# Patient Record
Sex: Female | Born: 1965 | Race: Black or African American | Hispanic: No | Smoking: Current some day smoker
Health system: Southern US, Community
[De-identification: ages and names within clinical notes are randomized; demographics above are authoritative.]

## PROBLEM LIST (undated history)

## (undated) DIAGNOSIS — F419 Anxiety disorder, unspecified: Secondary | ICD-10-CM

## (undated) DIAGNOSIS — I340 Nonrheumatic mitral (valve) insufficiency: Secondary | ICD-10-CM

## (undated) DIAGNOSIS — J45909 Unspecified asthma, uncomplicated: Secondary | ICD-10-CM

## (undated) DIAGNOSIS — F319 Bipolar disorder, unspecified: Secondary | ICD-10-CM

## (undated) DIAGNOSIS — M1990 Unspecified osteoarthritis, unspecified site: Secondary | ICD-10-CM

## (undated) DIAGNOSIS — K219 Gastro-esophageal reflux disease without esophagitis: Secondary | ICD-10-CM

## (undated) DIAGNOSIS — T1491XA Suicide attempt, initial encounter: Secondary | ICD-10-CM

## (undated) DIAGNOSIS — F329 Major depressive disorder, single episode, unspecified: Secondary | ICD-10-CM

## (undated) DIAGNOSIS — F32A Depression, unspecified: Secondary | ICD-10-CM

## (undated) DIAGNOSIS — E119 Type 2 diabetes mellitus without complications: Secondary | ICD-10-CM

## (undated) DIAGNOSIS — F4310 Post-traumatic stress disorder, unspecified: Secondary | ICD-10-CM

## (undated) DIAGNOSIS — I10 Essential (primary) hypertension: Secondary | ICD-10-CM

## (undated) DIAGNOSIS — F1910 Other psychoactive substance abuse, uncomplicated: Secondary | ICD-10-CM

## (undated) DIAGNOSIS — K259 Gastric ulcer, unspecified as acute or chronic, without hemorrhage or perforation: Secondary | ICD-10-CM

## (undated) DIAGNOSIS — T7840XA Allergy, unspecified, initial encounter: Secondary | ICD-10-CM

## (undated) DIAGNOSIS — F209 Schizophrenia, unspecified: Secondary | ICD-10-CM

## (undated) HISTORY — DX: Other psychoactive substance abuse, uncomplicated: F19.10

## (undated) HISTORY — DX: Allergy, unspecified, initial encounter: T78.40XA

## (undated) HISTORY — PX: CYST EXCISION: SHX5701

## (undated) HISTORY — PX: TUBAL LIGATION: SHX77

## (undated) HISTORY — PX: FOOT SURGERY: SHX648

## (undated) HISTORY — DX: Gastro-esophageal reflux disease without esophagitis: K21.9

## (undated) HISTORY — PX: DILATION AND CURETTAGE OF UTERUS: SHX78

---

## 2014-03-17 ENCOUNTER — Emergency Department (HOSPITAL_COMMUNITY): Payer: No Typology Code available for payment source

## 2014-03-17 ENCOUNTER — Emergency Department (HOSPITAL_COMMUNITY)
Admission: EM | Admit: 2014-03-17 | Discharge: 2014-03-18 | Disposition: A | Discharge status: Home / Self Care | Payer: No Typology Code available for payment source | Attending: Emergency Medicine | Admitting: Emergency Medicine

## 2014-03-17 ENCOUNTER — Encounter (HOSPITAL_COMMUNITY): Payer: Self-pay | Admitting: Emergency Medicine

## 2014-03-17 DIAGNOSIS — Y92410 Unspecified street and highway as the place of occurrence of the external cause: Secondary | ICD-10-CM | POA: Insufficient documentation

## 2014-03-17 DIAGNOSIS — J45909 Unspecified asthma, uncomplicated: Secondary | ICD-10-CM | POA: Insufficient documentation

## 2014-03-17 DIAGNOSIS — F17200 Nicotine dependence, unspecified, uncomplicated: Secondary | ICD-10-CM | POA: Insufficient documentation

## 2014-03-17 DIAGNOSIS — S40012A Contusion of left shoulder, initial encounter: Secondary | ICD-10-CM

## 2014-03-17 DIAGNOSIS — Y9389 Activity, other specified: Secondary | ICD-10-CM | POA: Insufficient documentation

## 2014-03-17 DIAGNOSIS — S40019A Contusion of unspecified shoulder, initial encounter: Secondary | ICD-10-CM | POA: Insufficient documentation

## 2014-03-17 DIAGNOSIS — Z8659 Personal history of other mental and behavioral disorders: Secondary | ICD-10-CM | POA: Insufficient documentation

## 2014-03-17 DIAGNOSIS — IMO0002 Reserved for concepts with insufficient information to code with codable children: Secondary | ICD-10-CM | POA: Insufficient documentation

## 2014-03-17 HISTORY — DX: Bipolar disorder, unspecified: F31.9

## 2014-03-17 HISTORY — DX: Post-traumatic stress disorder, unspecified: F43.10

## 2014-03-17 HISTORY — DX: Unspecified asthma, uncomplicated: J45.909

## 2014-03-17 IMAGING — CR DG SHOULDER 2+V*L*
4 series · 4 of 4 positions shown · non-contrast
Comparison: None.

CLINICAL DATA: Anterior shoulder pain

EXAM:
LEFT SHOULDER - 2+ VIEW

[w shoulder internal left]
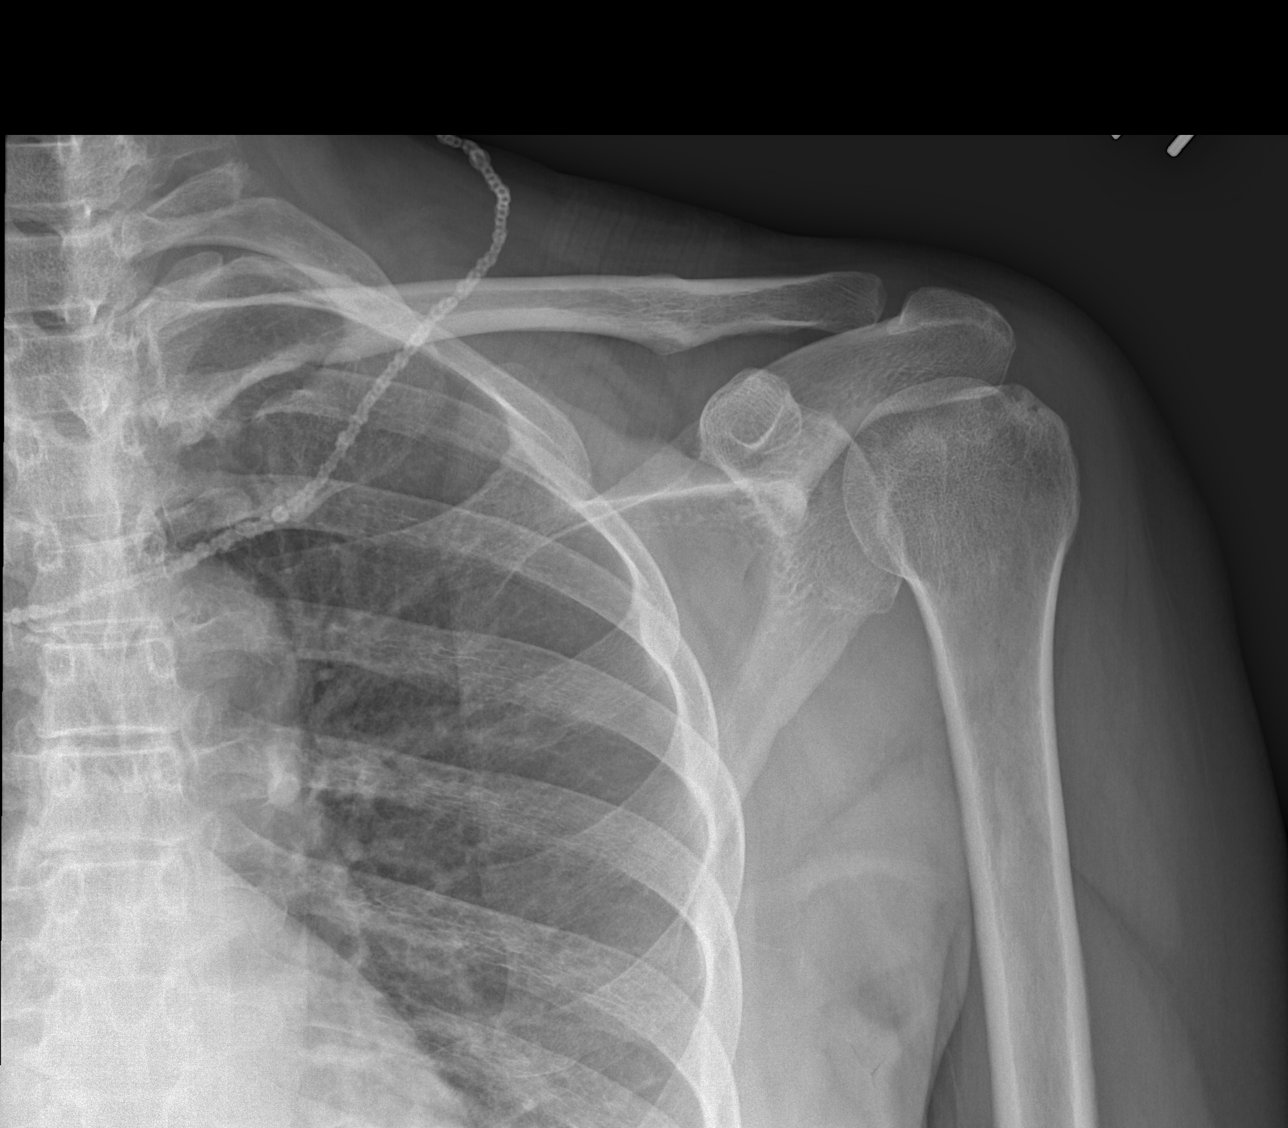

[w shoulder external left]
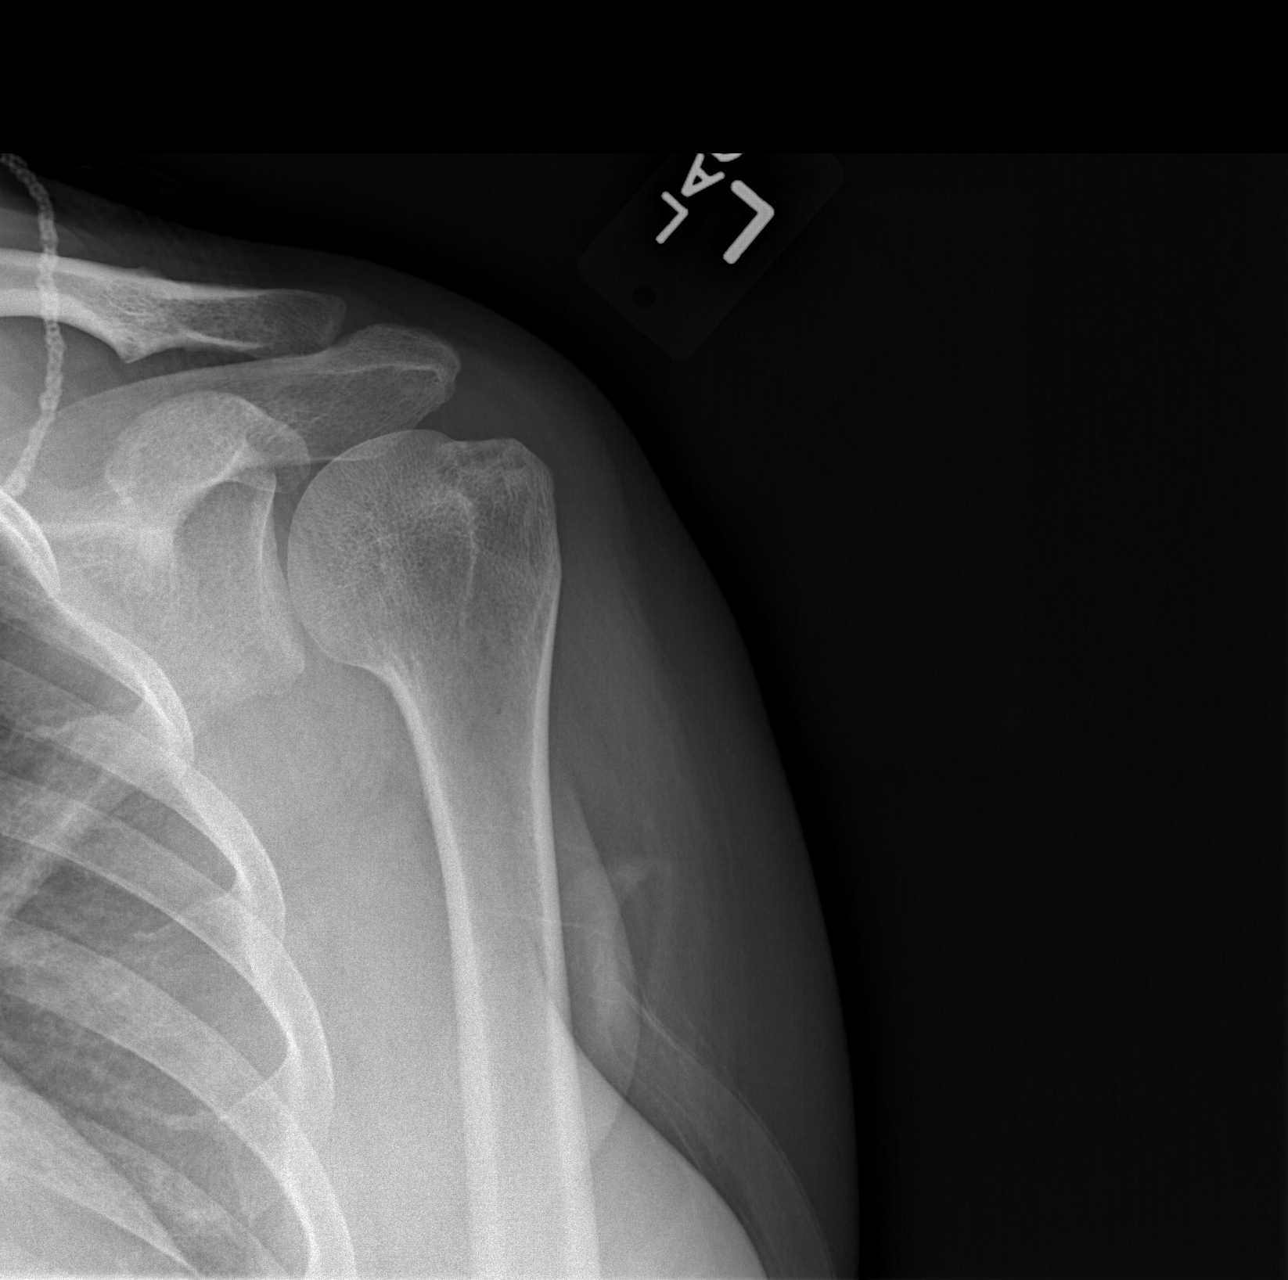

[w shoulder y-view left]
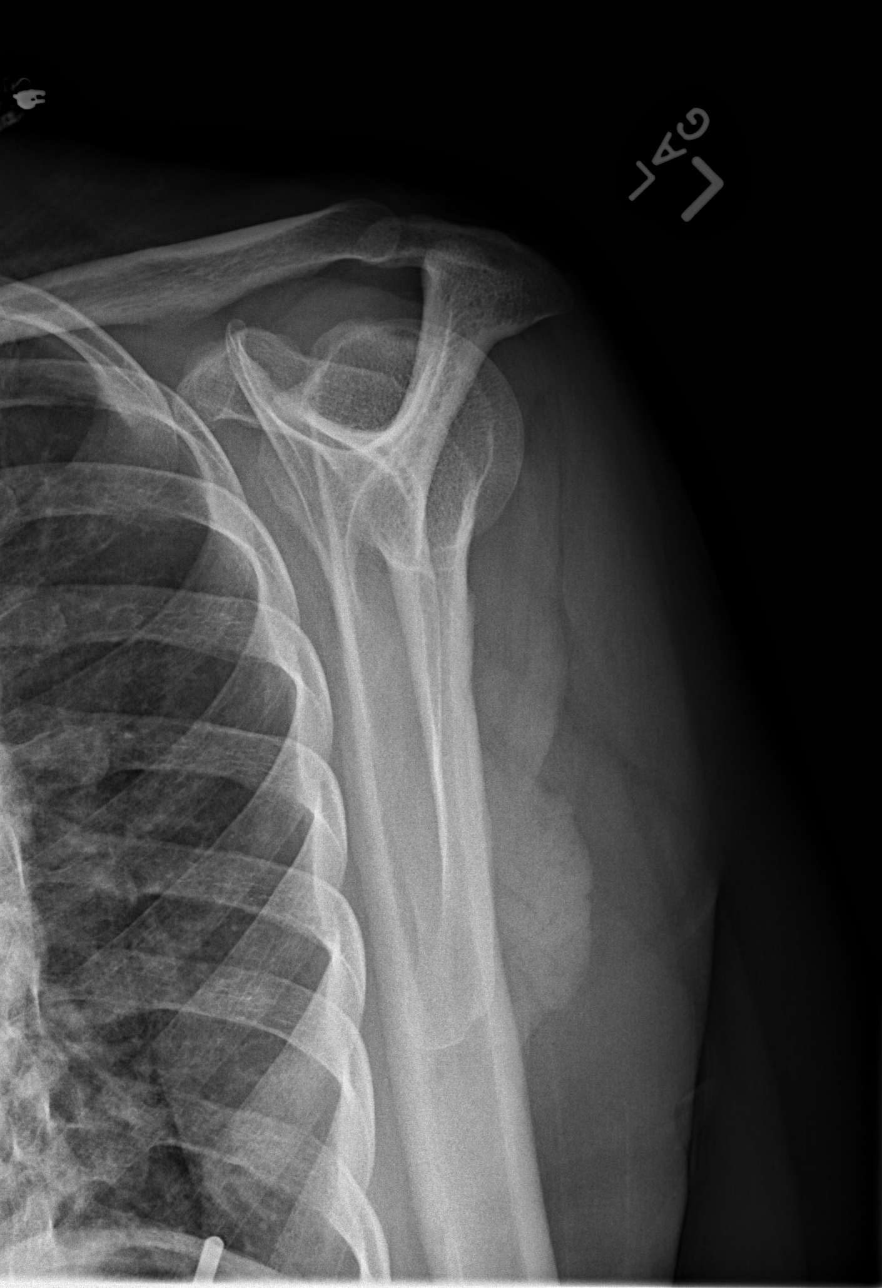

[x shoulder axillary left]
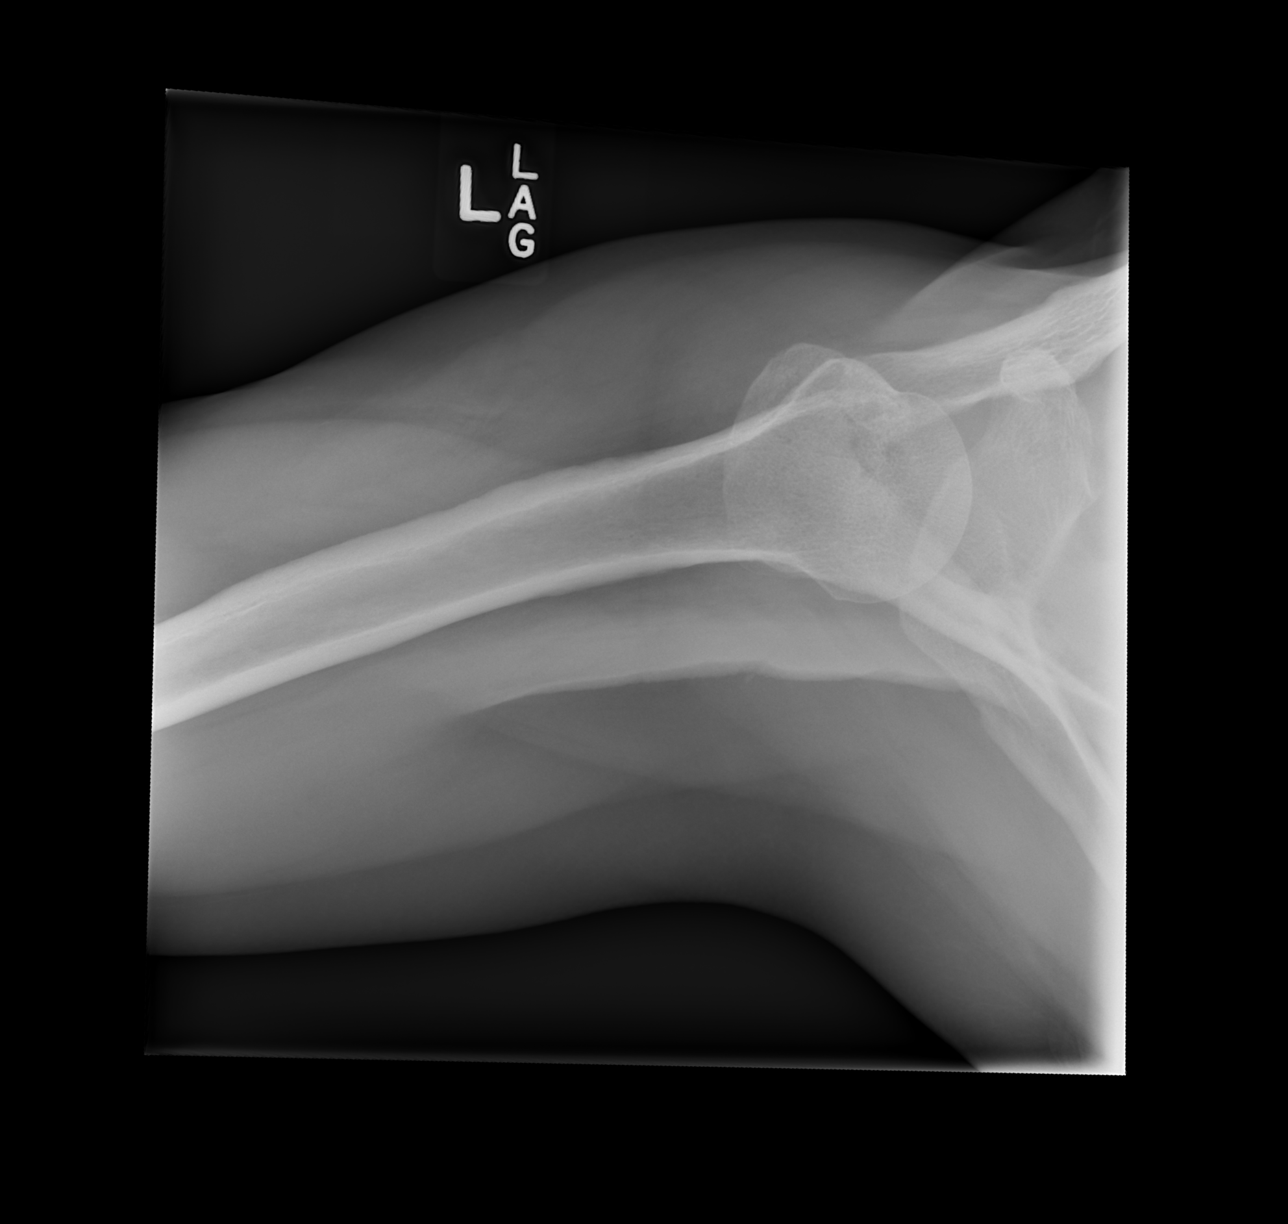

[4 of 4 positions shown; findings below may reference images not displayed]

FINDINGS: There is no evidence of fracture or dislocation. There is no
evidence of arthropathy or other focal bone abnormality. Soft
tissues are unremarkable.
IMPRESSION: No acute osseous injury of the left shoulder.

## 2014-03-17 MED ORDER — IBUPROFEN 800 MG PO TABS
800.0000 mg | ORAL_TABLET | Freq: Once | ORAL | Status: AC
  Administered 2014-03-17: 800 mg via ORAL
  Filled 2014-03-17: qty 1
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
## 2014-03-18 NOTE — Discharge Instructions (Signed)
Your x-rays did not show any signs of broken bones or other concerning injury. Rest her arm. Followup with a primary care provider for continued evaluation and treatment.    Contusion A contusion is a deep bruise. Contusions are the result of an injury that caused bleeding under the skin. The contusion may turn blue, purple, or yellow. Minor injuries will give you a painless contusion, but more severe contusions may stay painful and swollen for a few weeks.  CAUSES  A contusion is usually caused by a blow, trauma, or direct force to an area of the body. SYMPTOMS   Swelling and redness of the injured area.  Bruising of the injured area.  Tenderness and soreness of the injured area.  Pain. DIAGNOSIS  The diagnosis can be made by taking a history and physical exam. An X-ray, CT scan, or MRI may be needed to determine if there were any associated injuries, such as fractures. TREATMENT  Specific treatment will depend on what area of the body was injured. In general, the best treatment for a contusion is resting, icing, elevating, and applying cold compresses to the injured area. Over-the-counter medicines may also be recommended for pain control. Ask your caregiver what the best treatment is for your contusion. HOME CARE INSTRUCTIONS   Put ice on the injured area.  Put ice in a plastic bag.  Place a towel between your skin and the bag.  Leave the ice on for 15-20 minutes, 03-04 times a day.  Only take over-the-counter or prescription medicines for pain, discomfort, or fever as directed by your caregiver. Your caregiver may recommend avoiding anti-inflammatory medicines (aspirin, ibuprofen, and naproxen) for 48 hours because these medicines may increase bruising.  Rest the injured area.  If possible, elevate the injured area to reduce swelling. SEEK IMMEDIATE MEDICAL CARE IF:   You have increased bruising or swelling.  You have pain that is getting worse.  Your swelling or pain is  not relieved with medicines. MAKE SURE YOU:   Understand these instructions.  Will watch your condition.  Will get help right away if you are not doing well or get worse. Document Released: 08/07/2005 Document Revised: 01/20/2012 Document Reviewed: 09/02/2011 [facility] Patient Information 2014 [place], [place].      Motor Vehicle Collision  It is common to have multiple bruises and sore muscles after a motor vehicle collision (MVC). These tend to feel worse for the first 24 hours. You may have the most stiffness and soreness over the first several hours. You may also feel worse when you wake up the first morning after your collision. After this point, you will usually begin to improve with each day. The speed of improvement often depends on the severity of the collision, the number of injuries, and the location and nature of these injuries. HOME CARE INSTRUCTIONS   Put ice on the injured area.  Put ice in a plastic bag.  Place a towel between your skin and the bag.  Leave the ice on for 15-20 minutes, 03-04 times a day.  Drink enough fluids to keep your urine clear or pale yellow. Do not drink alcohol.  Take a warm shower or bath once or twice a day. This will increase blood flow to sore muscles.  You may return to activities as directed by your caregiver. Be careful when lifting, as this may aggravate neck or back pain.  Only take over-the-counter or prescription medicines for pain, discomfort, or fever as directed by your caregiver. Do not use  aspirin. This may increase bruising and bleeding. SEEK IMMEDIATE MEDICAL CARE IF:  You have numbness, tingling, or weakness in the arms or legs.  You develop severe headaches not relieved with medicine.  You have severe neck pain, especially tenderness in the middle of the back of your neck.  You have changes in bowel or bladder control.  There is increasing pain in any area of the body.  You have shortness of breath,  lightheadedness, dizziness, or fainting.  You have chest pain.  You feel sick to your stomach (nauseous), throw up (vomit), or sweat.  You have increasing abdominal discomfort.  There is blood in your urine, stool, or vomit.  You have pain in your shoulder (shoulder strap areas).  You feel your symptoms are getting worse. MAKE SURE YOU:   Understand these instructions.  Will watch your condition.  Will get help right away if you are not doing well or get worse. Document Released: 10/28/2005 Document Revised: 01/20/2012 Document Reviewed: 03/27/2011 [facility] Patient Information 2014 [place], [place].

## 2014-03-19 NOTE — ED Provider Notes (Signed)
Medical screening examination/treatment/procedure(s) were performed by non-physician practitioner and as supervising physician I was immediately available for consultation/collaboration.  Richarda Blade, MD 03/19/14 [phone]

## 2014-08-14 ENCOUNTER — Emergency Department (HOSPITAL_COMMUNITY)
Admission: EM | Admit: 2014-08-14 | Discharge: 2014-08-14 | Disposition: A | Discharge status: Home / Self Care | Payer: Self-pay | Attending: Emergency Medicine | Admitting: Emergency Medicine

## 2014-08-14 ENCOUNTER — Emergency Department (HOSPITAL_COMMUNITY): Payer: No Typology Code available for payment source

## 2014-08-14 ENCOUNTER — Encounter (HOSPITAL_COMMUNITY): Payer: Self-pay | Admitting: Emergency Medicine

## 2014-08-14 DIAGNOSIS — Y9289 Other specified places as the place of occurrence of the external cause: Secondary | ICD-10-CM | POA: Insufficient documentation

## 2014-08-14 DIAGNOSIS — F319 Bipolar disorder, unspecified: Secondary | ICD-10-CM | POA: Insufficient documentation

## 2014-08-14 DIAGNOSIS — S92401A Displaced unspecified fracture of right great toe, initial encounter for closed fracture: Secondary | ICD-10-CM

## 2014-08-14 DIAGNOSIS — S92421A Displaced fracture of distal phalanx of right great toe, initial encounter for closed fracture: Secondary | ICD-10-CM | POA: Insufficient documentation

## 2014-08-14 DIAGNOSIS — Z79899 Other long term (current) drug therapy: Secondary | ICD-10-CM | POA: Insufficient documentation

## 2014-08-14 DIAGNOSIS — J45909 Unspecified asthma, uncomplicated: Secondary | ICD-10-CM | POA: Insufficient documentation

## 2014-08-14 DIAGNOSIS — W2201XA Walked into wall, initial encounter: Secondary | ICD-10-CM | POA: Insufficient documentation

## 2014-08-14 DIAGNOSIS — Y9389 Activity, other specified: Secondary | ICD-10-CM | POA: Insufficient documentation

## 2014-08-14 DIAGNOSIS — Z720 Tobacco use: Secondary | ICD-10-CM | POA: Insufficient documentation

## 2014-08-14 MED ORDER — OXYCODONE-ACETAMINOPHEN 5-325 MG PO TABS: 1.0000 | ORAL_TABLET | Freq: Four times a day (QID) | ORAL | Status: DC | PRN

## 2014-08-14 MED ORDER — IBUPROFEN 800 MG PO TABS: 800.0000 mg | ORAL_TABLET | Freq: Three times a day (TID) | ORAL | Status: DC

## 2014-08-14 MED ORDER — OXYCODONE-ACETAMINOPHEN 5-325 MG PO TABS
1.0000 | ORAL_TABLET | Freq: Once | ORAL | Status: AC
  Administered 2014-08-14: 1 via ORAL
  Filled 2014-08-14: qty 1

## 2014-08-14 MED ORDER — ONDANSETRON 4 MG PO TBDP
4.0000 mg | ORAL_TABLET | Freq: Once | ORAL | Status: AC
  Administered 2014-08-14: 4 mg via ORAL
  Filled 2014-08-14: qty 1

## 2014-08-14 NOTE — Discharge Instructions (Signed)

## 2014-08-14 NOTE — ED Notes (Signed)
Pt reports her daughter will be driving her home

## 2014-08-14 NOTE — ED Notes (Signed)
Pt reports she was playing with her grandkids when she hit her right toe (Hallux) on the corner of the wall. No bruising or swelling noted, +sensation and she is able to wiggle her toes.

## 2014-08-14 NOTE — ED Provider Notes (Signed)
CSN: [id number]     Arrival date & time 08/14/14  2106 History  This chart was scribed for non-physician practitioner, Randa Lynn. Carlota Raspberry, PA-C working with Malvin Johns, MD by Tula Nakayama, ED scribe. This patient was seen in room TR05C/TR05C and the patient's care was started at 9:28 PM.  Chief Complaint  Patient presents with  . Toe Injury   The history is provided by the patient. No language interpreter was used.   HPI Comments: Abuk Selleck is a 48 y.o. female who presents to the Emergency Department complaining of severe, dorsal right great toe pain with sharp, intermittent stabbing sensations after hitting it on the corner of a wall while playing with her grandchildren 2 hours ago. Pt notes not being able to flex her right great toe. She states a tingling sensation at the very tip of great toe as an associated symptom.    Past Medical History  Diagnosis Date  . Asthma   . Post traumatic stress disorder   . Bipolar 1 disorder    History reviewed. No pertinent past surgical history. No family history on file. History  Substance Use Topics  . Smoking status: Current Every Day Smoker  . Smokeless tobacco: Not on file  . Alcohol Use: Yes   OB History   Grav Para Term Preterm Abortions TAB SAB Ect Mult Living                 Review of Systems  All other systems reviewed and are negative.     Allergies  Aspirin and Other  Home Medications   Prior to Admission medications   Medication Sig Start Date End Date Taking? Authorizing Provider  albuterol (PROVENTIL HFA;VENTOLIN HFA) 108 (90 BASE) MCG/ACT inhaler Inhale 2 puffs into the lungs every 6 (six) hours as needed for wheezing or shortness of breath.    Historical Provider, MD  hydrOXYzine (ATARAX/VISTARIL) 50 MG tablet Take 50 mg by mouth 3 (three) times daily as needed.    Historical Provider, MD  ibuprofen (ADVIL,MOTRIN) 800 MG tablet Take 1 tablet (800 mg total) by mouth 3 (three) times daily. 08/14/14   Linus Mako, PA-C  lithium carbonate 300 MG capsule Take 900 mg by mouth at bedtime.    Historical Provider, MD  mometasone (NASONEX) 50 MCG/ACT nasal spray Place 1 spray into both nostrils daily.    Historical Provider, MD  oxyCODONE-acetaminophen (PERCOCET/ROXICET) 5-325 MG per tablet Take 1-2 tablets by mouth every 6 (six) hours as needed. 08/14/14   Ruben Mahler Marilu Favre, PA-C  traZODone (DESYREL) 100 MG tablet Take 200 mg by mouth at bedtime.    Historical Provider, MD   BP 108/83  Pulse 98  Temp(Src) 99 F (37.2 C) (Oral)  Resp 18  SpO2 95% Physical Exam  Nursing note and vitals reviewed. Constitutional: She appears well-developed and well-nourished. No distress.  HENT:  Head: Normocephalic and atraumatic.  Eyes: Conjunctivae and EOM are normal.  Neck: Neck supple. No tracheal deviation present.  Cardiovascular: Normal rate.   Pulmonary/Chest: Effort normal. No respiratory distress.  Musculoskeletal:       Right foot: She exhibits decreased range of motion, tenderness, bony tenderness, swelling and crepitus. She exhibits normal capillary refill, no deformity and no laceration.       Feet:  Skin: Skin is warm and dry.  Psychiatric: She has a normal mood and affect. Her behavior is normal.    ED Course  Procedures (including critical care time) DIAGNOSTIC STUDIES:   COORDINATION OF  CARE: 3:20 PM Discussed treatment plan with pt at bedside and pt agreed to plan.    Labs Review Labs Reviewed - No data to display  Imaging Review No results found.   EKG Interpretation None          DG Toe Great Right (Final result)  Result time: 08/14/14 22:21:12    Final result by Rad Results In Interface (08/14/14 22:21:12)    Narrative:   CLINICAL DATA: Stubbed right great toe against wall today, with diffuse throbbing pain in the right great toe, worse about the distal aspect of the toe. Acute injury; initial encounter.  EXAM: RIGHT GREAT TOE  COMPARISON:  None.  FINDINGS: There is a nearly nondisplaced fracture through the proximal aspect of the first distal phalanx, without definite evidence of intra-articular extension. No additional fractures are identified. Visualized joint spaces are preserved. Mild soft tissue swelling is suggested about the first toe.  IMPRESSION: Nearly nondisplaced fracture noted through the proximal aspect of the first distal phalanx, without definite evidence of intra-articular extension.   Electronically Signed By: Garald Balding M.D. On: 08/14/2014 22:21     MDM   Final diagnoses:  Fracture of great toe, right, closed, initial encounter   Patient to the ER and has sustained a great toe fracture. Discussed with attending and we feel that a post op boot would provider the greatest amount of support. I discussed the importance of following up with the Orthopedist that she will be referred to has this is a weight baring bone and the need to decrease complications is important.  48 y.o.Deva Archer's evaluation in the Emergency Department is complete. It has been determined that no acute conditions requiring further emergency intervention are present at this time. The patient/guardian have been advised of the diagnosis and plan. We have discussed signs and symptoms that warrant return to the ED, such as changes or worsening in symptoms.  Vital signs are stable at discharge. Filed Vitals:   08/14/14 2301  BP: 108/83  Pulse: 98  Temp:   Resp: 18    Patient/guardian has voiced understanding and agreed to follow-up with the PCP or specialist.   I personally performed the services described in this documentation, which was scribed in my presence. The recorded information has been reviewed and is accurate.     Linus Mako, PA-C 08/25/14 1523

## 2014-08-26 NOTE — ED Provider Notes (Signed)
Medical screening examination/treatment/procedure(s) were performed by non-physician practitioner and as supervising physician I was immediately available for consultation/collaboration.   EKG Interpretation None        Malvin Johns, MD 08/26/14 [phone]

## 2015-02-27 ENCOUNTER — Encounter (HOSPITAL_COMMUNITY): Payer: Self-pay

## 2015-02-27 ENCOUNTER — Emergency Department (HOSPITAL_COMMUNITY)
Admission: EM | Admit: 2015-02-27 | Discharge: 2015-02-27 | Disposition: A | Discharge status: Home / Self Care | Payer: Self-pay | Attending: Emergency Medicine | Admitting: Emergency Medicine

## 2015-02-27 DIAGNOSIS — R21 Rash and other nonspecific skin eruption: Secondary | ICD-10-CM

## 2015-02-27 DIAGNOSIS — L918 Other hypertrophic disorders of the skin: Secondary | ICD-10-CM | POA: Insufficient documentation

## 2015-02-27 DIAGNOSIS — Z720 Tobacco use: Secondary | ICD-10-CM | POA: Insufficient documentation

## 2015-02-27 DIAGNOSIS — Q828 Other specified congenital malformations of skin: Secondary | ICD-10-CM

## 2015-02-27 DIAGNOSIS — J45909 Unspecified asthma, uncomplicated: Secondary | ICD-10-CM | POA: Insufficient documentation

## 2015-02-27 DIAGNOSIS — Z7951 Long term (current) use of inhaled steroids: Secondary | ICD-10-CM | POA: Insufficient documentation

## 2015-02-27 DIAGNOSIS — L739 Follicular disorder, unspecified: Secondary | ICD-10-CM | POA: Insufficient documentation

## 2015-02-27 DIAGNOSIS — F319 Bipolar disorder, unspecified: Secondary | ICD-10-CM | POA: Insufficient documentation

## 2015-02-27 DIAGNOSIS — Z79899 Other long term (current) drug therapy: Secondary | ICD-10-CM | POA: Insufficient documentation

## 2015-02-27 MED ORDER — CEPHALEXIN 500 MG PO CAPS: 500.0000 mg | ORAL_CAPSULE | Freq: Four times a day (QID) | ORAL | Status: DC

## 2015-02-27 MED ORDER — DIPHENHYDRAMINE HCL 25 MG PO TABS: 25.0000 mg | ORAL_TABLET | Freq: Three times a day (TID) | ORAL | Status: DC | PRN

## 2015-02-27 NOTE — ED Notes (Signed)
Pt started 1 week ago with bilateral axillary irritation. Woke this am with worsening irritation and swelling.

## 2015-02-27 NOTE — ED Notes (Signed)
Pt has noticed pain and burning to bilateral axilla for about a week now. No hx of abscesses.

## 2015-02-27 NOTE — ED Provider Notes (Signed)
CSN: [id number]     Arrival date & time 02/27/15  1142 History  This chart was scribed for Jamse Mead PA-C working with Wandra Arthurs, MD by Mercy Moore, ED Scribe. This patient was seen in room TR01C/TR01C and the patient's care was started at 1:33 PM.   Chief Complaint  Patient presents with  . Abscess   The history is provided by the patient. No language interpreter was used.   HPI Comments: Denise Knight is a 49 y.o. female with past medical history of asthma, PTSD, bipolar disorder who presents to the Emergency Department complaining of burning, itching left axillary pain ongoing for one week now. Patient reports sweaty discharge and pus from the site upon waking up this morning. Patient denies history of abscess. Secondarily patient reports two painful skin tags to her right axilla. Patient reports family history of these benign skin tags. Patient reports treatment to both regions with peroxide and cortisone cream, without relief. Patient reports shaving her armpits once a month. Denied fever, neck pain, neck stiffness, travels, chest pain, shortness of breath, difficulty breathing, headache, dizziness, fainting, red streaks, arm swelling, throat closing sensation. Denied history of skin cancer in the family. Patient do not have PCP here in [place], but she travels to see her doctor in her hometown.   Past Medical History  Diagnosis Date  . Asthma   . Post traumatic stress disorder   . Bipolar 1 disorder    History reviewed. No pertinent past surgical history. No family history on file. History  Substance Use Topics  . Smoking status: Current Every Day Smoker  . Smokeless tobacco: Not on file  . Alcohol Use: Yes   OB History    No data available     Review of Systems  Constitutional: Negative for fever and chills.  Gastrointestinal: Negative for nausea, vomiting and abdominal pain.  Skin: Positive for rash.       Skin tags  Neurological: Negative for syncope.       Allergies  Aspirin and Other  Home Medications   Prior to Admission medications   Medication Sig Start Date End Date Taking? Authorizing Provider  albuterol (PROVENTIL HFA;VENTOLIN HFA) 108 (90 BASE) MCG/ACT inhaler Inhale 2 puffs into the lungs every 6 (six) hours as needed for wheezing or shortness of breath.    Historical Provider, MD  cephALEXin (KEFLEX) 500 MG capsule Take 1 capsule (500 mg total) by mouth 4 (four) times daily. 02/27/15   Shivonne Schwartzman, PA-C  diphenhydrAMINE (BENADRYL) 25 MG tablet Take 1 tablet (25 mg total) by mouth every 8 (eight) hours as needed for itching. 02/27/15   Gerrald Basu, PA-C  hydrOXYzine (ATARAX/VISTARIL) 50 MG tablet Take 50 mg by mouth 3 (three) times daily as needed.    Historical Provider, MD  ibuprofen (ADVIL,MOTRIN) 800 MG tablet Take 1 tablet (800 mg total) by mouth 3 (three) times daily. 08/14/14   Delos Haring, PA-C  lithium carbonate 300 MG capsule Take 900 mg by mouth at bedtime.    Historical Provider, MD  mometasone (NASONEX) 50 MCG/ACT nasal spray Place 1 spray into both nostrils daily.    Historical Provider, MD  oxyCODONE-acetaminophen (PERCOCET/ROXICET) 5-325 MG per tablet Take 1-2 tablets by mouth every 6 (six) hours as needed. 08/14/14   Tiffany Carlota Raspberry, PA-C  traZODone (DESYREL) 100 MG tablet Take 200 mg by mouth at bedtime.    Historical Provider, MD   Triage Vitals: BP 147/84 mmHg  Pulse 89  Temp(Src) 97.4 F (36.3 C) (  Oral)  Resp 20  Ht 5' 9"  (1.753 m)  Wt 187 lb (84.823 kg)  BMI 27.60 kg/m2  SpO2 99% Physical Exam  Constitutional: She is oriented to person, place, and time. She appears well-developed and well-nourished. No distress.  HENT:  Head: Normocephalic and atraumatic.  Mouth/Throat: Oropharynx is clear and moist. No oropharyngeal exudate.  Eyes: Conjunctivae and EOM are normal. Pupils are equal, round, and reactive to light. Right eye exhibits no discharge. Left eye exhibits no discharge.  Neck: Normal  range of motion. Neck supple. No tracheal deviation present.  Cardiovascular: Normal rate, regular rhythm and normal heart sounds.  Exam reveals no friction rub.   No murmur heard. Pulses:      Radial pulses are 2+ on the right side, and 2+ on the left side.  Pulmonary/Chest: Effort normal and breath sounds normal. No respiratory distress. She has no wheezes. She has no rales.  Patient stable to speak in full sentences that difficulty Negative use of accessory muscles Negative stridor  Musculoskeletal: Normal range of motion.  Full ROM to upper and lower extremities without difficulty noted, negative ataxia noted.  Lymphadenopathy:    She has no cervical adenopathy.  Neurological: She is alert and oriented to person, place, and time. No cranial nerve deficit. She exhibits normal muscle tone. Coordination normal.  Equal grip strength bilaterally  Skin: Skin is warm and dry. Rash noted. She is not diaphoretic. There is erythema.  Axilla bilaterally identified to be mildly erythematous with escortion of the skin secondary for patient itching. Clear fluid identified. Negative fluctuance or induration identified. Negative active drainage of pus noted. Negative active drainage of bleeding. Negative red streaks running down the arm. Negative arm swelling noted.  Psychiatric: She has a normal mood and affect. Her behavior is normal. Thought content normal.  Nursing note and vitals reviewed.   ED Course  Procedures (including critical care time)  COORDINATION OF CARE: 1:43 PM- Discussed treatment plan with patient at bedside and patient agreed to plan.   Labs Review Labs Reviewed - No data to display  Imaging Review No results found.   EKG Interpretation None      MDM   Final diagnoses:  Folliculitis  Rash and nonspecific skin eruption  Accessory skin tags    Medications - No data to display  Filed Vitals:   02/27/15 1158  BP: 147/84  Pulse: 89  Temp: 97.4 F (36.3 C)   TempSrc: Oral  Resp: 20  Height: 5' 9"  (1.753 m)  Weight: 187 lb (84.823 kg)  SpO2: 99%   I personally performed the services described in this documentation, which was scribed in my presence. The recorded information has been reviewed and is accurate.  Patient presenting to the ED with bilateral axilla rash that started approximately one week ago. Rash is localized to the axillas bilaterally identified to be an erythematous face with a score of the skin secondary to the patient itching. Patient reports that the skin is burning, itching is been continuous. Negative active drainage or bleeding noted at this time. Negative palpation of induration or fluctuance. No appreciable abscess noted be drained at this time. Definitive etiology unknown, possible bacterial versus fungal infection. Negative signs of lymphangitis. Patient stable, afebrile. Patient not septic appearing. Negative signs of respiratory distress. Doubt anaphylactic reaction. Suspicion to be possible folliculitis, will treat patient with antibiotic first-discussed with patient that if antibiotics do not work this may be fungal and to report back to the ED for assessment. Discharged  patient. Discharge patient with Keflex. Discharge patient with a small dose of Benadryl for itching, discussed with patient course and precautions-discussed with patient that she cannot take Benadryl with Vistaril, patient understood and comprehended. Discussed with patient to rest and stay hydrated. Discussed with patient to avoid any physical or strenuous activity. Discussed with patient to apply cool compressions. Discussed with patient to avoid using peroxide, recommended non-scented powder to keep the site dry. Referred patient to health and wellness Center and dermatologist-discussed with patient and highly recommended patient to be seen by dermatologist regarding skin tags, recommended biopsy to be performed on the skin tags. Discussed with patient to closely  monitor symptoms and if symptoms are to worsen or change to report back to the ED - strict return instructions given.  Patient agreed to plan of care, understood, all questions answered.   Jamse Mead, PA-C 02/27/15 1434  Wandra Arthurs, MD 02/27/15 [phone]

## 2015-02-27 NOTE — Discharge Instructions (Signed)
Please call your doctor for a followup appointment within 24-48 hours. When you talk to your doctor please let them know that you were seen in the emergency department and have them acquire all of your records so that they can discuss the findings with you and formulate a treatment plan to fully care for your new and ongoing problems. Please follow-up with health and wellness Center Please follow-up with dermatologist regarding skin tags in the armpits-highly recommended assessment and possible biopsy of these skin tags Please take antibiotics as prescribed While taking Benadryl this can cause drowsiness, please do not drink alcohol, drive, operate any heavy machinery while taking Benadryl. Please no more than 100 mg per day. While taking Benadryl you CANNOT take Vistaril Please keep site dry Please apply cool compressions for comfort purposes Please continue to monitor symptoms closely and if symptoms are to worsen or change (fever greater than 101, chills, sweating, nausea, vomiting, chest pain, shortness of breathe, difficulty breathing, weakness, numbness, tingling, worsening or changes to pain pattern, drainage, abscess formation, increased swelling to the arms, red streaks running down the arms, loss of sensation to the arms, weakness, tongue swelling, throat closing sensation, neck pain, neck stiffness) please report back to the Emergency Department immediately.   Folliculitis  Folliculitis is redness, soreness, and swelling (inflammation) of the hair follicles. This condition can occur anywhere on the body. People with weakened immune systems, diabetes, or obesity have a greater risk of getting folliculitis. CAUSES  Bacterial infection. This is the most common cause.  Fungal infection.  Viral infection.  Contact with certain chemicals, especially oils and tars. Long-term folliculitis can result from bacteria that live in the nostrils. The bacteria may trigger multiple outbreaks of  folliculitis over time. SYMPTOMS Folliculitis most commonly occurs on the scalp, thighs, legs, back, buttocks, and areas where hair is shaved frequently. An early sign of folliculitis is a small, white or yellow, pus-filled, itchy lesion (pustule). These lesions appear on a red, inflamed follicle. They are usually less than 0.2 inches (5 mm) wide. When there is an infection of the follicle that goes deeper, it becomes a boil or furuncle. A group of closely packed boils creates a larger lesion (carbuncle). Carbuncles tend to occur in hairy, sweaty areas of the body. DIAGNOSIS  Your caregiver can usually tell what is wrong by doing a physical exam. A sample may be taken from one of the lesions and tested in a lab. This can help determine what is causing your folliculitis. TREATMENT  Treatment may include:  Applying warm compresses to the affected areas.  Taking antibiotic medicines orally or applying them to the skin.  Draining the lesions if they contain a large amount of pus or fluid.  Laser hair removal for cases of long-lasting folliculitis. This helps to prevent regrowth of the hair. HOME CARE INSTRUCTIONS  Apply warm compresses to the affected areas as directed by your caregiver.  If antibiotics are prescribed, take them as directed. Finish them even if you start to feel better.  You may take over-the-counter medicines to relieve itching.  Do not shave irritated skin.  Follow up with your caregiver as directed. SEEK IMMEDIATE MEDICAL CARE IF:   You have increasing redness, swelling, or pain in the affected area.  You have a fever. MAKE SURE YOU:  Understand these instructions.  Will watch your condition.  Will get help right away if you are not doing well or get worse. Document Released: 01/06/2002 Document Revised: 04/28/2012 Document Reviewed: 01/28/2012 ExitCare  Patient Information 2015 [place]. This information is not intended to replace advice given to you by  your health care provider. Make sure you discuss any questions you have with your health care provider.   Emergency Department Resource Guide 1) Find a Doctor and Pay Out of Pocket Although you won't have to find out who is covered by your insurance plan, it is a good idea to ask around and get recommendations. You will then need to call the office and see if the doctor you have chosen will accept you as a new patient and what types of options they offer for patients who are self-pay. Some doctors offer discounts or will set up payment plans for their patients who do not have insurance, but you will need to ask so you aren't surprised when you get to your appointment.  2) Contact Your Local Health Department Not all health departments have doctors that can see patients for sick visits, but many do, so it is worth a call to see if yours does. If you don't know where your local health department is, you can check in your phone book. The CDC also has a tool to help you locate your state's health department, and many state websites also have listings of all of their local health departments.  3) Find a [facility] If your illness is not likely to be very severe or complicated, you may want to try a walk in clinic. These are popping up all over the country in pharmacies, drugstores, and shopping centers. They're usually staffed by nurse practitioners or physician assistants that have been trained to treat common illnesses and complaints. They're usually fairly quick and inexpensive. However, if you have serious medical issues or chronic medical problems, these are probably not your best option.  No Primary Care Doctor: - Call Health Connect at  [phone] - they can help you locate a primary care doctor that  accepts your insurance, provides certain services, etc. - Physician Referral Service- [phone]  Chronic Pain Problems: Organization         Address  Phone   Notes  [facility]  [phone] Patients need to be referred by their primary care doctor.   Medication Assistance: Organization         Address  Phone   Notes  [facility] Medication [facility] [address] [place], [place] [address] [phone] --Must be a resident of [facility] -- Must have NO insurance coverage whatsoever (no Medicaid/ Medicare, etc.) -- The pt. MUST have a primary care doctor that directs their care regularly and follows them in the community   MedAssist  [phone]   [employer]  [phone]    Agencies that provide inexpensive medical care: Organization         Address  Phone   Notes  [place]  [phone]   [facility]    [phone]   [facility] [place], [place] [address] [phone]   [place] [address] [place] [phone]   Planned Parenthood    [phone]   [facility]    [phone]   [place] and [place] [address], [place] Phone:  [phone], Fax:  [phone] Hours of Operation:  9 am - 6 pm, M-F.  Also accepts Medicaid/Medicare and self-pay.  [facility]  for Children  [address], [place] Phone: [phone], Fax: [phone]. Hours of Operation:  8:30 am - 5:30 pm, M-F.  Also accepts Medicaid and self-pay.  [facility] [address] [place] Phone: [phone]   [place], [place], [place] [phone], Ext. [phone] Mondays & Thursdays: 7-9 AM.  First 15 patients are seen on a first come, first serve basis.    [place] Providers:  Organization         Address  Phone   Notes  [facility] [address],  [phone] Also accepts self-pay patients.  [facility] [address], [place]  [phone]   [place], [address], [place] [phone]   [facility] [address] [place] [phone]   Lucianne Lei [address], [place]   [phone] Only accepts [place] Access [place] patients after they have their name applied to their card.   Self-Pay (no insurance) in [facility]:  Organization         Address  Phone   Notes  Sickle Cell Patients, [facility] Internal [facility] [place] [phone]   [facility] [place] [phone]   [facility]  [place], [place], [place] [phone]   [facility]/Dr. Osei-Bonsu  [address] [place] or [address], [place] [phone] Phone number for both [place] and [place] locations is the same.  Urgent Medical and [facility] [address], [place] [phone]   [facility] [address], [place] or [address] [phone]   [facility] [address], [place] [phone], phone; [phone], fax Sees patients 1st and 3rd Saturday of every month.  Must not qualify for public or private insurance (i.e. Medicaid, Medicare, [place] Health Choice, Veterans' Benefits)  Household income should be no more than 200% of the poverty level The clinic cannot treat you if you are pregnant or think you are pregnant  Sexually transmitted diseases are not treated at the clinic.    Dental Care: Organization         Address  Phone  Notes  [facility] of [facility] [place] [phone] Accepts children up to age 75 who are enrolled in [place] or [place]; pregnant women with a Medicaid card; and children who have applied for Medicaid or [place] Health  Choice, but were declined, whose parents can pay a reduced fee at time of service.  [facility] Department of [facility]  [address], [place] [phone] Accepts children up to age 72 who are enrolled in [place] or [place]; pregnant women with a Medicaid card; and children who have applied for Medicaid or [place] Health Choice, but were declined, whose parents can pay a reduced fee at time of service.  [place] Adult [facility] Access PROGRAM  [place] [phone] Patients are seen by appointment only. Walk-ins are not accepted. [place] will see patients 57 years of age and older. Monday - Tuesday (8am-5pm) Most Wednesdays (8:30-5pm) $30 per visit, cash only  [place] Adult [facility] Access PRO[facility]  [address], [facility] [phone] Patients are seen by appointment only. Walk-ins are not accepted. [place] will see patients 73 years of age and older. One Wednesday Evening (Monthly: Volunteer Based).  $30 per visit, cash only  [place]  [phone] for adults; Children under age 39, call Graduate Pediatric Dentistry at [phone]. Children aged 30-14, please call [phone] to request a pediatric application.  Dental services are provided in all areas of dental care including fillings, crowns and bridges, complete and partial dentures, implants, gum treatment, root canals, and extractions. Preventive care is also provided. Treatment is provided to both adults and children. Patients are selected via a lottery and there is often a waiting list.   [facility] [address] [place]  [phone] [email or web address]   [facility] [address] [place], [place] [phone], Ext. [phone] Second and Fourth Thursday of each month, opens at 6:30 AM; Clinic ends at 9 AM.  Patients are seen on a first-come first-served basis, and a limited number are seen during each  clinic.   [facility]  [address] Hillard Danker [place], [place] [phone]   Eligibility Requirements You must have lived in [place], [place], or [place] counties for at least the last three months.   You cannot be eligible for state or federal sponsored [employer], including [employer], [place], or [employer].   You generally cannot be eligible for healthcare insurance through your employer.    How to apply: Eligibility screenings are held every Tuesday and Wednesday afternoon from 1:00 pm until 4:00 pm. You do not need an appointment for the interview!  [facility] [address] [place], [place]   [place]  [place] Department  [place]  [phone]    Behavioral Health Resources in the Community: Intensive Outpatient Programs Organization         Address  Phone  Notes  [place]. [address], [place], [place] [phone]   [facility] Outpatient [address] [place], [place]   ADS: Alcohol & Drug Svcs [address], [place], [place]   [place] [address]  [place], [place] or [phone]   Substance Abuse Resources Organization         Address  Phone  Notes  Alcohol and Drug Services  [phone]   [place]  [phone]   [facility] [place]   Chinita Pester  [phone]   Residential & Outpatient Substance Abuse Program  [phone]   Psychological Services Organization         Address  Phone  Notes  [facility] [place]  [place]  [phone]   [place] [address] [place] [phone] or [phone]    Mobile Crisis Teams Organization         Address  Phone  Notes  [facility]rapeutic Alternatives, Mobile  Crisis Care Unit  [phone]   [facility]ive Psychotherapeutic Services  [address] [place], [place]   [place] [address] [place] [phone]    Self-Help/Support Groups Organization         Address  Phone             Notes  Mental Health Assoc. of [place] - variety of support groups  [place] Call for more information  Narcotics Anonymous (NA), [facility] [address], [employer] [place]  2 meetings at this location   Special educational needs teacher         Address  Phone  Notes  [facility] Residential Treatment [place],    [place]  [phone]   [facility]  [address], [place] [address], [place], [place]   [place], [place] [phone] Admissions: 8am-3pm M-F  Incentives Substance [place] [address],    [place], [place] [phone]   [facility] [address] [place], [place], [place]   [facility] [address],  [place], [place]   [facility] Programs - Intensive Outpatient [address] Kristeen [address], [place], [place]   [facility] ([place]  [place], [place] [phone] or [phone]   [facility] Services (RTS) [address], [place], [place] Accepts Medicaid  [facility] [place] [address],  [place] [phone] Substance Abuse/Addiction Treatment   [facility] Organization         Address  Phone  Notes  [facility]  [phone]   Domenic Schwab, PhD [address] [place]lis [place], [place]   [phone] or [phone]   [place], [place] [phone]   [facility] [phone], [place], [place] [phone] Insurance/Medicaid/sponsorship through [facility] and Families [address], [address]  [place]                                    [place], [place] [phone] [place] [address], [place] [phone]    Dr. Adele Schilder  [phone]   [facility] Dept. 1) [address] [place] 2) [place] 3)  [place] [address], [place] [phone]  [phone]   [place] [phone] or [phone] (After Hours)

## 2015-06-21 ENCOUNTER — Encounter (HOSPITAL_COMMUNITY): Payer: Self-pay | Admitting: Family Medicine

## 2015-06-21 ENCOUNTER — Emergency Department (HOSPITAL_COMMUNITY)
Admission: EM | Admit: 2015-06-21 | Discharge: 2015-06-21 | Disposition: A | Discharge status: Home / Self Care | Payer: Self-pay | Attending: Emergency Medicine | Admitting: Emergency Medicine

## 2015-06-21 ENCOUNTER — Emergency Department (HOSPITAL_COMMUNITY): Payer: Self-pay

## 2015-06-21 DIAGNOSIS — Z792 Long term (current) use of antibiotics: Secondary | ICD-10-CM | POA: Insufficient documentation

## 2015-06-21 DIAGNOSIS — F319 Bipolar disorder, unspecified: Secondary | ICD-10-CM | POA: Insufficient documentation

## 2015-06-21 DIAGNOSIS — J45909 Unspecified asthma, uncomplicated: Secondary | ICD-10-CM | POA: Insufficient documentation

## 2015-06-21 DIAGNOSIS — R6883 Chills (without fever): Secondary | ICD-10-CM | POA: Insufficient documentation

## 2015-06-21 DIAGNOSIS — Z791 Long term (current) use of non-steroidal anti-inflammatories (NSAID): Secondary | ICD-10-CM | POA: Insufficient documentation

## 2015-06-21 DIAGNOSIS — Z7951 Long term (current) use of inhaled steroids: Secondary | ICD-10-CM | POA: Insufficient documentation

## 2015-06-21 DIAGNOSIS — Z720 Tobacco use: Secondary | ICD-10-CM | POA: Insufficient documentation

## 2015-06-21 DIAGNOSIS — R1032 Left lower quadrant pain: Secondary | ICD-10-CM | POA: Insufficient documentation

## 2015-06-21 DIAGNOSIS — R110 Nausea: Secondary | ICD-10-CM | POA: Insufficient documentation

## 2015-06-21 DIAGNOSIS — R630 Anorexia: Secondary | ICD-10-CM | POA: Insufficient documentation

## 2015-06-21 DIAGNOSIS — K625 Hemorrhage of anus and rectum: Secondary | ICD-10-CM | POA: Insufficient documentation

## 2015-06-21 DIAGNOSIS — Z79899 Other long term (current) drug therapy: Secondary | ICD-10-CM | POA: Insufficient documentation

## 2015-06-21 LAB — CBC
HCT: 42 % (ref 36.0–46.0)
Hemoglobin: 13.7 g/dL (ref 12.0–15.0)
MCH: 29 pg (ref 26.0–34.0)
MCHC: 32.6 g/dL (ref 30.0–36.0)
MCV: 89 fL (ref 78.0–100.0)
PLATELETS: 265 10*3/uL (ref 150–400)
RBC: 4.72 MIL/uL (ref 3.87–5.11)
RDW: 14.2 % (ref 11.5–15.5)
WBC: 8.3 10*3/uL (ref 4.0–10.5)

## 2015-06-21 LAB — URINALYSIS, ROUTINE W REFLEX MICROSCOPIC
Bilirubin Urine: NEGATIVE
GLUCOSE, UA: NEGATIVE mg/dL
Ketones, ur: NEGATIVE mg/dL
Nitrite: NEGATIVE
PH: 6 (ref 5.0–8.0)
Protein, ur: 100 mg/dL — AB
Specific Gravity, Urine: 1.02 (ref 1.005–1.030)
Urobilinogen, UA: 0.2 mg/dL (ref 0.0–1.0)

## 2015-06-21 LAB — COMPREHENSIVE METABOLIC PANEL
ALBUMIN: 3.8 g/dL (ref 3.5–5.0)
ALK PHOS: 60 U/L (ref 38–126)
ALT: 14 U/L (ref 14–54)
AST: 19 U/L (ref 15–41)
Anion gap: 8 (ref 5–15)
BILIRUBIN TOTAL: 0.2 mg/dL — AB (ref 0.3–1.2)
BUN: 8 mg/dL (ref 6–20)
CO2: 27 mmol/L (ref 22–32)
Calcium: 9.5 mg/dL (ref 8.9–10.3)
Chloride: 107 mmol/L (ref 101–111)
Creatinine, Ser: 0.98 mg/dL (ref 0.44–1.00)
GFR calc non Af Amer: >60 mL/min (ref >60)
GLUCOSE: 95 mg/dL (ref 65–99)
POTASSIUM: 3.9 mmol/L (ref 3.5–5.1)
Sodium: 142 mmol/L (ref 135–145)
TOTAL PROTEIN: 7.4 g/dL (ref 6.5–8.1)

## 2015-06-21 LAB — URINE MICROSCOPIC-ADD ON

## 2015-06-21 LAB — LIPASE, BLOOD: Lipase: 19 U/L — ABNORMAL LOW (ref 22–51)

## 2015-06-21 LAB — POC OCCULT BLOOD, ED: Fecal Occult Bld: POSITIVE — AB

## 2015-06-21 IMAGING — CT CT ABD-PELV W/ CM
2 of 5 series · 16 of 46 positions shown, 18 images · IV contrast (Omni 300)
Comparison: None

CLINICAL DATA: Mid abdominal and periumbilical pain, sharp rectal
pain, blood after wiping following a bowel movement, history asthma,
smoking

EXAM:
CT ABDOMEN AND PELVIS WITH CONTRAST
TECHNIQUE: Multidetector CT imaging of the abdomen and pelvis was performed
using the standard protocol following bolus administration of
intravenous contrast. Sagittal and coronal MPR images reconstructed
from axial data set.
CONTRAST:  100 cc Omnipaque 300 IV.  Dilute oral contrast.

[Series 2: abd/ pelvis 5.0 i30f 1 · axial · 0.89mm/px · z∈[-1015,-570]mm · 13 of 99 slices shown, 15 images]
[im 5/99  soft-tissue]
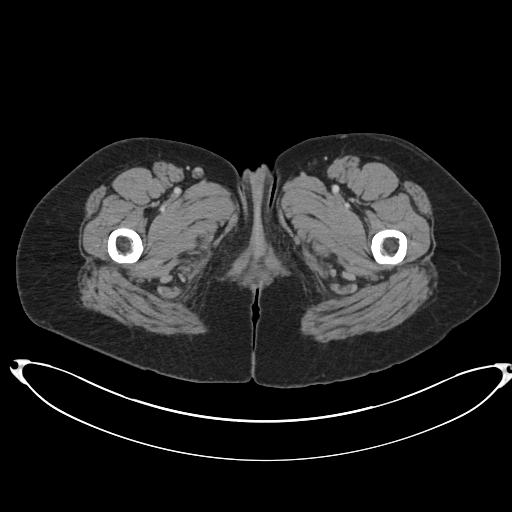
[im 5/99  bone]
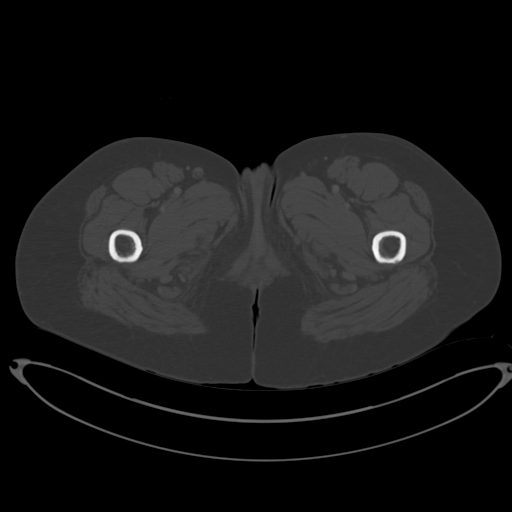
[im 15/99  soft-tissue]
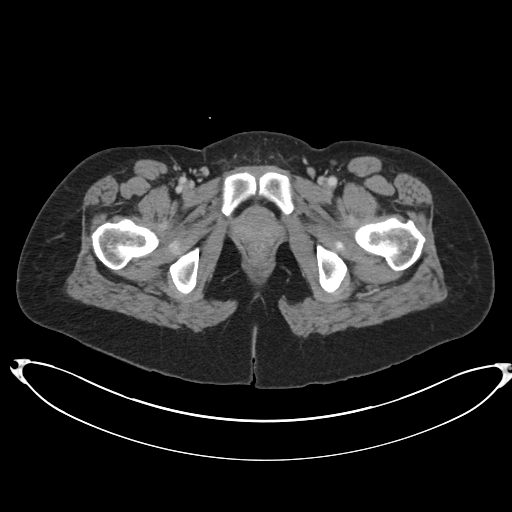
[im 20/99  soft-tissue]
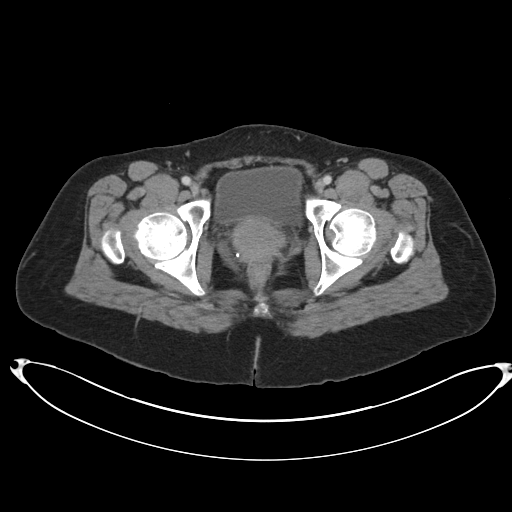
[im 30/99  soft-tissue]
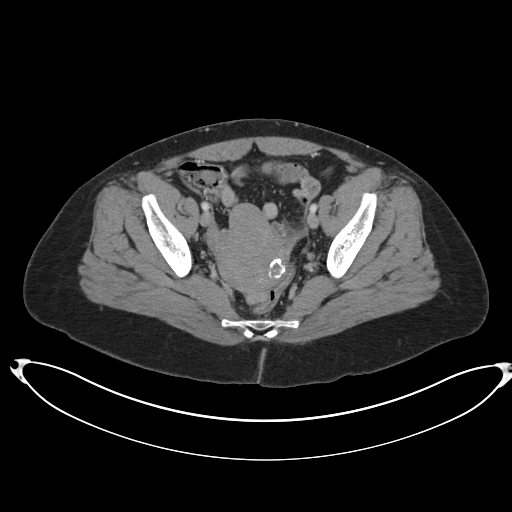
[im 35/99  soft-tissue]
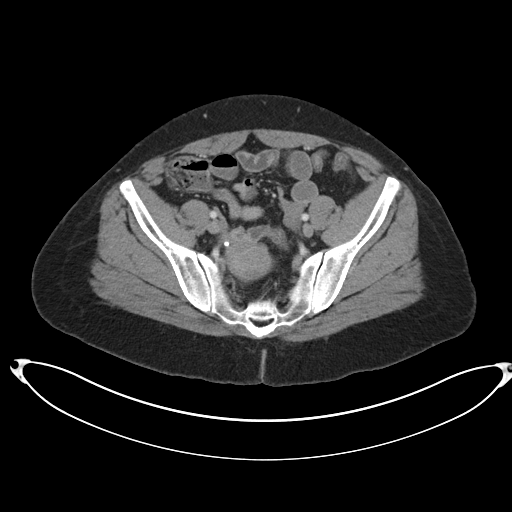
[im 45/99  soft-tissue]
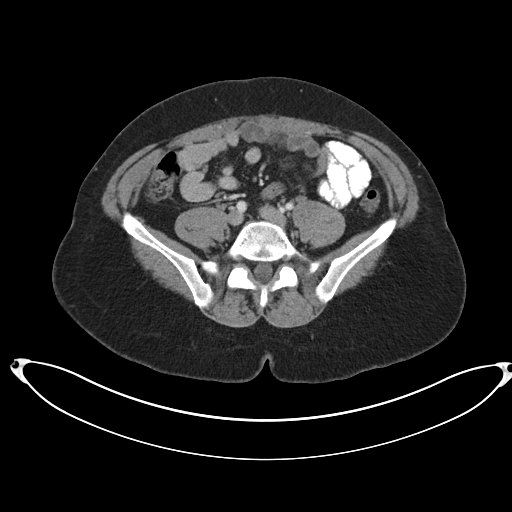
[im 50/99  soft-tissue]
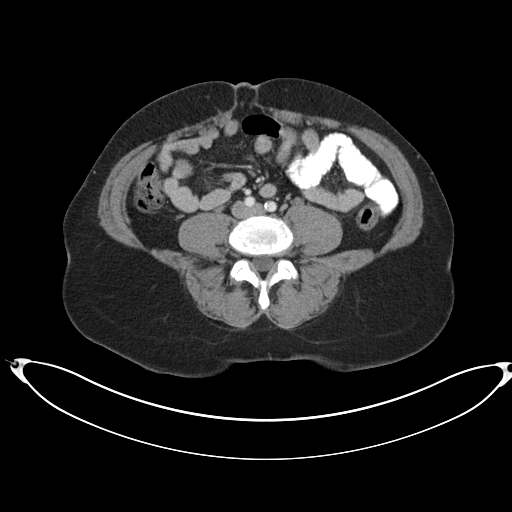
[im 54/99  soft-tissue]
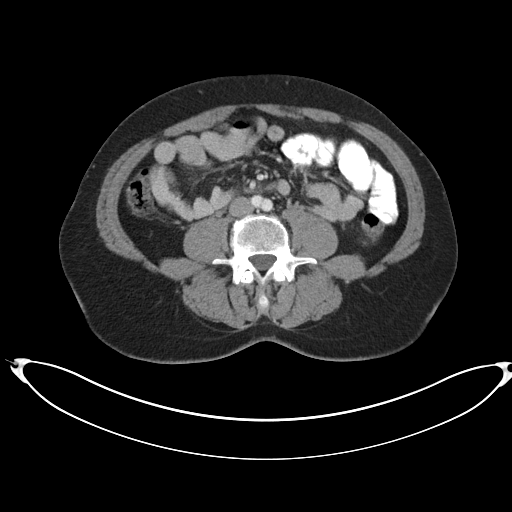
[im 64/99  soft-tissue]
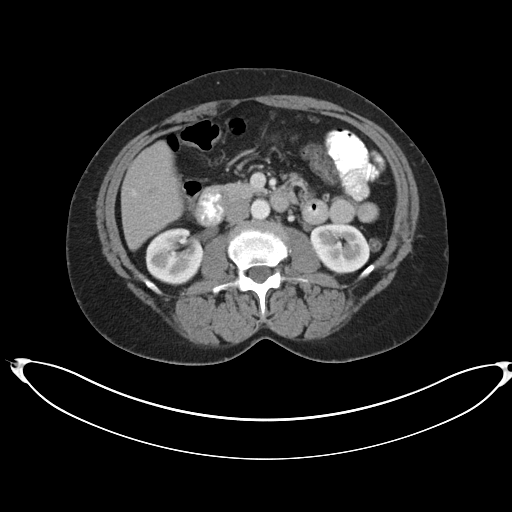
[im 64/99  bone]
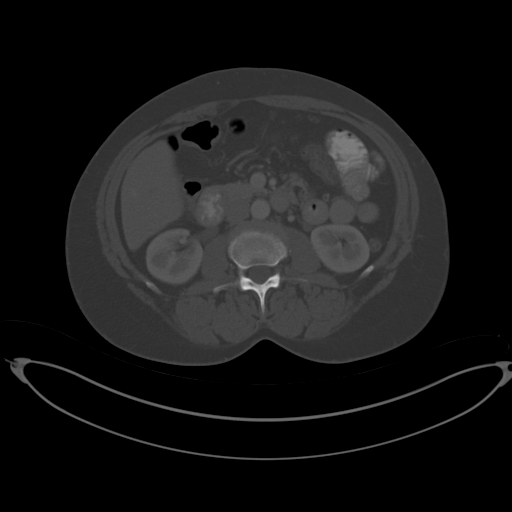
[im 69/99  soft-tissue]
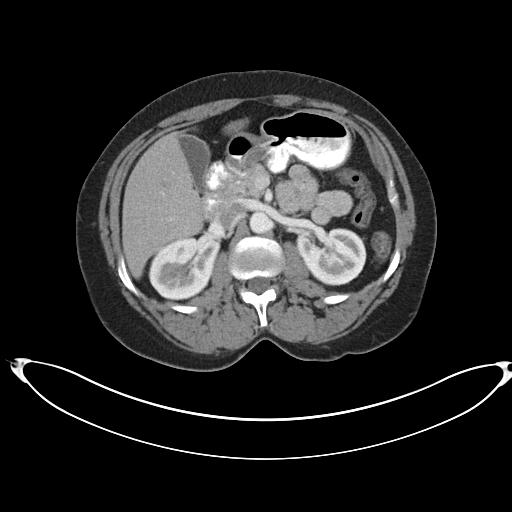
[im 79/99  soft-tissue]
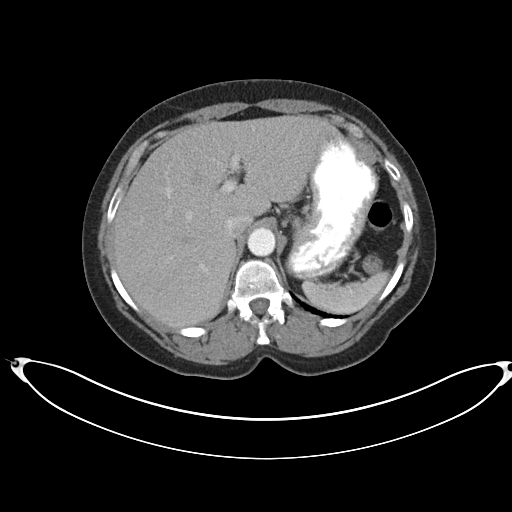
[im 84/99  soft-tissue]
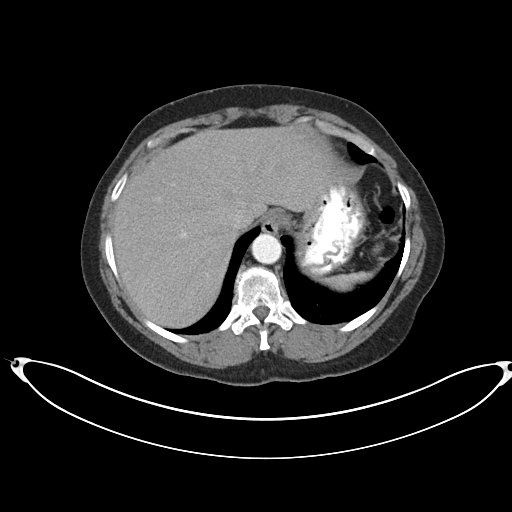
[im 94/99  soft-tissue]
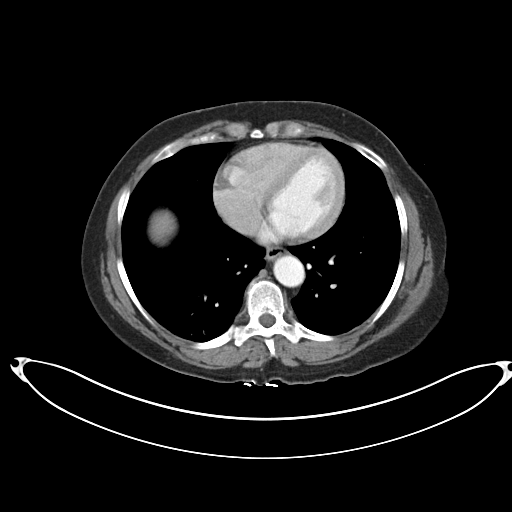

[Series 5: coronals · coronal · 0.79mm/px · 3 of 146 slices shown]
[im 49/146  soft-tissue]
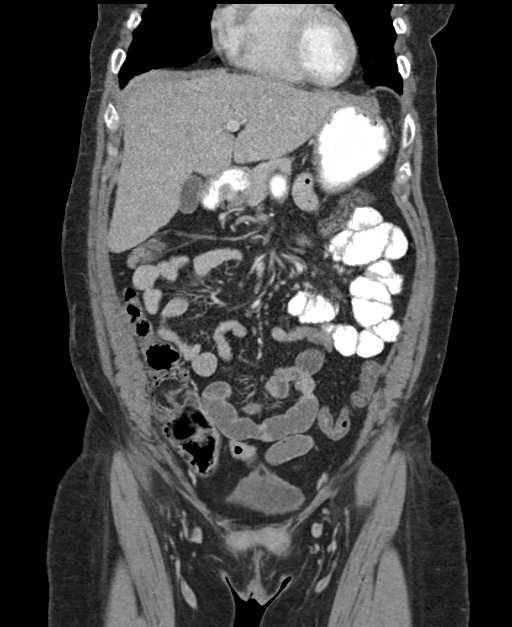
[im 65/146  soft-tissue]
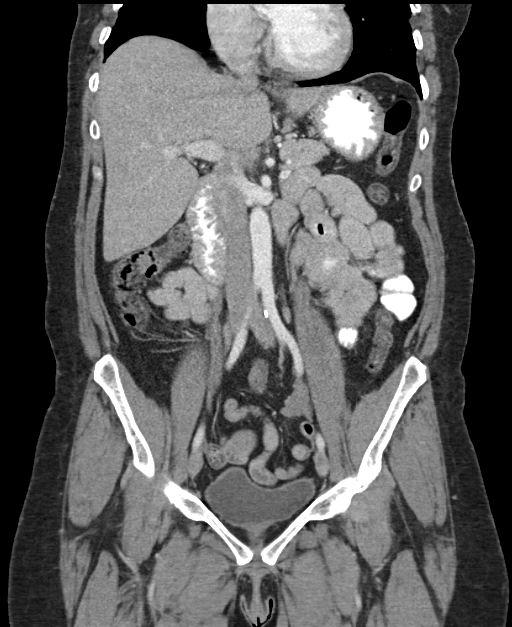
[im 81/146  soft-tissue]
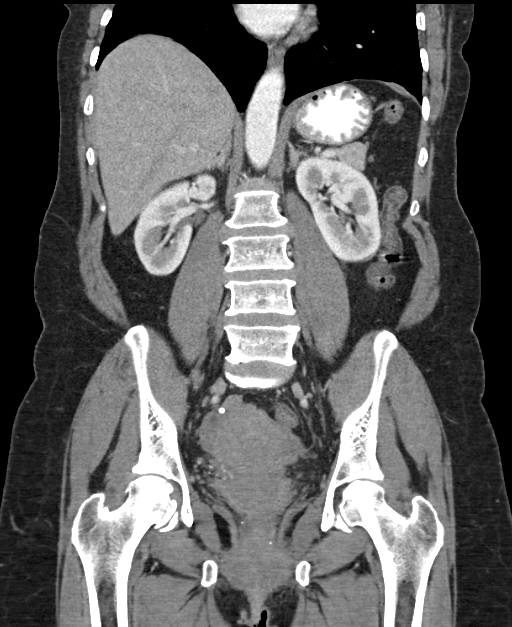

[16 of 46 positions shown; findings below may reference images not displayed]

FINDINGS: Dependent atelectasis in both lower lobes.

Liver, gallbladder, spleen, pancreas, kidneys, and adrenal glands
normal appearance.

Normal appendix.

Small calcified leiomyoma within LEFT lateral aspect of uterus with
additional probable non calcified leiomyomata within uterus.

Unremarkable adnexa, bladder and ureters.

Small umbilical hernia containing fat.

Stomach and bowel loops normal appearance.

No mass, adenopathy, free fluid, free air, or inflammatory process.

Osseous structures unremarkable.
IMPRESSION: Probable uterine leiomyomata accounting for enlarged and nodular
appearance of the uterus.

Small umbilical hernia containing fat.

No other definite intra-abdominal or intrapelvic abnormalities.

## 2015-06-21 MED ORDER — IOHEXOL 300 MG/ML  SOLN
100.0000 mL | Freq: Once | INTRAMUSCULAR | Status: AC | PRN
  Administered 2015-06-21: 100 mL via INTRAVENOUS

## 2015-06-21 MED ORDER — IOHEXOL 300 MG/ML  SOLN: 25.0000 mL | Freq: Once | INTRAMUSCULAR | Status: DC | PRN

## 2015-06-21 MED ORDER — MORPHINE SULFATE 4 MG/ML IJ SOLN
4.0000 mg | Freq: Once | INTRAMUSCULAR | Status: AC
  Administered 2015-06-21: 4 mg via INTRAVENOUS
  Filled 2015-06-21: qty 1

## 2015-06-21 MED ORDER — SODIUM CHLORIDE 0.9 % IV BOLUS (SEPSIS)
1000.0000 mL | Freq: Once | INTRAVENOUS | Status: AC
  Administered 2015-06-21: 1000 mL via INTRAVENOUS

## 2015-06-21 MED ORDER — ONDANSETRON HCL 4 MG/2ML IJ SOLN
4.0000 mg | Freq: Once | INTRAMUSCULAR | Status: AC
  Administered 2015-06-21: 4 mg via INTRAVENOUS
  Filled 2015-06-21: qty 2

## 2015-06-21 NOTE — Discharge Instructions (Signed)
Please call and follow up with GI specialist for further evaluation of your rectal bleeding.  You also have evidence of uterine fibroid on CT scan.  Return to ER if your bleeding worsen, or if you have other concerns.  Bloody Stools Bloody stools often mean that there is a problem in the digestive tract. Your caregiver may use the term "melena" to describe black, tarry, and bad smelling stools or "hematochezia" to describe red or maroon-colored stools. Blood seen in the stool can be caused by bleeding anywhere along the intestinal tract.  A black stool usually means that blood is coming from the upper part of the gastrointestinal tract (esophagus, stomach, or small bowel). Passing maroon-colored stools or bright red blood usually means that blood is coming from lower down in the large bowel or the rectum. However, sometimes massive bleeding in the stomach or small intestine can cause bright red bloody stools.  Consuming black licorice, lead, iron pills, medicines containing bismuth subsalicylate, or blueberries can also cause black stools. Your caregiver can test black stools to see if blood is present. It is important that the cause of the bleeding be found. Treatment can then be started, and the problem can be corrected. Rectal bleeding may not be serious, but you should not assume everything is okay until you know the cause.It is very important to follow up with your caregiver or a specialist in gastrointestinal problems. CAUSES  Blood in the stools can come from various underlying causes.Often, the cause is not found during your first visit. Testing is often needed to discover the cause of bleeding in the gastrointestinal tract. Causes range from simple to serious or even life-threatening.Possible causes include:  Hemorrhoids.These are veins that are full of blood (engorged) in the rectum. They cause pain, inflammation, and may bleed.  Anal fissures.These are areas of painful tearing which may  bleed. They are often caused by passing hard stool.  Diverticulosis.These are pouches that form on the colon over time, with age, and may bleed significantly.  Diverticulitis.This is inflammation in areas with diverticulosis. It can cause pain, fever, and bloody stools, although bleeding is rare.  Proctitis and colitis. These are inflamed areas of the rectum or colon. They may cause pain, fever, and bloody stools.  Polyps and cancer. Colon cancer is a leading cause of preventable cancer death.It often starts out as precancerous polyps that can be removed during a colonoscopy, preventing progression into cancer. Sometimes, polyps and cancer may cause rectal bleeding.  Gastritis and ulcers.Bleeding from the upper gastrointestinal tract (near the stomach) may travel through the intestines and produce black, sometimes tarry, often bad smelling stools. In certain cases, if the bleeding is fast enough, the stools may not be black, but red and the condition may be life-threatening. SYMPTOMS  You may have stools that are bright red and bloody, that are normal color with blood on them, or that are dark black and tarry. In some cases, you may only have blood in the toilet bowl. Any of these cases need medical care. You may also have:  Pain at the anus or anywhere in the rectum.  Lightheadedness or feeling faint.  Extreme weakness.  Nausea or vomiting.  Fever. DIAGNOSIS Your caregiver may use the following methods to find the cause of your bleeding:  Taking a medical history. Age is important. Older people tend to develop polyps and cancer more often. If there is anal pain and a hard, large stool associated with bleeding, a tear of the anus may  be the cause. If blood drips into the toilet after a bowel movement, bleeding hemorrhoids may be the problem. The color and frequency of the bleeding are additional considerations. In most cases, the medical history provides clues, but seldom the final  answer.  A visual and finger (digital) exam. Your caregiver will inspect the anal area, looking for tears and hemorrhoids. A finger exam can provide information when there is tenderness or a growth inside. In men, the prostate is also examined.  Endoscopy. Several types of small, long scopes (endoscopes) are used to view the colon.  In the office, your caregiver may use a rigid, or more commonly, a flexible viewing sigmoidoscope. This exam is called flexible sigmoidoscopy. It is performed in 5 to 10 minutes.  A more thorough exam is accomplished with a colonoscope. It allows your caregiver to view the entire 5 to 6 foot long colon. Medicine to help you relax (sedative) is usually given for this exam. Frequently, a bleeding lesion may be present beyond the reach of the sigmoidoscope. So, a colonoscopy may be the best exam to start with. Both exams are usually done on an outpatient basis. This means the patient does not stay overnight in the hospital or surgery center.  An upper endoscopy may be needed to examine your stomach. Sedation is used and a flexible endoscope is put in your mouth, down to your stomach.  A barium enema X-ray. This is an X-ray exam. It uses liquid barium inserted by enema into the rectum. This test alone may not identify an actual bleeding point. X-rays highlight abnormal shadows, such as those made by lumps (tumors), diverticuli, or colitis. TREATMENT  Treatment depends on the cause of your bleeding.   For bleeding from the stomach or colon, the caregiver doing your endoscopy or colonoscopy may be able to stop the bleeding as part of the procedure.  Inflammation or infection of the colon can be treated with medicines.  Many rectal problems can be treated with creams, suppositories, or warm baths.  Surgery is sometimes needed.  Blood transfusions are sometimes needed if you have lost a lot of blood.  For any bleeding problem, let your caregiver know if you take aspirin  or other blood thinners regularly. HOME CARE INSTRUCTIONS   Take any medicines exactly as prescribed.  Keep your stools soft by eating a diet high in fiber. Prunes (1 to 3 a day) work well for many people.  Drink enough water and fluids to keep your urine clear or pale yellow.  Take sitz baths if advised. A sitz bath is when you sit in a bathtub with warm water for 10 to 15 minutes to soak, soothe, and cleanse the rectal area.  If enemas or suppositories are advised, be sure you know how to use them. Tell your caregiver if you have problems with this.  Monitor your bowel movements to look for signs of improvement or worsening. SEEK MEDICAL CARE IF:   You do not improve in the time expected.  Your condition worsens after initial improvement.  You develop any new symptoms. SEEK IMMEDIATE MEDICAL CARE IF:   You develop severe or prolonged rectal bleeding.  You vomit blood.  You feel weak or faint.  You have a fever. MAKE SURE YOU:  Understand these instructions.  Will watch your condition.  Will get help right away if you are not doing well or get worse. Document Released: 10/18/2002 Document Revised: 01/20/2012 Document Reviewed: 03/15/2011 [facility] Patient Information 2015 [place], [place]. This  information is not intended to replace advice given to you by your health care provider. Make sure you discuss any questions you have with your health care provider.

## 2015-06-21 NOTE — ED Notes (Signed)
Pt here for mid abd pain and sharp pains in rectum. sts there is blood after she wipes after BM. sts last BM yesterday and normal.

## 2015-06-21 NOTE — ED Notes (Signed)
Pt had large BM of bright red blood. PA Gertie Fey made aware.

## 2015-06-21 NOTE — ED Notes (Signed)
Pt transported to CT ?

## 2015-06-21 NOTE — ED Provider Notes (Signed)
CSN: [id number]     Arrival date & time 06/21/15  1400 History   First MD Initiated Contact with Patient 06/21/15 1557     Chief Complaint  Patient presents with  . Abdominal Pain     (Consider location/radiation/quality/duration/timing/severity/associated sxs/prior Treatment) HPI   49 year old female with history of bipolar, PTSD and asthma presenting for evaluation of abdominal pain. Patient developed gradual onset of periumbilical abdominal pain earlier today which has been persistent. She described pain as sharp and achy sensation with accompanying rectal bleeding. States she has had several episodes of bright red blood per rectum without any rectal pain or itchiness. Abdominal pain is currently 10 out of 10, persistent, worsening with palpation. She reports subjective chills and decreased appetite. She endorse nausea without vomiting. She denies fever, headache, chest pain, shortness of breath, productive cough, back pain, dysuria, hematuria, vaginal bleeding, vaginal discharge. Patient felt fine yesterday and had normal bowel movement. She had remote history of colonoscopy with benign polyps which has been removed.    Past Medical History  Diagnosis Date  . Asthma   . Post traumatic stress disorder   . Bipolar 1 disorder    History reviewed. No pertinent past surgical history. History reviewed. No pertinent family history. Social History  Substance Use Topics  . Smoking status: Current Every Day Smoker  . Smokeless tobacco: None  . Alcohol Use: Yes   OB History    No data available     Review of Systems  All other systems reviewed and are negative.     Allergies  Aspirin and Other  Home Medications   Prior to Admission medications   Medication Sig Start Date End Date Taking? Authorizing Provider  albuterol (PROVENTIL HFA;VENTOLIN HFA) 108 (90 BASE) MCG/ACT inhaler Inhale 2 puffs into the lungs every 6 (six) hours as needed for wheezing or shortness of breath.     Historical Provider, MD  cephALEXin (KEFLEX) 500 MG capsule Take 1 capsule (500 mg total) by mouth 4 (four) times daily. 02/27/15   [name], PA-C  diphenhydrAMINE (BENADRYL) 25 MG tablet Take 1 tablet (25 mg total) by mouth every 8 (eight) hours as needed for itching. 02/27/15   [name], PA-C  hydrOXYzine (ATARAX/VISTARIL) 50 MG tablet Take 50 mg by mouth 3 (three) times daily as needed.    Historical Provider, MD  ibuprofen (ADVIL,MOTRIN) 800 MG tablet Take 1 tablet (800 mg total) by mouth 3 (three) times daily. 08/14/14   Delos Haring, PA-C  lithium carbonate 300 MG capsule Take 900 mg by mouth at bedtime.    Historical Provider, MD  mometasone (NASONEX) 50 MCG/ACT nasal spray Place 1 spray into both nostrils daily.    Historical Provider, MD  oxyCODONE-acetaminophen (PERCOCET/ROXICET) 5-325 MG per tablet Take 1-2 tablets by mouth every 6 (six) hours as needed. 08/14/14   Tiffany Carlota Raspberry, PA-C  traZODone (DESYREL) 100 MG tablet Take 200 mg by mouth at bedtime.    Historical Provider, MD   BP 116/68 mmHg  Pulse 70  Temp(Src) 97.6 F (36.4 C) (Oral)  Resp 18  SpO2 100% Physical Exam  Constitutional: She appears well-developed and well-nourished. No distress.  HENT:  Head: Atraumatic.  Eyes: Conjunctivae are normal.  Neck: Neck supple.  Cardiovascular: Normal rate and regular rhythm.   Pulmonary/Chest: Effort normal and breath sounds normal.  Abdominal: Soft. Bowel sounds are normal. She exhibits no distension. There is tenderness (Periumbilical tenderness and left lower quadrant tenderness on palpation without guarding or rebound tenderness.).  Genitourinary:  Chaperone present during exam. Bright red blood per rectum, Hemoccult-positive, normal rectal tone, no mass.  Neurological: She is alert.  Skin: No rash noted.  Psychiatric: She has a normal mood and affect.  Nursing note and vitals reviewed.   ED Course  Procedures (including critical care time)  Patient  presents with abdominal pain and associated bright red blood per rectum. Abdomen nontender on exam. CT scan ordered to rule out diverticulitis, colitis, or other acute emergent abdominal etiology.  5:10 PM UA shows moderate hemoglobin. This is likely a contaminant from her rectal bleeding. Patient has no urinary complaint concerning for UTI.  5:47 PM Hemoglobin is 13.7. Labs are otherwise reassuring. No evidence of anemia with rectal bleeding. Abdominal and pelvis CT scan showing a probable uterine leiomyomata but no other acute finding noted. Patient acknowledge that she has history of uterine fibroids, this is not new. She has not had any further rectal bleeding while in the ER. She is resting comfortably. Patient will benefit following up with GI specialist for outpatient evaluation of her rectal bleeding. Return precautions discussed.  Labs Review Labs Reviewed  LIPASE, BLOOD - Abnormal; Notable for the following:    Lipase 19 (*)    All other components within normal limits  COMPREHENSIVE METABOLIC PANEL - Abnormal; Notable for the following:    Total Bilirubin 0.2 (*)    All other components within normal limits  URINALYSIS, ROUTINE W REFLEX MICROSCOPIC (NOT AT [facility]) - Abnormal; Notable for the following:    APPearance CLOUDY (*)    Hgb urine dipstick MODERATE (*)    Protein, ur 100 (*)    Leukocytes, UA MODERATE (*)    All other components within normal limits  URINE MICROSCOPIC-ADD ON - Abnormal; Notable for the following:    Squamous Epithelial / LPF MANY (*)    Bacteria, UA MANY (*)    All other components within normal limits  CBC  POC OCCULT BLOOD, ED    Imaging Review Ct Abdomen Pelvis W Contrast  06/21/2015   CLINICAL DATA:  Mid abdominal and periumbilical pain, sharp rectal pain, blood after wiping following a bowel movement, history asthma, smoking  EXAM: CT ABDOMEN AND PELVIS WITH CONTRAST  TECHNIQUE: Multidetector CT imaging of the abdomen and pelvis was performed  using the standard protocol following bolus administration of intravenous contrast. Sagittal and coronal MPR images reconstructed from axial data set.  CONTRAST:  100 cc Omnipaque 300 IV.  Dilute oral contrast.  COMPARISON:  None  FINDINGS: Dependent atelectasis in both lower lobes.  Liver, gallbladder, spleen, pancreas, kidneys, and adrenal glands normal appearance.  Normal appendix.  Small calcified leiomyoma within LEFT lateral aspect of uterus with additional probable non calcified leiomyomata within uterus.  Unremarkable adnexa, bladder and ureters.  Small umbilical hernia containing fat.  Stomach and bowel loops normal appearance.  No mass, adenopathy, free fluid, free air, or inflammatory process.  Osseous structures unremarkable.  IMPRESSION: Probable uterine leiomyomata accounting for enlarged and nodular appearance of the uterus.  Small umbilical hernia containing fat.  No other definite intra-abdominal or intrapelvic abnormalities.   Electronically Signed   By: Lavonia Dana M.D.   On: 06/21/2015 17:21     EKG Interpretation None      MDM   Final diagnoses:  BRBPR (bright red blood per rectum)   BP 138/93 mmHg  Pulse 70  Temp(Src) 97.6 F (36.4 C) (Oral)  Resp 16  SpO2 100%  I have reviewed nursing notes and vital signs. I  personally viewed the imaging tests through PACS system and agrees with radiologist's intepretation I reviewed available ER/hospitalization records through the EMR     Domenic Moras, PA-C 06/21/15 [place], MD 06/26/15 [phone]

## 2015-10-20 ENCOUNTER — Emergency Department (HOSPITAL_COMMUNITY): Payer: Self-pay

## 2015-10-20 ENCOUNTER — Encounter (HOSPITAL_COMMUNITY): Payer: Self-pay | Admitting: Emergency Medicine

## 2015-10-20 ENCOUNTER — Emergency Department (HOSPITAL_COMMUNITY)
Admission: EM | Admit: 2015-10-20 | Discharge: 2015-10-21 | Disposition: A | Discharge status: Home / Self Care | Payer: Self-pay | Attending: Emergency Medicine | Admitting: Emergency Medicine

## 2015-10-20 DIAGNOSIS — I10 Essential (primary) hypertension: Secondary | ICD-10-CM | POA: Insufficient documentation

## 2015-10-20 DIAGNOSIS — J45909 Unspecified asthma, uncomplicated: Secondary | ICD-10-CM | POA: Insufficient documentation

## 2015-10-20 DIAGNOSIS — R51 Headache: Secondary | ICD-10-CM | POA: Insufficient documentation

## 2015-10-20 DIAGNOSIS — Z79899 Other long term (current) drug therapy: Secondary | ICD-10-CM | POA: Insufficient documentation

## 2015-10-20 DIAGNOSIS — F17200 Nicotine dependence, unspecified, uncomplicated: Secondary | ICD-10-CM | POA: Insufficient documentation

## 2015-10-20 DIAGNOSIS — F319 Bipolar disorder, unspecified: Secondary | ICD-10-CM | POA: Insufficient documentation

## 2015-10-20 LAB — URINALYSIS, ROUTINE W REFLEX MICROSCOPIC
Bilirubin Urine: NEGATIVE
GLUCOSE, UA: NEGATIVE mg/dL
Ketones, ur: NEGATIVE mg/dL
Leukocytes, UA: NEGATIVE
Nitrite: NEGATIVE
Protein, ur: 30 mg/dL — AB
SPECIFIC GRAVITY, URINE: 1.019 (ref 1.005–1.030)
pH: 6 (ref 5.0–8.0)

## 2015-10-20 LAB — BASIC METABOLIC PANEL
Anion gap: 8 (ref 5–15)
BUN: 11 mg/dL (ref 6–20)
CHLORIDE: 112 mmol/L — AB (ref 101–111)
CO2: 22 mmol/L (ref 22–32)
CREATININE: 0.91 mg/dL (ref 0.44–1.00)
Calcium: 9.5 mg/dL (ref 8.9–10.3)
GFR calc Af Amer: >60 mL/min (ref >60)
GFR calc non Af Amer: >60 mL/min (ref >60)
Glucose, Bld: 107 mg/dL — ABNORMAL HIGH (ref 65–99)
Potassium: 3.4 mmol/L — ABNORMAL LOW (ref 3.5–5.1)
Sodium: 142 mmol/L (ref 135–145)

## 2015-10-20 LAB — URINE MICROSCOPIC-ADD ON: WBC, UA: NONE SEEN WBC/hpf (ref 0–5)

## 2015-10-20 LAB — SEDIMENTATION RATE: Sed Rate: 6 mm/hr (ref 0–22)

## 2015-10-20 IMAGING — CT CT HEAD W/O CM
2 series · 15 of 30 positions shown, 17 images · non-contrast
Comparison: None.

CLINICAL DATA: Shortness of Breath, hypertension, right parietal
pain

EXAM:
CT HEAD WITHOUT CONTRAST
TECHNIQUE: Contiguous axial images were obtained from the base of the skull
through the vertex without intravenous contrast.

[Series 2: head without · axial · non-contrast · 0.41mm/px · z∈[-122,-12]mm · 7 of 30 slices shown, 9 images]
[im 4/30  brain]
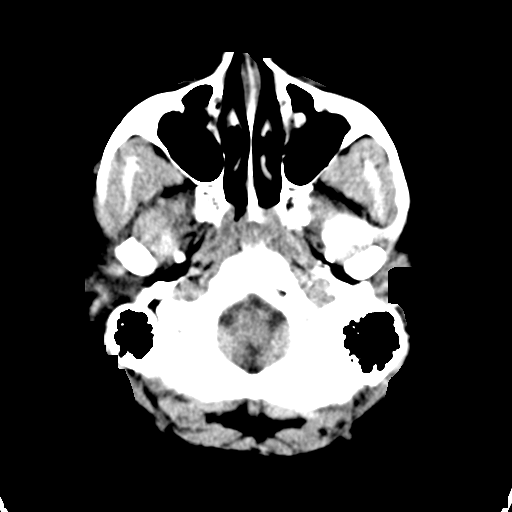
[im 4/30  bone]
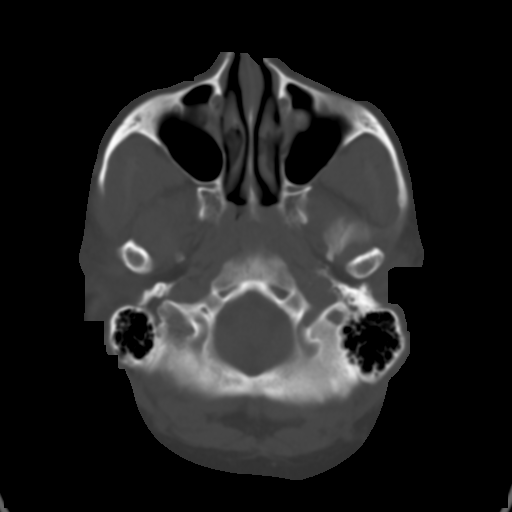
[im 8/30  brain]
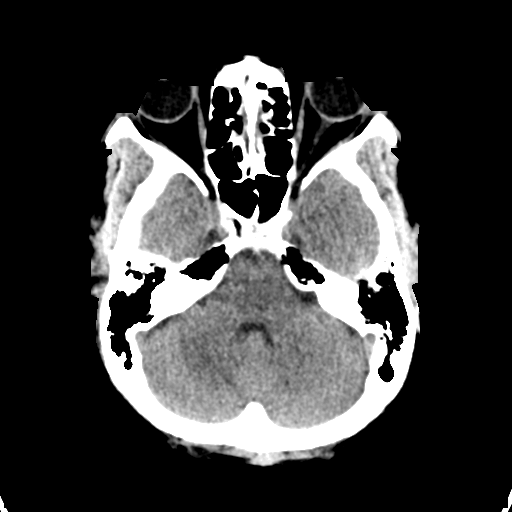
[im 11/30  brain]
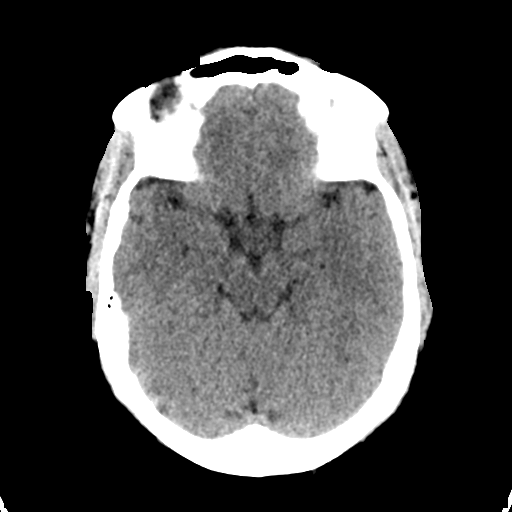
[im 15/30  brain]
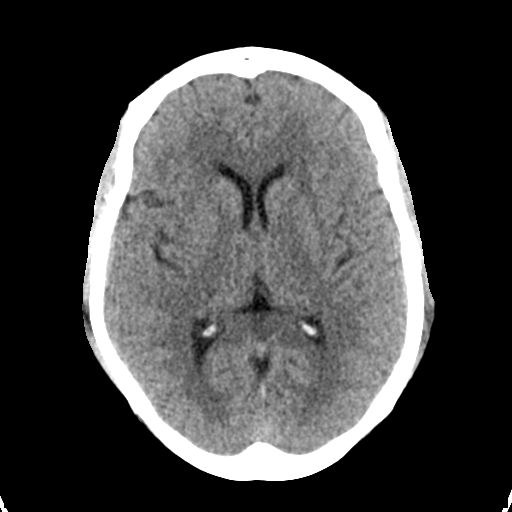
[im 19/30  brain]
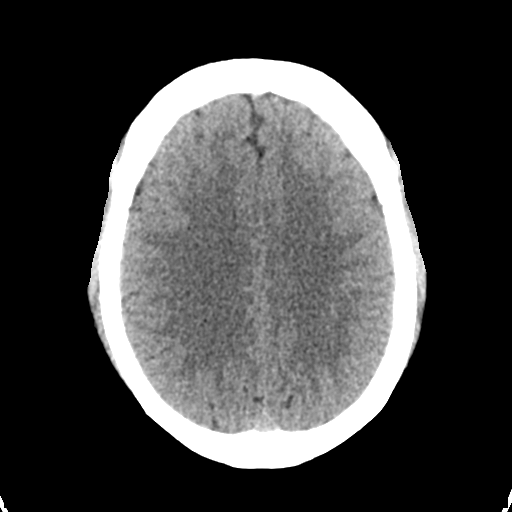
[im 19/30  bone]
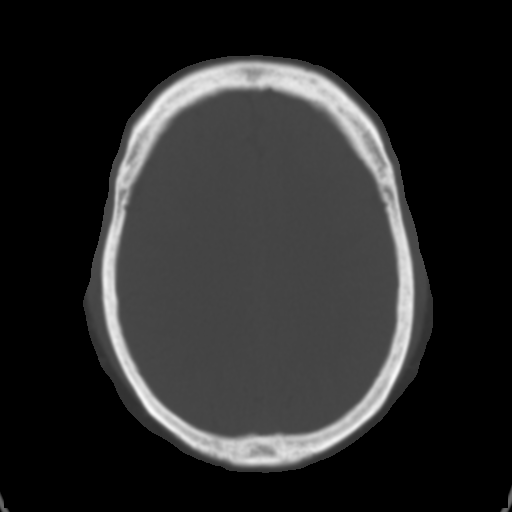
[im 22/30  brain]
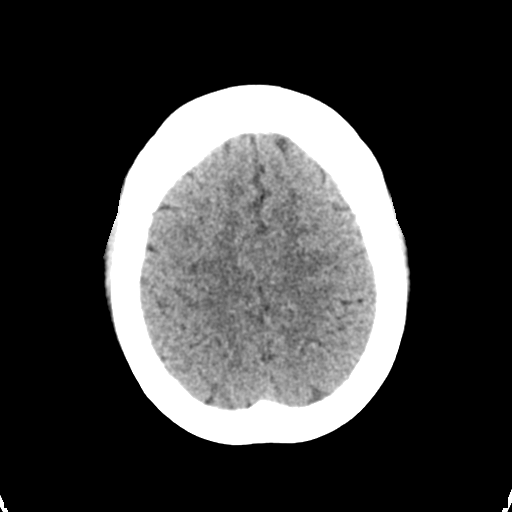
[im 26/30  brain]
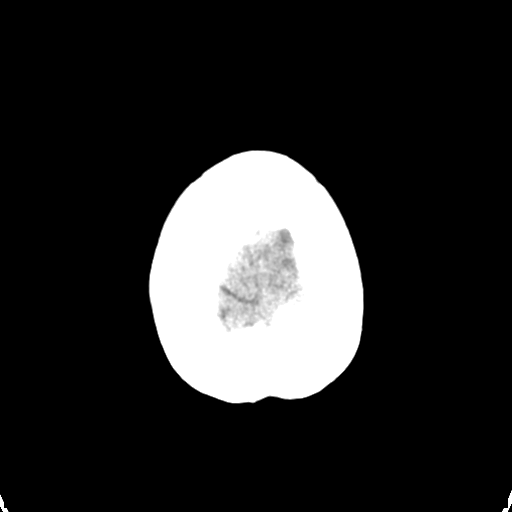

[Series 3: head bone · axial · 0.41mm/px · z∈[-124,-8]mm · 8 of 74 slices shown]
[im 8/74  bone]
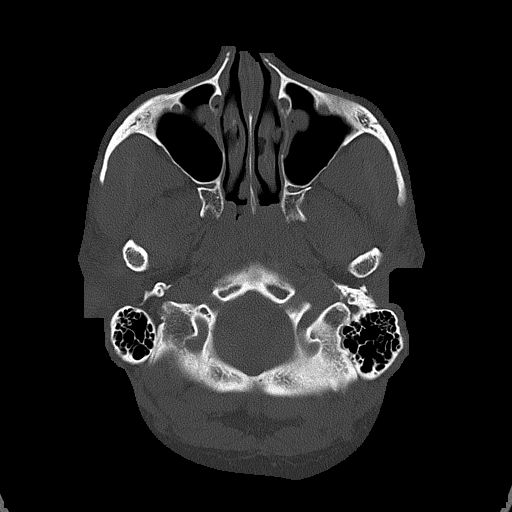
[im 15/74  bone]
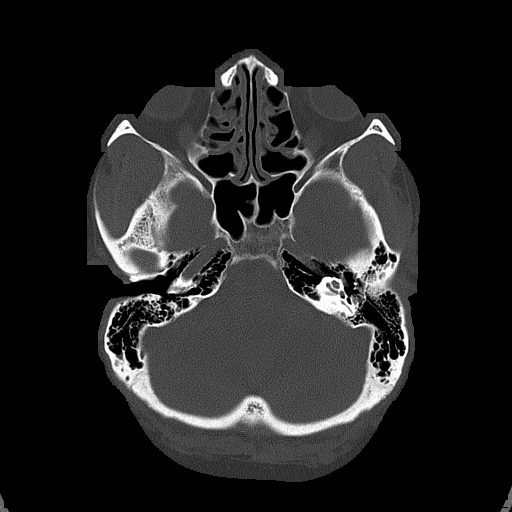
[im 22/74  bone]
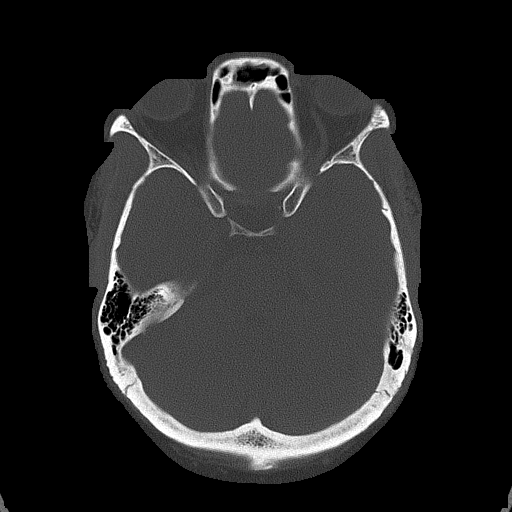
[im 33/74  bone]
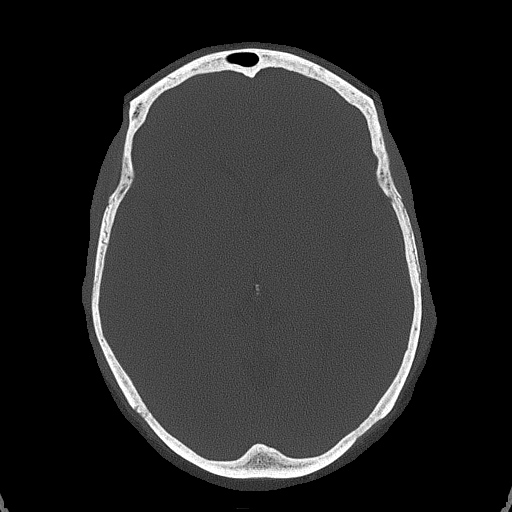
[im 41/74  bone]
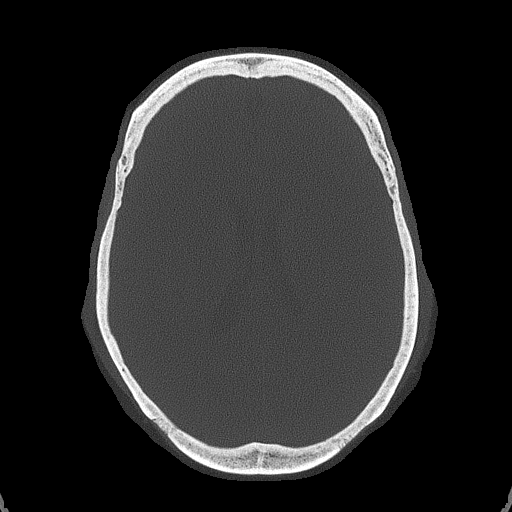
[im 52/74  bone]
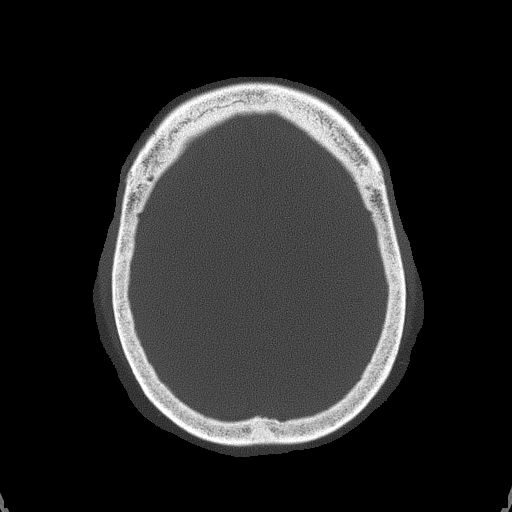
[im 59/74  bone]
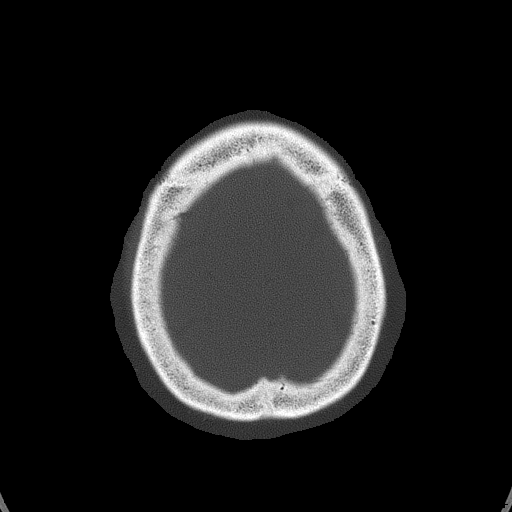
[im 66/74  bone]
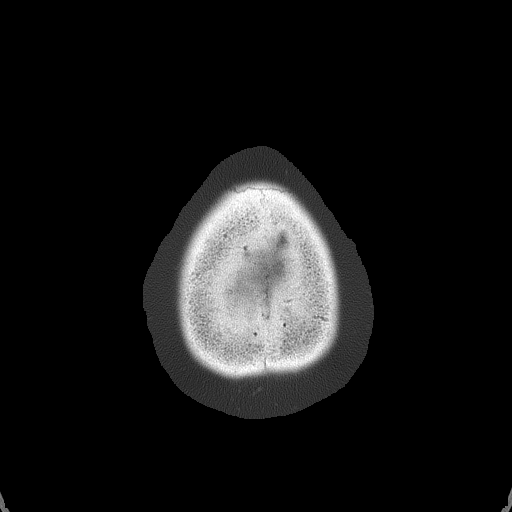

[15 of 30 positions shown; findings below may reference images not displayed]

FINDINGS: No skull fracture is noted. Paranasal sinuses shows probable mucous
retention cyst right maxillary sinus measures 2 cm. The mastoid air
cells are unremarkable.

No intracranial hemorrhage, mass effect or midline shift. No acute
cortical infarction. No mass lesion is noted on this unenhanced
scan. The gray and white-matter differentiation is preserved.
IMPRESSION: No acute intracranial abnormality. Probable mucous retention cyst
right maxillary sinus measures 2 cm.

## 2015-10-20 MED ORDER — DIPHENHYDRAMINE HCL 50 MG/ML IJ SOLN
25.0000 mg | Freq: Once | INTRAMUSCULAR | Status: AC
  Administered 2015-10-20: 25 mg via INTRAVENOUS
  Filled 2015-10-20: qty 1

## 2015-10-20 MED ORDER — KETOROLAC TROMETHAMINE 30 MG/ML IJ SOLN
30.0000 mg | Freq: Once | INTRAMUSCULAR | Status: AC
  Administered 2015-10-20: 30 mg via INTRAVENOUS
  Filled 2015-10-20: qty 1

## 2015-10-20 MED ORDER — PROCHLORPERAZINE EDISYLATE 5 MG/ML IJ SOLN
10.0000 mg | Freq: Once | INTRAMUSCULAR | Status: AC
  Administered 2015-10-20: 10 mg via INTRAVENOUS
  Filled 2015-10-20: qty 2

## 2015-10-20 NOTE — ED Provider Notes (Signed)
CSN: [id number]     Arrival date & time 10/20/15  1657 History   First MD Initiated Contact with Patient 10/20/15 1840     Chief Complaint  Patient presents with  . Hypertension  . Headache     (Consider location/radiation/quality/duration/timing/severity/associated sxs/prior Treatment) HPI\ Patient is a 49 year old female with past medical history significant for bipolar disorder, asthma, who presents to the emergency department with hypertension. Patient reports that she has not seen a PCP in the area for over 3 years. She saw PCP yesterday to establish care, found to be hypertensive, was not started on medication since she had no prior history of hypertension.  Today the patient checked her blood pressure at [facility], BP 190/90s. She was told to come in for evaluation of her hypertension. Reports a 2 day history of right-sided headache, sharp, nonradiating, constant, progressively worsened and not sudden in onset. Mild improvement with Excedrin Migraine. Nothing worsens the pain. No association with nausea, vomiting, photophobia, phonophobia, jaw claudication. Denies recent head trauma.  No prior history of headaches. No history of cancer, night sweats, weight loss. Patient also reports shortness of breath with exertion that is intermittent. Denies chest pain, nausea, vomiting, diaphoresis, lightheadedness, syncope, fevers, chills, cough, congestion. Denies orthopnea, lower extremity edema. No prior history of DVT/PE.  Past Medical History  Diagnosis Date  . Asthma   . Post traumatic stress disorder   . Bipolar 1 disorder [facility])    Past Surgical History  Procedure Laterality Date  . Wrist surgery    . Tubal ligation     No family history on file. Social History  Substance Use Topics  . Smoking status: Current Every Day Smoker  . Smokeless tobacco: None  . Alcohol Use: Yes   OB History    No data available     Review of Systems  Constitutional: Negative for fever and appetite  change.  HENT: Negative for congestion.   Eyes: Positive for visual disturbance. Negative for photophobia, pain, discharge and redness.  Respiratory: Negative for cough, chest tightness and shortness of breath.   Cardiovascular: Negative for chest pain and leg swelling.  Gastrointestinal: Negative for vomiting, abdominal pain and diarrhea.  Genitourinary: Positive for dysuria and urgency. Negative for hematuria and difficulty urinating.  Musculoskeletal: Negative for back pain.  Skin: Negative for rash.  Neurological: Positive for headaches. Negative for dizziness, seizures, syncope, weakness, light-headedness and numbness.  Psychiatric/Behavioral: Negative for behavioral problems.      Allergies  Aspirin and Food  Home Medications   Prior to Admission medications   Medication Sig Start Date End Date Taking? Authorizing Provider  albuterol (PROVENTIL HFA;VENTOLIN HFA) 108 (90 BASE) MCG/ACT inhaler Inhale 2 puffs into the lungs every 6 (six) hours as needed for wheezing or shortness of breath.   Yes Historical Provider, MD  buPROPion (WELLBUTRIN) 75 MG tablet Take 75 mg by mouth 2 (two) times daily.   Yes Historical Provider, MD  diphenhydrAMINE (BENADRYL) 25 MG tablet Take 1 tablet (25 mg total) by mouth every 8 (eight) hours as needed for itching. 02/27/15  Yes [name], PA-C  ferrous sulfate 325 (65 FE) MG tablet Take 325 mg by mouth daily.   Yes Historical Provider, MD  hydrOXYzine (ATARAX/VISTARIL) 50 MG tablet Take 50 mg by mouth 2 (two) times daily as needed for anxiety.    Yes Historical Provider, MD  lithium carbonate 300 MG capsule Take 300 mg by mouth 3 (three) times daily.    Yes Historical Provider, MD  mometasone (  NASONEX) 50 MCG/ACT nasal spray Place 1 spray into both nostrils daily.    Yes Historical Provider, MD  omeprazole (PRILOSEC) 40 MG capsule Take 40 mg by mouth 2 (two) times daily.    Yes Historical Provider, MD  traZODone (DESYREL) 150 MG tablet Take 200 mg  by mouth at bedtime.   Yes Historical Provider, MD   BP 123/91 mmHg  Pulse 83  Temp(Src) 97.8 F (36.6 C) (Oral)  Resp 13  SpO2 97% Physical Exam  Constitutional: She is oriented to person, place, and time. She appears well-developed and well-nourished. No distress.  HENT:  Head: Normocephalic and atraumatic.  Mouth/Throat: Oropharynx is clear and moist.  No TTP of temporal arteries  Eyes: Conjunctivae and EOM are normal. Pupils are equal, round, and reactive to light. Right conjunctiva is not injected. Left conjunctiva is not injected.  Neck: Normal range of motion. Neck supple. No JVD present. No tracheal deviation present.  Cardiovascular: Normal rate, regular rhythm, normal heart sounds and intact distal pulses.   No murmur heard. Pulmonary/Chest: Effort normal and breath sounds normal. No respiratory distress. She has no wheezes.  Abdominal: Soft. She exhibits no distension. There is no tenderness.  Musculoskeletal: Normal range of motion.  Neurological: She is alert and oriented to person, place, and time. She has normal strength and normal reflexes. No cranial nerve deficit or sensory deficit. Coordination normal. GCS eye subscore is 4. GCS verbal subscore is 5. GCS motor subscore is 6.  Skin: Skin is warm. No pallor.  Psychiatric: She has a normal mood and affect.    ED Course  Procedures (including critical care time) Labs Review Labs Reviewed  BASIC METABOLIC PANEL - Abnormal; Notable for the following:    Potassium 3.4 (*)    Chloride 112 (*)    Glucose, Bld 107 (*)    All other components within normal limits  URINALYSIS, ROUTINE W REFLEX MICROSCOPIC (NOT AT [facility]) - Abnormal; Notable for the following:    APPearance CLOUDY (*)    Hgb urine dipstick SMALL (*)    Protein, ur 30 (*)    All other components within normal limits  URINE MICROSCOPIC-ADD ON - Abnormal; Notable for the following:    Squamous Epithelial / LPF 0-5 (*)    Bacteria, UA FEW (*)    All other  components within normal limits  SEDIMENTATION RATE    Imaging Review Dg Chest 2 View  10/20/2015  CLINICAL DATA:  Cough; hypertension EXAM: CHEST  2 VIEW COMPARISON:  None. FINDINGS: There is no edema or consolidation. The heart size and pulmonary vascularity are normal. No adenopathy. No bone lesions. IMPRESSION: No edema or consolidation. Electronically Signed   By: Lowella Grip III M.D.   On: 10/20/2015 20:25   Ct Head Wo Contrast  10/20/2015  CLINICAL DATA:  Shortness of Breath, hypertension, right parietal pain EXAM: CT HEAD WITHOUT CONTRAST TECHNIQUE: Contiguous axial images were obtained from the base of the skull through the vertex without intravenous contrast. COMPARISON:  None. FINDINGS: No skull fracture is noted. Paranasal sinuses shows probable mucous retention cyst right maxillary sinus measures 2 cm. The mastoid air cells are unremarkable. No intracranial hemorrhage, mass effect or midline shift. No acute cortical infarction. No mass lesion is noted on this unenhanced scan. The gray and white-matter differentiation is preserved. IMPRESSION: No acute intracranial abnormality. Probable mucous retention cyst right maxillary sinus measures 2 cm. Electronically Signed   By: Lahoma Crocker M.D.   On: 10/20/2015 20:48  I have personally reviewed and evaluated these images and lab results as part of my medical decision-making.   EKG Interpretation None      MDM   Final diagnoses:  Essential hypertension   Patient is a 49 year old female with past medical history significant for bipolar disorder, asthma, who presents to the emergency department with hypertension. On arrival, no acute distress, not ill appearing. Afebrile, BP 140/60s, HR 80s. Exam as above, notable for normal neuro exam, lungs CTAB, RRR, intact distal pulses. OS 20/25 OD 20/25.   Patient most likely with essential hypertension. CT head showed no signs of intracranial hemorrhage. CXR showed no acute findings. Cr 0.91.  No electrolyte abnormalities. ESR 6. EKG showed NSR, rate 61, normal intervals, no chamber enlargement, no signs of ischemia, no prior to compare.   Given headache cocktail (toradol, compazine, benadryl).   No signs of end organ damage. No signs of hypertensive encephalopathy. Doubt giant cell arteritis, normal ESR, no jaw claudication. Doubt acute angle glaucoma, pupils equal and reactivt, normal visual acuity as cause of headache. No signs of pulmonary edema or CHF. No signs of renal failure.   On re-evaluation, patient's headache has resolved.   Patient stable for discharge home. Discussed follow up with PCP in the next week for treatment of hypertension. Patient is on Lithium, will defer antihypertensive medications to the PCP. Discussed strict return precautions to the emergency department. Patient expressed understanding, no questions or concerns at time of discharge.    Nathaniel Man, MD 10/21/15 [place], MD 10/25/15 [phone]

## 2015-10-20 NOTE — ED Notes (Signed)
Patient transported to X-ray 

## 2015-10-20 NOTE — Discharge Instructions (Signed)
Hypertension Hypertension, commonly called high blood pressure, is when the force of blood pumping through your arteries is too strong. Your arteries are the blood vessels that carry blood from your heart throughout your body. A blood pressure reading consists of a higher number over a lower number, such as 110/72. The higher number (systolic) is the pressure inside your arteries when your heart pumps. The lower number (diastolic) is the pressure inside your arteries when your heart relaxes. Ideally you want your blood pressure below 120/80. Hypertension forces your heart to work harder to pump blood. Your arteries may become narrow or stiff. Having untreated or uncontrolled hypertension can cause heart attack, stroke, kidney disease, and other problems. RISK FACTORS Some risk factors for high blood pressure are controllable. Others are not.  Risk factors you cannot control include:   Race. You may be at higher risk if you are African American.  Age. Risk increases with age.  Gender. Men are at higher risk than women before age 45 years. After age 65, women are at higher risk than men. Risk factors you can control include:  Not getting enough exercise or physical activity.  Being overweight.  Getting too much fat, sugar, calories, or salt in your diet.  Drinking too much alcohol. SIGNS AND SYMPTOMS Hypertension does not usually cause signs or symptoms. Extremely high blood pressure (hypertensive crisis) may cause headache, anxiety, shortness of breath, and nosebleed. DIAGNOSIS To check if you have hypertension, your health care provider will measure your blood pressure while you are seated, with your arm held at the level of your heart. It should be measured at least twice using the same arm. Certain conditions can cause a difference in blood pressure between your right and left arms. A blood pressure reading that is higher than normal on one occasion does not mean that you need treatment. If  it is not clear whether you have high blood pressure, you may be asked to return on a different day to have your blood pressure checked again. Or, you may be asked to monitor your blood pressure at home for 1 or more weeks. TREATMENT Treating high blood pressure includes making lifestyle changes and possibly taking medicine. Living a healthy lifestyle can help lower high blood pressure. You may need to change some of your habits. Lifestyle changes may include:  Following the DASH diet. This diet is high in fruits, vegetables, and whole grains. It is low in salt, red meat, and added sugars.  Keep your sodium intake below 2,300 mg per day.  Getting at least 30-45 minutes of aerobic exercise at least 4 times per week.  Losing weight if necessary.  Not smoking.  Limiting alcoholic beverages.  Learning ways to reduce stress. Your health care provider may prescribe medicine if lifestyle changes are not enough to get your blood pressure under control, and if one of the following is true:  You are 18-59 years of age and your systolic blood pressure is above 140.  You are 60 years of age or older, and your systolic blood pressure is above 150.  Your diastolic blood pressure is above 90.  You have diabetes, and your systolic blood pressure is over 140 or your diastolic blood pressure is over 90.  You have kidney disease and your blood pressure is above 140/90.  You have heart disease and your blood pressure is above 140/90. Your personal target blood pressure may vary depending on your medical conditions, your age, and other factors. HOME CARE INSTRUCTIONS    Have your blood pressure rechecked as directed by your health care provider.   Take medicines only as directed by your health care provider. Follow the directions carefully. Blood pressure medicines must be taken as prescribed. The medicine does not work as well when you skip doses. Skipping doses also puts you at risk for  problems.  Do not smoke.   Monitor your blood pressure at home as directed by your health care provider. SEEK MEDICAL CARE IF:   You think you are having a reaction to medicines taken.  You have recurrent headaches or feel dizzy.  You have swelling in your ankles.  You have trouble with your vision. SEEK IMMEDIATE MEDICAL CARE IF:  You develop a severe headache or confusion.  You have unusual weakness, numbness, or feel faint.  You have severe chest or abdominal pain.  You vomit repeatedly.  You have trouble breathing. MAKE SURE YOU:   Understand these instructions.  Will watch your condition.  Will get help right away if you are not doing well or get worse.   This information is not intended to replace advice given to you by your health care provider. Make sure you discuss any questions you have with your health care provider.   Document Released: 10/28/2005 Document Revised: 03/14/2015 Document Reviewed: 08/20/2013 Elsevier Interactive Patient Education 2016 [employer]  

## 2015-10-20 NOTE — ED Notes (Signed)
Pt went to [facility] yesterday and had high BP, checked her Bp again at [facility] today, 191/97. Denies hx of HTN. Pt c/o HA. Ambulatory with steady gait, no focal neuro deficits.

## 2015-10-21 NOTE — ED Notes (Signed)
Reviewed d/c instructions with pt, who voiced understanding. Pt departed in NAD.

## 2016-01-18 ENCOUNTER — Encounter (HOSPITAL_COMMUNITY): Payer: Self-pay

## 2016-01-18 DIAGNOSIS — K625 Hemorrhage of anus and rectum: Secondary | ICD-10-CM | POA: Insufficient documentation

## 2016-01-18 DIAGNOSIS — F17200 Nicotine dependence, unspecified, uncomplicated: Secondary | ICD-10-CM | POA: Insufficient documentation

## 2016-01-18 DIAGNOSIS — J45909 Unspecified asthma, uncomplicated: Secondary | ICD-10-CM | POA: Insufficient documentation

## 2016-01-18 LAB — CBC
HCT: 38.3 % (ref 36.0–46.0)
Hemoglobin: 12 g/dL (ref 12.0–15.0)
MCH: 27.7 pg (ref 26.0–34.0)
MCHC: 31.3 g/dL (ref 30.0–36.0)
MCV: 88.5 fL (ref 78.0–100.0)
PLATELETS: 333 10*3/uL (ref 150–400)
RBC: 4.33 MIL/uL (ref 3.87–5.11)
RDW: 14.9 % (ref 11.5–15.5)
WBC: 9.2 10*3/uL (ref 4.0–10.5)

## 2016-01-18 LAB — COMPREHENSIVE METABOLIC PANEL
ALK PHOS: 59 U/L (ref 38–126)
ALT: 21 U/L (ref 14–54)
ANION GAP: 12 (ref 5–15)
AST: 28 U/L (ref 15–41)
Albumin: 3.7 g/dL (ref 3.5–5.0)
BILIRUBIN TOTAL: 0.3 mg/dL (ref 0.3–1.2)
BUN: 9 mg/dL (ref 6–20)
CALCIUM: 9.1 mg/dL (ref 8.9–10.3)
CO2: 26 mmol/L (ref 22–32)
CREATININE: 1.22 mg/dL — AB (ref 0.44–1.00)
Chloride: 106 mmol/L (ref 101–111)
GFR calc non Af Amer: 50 mL/min — ABNORMAL LOW (ref >60)
GFR, EST AFRICAN AMERICAN: 58 mL/min — AB (ref >60)
Glucose, Bld: 92 mg/dL (ref 65–99)
Potassium: 3.9 mmol/L (ref 3.5–5.1)
SODIUM: 144 mmol/L (ref 135–145)
TOTAL PROTEIN: 7 g/dL (ref 6.5–8.1)

## 2016-01-18 LAB — TYPE AND SCREEN
ABO/RH(D): O NEG
ANTIBODY SCREEN: NEGATIVE

## 2016-01-18 LAB — ABO/RH: ABO/RH(D): O NEG

## 2016-01-18 NOTE — ED Notes (Signed)
Pt reports she is here today due to rectal bleeding; it started about 2 weeks ago. She states she has noticed "slimy" dark red blood and has pain to her rectum and umbilicus.

## 2016-01-18 NOTE — ED Notes (Signed)
Pt left post triage.  Stated she doesn't want to wait any longer and is going to try [employer].

## 2016-01-19 ENCOUNTER — Emergency Department (HOSPITAL_COMMUNITY)
Admission: EM | Admit: 2016-01-19 | Discharge: 2016-01-19 | Disposition: A | Discharge status: Home / Self Care | Payer: BLUE CROSS/BLUE SHIELD | Attending: Emergency Medicine | Admitting: Emergency Medicine

## 2016-01-19 NOTE — ED Notes (Signed)
Pt observed sitting in waiting room and has not left this ED.

## 2016-01-19 NOTE — ED Notes (Signed)
Pt observed ambulating out of ED with steady gait, NAD

## 2016-02-03 ENCOUNTER — Emergency Department (HOSPITAL_COMMUNITY): Payer: BLUE CROSS/BLUE SHIELD

## 2016-02-03 ENCOUNTER — Emergency Department (HOSPITAL_COMMUNITY)
Admission: EM | Admit: 2016-02-03 | Discharge: 2016-02-03 | Disposition: A | Discharge status: Home / Self Care | Payer: BLUE CROSS/BLUE SHIELD | Attending: Emergency Medicine | Admitting: Emergency Medicine

## 2016-02-03 ENCOUNTER — Encounter (HOSPITAL_COMMUNITY): Payer: Self-pay

## 2016-02-03 DIAGNOSIS — M25562 Pain in left knee: Secondary | ICD-10-CM

## 2016-02-03 DIAGNOSIS — J45909 Unspecified asthma, uncomplicated: Secondary | ICD-10-CM | POA: Diagnosis not present

## 2016-02-03 DIAGNOSIS — F17210 Nicotine dependence, cigarettes, uncomplicated: Secondary | ICD-10-CM | POA: Diagnosis not present

## 2016-02-03 DIAGNOSIS — F319 Bipolar disorder, unspecified: Secondary | ICD-10-CM | POA: Insufficient documentation

## 2016-02-03 DIAGNOSIS — Z79899 Other long term (current) drug therapy: Secondary | ICD-10-CM | POA: Insufficient documentation

## 2016-02-03 DIAGNOSIS — R2242 Localized swelling, mass and lump, left lower limb: Secondary | ICD-10-CM | POA: Insufficient documentation

## 2016-02-03 DIAGNOSIS — Z7951 Long term (current) use of inhaled steroids: Secondary | ICD-10-CM | POA: Insufficient documentation

## 2016-02-03 LAB — D-DIMER, QUANTITATIVE (NOT AT ARMC)

## 2016-02-03 MED ORDER — METHOCARBAMOL 500 MG PO TABS
1000.0000 mg | ORAL_TABLET | Freq: Once | ORAL | Status: AC
  Administered 2016-02-03: 1000 mg via ORAL
  Filled 2016-02-03: qty 2

## 2016-02-03 MED ORDER — KETOROLAC TROMETHAMINE 60 MG/2ML IM SOLN
60.0000 mg | Freq: Once | INTRAMUSCULAR | Status: AC
  Administered 2016-02-03: 60 mg via INTRAMUSCULAR
  Filled 2016-02-03: qty 2

## 2016-02-03 MED ORDER — METHOCARBAMOL 500 MG PO TABS: 500.0000 mg | ORAL_TABLET | Freq: Two times a day (BID) | ORAL | Status: DC

## 2016-02-03 NOTE — ED Provider Notes (Signed)
CSN: [id number]     Arrival date & time 02/03/16  1337 History  By signing my name below, I, Denise Knight, attest that this documentation has been prepared under the direction and in the presence of Denise Lions PA-C. Electronically Signed: Rayna Knight, ED Scribe. 02/03/2016. 2:55 PM.   Chief Complaint  Patient presents with  . Knee Pain   The history is provided by the patient. No language interpreter was used.   HPI Comments: Denise Knight is a 50 y.o. female who presents to the Emergency Department complaining of constant, moderate, atraumatic, left anterior knee pain onset 8 days ago. She reports mild swelling and DROM to her left knee noting her left calf swelled 1 day ago and has mostly alleviated. Her pain worsens when standing from a seated position and slightly alleviates when ambulating. Pt has applied heat/ice and taken Aleve without significant relief of her pain. She denies any recent surgeries, being on estrogen birth control medications or a PMHx of DVT/PE or gout. She confirms her allergy to aspirin noting she breaks out in hives. Pt denies fevers, chills, CP, acute SOB or any other associated symptoms at this time.    Past Medical History  Diagnosis Date  . Asthma   . Post traumatic stress disorder   . Bipolar 1 disorder [facility])    Past Surgical History  Procedure Laterality Date  . Wrist surgery    . Tubal ligation     No family history on file. Social History  Substance Use Topics  . Smoking status: Current Every Day Smoker    Types: Cigarettes  . Smokeless tobacco: None  . Alcohol Use: Yes     Comment: occassional   OB History    No data available     Review of Systems A complete 10 system review of systems was obtained and all systems are negative except as noted in the HPI and PMH.   Allergies  Aspirin and Food  Home Medications   Prior to Admission medications   Medication Sig Start Date End Date Taking? Authorizing Provider   albuterol (PROVENTIL HFA;VENTOLIN HFA) 108 (90 BASE) MCG/ACT inhaler Inhale 2 puffs into the lungs every 6 (six) hours as needed for wheezing or shortness of breath.    Historical Provider, MD  buPROPion (WELLBUTRIN) 75 MG tablet Take 75 mg by mouth 2 (two) times daily.    Historical Provider, MD  diphenhydrAMINE (BENADRYL) 25 MG tablet Take 1 tablet (25 mg total) by mouth every 8 (eight) hours as needed for itching. 02/27/15   [name], PA-C  ferrous sulfate 325 (65 FE) MG tablet Take 325 mg by mouth daily.    Historical Provider, MD  hydrOXYzine (ATARAX/VISTARIL) 50 MG tablet Take 50 mg by mouth 2 (two) times daily as needed for anxiety.     Historical Provider, MD  lithium carbonate 300 MG capsule Take 300 mg by mouth 3 (three) times daily.     Historical Provider, MD  mometasone (NASONEX) 50 MCG/ACT nasal spray Place 1 spray into both nostrils daily.     Historical Provider, MD  omeprazole (PRILOSEC) 40 MG capsule Take 40 mg by mouth 2 (two) times daily.     Historical Provider, MD  traZODone (DESYREL) 150 MG tablet Take 200 mg by mouth at bedtime.    Historical Provider, MD   BP 117/78 mmHg  Pulse 79  Temp(Src) 97.7 F (36.5 C) (Oral)  Resp 20  Ht 5' 8.5" (1.74 m)  Wt 184 lb (83.462  kg)  BMI 27.57 kg/m2  SpO2 99%   Physical Exam  Constitutional: She is oriented to person, place, and time. She appears well-developed and well-nourished.  HENT:  Head: Normocephalic and atraumatic.  Eyes: EOM are normal.  Neck: Normal range of motion.  Cardiovascular: Normal rate.   Pulmonary/Chest: Effort normal. No respiratory distress.  Abdominal: Soft.  Musculoskeletal: Normal range of motion. She exhibits tenderness. She exhibits no edema.  Left knee without effusion, crepitus. Limited ROM at left knee due to pain. Diffuse tenderness along left patella  Neurological: She is alert and oriented to person, place, and time.  Skin: Skin is warm and dry.  Psychiatric: She has a normal mood  and affect.  Nursing note and vitals reviewed.  ED Course  Procedures  DIAGNOSTIC STUDIES: Oxygen Saturation is 99% on RA, normal by my interpretation.    COORDINATION OF CARE: 2:50 PM Discussed next steps with pt. She verbalized understanding and  Labs Review Labs Reviewed  D-DIMER, QUANTITATIVE (NOT AT [facility])   Imaging Review Dg Knee Complete 4 Views Left  02/03/2016  CLINICAL DATA:  Extreme pain in knee for 8 days.  No known injury. EXAM: LEFT KNEE - COMPLETE 4+ VIEW COMPARISON:  None. FINDINGS: No fracture of the proximal tibia or distal femur. Patella is normal. No joint effusion. IMPRESSION: No fracture or dislocation. Electronically Signed   By: Suzy Bouchard M.D.   On: 02/03/2016 16:15   I have personally reviewed and evaluated these images and lab results as part of my medical decision-making.  MDM   Final diagnoses:  Left knee pain   Patient nontoxic appearing, VSS. D-dimer, left knee XR negative. Less likely infectious etiology. No effusion. Will apply knee sleeve and provide symptomatic relief. Patient may be safely discharged home. Discussed reasons for return. Patient to follow-up with primary care provider within one week. Patient in understanding and agreement with the plan.   I personally performed the services described in this documentation, which was scribed in my presence. The recorded information has been reviewed and is accurate.   Seguin Lions, PA-C 02/04/16 [place], MD 02/04/16 [phone]

## 2016-02-03 NOTE — ED Notes (Signed)
Lt. Top of knee having pain, denies injury, Began 8 days ago.  Pt. Has tried ice and heat and Aleve.  Nothing has help.  Pt. Reports that standing up increases the pain, but once she begins to walk the pain decreases.  She is unable to bend the knee.

## 2016-02-03 NOTE — Discharge Instructions (Signed)
Ms. Avanna Sowder,  Nice meeting you! Please follow-up with your primary care provider. Return to the emergency department if you develop increased pain, chest pain, shortness of breath, color changes in your leg, increased swelling. Feel better soon!  S. Wendie Simmer, PA-C

## 2016-02-03 NOTE — ED Notes (Signed)
Declined W/C at D/C and was escorted to lobby by RN. 

## 2016-02-13 ENCOUNTER — Encounter: Payer: Self-pay | Admitting: Gastroenterology

## 2016-03-03 ENCOUNTER — Emergency Department (HOSPITAL_COMMUNITY)
Admission: EM | Admit: 2016-03-03 | Discharge: 2016-03-03 | Disposition: A | Discharge status: Home / Self Care | Payer: BLUE CROSS/BLUE SHIELD | Attending: Emergency Medicine | Admitting: Emergency Medicine

## 2016-03-03 ENCOUNTER — Encounter (HOSPITAL_COMMUNITY): Payer: Self-pay | Admitting: Nurse Practitioner

## 2016-03-03 ENCOUNTER — Emergency Department (HOSPITAL_COMMUNITY): Payer: BLUE CROSS/BLUE SHIELD

## 2016-03-03 ENCOUNTER — Emergency Department (HOSPITAL_BASED_OUTPATIENT_CLINIC_OR_DEPARTMENT_OTHER): Payer: BLUE CROSS/BLUE SHIELD

## 2016-03-03 DIAGNOSIS — J45909 Unspecified asthma, uncomplicated: Secondary | ICD-10-CM | POA: Diagnosis not present

## 2016-03-03 DIAGNOSIS — M79609 Pain in unspecified limb: Secondary | ICD-10-CM

## 2016-03-03 DIAGNOSIS — Z79899 Other long term (current) drug therapy: Secondary | ICD-10-CM | POA: Diagnosis not present

## 2016-03-03 DIAGNOSIS — Z7951 Long term (current) use of inhaled steroids: Secondary | ICD-10-CM | POA: Diagnosis not present

## 2016-03-03 DIAGNOSIS — F17210 Nicotine dependence, cigarettes, uncomplicated: Secondary | ICD-10-CM | POA: Insufficient documentation

## 2016-03-03 DIAGNOSIS — F319 Bipolar disorder, unspecified: Secondary | ICD-10-CM | POA: Diagnosis not present

## 2016-03-03 DIAGNOSIS — M25562 Pain in left knee: Secondary | ICD-10-CM | POA: Diagnosis not present

## 2016-03-03 MED ORDER — NAPROXEN 500 MG PO TABS: 500.0000 mg | ORAL_TABLET | Freq: Two times a day (BID) | ORAL | Status: DC

## 2016-03-03 MED ORDER — KETOROLAC TROMETHAMINE 60 MG/2ML IM SOLN
60.0000 mg | Freq: Once | INTRAMUSCULAR | Status: AC
  Administered 2016-03-03: 60 mg via INTRAMUSCULAR
  Filled 2016-03-03: qty 2

## 2016-03-03 NOTE — ED Provider Notes (Signed)
CSN: [id number]     Arrival date & time 03/03/16  1223 History  By signing my name below, I, Denise Knight, attest that this documentation has been prepared under the direction and in the presence of [name], PA-C Electronically Signed: Soijett Knight, ED Scribe. 03/03/2016. 2:02 PM.   Chief Complaint  Patient presents with  . Knee Pain   The history is provided by the patient. No language interpreter was used.   Denise Knight is a 50 y.o. female who presents to the Emergency Department complaining of posterior left knee pain onset 1 month worsening recently. Pt reports that she was seen in the ED for her pain initially with a negative left knee xray. Pt notes that her current pain is similar to her pain 1 month ago. She denies inciting event. She reports associated swelling of the knee and painful ROM. Pt reports that she noticed red splotches to her right thigh while in the ED waiting area, pt has multiple heating pads to the area applied by triage nurse. Pt denies any injury or trauma to the knee. Pt reports that her knee pain is worsened with ambulation. Pt is having associated symptoms of gait problem due to pain. She notes that she has tried aleve, motrin, with no relief of her symptoms. Pt denies recent travel, immobilization, surgery, estrogen use, birth control use, or PMHx of blood clots. Pt notes that her mother died of a PE 3 years ago. Denies history of gout or other arthritic conditions. Denies chest pain, shortness of breath, hemoptysis, posterior calf pain, unilateral leg swelling, knee redness, knee warmth or any other complaints today.  Per pt chart review: Pt was seen in the ED on 02/04/16 for left knee pain. Pt had left knee xray imaging completed with negative results. Pt was Rx robaxin for their symptoms.    Past Medical History  Diagnosis Date  . Asthma   . Post traumatic stress disorder   . Bipolar 1 disorder [facility])    Past Surgical History  Procedure Laterality Date   . Wrist surgery    . Tubal ligation     History reviewed. No pertinent family history. Social History  Substance Use Topics  . Smoking status: Current Every Day Smoker    Types: Cigarettes  . Smokeless tobacco: None  . Alcohol Use: Yes     Comment: occassional   OB History    No data available     Review of Systems  Musculoskeletal: Positive for arthralgias and gait problem. Negative for joint swelling.  Skin: Negative for color change and wound.  All other systems reviewed and are negative.     Allergies  Aspirin and Food  Home Medications   Prior to Admission medications   Medication Sig Start Date End Date Taking? Authorizing Provider  albuterol (PROVENTIL HFA;VENTOLIN HFA) 108 (90 BASE) MCG/ACT inhaler Inhale 2 puffs into the lungs every 6 (six) hours as needed for wheezing or shortness of breath.    Historical Provider, MD  buPROPion (WELLBUTRIN) 75 MG tablet Take 75 mg by mouth 2 (two) times daily.    Historical Provider, MD  diphenhydrAMINE (BENADRYL) 25 MG tablet Take 1 tablet (25 mg total) by mouth every 8 (eight) hours as needed for itching. 02/27/15   [name], PA-C  ferrous sulfate 325 (65 FE) MG tablet Take 325 mg by mouth daily.    Historical Provider, MD  hydrOXYzine (ATARAX/VISTARIL) 50 MG tablet Take 50 mg by mouth 2 (two) times daily as needed  for anxiety.     Historical Provider, MD  lithium carbonate 300 MG capsule Take 300 mg by mouth 3 (three) times daily.     Historical Provider, MD  methocarbamol (ROBAXIN) 500 MG tablet Take 1 tablet (500 mg total) by mouth 2 (two) times daily. 02/03/16   [place] Lions, PA-C  mometasone (NASONEX) 50 MCG/ACT nasal spray Place 1 spray into both nostrils daily.     Historical Provider, MD  naproxen (NAPROSYN) 500 MG tablet Take 1 tablet (500 mg total) by mouth 2 (two) times daily. 03/03/16   [name], PA-C  omeprazole (PRILOSEC) 40 MG capsule Take 40 mg by mouth 2 (two) times daily.     Historical  Provider, MD  traZODone (DESYREL) 150 MG tablet Take 200 mg by mouth at bedtime.    Historical Provider, MD   BP 124/79 mmHg  Pulse 90  Temp(Src) 98.4 F (36.9 C) (Oral)  Resp 18  Ht 5' 8"  (1.727 m)  Wt 85.276 kg  BMI 28.59 kg/m2  SpO2 99% Physical Exam  Constitutional: She appears well-developed and well-nourished. No distress.  HENT:  Head: Normocephalic and atraumatic.  Right Ear: External ear normal.  Left Ear: External ear normal.  Eyes: Conjunctivae are normal. Right eye exhibits no discharge. Left eye exhibits no discharge. No scleral icterus.  Neck: Normal range of motion.  Cardiovascular: Normal rate and intact distal pulses.   Pedal pulses palpable.   Pulmonary/Chest: Effort normal.  Musculoskeletal: Normal range of motion.  Generalized TTP of left popliteal fossa and anterior knee. Limited range of motion of left knee on testing but patient moves knee freely with full range of motion when rearranging herself on the stretcher during interview. No appreciable effusion or deformity. No warmth of the joint. Mild erythema noted to medial distal thigh, but pt previously had heating pad directly on the skin in this area. No erythema of the knee joint.   Neurological: She is alert. Coordination normal.  Deferred strength testing secondary to pain. Sensation to light touch intact over lower extremities.   Skin: Skin is warm and dry.  Psychiatric: She has a normal mood and affect. Her behavior is normal.  Nursing note and vitals reviewed.   ED Course  Procedures (including critical care time) DIAGNOSTIC STUDIES: Oxygen Saturation is 99% on RA, nl by my interpretation.    COORDINATION OF CARE: 2:02 PM Discussed treatment plan with pt at bedside which includes toradol injection, left knee xray, VAS [place] LE DVT, and pt agreed to plan.    Labs Review Labs Reviewed - No data to display  Imaging Review Dg Knee Complete 4 Views Left  03/03/2016  CLINICAL DATA:  Left knee pain  for over 1 month.  No known injury. EXAM: LEFT KNEE - COMPLETE 4+ VIEW COMPARISON:  02/03/2016 FINDINGS: There is no evidence of fracture, dislocation, or joint effusion. There is no evidence of arthropathy or other focal bone abnormality. Soft tissues are unremarkable. IMPRESSION: Negative. Electronically Signed   By: Earle Gell M.D.   On: 03/03/2016 14:40   I have personally reviewed and evaluated these images as part of my medical decision-making.   EKG Interpretation None      MDM   Final diagnoses:  Left knee pain   50 year old female presenting with left knee pain 1 month. Atraumatic. Initially tachycardic at 119; after receiving pain medication and resting in bed tachycardia resolved. Patient is nontoxic-appearing. Tenderness over anterior and posterior knee. No effusion or deformity. Left lower extremity  is neurovascularly intact. Given initial tachycardia, a Doppler was performed. No evidence of DVT or Baker's cyst. Left knee x-ray unchanged from visit 3 weeks ago. No indication there is septic joint. Offered knee sleeve which patient declined. Offered crutches for comfort which patient accepted. Discussed Rice therapy and anti-inflammatories for the generally. I have also provided patient with orthopedics follow-up information. Patient is to follow up with her PCP as needed. Return precautions given in discharge paperwork and discussed with pt at bedside. Pt stable for discharge  I personally performed the services described in this documentation, which was scribed in my presence. The recorded information has been reviewed and is accurate.   Lahoma Crocker Owenn Rothermel, PA-C 03/03/16 1641  Sherwood Gambler, MD 03/04/16 [phone]

## 2016-03-03 NOTE — Progress Notes (Signed)
*  PRELIMINARY RESULTS* Vascular Ultrasound Left lower extremity venous duplex has been completed.  Preliminary findings: No evidence of DVT or baker's cyst.   Landry Mellow, RDMS, RVT  03/03/2016, 3:32 PM

## 2016-03-03 NOTE — ED Notes (Signed)
Pt c/o 1 month history L knee pain and swelling. She was seen here for this pain but states she was given nothing to take at home for the pain and the pain persists. She denies any injuries.

## 2016-03-03 NOTE — ED Notes (Signed)
Declined W/C at D/C and was escorted to lobby by RN. 

## 2016-03-03 NOTE — ED Notes (Signed)
PT refused knee sleeve

## 2016-03-03 NOTE — Discharge Instructions (Signed)
Joint Pain Joint pain, which is also called arthralgia, can be caused by many things. Joint pain often goes away when you follow your health care provider's instructions for relieving pain at home. However, joint pain can also be caused by conditions that require further treatment. Common causes of joint pain include:  Bruising in the area of the joint.  Overuse of the joint.  Wear and tear on the joints that occur with aging (osteoarthritis).  Various other forms of arthritis.  A buildup of a crystal form of uric acid in the joint (gout).  Infections of the joint (septic arthritis) or of the bone (osteomyelitis). Your health care provider may recommend medicine to help with the pain. If your joint pain continues, additional tests may be needed to diagnose your condition. HOME CARE INSTRUCTIONS Watch your condition for any changes. Follow these instructions as directed to lessen the pain that you are feeling.  Take medicines only as directed by your health care provider.  Rest the affected area for as long as your health care provider says that you should. If directed to do so, raise the painful joint above the level of your heart while you are sitting or lying down.  Do not do things that cause or worsen pain.  If directed, apply ice to the painful area:  Put ice in a plastic bag.  Place a towel between your skin and the bag.  Leave the ice on for 20 minutes, 2-3 times per day.  Wear an elastic bandage, splint, or sling as directed by your health care provider. Loosen the elastic bandage or splint if your fingers or toes become numb and tingle, or if they turn cold and blue.  Begin exercising or stretching the affected area as directed by your health care provider. Ask your health care provider what types of exercise are safe for you.  Keep all follow-up visits as directed by your health care provider. This is important. SEEK MEDICAL CARE IF:  Your pain increases, and medicine  does not help.  Your joint pain does not improve within 3 days.  You have increased bruising or swelling.  You have a fever.  You lose 10 lb (4.5 kg) or more without trying. SEEK IMMEDIATE MEDICAL CARE IF:  You are not able to move the joint.  Your fingers or toes become numb or they turn cold and blue.   This information is not intended to replace advice given to you by your health care provider. Make sure you discuss any questions you have with your health care provider.   Document Released: 10/28/2005 Document Revised: 11/18/2014 Document Reviewed: 08/09/2014 Elsevier Interactive Patient Education [employer].

## 2016-04-10 ENCOUNTER — Encounter: Payer: Self-pay | Admitting: Emergency Medicine

## 2016-04-10 ENCOUNTER — Ambulatory Visit (INDEPENDENT_AMBULATORY_CARE_PROVIDER_SITE_OTHER): Payer: BLUE CROSS/BLUE SHIELD | Admitting: Gastroenterology

## 2016-04-10 VITALS — BP 124/70 | HR 68 | Ht 68.5 in | Wt 181.0 lb

## 2016-04-10 DIAGNOSIS — R195 Other fecal abnormalities: Secondary | ICD-10-CM

## 2016-04-10 NOTE — Progress Notes (Signed)
HPI: This is a    very pleasant 50 year old woman   who was referred to me by [facility]  to evaluate  Hemoccult-positive stool .    Chief complaint is Hemoccult-positive stool  Labs 01/2016 normal cbc, cmet except Cr 1.2 FOBT + 2016  She sees blood in stool with spicy foods, hemorrhoidal flares  Feels tissue at backside.  Sees blood about once a month.  No trouble with her bowels.  Mom had colon cancer, not sure how old she was at diagnosis.  Not sure how it was treated.  Colonoscopy at [facility] years.  SHe recalls that 3 polyps were removed.  Takes one omeprazole once daily. This controls her mild chronic GERD symptoms very well. She has no dysphagia.   Review of systems: Pertinent positive and negative review of systems were noted in the above HPI section. Complete review of systems was performed and was otherwise normal.   Past Medical History  Diagnosis Date  . Asthma   . Post traumatic stress disorder   . Bipolar 1 disorder ([place])   . GERD (gastroesophageal reflux disease)   . Celiac disease     Past Surgical History  Procedure Laterality Date  . Wrist surgery    . Tubal ligation    . Foot surgery      Current Outpatient Prescriptions  Medication Sig Dispense Refill  . albuterol (PROVENTIL HFA;VENTOLIN HFA) 108 (90 BASE) MCG/ACT inhaler Inhale 2 puffs into the lungs every 6 (six) hours as needed for wheezing or shortness of breath.    Marland Kitchen buPROPion (WELLBUTRIN) 75 MG tablet Take 75 mg by mouth 2 (two) times daily.    . diphenhydrAMINE (BENADRYL) 25 MG tablet Take 1 tablet (25 mg total) by mouth every 8 (eight) hours as needed for itching. 9 tablet 0  . ferrous sulfate 325 (65 FE) MG tablet Take 325 mg by mouth daily.    . hydrochlorothiazide (HYDRODIURIL) 12.5 MG tablet Take 12.5 mg by mouth daily.    . hydrOXYzine (ATARAX/VISTARIL) 50 MG tablet Take 50 mg by mouth 2 (two) times daily as needed for anxiety.     Marland Kitchen lithium  carbonate 300 MG capsule Take 300 mg by mouth 3 (three) times daily.     . methocarbamol (ROBAXIN) 500 MG tablet Take 1 tablet (500 mg total) by mouth 2 (two) times daily. 20 tablet 0  . mometasone (NASONEX) 50 MCG/ACT nasal spray Place 1 spray into both nostrils daily.     . naproxen (NAPROSYN) 500 MG tablet Take 1 tablet (500 mg total) by mouth 2 (two) times daily. 30 tablet 0  . omeprazole (PRILOSEC) 40 MG capsule Take 40 mg by mouth 2 (two) times daily.     . traZODone (DESYREL) 150 MG tablet Take 200 mg by mouth at bedtime.     No current facility-administered medications for this visit.    Allergies as of 04/10/2016 - Review Complete 04/10/2016  Allergen Reaction Noted  . Aspirin Hives 03/17/2014  . Food Hives 06/21/2015    Family History  Problem Relation Age of Onset  . Breast cancer    . Lung cancer    . Congestive Heart Failure    . Diabetes    . Hypertension      Social History   Social History  . Marital Status: Married    Spouse Name: N/A  . Number of Children: 2  . Years of Education: N/A   Occupational History  .  disabled     Social History Main Topics  . Smoking status: Current Every Day Smoker    Types: Cigarettes  . Smokeless tobacco: Not on file  . Alcohol Use: Yes     Comment: occassional  . Drug Use: No  . Sexual Activity: Yes    Birth Control/ Protection: Surgical   Other Topics Concern  . Not on file   Social History Narrative     Physical Exam: BP 124/70 mmHg  Pulse 68  Ht 5' 8.5" (1.74 m)  Wt 181 lb (82.101 kg)  BMI 27.12 kg/m2 Constitutional: generally well-appearing Psychiatric: alert and oriented x3 Eyes: extraocular movements intact Mouth: oral pharynx moist, no lesions Neck: supple no lymphadenopathy Cardiovascular: heart regular rate and rhythm Lungs: clear to auscultation bilaterally Abdomen: soft, nontender, nondistended, no obvious ascites, no peritoneal signs, normal bowel sounds Extremities: no lower extremity  edema bilaterally Skin: no lesions on visible extremities   Assessment and plan: 50 y.o. female with  Hemoccult-positive stool, family history of colon cancer, personal history of polyps  She tells me her mother had colon cancer and she has had polyps before. Her last colonoscopy was about 5 years ago. She is not sure when her mother had her colon cancer her what age she was. She was Hemoccult positive late 2016 and for all these reasons I recommended we repeat colonoscopy at her soonest convenience. I see no reason for any further blood tests or imaging studies prior to then.   Owens Loffler, MD [facility] 04/10/2016, 2:51 PM  Cc: [facility]

## 2016-04-10 NOTE — Patient Instructions (Addendum)
You will be set up for a colonoscopy for FH of colon cancer, personal history of polyps, FOBT + stools

## 2016-04-12 ENCOUNTER — Other Ambulatory Visit: Payer: Self-pay | Admitting: Orthopedic Surgery

## 2016-04-12 DIAGNOSIS — S83207A Unspecified tear of unspecified meniscus, current injury, left knee, initial encounter: Secondary | ICD-10-CM

## 2016-04-18 ENCOUNTER — Ambulatory Visit
Admission: RE | Admit: 2016-04-18 | Discharge: 2016-04-18 | Disposition: A | Discharge status: Home / Self Care | Payer: BLUE CROSS/BLUE SHIELD | Source: Ambulatory Visit | Attending: Orthopedic Surgery | Admitting: Orthopedic Surgery

## 2016-04-18 DIAGNOSIS — S83207A Unspecified tear of unspecified meniscus, current injury, left knee, initial encounter: Secondary | ICD-10-CM

## 2016-06-20 ENCOUNTER — Telehealth: Payer: Self-pay | Admitting: Gastroenterology

## 2016-06-21 ENCOUNTER — Encounter: Payer: BLUE CROSS/BLUE SHIELD | Admitting: Gastroenterology

## 2016-07-05 ENCOUNTER — Inpatient Hospital Stay (HOSPITAL_COMMUNITY)
Admission: EM | Admit: 2016-07-05 | Discharge: 2016-07-07 | DRG: 287 | Disposition: A | Discharge status: Home / Self Care | Payer: Self-pay | Attending: Cardiology | Admitting: Cardiology

## 2016-07-05 ENCOUNTER — Emergency Department (HOSPITAL_COMMUNITY): Payer: Self-pay

## 2016-07-05 ENCOUNTER — Encounter (HOSPITAL_COMMUNITY)
Admission: EM | Disposition: A | Discharge status: Home / Self Care | Payer: Self-pay | Source: Home / Self Care | Attending: Cardiology

## 2016-07-05 ENCOUNTER — Encounter (HOSPITAL_COMMUNITY): Payer: Self-pay | Admitting: *Deleted

## 2016-07-05 DIAGNOSIS — K900 Celiac disease: Secondary | ICD-10-CM | POA: Diagnosis present

## 2016-07-05 DIAGNOSIS — Z803 Family history of malignant neoplasm of breast: Secondary | ICD-10-CM

## 2016-07-05 DIAGNOSIS — F17210 Nicotine dependence, cigarettes, uncomplicated: Secondary | ICD-10-CM | POA: Diagnosis present

## 2016-07-05 DIAGNOSIS — Z8249 Family history of ischemic heart disease and other diseases of the circulatory system: Secondary | ICD-10-CM

## 2016-07-05 DIAGNOSIS — F319 Bipolar disorder, unspecified: Secondary | ICD-10-CM | POA: Diagnosis present

## 2016-07-05 DIAGNOSIS — Z833 Family history of diabetes mellitus: Secondary | ICD-10-CM

## 2016-07-05 DIAGNOSIS — Z886 Allergy status to analgesic agent status: Secondary | ICD-10-CM

## 2016-07-05 DIAGNOSIS — F4310 Post-traumatic stress disorder, unspecified: Secondary | ICD-10-CM | POA: Diagnosis present

## 2016-07-05 DIAGNOSIS — R079 Chest pain, unspecified: Secondary | ICD-10-CM | POA: Diagnosis present

## 2016-07-05 DIAGNOSIS — F209 Schizophrenia, unspecified: Secondary | ICD-10-CM | POA: Diagnosis present

## 2016-07-05 DIAGNOSIS — J45909 Unspecified asthma, uncomplicated: Secondary | ICD-10-CM | POA: Diagnosis present

## 2016-07-05 DIAGNOSIS — E785 Hyperlipidemia, unspecified: Secondary | ICD-10-CM | POA: Diagnosis present

## 2016-07-05 DIAGNOSIS — K219 Gastro-esophageal reflux disease without esophagitis: Secondary | ICD-10-CM | POA: Diagnosis present

## 2016-07-05 DIAGNOSIS — Z801 Family history of malignant neoplasm of trachea, bronchus and lung: Secondary | ICD-10-CM

## 2016-07-05 DIAGNOSIS — I10 Essential (primary) hypertension: Secondary | ICD-10-CM | POA: Diagnosis present

## 2016-07-05 DIAGNOSIS — R0789 Other chest pain: Principal | ICD-10-CM | POA: Diagnosis present

## 2016-07-05 DIAGNOSIS — R748 Abnormal levels of other serum enzymes: Secondary | ICD-10-CM | POA: Diagnosis present

## 2016-07-05 HISTORY — DX: Unspecified osteoarthritis, unspecified site: M19.90

## 2016-07-05 HISTORY — DX: Essential (primary) hypertension: I10

## 2016-07-05 HISTORY — DX: Anxiety disorder, unspecified: F41.9

## 2016-07-05 HISTORY — PX: CARDIAC CATHETERIZATION: SHX172

## 2016-07-05 HISTORY — DX: Schizophrenia, unspecified: F20.9

## 2016-07-05 HISTORY — DX: Major depressive disorder, single episode, unspecified: F32.9

## 2016-07-05 HISTORY — DX: Gastric ulcer, unspecified as acute or chronic, without hemorrhage or perforation: K25.9

## 2016-07-05 HISTORY — DX: Depression, unspecified: F32.A

## 2016-07-05 LAB — BASIC METABOLIC PANEL
Anion gap: 12 (ref 5–15)
BUN: 14 mg/dL (ref 6–20)
CHLORIDE: 111 mmol/L (ref 101–111)
CO2: 18 mmol/L — ABNORMAL LOW (ref 22–32)
Calcium: 9.8 mg/dL (ref 8.9–10.3)
Creatinine, Ser: 0.92 mg/dL (ref 0.44–1.00)
GFR calc Af Amer: >60 mL/min (ref >60)
GLUCOSE: 82 mg/dL (ref 65–99)
Potassium: 3.6 mmol/L (ref 3.5–5.1)
SODIUM: 141 mmol/L (ref 135–145)

## 2016-07-05 LAB — CBC
HEMATOCRIT: 41 % (ref 36.0–46.0)
Hemoglobin: 13.1 g/dL (ref 12.0–15.0)
MCH: 29 pg (ref 26.0–34.0)
MCHC: 32 g/dL (ref 30.0–36.0)
MCV: 90.7 fL (ref 78.0–100.0)
PLATELETS: 264 10*3/uL (ref 150–400)
RBC: 4.52 MIL/uL (ref 3.87–5.11)
RDW: 15.5 % (ref 11.5–15.5)
WBC: 7.6 10*3/uL (ref 4.0–10.5)

## 2016-07-05 LAB — LITHIUM LEVEL: LITHIUM LVL: 0.39 mmol/L — AB (ref 0.60–1.20)

## 2016-07-05 LAB — LIPID PANEL
CHOLESTEROL: 217 mg/dL — AB (ref 0–200)
HDL: 59 mg/dL (ref >40)
LDL CALC: 137 mg/dL — AB (ref 0–99)
TRIGLYCERIDES: 107 mg/dL (ref <150)
Total CHOL/HDL Ratio: 3.7 RATIO
VLDL: 21 mg/dL (ref 0–40)

## 2016-07-05 LAB — D-DIMER, QUANTITATIVE (NOT AT ARMC): D DIMER QUANT: 0.41 ug{FEU}/mL (ref 0.00–0.50)

## 2016-07-05 LAB — I-STAT TROPONIN, ED
Troponin i, poc: 0 ng/mL (ref 0.00–0.08)
Troponin i, poc: 0.1 ng/mL (ref 0.00–0.08)
Troponin i, poc: 1.01 ng/mL (ref 0.00–0.08)

## 2016-07-05 LAB — TROPONIN I
TROPONIN I: 0.73 ng/mL — AB (ref <0.03)
Troponin I: 1.02 ng/mL (ref <0.03)

## 2016-07-05 IMAGING — CR DG CHEST 2V
2 series · 2 of 2 positions shown · non-contrast
Comparison: [DATE]

CLINICAL DATA: Sharp right-sided chest pain for 1 day

EXAM:
CHEST  2 VIEW

[chest lat]
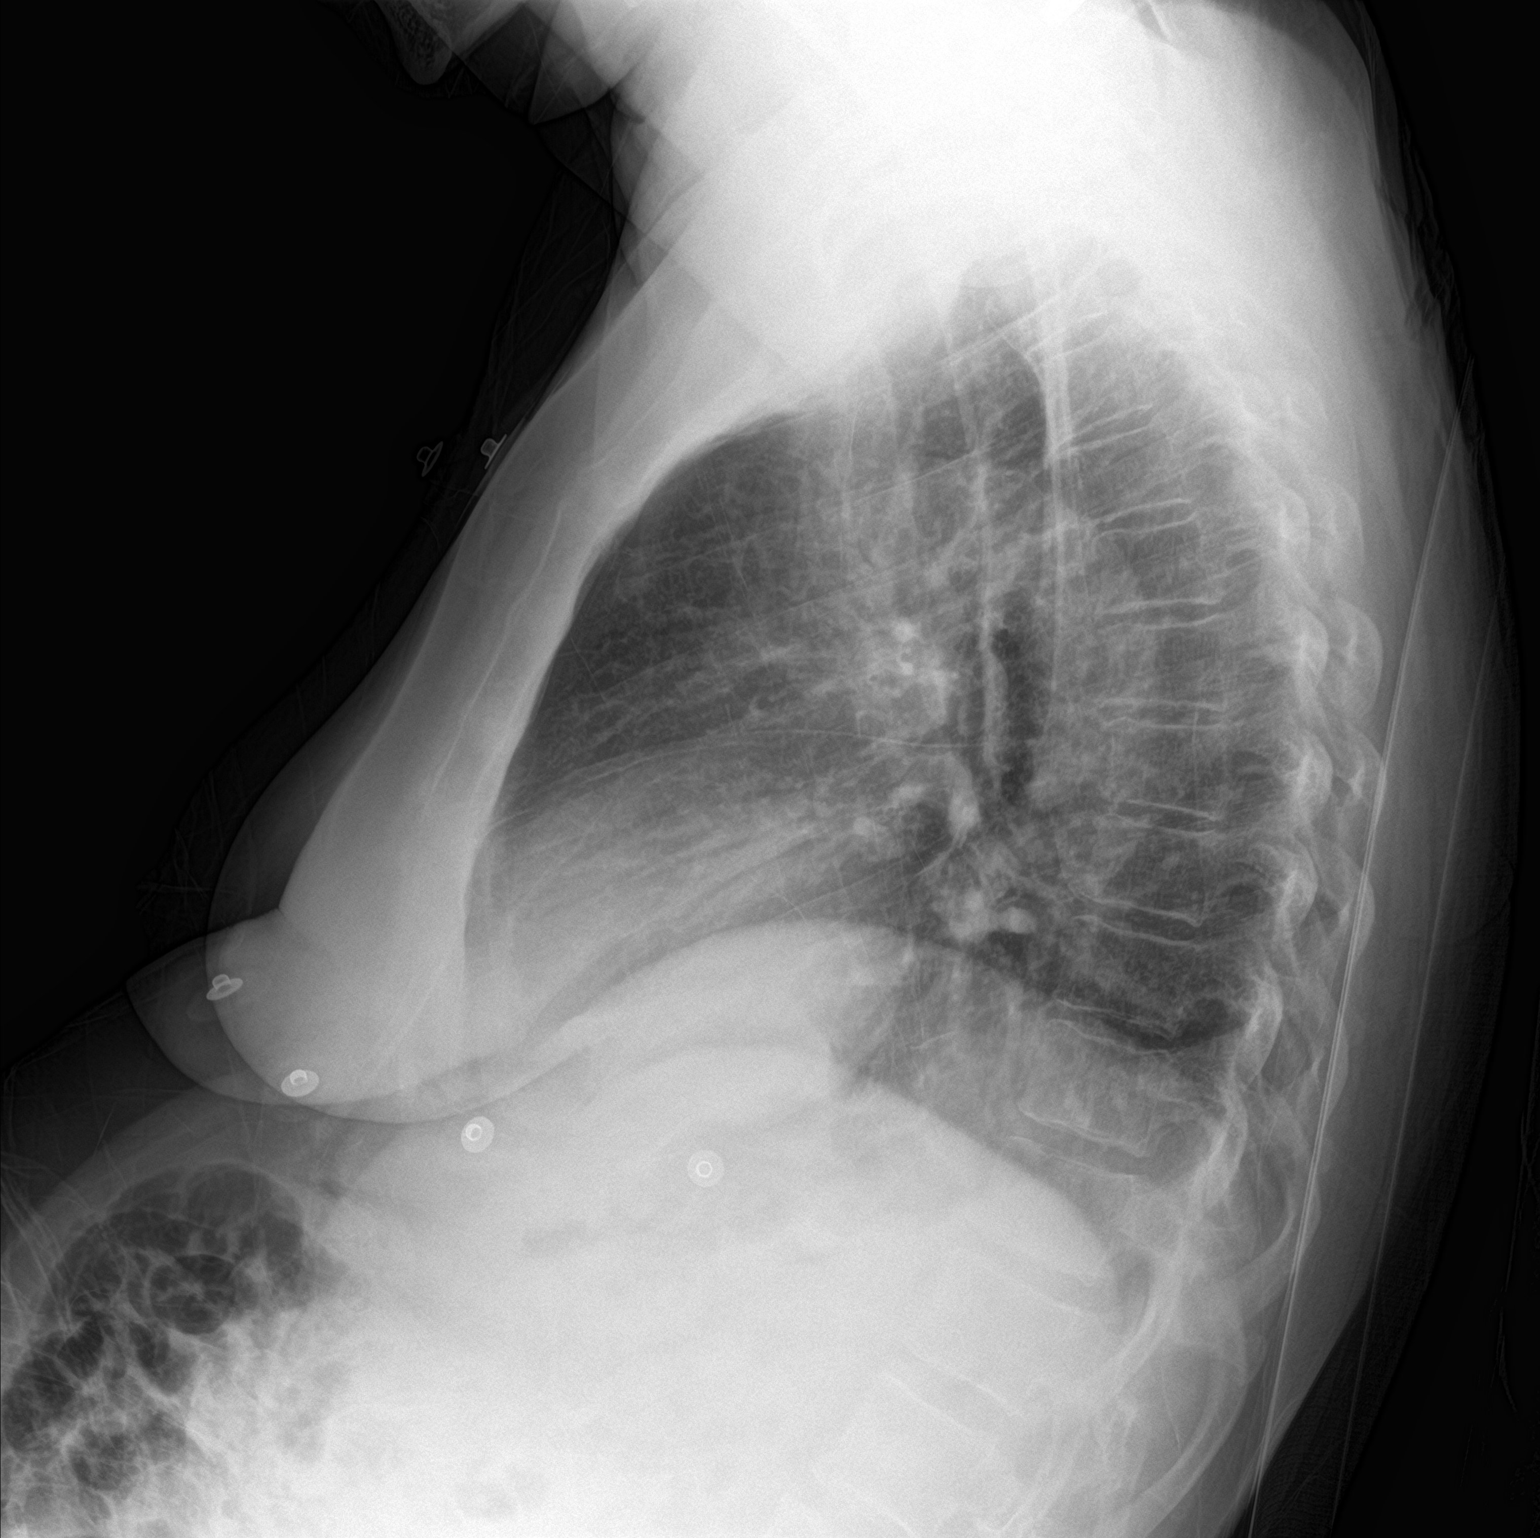

[chest ap]
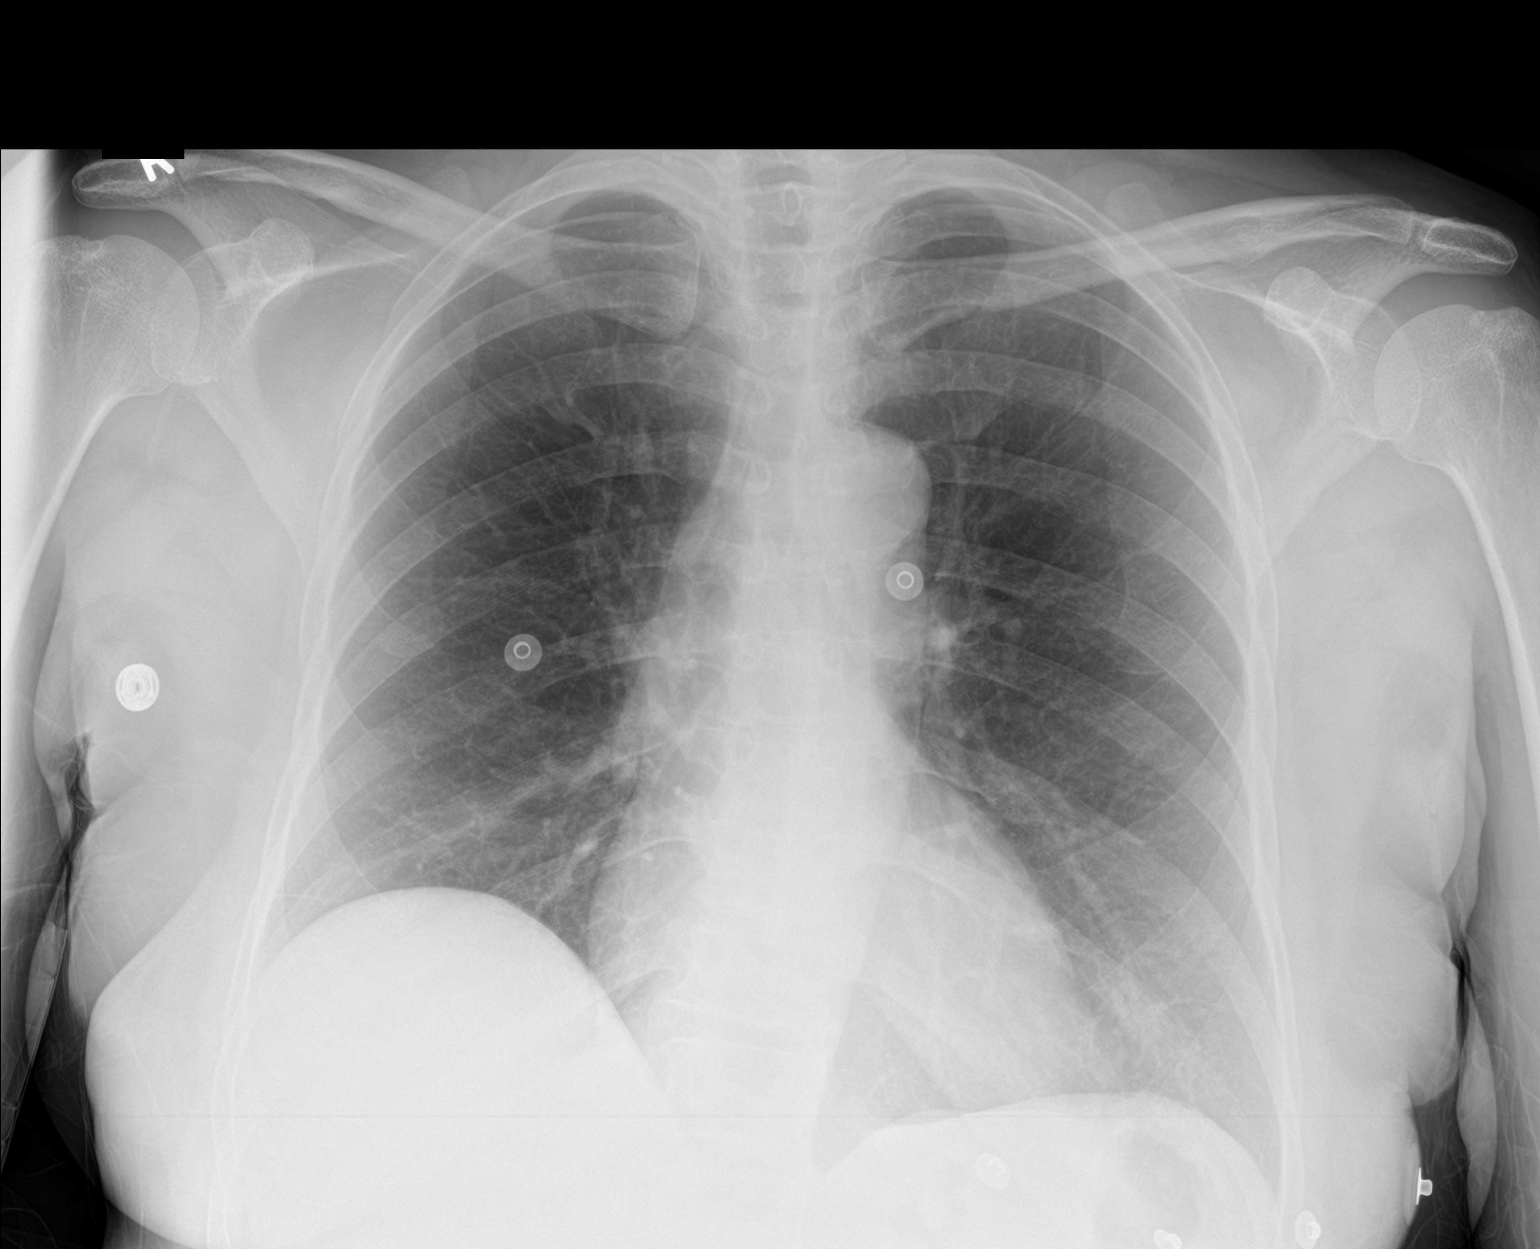

[2 of 2 positions shown; findings below may reference images not displayed]

FINDINGS: The heart size and mediastinal contours are within normal limits.
Both lungs are clear. The visualized skeletal structures are
unremarkable.
IMPRESSION: No active cardiopulmonary disease.

## 2016-07-05 SURGERY — LEFT HEART CATH AND CORONARY ANGIOGRAPHY
Anesthesia: LOCAL

## 2016-07-05 MED ORDER — MORPHINE SULFATE (PF) 4 MG/ML IV SOLN
4.0000 mg | Freq: Once | INTRAVENOUS | Status: AC
  Administered 2016-07-05: 4 mg via INTRAVENOUS
  Filled 2016-07-05: qty 1

## 2016-07-05 MED ORDER — ALPRAZOLAM 0.25 MG PO TABS: 0.2500 mg | ORAL_TABLET | Freq: Two times a day (BID) | ORAL | Status: DC | PRN

## 2016-07-05 MED ORDER — TRAZODONE HCL 100 MG PO TABS
200.0000 mg | ORAL_TABLET | Freq: Every day | ORAL | Status: DC
  Administered 2016-07-05: 200 mg via ORAL
  Filled 2016-07-05: qty 2

## 2016-07-05 MED ORDER — METHOCARBAMOL 500 MG PO TABS
500.0000 mg | ORAL_TABLET | Freq: Two times a day (BID) | ORAL | Status: DC
  Administered 2016-07-05 – 2016-07-07 (×4): 500 mg via ORAL
  Filled 2016-07-05 (×4): qty 1

## 2016-07-05 MED ORDER — VERAPAMIL HCL 2.5 MG/ML IV SOLN
INTRA_ARTERIAL | Status: DC | PRN
  Administered 2016-07-05: 15 mL via INTRA_ARTERIAL

## 2016-07-05 MED ORDER — IOPAMIDOL (ISOVUE-370) INJECTION 76%
INTRAVENOUS | Status: AC
  Filled 2016-07-05: qty 125

## 2016-07-05 MED ORDER — DIPHENHYDRAMINE HCL 25 MG PO TABS
25.0000 mg | ORAL_TABLET | Freq: Three times a day (TID) | ORAL | Status: DC | PRN
  Filled 2016-07-05: qty 1

## 2016-07-05 MED ORDER — SODIUM CHLORIDE 0.9 % IV SOLN: 250.0000 mL | INTRAVENOUS | Status: DC | PRN

## 2016-07-05 MED ORDER — FERROUS SULFATE 325 (65 FE) MG PO TABS
325.0000 mg | ORAL_TABLET | Freq: Every day | ORAL | Status: DC
  Administered 2016-07-05 – 2016-07-07 (×3): 325 mg via ORAL
  Filled 2016-07-05 (×3): qty 1

## 2016-07-05 MED ORDER — ONDANSETRON HCL 4 MG/2ML IJ SOLN
4.0000 mg | Freq: Once | INTRAMUSCULAR | Status: AC
  Administered 2016-07-05: 4 mg via INTRAVENOUS
  Filled 2016-07-05: qty 2

## 2016-07-05 MED ORDER — PANTOPRAZOLE SODIUM 40 MG PO TBEC
40.0000 mg | DELAYED_RELEASE_TABLET | Freq: Every day | ORAL | Status: DC
  Administered 2016-07-05 – 2016-07-07 (×3): 40 mg via ORAL
  Filled 2016-07-05 (×3): qty 1

## 2016-07-05 MED ORDER — NITROGLYCERIN 0.4 MG SL SUBL: 0.4000 mg | SUBLINGUAL_TABLET | SUBLINGUAL | Status: DC | PRN

## 2016-07-05 MED ORDER — LORAZEPAM 2 MG/ML IJ SOLN
1.0000 mg | Freq: Once | INTRAMUSCULAR | Status: AC
  Administered 2016-07-05: 1 mg via INTRAVENOUS
  Filled 2016-07-05: qty 1

## 2016-07-05 MED ORDER — SODIUM CHLORIDE 0.9% FLUSH
3.0000 mL | Freq: Two times a day (BID) | INTRAVENOUS | Status: DC
  Administered 2016-07-06 – 2016-07-07 (×3): 3 mL via INTRAVENOUS

## 2016-07-05 MED ORDER — CLOPIDOGREL BISULFATE 75 MG PO TABS
75.0000 mg | ORAL_TABLET | Freq: Every day | ORAL | Status: DC
  Administered 2016-07-06 – 2016-07-07 (×2): 75 mg via ORAL
  Filled 2016-07-05 (×2): qty 1

## 2016-07-05 MED ORDER — LITHIUM CARBONATE 300 MG PO CAPS
300.0000 mg | ORAL_CAPSULE | Freq: Three times a day (TID) | ORAL | Status: DC
  Administered 2016-07-05 – 2016-07-07 (×5): 300 mg via ORAL
  Filled 2016-07-05 (×6): qty 1

## 2016-07-05 MED ORDER — VERAPAMIL HCL 2.5 MG/ML IV SOLN
INTRAVENOUS | Status: AC
  Filled 2016-07-05: qty 2

## 2016-07-05 MED ORDER — HEPARIN (PORCINE) IN NACL 100-0.45 UNIT/ML-% IJ SOLN
1000.0000 [IU]/h | INTRAMUSCULAR | Status: DC
  Filled 2016-07-05: qty 250

## 2016-07-05 MED ORDER — NITROGLYCERIN IN D5W 200-5 MCG/ML-% IV SOLN: 2.0000 ug/min | INTRAVENOUS | Status: DC

## 2016-07-05 MED ORDER — ONDANSETRON HCL 4 MG/2ML IJ SOLN
4.0000 mg | Freq: Four times a day (QID) | INTRAMUSCULAR | Status: DC | PRN
  Administered 2016-07-05 – 2016-07-06 (×2): 4 mg via INTRAVENOUS
  Filled 2016-07-05 (×2): qty 2

## 2016-07-05 MED ORDER — FENTANYL CITRATE (PF) 100 MCG/2ML IJ SOLN
25.0000 ug | Freq: Once | INTRAMUSCULAR | Status: AC
  Administered 2016-07-05: 25 ug via INTRAVENOUS
  Filled 2016-07-05: qty 2

## 2016-07-05 MED ORDER — ACETAMINOPHEN 325 MG PO TABS
650.0000 mg | ORAL_TABLET | Freq: Once | ORAL | Status: AC
  Administered 2016-07-05: 650 mg via ORAL
  Filled 2016-07-05: qty 2

## 2016-07-05 MED ORDER — ASPIRIN EC 81 MG PO TBEC: 81.0000 mg | DELAYED_RELEASE_TABLET | Freq: Every day | ORAL | Status: DC

## 2016-07-05 MED ORDER — SODIUM CHLORIDE 0.9% FLUSH: 3.0000 mL | INTRAVENOUS | Status: DC | PRN

## 2016-07-05 MED ORDER — DEXTROSE 5 % AND 0.45 % NACL IV BOLUS
500.0000 mL | Freq: Once | INTRAVENOUS | Status: AC
Start: 2016-07-05 — End: 2016-07-05
  Administered 2016-07-05: 500 mL via INTRAVENOUS

## 2016-07-05 MED ORDER — METOPROLOL TARTRATE 12.5 MG HALF TABLET
12.5000 mg | ORAL_TABLET | Freq: Two times a day (BID) | ORAL | Status: DC
  Administered 2016-07-05 – 2016-07-07 (×4): 12.5 mg via ORAL
  Filled 2016-07-05 (×4): qty 1

## 2016-07-05 MED ORDER — SODIUM CHLORIDE 0.9% FLUSH
3.0000 mL | Freq: Two times a day (BID) | INTRAVENOUS | Status: DC
  Administered 2016-07-06 (×2): 3 mL via INTRAVENOUS

## 2016-07-05 MED ORDER — FLUTICASONE PROPIONATE 50 MCG/ACT NA SUSP
1.0000 | Freq: Every day | NASAL | Status: DC
  Administered 2016-07-06: 1 via NASAL
  Filled 2016-07-05: qty 16

## 2016-07-05 MED ORDER — SODIUM CHLORIDE 0.9 % IV SOLN
INTRAVENOUS | Status: DC | PRN
  Administered 2016-07-05: 100 mL/h via INTRAVENOUS

## 2016-07-05 MED ORDER — LIDOCAINE HCL (PF) 1 % IJ SOLN
INTRAMUSCULAR | Status: AC
  Filled 2016-07-05: qty 30

## 2016-07-05 MED ORDER — SODIUM CHLORIDE 0.9 % WEIGHT BASED INFUSION: 3.0000 mL/kg/h | INTRAVENOUS | Status: AC

## 2016-07-05 MED ORDER — HEPARIN (PORCINE) IN NACL 2-0.9 UNIT/ML-% IJ SOLN
INTRAMUSCULAR | Status: AC
  Filled 2016-07-05: qty 500

## 2016-07-05 MED ORDER — ATORVASTATIN CALCIUM 80 MG PO TABS
80.0000 mg | ORAL_TABLET | Freq: Every day | ORAL | Status: DC
  Administered 2016-07-06: 80 mg via ORAL

## 2016-07-05 MED ORDER — HEPARIN SODIUM (PORCINE) 1000 UNIT/ML IJ SOLN
INTRAMUSCULAR | Status: AC
  Filled 2016-07-05: qty 1

## 2016-07-05 MED ORDER — IOPAMIDOL (ISOVUE-370) INJECTION 76%
INTRAVENOUS | Status: DC | PRN
  Administered 2016-07-05: 75 mL via INTRA_ARTERIAL

## 2016-07-05 MED ORDER — FENTANYL CITRATE (PF) 100 MCG/2ML IJ SOLN
50.0000 ug | Freq: Four times a day (QID) | INTRAMUSCULAR | Status: DC | PRN
Start: 2016-07-05 — End: 2016-07-07
  Administered 2016-07-05: 50 ug via INTRAVENOUS
  Filled 2016-07-05 (×2): qty 2

## 2016-07-05 MED ORDER — HYDROXYZINE HCL 25 MG PO TABS: 50.0000 mg | ORAL_TABLET | Freq: Two times a day (BID) | ORAL | Status: DC | PRN

## 2016-07-05 MED ORDER — BUPROPION HCL 75 MG PO TABS
75.0000 mg | ORAL_TABLET | Freq: Two times a day (BID) | ORAL | Status: DC
  Administered 2016-07-06 – 2016-07-07 (×3): 75 mg via ORAL
  Filled 2016-07-05 (×4): qty 1

## 2016-07-05 MED ORDER — HEPARIN BOLUS VIA INFUSION
4000.0000 [IU] | Freq: Once | INTRAVENOUS | Status: DC
  Filled 2016-07-05: qty 4000

## 2016-07-05 MED ORDER — LIDOCAINE HCL (PF) 1 % IJ SOLN
INTRAMUSCULAR | Status: DC | PRN
  Administered 2016-07-05: 2 mL via SUBCUTANEOUS

## 2016-07-05 MED ORDER — ALBUTEROL SULFATE (2.5 MG/3ML) 0.083% IN NEBU: 2.5000 mg | INHALATION_SOLUTION | Freq: Four times a day (QID) | RESPIRATORY_TRACT | Status: DC | PRN

## 2016-07-05 MED ORDER — HEPARIN (PORCINE) IN NACL 2-0.9 UNIT/ML-% IJ SOLN
INTRAMUSCULAR | Status: DC | PRN
  Administered 2016-07-05: 1000 mL

## 2016-07-05 MED ORDER — HEPARIN SODIUM (PORCINE) 1000 UNIT/ML IJ SOLN
INTRAMUSCULAR | Status: DC | PRN
  Administered 2016-07-05: 4500 [IU] via INTRAVENOUS

## 2016-07-05 MED ORDER — ACETAMINOPHEN 325 MG PO TABS
650.0000 mg | ORAL_TABLET | ORAL | Status: DC | PRN
Start: 2016-07-05 — End: 2016-07-07

## 2016-07-05 SURGICAL SUPPLY — 9 items
CATH OPTITORQUE TIG 4.0 5F (CATHETERS) ×2 IMPLANT
DEVICE RAD COMP TR BAND LRG (VASCULAR PRODUCTS) ×2 IMPLANT
ELECT DEFIB PAD ADLT CADENCE (PAD) ×2 IMPLANT
GLIDESHEATH SLEND A-KIT 6F 20G (SHEATH) ×2 IMPLANT
KIT HEART LEFT (KITS) ×2 IMPLANT
PACK CARDIAC CATHETERIZATION (CUSTOM PROCEDURE TRAY) ×2 IMPLANT
TRANSDUCER W/STOPCOCK (MISCELLANEOUS) ×2 IMPLANT
TUBING CIL FLEX 10 FLL-RA (TUBING) ×2 IMPLANT
WIRE SAFE-T 1.5MM-J .035X260CM (WIRE) ×2 IMPLANT

## 2016-07-05 NOTE — ED Notes (Signed)
Lawerance Bach, PA, made aware of pt's pain 10/10.

## 2016-07-05 NOTE — ED Notes (Signed)
Dr. Einar Gip made aware of trop 0.73.

## 2016-07-05 NOTE — ED Triage Notes (Addendum)
Pt from home via [facility] for R sided chest pain that radiates to R arm, starting at 0915.  [facility] EKG showed nsr.  Initial bp 786 systolic, given 1 nitro that dropped pressure to 120/72 and pt had headache.  Pt states she was dizzy and lightheaded when she woke up this am to use the bathroom.

## 2016-07-05 NOTE — ED Notes (Signed)
Dr Einar Gip made aware of pt's pain 10/10, verbal order for 55m morphine IV STAT received.

## 2016-07-05 NOTE — ED Notes (Signed)
Cards at bedside

## 2016-07-05 NOTE — Progress Notes (Signed)
   07/05/16 2021  Vitals  Temp 98.6 F (37 C)  Temp Source Oral  BP (!) 126/95  BP Location Left Arm  BP Method Automatic  Patient Position (if appropriate) Lying  Pulse Rate 76  Pulse Rate Source Dinamap  Resp 14  Oxygen Therapy  SpO2 100 %  O2 Device Room Air  Pain Assessment  Pain Assessment 0-10  Pain Score 10  Pain Type Acute pain;Surgical pain  Pain Location Hand  Pain Descriptors / Indicators Tightness  Pain Frequency Constant  Pain Onset On-going   Pt transferred from cath lab. VSS. Pt oriented to room. Call bell in place.
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
## 2016-07-05 NOTE — ED Provider Notes (Signed)
[facility] DEPT Provider Note   CSN: [id number] Arrival date & time: 07/05/16  1013     History   Chief Complaint Chief Complaint  Patient presents with  . Chest Pain    HPI Denise Knight is a 50 y.o. female.  The history is provided by the patient and medical records.  Chest Pain    50 year old female with history of asthma, bipolar disorder, GERD, PTSD, schizophrenia, presenting to the ED for chest pain. Patient reports she woke up this morning around 0915 with some right-sided chest pain which has been constant since onset. States this feels like a "contraction" in her right breast.  States hx of same in the past but that it would go away with a few deep breaths.  Nothing seems to make the pain better or worse. Denies any associated shortness of breath, diaphoresis, numbness, or weakness.  States pain will intermittently "shoot down the whole right side of her body into her right leg".  Denies and numbness or weakness of her arms or legs.  No recent illness, fever, or chills. No sick contacts.  No known cardiac hx.  States mom had defibrillator and dad had some "heart issues" but not sure what their diagnoses were.  She states she is an occasional smoker. No history of DVT or PE. No recent travel, prolonged immobilization, surgeries, or exogenous estrogens.  VSS.  Past Medical History:  Diagnosis Date  . Asthma   . Bipolar 1 disorder ([place])   . Celiac disease   . GERD (gastroesophageal reflux disease)   . Post traumatic stress disorder   . Schizophrenia ([place])     There are no active problems to display for this patient.   Past Surgical History:  Procedure Laterality Date  . FOOT SURGERY    . TUBAL LIGATION    . WRIST SURGERY      OB History    No data available       Home Medications    Prior to Admission medications   Medication Sig Start Date End Date Taking? Authorizing Provider  albuterol (PROVENTIL HFA;VENTOLIN HFA) 108 (90 BASE) MCG/ACT inhaler  Inhale 2 puffs into the lungs every 6 (six) hours as needed for wheezing or shortness of breath.    Historical Provider, MD  buPROPion (WELLBUTRIN) 75 MG tablet Take 75 mg by mouth 2 (two) times daily.    Historical Provider, MD  diphenhydrAMINE (BENADRYL) 25 MG tablet Take 1 tablet (25 mg total) by mouth every 8 (eight) hours as needed for itching. 02/27/15   [name], PA-C  ferrous sulfate 325 (65 FE) MG tablet Take 325 mg by mouth daily.    Historical Provider, MD  hydrochlorothiazide (HYDRODIURIL) 12.5 MG tablet Take 12.5 mg by mouth daily.    Historical Provider, MD  hydrOXYzine (ATARAX/VISTARIL) 50 MG tablet Take 50 mg by mouth 2 (two) times daily as needed for anxiety.     Historical Provider, MD  lithium carbonate 300 MG capsule Take 300 mg by mouth 3 (three) times daily.     Historical Provider, MD  methocarbamol (ROBAXIN) 500 MG tablet Take 1 tablet (500 mg total) by mouth 2 (two) times daily. 02/03/16   Loretto Lions, PA-C  mometasone (NASONEX) 50 MCG/ACT nasal spray Place 1 spray into both nostrils daily.     Historical Provider, MD  naproxen (NAPROSYN) 500 MG tablet Take 1 tablet (500 mg total) by mouth 2 (two) times daily. 03/03/16   Stevi Barrett, PA-C  omeprazole (PRILOSEC) 40 MG  capsule Take 40 mg by mouth 2 (two) times daily.     Historical Provider, MD  traZODone (DESYREL) 150 MG tablet Take 200 mg by mouth at bedtime.    Historical Provider, MD    Family History Family History  Problem Relation Age of Onset  . Breast cancer    . Lung cancer    . Congestive Heart Failure    . Diabetes    . Hypertension      Social History Social History  Substance Use Topics  . Smoking status: Current Every Day Smoker    Packs/day: 0.25    Types: Cigarettes  . Smokeless tobacco: Never Used  . Alcohol use Yes     Comment: occassional heavy drinker     Allergies   Aspirin and Food   Review of Systems Review of Systems  Cardiovascular: Positive for chest pain.    All other systems reviewed and are negative.    Physical Exam Updated Vital Signs BP 124/91   Pulse 97   Temp 98 F (36.7 C) (Oral)   Resp 22   Ht 5' 8.5" (1.74 m)   Wt 84.4 kg   SpO2 99%   BMI 27.87 kg/m   Physical Exam  Constitutional: She is oriented to person, place, and time. She appears well-developed and well-nourished.  HENT:  Head: Normocephalic and atraumatic.  Mouth/Throat: Oropharynx is clear and moist.  Eyes: Conjunctivae and EOM are normal. Pupils are equal, round, and reactive to light.  Neck: Normal range of motion.  Cardiovascular: Normal rate, regular rhythm and normal heart sounds.   Pulmonary/Chest: Effort normal and breath sounds normal.  Lungs sounds clear, normal effort, no distress, speaking in full sentences without difficulty  Abdominal: Soft. Bowel sounds are normal.  Musculoskeletal: Normal range of motion.  No pitting edema No calf asymmetry, tenderness, or palpable cords, no overlying erythema or skin discoloration, DP pulses intact bilaterally  Neurological: She is alert and oriented to person, place, and time.  Skin: Skin is warm and dry.  Psychiatric: Her mood appears anxious.  Anxious and tearful, hyperventilating towards end of exam  Nursing note and vitals reviewed.    ED Treatments / Results  Labs (all labs ordered are listed, but only abnormal results are displayed) Labs Reviewed  BASIC METABOLIC PANEL - Abnormal; Notable for the following:       Result Value   CO2 18 (*)    All other components within normal limits  CBC  I-STAT TROPOININ, ED    EKG  EKG Interpretation  Date/Time:  Friday July 05 2016 10:18:12 EDT Ventricular Rate:  88 PR Interval:    QRS Duration: 75 QT Interval:  418 QTC Calculation: 506 R Axis:   66 Text Interpretation:  Sinus rhythm ST elev, probable normal early repol pattern Borderline prolonged QT interval No significant change since last tracing Confirmed by Winfred Leeds  MD, SAM [phone]) on  07/05/2016 10:21:55 AM       Radiology Dg Chest 2 View  Result Date: 07/05/2016 CLINICAL DATA:  Hervey Ard right-sided chest pain for 1 day EXAM: CHEST  2 VIEW COMPARISON:  10/20/2015 FINDINGS: The heart size and mediastinal contours are within normal limits. Both lungs are clear. The visualized skeletal structures are unremarkable. IMPRESSION: No active cardiopulmonary disease. Electronically Signed   By: Inez Catalina M.D.   On: 07/05/2016 11:14    Procedures Procedures (including critical care time)  CRITICAL CARE Performed by: Larene Pickett   Total critical care time: 45 minutes  Critical care time was exclusive of separately billable procedures and treating other patients.  Critical care was necessary to treat or prevent imminent or life-threatening deterioration.  Critical care was time spent personally by me on the following activities: development of treatment plan with patient and/or surrogate as well as nursing, discussions with consultants, evaluation of patient's response to treatment, examination of patient, obtaining history from patient or surrogate, ordering and performing treatments and interventions, ordering and review of laboratory studies, ordering and review of radiographic studies, pulse oximetry and re-evaluation of patient's condition.   Medications Ordered in ED Medications  acetaminophen (TYLENOL) tablet 650 mg (650 mg Oral Given 07/05/16 1118)  ondansetron (ZOFRAN) injection 4 mg (4 mg Intravenous Given 07/05/16 1118)  LORazepam (ATIVAN) injection 1 mg (1 mg Intravenous Given 07/05/16 1225)  morphine 4 MG/ML injection 4 mg (4 mg Intravenous Given 07/05/16 1455)     Initial Impression / Assessment and Plan / ED Course  I have reviewed the triage vital signs and the nursing notes.  Pertinent labs & imaging results that were available during my care of the patient were reviewed by me and considered in my medical decision making (see chart for  details).  Clinical Course   50 year old female here with right-sided chest pain onset this morning around 9 AM. She is afebrile and nontoxic. She does appear mildly anxious and began hyperventilating during my exam. Once calmed she has normal lung sounds and is in normal sinus rhythm. Her EKG is nonischemic. Initial labs including d-dimer are reassuring. Chest x-ray is clear. She's not had any response to nitroglycerin here, states it just gave her a headache. Will continue to monitor and obtain delta troponin.  2:26 PM Patient's delta troponin is elevated at 0.1.  Continues to have some pressure.  No relief from NTG previously.  Will give dose of morphine.  She reports Anaphylactic allergy to aspirin. Will consult cardiology for admission.  Spoke with Dr. Nadyne Coombes-- he is about to perform cardiac catherization.  Recommends lab troponin, heparin drip.  He will be down to see patient after this case.  Aware of patient's ASA allergy.  Final Clinical Impressions(s) / ED Diagnoses   Final diagnoses:  Chest pain, unspecified chest pain type    New Prescriptions New Prescriptions   No medications on file     Larene Pickett, Hershal Coria 07/05/16 [place], MD 07/05/16 1534

## 2016-07-05 NOTE — H&P (Signed)
Denise Knight is an 50 y.o. female.   Chief Complaint: Chest pain HPI: Denise Knight  is a 50 y.o. female with a history of asthma, bipolar disorder, GERD, PTSD, and schizophrenia, presenting to the ED for chest pain. She woke this morning around 0915 with sharp right sided chest pain. Symptoms occur intermittently lasting just a few minutes at a time with associated shortness of breath. Sitting up and deep breathing seems to make the pain worse. Has history of HTN and occasional tobacco use, no DM, HLD, or thyroid disease. D-dimer negative, initial troponin 0, repeat troponin 0.01. Reports persistent dry cough over the past several weeks.   Past Medical History:  Diagnosis Date  . Asthma   . Bipolar 1 disorder ([place])   . Celiac disease   . GERD (gastroesophageal reflux disease)   . Post traumatic stress disorder   . Schizophrenia [facility])     Past Surgical History:  Procedure Laterality Date  . FOOT SURGERY    . TUBAL LIGATION    . WRIST SURGERY      Family History  Problem Relation Age of Onset  . Breast cancer    . Lung cancer    . Congestive Heart Failure    . Diabetes    . Hypertension     Social History:  reports that she has been smoking Cigarettes.  She has been smoking about 0.25 packs per day. She has never used smokeless tobacco. She reports that she drinks alcohol. She reports that she does not use drugs.  Allergies:  Allergies  Allergen Reactions  . Aspirin Hives  . Food Hives    "Regular butter"    Review of Systems - History obtained from the patient Hematological and Lymphatic ROS: negative for - bleeding problems, blood clots, fatigue, night sweats or weight loss Respiratory ROS: positive for - shortness of breath with chest pain negative for - cough, hemoptysis or orthopnea Cardiovascular ROS: positive for - chest pain negative for - dyspnea on exertion, edema, palpitations or paroxysmal nocturnal dyspnea Musculoskeletal ROS: positive for - chest wall  tenderness negative for - joint pain or joint swelling Neurological ROS: no TIA or stroke symptoms  Blood pressure 111/70, pulse 86, temperature 98 F (36.7 C), temperature source Oral, resp. rate 21, height 5' 8.5" (1.74 m), weight 84.4 kg (186 lb), SpO2 100 %. General appearance: alert, cooperative, appears stated age and mild distress Eyes: negative Neck: no adenopathy, no carotid bruit, no JVD, supple, symmetrical, trachea midline and thyroid not enlarged, symmetric, no tenderness/mass/nodules Resp: clear to auscultation bilaterally Chest wall: right sided chest wall tenderness Cardio: regular rate and rhythm, S1, S2 normal, no murmur, click, rub or gallop GI: soft, non-tender; bowel sounds normal; no masses,  no organomegaly Extremities: extremities normal, atraumatic, no cyanosis or edema Pulses: 2+ and symmetric Skin: Skin color, texture, turgor normal. No rashes or lesions Neurologic: Grossly normal  Results for orders placed or performed during the hospital encounter of 07/05/16 (from the past 48 hour(s))  D-dimer, quantitative (not at [facility])     Status: None   Collection Time: 07/05/16 10:29 AM  Result Value Ref Range   D-Dimer, Quant 0.41 0.00 - 0.50 ug/mL-FEU    Comment: (NOTE) At the manufacturer cut-off of 0.50 ug/mL FEU, this assay has been documented to exclude Knight with a sensitivity and negative predictive value of 97 to 99%.  At this time, this assay has not been approved by the FDA to exclude DVT/VTE. Results should be correlated with  clinical presentation.   Basic metabolic panel     Status: Abnormal   Collection Time: 07/05/16 10:34 AM  Result Value Ref Range   Sodium 141 135 - 145 mmol/L   Potassium 3.6 3.5 - 5.1 mmol/L   Chloride 111 101 - 111 mmol/L   CO2 18 (L) 22 - 32 mmol/L   Glucose, Bld 82 65 - 99 mg/dL   BUN 14 6 - 20 mg/dL   Creatinine, Ser 0.92 0.44 - 1.00 mg/dL   Calcium 9.8 8.9 - 10.3 mg/dL   GFR calc non Af Amer >60 >60 mL/min   GFR calc Af  Amer >60 >60 mL/min    Comment: (NOTE) The eGFR has been calculated using the CKD EPI equation. This calculation has not been validated in all clinical situations. eGFR's persistently <60 mL/min signify possible Chronic Kidney Disease.    Anion gap 12 5 - 15  CBC     Status: None   Collection Time: 07/05/16 10:34 AM  Result Value Ref Range   WBC 7.6 4.0 - 10.5 K/uL   RBC 4.52 3.87 - 5.11 MIL/uL   Hemoglobin 13.1 12.0 - 15.0 g/dL   HCT 41.0 36.0 - 46.0 %   MCV 90.7 78.0 - 100.0 fL   MCH 29.0 26.0 - 34.0 pg   MCHC 32.0 30.0 - 36.0 g/dL   RDW 15.5 11.5 - 15.5 %   Platelets 264 150 - 400 K/uL  I-stat troponin, ED     Status: None   Collection Time: 07/05/16 10:42 AM  Result Value Ref Range   Troponin i, poc 0.00 0.00 - 0.08 ng/mL   Comment 3            Comment: Due to the release kinetics of cTnI, a negative result within the first hours of the onset of symptoms does not rule out myocardial infarction with certainty. If myocardial infarction is still suspected, repeat the test at appropriate intervals.   I-stat troponin, ED     Status: Abnormal   Collection Time: 07/05/16  2:08 PM  Result Value Ref Range   Troponin i, poc 0.10 (HH) 0.00 - 0.08 ng/mL   Comment NOTIFIED PHYSICIAN    Comment 3            Comment: Due to the release kinetics of cTnI, a negative result within the first hours of the onset of symptoms does not rule out myocardial infarction with certainty. If myocardial infarction is still suspected, repeat the test at appropriate intervals.    Dg Chest 2 View  Result Date: 07/05/2016 CLINICAL DATA:  Sharp right-sided chest pain for 1 day EXAM: CHEST  2 VIEW COMPARISON:  10/20/2015 FINDINGS: The heart size and mediastinal contours are within normal limits. Both lungs are clear. The visualized skeletal structures are unremarkable. IMPRESSION: No active cardiopulmonary disease. Electronically Signed   By: Mark  Lukens M.D.   On: 07/05/2016 11:14    Labs:   Lab  Results  Component Value Date   WBC 7.6 07/05/2016   HGB 13.1 07/05/2016   HCT 41.0 07/05/2016   MCV 90.7 07/05/2016   PLT 264 07/05/2016    Recent Labs Lab 07/05/16 1034  NA 141  K 3.6  CL 111  CO2 18*  BUN 14  CREATININE 0.92  CALCIUM 9.8  GLUCOSE 82    Lipid Panel  No results found for: CHOL, TRIG, HDL, CHOLHDL, VLDL, LDLCALC  BNP (last 3 results) No results for input(s): BNP in the last 8760 hours.    HEMOGLOBIN A1C No results found for: HGBA1C, MPG  Cardiac Panel (last 3 results) No results for input(s): CKTOTAL, CKMB, TROPONINI, RELINDX in the last 8760 hours.  No results found for: CKTOTAL, CKMB, CKMBINDEX, TROPONINI   TSH No results for input(s): TSH in the last 8760 hours.  EKG 07/05/2016: Sinus rhythm at a rate of 88 bpm, normal axis, normal intervals, no evidence of ischemia. QT/QTc 418/506 ms   (Not in a hospital admission)   No current facility-administered medications for this encounter.   Current Outpatient Prescriptions:  .  albuterol (PROVENTIL HFA;VENTOLIN HFA) 108 (90 BASE) MCG/ACT inhaler, Inhale 2 puffs into the lungs every 6 (six) hours as needed for wheezing or shortness of breath., Disp: , Rfl:  .  buPROPion (WELLBUTRIN) 75 MG tablet, Take 75 mg by mouth 2 (two) times daily., Disp: , Rfl:  .  diphenhydrAMINE (BENADRYL) 25 MG tablet, Take 1 tablet (25 mg total) by mouth every 8 (eight) hours as needed for itching., Disp: 9 tablet, Rfl: 0 .  ferrous sulfate 325 (65 FE) MG tablet, Take 325 mg by mouth daily., Disp: , Rfl:  .  hydrochlorothiazide (HYDRODIURIL) 12.5 MG tablet, Take 12.5 mg by mouth daily., Disp: , Rfl:  .  hydrOXYzine (ATARAX/VISTARIL) 50 MG tablet, Take 50 mg by mouth 2 (two) times daily as needed for anxiety. , Disp: , Rfl:  .  lithium carbonate 300 MG capsule, Take 300 mg by mouth 3 (three) times daily. , Disp: , Rfl:  .  methocarbamol (ROBAXIN) 500 MG tablet, Take 1 tablet (500 mg total) by mouth 2 (two) times daily., Disp: 20  tablet, Rfl: 0 .  mometasone (NASONEX) 50 MCG/ACT nasal spray, Place 1 spray into both nostrils daily. , Disp: , Rfl:  .  naproxen (NAPROSYN) 500 MG tablet, Take 1 tablet (500 mg total) by mouth 2 (two) times daily., Disp: 30 tablet, Rfl: 0 .  omeprazole (PRILOSEC) 40 MG capsule, Take 40 mg by mouth 2 (two) times daily. , Disp: , Rfl:  .  traZODone (DESYREL) 150 MG tablet, Take 200 mg by mouth at bedtime., Disp: , Rfl:     Assessment/Plan 1. Atypical Chest Pain 2. Asthma 3. Celiac Disease 4. GERD 5. Bipolar/Schizophrenia  Recommendation: chest pain clearly consistent with musculoskeletal etiology, reproducible with movement, palpation, and deep breathing. Second troponin slightly elevated. Will repeat and admit for ACS if it continues to increase, otherwise recommend treating for costochondritis. Dr. Einar Gip to see with further recommendations.  Rachel Bo, NP-C 07/05/2016, 4:01 PM [facility]. [place] Pager: [phone] Office: [phone]

## 2016-07-05 NOTE — Progress Notes (Signed)
Bilateral upper extremity equal blood pressure, chest pain easily reproducible, chest x-ray no mediastinal enlargement.

## 2016-07-05 NOTE — Interval H&P Note (Signed)
History and Physical Interval Note:  07/05/2016 7:31 PM  Denise Knight  has presented today for surgery, with the diagnosis of chest pain  The various methods of treatment have been discussed with the patient and family. After consideration of risks, benefits and other options for treatment, the patient has consented to  Procedure(s): Left Heart Cath and Coronary Angiography (N/A) and possible angioplasty as a surgical intervention .  The patient's history has been reviewed, patient examined, no change in status, stable for surgery.  I have reviewed the patient's chart and labs.  Questions were answered to the patient's satisfaction.    Cath Lab Visit (complete for each Cath Lab visit)  Clinical Evaluation Leading to the Procedure:   ACS: Yes.    Non-ACS:    Anginal Classification: CCS IV  Anti-ischemic medical therapy: No Therapy  Non-Invasive Test Results: No non-invasive testing performed  Prior CABG: No previous CABG       Adrian Prows

## 2016-07-05 NOTE — Progress Notes (Signed)
ANTICOAGULATION CONSULT NOTE - Initial Consult  Pharmacy Consult for Heparin Indication: chest pain/ACS  Allergies  Allergen Reactions  . Aspirin Hives  . Food Hives    "Regular butter"    Patient Measurements: Height: 5' 8.5" (174 cm) Weight: 186 lb (84.4 kg) IBW/kg (Calculated) : 65.05 Heparin Dosing Weight: 82 kg  Vital Signs: Temp: 98 F (36.7 C) (08/25 1022) Temp Source: Oral (08/25 1022) BP: 107/72 (08/25 1700) Pulse Rate: 82 (08/25 1700)  Labs:  Recent Labs  07/05/16 1034 07/05/16 1611  HGB 13.1  --   HCT 41.0  --   PLT 264  --   CREATININE 0.92  --   TROPONINI  --  0.73*    Estimated Creatinine Clearance: 83.1 mL/min (by C-G formula based on SCr of 0.92 mg/dL).   Medical History: Past Medical History:  Diagnosis Date  . Asthma   . Bipolar 1 disorder ([place])   . Celiac disease   . GERD (gastroesophageal reflux disease)   . Post traumatic stress disorder   . Schizophrenia [facility])    Assessment: Patient presented to ED with chest pain. Troponin: 0.00, 0.10, 0.73,1.01. D-dimer negative. K and Mg WNL. CBC normal. Scr 0.92. No anticoagulation meds PTA.  Goal of Therapy:  Heparin level 0.3-0.7 units/ml Monitor platelets by anticoagulation protocol: Yes   Plan:  Give 4000 units bolus x 1 Start heparin infusion at 1000 units/hr Check anti-Xa level in 6 hours and daily while on heparin  Check CBC daily. Follow up cardiology plans- debating between stress test and Cath.  Curt Bears Abdulkareem Badolato 07/05/2016,6:02 PM

## 2016-07-05 NOTE — ED Notes (Signed)
Dr. Nadyne Coombes MD at bedside.

## 2016-07-06 ENCOUNTER — Inpatient Hospital Stay (HOSPITAL_COMMUNITY): Payer: Self-pay

## 2016-07-06 ENCOUNTER — Encounter (HOSPITAL_COMMUNITY): Payer: Self-pay | Admitting: Radiology

## 2016-07-06 LAB — CBC
HCT: 40.6 % (ref 36.0–46.0)
HEMOGLOBIN: 13.2 g/dL (ref 12.0–15.0)
MCH: 29.5 pg (ref 26.0–34.0)
MCHC: 32.5 g/dL (ref 30.0–36.0)
MCV: 90.6 fL (ref 78.0–100.0)
Platelets: 238 10*3/uL (ref 150–400)
RBC: 4.48 MIL/uL (ref 3.87–5.11)
RDW: 15.7 % — AB (ref 11.5–15.5)
WBC: 9.1 10*3/uL (ref 4.0–10.5)

## 2016-07-06 LAB — ECHOCARDIOGRAM COMPLETE
CHL CUP MV DEC (S): 183
CHL CUP TV REG PEAK VELOCITY: 258 cm/s
E decel time: 183 msec
EERAT: 11
FS: 29 % (ref 28–44)
Height: 68.5 in
IV/PV OW: 1.34
LA vol: 47.3 mL
LADIAMINDEX: 1.46 cm/m2
LASIZE: 29 mm
LAVOLA4C: 50.4 mL
LAVOLIN: 23.8 mL/m2
LEFT ATRIUM END SYS DIAM: 29 mm
LVEEAVG: 11
LVEEMED: 11
LVELAT: 6.31 cm/s
LVOT area: 2.84 cm2
LVOT diameter: 19 mm
MV pk E vel: 69.4 m/s
MVPKAVEL: 65.5 m/s
PW: 8.05 mm — AB (ref 0.6–1.1)
RV sys press: 30 mmHg
TDI e' lateral: 6.31
TDI e' medial: 7.83
TRMAXVEL: 258 cm/s
Weight: 2976 oz

## 2016-07-06 LAB — TROPONIN I
Troponin I: 6.51 ng/mL (ref <0.03)
Troponin I: 7.09 ng/mL (ref <0.03)
Troponin I: 4.02 ng/mL (ref <0.03)

## 2016-07-06 MED ORDER — HYDROMORPHONE HCL 1 MG/ML IJ SOLN
0.5000 mg | INTRAMUSCULAR | Status: DC | PRN
  Administered 2016-07-06 (×3): 1 mg via INTRAVENOUS
  Filled 2016-07-06 (×3): qty 1

## 2016-07-06 MED ORDER — FENTANYL CITRATE (PF) 100 MCG/2ML IJ SOLN
50.0000 ug | Freq: Once | INTRAMUSCULAR | Status: AC
  Administered 2016-07-06: 50 ug via INTRAVENOUS

## 2016-07-06 MED ORDER — ZOLPIDEM TARTRATE 5 MG PO TABS
5.0000 mg | ORAL_TABLET | Freq: Once | ORAL | Status: AC
  Administered 2016-07-06: 5 mg via ORAL
  Filled 2016-07-06: qty 1

## 2016-07-06 MED ORDER — MIDAZOLAM HCL 2 MG/2ML IJ SOLN
INTRAMUSCULAR | Status: AC
  Filled 2016-07-06: qty 2

## 2016-07-06 MED ORDER — IOPAMIDOL (ISOVUE-370) INJECTION 76%
INTRAVENOUS | Status: AC
  Administered 2016-07-06: 100 mL
  Filled 2016-07-06: qty 100

## 2016-07-06 MED ORDER — INDOMETHACIN 50 MG PO CAPS
50.0000 mg | ORAL_CAPSULE | Freq: Two times a day (BID) | ORAL | Status: DC
  Administered 2016-07-06 (×2): 50 mg via ORAL
  Filled 2016-07-06 (×3): qty 1

## 2016-07-06 NOTE — Progress Notes (Signed)
Subjective:  Continues to reports 10/10 right sided chest pain, 6/10 left sided chest pain, reproducible. Cardiac workup unremarkable.   Objective:  Vital Signs in the last 24 hours: Temp:  [97.2 F (36.2 C)-98.6 F (37 C)] 97.9 F (36.6 C) (08/26 0550) Pulse Rate:  [0-97] 71 (08/26 0550) Resp:  [0-25] 18 (08/26 0422) BP: (97-148)/(64-101) 119/73 (08/26 0550) SpO2:  [0 %-100 %] 96 % (08/26 0550) Weight:  [84.4 kg (186 lb)] 84.4 kg (186 lb) (08/25 1017)  Intake/Output from previous day: 08/25 0701 - 08/26 0700 In: 500 [I.V.:500] Out: -   Physical Exam: General appearance: alert, cooperative, appears stated age and mild distress Eyes: negative Neck: no adenopathy, no carotid bruit, no JVD, supple, symmetrical, trachea midline and thyroid not enlarged, symmetric, no tenderness/mass/nodules Resp: clear to auscultation bilaterally Chest wall: right sided chest wall tenderness Cardio: regular rate and rhythm, S1, S2 normal, no murmur, click, rub or gallop GI: soft, non-tender; bowel sounds normal; no masses,  no organomegaly Extremities: extremities normal, atraumatic, no cyanosis or edema Pulses: 2+ and symmetric, right radial access site asymptomatic Skin: Skin color, texture, turgor normal. No rashes or lesions Neurologic: Grossly normal  Lab Results: BMP  Recent Labs  10/20/15 2000 01/18/16 1951 07/05/16 1034  NA 142 144 141  K 3.4* 3.9 3.6  CL 112* 106 111  CO2 22 26 18*  GLUCOSE 107* 92 82  BUN 11 9 14   CREATININE 0.91 1.22* 0.92  CALCIUM 9.5 9.1 9.8  GFRNONAA >60 50* >60  GFRAA >60 58* >60    CBC  Recent Labs Lab 07/06/16 0537  WBC 9.1  RBC 4.48  HGB 13.2  HCT 40.6  PLT 238  MCV 90.6  MCH 29.5  MCHC 32.5  RDW 15.7*    HEMOGLOBIN A1C No results found for: HGBA1C, MPG  Cardiac Panel (last 3 results)  Recent Labs  07/05/16 1841 07/06/16 0048 07/06/16 0537  TROPONINI 1.02* 6.51* 7.09*    BNP (last 3 results) No results for input(s):  PROBNP in the last 8760 hours.  TSH No results for input(s): TSH in the last 8760 hours.  CHOLESTEROL  Recent Labs  07/05/16 1834  CHOL 217*    Hepatic Function Panel  Recent Labs  01/18/16 1951  PROT 7.0  ALBUMIN 3.7  AST 28  ALT 21  ALKPHOS 59  BILITOT 0.3    Imaging: Dg Chest 2 View  Result Date: 07/05/2016 CLINICAL DATA:  Hervey Ard right-sided chest pain for 1 day EXAM: CHEST  2 VIEW COMPARISON:  10/20/2015 FINDINGS: The heart size and mediastinal contours are within normal limits. Both lungs are clear. The visualized skeletal structures are unremarkable. IMPRESSION: No active cardiopulmonary disease. Electronically Signed   By: Inez Catalina M.D.   On: 07/05/2016 11:14   Ct Angio Chest Pe W Or Wo Contrast  Result Date: 07/06/2016 CLINICAL DATA:  Chest pain radiating to right arm. EXAM: CT ANGIOGRAPHY CHEST WITH CONTRAST TECHNIQUE: Multidetector CT imaging of the chest was performed using the standard protocol during bolus administration of intravenous contrast. Multiplanar CT image reconstructions and MIPs were obtained to evaluate the vascular anatomy. CONTRAST:  75 cc Isovue 370 IV COMPARISON:  Chest x-ray 07/05/2016 FINDINGS: Cardiovascular: No filling defects in the pulmonary arteries to suggest pulmonary emboli. Heart is borderline in size. Aorta is normal caliber. Mediastinum/Nodes: Small scattered mediastinal and bilateral axillary and hilar lymph nodes. None pathologically enlarged. Lungs/Pleura: Right base atelectasis. Mild dependent ground-glass opacities posteriorly in the upper lobes bilaterally, likely atelectasis.  No visible effusions. Upper Abdomen: Imaging into the upper abdomen shows no acute findings. Musculoskeletal: Chest wall soft tissues are unremarkable. Review of the MIP images confirms the above findings. No acute bony abnormality or focal bone lesion. IMPRESSION: No evidence of pulmonary embolus. Right lower lobe atelectasis Electronically Signed   By: Rolm Baptise M.D.   On: 07/06/2016 08:22    Cardiac Studies:  EKG 07/05/2016: Sinus rhythm at a rate of 88 bpm, normal axis, normal intervals, no evidence of ischemia. QT/QTc 418/506 ms  Coronary Angiogram 07/05/2016: Left ventricle: Normal LV systolic function EF 67-67%. Normal coronary arteries. Right dominant circumflex lesion.  Assessment/Plan:  1. Atypical Chest Pain 2. Elevated troponin 3. Asthma 4. Celiac Disease 5. GERD 6. Bipolar/Schizophrenia  Recommendation: uncertain etiology of elevated troponin, normal cath, Chest CT, echo. Continues to have persistent pain, reproducible. Trial NSAID to see if symptoms will improve.   Rachel Bo, NP-C 07/06/2016, 9:48 AM [facility] Pager: [phone] Office: [phone]

## 2016-07-06 NOTE — Progress Notes (Signed)
Patient has a troponin level of 7.09 as of 0537, Dr. Einar Gip notified. Dr. Einar Gip said he was already aware. Patient just went down for CT.

## 2016-07-06 NOTE — Progress Notes (Signed)
  Echocardiogram 2D Echocardiogram has been performed.  Denise Knight 07/06/2016, 9:34 AM

## 2016-07-06 NOTE — Progress Notes (Signed)
Nurse noticed  0048 troponin of 6.51, no critical lab notification. Pt VSS. EKG done and on chart. Pt complaining of 7/10 cramping R chest pain. MD notified. New orders placed.   Raliegh Ip RN

## 2016-07-07 LAB — CBC
HCT: 40.1 % (ref 36.0–46.0)
HEMOGLOBIN: 12.5 g/dL (ref 12.0–15.0)
MCH: 28.7 pg (ref 26.0–34.0)
MCHC: 31.2 g/dL (ref 30.0–36.0)
MCV: 92.2 fL (ref 78.0–100.0)
PLATELETS: 241 10*3/uL (ref 150–400)
RBC: 4.35 MIL/uL (ref 3.87–5.11)
RDW: 15.3 % (ref 11.5–15.5)
WBC: 8.1 10*3/uL (ref 4.0–10.5)

## 2016-07-07 MED ORDER — INDOMETHACIN 50 MG PO CAPS: 50.0000 mg | ORAL_CAPSULE | Freq: Two times a day (BID) | ORAL | 0 refills | Status: DC

## 2016-07-07 MED ORDER — ATORVASTATIN CALCIUM 80 MG PO TABS
80.0000 mg | ORAL_TABLET | Freq: Every day | ORAL | 1 refills | Status: DC
Start: 2016-07-07 — End: 2017-06-16

## 2016-07-07 MED ORDER — METOPROLOL SUCCINATE ER 25 MG PO TB24: 25.0000 mg | ORAL_TABLET | Freq: Every day | ORAL | 1 refills | Status: DC

## 2016-07-07 MED ORDER — NITROGLYCERIN 0.4 MG SL SUBL: 0.4000 mg | SUBLINGUAL_TABLET | SUBLINGUAL | 1 refills | Status: DC | PRN

## 2016-07-07 MED ORDER — INDOMETHACIN 50 MG PO CAPS
50.0000 mg | ORAL_CAPSULE | Freq: Two times a day (BID) | ORAL | Status: DC
  Administered 2016-07-07: 50 mg via ORAL
  Filled 2016-07-07 (×2): qty 1

## 2016-07-07 MED ORDER — CLOPIDOGREL BISULFATE 75 MG PO TABS: 75.0000 mg | ORAL_TABLET | Freq: Every day | ORAL | 3 refills | Status: DC

## 2016-07-07 NOTE — Progress Notes (Signed)
Patient in bathroom on the toilet complaining of dizziness. Very unsteady on her feet. Two nurses and a tech had to use a stedy lift to get her out of the bathroom.

## 2016-07-07 NOTE — Discharge Summary (Signed)
Physician Discharge Summary  Patient ID: Denise Knight MRN: [id number] DOB/AGE: 1966/02/19 50 y.o.  Admit date: 07/05/2016 Discharge date: 07/07/2016  Discharge Diagnoses: 1. Atypical Chest Pain mostly consistent with musculoskeletal etiology. 2. Elevated troponin with minimal EKG abnormality suggestive of NSTEMI inferior wall vs focal pericarditis.  3. Asthma 4. Celiac Disease 5. GERD 6. Bipolar/Schizophrenia 7. Hypertension 8. Mild hyperlipidemia  Significant Diagnostic Studies: Coronary Angiogram 07/05/2016: Left ventricle: Normal LV systolic function EF 00-37%. Normal coronary arteries. Right dominant circulation.  Echo 07/06/16: Normal LVEF. No wall motion abnormality. Mild TR. No pulmonary hypertension.  Hospital Course:  Denise Knight  is a 50 y.o. female with a history of asthma, bipolar disorder, GERD, PTSD, and schizophrenia, presenting to the ED for chest pain. She woke on the morning of 07/05/2016 around 0915 with sharp right sided chest pain. Symptoms occured intermittently lasting just a few minutes at a time with associated shortness of breath. Sitting up and deep breathing seems to make the pain worse. Has history of HTN and occasional tobacco use, no DM, HLD, or thyroid disease. D-dimer negative, initial troponin 0, repeat troponin elevated with peak of 7.09. Reports persistent dry cough over the past several weeks. Etiology of elevated troponin possibly related to coronary spasm vs focal pericarditis given normal cath but abnormal EKG with TWI in inferior leads,  Normal Chest CT, echo.   Recommendations on discharge: Add indomethacin for 1 week, troponin leak could be related to pericarditis although EKG, echocardiogram and CT scan did not reveal any effusion or significant changes. Follow up outpatient for reevaluation.   Discharge Exam: Blood pressure 105/68, pulse 77, temperature 98 F (36.7 C), temperature source Oral, resp. rate 15, height 5' 8.5" (1.74 m),  weight 84.4 kg (186 lb), SpO2 98 %.    General appearance: alert, cooperative, appears stated age and mild distress Eyes: negative Neck: no adenopathy, no carotid bruit, no JVD, supple, symmetrical, trachea midline and thyroid not enlarged, symmetric, no tenderness/mass/nodules Resp: clear to auscultation bilaterally Chest wall: right sided chest wall tenderness Cardio: regular rate and rhythm, S1, S2 normal, no murmur, click, rub or gallop GI: soft, non-tender; bowel sounds normal; no masses, no organomegaly Extremities: extremities normal, atraumatic, no cyanosis or edema Pulses: 2+ and symmetric, right radial access site asymptomatic Skin: Skin color, texture, turgor normal. No rashes or lesions Neurologic: Grossly normal  Labs:   Lab Results  Component Value Date   WBC 8.1 07/07/2016   HGB 12.5 07/07/2016   HCT 40.1 07/07/2016   MCV 92.2 07/07/2016   PLT 241 07/07/2016    Recent Labs Lab 07/05/16 1034  NA 141  K 3.6  CL 111  CO2 18*  BUN 14  CREATININE 0.92  CALCIUM 9.8  GLUCOSE 82    Lipid Panel     Component Value Date/Time   CHOL 217 (H) 07/05/2016 1834   TRIG 107 07/05/2016 1834   HDL 59 07/05/2016 1834   CHOLHDL 3.7 07/05/2016 1834   VLDL 21 07/05/2016 1834   LDLCALC 137 (H) 07/05/2016 1834    Recent Labs  07/06/16 0048 07/06/16 0537 07/06/16 1822  TROPONINI 6.51* 7.09* 4.02*   EKG 07/05/2016: Sinus rhythm at a rate of 88 bpm, normal axis, normal intervals, no evidence of ischemia. QT/QTc 418/506 ms  Radiology: Dg Chest 2 View  Result Date: 07/05/2016 CLINICAL DATA:  Hervey Ard right-sided chest pain for 1 day EXAM: CHEST  2 VIEW COMPARISON:  10/20/2015 FINDINGS: The heart size and mediastinal contours are within normal limits. Both  lungs are clear. The visualized skeletal structures are unremarkable. IMPRESSION: No active cardiopulmonary disease. Electronically Signed   By: Inez Catalina M.D.   On: 07/05/2016 11:14   Ct Angio Chest Pe W Or Wo  Contrast  Result Date: 07/06/2016 CLINICAL DATA:  Chest pain radiating to right arm. EXAM: CT ANGIOGRAPHY CHEST WITH CONTRAST TECHNIQUE: Multidetector CT imaging of the chest was performed using the standard protocol during bolus administration of intravenous contrast. Multiplanar CT image reconstructions and MIPs were obtained to evaluate the vascular anatomy. CONTRAST:  75 cc Isovue 370 IV COMPARISON:  Chest x-ray 07/05/2016 FINDINGS: Cardiovascular: No filling defects in the pulmonary arteries to suggest pulmonary emboli. Heart is borderline in size. Aorta is normal caliber. Mediastinum/Nodes: Small scattered mediastinal and bilateral axillary and hilar lymph nodes. None pathologically enlarged. Lungs/Pleura: Right base atelectasis. Mild dependent ground-glass opacities posteriorly in the upper lobes bilaterally, likely atelectasis. No visible effusions. Upper Abdomen: Imaging into the upper abdomen shows no acute findings. Musculoskeletal: Chest wall soft tissues are unremarkable. Review of the MIP images confirms the above findings. No acute bony abnormality or focal bone lesion. IMPRESSION: No evidence of pulmonary embolus. Right lower lobe atelectasis Electronically Signed   By: Rolm Baptise M.D.   On: 07/06/2016 08:22    FOLLOW UP PLANS AND APPOINTMENTS    Medication List    STOP taking these medications   hydrochlorothiazide 12.5 MG tablet Commonly known as:  HYDRODIURIL   naproxen 500 MG tablet Commonly known as:  NAPROSYN     TAKE these medications   albuterol 108 (90 Base) MCG/ACT inhaler Commonly known as:  PROVENTIL HFA;VENTOLIN HFA Inhale 2 puffs into the lungs every 6 (six) hours as needed for wheezing or shortness of breath.   atorvastatin 80 MG tablet Commonly known as:  LIPITOR Take 1 tablet (80 mg total) by mouth daily at 6 PM.   buPROPion 75 MG tablet Commonly known as:  WELLBUTRIN Take 75 mg by mouth 2 (two) times daily.   clopidogrel 75 MG tablet Commonly known  as:  PLAVIX Take 1 tablet (75 mg total) by mouth daily with breakfast.   diphenhydrAMINE 25 MG tablet Commonly known as:  BENADRYL Take 1 tablet (25 mg total) by mouth every 8 (eight) hours as needed for itching.   ferrous sulfate 325 (65 FE) MG tablet Take 325 mg by mouth daily.   hydrOXYzine 50 MG tablet Commonly known as:  ATARAX/VISTARIL Take 50 mg by mouth 2 (two) times daily as needed for anxiety.   indomethacin 50 MG capsule Commonly known as:  INDOCIN Take 1 capsule (50 mg total) by mouth 2 (two) times daily with a meal.   lithium carbonate 300 MG capsule Take 300 mg by mouth 3 (three) times daily.   methocarbamol 500 MG tablet Commonly known as:  ROBAXIN Take 1 tablet (500 mg total) by mouth 2 (two) times daily.   metoprolol succinate 25 MG 24 hr tablet Commonly known as:  TOPROL XL Take 1 tablet (25 mg total) by mouth daily.   mometasone 50 MCG/ACT nasal spray Commonly known as:  NASONEX Place 1 spray into both nostrils daily.   nitroGLYCERIN 0.4 MG SL tablet Commonly known as:  NITROSTAT Place 1 tablet (0.4 mg total) under the tongue every 5 (five) minutes x 3 doses as needed for chest pain.   omeprazole 40 MG capsule Commonly known as:  PRILOSEC Take 40 mg by mouth 2 (two) times daily.   traZODone 150 MG tablet Commonly known as:  DESYREL Take 200 mg by mouth at bedtime.      Follow-up Information    Adrian Prows, MD. Schedule an appointment as soon as possible for a visit in 2 week(s).   Specialty:  Cardiology Contact information: [address] [place] [address] [phone]            Adrian Prows, MD 07/07/2016, 10:18 AM  Pager: [phone] Office: [phone] If no answer: [phone]

## 2016-07-07 NOTE — Discharge Instructions (Signed)
Cardiac-Specific Troponin I and T Test WHY AM I HAVING THIS TEST? You may have this test if you have experienced chest pain. The test can be used to determine if you have had a heart attack or injury to heart (cardiac) muscle. This test can also help predict the possibility of future heart attacks. This test measures the concentration of cardiac-specific troponin in your blood. Troponins are proteins that help muscles contract. There are three forms of troponin, including troponins C, I, and T. The types of troponins I and T that are found in cardiac muscle are different from the troponins I and T that are found in skeletal muscle. Therefore, testing can be done for cardiac-specific troponins I and T. These types of troponin are normally present in very small quantities in the blood. When there is damage to heart muscle cells, cardiac troponins I and T are released into circulation. The more damage there is, the greater the concentration of troponins I and T. When a person has a heart attack, levels of troponin can become elevated in the blood within 3-4 hours after injury and may remain elevated for 10-14 days. WHAT KIND OF SAMPLE IS TAKEN? A blood sample is required for this test. It is usually collected by inserting a needle into a vein. Usually, an initial blood sample is collected, and then another blood sample is collected 12 hours later. After these samples, you will have your blood tested daily for 3-5 days. You might also have it tested weekly for 5-6 weeks. HOW DO I PREPARE FOR THE TEST? There is no preparation required for this test. However, be aware that you will need to make arrangements to have your blood collected frequently.  WHAT ARE THE REFERENCE RANGES? Reference values are considered healthy values established after testing a large group of healthy people. Reference values may vary among different people, labs, and hospitals. It is your responsibility to obtain your test results. Ask  the lab or department performing the test when and how you will get your results. Reference values for cardiac troponins are as follows:  Cardiac troponin T: less than 0.1 ng/mL.  Cardiac troponin I: less than 0.03 ng/mL. WHAT DO THE RESULTS MEAN? Troponin values above the reference values may indicate:  Injury to the heart muscle.  Heart attack. Talk with your health care provider to discuss your results, treatment options, and if necessary, the need for more tests. Talk with your health care provider if you have any questions about your results.   This information is not intended to replace advice given to you by your health care provider. Make sure you discuss any questions you have with your health care provider.   Document Released: 11/30/2004 Document Revised: 11/18/2014 Document Reviewed: 03/23/2014 Elsevier Interactive Patient Education 2016 [employer]   Chest Wall Pain Chest wall pain is pain in or around the bones and muscles of your chest. Sometimes, an injury causes this pain. Sometimes, the cause may not be known. This pain may take several weeks or longer to get better. HOME CARE Pay attention to any changes in your symptoms. Take these actions to help with your pain:  Rest as told by your doctor.  Avoid activities that cause pain. Try not to use your chest, belly (abdominal), or side muscles to lift heavy things.  If directed, apply ice to the painful area:  Put ice in a plastic bag.  Place a towel between your skin and the bag.  Leave the ice on for  20 minutes, 2-3 times per day.  Take over-the-counter and prescription medicines only as told by your doctor.  Do not use tobacco products, including cigarettes, chewing tobacco, and e-cigarettes. If you need help quitting, ask your doctor.  Keep all follow-up visits as told by your doctor. This is important. GET HELP IF:  You have a fever.  Your chest pain gets worse.  You have new symptoms. GET HELP  RIGHT AWAY IF:  You feel sick to your stomach (nauseous) or you throw up (vomit).  You feel sweaty or light-headed.  You have a cough with phlegm (sputum) or you cough up blood.  You are short of breath.   This information is not intended to replace advice given to you by your health care provider. Make sure you discuss any questions you have with your health care provider.   Document Released: 04/15/2008 Document Revised: 07/19/2015 Document Reviewed: 01/23/2015 Elsevier Interactive Patient Education [employer].

## 2016-07-07 NOTE — Progress Notes (Signed)
Patient is ready for discharge. Education a\understood, IV and Telemetry box out. CCMD called. Family waiting at main entrance.

## 2016-07-08 ENCOUNTER — Encounter (HOSPITAL_COMMUNITY): Payer: Self-pay | Admitting: Cardiology

## 2016-07-23 ENCOUNTER — Encounter (HOSPITAL_COMMUNITY): Payer: Self-pay | Admitting: Cardiology

## 2016-08-21 ENCOUNTER — Emergency Department (HOSPITAL_COMMUNITY): Payer: BLUE CROSS/BLUE SHIELD

## 2016-08-21 ENCOUNTER — Emergency Department (HOSPITAL_COMMUNITY)
Admission: EM | Admit: 2016-08-21 | Discharge: 2016-08-21 | Disposition: A | Discharge status: Home / Self Care | Payer: BLUE CROSS/BLUE SHIELD | Attending: Emergency Medicine | Admitting: Emergency Medicine

## 2016-08-21 ENCOUNTER — Encounter (HOSPITAL_COMMUNITY): Payer: Self-pay

## 2016-08-21 DIAGNOSIS — M25562 Pain in left knee: Secondary | ICD-10-CM | POA: Insufficient documentation

## 2016-08-21 DIAGNOSIS — F17210 Nicotine dependence, cigarettes, uncomplicated: Secondary | ICD-10-CM | POA: Insufficient documentation

## 2016-08-21 DIAGNOSIS — Z79899 Other long term (current) drug therapy: Secondary | ICD-10-CM | POA: Insufficient documentation

## 2016-08-21 DIAGNOSIS — J45909 Unspecified asthma, uncomplicated: Secondary | ICD-10-CM | POA: Insufficient documentation

## 2016-08-21 DIAGNOSIS — I10 Essential (primary) hypertension: Secondary | ICD-10-CM | POA: Insufficient documentation

## 2016-08-21 MED ORDER — DICLOFENAC SODIUM 50 MG PO TBEC: 50.0000 mg | DELAYED_RELEASE_TABLET | Freq: Two times a day (BID) | ORAL | 0 refills | Status: DC

## 2016-08-21 MED ORDER — HYDROCODONE-ACETAMINOPHEN 5-325 MG PO TABS
1.0000 | ORAL_TABLET | Freq: Once | ORAL | Status: AC
  Administered 2016-08-21: 1 via ORAL
  Filled 2016-08-21: qty 1

## 2016-08-21 MED ORDER — HYDROCODONE-ACETAMINOPHEN 5-325 MG PO TABS: 1.0000 | ORAL_TABLET | Freq: Four times a day (QID) | ORAL | 0 refills | Status: DC | PRN

## 2016-08-21 NOTE — ED Provider Notes (Signed)
[facility] DEPT Provider Note   CSN: [id number] Arrival date & time: 08/21/16  1557     History   Chief Complaint No chief complaint on file.   HPI Denise Knight is a 50 y.o. female who presents to the ED with knee pain. The pain is located in the left knee. Patient reports that she has had similar problem in the past that required aspiration of the fluid from the knee by Dr. Berenice Primas. Patient works in house keeping at a hotel and reports having to bend and walk a lot that has cause the swelling and pain to increase over the past 2 days. Patient states she called her orthopedic doctor Dr. Berenice Primas today but they did not have any appointments.   The history is provided by the patient. No language interpreter was used.  Knee Pain   This is a new problem. The current episode started more than 2 days ago. The problem occurs constantly. The problem has been gradually worsening. The pain is present in the left knee. The pain is moderate. She has tried nothing for the symptoms. There has been no history of extremity trauma.    Past Medical History:  Diagnosis Date  . Anxiety   . Arthritis    "left knee" (07/05/2016)  . Asthma   . Bipolar 1 disorder ([place])   . Celiac disease    pt denies this hx on 07/05/2016  . Depression   . GERD (gastroesophageal reflux disease)   . Hypertension   . Post traumatic stress disorder   . Schizophrenia ([place])   . Stomach ulcer     Patient Active Problem List   Diagnosis Date Noted  . Chest pain 07/05/2016    Past Surgical History:  Procedure Laterality Date  . CARDIAC CATHETERIZATION  07/05/2016  . CARDIAC CATHETERIZATION N/A 07/05/2016   Procedure: Left Heart Cath and Coronary Angiography;  Surgeon: Adrian Prows, MD;  Location: [facility];  Service: Cardiovascular;  Laterality: N/A;  . CYST EXCISION Right    "wrist"  . DILATION AND CURETTAGE OF UTERUS    . FOOT SURGERY Bilateral    "corns removed"  . TUBAL LIGATION      OB History      No data available       Home Medications    Prior to Admission medications   Medication Sig Start Date End Date Taking? Authorizing Provider  albuterol (PROVENTIL HFA;VENTOLIN HFA) 108 (90 BASE) MCG/ACT inhaler Inhale 2 puffs into the lungs every 6 (six) hours as needed for wheezing or shortness of breath.    Historical Provider, MD  atorvastatin (LIPITOR) 80 MG tablet Take 1 tablet (80 mg total) by mouth daily at 6 PM. 07/07/16   Adrian Prows, MD  buPROPion (WELLBUTRIN) 75 MG tablet Take 75 mg by mouth 2 (two) times daily.    Historical Provider, MD  clopidogrel (PLAVIX) 75 MG tablet Take 1 tablet (75 mg total) by mouth daily with breakfast. 07/07/16   Adrian Prows, MD  diclofenac (VOLTAREN) 50 MG EC tablet Take 1 tablet (50 mg total) by mouth 2 (two) times daily. 08/21/16   Khyree Carillo Bunnie Pion, NP  diphenhydrAMINE (BENADRYL) 25 MG tablet Take 1 tablet (25 mg total) by mouth every 8 (eight) hours as needed for itching. 02/27/15   [name], PA-C  ferrous sulfate 325 (65 FE) MG tablet Take 325 mg by mouth daily.    Historical Provider, MD  HYDROcodone-acetaminophen (NORCO) 5-325 MG tablet Take 1 tablet by mouth every  6 (six) hours as needed. 08/21/16   Bodie Abernethy Bunnie Pion, NP  hydrOXYzine (ATARAX/VISTARIL) 50 MG tablet Take 50 mg by mouth 2 (two) times daily as needed for anxiety.     Historical Provider, MD  indomethacin (INDOCIN) 50 MG capsule Take 1 capsule (50 mg total) by mouth 2 (two) times daily with a meal. 07/07/16   Neldon Labella, NP  lithium carbonate 300 MG capsule Take 300 mg by mouth 3 (three) times daily.     Historical Provider, MD  methocarbamol (ROBAXIN) 500 MG tablet Take 1 tablet (500 mg total) by mouth 2 (two) times daily. 02/03/16   [place] Lions, PA-C  metoprolol succinate (TOPROL XL) 25 MG 24 hr tablet Take 1 tablet (25 mg total) by mouth daily. 07/07/16   Adrian Prows, MD  mometasone (NASONEX) 50 MCG/ACT nasal spray Place 1 spray into both nostrils daily.     Historical  Provider, MD  nitroGLYCERIN (NITROSTAT) 0.4 MG SL tablet Place 1 tablet (0.4 mg total) under the tongue every 5 (five) minutes x 3 doses as needed for chest pain. 07/07/16   Adrian Prows, MD  omeprazole (PRILOSEC) 40 MG capsule Take 40 mg by mouth 2 (two) times daily.     Historical Provider, MD  traZODone (DESYREL) 150 MG tablet Take 200 mg by mouth at bedtime.    Historical Provider, MD    Family History Family History  Problem Relation Age of Onset  . Breast cancer    . Lung cancer    . Congestive Heart Failure    . Diabetes    . Hypertension      Social History Social History  Substance Use Topics  . Smoking status: Current Some Day Smoker    Packs/day: 0.25    Years: 35.00    Types: Cigarettes  . Smokeless tobacco: Never Used  . Alcohol use 8.4 oz/week    14 Cans of beer per week     Allergies   Aspirin and Food   Review of Systems Review of Systems Negative except as stated in HPI  Physical Exam Updated Vital Signs BP 135/94 (BP Location: Left Arm)   Pulse 71   Temp 97.9 F (36.6 C) (Oral)   Resp 18   Ht 5' 8"  (1.727 m)   Wt 77.1 kg   SpO2 99%   BMI 25.85 kg/m   Physical Exam  Constitutional: She is oriented to person, place, and time. She appears well-developed and well-nourished.  HENT:  Head: Normocephalic.  Eyes: EOM are normal.  Neck: Normal range of motion. Neck supple.  Cardiovascular: Normal rate.   Pulmonary/Chest: Effort normal.  Abdominal: Soft. There is no tenderness.  Musculoskeletal:       Left knee: She exhibits swelling. She exhibits no laceration, no erythema, normal alignment and normal patellar mobility. Decreased range of motion: due to pain. Tenderness found.       Legs: Swelling and tenderness on palpation and with flexion and extension of the left knee. Pedal pulses 2+, adequate circulation, good touch sensation. I am able to do full passive range of motion but the patient does complain of pain during exam.   Neurological: She is  alert and oriented to person, place, and time. No cranial nerve deficit.  Skin: Skin is warm and dry.  Psychiatric: She has a normal mood and affect. Her behavior is normal.  Nursing note and vitals reviewed.    ED Treatments / Results  Labs (all labs ordered are listed, but only abnormal  results are displayed) Labs Reviewed - No data to display  Radiology Dg Knee Complete 4 Views Left  Result Date: 08/21/2016 CLINICAL DATA:  Left knee pain and swelling for 4 days without known injury. EXAM: LEFT KNEE - COMPLETE 4+ VIEW COMPARISON:  Radiographs of March 03, 2016. FINDINGS: No evidence of fracture, dislocation, or joint effusion. No evidence of arthropathy or other focal bone abnormality. Soft tissues are unremarkable. IMPRESSION: Normal left knee. Electronically Signed   By: Marijo Conception, M.D.   On: 08/21/2016 17:01    Procedures Procedures (including critical care time)  Medications Ordered in ED Medications  HYDROcodone-acetaminophen (NORCO/VICODIN) 5-325 MG per tablet 1 tablet (1 tablet Oral Given 08/21/16 1722)     Initial Impression / Assessment and Plan / ED Course  I have reviewed the triage vital signs and the nursing notes.  Pertinent labs & imaging results that were available during my care of the patient were reviewed by me and considered in my medical decision making (see chart for details).  Clinical Course    Final Clinical Impressions(s) / ED Diagnoses  50 y.o. female with swelling and tenderness to the left knee stable for d/c without focal neuro deficits, no red streaking, no fever does not appear to have septic joint. Knee immobilizer applied, crutches and ice. Patient to f/u with her orthopedic doctor.   Final diagnoses:  Knee pain, left anterior    New Prescriptions New Prescriptions   DICLOFENAC (VOLTAREN) 50 MG EC TABLET    Take 1 tablet (50 mg total) by mouth 2 (two) times daily.   HYDROCODONE-ACETAMINOPHEN (NORCO) 5-325 MG TABLET    Take 1  tablet by mouth every 6 (six) hours as needed.     Steele, NP 08/21/16 Davidson, MD 08/21/16 [phone]

## 2016-08-21 NOTE — Discharge Instructions (Signed)
Call Dr. Jackalyn Lombard office for follow up. Return here as needed.

## 2016-08-21 NOTE — ED Triage Notes (Signed)
Patient complains of increasing left knee pain sinced Saturday. Has had problems in past with same and had effusion.

## 2016-08-21 NOTE — ED Notes (Signed)
Declined W/C at D/C and was escorted to lobby by RN.Declined W/C at D/C and was escorted to lobby by RN.

## 2016-10-01 ENCOUNTER — Encounter: Payer: Self-pay | Admitting: Gastroenterology

## 2016-11-18 ENCOUNTER — Telehealth: Payer: Self-pay | Admitting: *Deleted

## 2016-11-18 NOTE — Telephone Encounter (Signed)
Spoke with patient. Gave her Dr.Jacobs recommendations. She understands to call us back after cardiac clearance is completed and she is okay for colonoscopy.

## 2016-11-18 NOTE — Telephone Encounter (Signed)
She must get in with cardiologist before any elective procedures with [place].  thanks

## 2016-11-18 NOTE — Telephone Encounter (Signed)
Dr.Jacobs, Patient seen you in the office on 04/10/16, she is for direct colonoscopy for heme + stools on 12/09/16. Patient went to ED on 07/05/16 for chest pain, patient had MI and cardiac cath done. She was prescribed Plavix but she told me she never pick rx up due to cost. She also states she has not seen cardiac MD since MI. Is patient okay for direct [facility] colon at this time or needs cardiac clearance or OV with you? Please  Advise. Thank you, Colby Catanese PV

## 2016-12-09 ENCOUNTER — Encounter: Payer: Self-pay | Admitting: Gastroenterology

## 2017-03-21 ENCOUNTER — Encounter (HOSPITAL_COMMUNITY): Payer: Self-pay

## 2017-03-21 ENCOUNTER — Emergency Department (HOSPITAL_COMMUNITY): Payer: Self-pay

## 2017-03-21 ENCOUNTER — Emergency Department (HOSPITAL_COMMUNITY)
Admission: EM | Admit: 2017-03-21 | Discharge: 2017-03-21 | Disposition: A | Discharge status: Home / Self Care | Payer: Self-pay | Attending: Emergency Medicine | Admitting: Emergency Medicine

## 2017-03-21 DIAGNOSIS — F17210 Nicotine dependence, cigarettes, uncomplicated: Secondary | ICD-10-CM | POA: Insufficient documentation

## 2017-03-21 DIAGNOSIS — J45909 Unspecified asthma, uncomplicated: Secondary | ICD-10-CM | POA: Insufficient documentation

## 2017-03-21 DIAGNOSIS — R0789 Other chest pain: Secondary | ICD-10-CM | POA: Insufficient documentation

## 2017-03-21 DIAGNOSIS — I10 Essential (primary) hypertension: Secondary | ICD-10-CM | POA: Insufficient documentation

## 2017-03-21 DIAGNOSIS — Z79899 Other long term (current) drug therapy: Secondary | ICD-10-CM | POA: Insufficient documentation

## 2017-03-21 LAB — I-STAT TROPONIN, ED
TROPONIN I, POC: 0 ng/mL (ref 0.00–0.08)
Troponin i, poc: 0 ng/mL (ref 0.00–0.08)

## 2017-03-21 LAB — CBC
HEMATOCRIT: 38.8 % (ref 36.0–46.0)
HEMOGLOBIN: 12.7 g/dL (ref 12.0–15.0)
MCH: 29.4 pg (ref 26.0–34.0)
MCHC: 32.7 g/dL (ref 30.0–36.0)
MCV: 89.8 fL (ref 78.0–100.0)
Platelets: 259 10*3/uL (ref 150–400)
RBC: 4.32 MIL/uL (ref 3.87–5.11)
RDW: 14.6 % (ref 11.5–15.5)
WBC: 7.3 10*3/uL (ref 4.0–10.5)

## 2017-03-21 LAB — BASIC METABOLIC PANEL
Anion gap: 10 (ref 5–15)
BUN: 13 mg/dL (ref 6–20)
CHLORIDE: 107 mmol/L (ref 101–111)
CO2: 21 mmol/L — AB (ref 22–32)
Calcium: 9 mg/dL (ref 8.9–10.3)
Creatinine, Ser: 0.9 mg/dL (ref 0.44–1.00)
GFR calc Af Amer: >60 mL/min (ref >60)
GFR calc non Af Amer: >60 mL/min (ref >60)
Glucose, Bld: 79 mg/dL (ref 65–99)
Potassium: 3.7 mmol/L (ref 3.5–5.1)
SODIUM: 138 mmol/L (ref 135–145)

## 2017-03-21 LAB — CBG MONITORING, ED: Glucose-Capillary: 83 mg/dL (ref 65–99)

## 2017-03-21 LAB — D-DIMER, QUANTITATIVE: D-Dimer, Quant: 0.44 ug/mL-FEU (ref 0.00–0.50)

## 2017-03-21 IMAGING — DX DG CHEST 2V
2 series · 2 of 2 positions shown · non-contrast
Comparison: [DATE]

CLINICAL DATA: Chest pain.

EXAM:
CHEST  2 VIEW

[chest pa]
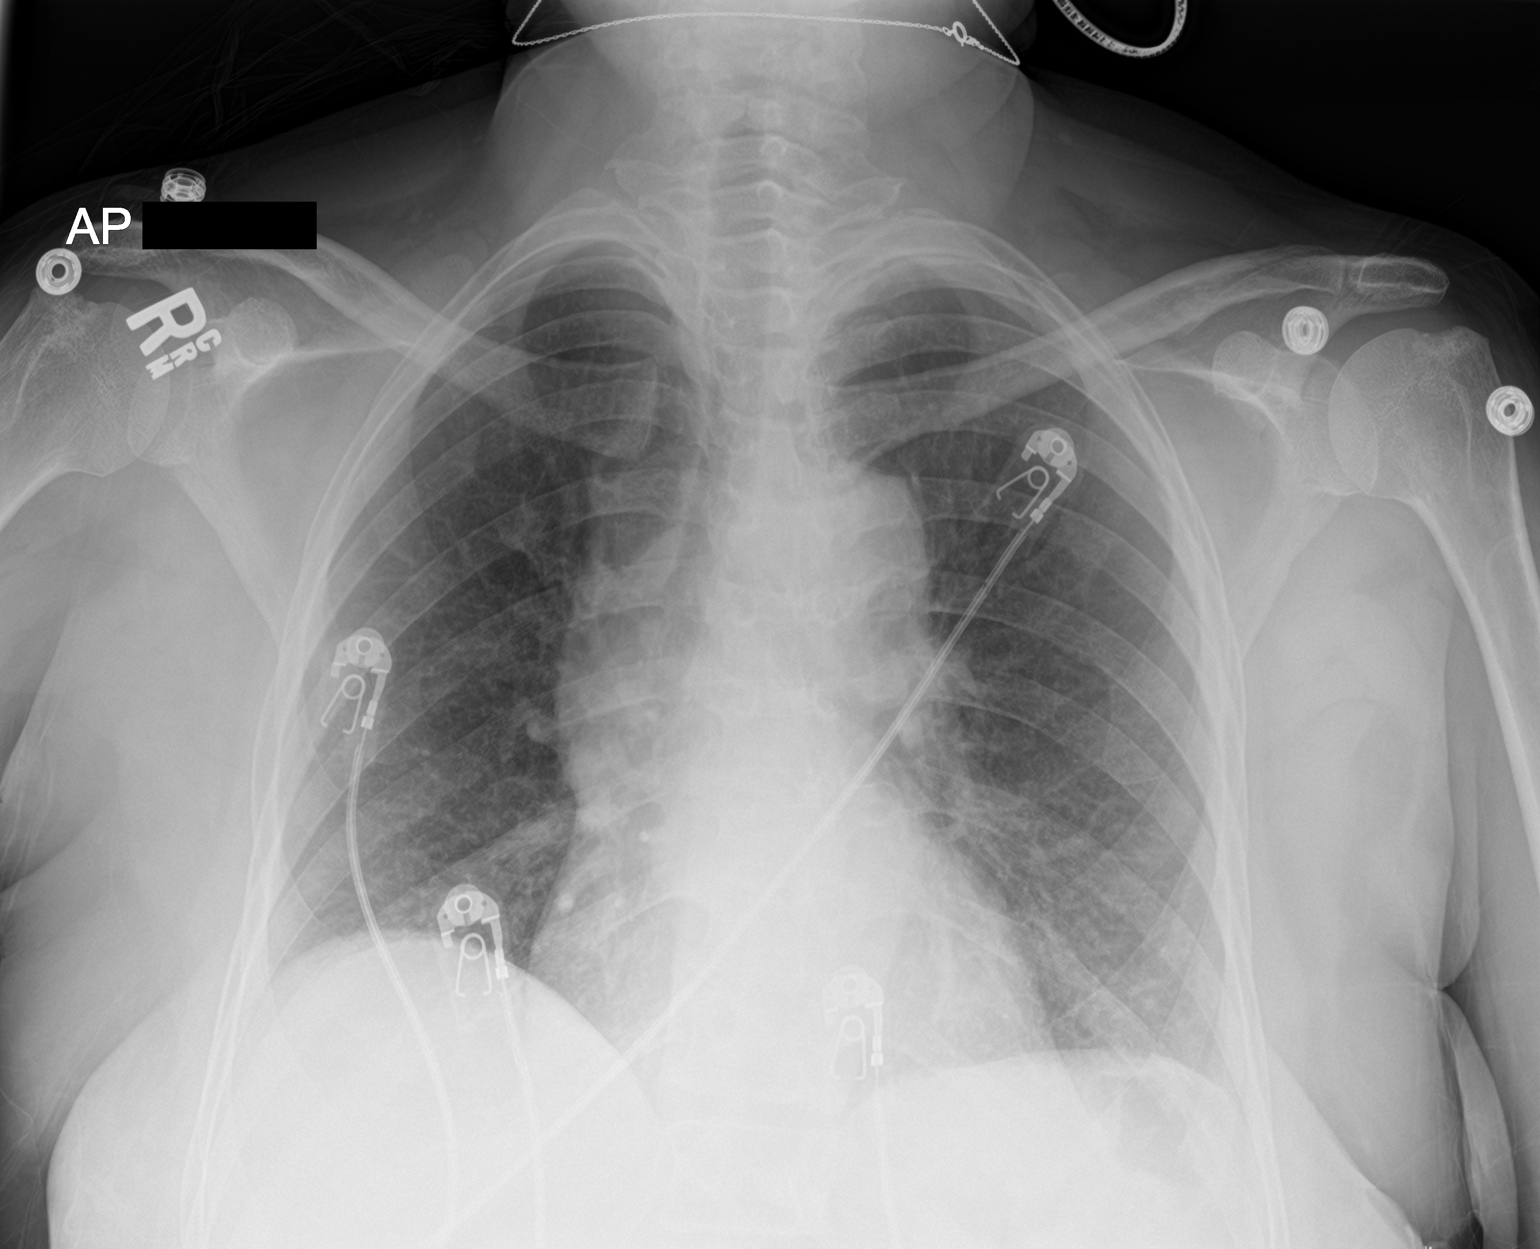

[chest lat]
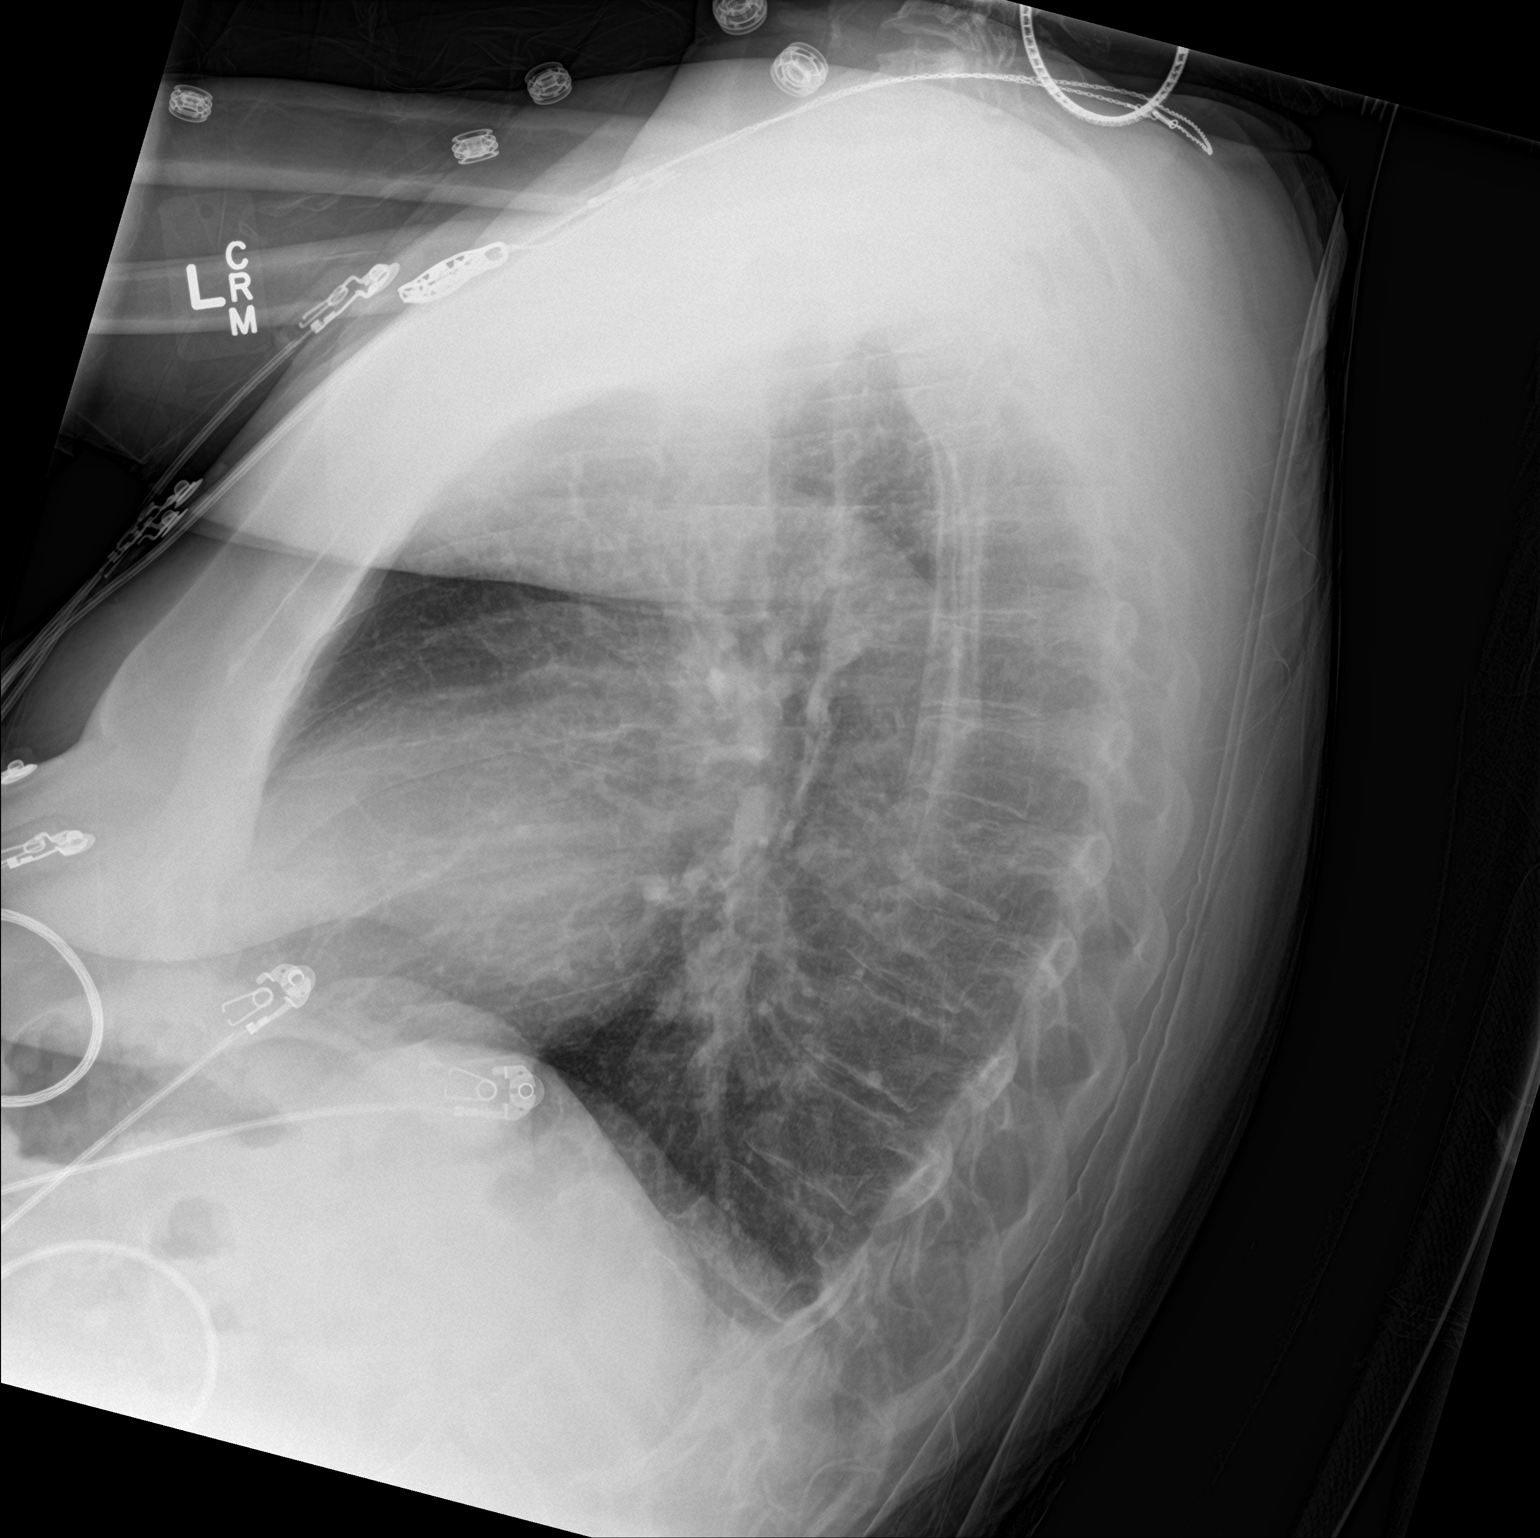

[2 of 2 positions shown; findings below may reference images not displayed]

FINDINGS: Normal heart size. Tortuosity and prominence of the thoracic aorta
again noted. There is elevation of the right hemidiaphragm. Mild
bibasilar atelectasis present. There is no evidence of pulmonary
edema, consolidation, pneumothorax, nodule or pleural fluid. The
bony thorax is unremarkable.
IMPRESSION: Mild bibasilar atelectasis. Tortuosity and prominence of thoracic
aorta.

## 2017-03-21 MED ORDER — ACETAMINOPHEN 325 MG PO TABS: 650.0000 mg | ORAL_TABLET | Freq: Once | ORAL | Status: DC

## 2017-03-21 MED ORDER — HYDROCODONE-ACETAMINOPHEN 5-325 MG PO TABS
1.0000 | ORAL_TABLET | ORAL | Status: AC
  Administered 2017-03-21: 1 via ORAL
  Filled 2017-03-21: qty 1

## 2017-03-21 MED ORDER — HYDROCHLOROTHIAZIDE 25 MG PO TABS
25.0000 mg | ORAL_TABLET | Freq: Every day | ORAL | Status: DC
  Administered 2017-03-21: 25 mg via ORAL
  Filled 2017-03-21: qty 1

## 2017-03-21 NOTE — ED Triage Notes (Signed)
[facility]- pt coming from work with complaint of right side chest pain radiating into her right arm and down her left leg. She is allergic to aspirin but was given 2 nitro with improvement of pain. Also reporting headache. Pt reported to EMS that she had an NSTEMI last year.

## 2017-03-21 NOTE — Discharge Instructions (Signed)
Take over-the-counter medications as needed for pain, follow-up with your primary care doctor if not improving over the next week

## 2017-03-21 NOTE — ED Provider Notes (Signed)
[facility] DEPT Provider Note   CSN: [id number] Arrival date & time: 03/21/17  1004     History   Chief Complaint Chief Complaint  Patient presents with  . Chest Pain    HPI Denise Knight is a 51 y.o. female.  HPI Patient presents to the emergency room with complaints of acute right-sided chest pain. Patient states she was at work today when she's had the sudden onset of right-sided chest pain. Patient states it was initially sharp but then felt more pressure-like. The pain increases with palpation of the chest. She felt some shortness of breath initially but denies any right now. Patient mentioned to EMS that the pain was shooting down her right arm and down her left leg however the patient tells me the pain stating) it is. According to medical records she had an episode of chest pain in the past. She had elevated troponins and her symptoms were felt to be related to either a non-ST elevation MI or pericarditis. According to the medical records the patient had a cardiac catheterization in August 2017 that showed normal coronary anatomy. Past Medical History:  Diagnosis Date  . Anxiety   . Arthritis    "left knee" (07/05/2016)  . Asthma   . Bipolar 1 disorder ([place])   . Celiac disease    pt denies this hx on 07/05/2016  . Depression   . GERD (gastroesophageal reflux disease)   . Hypertension   . Post traumatic stress disorder   . Schizophrenia ([place])   . Stomach ulcer     Patient Active Problem List   Diagnosis Date Noted  . Chest pain 07/05/2016    Past Surgical History:  Procedure Laterality Date  . CARDIAC CATHETERIZATION  07/05/2016  . CARDIAC CATHETERIZATION N/A 07/05/2016   Procedure: Left Heart Cath and Coronary Angiography;  Surgeon: Adrian Prows, MD;  Location: [facility];  Service: Cardiovascular;  Laterality: N/A;  . CYST EXCISION Right    "wrist"  . DILATION AND CURETTAGE OF UTERUS    . FOOT SURGERY Bilateral    "corns removed"  . TUBAL LIGATION       OB History    No data available       Home Medications    Prior to Admission medications   Medication Sig Start Date End Date Taking? Authorizing Provider  hydrochlorothiazide (HYDRODIURIL) 25 MG tablet Take 25 mg by mouth daily.   Yes [provider]  omeprazole (PRILOSEC) 20 MG capsule Take 20 mg by mouth daily.    Yes [provider]  albuterol (PROVENTIL HFA;VENTOLIN HFA) 108 (90 BASE) MCG/ACT inhaler Inhale 2 puffs into the lungs every 6 (six) hours as needed for wheezing or shortness of breath.    [provider]  atorvastatin (LIPITOR) 80 MG tablet Take 1 tablet (80 mg total) by mouth daily at 6 PM. Patient not taking: Reported on 03/21/2017 07/07/16   Adrian Prows, MD  clopidogrel (PLAVIX) 75 MG tablet Take 1 tablet (75 mg total) by mouth daily with breakfast. Patient not taking: Reported on 03/21/2017 07/07/16   Adrian Prows, MD  diclofenac (VOLTAREN) 50 MG EC tablet Take 1 tablet (50 mg total) by mouth 2 (two) times daily. Patient not taking: Reported on 03/21/2017 08/21/16   Ashley Murrain, NP  diphenhydrAMINE (BENADRYL) 25 MG tablet Take 1 tablet (25 mg total) by mouth every 8 (eight) hours as needed for itching. Patient not taking: Reported on 03/21/2017 02/27/15   Jamse Mead, PA-C  HYDROcodone-acetaminophen [facility])  5-325 MG tablet Take 1 tablet by mouth every 6 (six) hours as needed. Patient not taking: Reported on 03/21/2017 08/21/16   Ashley Murrain, NP  indomethacin (INDOCIN) 50 MG capsule Take 1 capsule (50 mg total) by mouth 2 (two) times daily with a meal. Patient not taking: Reported on 03/21/2017 07/07/16   Neldon Labella, NP  methocarbamol (ROBAXIN) 500 MG tablet Take 1 tablet (500 mg total) by mouth 2 (two) times daily. Patient not taking: Reported on 03/21/2017 02/03/16   [place] Lions, PA-C  metoprolol succinate (TOPROL XL) 25 MG 24 hr tablet Take 1 tablet (25 mg total) by mouth daily. Patient not taking: Reported on 03/21/2017  07/07/16   Adrian Prows, MD  nitroGLYCERIN (NITROSTAT) 0.4 MG SL tablet Place 1 tablet (0.4 mg total) under the tongue every 5 (five) minutes x 3 doses as needed for chest pain. Patient not taking: Reported on 03/21/2017 07/07/16   Adrian Prows, MD    Family History Family History  Problem Relation Age of Onset  . Breast cancer Unknown   . Lung cancer Unknown   . Congestive Heart Failure Unknown   . Diabetes Unknown   . Hypertension Unknown     Social History Social History  Substance Use Topics  . Smoking status: Current Some Day Smoker    Packs/day: 0.25    Years: 35.00    Types: Cigarettes  . Smokeless tobacco: Never Used  . Alcohol use 8.4 oz/week    14 Cans of beer per week     Allergies   Aspirin and Food   Review of Systems Review of Systems  All other systems reviewed and are negative.    Physical Exam Updated Vital Signs BP 107/73   Pulse 92   Temp 98.3 F (36.8 C) (Oral)   Resp 13   SpO2 97%   Physical Exam  Constitutional: She appears well-developed and well-nourished. No distress.  HENT:  Head: Normocephalic and atraumatic.  Right Ear: External ear normal.  Left Ear: External ear normal.  Eyes: Conjunctivae are normal. Right eye exhibits no discharge. Left eye exhibits no discharge. No scleral icterus.  Neck: Neck supple. No tracheal deviation present.  Cardiovascular: Normal rate, regular rhythm and intact distal pulses.   Pulmonary/Chest: Effort normal and breath sounds normal. No stridor. No respiratory distress. She has no wheezes. She has no rales. She exhibits tenderness (tenderness to palpation right side anterior chest wall).  Abdominal: Soft. Bowel sounds are normal. She exhibits no distension. There is no tenderness. There is no rebound and no guarding.  Musculoskeletal: She exhibits no edema or tenderness.  Neurological: She is alert. She has normal strength. No cranial nerve deficit (no facial droop, extraocular movements intact, no slurred  speech) or sensory deficit. She exhibits normal muscle tone. She displays no seizure activity. Coordination normal.  Skin: Skin is warm and dry. No rash noted.  Psychiatric: She has a normal mood and affect.  Nursing note and vitals reviewed.    ED Treatments / Results  Labs (all labs ordered are listed, but only abnormal results are displayed) Labs Reviewed  BASIC METABOLIC PANEL - Abnormal; Notable for the following:       Result Value   CO2 21 (*)    All other components within normal limits  CBC  D-DIMER, QUANTITATIVE (NOT AT [facility])  CBG MONITORING, ED  Randolm Idol, ED  Randolm Idol, ED    EKG  EKG Interpretation  Date/Time:  Friday Mar 21 2017 10:10:56 EDT  Ventricular Rate:  80 PR Interval:    QRS Duration: 72 QT Interval:  391 QTC Calculation: 451 R Axis:   62 Text Interpretation:  Sinus rhythm Consider left atrial enlargement Abnormal R-wave progression, early transition inferior t wave changes on prior ECG not present on current ECG Confirmed by Cori Justus  MD-J, Tahesha Skeet ([id number]) on 03/21/2017 10:15:05 AM       Radiology Dg Chest 2 View  Result Date: 03/21/2017 CLINICAL DATA:  Chest pain. EXAM: CHEST  2 VIEW COMPARISON:  07/05/2016 FINDINGS: Normal heart size. Tortuosity and prominence of the thoracic aorta again noted. There is elevation of the right hemidiaphragm. Mild bibasilar atelectasis present. There is no evidence of pulmonary edema, consolidation, pneumothorax, nodule or pleural fluid. The bony thorax is unremarkable. IMPRESSION: Mild bibasilar atelectasis. Tortuosity and prominence of thoracic aorta. Electronically Signed   By: Aletta Edouard M.D.   On: 03/21/2017 10:56    Procedures Procedures (including critical care time)  Medications Ordered in ED Medications  acetaminophen (TYLENOL) tablet 650 mg (650 mg Oral Not Given 03/21/17 1215)  hydrochlorothiazide (HYDRODIURIL) tablet 25 mg (25 mg Oral Given 03/21/17 1215)  HYDROcodone-acetaminophen  (NORCO/VICODIN) 5-325 MG per tablet 1 tablet (1 tablet Oral Given 03/21/17 1114)     Initial Impression / Assessment and Plan / ED Course  I have reviewed the triage vital signs and the nursing notes.  Pertinent labs & imaging results that were available during my care of the patient were reviewed by me and considered in my medical decision making (see chart for details).   patient presents to emergency room with complaints of chest pain after acute onset of chest pain at work today. Patient works as a Scientist, product/process development.  She had chest wall tenderness palpation on exam. Laboratory tests, EKG are reassuring. Patient had a normal cardiac catheterization approximately 1 year ago.  I doubt pulmonary embolism. I doubt acute coronary syndrome. I suspect her pain is related to chest wall inflammation.  Final Clinical Impressions(s) / ED Diagnoses   Final diagnoses:  Chest wall pain    New Prescriptions New Prescriptions   No medications on file     Dorie Rank, MD 03/21/17 1354

## 2017-04-13 ENCOUNTER — Encounter (HOSPITAL_COMMUNITY): Payer: Self-pay | Admitting: Emergency Medicine

## 2017-04-13 ENCOUNTER — Emergency Department (HOSPITAL_COMMUNITY)
Admission: EM | Admit: 2017-04-13 | Discharge: 2017-04-13 | Disposition: A | Discharge status: Home / Self Care | Payer: Self-pay | Attending: Emergency Medicine | Admitting: Emergency Medicine

## 2017-04-13 DIAGNOSIS — J45909 Unspecified asthma, uncomplicated: Secondary | ICD-10-CM | POA: Insufficient documentation

## 2017-04-13 DIAGNOSIS — Z79899 Other long term (current) drug therapy: Secondary | ICD-10-CM | POA: Insufficient documentation

## 2017-04-13 DIAGNOSIS — I10 Essential (primary) hypertension: Secondary | ICD-10-CM | POA: Insufficient documentation

## 2017-04-13 DIAGNOSIS — K625 Hemorrhage of anus and rectum: Secondary | ICD-10-CM

## 2017-04-13 DIAGNOSIS — F17210 Nicotine dependence, cigarettes, uncomplicated: Secondary | ICD-10-CM | POA: Insufficient documentation

## 2017-04-13 LAB — CBC WITH DIFFERENTIAL/PLATELET
BASOS PCT: 1 %
Basophils Absolute: 0.1 10*3/uL (ref 0.0–0.1)
Eosinophils Absolute: 0.3 10*3/uL (ref 0.0–0.7)
Eosinophils Relative: 3 %
HEMATOCRIT: 38.5 % (ref 36.0–46.0)
HEMOGLOBIN: 12.3 g/dL (ref 12.0–15.0)
LYMPHS ABS: 3.2 10*3/uL (ref 0.7–4.0)
LYMPHS PCT: 34 %
MCH: 28.9 pg (ref 26.0–34.0)
MCHC: 31.9 g/dL (ref 30.0–36.0)
MCV: 90.6 fL (ref 78.0–100.0)
MONOS PCT: 5 %
Monocytes Absolute: 0.5 10*3/uL (ref 0.1–1.0)
NEUTROS ABS: 5.3 10*3/uL (ref 1.7–7.7)
NEUTROS PCT: 57 %
Platelets: 228 10*3/uL (ref 150–400)
RBC: 4.25 MIL/uL (ref 3.87–5.11)
RDW: 14.5 % (ref 11.5–15.5)
WBC: 9.3 10*3/uL (ref 4.0–10.5)

## 2017-04-13 LAB — COMPREHENSIVE METABOLIC PANEL
ALBUMIN: 3.6 g/dL (ref 3.5–5.0)
ALT: 28 U/L (ref 14–54)
ANION GAP: 8 (ref 5–15)
AST: 42 U/L — AB (ref 15–41)
Alkaline Phosphatase: 62 U/L (ref 38–126)
BUN: 15 mg/dL (ref 6–20)
CHLORIDE: 108 mmol/L (ref 101–111)
CO2: 23 mmol/L (ref 22–32)
Calcium: 9.5 mg/dL (ref 8.9–10.3)
Creatinine, Ser: 1.12 mg/dL — ABNORMAL HIGH (ref 0.44–1.00)
GFR calc Af Amer: >60 mL/min (ref >60)
GFR calc non Af Amer: 55 mL/min — ABNORMAL LOW (ref >60)
GLUCOSE: 123 mg/dL — AB (ref 65–99)
POTASSIUM: 4.6 mmol/L (ref 3.5–5.1)
SODIUM: 139 mmol/L (ref 135–145)
Total Bilirubin: 0.9 mg/dL (ref 0.3–1.2)
Total Protein: 6.9 g/dL (ref 6.5–8.1)

## 2017-04-13 LAB — URINALYSIS, ROUTINE W REFLEX MICROSCOPIC
BACTERIA UA: NONE SEEN
Bilirubin Urine: NEGATIVE
Glucose, UA: NEGATIVE mg/dL
Ketones, ur: NEGATIVE mg/dL
Leukocytes, UA: NEGATIVE
NITRITE: NEGATIVE
PROTEIN: NEGATIVE mg/dL
Specific Gravity, Urine: 1.017 (ref 1.005–1.030)
pH: 5 (ref 5.0–8.0)

## 2017-04-13 LAB — POC OCCULT BLOOD, ED: Fecal Occult Bld: POSITIVE — AB

## 2017-04-13 MED ORDER — CYCLOBENZAPRINE HCL 10 MG PO TABS: 10.0000 mg | ORAL_TABLET | Freq: Two times a day (BID) | ORAL | 0 refills | Status: DC | PRN

## 2017-04-13 MED ORDER — CYCLOBENZAPRINE HCL 10 MG PO TABS
10.0000 mg | ORAL_TABLET | Freq: Once | ORAL | Status: AC
  Administered 2017-04-13: 10 mg via ORAL
  Filled 2017-04-13: qty 1

## 2017-04-13 MED ORDER — HYDROCORTISONE 2.5 % RE CREA
TOPICAL_CREAM | Freq: Once | RECTAL | Status: AC
  Administered 2017-04-13: 21:00:00 via TOPICAL
  Filled 2017-04-13: qty 28.35

## 2017-04-13 NOTE — ED Provider Notes (Signed)
[facility] DEPT Provider Note   CSN: [id number] Arrival date & time: 04/13/17  1747     History   Chief Complaint Chief Complaint  Patient presents with  . Rectal Bleeding  . Flank Pain    HPI Denise Knight is a 51 y.o. female.  The history is provided by the patient and medical records. No language interpreter was used.  Rectal Bleeding  Quality:  Maroon Amount:  Moderate Duration:  5 days Timing:  Intermittent Chronicity:  Recurrent Context: defecation and hemorrhoids   Similar prior episodes: no   Relieved by:  Nothing Worsened by:  Nothing Ineffective treatments:  None tried Associated symptoms: no abdominal pain, no fever and no vomiting     Past Medical History:  Diagnosis Date  . Anxiety   . Arthritis    "left knee" (07/05/2016)  . Asthma   . Bipolar 1 disorder ([place])   . Celiac disease    pt denies this hx on 07/05/2016  . Depression   . GERD (gastroesophageal reflux disease)   . Hypertension   . Post traumatic stress disorder   . Schizophrenia ([place])   . Stomach ulcer     Patient Active Problem List   Diagnosis Date Noted  . Chest pain 07/05/2016    Past Surgical History:  Procedure Laterality Date  . CARDIAC CATHETERIZATION  07/05/2016  . CARDIAC CATHETERIZATION N/A 07/05/2016   Procedure: Left Heart Cath and Coronary Angiography;  Surgeon: Adrian Prows, MD;  Location: [facility];  Service: Cardiovascular;  Laterality: N/A;  . CYST EXCISION Right    "wrist"  . DILATION AND CURETTAGE OF UTERUS    . FOOT SURGERY Bilateral    "corns removed"  . TUBAL LIGATION      OB History    No data available       Home Medications    Prior to Admission medications   Medication Sig Start Date End Date Taking? Authorizing Provider  albuterol (PROVENTIL HFA;VENTOLIN HFA) 108 (90 BASE) MCG/ACT inhaler Inhale 2 puffs into the lungs every 6 (six) hours as needed for wheezing or shortness of breath.    [provider]  atorvastatin  (LIPITOR) 80 MG tablet Take 1 tablet (80 mg total) by mouth daily at 6 PM. Patient not taking: Reported on 03/21/2017 07/07/16   Adrian Prows, MD  clopidogrel (PLAVIX) 75 MG tablet Take 1 tablet (75 mg total) by mouth daily with breakfast. Patient not taking: Reported on 03/21/2017 07/07/16   Adrian Prows, MD  diclofenac (VOLTAREN) 50 MG EC tablet Take 1 tablet (50 mg total) by mouth 2 (two) times daily. Patient not taking: Reported on 03/21/2017 08/21/16   Ashley Murrain, NP  diphenhydrAMINE (BENADRYL) 25 MG tablet Take 1 tablet (25 mg total) by mouth every 8 (eight) hours as needed for itching. Patient not taking: Reported on 03/21/2017 02/27/15   Sciacca, Mable Fill, PA-C  hydrochlorothiazide (HYDRODIURIL) 25 MG tablet Take 25 mg by mouth daily.    [provider]  HYDROcodone-acetaminophen (NORCO) 5-325 MG tablet Take 1 tablet by mouth every 6 (six) hours as needed. Patient not taking: Reported on 03/21/2017 08/21/16   Ashley Murrain, NP  indomethacin (INDOCIN) 50 MG capsule Take 1 capsule (50 mg total) by mouth 2 (two) times daily with a meal. Patient not taking: Reported on 03/21/2017 07/07/16   Neldon Labella, NP  methocarbamol (ROBAXIN) 500 MG tablet Take 1 tablet (500 mg total) by mouth 2 (two) times daily. Patient not taking: Reported on 03/21/2017  02/03/16   Rutland Lions, PA-C  metoprolol succinate (TOPROL XL) 25 MG 24 hr tablet Take 1 tablet (25 mg total) by mouth daily. Patient not taking: Reported on 03/21/2017 07/07/16   Adrian Prows, MD  nitroGLYCERIN (NITROSTAT) 0.4 MG SL tablet Place 1 tablet (0.4 mg total) under the tongue every 5 (five) minutes x 3 doses as needed for chest pain. Patient not taking: Reported on 03/21/2017 07/07/16   Adrian Prows, MD  omeprazole (PRILOSEC) 20 MG capsule Take 20 mg by mouth daily.     [provider]    Family History Family History  Problem Relation Age of Onset  . Breast cancer Unknown   . Lung cancer Unknown   . Congestive Heart  Failure Unknown   . Diabetes Unknown   . Hypertension Unknown     Social History Social History  Substance Use Topics  . Smoking status: Current Some Day Smoker    Packs/day: 0.25    Years: 35.00    Types: Cigarettes  . Smokeless tobacco: Never Used  . Alcohol use 8.4 oz/week    14 Cans of beer per week     Allergies   Aspirin and Food   Review of Systems Review of Systems  Constitutional: Negative for chills and fever.  HENT: Negative for ear pain and sore throat.   Eyes: Negative for pain and visual disturbance.  Respiratory: Negative for cough and shortness of breath.   Cardiovascular: Negative for chest pain and palpitations.  Gastrointestinal: Positive for blood in stool and hematochezia. Negative for abdominal pain and vomiting.  Genitourinary: Positive for flank pain (L flank pain). Negative for dysuria and hematuria.  Musculoskeletal: Negative for arthralgias and back pain.  Skin: Negative for color change and rash.  Neurological: Negative for seizures and syncope.  All other systems reviewed and are negative.    Physical Exam Updated Vital Signs BP (!) 147/98 (BP Location: Left Arm)   Pulse 77   Temp 97.9 F (36.6 C) (Oral)   Resp 12   Ht 5' 9"  (1.753 m)   Wt 82.6 kg (182 lb)   SpO2 98%   BMI 26.88 kg/m   Physical Exam  Constitutional: She appears well-developed and well-nourished. No distress.  HENT:  Head: Normocephalic and atraumatic.  Eyes: Conjunctivae are normal.  Neck: Neck supple.  Cardiovascular: Normal rate and regular rhythm.   No murmur heard. Pulmonary/Chest: Effort normal and breath sounds normal. No respiratory distress.  Abdominal: Soft. There is no tenderness.  Genitourinary: Rectal exam shows external hemorrhoid (nonbleeding), tenderness and guaiac positive stool. Rectal exam shows no internal hemorrhoid, no fissure and anal tone normal.  Musculoskeletal: She exhibits no edema.  Neurological: She is alert. No cranial nerve  deficit. Coordination normal.  5/5 motor strength and intact sensation in all extremities. Finger-to-nose intact bilaterally  Skin: Skin is warm and dry.  Nursing note and vitals reviewed.    ED Treatments / Results  Labs (all labs ordered are listed, but only abnormal results are displayed) Labs Reviewed  CBC WITH DIFFERENTIAL/PLATELET  COMPREHENSIVE METABOLIC PANEL  POC OCCULT BLOOD, ED    EKG  EKG Interpretation None       Radiology No results found.  Procedures Procedures (including critical care time)  Medications Ordered in ED Medications - No data to display   Initial Impression / Assessment and Plan / ED Course  I have reviewed the triage vital signs and the nursing notes.  Pertinent labs & imaging results that were available  during my care of the patient were reviewed by me and considered in my medical decision making (see chart for details).     51 year old female history of hypertension, schizophrenia, bipolar, gastric ulcer who presents with bright red blood per rectum.  She reports 6 days of intermittent episodes of dark red blood intermixed with stool.  Today, she went to stool and had pure dark red blood come out.  She denies any presyncopal symptoms.  No hematemesis or melena.  She has history of hemorrhoids.  She does not take any anticoagulation.  She last had a colonoscopy several years ago that showed several polyps.  She also endorses left flank pain.  Denies dysuria, previous kidney stones or other sources of left flank pain.  Denies vaginal bleeding or discharge.  Afebrile, VSS.  Lungs good auscultation bilaterally.  Abdomen soft benign throughout.  Minimal left flank and rib TTP.  Rectal exam showing brown stool in vault without gross blood or active bleeding.  Several hemostatic external hemorrhoids noted.  No obvious anal fissure.  No palpable internal hemorrhoids.  Hemoccult positive.  CBC showing Hgb 12.3 at baseline.  CMP showing mild CRT  elevation to 1.12 from baseline 0.9.  UA without evidence of UTI.  Patient states she has had prior tubal ligation.  Unclear etiology for bright red blood per rectum.  Sources include diverticulosis, hemorrhoid bleeding, anal fissure, or more proximal GI bleeding source.  Her hemoglobin is at baseline despite 6 days of intermittent bleeding episodes.  No evidence of tachycardia or hypotension.  She is stable for continued GI follow-up and outpatient workup.   Return precautions provided for worsening symptoms. Pt will f/u with PCP and GI at first availability. Pt verbalized agreement with plan.  Pt care d/w Dr. Reather Converse  Final Clinical Impressions(s) / ED Diagnoses   Final diagnoses:  None    New Prescriptions New Prescriptions   No medications on file     Payton Emerald, MD 04/14/17 4680    Elnora Morrison, MD 04/15/17 1615

## 2017-04-13 NOTE — ED Triage Notes (Signed)
Pt reports Monday she had 3 stools with bright red blood, small amount. Pt had 1 episode on Tuesday of blood with stool. Pt reports today had 1 time she thought she was going to have a BM and had dark blood come out. Pt reports she does have hemorrhoids and has experienced rectal bleeding in the past. Denies urinary or vaginal symptoms. Pt reports left flank swelling with pain. Slightly tender to palpation.

## 2017-05-21 ENCOUNTER — Emergency Department (HOSPITAL_COMMUNITY): Payer: Self-pay

## 2017-05-21 ENCOUNTER — Emergency Department (HOSPITAL_COMMUNITY)
Admission: EM | Admit: 2017-05-21 | Discharge: 2017-05-21 | Disposition: A | Discharge status: Home / Self Care | Payer: Self-pay | Attending: Emergency Medicine | Admitting: Emergency Medicine

## 2017-05-21 ENCOUNTER — Encounter (HOSPITAL_COMMUNITY): Payer: Self-pay | Admitting: Emergency Medicine

## 2017-05-21 DIAGNOSIS — Z79899 Other long term (current) drug therapy: Secondary | ICD-10-CM | POA: Insufficient documentation

## 2017-05-21 DIAGNOSIS — F17210 Nicotine dependence, cigarettes, uncomplicated: Secondary | ICD-10-CM | POA: Insufficient documentation

## 2017-05-21 DIAGNOSIS — R51 Headache: Secondary | ICD-10-CM | POA: Insufficient documentation

## 2017-05-21 DIAGNOSIS — I10 Essential (primary) hypertension: Secondary | ICD-10-CM | POA: Insufficient documentation

## 2017-05-21 DIAGNOSIS — J45909 Unspecified asthma, uncomplicated: Secondary | ICD-10-CM | POA: Insufficient documentation

## 2017-05-21 DIAGNOSIS — Z7902 Long term (current) use of antithrombotics/antiplatelets: Secondary | ICD-10-CM | POA: Insufficient documentation

## 2017-05-21 DIAGNOSIS — R531 Weakness: Secondary | ICD-10-CM | POA: Insufficient documentation

## 2017-05-21 DIAGNOSIS — K429 Umbilical hernia without obstruction or gangrene: Secondary | ICD-10-CM | POA: Insufficient documentation

## 2017-05-21 LAB — URINALYSIS, ROUTINE W REFLEX MICROSCOPIC
Bilirubin Urine: NEGATIVE
Glucose, UA: NEGATIVE mg/dL
Ketones, ur: NEGATIVE mg/dL
Nitrite: NEGATIVE
PROTEIN: 30 mg/dL — AB
SPECIFIC GRAVITY, URINE: 1.017 (ref 1.005–1.030)
pH: 5 (ref 5.0–8.0)

## 2017-05-21 LAB — BASIC METABOLIC PANEL
Anion gap: 11 (ref 5–15)
BUN: 12 mg/dL (ref 6–20)
CO2: 20 mmol/L — ABNORMAL LOW (ref 22–32)
Calcium: 9.6 mg/dL (ref 8.9–10.3)
Chloride: 106 mmol/L (ref 101–111)
Creatinine, Ser: 0.82 mg/dL (ref 0.44–1.00)
GFR calc Af Amer: >60 mL/min (ref >60)
GFR calc non Af Amer: >60 mL/min (ref >60)
GLUCOSE: 83 mg/dL (ref 65–99)
POTASSIUM: 3.7 mmol/L (ref 3.5–5.1)
Sodium: 137 mmol/L (ref 135–145)

## 2017-05-21 LAB — CBC
HCT: 40.2 % (ref 36.0–46.0)
HEMOGLOBIN: 12.8 g/dL (ref 12.0–15.0)
MCH: 28.6 pg (ref 26.0–34.0)
MCHC: 31.8 g/dL (ref 30.0–36.0)
MCV: 89.7 fL (ref 78.0–100.0)
Platelets: 288 10*3/uL (ref 150–400)
RBC: 4.48 MIL/uL (ref 3.87–5.11)
RDW: 14.7 % (ref 11.5–15.5)
WBC: 7.8 10*3/uL (ref 4.0–10.5)

## 2017-05-21 LAB — POC OCCULT BLOOD, ED: Fecal Occult Bld: NEGATIVE

## 2017-05-21 IMAGING — CT CT ABD-PELV W/ CM
2 of 6 series · 16 of 46 positions shown, 18 images · IV contrast (iopamidol)
Comparison: CT scan dated [DATE]

CLINICAL DATA: Tender umbilical hernia.  Nausea.

EXAM:
CT ABDOMEN AND PELVIS WITH CONTRAST
TECHNIQUE: Multidetector CT imaging of the abdomen and pelvis was performed
using the standard protocol following bolus administration of
intravenous contrast.
CONTRAST:  100mL [7U] IOPAMIDOL ([7U]) INJECTION 61%

[Series 6: abd/ pelvis 5.0 i30f 2 · axial · 0.83mm/px · z∈[+678,+1103]mm · 13 of 96 slices shown, 15 images]
[im 6/96  soft-tissue]
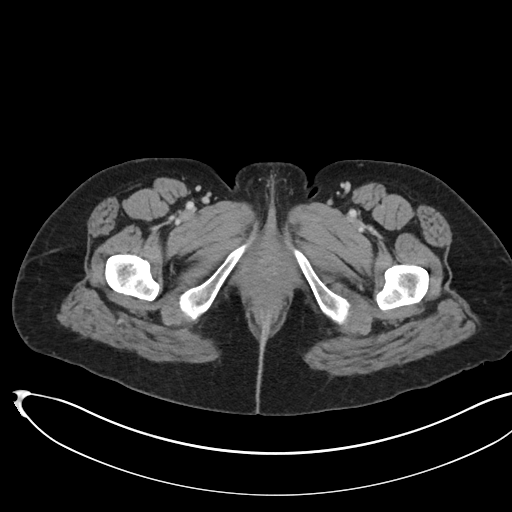
[im 6/96  bone]
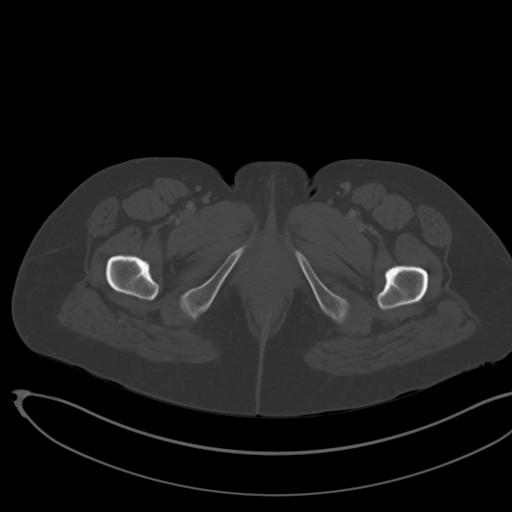
[im 16/96  soft-tissue]
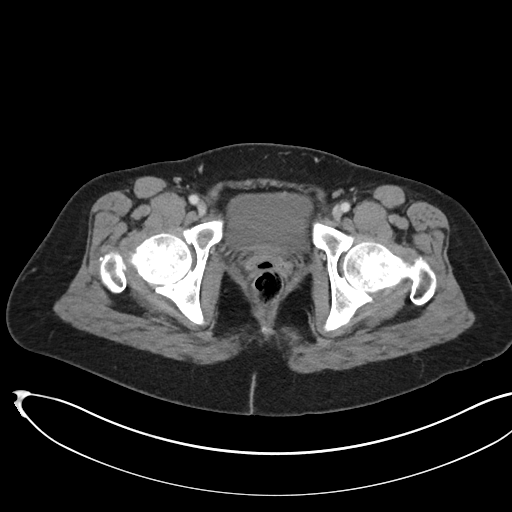
[im 21/96  soft-tissue]
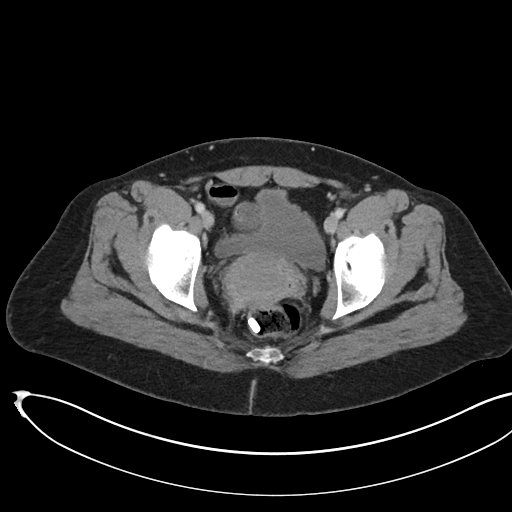
[im 26/96  soft-tissue]
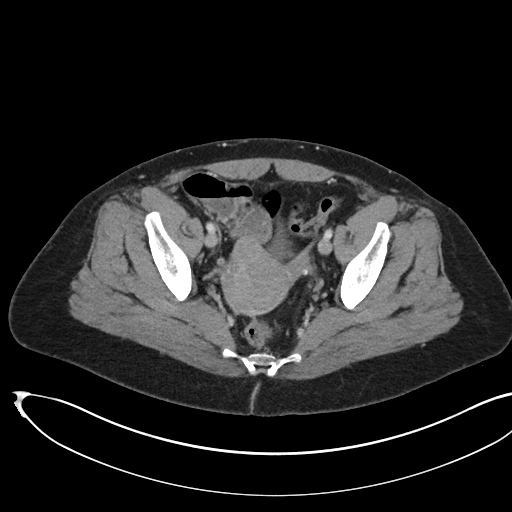
[im 36/96  soft-tissue]
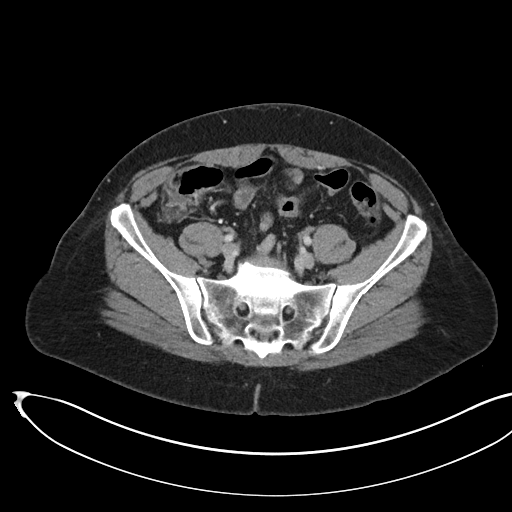
[im 41/96  soft-tissue]
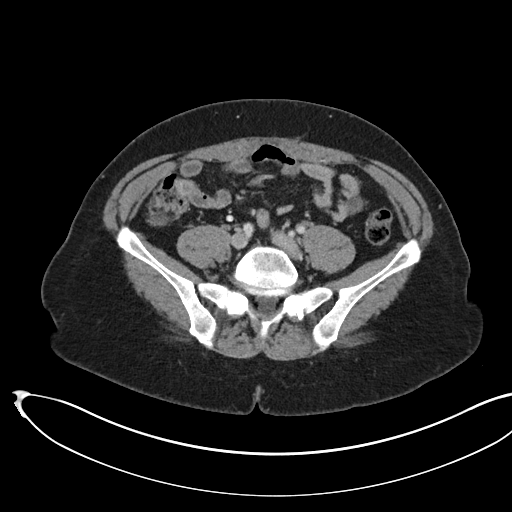
[im 51/96  soft-tissue]
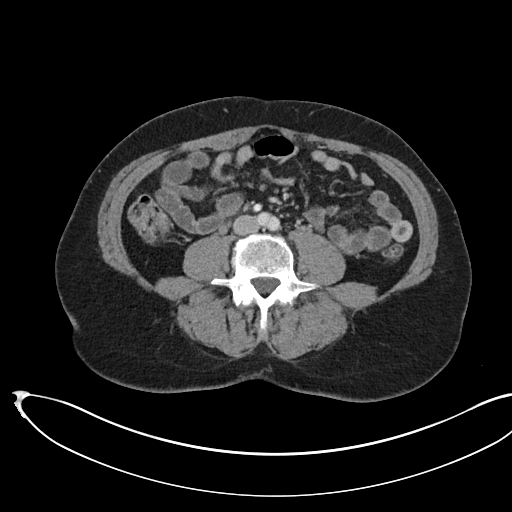
[im 56/96  soft-tissue]
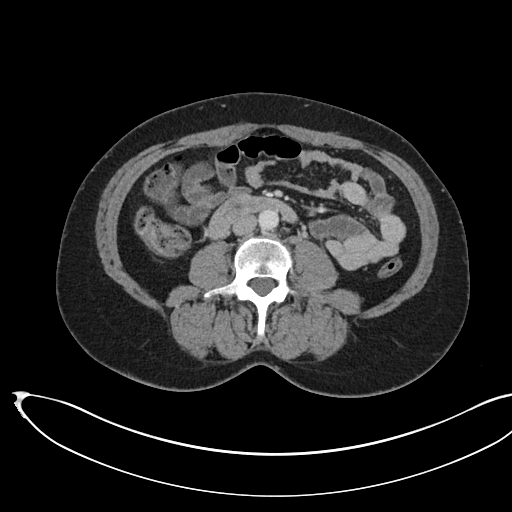
[im 61/96  soft-tissue]
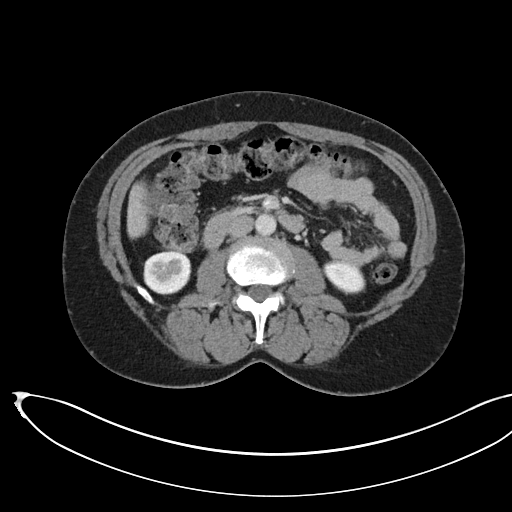
[im 61/96  bone]
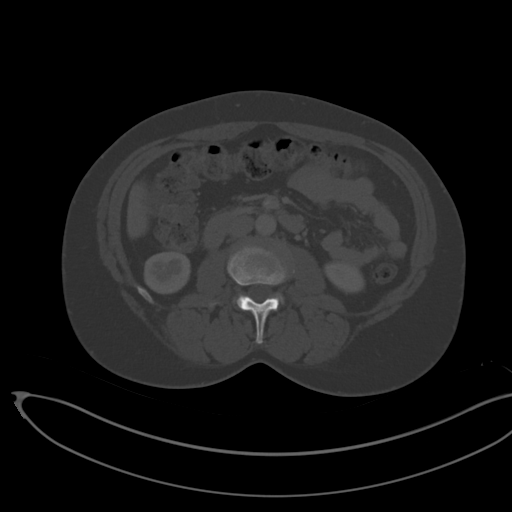
[im 71/96  soft-tissue]
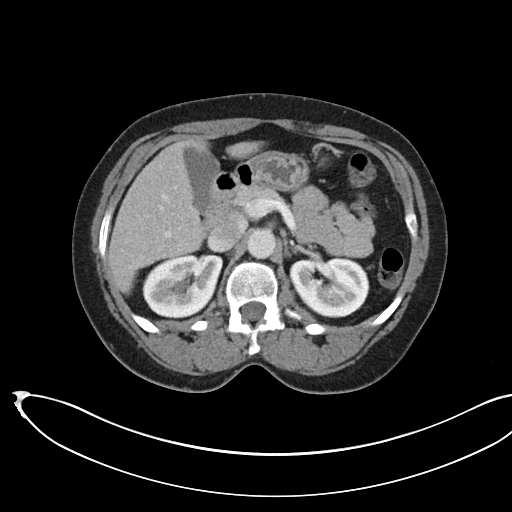
[im 76/96  soft-tissue]
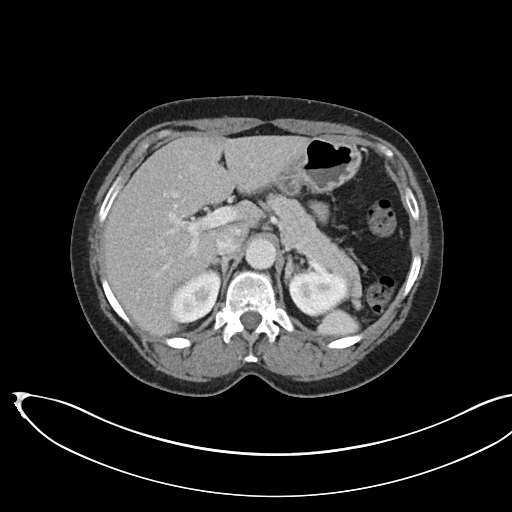
[im 81/96  soft-tissue]
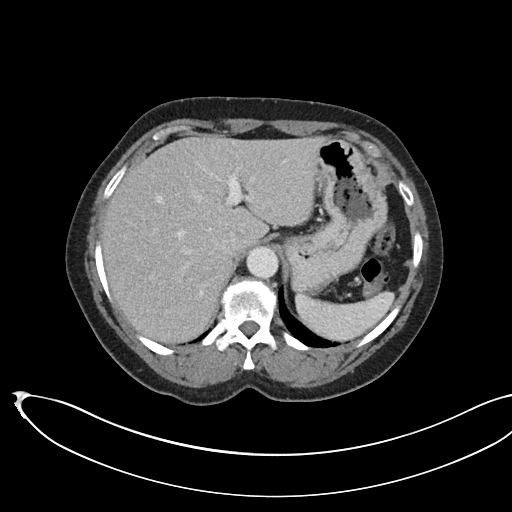
[im 91/96  soft-tissue]
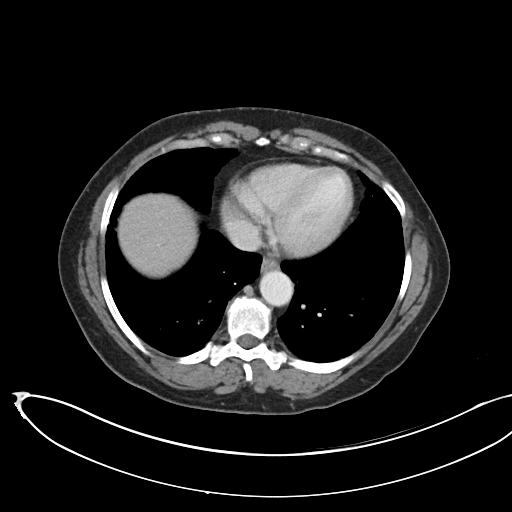

[Series 9: coronal soft tissue · coronal · 0.80mm/px · 3 of 101 slices shown]
[im 34/101  soft-tissue]
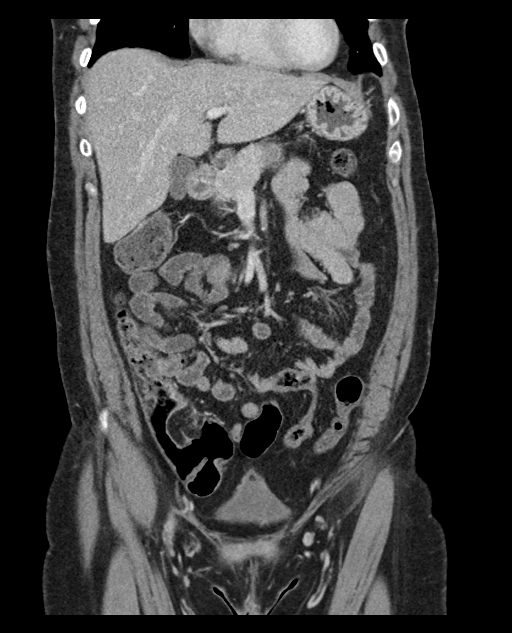
[im 45/101  soft-tissue]
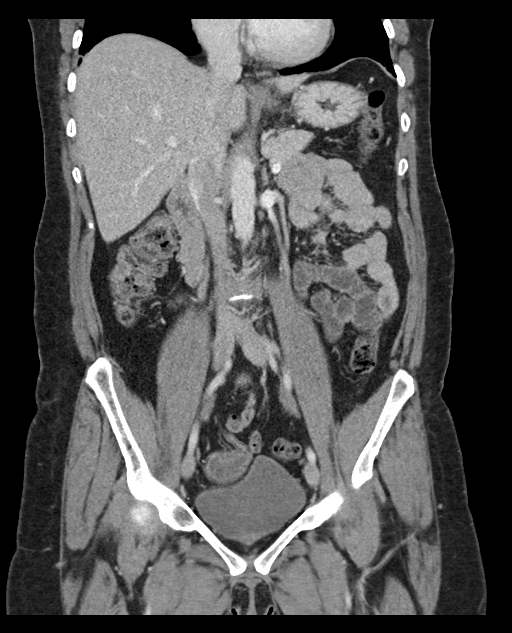
[im 56/101  soft-tissue]
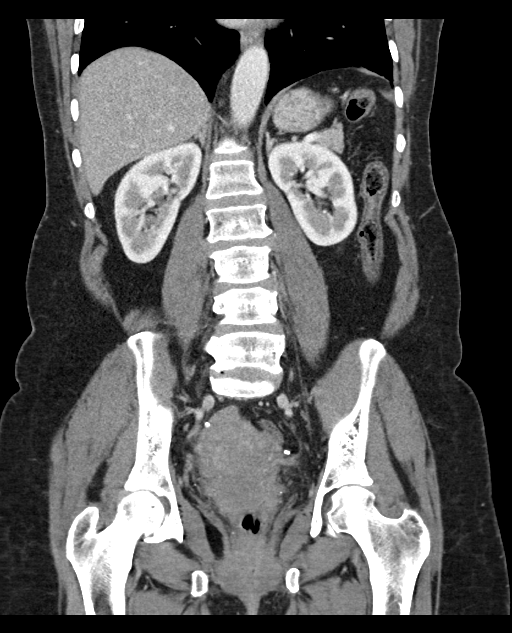

[16 of 46 positions shown; findings below may reference images not displayed]

FINDINGS: Lower chest: Normal.

Hepatobiliary: No focal liver abnormality is seen. No gallstones,
gallbladder wall thickening, or biliary dilatation.

Pancreas: Unremarkable. No pancreatic ductal dilatation or
surrounding inflammatory changes.

Spleen: Normal in size without focal abnormality.

Adrenals/Urinary Tract: Adrenal glands are unremarkable. Kidneys are
normal, without renal calculi, focal lesion, or hydronephrosis.
Bladder is unremarkable.

Stomach/Bowel: Stomach is within normal limits. Appendix appears
normal. No evidence of bowel wall thickening, distention, or
inflammatory changes.

Vascular/Lymphatic: No significant vascular findings are present. No
enlarged abdominal or pelvic lymph nodes.

Reproductive: Ovaries are normal. Multiple small uterine fibroids
including a calcified 17 mm fibroid to the left of midline.

Other: There is a tiny umbilical hernia containing only fat,
unchanged since the prior exam. No evidence of inflammation. No
bowel in the hernia.

Musculoskeletal: Moderate arthritis of the right hip.
IMPRESSION: 1. No acute abnormality. Specifically, no evidence of inflammation
or bowel within the tiny umbilical hernia containing only fat.
2. Moderate arthritis of the right hip.
3. Uterine fibroids.

## 2017-05-21 MED ORDER — ACETAMINOPHEN 325 MG PO TABS
650.0000 mg | ORAL_TABLET | Freq: Once | ORAL | Status: AC
  Administered 2017-05-21: 650 mg via ORAL
  Filled 2017-05-21: qty 2

## 2017-05-21 MED ORDER — IOPAMIDOL (ISOVUE-300) INJECTION 61%
INTRAVENOUS | Status: AC
  Administered 2017-05-21: 100 mL
  Filled 2017-05-21: qty 100

## 2017-05-21 MED ORDER — METOCLOPRAMIDE HCL 5 MG/ML IJ SOLN
10.0000 mg | Freq: Once | INTRAMUSCULAR | Status: AC
  Administered 2017-05-21: 10 mg via INTRAVENOUS
  Filled 2017-05-21: qty 2

## 2017-05-21 NOTE — ED Triage Notes (Signed)
H?a and dizzy started this am  Has had some sob  And nausea

## 2017-05-21 NOTE — Discharge Instructions (Signed)
Take Tylenol as needed for pain. Call [facility] surgery if you wish to get your hernia repaired to schedule an office visit. Ask your primary care physician to help you to stop smoking

## 2017-05-21 NOTE — ED Provider Notes (Signed)
[facility] DEPT Provider Note   CSN: [id number] Arrival date & time: 05/21/17  3893     History   Chief Complaint Chief Complaint  Patient presents with  . Headache  . Dizziness    HPI Denise Knight is a 51 y.o. female. Complains of right-sided temporal headache gradual onset 7 AM today. She also complains of pain at her umbilicus since yesterday reports had bowel movement yesterday which was "red". She is presently hungry. She denies any fever. No treatment prior to coming here. She also complains of mild generalized weakness and was short of breath upon walking into the emergency department however denies any shortness of breath presently HPI  Past Medical History:  Diagnosis Date  . Anxiety   . Arthritis    "left knee" (07/05/2016)  . Asthma   . Bipolar 1 disorder ([place])   . Celiac disease    pt denies this hx on 07/05/2016  . Depression   . GERD (gastroesophageal reflux disease)   . Hypertension   . Post traumatic stress disorder   . Schizophrenia ([place])   . Stomach ulcer     Patient Active Problem List   Diagnosis Date Noted  . Chest pain 07/05/2016    Past Surgical History:  Procedure Laterality Date  . CARDIAC CATHETERIZATION  07/05/2016  . CARDIAC CATHETERIZATION N/A 07/05/2016   Procedure: Left Heart Cath and Coronary Angiography;  Surgeon: Adrian Prows, MD;  Location: [facility];  Service: Cardiovascular;  Laterality: N/A;  . CYST EXCISION Right    "wrist"  . DILATION AND CURETTAGE OF UTERUS    . FOOT SURGERY Bilateral    "corns removed"  . TUBAL LIGATION      OB History    No data available       Home Medications    Prior to Admission medications   Medication Sig Start Date End Date Taking? Authorizing Provider  albuterol (PROVENTIL HFA;VENTOLIN HFA) 108 (90 BASE) MCG/ACT inhaler Inhale 2 puffs into the lungs every 6 (six) hours as needed for wheezing or shortness of breath.    [provider]  atorvastatin (LIPITOR) 80 MG  tablet Take 1 tablet (80 mg total) by mouth daily at 6 PM. Patient not taking: Reported on 03/21/2017 07/07/16   Adrian Prows, MD  clopidogrel (PLAVIX) 75 MG tablet Take 1 tablet (75 mg total) by mouth daily with breakfast. Patient not taking: Reported on 03/21/2017 07/07/16   Adrian Prows, MD  cyclobenzaprine (FLEXERIL) 10 MG tablet Take 1 tablet (10 mg total) by mouth 2 (two) times daily as needed for muscle spasms. 04/13/17   Payton Emerald, MD  diclofenac (VOLTAREN) 50 MG EC tablet Take 1 tablet (50 mg total) by mouth 2 (two) times daily. Patient not taking: Reported on 03/21/2017 08/21/16   Ashley Murrain, NP  diphenhydrAMINE (BENADRYL) 25 MG tablet Take 1 tablet (25 mg total) by mouth every 8 (eight) hours as needed for itching. Patient not taking: Reported on 03/21/2017 02/27/15   Sciacca, Mable Fill, PA-C  hydrochlorothiazide (HYDRODIURIL) 25 MG tablet Take 25 mg by mouth daily.    [provider]  HYDROcodone-acetaminophen (NORCO) 5-325 MG tablet Take 1 tablet by mouth every 6 (six) hours as needed. Patient not taking: Reported on 03/21/2017 08/21/16   Ashley Murrain, NP  indomethacin (INDOCIN) 50 MG capsule Take 1 capsule (50 mg total) by mouth 2 (two) times daily with a meal. Patient not taking: Reported on 03/21/2017 07/07/16   Neldon Labella, NP  methocarbamol (ROBAXIN)  500 MG tablet Take 1 tablet (500 mg total) by mouth 2 (two) times daily. Patient not taking: Reported on 03/21/2017 02/03/16   Crane Lions, PA-C  metoprolol succinate (TOPROL XL) 25 MG 24 hr tablet Take 1 tablet (25 mg total) by mouth daily. Patient not taking: Reported on 03/21/2017 07/07/16   Adrian Prows, MD  nitroGLYCERIN (NITROSTAT) 0.4 MG SL tablet Place 1 tablet (0.4 mg total) under the tongue every 5 (five) minutes x 3 doses as needed for chest pain. Patient not taking: Reported on 03/21/2017 07/07/16   Adrian Prows, MD  omeprazole (PRILOSEC) 20 MG capsule Take 20 mg by mouth daily.     [provider]     Family History Family History  Problem Relation Age of Onset  . Breast cancer Unknown   . Lung cancer Unknown   . Congestive Heart Failure Unknown   . Diabetes Unknown   . Hypertension Unknown     Social History Social History  Substance Use Topics  . Smoking status: Current Some Day Smoker    Packs/day: 0.25    Years: 35.00    Types: Cigarettes  . Smokeless tobacco: Never Used  . Alcohol use 8.4 oz/week    14 Cans of beer per week     Allergies   Aspirin and Food   Review of Systems Review of Systems  HENT: Negative.   Respiratory: Negative.   Cardiovascular: Negative.   Gastrointestinal: Positive for abdominal pain.       Red stool yesterday. Pain at umbilicus  Genitourinary:       Postmenopausal  Musculoskeletal: Negative.   Skin: Negative.   Neurological: Positive for weakness and headaches.  Psychiatric/Behavioral: Negative.   All other systems reviewed and are negative.    Physical Exam Updated Vital Signs BP 127/84   Pulse 83   Temp (!) 97.4 F (36.3 C) (Oral)   Resp 20   SpO2 99%   Physical Exam  Constitutional: She is oriented to person, place, and time. She appears well-developed and well-nourished. No distress.  HENT:  Head: Normocephalic and atraumatic.  Eyes: Conjunctivae are normal. Pupils are equal, round, and reactive to light.  Neck: Neck supple. No tracheal deviation present. No thyromegaly present.  Cardiovascular: Normal rate and regular rhythm.   No murmur heard. Pulmonary/Chest: Effort normal and breath sounds normal.  Abdominal: Soft. Bowel sounds are normal. She exhibits no distension. There is no tenderness.  Umbilical hernia which is not warm or red, it is tender. Abdomen otherwise completely nontender  Musculoskeletal: Normal range of motion. She exhibits no edema or tenderness.  Neurological: She is alert and oriented to person, place, and time. She displays normal reflexes. No cranial nerve deficit. She exhibits  normal muscle tone. Coordination normal.  DTR symmetric bilaterally at knee jerk ankle jerk and biceps toes or going bilaterally  Skin: Skin is warm and dry. No rash noted.  Psychiatric: She has a normal mood and affect.  Nursing note and vitals reviewed.    ED Treatments / Results  Labs (all labs ordered are listed, but only abnormal results are displayed) Labs Reviewed  BASIC METABOLIC PANEL - Abnormal; Notable for the following:       Result Value   CO2 20 (*)    All other components within normal limits  CBC  URINALYSIS, ROUTINE W REFLEX MICROSCOPIC  CBG MONITORING, ED  POC OCCULT BLOOD, ED    EKG  EKG Interpretation  Date/Time:  Wednesday May 21 2017 10:08:32 EDT  Ventricular Rate:  85 PR Interval:  162 QRS Duration: 74 QT Interval:  366 QTC Calculation: 435 R Axis:   43 Text Interpretation:  Normal sinus rhythm Normal ECG No significant change since last tracing Confirmed by Orlie Dakin [phone]) on 05/21/2017 5:05:44 PM      Results for orders placed or performed during the hospital encounter of 45/85/92  Basic metabolic panel  Result Value Ref Range   Sodium 137 135 - 145 mmol/L   Potassium 3.7 3.5 - 5.1 mmol/L   Chloride 106 101 - 111 mmol/L   CO2 20 (L) 22 - 32 mmol/L   Glucose, Bld 83 65 - 99 mg/dL   BUN 12 6 - 20 mg/dL   Creatinine, Ser 0.82 0.44 - 1.00 mg/dL   Calcium 9.6 8.9 - 10.3 mg/dL   GFR calc non Af Amer >60 >60 mL/min   GFR calc Af Amer >60 >60 mL/min   Anion gap 11 5 - 15  CBC  Result Value Ref Range   WBC 7.8 4.0 - 10.5 K/uL   RBC 4.48 3.87 - 5.11 MIL/uL   Hemoglobin 12.8 12.0 - 15.0 g/dL   HCT 40.2 36.0 - 46.0 %   MCV 89.7 78.0 - 100.0 fL   MCH 28.6 26.0 - 34.0 pg   MCHC 31.8 30.0 - 36.0 g/dL   RDW 14.7 11.5 - 15.5 %   Platelets 288 150 - 400 K/uL  Urinalysis, Routine w reflex microscopic  Result Value Ref Range   Color, Urine YELLOW YELLOW   APPearance CLOUDY (A) CLEAR   Specific Gravity, Urine 1.017 1.005 - 1.030   pH 5.0  5.0 - 8.0   Glucose, UA NEGATIVE NEGATIVE mg/dL   Hgb urine dipstick SMALL (A) NEGATIVE   Bilirubin Urine NEGATIVE NEGATIVE   Ketones, ur NEGATIVE NEGATIVE mg/dL   Protein, ur 30 (A) NEGATIVE mg/dL   Nitrite NEGATIVE NEGATIVE   Leukocytes, UA SMALL (A) NEGATIVE   RBC / HPF 6-30 0 - 5 RBC/hpf   WBC, UA 6-30 0 - 5 WBC/hpf   Bacteria, UA RARE (A) NONE SEEN   Squamous Epithelial / LPF TOO NUMEROUS TO COUNT (A) NONE SEEN   Mucous PRESENT   POC occult blood, ED Provider will collect  Result Value Ref Range   Fecal Occult Bld NEGATIVE NEGATIVE   Dg Chest 2 View  Result Date: 05/21/2017 CLINICAL DATA:  Shortness of breath and dizziness EXAM: CHEST  2 VIEW COMPARISON:  Mar 21, 2017 FINDINGS: There is no edema or consolidation. The heart size and pulmonary vascularity are normal. No adenopathy. No bone lesions. IMPRESSION: No edema or consolidation. Electronically Signed   By: Lowella Grip III M.D.   On: 05/21/2017 10:34   Ct Abdomen Pelvis W Contrast  Result Date: 05/21/2017 CLINICAL DATA:  Tender umbilical hernia.  Nausea. EXAM: CT ABDOMEN AND PELVIS WITH CONTRAST TECHNIQUE: Multidetector CT imaging of the abdomen and pelvis was performed using the standard protocol following bolus administration of intravenous contrast. CONTRAST:  141m ISOVUE-300 IOPAMIDOL (ISOVUE-300) INJECTION 61% COMPARISON:  CT scan dated 06/21/2015 FINDINGS: Lower chest: Normal. Hepatobiliary: No focal liver abnormality is seen. No gallstones, gallbladder wall thickening, or biliary dilatation. Pancreas: Unremarkable. No pancreatic ductal dilatation or surrounding inflammatory changes. Spleen: Normal in size without focal abnormality. Adrenals/Urinary Tract: Adrenal glands are unremarkable. Kidneys are normal, without renal calculi, focal lesion, or hydronephrosis. Bladder is unremarkable. Stomach/Bowel: Stomach is within normal limits. Appendix appears normal. No evidence of bowel wall thickening, distention, or  inflammatory changes. Vascular/Lymphatic: No significant vascular findings are present. No enlarged abdominal or pelvic lymph nodes. Reproductive: Ovaries are normal. Multiple small uterine fibroids including a calcified 17 mm fibroid to the left of midline. Other: There is a tiny umbilical hernia containing only fat, unchanged since the prior exam. No evidence of inflammation. No bowel in the hernia. Musculoskeletal: Moderate arthritis of the right hip. IMPRESSION: 1. No acute abnormality. Specifically, no evidence of inflammation or bowel within the tiny umbilical hernia containing only fat. 2. Moderate arthritis of the right hip. 3. Uterine fibroids. Electronically Signed   By: Lorriane Shire M.D.   On: 05/21/2017 16:49   Radiology Dg Chest 2 View  Result Date: 05/21/2017 CLINICAL DATA:  Shortness of breath and dizziness EXAM: CHEST  2 VIEW COMPARISON:  Mar 21, 2017 FINDINGS: There is no edema or consolidation. The heart size and pulmonary vascularity are normal. No adenopathy. No bone lesions. IMPRESSION: No edema or consolidation. Electronically Signed   By: Lowella Grip III M.D.   On: 05/21/2017 10:34    Procedures Procedures (including critical care time)  Medications Ordered in ED Medications  metoCLOPramide (REGLAN) injection 10 mg (10 mg Intravenous Given 05/21/17 1323)     Initial Impression / Assessment and Plan / ED Course  I have reviewed the triage vital signs and the nursing notes.  Pertinent labs & imaging results that were available during my care of the patient were reviewed by me and considered in my medical decision making (see chart for details).     5 PM headache much improved after treatment with intravenous Reglan. She reports that her abdomen is "sore," however she feels hungry. Requesting Tylenol for pain. Ordered by me. She'll be given a snack prior to discharge. She is presently alert and ambulates without difficulty appears well. I counseled patient for 5  minutes on smoking cessation. She'll be referred to [facility] surgery for elective hernia repair Final Clinical Impressions(s) / ED Diagnoses  Diagnosis #1 headache #2 umbilical hernia #3 tobacco abuse Final diagnoses:  None   #4 generalized weakness New Prescriptions New Prescriptions   No medications on file     Orlie Dakin, MD 05/21/17 1736

## 2017-05-21 NOTE — ED Notes (Signed)
Pt back from CT. Pt connected to monitor.

## 2017-05-21 NOTE — ED Notes (Signed)
Pt ambulated to restroom to collect urine sample, no issues to report.

## 2017-06-14 ENCOUNTER — Encounter (HOSPITAL_COMMUNITY): Payer: Self-pay | Admitting: Emergency Medicine

## 2017-06-14 ENCOUNTER — Inpatient Hospital Stay (HOSPITAL_COMMUNITY)
Admission: EM | Admit: 2017-06-14 | Discharge: 2017-06-16 | DRG: 202 | Disposition: A | Discharge status: Home / Self Care | Payer: Self-pay | Attending: Internal Medicine | Admitting: Internal Medicine

## 2017-06-14 ENCOUNTER — Emergency Department (HOSPITAL_COMMUNITY): Payer: Self-pay

## 2017-06-14 DIAGNOSIS — J069 Acute upper respiratory infection, unspecified: Secondary | ICD-10-CM | POA: Diagnosis present

## 2017-06-14 DIAGNOSIS — I10 Essential (primary) hypertension: Secondary | ICD-10-CM | POA: Diagnosis present

## 2017-06-14 DIAGNOSIS — J4521 Mild intermittent asthma with (acute) exacerbation: Secondary | ICD-10-CM

## 2017-06-14 DIAGNOSIS — Z79899 Other long term (current) drug therapy: Secondary | ICD-10-CM

## 2017-06-14 DIAGNOSIS — F319 Bipolar disorder, unspecified: Secondary | ICD-10-CM | POA: Diagnosis present

## 2017-06-14 DIAGNOSIS — F4310 Post-traumatic stress disorder, unspecified: Secondary | ICD-10-CM | POA: Diagnosis present

## 2017-06-14 DIAGNOSIS — J208 Acute bronchitis due to other specified organisms: Secondary | ICD-10-CM | POA: Diagnosis present

## 2017-06-14 DIAGNOSIS — F419 Anxiety disorder, unspecified: Secondary | ICD-10-CM | POA: Diagnosis present

## 2017-06-14 DIAGNOSIS — F209 Schizophrenia, unspecified: Secondary | ICD-10-CM | POA: Diagnosis present

## 2017-06-14 DIAGNOSIS — J209 Acute bronchitis, unspecified: Principal | ICD-10-CM | POA: Diagnosis present

## 2017-06-14 DIAGNOSIS — K219 Gastro-esophageal reflux disease without esophagitis: Secondary | ICD-10-CM | POA: Diagnosis present

## 2017-06-14 DIAGNOSIS — F17210 Nicotine dependence, cigarettes, uncomplicated: Secondary | ICD-10-CM | POA: Diagnosis present

## 2017-06-14 DIAGNOSIS — J45901 Unspecified asthma with (acute) exacerbation: Secondary | ICD-10-CM | POA: Diagnosis present

## 2017-06-14 LAB — CBC WITH DIFFERENTIAL/PLATELET
Basophils Absolute: 0.1 10*3/uL (ref 0.0–0.1)
Basophils Relative: 1 %
EOS PCT: 5 %
Eosinophils Absolute: 0.4 10*3/uL (ref 0.0–0.7)
HEMATOCRIT: 35.7 % — AB (ref 36.0–46.0)
HEMOGLOBIN: 11.8 g/dL — AB (ref 12.0–15.0)
LYMPHS ABS: 2.5 10*3/uL (ref 0.7–4.0)
Lymphocytes Relative: 31 %
MCH: 28.8 pg (ref 26.0–34.0)
MCHC: 33.1 g/dL (ref 30.0–36.0)
MCV: 87.1 fL (ref 78.0–100.0)
MONOS PCT: 5 %
Monocytes Absolute: 0.4 10*3/uL (ref 0.1–1.0)
Neutro Abs: 4.7 10*3/uL (ref 1.7–7.7)
Neutrophils Relative %: 58 %
Platelets: 278 10*3/uL (ref 150–400)
RBC: 4.1 MIL/uL (ref 3.87–5.11)
RDW: 14.6 % (ref 11.5–15.5)
WBC: 8.1 10*3/uL (ref 4.0–10.5)

## 2017-06-14 LAB — BASIC METABOLIC PANEL
ANION GAP: 11 (ref 5–15)
BUN: 14 mg/dL (ref 6–20)
CHLORIDE: 107 mmol/L (ref 101–111)
CO2: 24 mmol/L (ref 22–32)
Calcium: 9.6 mg/dL (ref 8.9–10.3)
Creatinine, Ser: 0.91 mg/dL (ref 0.44–1.00)
GFR calc non Af Amer: >60 mL/min (ref >60)
Glucose, Bld: 88 mg/dL (ref 65–99)
POTASSIUM: 4.3 mmol/L (ref 3.5–5.1)
Sodium: 142 mmol/L (ref 135–145)

## 2017-06-14 LAB — D-DIMER, QUANTITATIVE (NOT AT ARMC): D DIMER QUANT: 0.32 ug{FEU}/mL (ref 0.00–0.50)

## 2017-06-14 LAB — I-STAT TROPONIN, ED: Troponin i, poc: 0 ng/mL (ref 0.00–0.08)

## 2017-06-14 IMAGING — DX DG CHEST 1V PORT
1 series · 1 of 1 positions shown · non-contrast
Comparison: [DATE]

CLINICAL DATA: Severe shortness breath and cough since this
morning.

EXAM:
PORTABLE CHEST 1 VIEW

[chest ap]
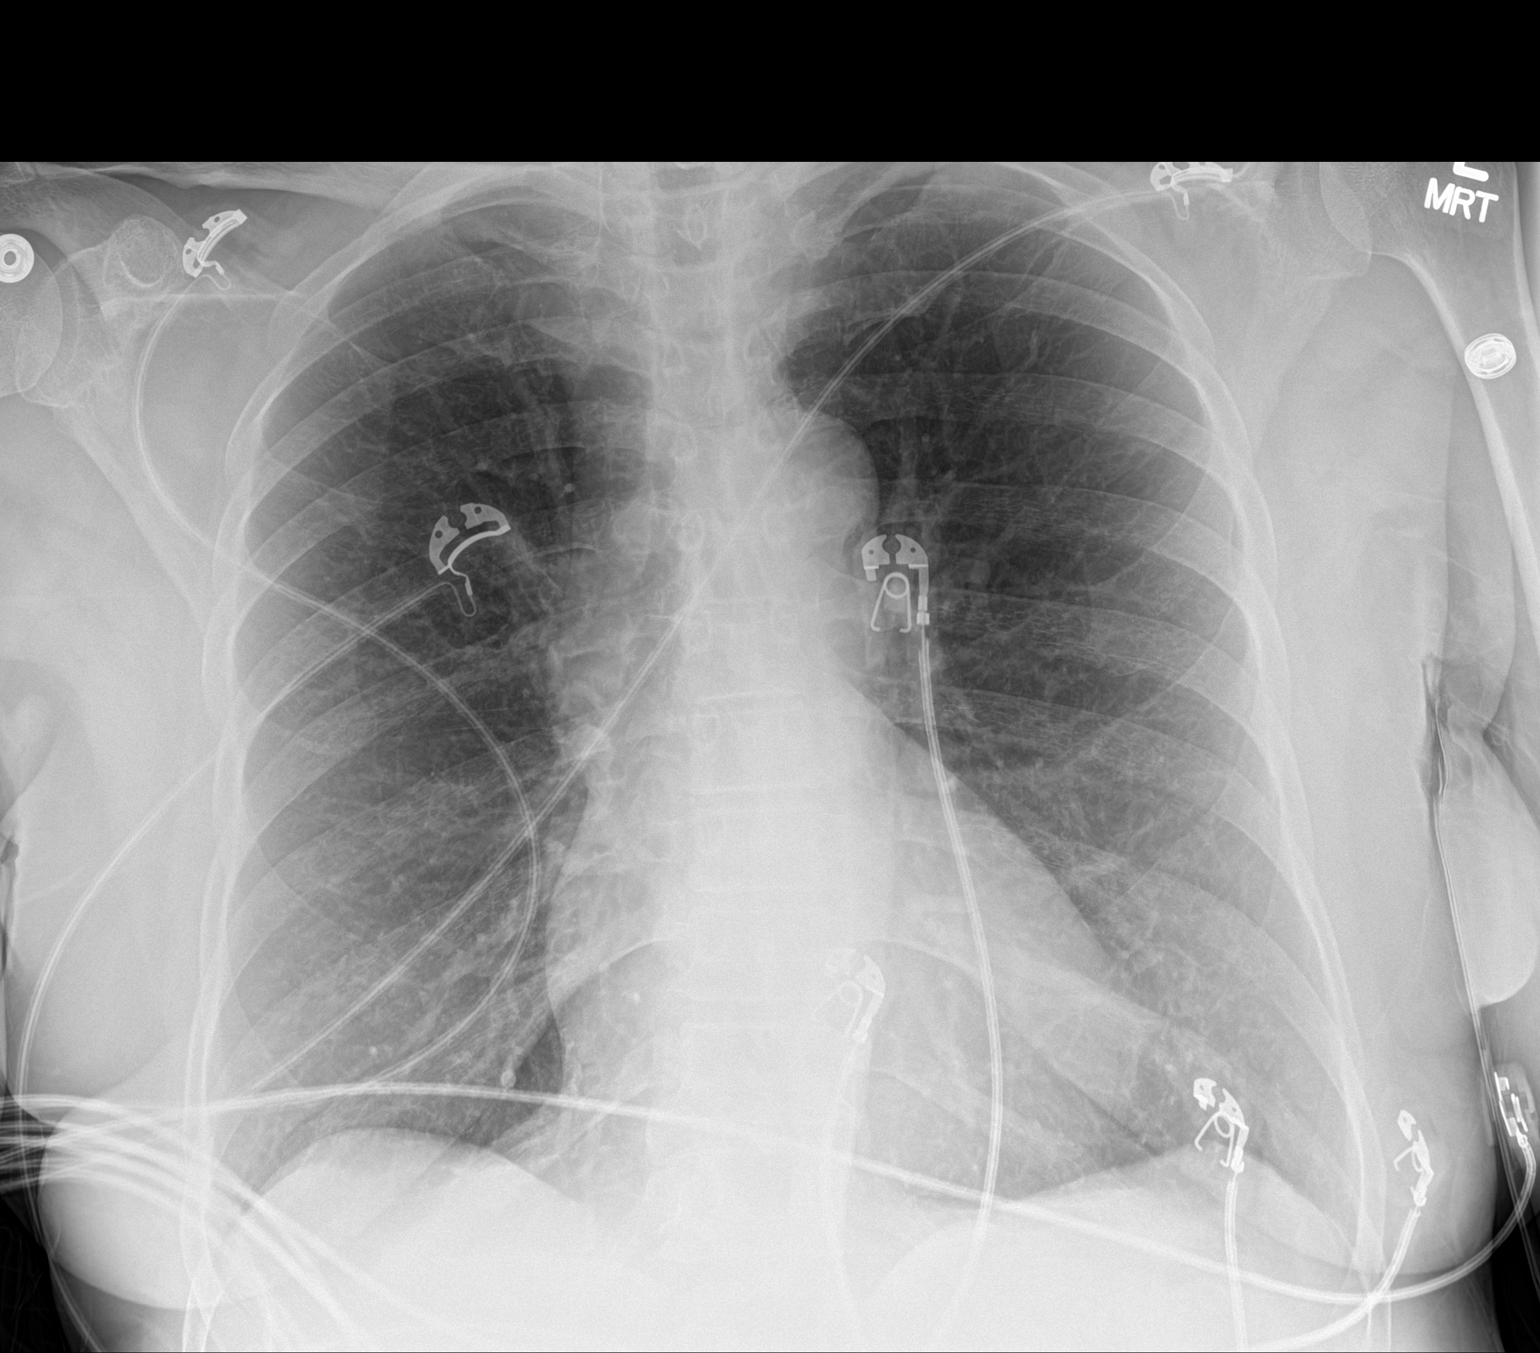

[1 of 1 positions shown; findings below may reference images not displayed]

FINDINGS: Lungs are clear without focal airspace disease or pulmonary edema.
Heart and mediastinum are within normal limits and stable. The
trachea is midline. Negative for a pneumothorax. No acute bone
abnormality.
IMPRESSION: No acute chest findings.

## 2017-06-14 MED ORDER — NON FORMULARY: 5.0000 mg | Freq: Once | Status: DC

## 2017-06-14 MED ORDER — NICOTINE 7 MG/24HR TD PT24
7.0000 mg | MEDICATED_PATCH | Freq: Every day | TRANSDERMAL | Status: DC
  Administered 2017-06-14 – 2017-06-16 (×3): 7 mg via TRANSDERMAL
  Filled 2017-06-14 (×3): qty 1

## 2017-06-14 MED ORDER — ALBUTEROL SULFATE (2.5 MG/3ML) 0.083% IN NEBU: 2.5000 mg | INHALATION_SOLUTION | RESPIRATORY_TRACT | Status: DC | PRN

## 2017-06-14 MED ORDER — ALBUTEROL SULFATE (2.5 MG/3ML) 0.083% IN NEBU
5.0000 mg | INHALATION_SOLUTION | Freq: Once | RESPIRATORY_TRACT | Status: AC
  Administered 2017-06-14: 5 mg via RESPIRATORY_TRACT
  Filled 2017-06-14: qty 6

## 2017-06-14 MED ORDER — IPRATROPIUM-ALBUTEROL 0.5-2.5 (3) MG/3ML IN SOLN
3.0000 mL | RESPIRATORY_TRACT | Status: DC
  Administered 2017-06-14 – 2017-06-15 (×2): 3 mL via RESPIRATORY_TRACT
  Filled 2017-06-14 (×2): qty 3

## 2017-06-14 MED ORDER — PANTOPRAZOLE SODIUM 40 MG PO TBEC
40.0000 mg | DELAYED_RELEASE_TABLET | Freq: Every day | ORAL | Status: DC
  Administered 2017-06-14 – 2017-06-16 (×3): 40 mg via ORAL
  Filled 2017-06-14 (×3): qty 1

## 2017-06-14 MED ORDER — IPRATROPIUM BROMIDE 0.02 % IN SOLN
0.5000 mg | Freq: Once | RESPIRATORY_TRACT | Status: AC
  Administered 2017-06-14: 0.5 mg via RESPIRATORY_TRACT
  Filled 2017-06-14: qty 2.5

## 2017-06-14 MED ORDER — PREDNISONE 20 MG PO TABS
40.0000 mg | ORAL_TABLET | Freq: Every day | ORAL | Status: DC
  Administered 2017-06-15 – 2017-06-16 (×2): 40 mg via ORAL
  Filled 2017-06-14 (×2): qty 2

## 2017-06-14 MED ORDER — SODIUM CHLORIDE 0.9 % IV BOLUS (SEPSIS)
1000.0000 mL | Freq: Once | INTRAVENOUS | Status: AC
  Administered 2017-06-14: 1000 mL via INTRAVENOUS

## 2017-06-14 MED ORDER — METHYLPREDNISOLONE SODIUM SUCC 125 MG IJ SOLR
125.0000 mg | Freq: Once | INTRAMUSCULAR | Status: AC
  Administered 2017-06-14: 125 mg via INTRAVENOUS
  Filled 2017-06-14: qty 2

## 2017-06-14 MED ORDER — MELATONIN 3 MG PO TABS
3.0000 mg | ORAL_TABLET | Freq: Once | ORAL | Status: AC
  Administered 2017-06-14: 3 mg via ORAL
  Filled 2017-06-14: qty 1

## 2017-06-14 MED ORDER — BENZONATATE 100 MG PO CAPS
100.0000 mg | ORAL_CAPSULE | Freq: Three times a day (TID) | ORAL | Status: DC | PRN
  Administered 2017-06-14 – 2017-06-15 (×2): 100 mg via ORAL
  Filled 2017-06-14 (×2): qty 1

## 2017-06-14 MED ORDER — HYDROCHLOROTHIAZIDE 25 MG PO TABS
25.0000 mg | ORAL_TABLET | Freq: Every day | ORAL | Status: DC
  Administered 2017-06-14 – 2017-06-16 (×3): 25 mg via ORAL
  Filled 2017-06-14 (×3): qty 1

## 2017-06-14 NOTE — ED Provider Notes (Signed)
[facility] DEPT Provider Note   CSN: [id number] Arrival date & time: 06/14/17  8366     History   Chief Complaint Chief Complaint  Patient presents with  . Asthma  . Cough    HPI Denise Knight is a 51 y.o. female hx asthma, Smoker, hypertension here presenting with shortness of breath, cough, chills. 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 This morning she was working and she had some chills and some cough that is nonproductive. She has some shortness of breath as well. She states that this is similar to her previous asthma exacerbation. She still smokes but does not smoke every day. Denies any recent travel or history of blood clots or leg swelling.  The history is provided by the patient.    Past Medical History:  Diagnosis Date  . Anxiety   . Arthritis    "left knee" (07/05/2016)  . Asthma   . Bipolar 1 disorder ([place])   . Celiac disease    pt denies this hx on 07/05/2016  . Depression   . GERD (gastroesophageal reflux disease)   . Hypertension   . Post traumatic stress disorder   . Schizophrenia ([place])   . Stomach ulcer     Patient Active Problem List   Diagnosis Date Noted  . Chest pain 07/05/2016    Past Surgical History:  Procedure Laterality Date  . CARDIAC CATHETERIZATION  07/05/2016  . CARDIAC CATHETERIZATION N/A 07/05/2016   Procedure: Left Heart Cath and Coronary Angiography;  Surgeon: Adrian Prows, MD;  Location: [facility];  Service: Cardiovascular;  Laterality: N/A;  . CYST EXCISION Right    "wrist"  . DILATION AND CURETTAGE OF UTERUS    . FOOT SURGERY Bilateral    "corns removed"  . TUBAL LIGATION      OB History    No data available       Home Medications    Prior to Admission medications   Medication Sig Start Date End Date Taking? Authorizing Provider  hydrochlorothiazide (HYDRODIURIL) 25 MG tablet Take 25 mg by mouth daily.   Yes [provider]  omeprazole (PRILOSEC) 20 MG capsule Take 20 mg by mouth daily.     Yes [provider]  vitamin C (ASCORBIC ACID) 500 MG tablet Take 500 mg by mouth daily.   Yes [provider]  albuterol (PROVENTIL HFA;VENTOLIN HFA) 108 (90 BASE) MCG/ACT inhaler Inhale 2 puffs into the lungs every 6 (six) hours as needed for wheezing or shortness of breath.    [provider]  atorvastatin (LIPITOR) 80 MG tablet Take 1 tablet (80 mg total) by mouth daily at 6 PM. Patient not taking: Reported on 03/21/2017 07/07/16   Adrian Prows, MD  clopidogrel (PLAVIX) 75 MG tablet Take 1 tablet (75 mg total) by mouth daily with breakfast. Patient not taking: Reported on 03/21/2017 07/07/16   Adrian Prows, MD  cyclobenzaprine (FLEXERIL) 10 MG tablet Take 1 tablet (10 mg total) by mouth 2 (two) times daily as needed for muscle spasms. Patient not taking: Reported on 06/14/2017 04/13/17   Payton Emerald, MD  diclofenac (VOLTAREN) 50 MG EC tablet Take 1 tablet (50 mg total) by mouth 2 (two) times daily. Patient not taking: Reported on 03/21/2017 08/21/16   Ashley Murrain, NP  diphenhydrAMINE (BENADRYL) 25 MG tablet Take 1 tablet (25 mg total) by mouth every 8 (eight) hours as needed for itching. Patient not taking: Reported on 03/21/2017 02/27/15   Jamse Mead, PA-C  HYDROcodone-acetaminophen (  NORCO) 5-325 MG tablet Take 1 tablet by mouth every 6 (six) hours as needed. Patient not taking: Reported on 03/21/2017 08/21/16   Ashley Murrain, NP  indomethacin (INDOCIN) 50 MG capsule Take 1 capsule (50 mg total) by mouth 2 (two) times daily with a meal. Patient not taking: Reported on 03/21/2017 07/07/16   Neldon Labella, NP  methocarbamol (ROBAXIN) 500 MG tablet Take 1 tablet (500 mg total) by mouth 2 (two) times daily. Patient not taking: Reported on 03/21/2017 02/03/16   [place] Lions, PA-C  metoprolol succinate (TOPROL XL) 25 MG 24 hr tablet Take 1 tablet (25 mg total) by mouth daily. Patient not taking: Reported on 03/21/2017 07/07/16   Adrian Prows, MD  nitroGLYCERIN (NITROSTAT)  0.4 MG SL tablet Place 1 tablet (0.4 mg total) under the tongue every 5 (five) minutes x 3 doses as needed for chest pain. Patient not taking: Reported on 03/21/2017 07/07/16   Adrian Prows, MD    Family History Family History  Problem Relation Age of Onset  . Breast cancer Unknown   . Lung cancer Unknown   . Congestive Heart Failure Unknown   . Diabetes Unknown   . Hypertension Unknown     Social History Social History  Substance Use Topics  . Smoking status: Current Some Day Smoker    Packs/day: 0.25    Years: 35.00    Types: Cigarettes  . Smokeless tobacco: Never Used  . Alcohol use 8.4 oz/week    14 Cans of beer per week     Allergies   Aspirin and Food   Review of Systems Review of Systems  Respiratory: Positive for cough.   All other systems reviewed and are negative.    Physical Exam Updated Vital Signs BP 126/73   Pulse (!) 117   Temp 98 F (36.7 C) (Oral)   Resp (!) 32   Ht 5' 9"  (1.753 m)   Wt 79.8 kg (176 lb)   SpO2 98%   BMI 25.99 kg/m   Physical Exam  Constitutional: She is oriented to person, place, and time.  Uncomfortable, tachypneic   HENT:  Head: Normocephalic.  Mouth/Throat: Oropharynx is clear and moist.  Eyes: Pupils are equal, round, and reactive to light. Conjunctivae and EOM are normal.  Neck: Normal range of motion. Neck supple.  Cardiovascular: Normal rate, regular rhythm and normal heart sounds.   Pulmonary/Chest:  Tachypneic, diminished bilaterally, poor air movement. Mild wheezing throughout. Mild retractions   Abdominal: Soft. Bowel sounds are normal. She exhibits no distension. There is no tenderness.  Musculoskeletal: Normal range of motion.  Neurological: She is alert and oriented to person, place, and time. No cranial nerve deficit. Coordination normal.  Skin: Skin is warm.  Psychiatric: She has a normal mood and affect. Her behavior is normal.  Nursing note and vitals reviewed.    ED Treatments / Results   Labs (all labs ordered are listed, but only abnormal results are displayed) Labs Reviewed  CBC WITH DIFFERENTIAL/PLATELET - Abnormal; Notable for the following:       Result Value   Hemoglobin 11.8 (*)    HCT 35.7 (*)    All other components within normal limits  BASIC METABOLIC PANEL  D-DIMER, QUANTITATIVE (NOT AT [facility])  I-STAT TROPONIN, ED    EKG  EKG Interpretation  Date/Time:  Saturday June 14 2017 10:14:20 EDT Ventricular Rate:  88 PR Interval:    QRS Duration: 84 QT Interval:  355 QTC Calculation: 430 R Axis:  46 Text Interpretation:  Sinus rhythm Borderline low voltage, extremity leads Baseline wander in lead(s) I II III aVR aVF V1 V2 V5 No significant change since last tracing Confirmed by Wandra Arthurs ([id number]) on 06/14/2017 10:31:12 AM       Radiology Dg Chest Port 1 View  Result Date: 06/14/2017 CLINICAL DATA:  Severe shortness breath and cough since this morning. EXAM: PORTABLE CHEST 1 VIEW COMPARISON:  05/21/2017 FINDINGS: Lungs are clear without focal airspace disease or pulmonary edema. Heart and mediastinum are within normal limits and stable. The trachea is midline. Negative for a pneumothorax. No acute bone abnormality. IMPRESSION: No acute chest findings. Electronically Signed   By: Markus Daft M.D.   On: 06/14/2017 10:33    Procedures Procedures (including critical care time)  Medications Ordered in ED Medications  albuterol (PROVENTIL) (2.5 MG/3ML) 0.083% nebulizer solution 5 mg (5 mg Nebulization Given 06/14/17 1020)  ipratropium (ATROVENT) nebulizer solution 0.5 mg (0.5 mg Nebulization Given 06/14/17 1020)  methylPREDNISolone sodium succinate (SOLU-MEDROL) 125 mg/2 mL injection 125 mg (125 mg Intravenous Given 06/14/17 1046)  sodium chloride 0.9 % bolus 1,000 mL (0 mLs Intravenous Stopped 06/14/17 1208)  albuterol (PROVENTIL) (2.5 MG/3ML) 0.083% nebulizer solution 5 mg (5 mg Nebulization Given 06/14/17 1206)  ipratropium (ATROVENT) nebulizer solution 0.5 mg  (0.5 mg Nebulization Given 06/14/17 1206)  sodium chloride 0.9 % bolus 1,000 mL (0 mLs Intravenous Stopped 06/14/17 1503)  albuterol (PROVENTIL) (2.5 MG/3ML) 0.083% nebulizer solution 5 mg (5 mg Nebulization Given 06/14/17 1319)     Initial Impression / Assessment and Plan / ED Course  I have reviewed the triage vital signs and the nursing notes.  Pertinent labs & imaging results that were available during my care of the patient were reviewed by me and considered in my medical decision making (see chart for details).     Denise Knight is a 51 y.o. female here with cough, wheezing. Likely asthma exacerbation vs pneumonia. Will get labs, CXR. Will give nebs, steroids. I doubt ACS or PE.   1 pm Patient became more tachycardic and short of breath after nebs. Will get d-dimer. Will give another NS bolus. Also had an episode of blood in stool. On exam, small external hemorrhoid with no active bleeding.   3:13 PM Still tachypneic. Still wheezing and poor air movement. D-dimer neg. Will admit for observation for asthma exacerbation.   Final Clinical Impressions(s) / ED Diagnoses   Final diagnoses:  None    New Prescriptions New Prescriptions   No medications on file     Drenda Freeze, MD 06/14/17 1515

## 2017-06-14 NOTE — H&P (Signed)
Date: 06/14/2017               Patient Name:  Denise Knight MRN: [id number]  DOB: Mar 29, 1966 Age / Sex: 51 y.o., female   PCP: [facility]         Medical Service: Internal Medicine Teaching Service         Attending Physician: Dr. Oval Linsey, MD    First Contact: Dr. Thomasene Ripple Pager: [phone]  Second Contact: Dr. Maryellen Pile Pager: [phone]       After Hours (After 5p/  First Contact Pager: [phone]  weekends / holidays): Second Contact Pager: [phone]   Chief Complaint: Asthma exacerbation  History of Present Illness: Denise Knight is a 51 yo female with a PMH of asthma, allergies, and HTN who presented to the ED with shortness of breath. She states that she started to feel not herself yesterday and all day today. She has been coughing a lot for the past 48 hours and has felt worsening chest tightness along with shortness of breath whenever she coughs. She also states that she has been having the chills for the past 48 hours. She had two episodes of emesis which she thinks occurred because she has been coughing so much. She denies congestion, itchy eyes, and rhinorrhea. She usually gets allergies this time of year but has not taken any medication for this recently. She did start taking Flonase, which she said she started using because her daughter complained of her snoring too loudly at night.   The patient also complains that she has been urinating more frequently recently and drinking a lot of fluids, like orange juice. She feels that she is always thirsty recently and doesn't know why.  She has a history of asthma but rarely uses her inhaler and take no medication for this disease at baseline. She said she the last time she used her inhaler was in March. Her usual asthma triggers include seasonal allergies and humidity, so she usually just avoids going outside if it is too hot. She was hospitalized in the past for an asthma exacerbation but  this was many years ago. She has never been intubated for asthma.   Meds:  Current Meds  Medication Sig  . hydrochlorothiazide (HYDRODIURIL) 25 MG tablet Take 25 mg by mouth daily.  Marland Kitchen omeprazole (PRILOSEC) 20 MG capsule Take 20 mg by mouth daily.   . vitamin C (ASCORBIC ACID) 500 MG tablet Take 500 mg by mouth daily.   Allergies: Allergies as of 06/14/2017 - Review Complete 06/14/2017  Allergen Reaction Noted  . Aspirin Hives 03/17/2014  . Food Hives 06/21/2015   Past Medical History: Past Medical History:  Diagnosis Date  . Anxiety   . Arthritis    "left knee" (07/05/2016)  . Asthma   . Bipolar 1 disorder ([place])   . Celiac disease    pt denies this hx on 07/05/2016  . Depression   . GERD (gastroesophageal reflux disease)   . Hypertension   . Post traumatic stress disorder   . Schizophrenia ([place])   . Stomach ulcer    Family History:  Family History  Problem Relation Age of Onset  . Breast cancer Unknown   . Lung cancer Unknown   . Congestive Heart Failure Unknown   . Diabetes Unknown   . Hypertension Unknown    Past Surgical History: Past Surgical History:  Procedure Laterality Date  . CARDIAC CATHETERIZATION  07/05/2016  . CARDIAC CATHETERIZATION N/A 07/05/2016  Procedure: Left Heart Cath and Coronary Angiography;  Surgeon: Adrian Prows, MD;  Location: [facility];  Service: Cardiovascular;  Laterality: N/A;  . CYST EXCISION Right    "wrist"  . DILATION AND CURETTAGE OF UTERUS    . FOOT SURGERY Bilateral    "corns removed"  . TUBAL LIGATION     Social History:  Patient widowed and lives with two children and two grandchildren. The patient endorses smoking cigarettes on average 1 per day ever since she was a teenager. Per chart review she drinks up to 2 alcoholic beverages per day and has not used illicit drugs (cocaine) since 2014.  Review of Systems: A complete ROS was negative except as per HPI.   Physical Exam: Blood pressure 120/79, pulse (!) 106,  temperature 98 F (36.7 C), temperature source Oral, resp. rate (!) 30, height 5' 9"  (1.753 m), weight 176 lb (79.8 kg), SpO2 98 %.  Physical Exam  Constitutional: She appears well-developed and well-nourished.  Patient sitting in bed, breathing rapidly but in no acute distress. At times patient is tremulous during exam.  Cardiovascular:  Patient tachycardic. Heart sounds normal without murmurs. 2+radial and posterior tibial pulses bilaterally.   Pulmonary/Chest:  Patient tachypneic without nasal flaring and accessory muscle use. NO signs of cyanosis or clubbing. Lungs clear to auscultation bilaterally without wheezing.  Abdominal: Soft. She exhibits no distension. There is no tenderness.  Musculoskeletal: She exhibits no edema (of bilateral lower extremities) or tenderness (of bilateral lower extremities).  Skin: Skin is warm and dry. Capillary refill takes less than 2 seconds. No rash noted. No erythema.   EKG: personally reviewed my interpretation is normal sinus rhythm, although the quality of this EKG is poor 2/2 patient movement and decreased voltage in limb leads  CXR: personally reviewed my interpretation is negative for consolidation and pleural effusions. There is no flattening of the diaphragm or suggestion of hyperinflation. The cardiac silhouette appears normal in size.  Assessment & Plan by Problem: Active Problems:   Asthma attack   Asthma exacerbation  Asthma exacerbation: Patient continues to be tachypneic after receiving IV methylprednisolone and albuterol in the ED. She also demonstrates tachycardia and tremulousness, which she states started after using the albuterol. She does not currently have wheezing on exam, but continues to endorse shortness of breath, cough, and chest tightness.  -Continuous respiratory monitoring -Duonebs q4 hours scheduled -Albuterol q4 hours PRN -Oral prednisone, 40 mg for 5 days -PFTs as an outpatient are recommended  HTN: BP well  controlled in ED. -Continue home HCTZ, 25 mg daily  FEN/GI: Patient complaining of increased urination and thirst; cannot remember being screened for T2DM, so will obtain A1C while inpatient to ensure symptoms do not result from new onset diabetes. [ ]  F/u A1C in the morning -Increase home omeprazole to 40 mg daily while on prednisone -Regular diet  VTE prophylaxis: -Patient can ambulate as tolerated, SCDs while in bed  Dispo: Admit patient to Observation with expected length of stay less than 2 midnights.  SignedThomasene Ripple, MD 06/14/2017, 4:57 PM  Pager: [phone]

## 2017-06-14 NOTE — ED Triage Notes (Signed)
Pt reports new cough since yesterday, reports feeling SOB since this am with ribcage pain bilat.  Pt reports subjective fever with chills, afebrile at this time.

## 2017-06-14 NOTE — ED Notes (Signed)
Dr Darl Householder in room to speak with pt. Pt still tachycardic and tachypneic, breath sounds have cleared but pt still shob. Pt is to be admitted.

## 2017-06-14 NOTE — ED Notes (Signed)
Pt escorted to restroom. Pt called out while in restroom. Pt thought she had bowel movement and blood noted in toilet. Pt states that this has happened before but it's been years. Pt has not had Menstrual cycle in over 3 years. Dr Darl Householder notified and at the bedside. Pt observed to be shob while ambulating back to bed.

## 2017-06-14 NOTE — ED Notes (Signed)
Pt still tachycardic. Dr Darl Householder to order d-dimer and another 1L bolus of fluid.

## 2017-06-14 NOTE — ED Notes (Signed)
ED Provider at bedside. 

## 2017-06-15 MED ORDER — DICLOFENAC SODIUM 1 % TD GEL
2.0000 g | Freq: Four times a day (QID) | TRANSDERMAL | Status: DC | PRN
  Administered 2017-06-15 – 2017-06-16 (×4): 2 g via TOPICAL
  Filled 2017-06-15: qty 100

## 2017-06-15 MED ORDER — ENOXAPARIN SODIUM 40 MG/0.4ML ~~LOC~~ SOLN
40.0000 mg | SUBCUTANEOUS | Status: DC
  Administered 2017-06-15: 40 mg via SUBCUTANEOUS
  Filled 2017-06-15: qty 0.4

## 2017-06-15 MED ORDER — MELATONIN 3 MG PO TABS
3.0000 mg | ORAL_TABLET | Freq: Once | ORAL | Status: AC
  Administered 2017-06-15: 3 mg via ORAL
  Filled 2017-06-15: qty 1

## 2017-06-15 MED ORDER — IPRATROPIUM-ALBUTEROL 0.5-2.5 (3) MG/3ML IN SOLN
3.0000 mL | Freq: Three times a day (TID) | RESPIRATORY_TRACT | Status: DC
  Administered 2017-06-15 – 2017-06-16 (×4): 3 mL via RESPIRATORY_TRACT
  Filled 2017-06-15 (×4): qty 3

## 2017-06-15 MED ORDER — LORATADINE 10 MG PO TABS
10.0000 mg | ORAL_TABLET | Freq: Every day | ORAL | Status: DC
  Administered 2017-06-15 – 2017-06-16 (×2): 10 mg via ORAL
  Filled 2017-06-15 (×2): qty 1

## 2017-06-15 MED ORDER — LORAZEPAM 0.5 MG PO TABS
0.5000 mg | ORAL_TABLET | Freq: Once | ORAL | Status: AC
  Administered 2017-06-15: 0.5 mg via ORAL
  Filled 2017-06-15: qty 1

## 2017-06-15 MED ORDER — GUAIFENESIN-CODEINE 100-10 MG/5ML PO SOLN
5.0000 mL | ORAL | Status: DC | PRN
  Administered 2017-06-15 – 2017-06-16 (×5): 5 mL via ORAL
  Filled 2017-06-15 (×5): qty 5

## 2017-06-15 NOTE — Progress Notes (Signed)
Humidity applied to O2 for pt comfort. RT will continue to monitor.

## 2017-06-15 NOTE — Plan of Care (Signed)
Problem: Pain Managment: Goal: General experience of comfort will improve Outcome: Not Progressing Patient having 10/10 of chest pain secondary to cough. MD paged, states he will come round on patient. Awaiting orders at this time.

## 2017-06-15 NOTE — Progress Notes (Signed)
Pt takes melatonin 3 mg po for sleep. None ordered at this time. Page to Dr. Maudie Mercury for notification. Dorthey Sawyer, RN

## 2017-06-15 NOTE — Progress Notes (Addendum)
   Subjective:  [name] reports some improvement today. Reports uncontrolled non-productive cough. Having pleuritic chest pain from the cough. No further emesis. Still feeling some shortness of breath but reports  with nebs.  improvement Objective:  Vital signs in last 24 hours: Vitals:   06/15/17 0020 06/15/17 0726 06/15/17 0900 06/15/17 1348  BP:   126/80   Pulse:   84   Resp:   20   Temp:   97.9 F (36.6 C)   TempSrc:   Oral   SpO2: 97% 97% 97% 97%  Weight:      Height:       General: alert, well-developed, and cooperative to examination. Lungs: poor respiratory effort. Good air movement. No wheezes appreciated, no prolonged expiratory phase.  Heart: normal rate, regular rhythm, no murmur, no gallop, and no rub.  Abdomen: soft, non-tender, normal bowel sounds  Assessment/Plan:  Asthma exacerbation with acute non-productive cough: Possibly 2/2 viral bronchitis vs seasonal allergies. SOB improving with nebs. Moving okay air on exam despite poor effort. No wheezes appreciated. No accessory muscle use. Biggest complaint is cough x3 days. Non-productive. Will give cough syrup with codeine. Continue nebs, prednisone. Will start loratadine.  -Duonebs q4 hours scheduled and albuterol q4 hours PRN -Oral prednisone, 40 mg for 5 days (Day 2 today) -Loratadine, 10 mg daily -Peak flow ordered today  HTN: BP well controlled -Continue home HCTZ, 25 mg daily  Thirst and increased urinary frequency: Patient complaining of increased urination and thirst; cannot remember being screened for T2DM, so will obtain A1C while inpatient to ensure symptoms do not result from new onset diabetes. A1C pending.  Dispo: Anticipated discharge in approximately 1 day(s).   Maryellen Pile, MD 06/15/2017, 5:18 PM  Internal Medicine Attending  Date: 06/15/2017  Patient name: Denise Knight Medical record number: [id number] Date of birth: 09-18-1966 Age: 51 y.o. Gender: female  I saw and evaluated the  patient. I reviewed the resident's note by Dr. Charlynn Grimes and I agree with the resident's findings and plans as documented in his progress note.  Please see my H&P dated 06/15/2017 and attached to Dr. Gevena Barre H&P dated 06/14/2017 for the specifics of by evaluation, assessment, and plan from earlier in the day.

## 2017-06-16 LAB — HEMOGLOBIN A1C
Hgb A1c MFr Bld: 5.8 % — ABNORMAL HIGH (ref 4.8–5.6)
MEAN PLASMA GLUCOSE: 120 mg/dL

## 2017-06-16 MED ORDER — DICLOFENAC SODIUM 1 % TD GEL: 2.0000 g | Freq: Four times a day (QID) | TRANSDERMAL | 0 refills | Status: DC | PRN

## 2017-06-16 MED ORDER — PREDNISONE 20 MG PO TABS: 40.0000 mg | ORAL_TABLET | Freq: Every day | ORAL | 0 refills | Status: AC

## 2017-06-16 MED ORDER — LORATADINE 10 MG PO TABS: 10.0000 mg | ORAL_TABLET | Freq: Every day | ORAL | 0 refills | Status: DC

## 2017-06-16 MED ORDER — NICOTINE 7 MG/24HR TD PT24: 7.0000 mg | MEDICATED_PATCH | Freq: Every day | TRANSDERMAL | 3 refills | Status: DC

## 2017-06-16 MED ORDER — GUAIFENESIN-CODEINE 100-10 MG/5ML PO SOLN
5.0000 mL | ORAL | 0 refills | Status: DC | PRN
Start: 2017-06-16 — End: 2018-06-10

## 2017-06-16 NOTE — Discharge Summary (Signed)
Name: Denise Knight MRN: [id number] DOB: [birth year] 51 y.o. PCP: Medicine, [facility]  Date of Admission: 06/14/2017  9:59 AM Date of Discharge: 06/16/2017 Attending Physician: Oval Linsey, MD  Discharge Diagnosis:  Principal Problem:   Acute viral bronchitis Active Problems:   Asthma exacerbation   Discharge Medications: Allergies as of 06/16/2017      Reactions   Aspirin Hives   Food Hives   "Regular butter"      Medication List    STOP taking these medications   atorvastatin 80 MG tablet Commonly known as:  LIPITOR   clopidogrel 75 MG tablet Commonly known as:  PLAVIX   cyclobenzaprine 10 MG tablet Commonly known as:  FLEXERIL   diclofenac 50 MG EC tablet Commonly known as:  VOLTAREN   diphenhydrAMINE 25 MG tablet Commonly known as:  BENADRYL   HYDROcodone-acetaminophen 5-325 MG tablet Commonly known as:  NORCO   indomethacin 50 MG capsule Commonly known as:  INDOCIN   methocarbamol 500 MG tablet Commonly known as:  ROBAXIN   metoprolol succinate 25 MG 24 hr tablet Commonly known as:  TOPROL XL   nitroGLYCERIN 0.4 MG SL tablet Commonly known as:  NITROSTAT   vitamin C 500 MG tablet Commonly known as:  ASCORBIC ACID     TAKE these medications   albuterol 108 (90 Base) MCG/ACT inhaler Commonly known as:  PROVENTIL HFA;VENTOLIN HFA Inhale 2 puffs into the lungs every 6 (six) hours as needed for wheezing or shortness of breath.   diclofenac sodium 1 % Gel Commonly known as:  VOLTAREN Apply 2 g topically 4 (four) times daily as needed (MSK pain associated with cough).   guaiFENesin-codeine 100-10 MG/5ML syrup Take 5 mLs by mouth every 4 (four) hours as needed for cough.   hydrochlorothiazide 25 MG tablet Commonly known as:  HYDRODIURIL Take 25 mg by mouth daily.   loratadine 10 MG tablet Commonly known as:  CLARITIN Take 1 tablet (10 mg total) by mouth daily.   nicotine 7 mg/24hr patch Commonly known as:  NICODERM CQ -  dosed in mg/24 hr Place 1 patch (7 mg total) onto the skin daily.   omeprazole 20 MG capsule Commonly known as:  PRILOSEC Take 20 mg by mouth daily.   predniSONE 20 MG tablet Commonly known as:  DELTASONE Take 2 tablets (40 mg total) by mouth daily with breakfast.       Disposition and follow-up:   [name] was discharged from [facility] in Good condition.  At the hospital follow up visit please address:  1.  The patient was treated for viral bronchitis in the setting of mild, intermittent asthma. She came into ED with increased work of breathing and presumed asthma exacerbation. The patient was treated for an asthma exacerbation initially, however she did not have resolution of her increased work of breath/increase respiratory rate until we treated her cough and inspiratory chest pain. Please assess resolution of symptoms.  2.  Labs / imaging needed at time of follow-up: None  3.  Pending labs/ test needing follow-up: A1C taken on admission for patient's complaint of increased thirst and urination over the past few months.  Follow-up Appointments: Follow-up Information    Primary Care Physician. Schedule an appointment as soon as possible for a visit in 1 week(s).   Why:  Please follow up with PCP within 1 week to ensure resolution of your symptoms.          Hospital Course by problem  list: Principal Problem:   Acute viral bronchitis Active Problems:   Asthma exacerbation   Denise Knight is a 51 yo with a PMH of HTN, seasonal allergies, and mild, intermittent asthma who nitially came into the ED with increased work of breathing and concern for an acute exacerbation, but her main complaint was cough. The problems addressed during this admission   Viral bronchitis in the setting of mild, intermittent asthma and seasonal allergies: Her work of breathing improved somewhat with inhaled albuterol/duonebs, however her cough persisted and she continued  to pain with deep inspiration and resulting tachypnea after she moved to the floor from the ED. Her cough and pain with deep inspiration improved with cough syrup and use of Voltaren gel. We suspect a viral bronchitis triggered some of her mild, intermittent asthma symptoms. This is unlikely to be an acute asthma exacerbation, as her work of breathing and overall clinical picture did not dramatically improve until we treated her cough and chest pain. The patient was discharged with instructions to continue her regimen of cough syrup, voltaren gel, and daily claritin. She was also instructed to continue her albuterol inhaler PRN and finish her 5 day course of prednisone 40 mg.   HTN: BP well controlled on home regimen of HCTZ, 25 mg daily. This problem was not directly addressed during admission.  Thirst and increased urinary frequency: Patient complaining of increased urination and thirst; cannot remember being screened for T2DM, so will obtain A1C while inpatient to ensure symptoms do not result from new onset diabetes. A1C pending and follow up with PCP.   Tobacco use: Patient given nicotine patch while inpatient. Requesting this upon discharge. Please follow up at PCP visit.   Discharge Vitals:   BP 118/82 (BP Location: Right Arm)   Pulse 75   Temp 98 F (36.7 C) (Oral)   Resp 18   Ht 5' 9"  (1.753 m)   Wt 184 lb 9.6 oz (83.7 kg)   SpO2 98%   BMI 27.26 kg/m   Pertinent Labs, Studies, and Procedures:   BMP Latest Ref Rng & Units 06/14/2017 05/21/2017 04/13/2017  Glucose 65 - 99 mg/dL 88 83 123(H)  BUN 6 - 20 mg/dL 14 12 15   Creatinine 0.44 - 1.00 mg/dL 0.91 0.82 1.12(H)  Sodium 135 - 145 mmol/L 142 137 139  Potassium 3.5 - 5.1 mmol/L 4.3 3.7 4.6  Chloride 101 - 111 mmol/L 107 106 108  CO2 22 - 32 mmol/L 24 20(L) 23  Calcium 8.9 - 10.3 mg/dL 9.6 9.6 9.5   CBC Latest Ref Rng & Units 06/14/2017 05/21/2017 04/13/2017  WBC 4.0 - 10.5 K/uL 8.1 7.8 9.3  Hemoglobin 12.0 - 15.0 g/dL 11.8(L) 12.8  12.3  Hematocrit 36.0 - 46.0 % 35.7(L) 40.2 38.5  Platelets 150 - 400 K/uL 278 288 228   D-Dimer = 0.32 Troponin = 0.00  Discharge Instructions: Discharge Instructions    Call MD for:  difficulty breathing, headache or visual disturbances    Complete by:  As directed    Call MD for:  persistant dizziness or light-headedness    Complete by:  As directed    Call MD for:  persistant nausea and vomiting    Complete by:  As directed    Call MD for:  temperature >100.4    Complete by:  As directed    Diet general    Complete by:  As directed    Discharge instructions    Complete by:  As directed  Please follow up with your PCP within the week to ensure resolution of your symptoms.   Increase activity slowly    Complete by:  As directed       Signed: Thomasene Ripple, MD 06/16/2017, 11:02 AM   Pager: [phone]

## 2017-06-16 NOTE — Care Management Note (Signed)
Case Management Note  Patient Details  Name: Denise Knight MRN: [id number] Date of Birth: 11/20/1965  Subjective/Objective:       CM following for progression and d/c planning.              Action/Plan: 06/16/2017 Met with pt , MATCH letter given to assist with medications upon d/c . Letter explained to pt and list of pharmacies participating in [facility] program provided. Pt MD will provide prescriptions for pt to take with letter to her pharmacy [facility] on [address]. No other d/c needs identified. Pt is alert and independent of ADLs.   Expected Discharge Date:  06/16/17               Expected Discharge Plan:  Home/Self Care  In-House Referral:  NA  Discharge planning Services  CM Consult, [place] Program  Post Acute Care Choice:  NA Choice offered to:  NA  DME Arranged:  N/A DME Agency:  NA  HH Arranged:  NA HH Agency:  NA  Status of Service:  Completed, signed off  If discussed at [place] of Stay Meetings, dates discussed:    Additional Comments:  Adron Bene, RN 06/16/2017, 11:02 AM

## 2017-06-16 NOTE — Discharge Instructions (Signed)

## 2017-06-16 NOTE — Progress Notes (Signed)
Patient Discharge: Disposition: Patient discharged to home. Education: Reviewed medications, prescriptions, follow-up appointment, and discharge instructions, verbalized understanding. IV: Discontinued IV before discharge.  Site clean and dry. Telemetry: N/A. Transportation: Patient escorted out of the unit in w/c till her ride. Belongings: Patient took all her belongings with her.  Case Manager gave Match letter which she took with her and also work letter given by the doctor.

## 2017-06-16 NOTE — Progress Notes (Signed)
   Subjective: Denise Knight was seen laying comfortably in bed this morning. She still reports pain in her lower ribcage which occurs with coughing, but has improved with cough medicine and use of Voltaren gel. She states that this was the first night she was able to sleep in a few night and she was happy for the relief. She denies worsening shortness of breath, radiating chest pain, headaches, and dizziness.  Objective:  Vital signs in last 24 hours: Vitals:   06/15/17 1954 06/15/17 2020 06/16/17 0610 06/16/17 0907  BP:  115/70 118/82   Pulse:  93 75   Resp:  18 18   Temp:  98.2 F (36.8 C) 98 F (36.7 C)   TempSrc:  Oral Oral   SpO2: 98% 95% 95% 98%  Weight:      Height:       Physical Exam  Constitutional: She appears well-developed and well-nourished. No distress.  Cardiovascular: Normal rate, regular rhythm and normal heart sounds.   No murmur heard. Pulmonary/Chest: Effort normal. No respiratory distress. She has no wheezes.  Air movement throughout all lung fields shows interval improvement from yesterday's exam.  Abdominal: Soft. She exhibits no distension. There is no tenderness.  Musculoskeletal: She exhibits tenderness (with palpation of around lower rib cage bilaterally). She exhibits no edema (of bilateral lower extremities) or deformity (of lower rib cage bilaterally).  Neurological:  Gross motor and sensation intact in bilateral upper and lower extremities.  Skin: Skin is warm and dry. Capillary refill takes less than 2 seconds. No rash noted. No erythema.  Psychiatric: Her behavior is normal. Judgment and thought content normal.   Assessment/Plan:  Principal Problem:   Acute viral bronchitis Active Problems:   Asthma exacerbation  Viral bronchitis in the setting of mild, intermittent asthma and seasonal allergies: The patient initially came into the ED with increased work of breathing and concern for an acute exacerbation, but her main complaint was cough. Her work  of breathing improved somewhat with inhaled medications, however her cough persisted and the patient continued to have tachypnea 2/2 pain with deep inspiration. Overnight her cough, SOB, and chest pain improved with cough syrup containing codeine and Voltaren gel. Her work of breathing and air movement were improved from yesterday's exam. We suspect she has a viral bronchitis, which may have triggered some of her mild intermittent asthma symptoms. This is unlikely to be solely an acute asthma exacerbation, as her work of breathing and overall clinical picture did not dramatically improve until we treated her cough and chest pain.  -Patient can continue home albuterol PRN -Oral prednisone, 40 mg for 5 days (Day 3 today, end on 8/8) -Loratadine, 10 mg daily for seasonal allergies.  HTN: BP well controlled -Continue home HCTZ, 25 mg daily  Thirst and increased urinary frequency: Patient complaining of increased urination and thirst; cannot remember being screened for T2DM, so will obtain A1C while inpatient to ensure symptoms do not result from new onset diabetes. A1C pending and follow up with PCP.   Dispo: Anticipated discharge in approximately 1 day.   Thomasene Ripple, MD 06/16/2017, 9:25 AM Pager: [phone]

## 2017-07-10 ENCOUNTER — Encounter (HOSPITAL_COMMUNITY): Payer: Self-pay

## 2017-07-10 ENCOUNTER — Emergency Department (HOSPITAL_COMMUNITY): Payer: Self-pay

## 2017-07-10 ENCOUNTER — Emergency Department (HOSPITAL_COMMUNITY)
Admission: EM | Admit: 2017-07-10 | Discharge: 2017-07-10 | Disposition: A | Discharge status: Home / Self Care | Payer: Self-pay | Attending: Emergency Medicine | Admitting: Emergency Medicine

## 2017-07-10 DIAGNOSIS — J069 Acute upper respiratory infection, unspecified: Secondary | ICD-10-CM | POA: Insufficient documentation

## 2017-07-10 DIAGNOSIS — J45909 Unspecified asthma, uncomplicated: Secondary | ICD-10-CM | POA: Insufficient documentation

## 2017-07-10 DIAGNOSIS — F17210 Nicotine dependence, cigarettes, uncomplicated: Secondary | ICD-10-CM | POA: Insufficient documentation

## 2017-07-10 DIAGNOSIS — B029 Zoster without complications: Secondary | ICD-10-CM | POA: Insufficient documentation

## 2017-07-10 DIAGNOSIS — I10 Essential (primary) hypertension: Secondary | ICD-10-CM | POA: Insufficient documentation

## 2017-07-10 DIAGNOSIS — B349 Viral infection, unspecified: Secondary | ICD-10-CM | POA: Insufficient documentation

## 2017-07-10 DIAGNOSIS — Z79899 Other long term (current) drug therapy: Secondary | ICD-10-CM | POA: Insufficient documentation

## 2017-07-10 MED ORDER — ACYCLOVIR 400 MG PO TABS: 800.0000 mg | ORAL_TABLET | Freq: Every day | ORAL | 0 refills | Status: AC

## 2017-07-10 MED ORDER — ACYCLOVIR 200 MG PO CAPS
800.0000 mg | ORAL_CAPSULE | Freq: Once | ORAL | Status: AC
  Administered 2017-07-10: 800 mg via ORAL
  Filled 2017-07-10: qty 4

## 2017-07-10 MED ORDER — OXYCODONE-ACETAMINOPHEN 5-325 MG PO TABS
1.0000 | ORAL_TABLET | Freq: Once | ORAL | Status: AC
  Administered 2017-07-10: 1 via ORAL
  Filled 2017-07-10: qty 1

## 2017-07-10 MED ORDER — OXYCODONE-ACETAMINOPHEN 5-325 MG PO TABS: 1.0000 | ORAL_TABLET | Freq: Four times a day (QID) | ORAL | 0 refills | Status: DC | PRN

## 2017-07-10 MED ORDER — IPRATROPIUM-ALBUTEROL 0.5-2.5 (3) MG/3ML IN SOLN
3.0000 mL | Freq: Once | RESPIRATORY_TRACT | Status: AC
  Administered 2017-07-10: 3 mL via RESPIRATORY_TRACT
  Filled 2017-07-10: qty 3

## 2017-07-10 NOTE — ED Triage Notes (Signed)
Pt has rash under her breast and into her back. Fluid filled blisters noted. Pt afebrile. Reports pain in the back.

## 2017-07-10 NOTE — ED Notes (Signed)
Pt would like something to eat.

## 2017-07-10 NOTE — ED Provider Notes (Signed)
[facility] DEPT Provider Note   CSN: [id number] Arrival date & time: 07/10/17  1975     History   Chief Complaint Chief Complaint  Patient presents with  . Herpes Zoster    HPI Denise Knight is a 51 y.o. female.  51yo F w/ PMH including asthma, HTN, GERD, bipolar d/o who p/w chest wall rash and pain. Yesterday she began noticing a rash under her left breast and along Ieft upper back. The rash has been getting worse and today she began having pain in this same area. Pain is moderate to severe and constant. No one else around her has the same rash. No fevers, vomiting, or abdominal pain. She does note recent cough and congestion with some wheezing and occasional shortness of breath. She has a history of asthma as well as smoking. She denies any chest pain.   The history is provided by the patient.    Past Medical History:  Diagnosis Date  . Anxiety   . Arthritis    "left knee" (07/05/2016)  . Asthma   . Bipolar 1 disorder ([place])   . Celiac disease    pt denies this hx on 07/05/2016  . Depression   . GERD (gastroesophageal reflux disease)   . Hypertension   . Post traumatic stress disorder   . Schizophrenia ([place])   . Stomach ulcer     Patient Active Problem List   Diagnosis Date Noted  . Acute viral bronchitis 06/14/2017  . Asthma exacerbation 06/14/2017  . Chest pain 07/05/2016    Past Surgical History:  Procedure Laterality Date  . CARDIAC CATHETERIZATION  07/05/2016  . CARDIAC CATHETERIZATION N/A 07/05/2016   Procedure: Left Heart Cath and Coronary Angiography;  Surgeon: Adrian Prows, MD;  Location: [facility];  Service: Cardiovascular;  Laterality: N/A;  . CYST EXCISION Right    "wrist"  . DILATION AND CURETTAGE OF UTERUS    . FOOT SURGERY Bilateral    "corns removed"  . TUBAL LIGATION      OB History    No data available       Home Medications    Prior to Admission medications   Medication Sig Start Date End Date Taking? Authorizing  Provider  acyclovir (ZOVIRAX) 400 MG tablet Take 2 tablets (800 mg total) by mouth 5 (five) times daily. 07/10/17 07/17/17  Little, Wenda Overland, MD  albuterol (PROVENTIL HFA;VENTOLIN HFA) 108 (90 BASE) MCG/ACT inhaler Inhale 2 puffs into the lungs every 6 (six) hours as needed for wheezing or shortness of breath.    [provider]  diclofenac sodium (VOLTAREN) 1 % GEL Apply 2 g topically 4 (four) times daily as needed (MSK pain associated with cough). 06/16/17   Thomasene Ripple, MD  guaiFENesin-codeine 100-10 MG/5ML syrup Take 5 mLs by mouth every 4 (four) hours as needed for cough. 06/16/17   Thomasene Ripple, MD  hydrochlorothiazide (HYDRODIURIL) 25 MG tablet Take 25 mg by mouth daily.    [provider]  loratadine (CLARITIN) 10 MG tablet Take 1 tablet (10 mg total) by mouth daily. 06/16/17   Thomasene Ripple, MD  nicotine (NICODERM CQ - DOSED IN MG/24 HR) 7 mg/24hr patch Place 1 patch (7 mg total) onto the skin daily. 06/17/17   Thomasene Ripple, MD  omeprazole (PRILOSEC) 20 MG capsule Take 20 mg by mouth daily.     [provider]  oxyCODONE-acetaminophen (PERCOCET) 5-325 MG tablet Take 1-2 tablets by mouth every 6 (six) hours as needed. 07/10/17   Little, Apolonio Schneiders  Lilia Pro, MD    Family History Family History  Problem Relation Age of Onset  . Breast cancer Unknown   . Lung cancer Unknown   . Congestive Heart Failure Unknown   . Diabetes Unknown   . Hypertension Unknown     Social History Social History  Substance Use Topics  . Smoking status: Current Some Day Smoker    Packs/day: 0.25    Years: 35.00    Types: Cigarettes  . Smokeless tobacco: Never Used  . Alcohol use 8.4 oz/week    14 Cans of beer per week     Allergies   Aspirin and Food   Review of Systems Review of Systems All other systems reviewed and are negative except that which was mentioned in HPI   Physical Exam Updated Vital Signs BP (!) 131/98 (BP Location: Right Arm)   Pulse 84    Temp 97.6 F (36.4 C) (Oral)   Resp 18   SpO2 97%   Physical Exam  Constitutional: She is oriented to person, place, and time. She appears well-developed and well-nourished. No distress.  HENT:  Head: Normocephalic and atraumatic.  Moist mucous membranes  Eyes: Pupils are equal, round, and reactive to light. Conjunctivae are normal.  Neck: Neck supple.  Cardiovascular: Normal rate, regular rhythm and normal heart sounds.   No murmur heard. Pulmonary/Chest: Effort normal.  Diminished BS b/l  Abdominal: Soft. Bowel sounds are normal. She exhibits no distension. There is no tenderness.  Musculoskeletal: She exhibits no edema.  Neurological: She is alert and oriented to person, place, and time.  Fluent speech  Skin: Skin is warm and dry. Rash noted.  Scattered vesicular rash in patches along L upper back, axilla, and under L breast, tender; no fluctuance  Psychiatric: She has a normal mood and affect. Judgment normal.  Nursing note and vitals reviewed.    ED Treatments / Results  Labs (all labs ordered are listed, but only abnormal results are displayed) Labs Reviewed - No data to display  EKG  EKG Interpretation None       Radiology Dg Chest 2 View  Result Date: 07/10/2017 CLINICAL DATA:  Left-sided chest and arm pain with shortness of breath. History of asthma and is a current smoker. EXAM: CHEST  2 VIEW COMPARISON:  Portable chest x-ray of June 14, 2017 FINDINGS: The lungs remain mildly hyperinflated. There is no infiltrate, atelectasis, or pleural effusion. The heart and pulmonary vascularity are normal. The mediastinum is normal in width. There is faint calcification in the wall of the aortic arch. The bony thorax exhibits no acute abnormality. IMPRESSION: Mild hyperinflation consistent with known reactive airway disease. No pneumonia, CHF, nor other acute cardiopulmonary abnormality. Electronically Signed   By: David  Martinique M.D.   On: 07/10/2017 12:30     Procedures Procedures (including critical care time)  Medications Ordered in ED Medications  ipratropium-albuterol (DUONEB) 0.5-2.5 (3) MG/3ML nebulizer solution 3 mL (3 mLs Nebulization Given 07/10/17 1232)  oxyCODONE-acetaminophen (PERCOCET/ROXICET) 5-325 MG per tablet 1 tablet (1 tablet Oral Given 07/10/17 1232)  acyclovir (ZOVIRAX) 200 MG capsule 800 mg (800 mg Oral Given 07/10/17 1232)     Initial Impression / Assessment and Plan / ED Course  I have reviewed the triage vital signs and the nursing notes.  Pertinent  imaging results that were available during my care of the patient were reviewed by me and considered in my medical decision making (see chart for details).    Pt w/ 1 day of A vesicular  rash that has become painful. Exam consistent with herpes zoster. Her vital signs here are reassuring, she is afebrile. She did mention some cough and congestion therefore added chest x-ray and DuoNeb given her history of asthma.  Chest x-ray negative acute. Patient stated that she felt better after receiving breathing treatments. She has albuterol to use at home as needed. Given that she has no wheezing on my exam and do not feel she needs steroids at this time.  She has no evidence of systemic illness and is well-appearing on reexamination with stable vital signs. Gave acyclovir and percocet for home. I discussed importance of follow-up with PCP and emphasized return precautions including any fever or worsening symptoms. Patient voiced understanding and was discharged in satisfactory condition.  Final Clinical Impressions(s) / ED Diagnoses   Final diagnoses:  Viral upper respiratory tract infection  Herpes zoster without complication    New Prescriptions Discharge Medication List as of 07/10/2017  1:35 PM    START taking these medications   Details  acyclovir (ZOVIRAX) 400 MG tablet Take 2 tablets (800 mg total) by mouth 5 (five) times daily., Starting Thu 07/10/2017, Until Thu  07/17/2017, Print    oxyCODONE-acetaminophen (PERCOCET) 5-325 MG tablet Take 1-2 tablets by mouth every 6 (six) hours as needed., Starting Thu 07/10/2017, Print         Little, Wenda Overland, MD 07/10/17 [phone]

## 2017-07-14 ENCOUNTER — Emergency Department (HOSPITAL_COMMUNITY): Payer: Self-pay

## 2017-07-14 ENCOUNTER — Encounter (HOSPITAL_COMMUNITY): Payer: Self-pay

## 2017-07-14 ENCOUNTER — Emergency Department (HOSPITAL_COMMUNITY)
Admission: EM | Admit: 2017-07-14 | Discharge: 2017-07-14 | Disposition: A | Discharge status: Home / Self Care | Payer: Self-pay | Attending: Emergency Medicine | Admitting: Emergency Medicine

## 2017-07-14 DIAGNOSIS — I10 Essential (primary) hypertension: Secondary | ICD-10-CM | POA: Insufficient documentation

## 2017-07-14 DIAGNOSIS — S62521A Displaced fracture of distal phalanx of right thumb, initial encounter for closed fracture: Secondary | ICD-10-CM | POA: Insufficient documentation

## 2017-07-14 DIAGNOSIS — J45909 Unspecified asthma, uncomplicated: Secondary | ICD-10-CM | POA: Insufficient documentation

## 2017-07-14 DIAGNOSIS — Y929 Unspecified place or not applicable: Secondary | ICD-10-CM | POA: Insufficient documentation

## 2017-07-14 DIAGNOSIS — Y939 Activity, unspecified: Secondary | ICD-10-CM | POA: Insufficient documentation

## 2017-07-14 DIAGNOSIS — F17210 Nicotine dependence, cigarettes, uncomplicated: Secondary | ICD-10-CM | POA: Insufficient documentation

## 2017-07-14 DIAGNOSIS — W07XXXA Fall from chair, initial encounter: Secondary | ICD-10-CM | POA: Insufficient documentation

## 2017-07-14 DIAGNOSIS — Y998 Other external cause status: Secondary | ICD-10-CM | POA: Insufficient documentation

## 2017-07-14 DIAGNOSIS — Z79899 Other long term (current) drug therapy: Secondary | ICD-10-CM | POA: Insufficient documentation

## 2017-07-14 MED ORDER — MELOXICAM 7.5 MG PO TABS: 7.5000 mg | ORAL_TABLET | Freq: Every day | ORAL | 0 refills | Status: DC

## 2017-07-14 MED ORDER — ACETAMINOPHEN 325 MG PO TABS
650.0000 mg | ORAL_TABLET | Freq: Once | ORAL | Status: AC
  Administered 2017-07-14: 650 mg via ORAL
  Filled 2017-07-14: qty 2

## 2017-07-14 NOTE — ED Provider Notes (Signed)
[facility] DEPT Provider Note   CSN: [id number] Arrival date & time: 07/14/17  1408     History   Chief Complaint Chief Complaint  Patient presents with  . Hand Injury    HPI Denise Knight is a 51 y.o. female.  HPI  Patient is a 51 year old female presenting for hand pain after a fall yesterday getting up out of a chair. Patient reports that there was no preceding syncopal or presyncopal event, and that this was a mechanical fall. Patient did not hit her head during this fall. Patient reports that the pain is located in the distal tip of her thumb. Patient reports paresthesias in the right thumb. Patient denies any pallor of this finger over the past 24 hours. Additionally, patient has some pain and swelling of the left elbow from this fall.   Patient was also evaluated in the emergency department last week for shingles of the left posterior thorax. She reports no concerns with healing of the lesions at this time and has taken all antivirals.  Past Medical History:  Diagnosis Date  . Anxiety   . Arthritis    "left knee" (07/05/2016)  . Asthma   . Bipolar 1 disorder ([place])   . Celiac disease    pt denies this hx on 07/05/2016  . Depression   . GERD (gastroesophageal reflux disease)   . Hypertension   . Post traumatic stress disorder   . Schizophrenia ([place])   . Stomach ulcer     Patient Active Problem List   Diagnosis Date Noted  . Acute viral bronchitis 06/14/2017  . Asthma exacerbation 06/14/2017  . Chest pain 07/05/2016    Past Surgical History:  Procedure Laterality Date  . CARDIAC CATHETERIZATION  07/05/2016  . CARDIAC CATHETERIZATION N/A 07/05/2016   Procedure: Left Heart Cath and Coronary Angiography;  Surgeon: Adrian Prows, MD;  Location: [facility];  Service: Cardiovascular;  Laterality: N/A;  . CYST EXCISION Right    "wrist"  . DILATION AND CURETTAGE OF UTERUS    . FOOT SURGERY Bilateral    "corns removed"  . TUBAL LIGATION      OB History      No data available       Home Medications    Prior to Admission medications   Medication Sig Start Date End Date Taking? Authorizing Provider  acyclovir (ZOVIRAX) 400 MG tablet Take 2 tablets (800 mg total) by mouth 5 (five) times daily. 07/10/17 07/17/17  Little, Wenda Overland, MD  albuterol (PROVENTIL HFA;VENTOLIN HFA) 108 (90 BASE) MCG/ACT inhaler Inhale 2 puffs into the lungs every 6 (six) hours as needed for wheezing or shortness of breath.    [provider]  diclofenac sodium (VOLTAREN) 1 % GEL Apply 2 g topically 4 (four) times daily as needed (MSK pain associated with cough). 06/16/17   Thomasene Ripple, MD  guaiFENesin-codeine 100-10 MG/5ML syrup Take 5 mLs by mouth every 4 (four) hours as needed for cough. 06/16/17   Thomasene Ripple, MD  hydrochlorothiazide (HYDRODIURIL) 25 MG tablet Take 25 mg by mouth daily.    [provider]  loratadine (CLARITIN) 10 MG tablet Take 1 tablet (10 mg total) by mouth daily. 06/16/17   Thomasene Ripple, MD  nicotine (NICODERM CQ - DOSED IN MG/24 HR) 7 mg/24hr patch Place 1 patch (7 mg total) onto the skin daily. 06/17/17   Thomasene Ripple, MD  omeprazole (PRILOSEC) 20 MG capsule Take 20 mg by mouth daily.     [provider]  oxyCODONE-acetaminophen (PERCOCET) 5-325 MG tablet Take 1-2 tablets by mouth every 6 (six) hours as needed. 07/10/17   Little, Wenda Overland, MD    Family History Family History  Problem Relation Age of Onset  . Breast cancer Unknown   . Lung cancer Unknown   . Congestive Heart Failure Unknown   . Diabetes Unknown   . Hypertension Unknown     Social History Social History  Substance Use Topics  . Smoking status: Current Some Day Smoker    Packs/day: 0.25    Years: 35.00    Types: Cigarettes  . Smokeless tobacco: Never Used  . Alcohol use 8.4 oz/week    14 Cans of beer per week     Allergies   Aspirin and Food   Review of Systems Review of Systems  Musculoskeletal: Positive for  arthralgias and joint swelling.  Skin: Negative for pallor and rash.  Neurological: Negative for dizziness, syncope and light-headedness.    Physical Exam Updated Vital Signs BP (!) 136/101 (BP Location: Right Arm)   Pulse 87   Temp 98.1 F (36.7 C) (Oral)   Resp 16   Ht 5' 8.5" (1.74 m)   Wt 84.8 kg (187 lb)   SpO2 99%   BMI 28.02 kg/m   Physical Exam  Constitutional: She appears well-developed and well-nourished. No distress.  Sitting comfortably in bed.  HENT:  Head: Normocephalic and atraumatic.  Eyes: Conjunctivae are normal. Right eye exhibits no discharge. Left eye exhibits no discharge.  EOMs normal to gross examination.  Neck: Normal range of motion.  Cardiovascular: Normal rate, regular rhythm and intact distal pulses.   Intact, 2+ radial pulse on right.  Pulmonary/Chest:  Normal respiratory effort. Patient converses comfortably. No audible wheeze or stridor.  Abdominal: She exhibits no distension.  Musculoskeletal: Normal range of motion.  Swelling and ecchymosis of the right pollux. No pallor. Pain to palpation of right MCP joint of the thumb, DIP joint. Pain to palpation of lateral thumb of first metacarpal. No tenderness over anatomic snuffbox. No pain to palpation of the volar or dorsal wrist. Range of motion of the right thumb limited due to pain, however patient can flex/extend and abduct/adduct without difficulty. Ligamentous examination limited due to pain. Full ROM of right wrist.   Neurological: She is alert.  Cranial nerves intact to gross observation. Patient moves extremities without difficulty.  Sensation to light touch not intact in distal right thumb. Sensation in all other fingers and right intact to light touch.  Skin: Skin is warm and dry. She is not diaphoretic.  Psychiatric: She has a normal mood and affect. Her behavior is normal. Judgment and thought content normal.  Nursing note and vitals reviewed.    ED Treatments / Results   Labs (all labs ordered are listed, but only abnormal results are displayed) Labs Reviewed - No data to display  EKG  EKG Interpretation None       Radiology Dg Finger Thumb Right  Result Date: 07/14/2017 CLINICAL DATA:  Status post trauma to right thumb yesterday with pain. EXAM: RIGHT THUMB 2+V COMPARISON:  None. FINDINGS: There is comminuted displaced fracture of the midshaft of first distal phalanx. There is no dislocation. IMPRESSION: Fracture of the first distal phalanx. Electronically Signed   By: Abelardo Diesel M.D.   On: 07/14/2017 14:54    Procedures Procedures (including critical care time)  Medications Ordered in ED Medications  acetaminophen (TYLENOL) tablet 650 mg (not administered)     Initial Impression / Assessment  and Plan / ED Course  I have reviewed the triage vital signs and the nursing notes.  Pertinent labs & imaging results that were available during my care of the patient were reviewed by me and considered in my medical decision making (see chart for details).  Clinical Course as of Jul 15 1703  Mon Jul 14, 2017  1701 Patient seen and evaluated. I showed her the image of her x-ray where she has a fracture. We discussed pain control options, including Mobic and Tylenol.  [AM]  1702 Patient case discussed with Dr. Noemi Chapel. Discussed patient's history of gastric ulcers, and prescribing Mobic. Pt is currently on omeprazole.  [AM]    Clinical Course User Index [AM] Albesa Seen, PA-C     Final Clinical Impressions(s) / ED Diagnoses   Final diagnoses:  None   MDM  Patient is a 51 year old female presenting 24 hours after a [place] injury, and ultimately has a mid shaft, comminuted, displaced fracture of the distal phalanx in right thumb. Due to lateral proximal phalanx/MCP tenderness, patient underwent additional x-ray of the right wrist, and no fracture identified. No clinical concerns for compartment syndrome at this time. Patient discharged  in static finger splint, given return precautions for any increasing swelling, pain, pallor, given instructions to follow RICE therapy in the meantime, and given instructions to follow-up as soon as possible with Dr. Marcelino Scot in orthopedic surgery. Discussed pain control with Mobic and patient's history of gastric ulcers with patient. Patient currently on omeprazole, and understands that she needs to continue to take this medication. Patient is in understanding and agreed to the plan of care.   Blood pressure elevated to 144/95 during ED course today. This is noted to patient discharge summary with instructions to follow-up.  This is a shared visit with Dr. Noemi Chapel. Patient was independently evaluated by this attending physician. Attending physician consulted in evaluation and discharge management.  New Prescriptions New Prescriptions   No medications on file      Tamala Julian 07/14/17 2314    Noemi Chapel, MD 07/15/17 [phone]

## 2017-07-14 NOTE — ED Triage Notes (Addendum)
PT reports sitting in char yesterday that tipped over. She used right hand to catch herself and is now having pain to right thumb. Pt has known dx of shingles las Thursday on back. Sores are covered and not draining at this time.

## 2017-07-14 NOTE — Progress Notes (Signed)
Orthopedic Tech Progress Note Patient Details:  Denise Knight 1965/12/21 [id number]  Ortho Devices Type of Ortho Device: Finger splint, Arm sling Ortho Device/Splint Location: applied finger splint to pt thumb (right).  pt tolerated application very well.  applied arm sling for support of hand.  right hand thumb.  Ortho Device/Splint Interventions: Application, Adjustment   Kristopher Oppenheim 07/14/2017, 6:49 PM

## 2017-07-14 NOTE — ED Provider Notes (Signed)
Medical screening examination/treatment/procedure(s) were conducted as a shared visit with non-physician practitioner(s) and myself.  I personally evaluated the patient during the encounter.  Clinical Impression:   Final diagnoses:  Closed displaced fracture of distal phalanx of right thumb, initial encounter    Pt had fall out of chair - yesterday - hit her R thumb distal impact during fall Has no swelling on exam but has ttp - no subungual hematoma D/c ROM 2/2 pain. Imaging shows distal phalanx frx, Pt informed Static finger splint F/u with hand as outpatient Pt agreeable nsaids RICE.   Noemi Chapel, MD 07/15/17 [phone]

## 2017-07-14 NOTE — Discharge Instructions (Signed)
Please see the information and instructions below regarding your visit.  Your diagnoses today include:  1. Closed displaced fracture of distal phalanx of right thumb, initial encounter     Tests performed today include: The x-ray of your wrist showed no fractures. See side panel of your discharge paperwork for testing performed today. Vital signs are listed at the bottom of these instructions.   Medications prescribed:    You are prescribed Mobic, a non-steroidal anti-inflammatory agent (NSAID) for pain. You may take 7.40m every 12 hours as needed for pain. If still requiring this medication around the clock for acute pain after 10 days, please see your primary healthcare provider.  You may combine this medication with Tylenol, 650 mg every 6 hours, so you are receiving something for pain every 3 hours.  This is not a long-term medication unless under the care and direction of your primary provider. Taking this medication long-term and not under the supervision of a healthcare provider could increase the risk of stomach ulcers, kidney problems, and cardiovascular problems such as high blood pressure.   Take any prescribed medications only as prescribed, and any over the counter medications only as directed on the packaging.  Home care instructions:  Please follow any educational materials contained in this packet.   Follow-up instructions:  Please follow up with Dr. HMarcelino Scotin orthopedic surgery as soon as possible.  Return instructions:  Please return to the Emergency Department if you experience worsening symptoms.  Please return for any increasing swelling in your thumb, pain, loss of color to your thumb, worsening loss of sensation. Please return if you have any other emergent concerns.   Your vital signs today were: BP (!) 136/101 (BP Location: Right Arm)    Pulse 87    Temp 98.1 F (36.7 C) (Oral)    Resp 16    Ht 5' 8.5" (1.74 m)    Wt 84.8 kg (187 lb)    SpO2 99%    BMI 28.02  kg/m  If your blood pressure (BP) was elevated on multiple readings during this visit above 130 for the top number or above 80 for the bottom number, please have this repeated by your primary care provider within one month. --------------  Thank you for allowing [facility] participate in your care today.

## 2017-08-18 ENCOUNTER — Emergency Department (HOSPITAL_COMMUNITY)
Admission: EM | Admit: 2017-08-18 | Discharge: 2017-08-18 | Disposition: A | Discharge status: Home / Self Care | Payer: Self-pay | Attending: Emergency Medicine | Admitting: Emergency Medicine

## 2017-08-18 ENCOUNTER — Emergency Department (HOSPITAL_COMMUNITY): Payer: Self-pay

## 2017-08-18 ENCOUNTER — Encounter (HOSPITAL_COMMUNITY): Payer: Self-pay | Admitting: Emergency Medicine

## 2017-08-18 DIAGNOSIS — Z79899 Other long term (current) drug therapy: Secondary | ICD-10-CM | POA: Insufficient documentation

## 2017-08-18 DIAGNOSIS — I10 Essential (primary) hypertension: Secondary | ICD-10-CM | POA: Insufficient documentation

## 2017-08-18 DIAGNOSIS — F17210 Nicotine dependence, cigarettes, uncomplicated: Secondary | ICD-10-CM | POA: Insufficient documentation

## 2017-08-18 DIAGNOSIS — R0789 Other chest pain: Secondary | ICD-10-CM | POA: Insufficient documentation

## 2017-08-18 DIAGNOSIS — R51 Headache: Secondary | ICD-10-CM | POA: Insufficient documentation

## 2017-08-18 DIAGNOSIS — R519 Headache, unspecified: Secondary | ICD-10-CM

## 2017-08-18 DIAGNOSIS — J45909 Unspecified asthma, uncomplicated: Secondary | ICD-10-CM | POA: Insufficient documentation

## 2017-08-18 LAB — I-STAT TROPONIN, ED
TROPONIN I, POC: 0.03 ng/mL (ref 0.00–0.08)
Troponin i, poc: 0.03 ng/mL (ref 0.00–0.08)

## 2017-08-18 LAB — BASIC METABOLIC PANEL
Anion gap: 11 (ref 5–15)
BUN: 9 mg/dL (ref 6–20)
CALCIUM: 8.9 mg/dL (ref 8.9–10.3)
CO2: 19 mmol/L — ABNORMAL LOW (ref 22–32)
Chloride: 108 mmol/L (ref 101–111)
Creatinine, Ser: 0.83 mg/dL (ref 0.44–1.00)
GFR calc Af Amer: >60 mL/min (ref >60)
GLUCOSE: 78 mg/dL (ref 65–99)
POTASSIUM: 3.9 mmol/L (ref 3.5–5.1)
Sodium: 138 mmol/L (ref 135–145)

## 2017-08-18 LAB — CBC
HEMATOCRIT: 35.9 % — AB (ref 36.0–46.0)
Hemoglobin: 11.5 g/dL — ABNORMAL LOW (ref 12.0–15.0)
MCH: 28.5 pg (ref 26.0–34.0)
MCHC: 32 g/dL (ref 30.0–36.0)
MCV: 89.1 fL (ref 78.0–100.0)
PLATELETS: 265 10*3/uL (ref 150–400)
RBC: 4.03 MIL/uL (ref 3.87–5.11)
RDW: 15.1 % (ref 11.5–15.5)
WBC: 7.6 10*3/uL (ref 4.0–10.5)

## 2017-08-18 IMAGING — CT CT HEAD W/O CM
4 series · 16 of 47 positions shown, 18 images · non-contrast
Comparison: Head CT without contrast [DATE].

CLINICAL DATA: 50-year-old female with headache and dizziness onset
today. Fall last week.

EXAM:
CT HEAD WITHOUT CONTRAST
TECHNIQUE: Contiguous axial images were obtained from the base of the skull
through the vertex without intravenous contrast.

[Series 3: head wo · axial · 0.44mm/px · z∈[-98,+17]mm · 7 of 31 slices shown, 9 images]
[im 4/31  brain]
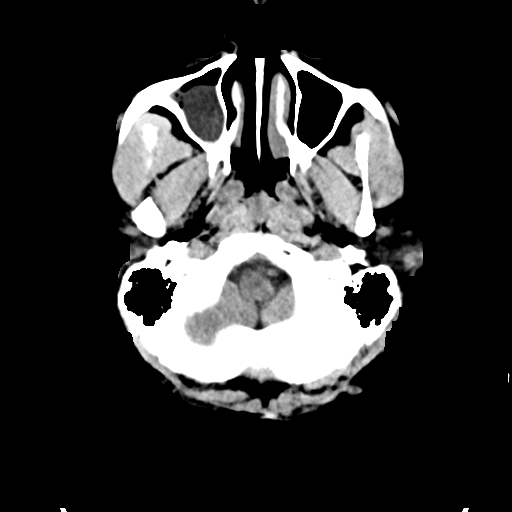
[im 4/31  bone]
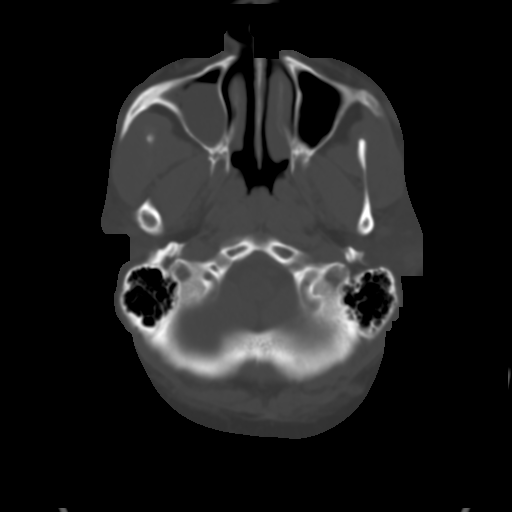
[im 8/31  brain]
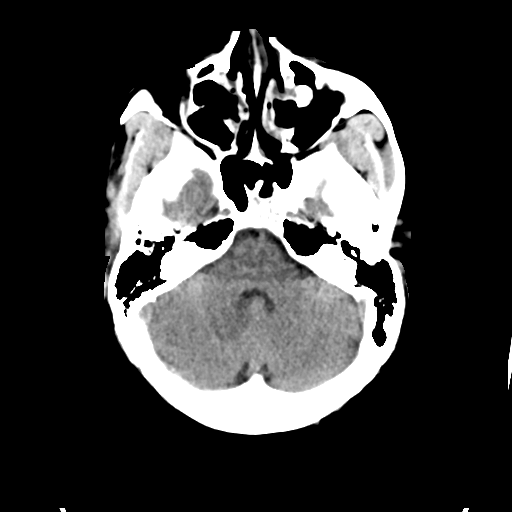
[im 12/31  brain]
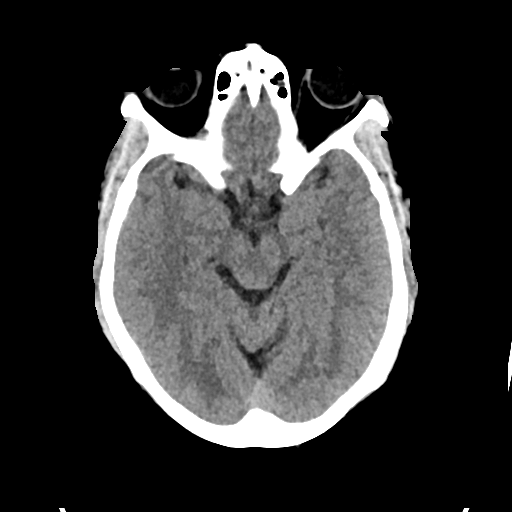
[im 16/31  brain]
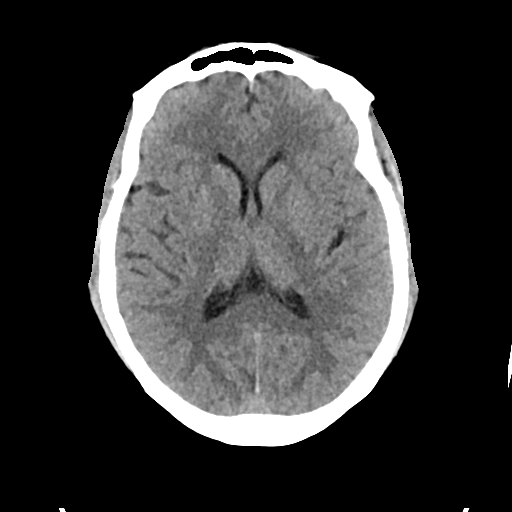
[im 19/31  brain]
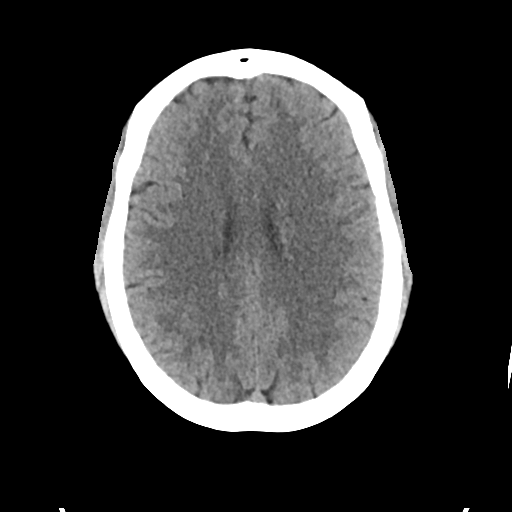
[im 19/31  bone]
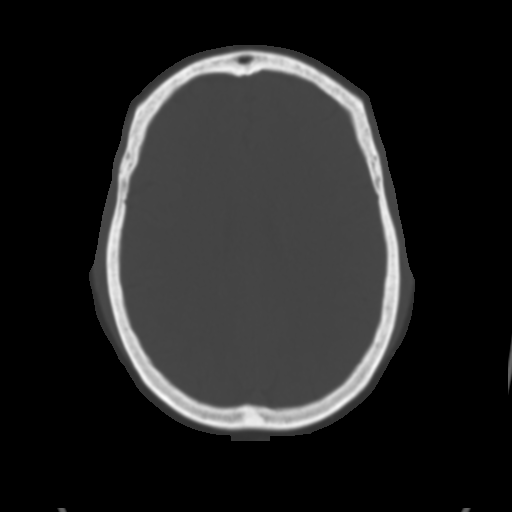
[im 23/31  brain]
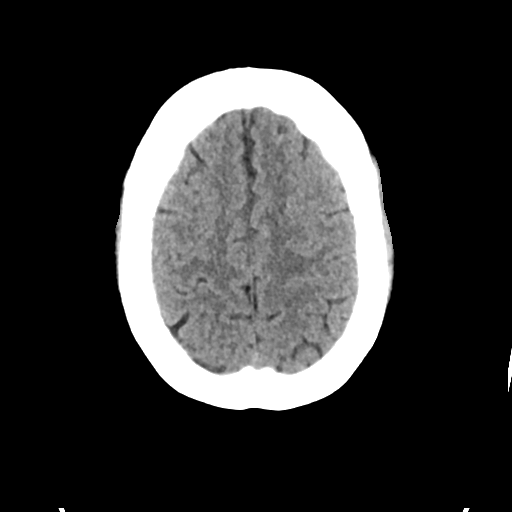
[im 27/31  brain]
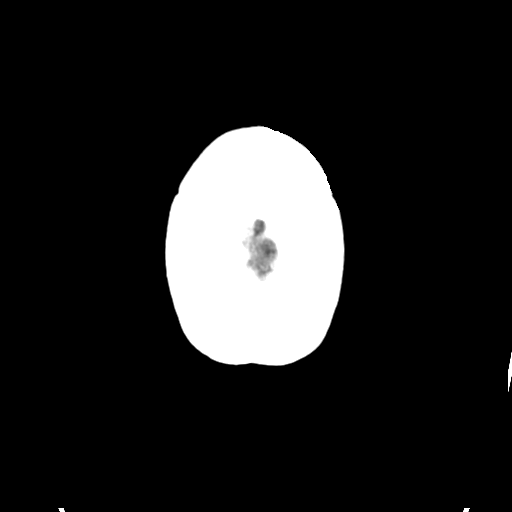

[Series 4: head bone · axial · 0.44mm/px · z∈[-99,-69]mm · 3 of 76 slices shown]
[im 8/76  bone]
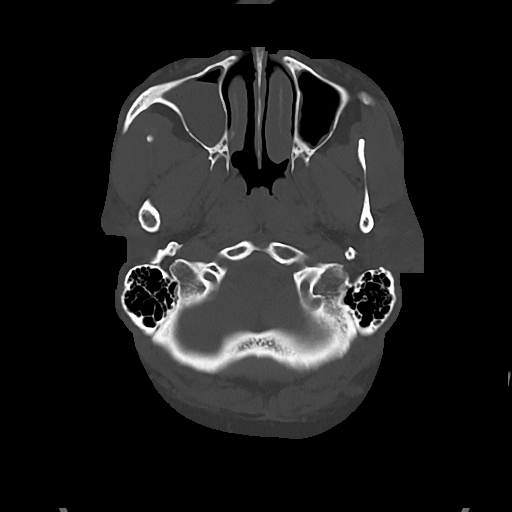
[im 16/76  bone]
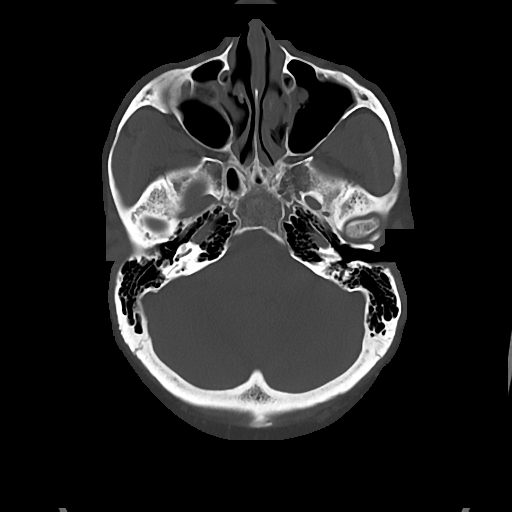
[im 23/76  bone]
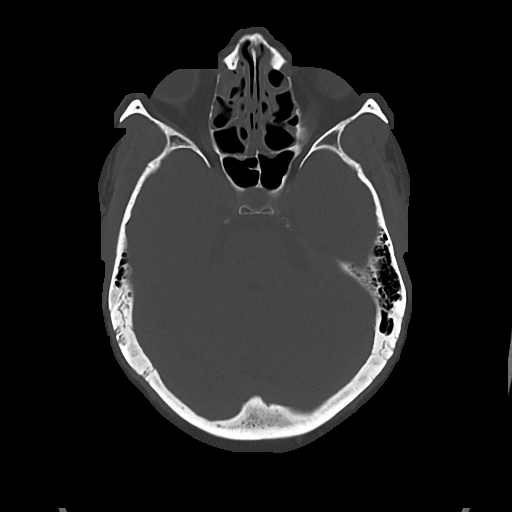

[Series 5: cor soft · coronal · 0.30mm/px · 3 of 67 slices shown]
[im 23/67  brain]
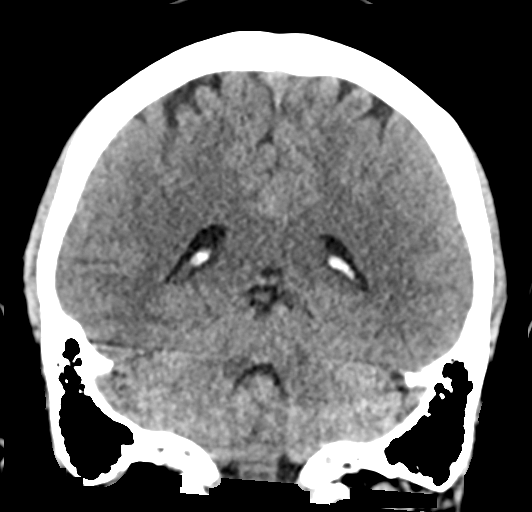
[im 30/67  brain]
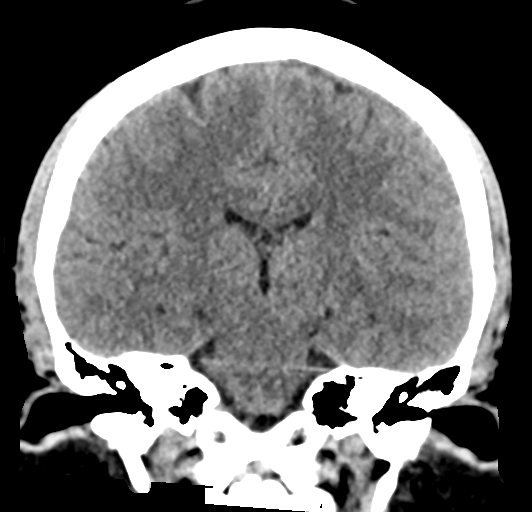
[im 37/67  brain]
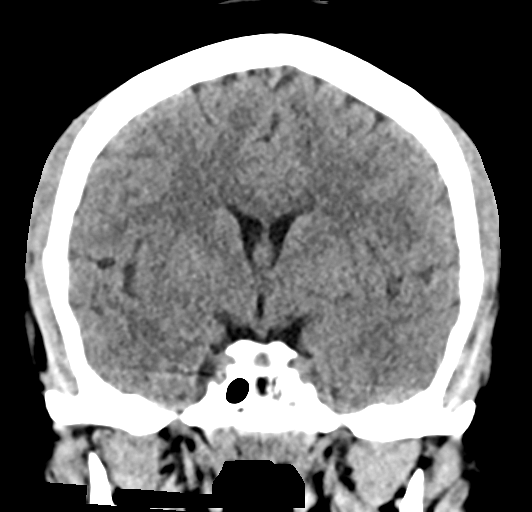

[Series 6: sag soft · sagittal · 0.29mm/px · 3 of 47 slices shown]
[im 16/47  brain]
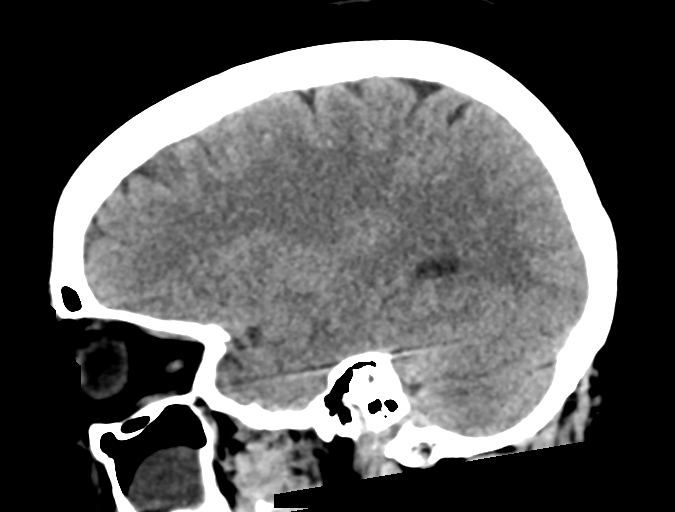
[im 24/47  brain]
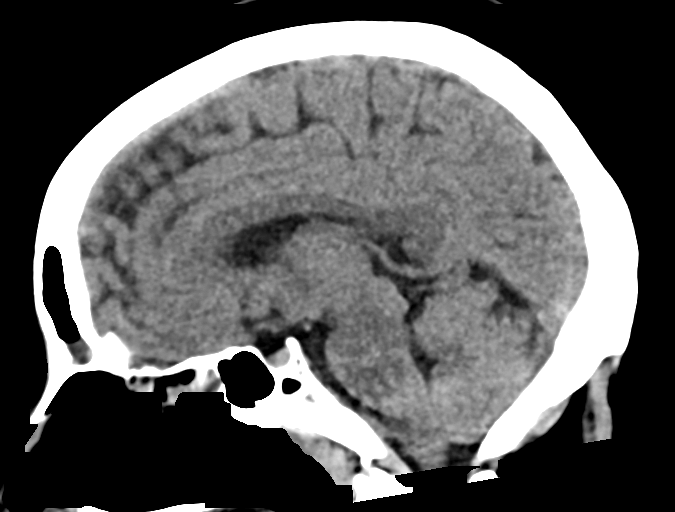
[im 31/47  brain]
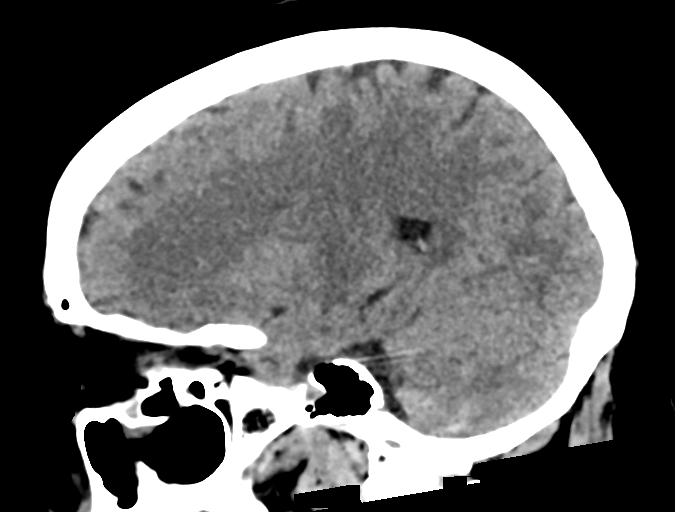

[16 of 47 positions shown; findings below may reference images not displayed]

FINDINGS: Brain: Cerebral volume remains normal. No midline shift,
ventriculomegaly, mass effect, evidence of mass lesion, intracranial
hemorrhage or evidence of cortically based acute infarction.
Gray-white matter differentiation is within normal limits throughout
the brain.

Vascular: No suspicious intracranial vascular hyperdensity.

Skull: No acute osseous abnormality identified.

Sinuses/Orbits: Chronic paranasal sinus mucosal thickening including
right maxillary mucous retention cyst. Stable sinus aeration.
Tympanic cavities, mastoids and petrous apex air cells remain clear.

Other: Mildly disc conjugate gaze. No other acute orbit soft tissue
finding. Visualized scalp soft tissues are within normal limits.
IMPRESSION: 1. Stable and normal Normal noncontrast CT appearance of the brain.
2. Chronic paranasal sinus disease has not significantly changed
since [DATE].

## 2017-08-18 MED ORDER — PROCHLORPERAZINE EDISYLATE 5 MG/ML IJ SOLN
10.0000 mg | Freq: Once | INTRAMUSCULAR | Status: AC
  Administered 2017-08-18: 10 mg via INTRAVENOUS
  Filled 2017-08-18: qty 2

## 2017-08-18 MED ORDER — SODIUM CHLORIDE 0.9 % IV BOLUS (SEPSIS)
1000.0000 mL | Freq: Once | INTRAVENOUS | Status: AC
  Administered 2017-08-18: 1000 mL via INTRAVENOUS

## 2017-08-18 MED ORDER — KETOROLAC TROMETHAMINE 30 MG/ML IJ SOLN
30.0000 mg | Freq: Once | INTRAMUSCULAR | Status: AC
  Administered 2017-08-18: 30 mg via INTRAVENOUS
  Filled 2017-08-18: qty 1

## 2017-08-18 MED ORDER — DIPHENHYDRAMINE HCL 50 MG/ML IJ SOLN
25.0000 mg | Freq: Once | INTRAMUSCULAR | Status: AC
  Administered 2017-08-18: 25 mg via INTRAVENOUS
  Filled 2017-08-18: qty 1

## 2017-08-18 NOTE — ED Notes (Signed)
Patient transported to CT 

## 2017-08-18 NOTE — ED Provider Notes (Signed)
[facility] DEPT Provider Note   CSN: [id number] Arrival date & time: 08/18/17  7425     History   Chief Complaint Chief Complaint  Patient presents with  . Chest Pain  . Headache    HPI Denise Knight is a 51 y.o. female.  HPI Patient presents to the emergency department withHeadache started last night.  The patient states that this morning when she was at work.  She also developed some chest discomfort was worse with certain movements and deep breathing.  Patient has also had some nausea associated with headache States this does not feel similar to her cardiac chest pain.  The patient's main complaint seems to be the headache.  The patient states that seems make her condition better.  She states that light makes the headache worse.  Patient states she does not normally get headaches. The patient denies shortness of breath,blurred vision, neck pain, fever, cough, weakness, numbness, dizziness, anorexia, edema, abdominal pain,vomiting, diarrhea, rash, back pain, dysuria, hematemesis, bloody stool, near syncope, or syncope. Past Medical History:  Diagnosis Date  . Anxiety   . Arthritis    "left knee" (07/05/2016)  . Asthma   . Bipolar 1 disorder ([place])   . Celiac disease    pt denies this hx on 07/05/2016  . Depression   . GERD (gastroesophageal reflux disease)   . Hypertension   . Post traumatic stress disorder   . Schizophrenia ([place])   . Stomach ulcer     Patient Active Problem List   Diagnosis Date Noted  . Acute viral bronchitis 06/14/2017  . Asthma exacerbation 06/14/2017  . Chest pain 07/05/2016    Past Surgical History:  Procedure Laterality Date  . CARDIAC CATHETERIZATION  07/05/2016  . CARDIAC CATHETERIZATION N/A 07/05/2016   Procedure: Left Heart Cath and Coronary Angiography;  Surgeon: Adrian Prows, MD;  Location: [facility];  Service: Cardiovascular;  Laterality: N/A;  . CYST EXCISION Right    "wrist"  . DILATION AND CURETTAGE OF UTERUS    . FOOT  SURGERY Bilateral    "corns removed"  . TUBAL LIGATION      OB History    No data available       Home Medications    Prior to Admission medications   Medication Sig Start Date End Date Taking? Authorizing Provider  acetaminophen (TYLENOL) 500 MG tablet Take 1,000 mg by mouth every 4 (four) hours as needed for moderate pain or headache.   Yes [provider]  albuterol (PROVENTIL HFA;VENTOLIN HFA) 108 (90 BASE) MCG/ACT inhaler Inhale 2 puffs into the lungs every 6 (six) hours as needed for wheezing or shortness of breath.   Yes [provider]  diclofenac sodium (VOLTAREN) 1 % GEL Apply 2 g topically 4 (four) times daily as needed (MSK pain associated with cough). 06/16/17  Yes Nedrud, Larena Glassman, MD  hydrochlorothiazide (HYDRODIURIL) 25 MG tablet Take 25 mg by mouth daily.   Yes [provider]  loratadine (CLARITIN) 10 MG tablet Take 1 tablet (10 mg total) by mouth daily. 06/16/17  Yes Nedrud, Larena Glassman, MD  omeprazole (PRILOSEC) 20 MG capsule Take 20 mg by mouth daily.    Yes [provider]  guaiFENesin-codeine 100-10 MG/5ML syrup Take 5 mLs by mouth every 4 (four) hours as needed for cough. Patient not taking: Reported on 08/18/2017 06/16/17   Thomasene Ripple, MD  meloxicam (MOBIC) 7.5 MG tablet Take 1 tablet (7.5 mg total) by mouth daily. Patient not taking: Reported on 08/18/2017 07/14/17  Valere Dross, Alyssa B, PA-C  nicotine (NICODERM CQ - DOSED IN MG/24 HR) 7 mg/24hr patch Place 1 patch (7 mg total) onto the skin daily. Patient not taking: Reported on 08/18/2017 06/17/17   Thomasene Ripple, MD  oxyCODONE-acetaminophen (PERCOCET) 5-325 MG tablet Take 1-2 tablets by mouth every 6 (six) hours as needed. Patient not taking: Reported on 08/18/2017 07/10/17   Little, Wenda Overland, MD    Family History Family History  Problem Relation Age of Onset  . Breast cancer Unknown   . Lung cancer Unknown   . Congestive Heart Failure Unknown   . Diabetes Unknown   .  Hypertension Unknown     Social History Social History  Substance Use Topics  . Smoking status: Current Some Day Smoker    Packs/day: 0.25    Years: 35.00    Types: Cigarettes  . Smokeless tobacco: Never Used  . Alcohol use 4.2 oz/week    7 Cans of beer per week     Allergies   Aspirin and Food   Review of Systems Review of Systems All other systems negative except as documented in the HPI. All pertinent positives and negatives as reviewed in the HPI.  Physical Exam Updated Vital Signs BP 124/85   Pulse 75   Temp 97.9 F (36.6 C) (Oral)   Resp 15   Ht 5' 8"  (1.727 m)   Wt 78.5 kg (173 lb)   SpO2 98%   BMI 26.30 kg/m   Physical Exam  Constitutional: She is oriented to person, place, and time. She appears well-developed and well-nourished. No distress.  HENT:  Head: Normocephalic and atraumatic.  Mouth/Throat: Oropharynx is clear and moist.  Eyes: Pupils are equal, round, and reactive to light.  Neck: Normal range of motion. Neck supple.  Cardiovascular: Normal rate, regular rhythm and normal heart sounds.  Exam reveals no gallop and no friction rub.   No murmur heard. Pulmonary/Chest: Effort normal and breath sounds normal. No respiratory distress. She has no wheezes.  Abdominal: Soft. Bowel sounds are normal. She exhibits no distension. There is no tenderness.  Neurological: She is alert and oriented to person, place, and time. She exhibits normal muscle tone. Coordination normal.  Skin: Skin is warm and dry. Capillary refill takes less than 2 seconds. No rash noted. No erythema.  Psychiatric: She has a normal mood and affect. Her behavior is normal.  Nursing note and vitals reviewed.    ED Treatments / Results  Labs (all labs ordered are listed, but only abnormal results are displayed) Labs Reviewed  BASIC METABOLIC PANEL - Abnormal; Notable for the following:       Result Value   CO2 19 (*)    All other components within normal limits  CBC - Abnormal;  Notable for the following:    Hemoglobin 11.5 (*)    HCT 35.9 (*)    All other components within normal limits  I-STAT TROPONIN, ED  I-STAT TROPONIN, ED    EKG  EKG Interpretation  Date/Time:  Monday August 18 2017 08:42:48 EDT Ventricular Rate:  83 PR Interval:    QRS Duration: 79 QT Interval:  381 QTC Calculation: 448 R Axis:   31 Text Interpretation:  Sinus rhythm No significant change since last tracing Confirmed by Blanchie Dessert [phone]) on 08/18/2017 10:11:27 AM       Radiology Dg Chest 2 View  Result Date: 08/18/2017 CLINICAL DATA:  Chest pain EXAM: CHEST  2 VIEW COMPARISON:  None. FINDINGS: Normal heart size. Lungs clear.  No pneumothorax. No pleural effusion. IMPRESSION: No active cardiopulmonary disease. Electronically Signed   By: Marybelle Killings M.D.   On: 08/18/2017 09:45   Ct Head Wo Contrast  Result Date: 08/18/2017 CLINICAL DATA:  51 year old female with headache and dizziness onset today. Fall last week. EXAM: CT HEAD WITHOUT CONTRAST TECHNIQUE: Contiguous axial images were obtained from the base of the skull through the vertex without intravenous contrast. COMPARISON:  Head CT without contrast 10/20/2015. FINDINGS: Brain: Cerebral volume remains normal. No midline shift, ventriculomegaly, mass effect, evidence of mass lesion, intracranial hemorrhage or evidence of cortically based acute infarction. Gray-white matter differentiation is within normal limits throughout the brain. Vascular: No suspicious intracranial vascular hyperdensity. Skull: No acute osseous abnormality identified. Sinuses/Orbits: Chronic paranasal sinus mucosal thickening including right maxillary mucous retention cyst. Stable sinus aeration. Tympanic cavities, mastoids and petrous apex air cells remain clear. Other: Mildly disc conjugate gaze. No other acute orbit soft tissue finding. Visualized scalp soft tissues are within normal limits. IMPRESSION: 1. Stable and normal Normal noncontrast CT  appearance of the brain. 2. Chronic paranasal sinus disease has not significantly changed since 2016. Electronically Signed   By: Genevie Ann M.D.   On: 08/18/2017 12:22    Procedures Procedures (including critical care time)  Medications Ordered in ED Medications  sodium chloride 0.9 % bolus 1,000 mL (0 mLs Intravenous Stopped 08/18/17 1319)  prochlorperazine (COMPAZINE) injection 10 mg (10 mg Intravenous Given 08/18/17 1100)  diphenhydrAMINE (BENADRYL) injection 25 mg (25 mg Intravenous Given 08/18/17 1100)  ketorolac (TORADOL) 30 MG/ML injection 30 mg (30 mg Intravenous Given 08/18/17 1319)     Initial Impression / Assessment and Plan / ED Course  I have reviewed the triage vital signs and the nursing notes.  Pertinent labs & imaging results that were available during my care of the patient were reviewed by me and considered in my medical decision making (see chart for details).    Patient has atypical chest pain and does not feel similar to her previous cardiac events.  Patient's headache is resolved with pain medications and fluids.  Told to follow up with her cardiologist, also has primary care follow-up arranged for her as well along with medications  Final Clinical Impressions(s) / ED Diagnoses   Final diagnoses:  None    New Prescriptions New Prescriptions   No medications on file     Dalia Heading, Hershal Coria 08/18/17 [place], MD 08/18/17 2044

## 2017-08-18 NOTE — Discharge Instructions (Signed)
Return here as needed.  Follow-up with the clinic provided.  Also need to see your cardiologist

## 2017-08-18 NOTE — ED Triage Notes (Addendum)
Per [facility] patient coming from work today complaining of a headache that began last night at 10pm, blurred vision x3 days, and central chest pain that hurts when patient takes a deep breath that began this morning at 0730. Patient had a MI in August 2018 with no stent placement. Patient also reports a productive cough x2 weeks with green sputum. Patient alert and oriented x4. No neuro deficits with ems. Patient allergic to aspirin, none given by EMS. EKG unremarkable with ems

## 2017-08-18 NOTE — ED Notes (Signed)
Pt returned from CT °

## 2017-08-18 NOTE — ED Notes (Signed)
Patient transported to X-ray 

## 2017-09-08 ENCOUNTER — Inpatient Hospital Stay (INDEPENDENT_AMBULATORY_CARE_PROVIDER_SITE_OTHER): Payer: Self-pay | Admitting: Physician Assistant

## 2017-10-29 ENCOUNTER — Other Ambulatory Visit: Payer: Self-pay

## 2017-10-29 ENCOUNTER — Emergency Department (HOSPITAL_COMMUNITY): Payer: Self-pay

## 2017-10-29 ENCOUNTER — Encounter (HOSPITAL_COMMUNITY): Payer: Self-pay

## 2017-10-29 ENCOUNTER — Emergency Department (HOSPITAL_COMMUNITY)
Admission: EM | Admit: 2017-10-29 | Discharge: 2017-10-29 | Disposition: A | Discharge status: Home / Self Care | Payer: Self-pay | Attending: Emergency Medicine | Admitting: Emergency Medicine

## 2017-10-29 DIAGNOSIS — J45909 Unspecified asthma, uncomplicated: Secondary | ICD-10-CM | POA: Insufficient documentation

## 2017-10-29 DIAGNOSIS — R42 Dizziness and giddiness: Secondary | ICD-10-CM

## 2017-10-29 DIAGNOSIS — Z79899 Other long term (current) drug therapy: Secondary | ICD-10-CM | POA: Insufficient documentation

## 2017-10-29 DIAGNOSIS — R1084 Generalized abdominal pain: Secondary | ICD-10-CM

## 2017-10-29 DIAGNOSIS — I10 Essential (primary) hypertension: Secondary | ICD-10-CM | POA: Insufficient documentation

## 2017-10-29 DIAGNOSIS — F17210 Nicotine dependence, cigarettes, uncomplicated: Secondary | ICD-10-CM | POA: Insufficient documentation

## 2017-10-29 DIAGNOSIS — K625 Hemorrhage of anus and rectum: Secondary | ICD-10-CM | POA: Insufficient documentation

## 2017-10-29 DIAGNOSIS — K6289 Other specified diseases of anus and rectum: Secondary | ICD-10-CM | POA: Insufficient documentation

## 2017-10-29 HISTORY — DX: Nonrheumatic mitral (valve) insufficiency: I34.0

## 2017-10-29 LAB — COMPREHENSIVE METABOLIC PANEL
ALT: 26 U/L (ref 14–54)
ANION GAP: 8 (ref 5–15)
AST: 28 U/L (ref 15–41)
Albumin: 3.8 g/dL (ref 3.5–5.0)
Alkaline Phosphatase: 64 U/L (ref 38–126)
BILIRUBIN TOTAL: 0.7 mg/dL (ref 0.3–1.2)
BUN: 15 mg/dL (ref 6–20)
CHLORIDE: 106 mmol/L (ref 101–111)
CO2: 25 mmol/L (ref 22–32)
Calcium: 9.4 mg/dL (ref 8.9–10.3)
Creatinine, Ser: 1.08 mg/dL — ABNORMAL HIGH (ref 0.44–1.00)
GFR, EST NON AFRICAN AMERICAN: 58 mL/min — AB (ref >60)
Glucose, Bld: 93 mg/dL (ref 65–99)
POTASSIUM: 3.7 mmol/L (ref 3.5–5.1)
Sodium: 139 mmol/L (ref 135–145)
TOTAL PROTEIN: 7.5 g/dL (ref 6.5–8.1)

## 2017-10-29 LAB — URINALYSIS, ROUTINE W REFLEX MICROSCOPIC
Bilirubin Urine: NEGATIVE
Glucose, UA: NEGATIVE mg/dL
KETONES UR: NEGATIVE mg/dL
Nitrite: NEGATIVE
PH: 6 (ref 5.0–8.0)
PROTEIN: NEGATIVE mg/dL
Specific Gravity, Urine: 1.044 — ABNORMAL HIGH (ref 1.005–1.030)

## 2017-10-29 LAB — I-STAT BETA HCG BLOOD, ED (MC, WL, AP ONLY): I-stat hCG, quantitative: <5 m[IU]/mL (ref <5)

## 2017-10-29 LAB — CBC
HEMATOCRIT: 39.6 % (ref 36.0–46.0)
Hemoglobin: 13 g/dL (ref 12.0–15.0)
MCH: 29.5 pg (ref 26.0–34.0)
MCHC: 32.8 g/dL (ref 30.0–36.0)
MCV: 90 fL (ref 78.0–100.0)
Platelets: 273 10*3/uL (ref 150–400)
RBC: 4.4 MIL/uL (ref 3.87–5.11)
RDW: 14.4 % (ref 11.5–15.5)
WBC: 11 10*3/uL — AB (ref 4.0–10.5)

## 2017-10-29 LAB — ABO/RH: ABO/RH(D): O NEG

## 2017-10-29 LAB — TYPE AND SCREEN
ABO/RH(D): O NEG
ANTIBODY SCREEN: NEGATIVE

## 2017-10-29 LAB — I-STAT TROPONIN, ED: Troponin i, poc: 0.01 ng/mL (ref 0.00–0.08)

## 2017-10-29 LAB — POC OCCULT BLOOD, ED: FECAL OCCULT BLD: POSITIVE — AB

## 2017-10-29 IMAGING — CT CT ABD-PELV W/ CM
2 of 6 series · 16 of 46 positions shown, 18 images · IV contrast (ISOVUE)
Comparison: CT scan dated [DATE]

CLINICAL DATA: Lower abdominal pain.  Rectal bleeding.

EXAM:
CT ABDOMEN AND PELVIS WITH CONTRAST
TECHNIQUE: Multidetector CT imaging of the abdomen and pelvis was performed
using the standard protocol following bolus administration of
intravenous contrast.
CONTRAST:  100mL [N0] IOPAMIDOL ([N0]) INJECTION 61%

[Series 2: axial st · axial · 0.80mm/px · z∈[+724,+1124]mm · 13 of 92 slices shown, 15 images]
[im 6/92  soft-tissue]
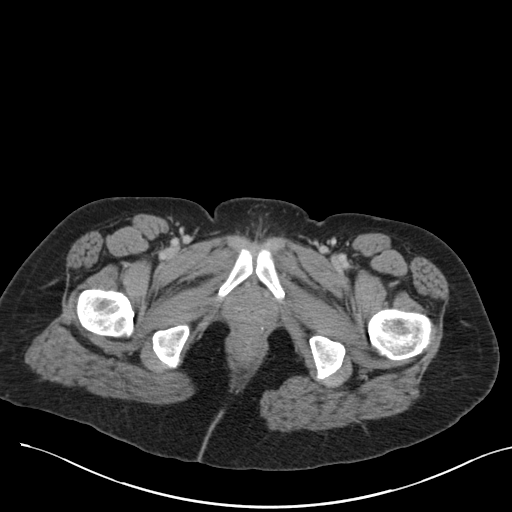
[im 6/92  bone]
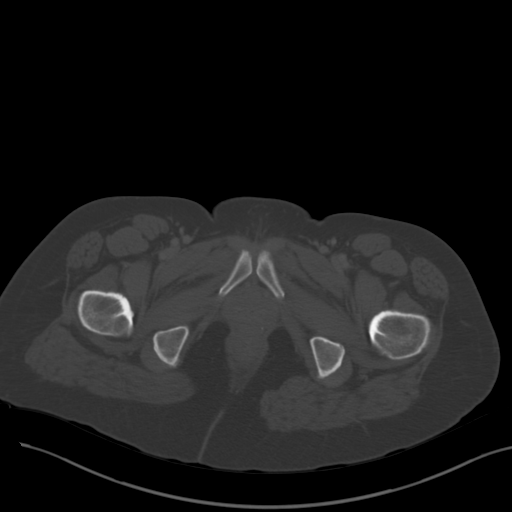
[im 11/92  soft-tissue]
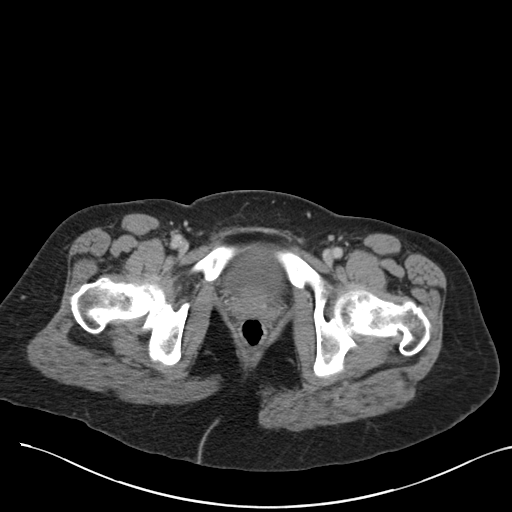
[im 22/92  soft-tissue]
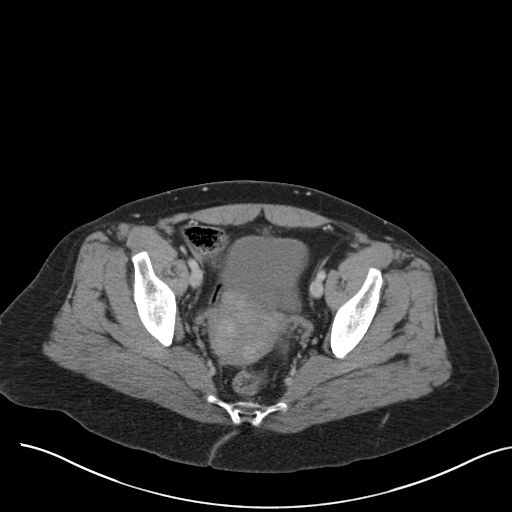
[im 27/92  soft-tissue]
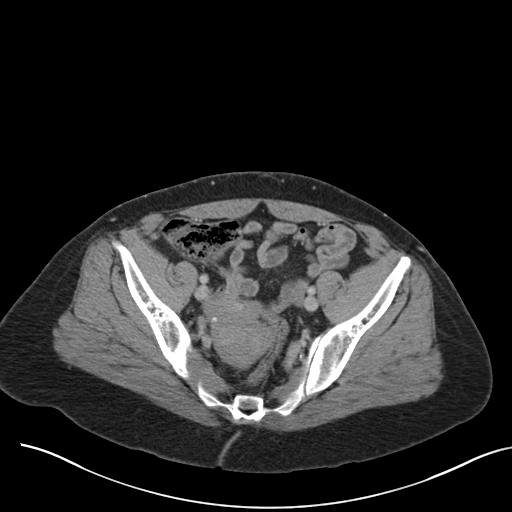
[im 33/92  soft-tissue]
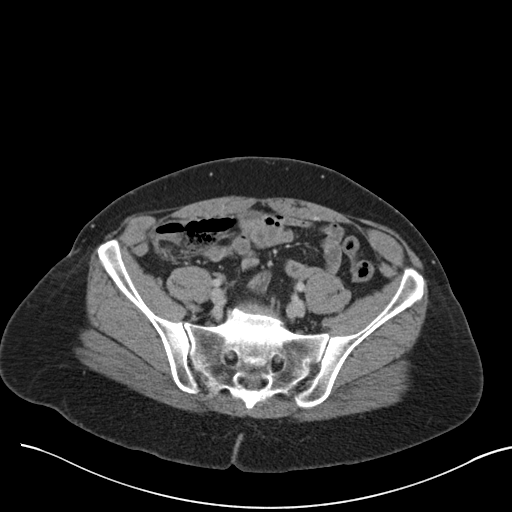
[im 38/92  soft-tissue]
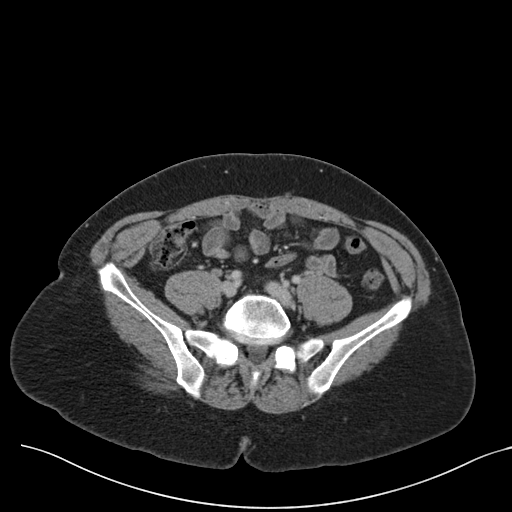
[im 49/92  soft-tissue]
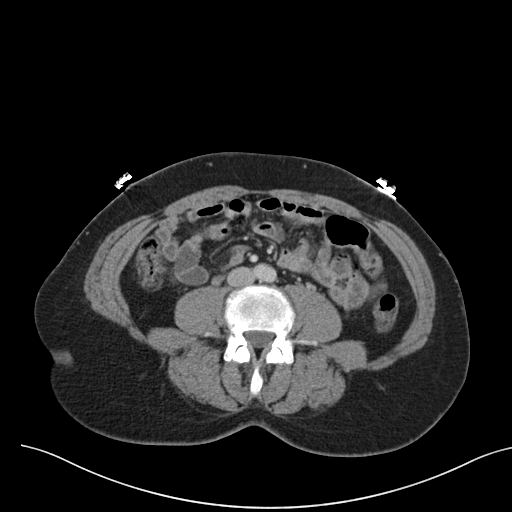
[im 54/92  soft-tissue]
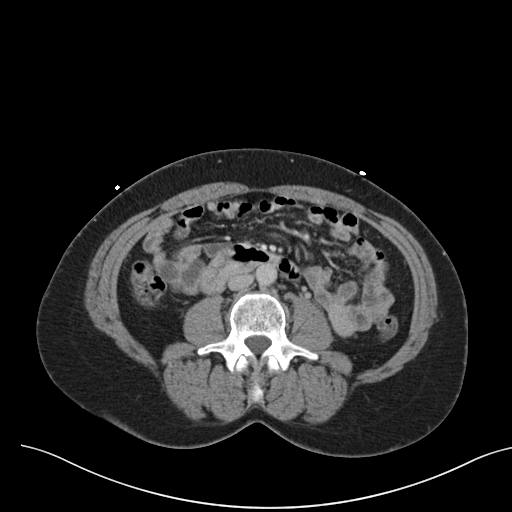
[im 59/92  soft-tissue]
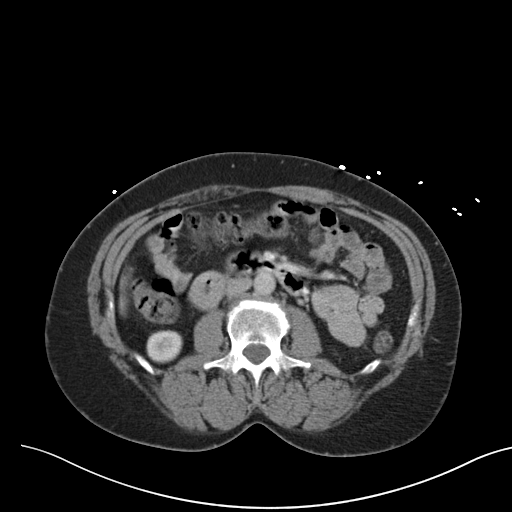
[im 59/92  bone]
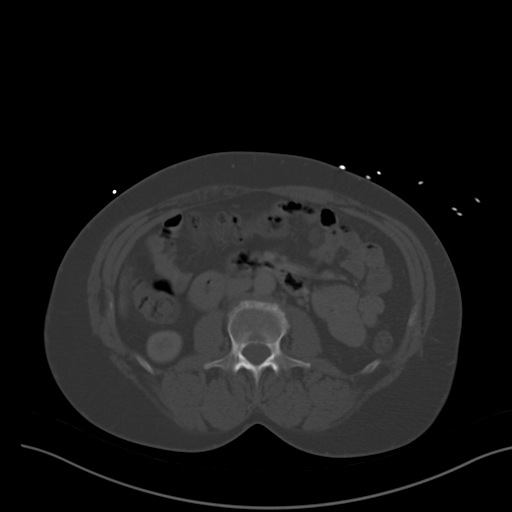
[im 65/92  soft-tissue]
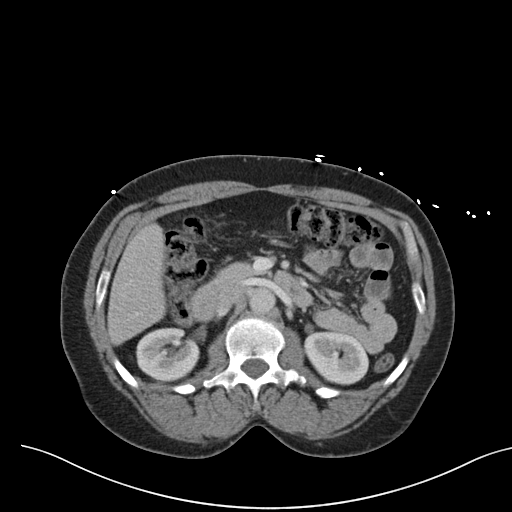
[im 70/92  soft-tissue]
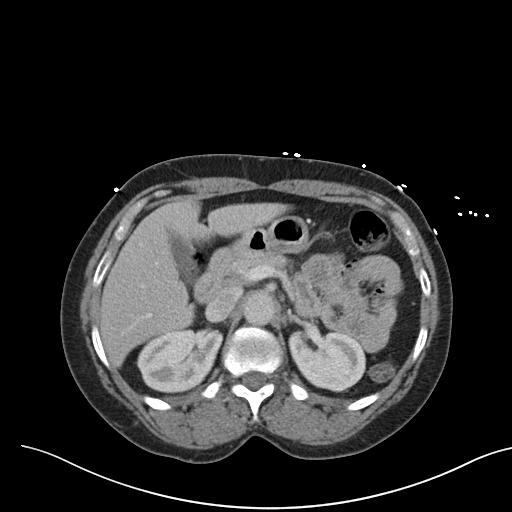
[im 81/92  soft-tissue]
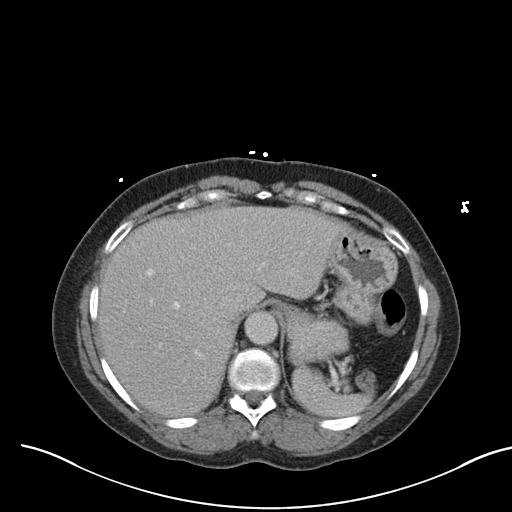
[im 86/92  soft-tissue]
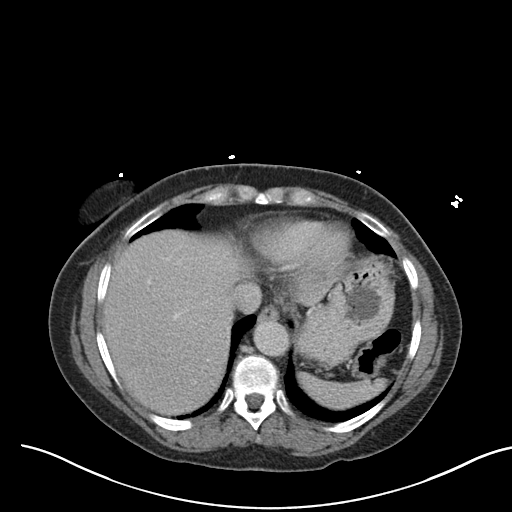

[Series 7: coronal st · coronal · 0.68mm/px · 3 of 92 slices shown]
[im 31/92  soft-tissue]
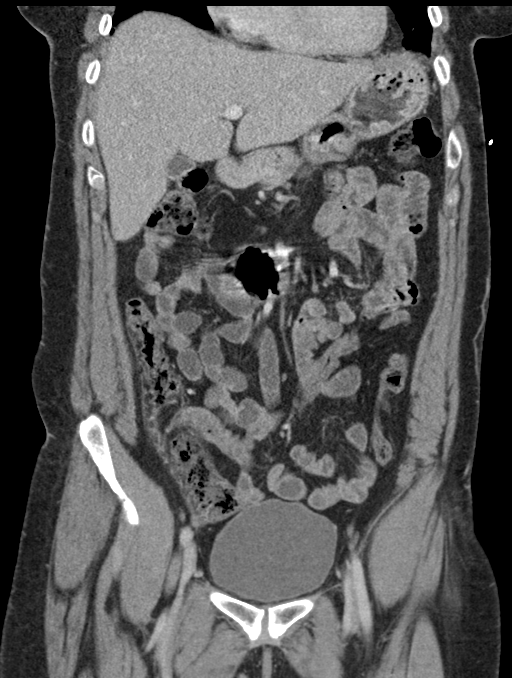
[im 41/92  soft-tissue]
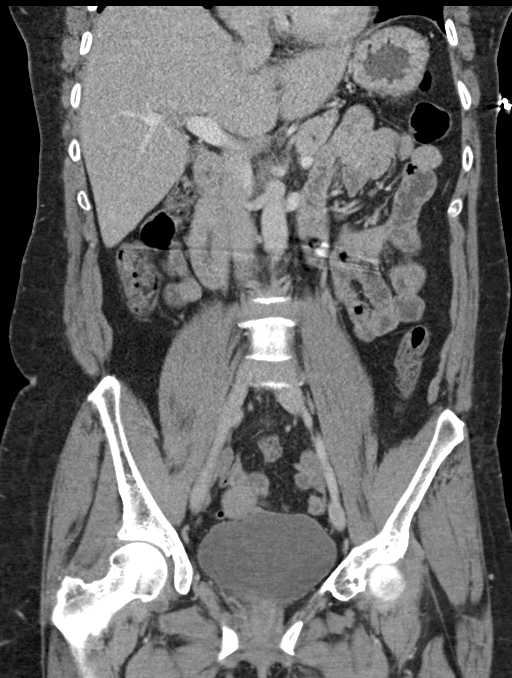
[im 51/92  soft-tissue]
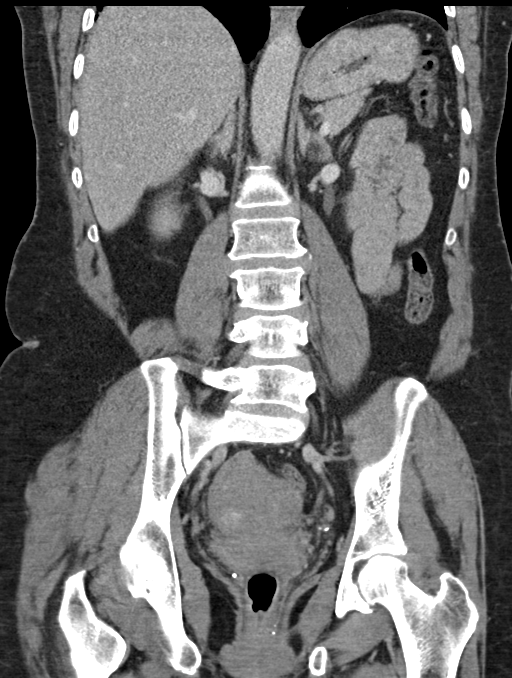

[16 of 46 positions shown; findings below may reference images not displayed]

FINDINGS: Lower chest: Slight atelectasis at the right lung base.

Hepatobiliary: Small area of focal fatty infiltration at the
anterior aspect of the left lobe adjacent to the falciform ligament.
Liver parenchyma is otherwise normal. Biliary tree is normal.

Pancreas: Unremarkable. No pancreatic ductal dilatation or
surrounding inflammatory changes.

Spleen: Single tiny calcified granuloma.  Otherwise normal.

Adrenals/Urinary Tract: Adrenal glands are unremarkable. Kidneys are
normal, without renal calculi, focal lesion, or hydronephrosis.
Bladder is unremarkable.

Stomach/Bowel: Stomach is within normal limits. Appendix appears
normal. No evidence of bowel wall thickening, distention, or
inflammatory changes.

Vascular/Lymphatic: No significant vascular findings are present. No
enlarged abdominal or pelvic lymph nodes.

Reproductive: Multiple fibroids in the uterus.  Ovaries are normal.

Other: Tiny unchanged umbilical hernia containing only fat. No
abdominopelvic ascites.

Musculoskeletal: Moderately severe arthritis of the right hip.
Degenerative disc disease at L5-S1.
IMPRESSION: No acute abnormalities.

## 2017-10-29 MED ORDER — MORPHINE SULFATE (PF) 4 MG/ML IV SOLN
4.0000 mg | Freq: Once | INTRAVENOUS | Status: AC
  Administered 2017-10-29: 4 mg via INTRAVENOUS
  Filled 2017-10-29: qty 1

## 2017-10-29 MED ORDER — HYDROCORTISONE 2.5 % RE CREA: TOPICAL_CREAM | RECTAL | 0 refills | Status: DC

## 2017-10-29 MED ORDER — SODIUM CHLORIDE 0.9 % IV BOLUS (SEPSIS)
1000.0000 mL | Freq: Once | INTRAVENOUS | Status: AC
  Administered 2017-10-29: 1000 mL via INTRAVENOUS

## 2017-10-29 MED ORDER — LIDOCAINE (ANORECTAL) 5 % EX GEL: CUTANEOUS | 0 refills | Status: DC

## 2017-10-29 MED ORDER — IOPAMIDOL (ISOVUE-300) INJECTION 61%
INTRAVENOUS | Status: AC
  Administered 2017-10-29: 100 mL via INTRAVENOUS
  Filled 2017-10-29: qty 100

## 2017-10-29 NOTE — ED Provider Notes (Signed)
[facility] DEPT Provider Note   CSN: [id number] Arrival date & time: 10/29/17  1213     History   Chief Complaint Chief Complaint  Patient presents with  . Near Syncope  . Headache  . Rectal Bleeding  . Abdominal Pain    HPI Denise Knight is a 51 y.o. female.  HPI 51 year old African-American female past medical history significant for anxiety, bipolar disorder, celiac disease, hypertension, schizophrenia, stomach ulcers presents to the emergency department today for evaluation of rectal bleeding/pain and right lower quadrant abdominal pain.  Patient complains of rectal pain.  Rates the pain a 10 out of 10.  Patient states that she woke up this morning had a bowel movement with frank red blood.  States that she went to work and had a second bowel movement with blood as well.  Patient reports intermittent history of this in the past but is never been this much.  She also reports having some blood on her toilet paper when she wiped over the past 2-3 days.  Reports some loose stools today.  Patient also notes right lower quadrant abdominal pain.  Describes a sharp and cramping in nature.  Pain is intermittent.  She is not take anything for the pain prior to arrival.  Denies any prior history of same.  Patient states she had a colonoscopy approximately 10 years ago that showed 3 polyps that were removed that were benign.  Patient also had the same problem last year was seen in the ED.  Follow with GI.  GI recommended a colonoscopy which patient did not follow-up with.  At that time she was diagnosed with hemorrhoids.  Patient reports some nausea and 2 episodes of emesis that were not bloody.  Patient denies any chronic NSAID use.  Reports daily alcohol use.  Denies any associated urinary symptoms or vaginal bleeding or discharge.  Patient does report some dizziness after having a second bowel movement at work today.  She denies any at this time.  Denies any  associated lightheadedness, headache, vision changes, chest pain, shortness of breath.   Patient had recent admission several months ago for atypical chest pain.  Patient denies any cardiac history.  Last echo in heart cath was unremarkable during her admission.  Denies any history of PE/DVT, prolonged immobilization or recent hospitalizations or surgeries, unilateral leg swelling, calf tenderness, hemoptysis, hormone use.  Pt denies any fever, chill, ha, vision changes, lightheadedness, congestion, neck pain, cp, sob, cough,  urinary symptoms, change in bowel habits, lower extremity paresthesias.  Past Medical History:  Diagnosis Date  . Anxiety   . Arthritis    "left knee" (07/05/2016)  . Asthma   . Bipolar 1 disorder ([place])   . Celiac disease    pt denies this hx on 07/05/2016  . Depression   . GERD (gastroesophageal reflux disease)   . Hypertension   . MI (mitral incompetence)   . Post traumatic stress disorder   . Schizophrenia ([place])   . Stomach ulcer     Patient Active Problem List   Diagnosis Date Noted  . Acute viral bronchitis 06/14/2017  . Asthma exacerbation 06/14/2017  . Chest pain 07/05/2016    Past Surgical History:  Procedure Laterality Date  . CARDIAC CATHETERIZATION  07/05/2016  . CARDIAC CATHETERIZATION N/A 07/05/2016   Procedure: Left Heart Cath and Coronary Angiography;  Surgeon: Adrian Prows, MD;  Location: [facility];  Service: Cardiovascular;  Laterality: N/A;  . CYST EXCISION Right    "  wrist"  . DILATION AND CURETTAGE OF UTERUS    . FOOT SURGERY Bilateral    "corns removed"  . TUBAL LIGATION      OB History    No data available       Home Medications    Prior to Admission medications   Medication Sig Start Date End Date Taking? Authorizing Provider  acetaminophen (TYLENOL) 500 MG tablet Take 1,000 mg by mouth every 4 (four) hours as needed for moderate pain or headache.   Yes [provider]  loratadine (CLARITIN) 10 MG tablet  Take 1 tablet (10 mg total) by mouth daily. 06/16/17  Yes Nedrud, Larena Glassman, MD  albuterol (PROVENTIL HFA;VENTOLIN HFA) 108 (90 BASE) MCG/ACT inhaler Inhale 2 puffs into the lungs every 6 (six) hours as needed for wheezing or shortness of breath.    [provider]  diclofenac sodium (VOLTAREN) 1 % GEL Apply 2 g topically 4 (four) times daily as needed (MSK pain associated with cough). Patient not taking: Reported on 10/29/2017 06/16/17   Thomasene Ripple, MD  guaiFENesin-codeine 100-10 MG/5ML syrup Take 5 mLs by mouth every 4 (four) hours as needed for cough. Patient not taking: Reported on 08/18/2017 06/16/17   Thomasene Ripple, MD  hydrochlorothiazide (HYDRODIURIL) 25 MG tablet Take 25 mg by mouth daily.    [provider]  meloxicam (MOBIC) 7.5 MG tablet Take 1 tablet (7.5 mg total) by mouth daily. Patient not taking: Reported on 08/18/2017 07/14/17   Langston Masker B, PA-C  nicotine (NICODERM CQ - DOSED IN MG/24 HR) 7 mg/24hr patch Place 1 patch (7 mg total) onto the skin daily. Patient not taking: Reported on 08/18/2017 06/17/17   Thomasene Ripple, MD  omeprazole (PRILOSEC) 20 MG capsule Take 20 mg by mouth daily.     [provider]  oxyCODONE-acetaminophen (PERCOCET) 5-325 MG tablet Take 1-2 tablets by mouth every 6 (six) hours as needed. Patient not taking: Reported on 08/18/2017 07/10/17   Little, Wenda Overland, MD    Family History Family History  Problem Relation Age of Onset  . Breast cancer Unknown   . Lung cancer Unknown   . Congestive Heart Failure Unknown   . Diabetes Unknown   . Hypertension Unknown     Social History Social History   Tobacco Use  . Smoking status: Current Some Day Smoker    Packs/day: 0.25    Years: 35.00    Pack years: 8.75    Types: Cigarettes  . Smokeless tobacco: Never Used  Substance Use Topics  . Alcohol use: Yes    Alcohol/week: 4.2 oz    Types: 7 Cans of beer per week  . Drug use: No    Comment: last used cocaine 2014      Allergies   Aspirin and Food   Review of Systems Review of Systems  Constitutional: Negative for chills and fever.  HENT: Negative for congestion.   Eyes: Negative for visual disturbance.  Respiratory: Negative for cough and shortness of breath.   Cardiovascular: Negative for chest pain.  Gastrointestinal: Positive for abdominal pain, blood in stool, diarrhea, nausea and vomiting. Negative for abdominal distention.  Genitourinary: Negative for dysuria, flank pain, frequency, hematuria, pelvic pain, urgency, vaginal bleeding and vaginal discharge.  Musculoskeletal: Negative for arthralgias and myalgias.  Skin: Negative for rash.  Neurological: Positive for dizziness. Negative for syncope, weakness, light-headedness, numbness and headaches.  Psychiatric/Behavioral: Negative for sleep disturbance. The patient is not nervous/anxious.      Physical Exam Updated Vital Signs  BP 127/85 (BP Location: Left Arm)   Pulse 90   Temp 97.8 F (36.6 C) (Oral)   Resp 16   Ht 5' 8.5" (1.74 m)   Wt 77.1 kg (170 lb)   SpO2 97%   BMI 25.47 kg/m   Physical Exam  Constitutional: She is oriented to person, place, and time. She appears well-developed and well-nourished.  Non-toxic appearance. No distress.  HENT:  Head: Normocephalic and atraumatic.  Nose: Nose normal.  Mouth/Throat: Oropharynx is clear and moist.  Eyes: Conjunctivae are normal. Pupils are equal, round, and reactive to light. Right eye exhibits no discharge. Left eye exhibits no discharge.  Neck: Normal range of motion. Neck supple.  Cardiovascular: Normal rate, regular rhythm, normal heart sounds and intact distal pulses. Exam reveals no gallop and no friction rub.  No murmur heard. Pulmonary/Chest: Effort normal and breath sounds normal. No stridor. No respiratory distress. She has no wheezes. She has no rales. She exhibits no tenderness.  Abdominal: Soft. Bowel sounds are normal. There is tenderness in the right lower  quadrant and suprapubic area. There is no rigidity, no rebound, no guarding, no CVA tenderness, no tenderness at McBurney's point and negative Murphy's sign.  Genitourinary:  Genitourinary Comments: Chaperone present for exam. Pt tolerated without difficulty. external hemorrhoids noted without any thrombosis.  No fissures noted.  pain with palpation of the rectal vault. No internal hemorrhoids noted. Soft brown stool noted in the rectal vault stool with pink tinge to it.  No gross hematochezia or clots.. No gross hematochezia or melena. Hemoccult positive.  Musculoskeletal: Normal range of motion. She exhibits no tenderness.  Lymphadenopathy:    She has no cervical adenopathy.  Neurological: She is alert and oriented to person, place, and time.  Patient alert and oriented x3.  Answers questions appropriately.  Speech is normal.  Cranial nerves II through XII grossly intact.  Grip strength is normal.  Moving all extremities any difficulties.  Skin: Skin is warm and dry. Capillary refill takes less than 2 seconds.  Psychiatric: Her behavior is normal. Judgment and thought content normal.  Nursing note and vitals reviewed.    ED Treatments / Results  Labs (all labs ordered are listed, but only abnormal results are displayed) Labs Reviewed  COMPREHENSIVE METABOLIC PANEL - Abnormal; Notable for the following components:      Result Value   Creatinine, Ser 1.08 (*)    GFR calc non Af Amer 58 (*)    All other components within normal limits  CBC - Abnormal; Notable for the following components:   WBC 11.0 (*)    All other components within normal limits  URINALYSIS, ROUTINE W REFLEX MICROSCOPIC - Abnormal; Notable for the following components:   Specific Gravity, Urine 1.044 (*)    Hgb urine dipstick MODERATE (*)    Leukocytes, UA TRACE (*)    Bacteria, UA RARE (*)    Squamous Epithelial / LPF 0-5 (*)    All other components within normal limits  POC OCCULT BLOOD, ED - Abnormal; Notable for  the following components:   Fecal Occult Bld POSITIVE (*)    All other components within normal limits  I-STAT BETA HCG BLOOD, ED ([facility], [facility], [facility] ONLY)  I-STAT TROPONIN, ED  TYPE AND SCREEN  ABO/RH    EKG  EKG Interpretation None       Radiology No results found.  Procedures Procedures (including critical care time)  Medications Ordered in ED Medications  sodium chloride 0.9 % bolus 1,000 mL (  not administered)     Initial Impression / Assessment and Plan / ED Course  I have reviewed the triage vital signs and the nursing notes.  Pertinent labs & imaging results that were available during my care of the patient were reviewed by me and considered in my medical decision making (see chart for details).     51 year old female presents to the ED for evaluation of bright red blood per rectum and lower abdominal pain that radiates to her flank.  States that she did have 2 episodes of bright red blood in her stool today.  Patient reports an episode of dizziness after her second bloody stool however denies any at this time.  Denies any associated chest pain, shortness of breath, fevers, urinary symptoms, vaginal symptoms.  Patient has history of same was diagnosed with hemorrhoids at that time.  She is followed up with a GI doctor who recommended a colonoscopy which patient has not followed up with.  On exam patient is overall well-appearing and nontoxic.  She is afebrile.  Lungs clear to auscultation bilaterally.  Abdominal exam is soft without any focal tenderness throughout.  No CVA tenderness.  Rectal exam shows several hemostatic external nonthrombosed hemorrhoids.  She has no obvious anal fissure.  I did not palpate any significant internal hemorrhoid.  Patient does have brown stool in the rectal vault with pink tinge to it.  No gross melena or hematochezia.  She is Hemoccult positive.  Patient has a negative orthostatic blood pressures.  She denies any lightheadedness or dizziness when  standing or ambulating.  CBC with a hemoglobin of 13 which is improved from patient's baseline.  Creatinine appears the patient's baseline.  BUN is normal.  UA with moderate hemoglobin without any signs of infection.  Patient denies any urinary symptoms.  Patient seems to have a hemoglobin in her urine at baseline.  This needs to be followed up with her primary care doctor.  White count is 11,000.  Electrolytes are unremarkable.  Troponin was negative.  EKG shows no ischemic changes with normal sinus rhythm and appears the patient's baseline.  CT scan of abdomen was performed.  This showed no acute abnormalities.  Feel the patient rectal pain and bleeding is due to hemorrhoids.  However cannot rule out any underlying pathology including ulcerative colitis.  Patient will need close follow-up with GI.  Patient has had no further episodes of GI bleed in the ED.  Her BUN and creatinine are normal.  Hemoglobin is stable.  Patient has no signs of acute blood loss including tachycardia or hypotension.  Repeat abdominal exam is benign.  Patient given fluids in the ED.  Patient does report some dizziness after second bowel movement today.  This is likely a vasovagal response.  Given normal troponin and EKG did not feel that this is related to ACS given that she had a normal workup with normal echocardiogram and heart cath.  Patient has had a history of same with hemorrhoids in the past with rectal bleeding.  Will give patient hydrocortisone cream and lidocaine cream.  Encouraged her to follow-up with GI and general surgery.  She also needs to follow with a primary care doctor to have her blood work and a urine recheck next week.  Have given her very strict return precautions.  Patient has asked several times if I will give her a work note.  She wants to be out of work for 2 days.  Pt is hemodynamically stable, in NAD, & able to  ambulate in the ED without any lightheadedness or dizziness.  Vital signs remained  reassuring.. Evaluation does not show pathology that would require ongoing emergent intervention or inpatient treatment. I explained the diagnosis to the patient. Pain has been managed & has no complaints prior to dc. Pt is comfortable with above plan and is stable for discharge at this time. All questions were answered prior to disposition. Strict return precautions for f/u to the ED were discussed. Encouraged follow up with PCP.   Final Clinical Impressions(s) / ED Diagnoses   Final diagnoses:  Rectal bleeding  Generalized abdominal pain  Rectal pain  Dizziness    ED Discharge Orders        Ordered    hydrocortisone (ANUSOL-HC) 2.5 % rectal cream     10/29/17 1912    Lidocaine, Anorectal, 5 % GEL     10/29/17 1912       Doristine Devoid, PA-C 10/29/17 2258    Lacretia Leigh, MD 10/30/17 [phone]

## 2017-10-29 NOTE — ED Triage Notes (Signed)
Per EMS- Patient was at work and states she became dizzy. Patient also c/o RLQ pain that radiates to the right flank and nausea.. Patient also reports that she has had 2 dark stools since feeling dizzy. Patient was given Zofran 4 mg ODT prior to arrival to the ED.

## 2017-10-29 NOTE — Discharge Instructions (Signed)
Your workup has been very reassuring in the ED.  Your hemoglobin is normal.  All of your blood work appears reassuring.  Your symptoms seem consistent with a hemorrhoid.  Have given you cream to use for your rectal pain.  Would also follow-up with GI doctor and a general surgeon if symptoms persist.  Your urine also had some blood cells in it.  Make sure you follow-up in 1 week to make sure that your urine clears.  Return to the ED with any worsening symptoms.

## 2017-12-18 ENCOUNTER — Emergency Department (HOSPITAL_COMMUNITY): Payer: Self-pay

## 2017-12-18 ENCOUNTER — Emergency Department (HOSPITAL_COMMUNITY)
Admission: EM | Admit: 2017-12-18 | Discharge: 2017-12-18 | Disposition: A | Discharge status: Home / Self Care | Payer: Self-pay | Attending: Emergency Medicine | Admitting: Emergency Medicine

## 2017-12-18 ENCOUNTER — Encounter (HOSPITAL_COMMUNITY): Payer: Self-pay | Admitting: *Deleted

## 2017-12-18 ENCOUNTER — Other Ambulatory Visit: Payer: Self-pay

## 2017-12-18 DIAGNOSIS — Z5321 Procedure and treatment not carried out due to patient leaving prior to being seen by health care provider: Secondary | ICD-10-CM | POA: Insufficient documentation

## 2017-12-18 DIAGNOSIS — R05 Cough: Secondary | ICD-10-CM | POA: Insufficient documentation

## 2017-12-18 LAB — CBC
HCT: 40.5 % (ref 36.0–46.0)
Hemoglobin: 13.1 g/dL (ref 12.0–15.0)
MCH: 28.8 pg (ref 26.0–34.0)
MCHC: 32.3 g/dL (ref 30.0–36.0)
MCV: 89 fL (ref 78.0–100.0)
PLATELETS: 277 10*3/uL (ref 150–400)
RBC: 4.55 MIL/uL (ref 3.87–5.11)
RDW: 14.6 % (ref 11.5–15.5)
WBC: 6.4 10*3/uL (ref 4.0–10.5)

## 2017-12-18 LAB — URINALYSIS, ROUTINE W REFLEX MICROSCOPIC
Bacteria, UA: NONE SEEN
Bilirubin Urine: NEGATIVE
GLUCOSE, UA: NEGATIVE mg/dL
Ketones, ur: NEGATIVE mg/dL
Nitrite: NEGATIVE
PH: 5 (ref 5.0–8.0)
PROTEIN: 100 mg/dL — AB
SPECIFIC GRAVITY, URINE: 1.025 (ref 1.005–1.030)

## 2017-12-18 LAB — COMPREHENSIVE METABOLIC PANEL
ALBUMIN: 3.6 g/dL (ref 3.5–5.0)
ALK PHOS: 58 U/L (ref 38–126)
ALT: 26 U/L (ref 14–54)
AST: 34 U/L (ref 15–41)
Anion gap: 15 (ref 5–15)
BILIRUBIN TOTAL: 0.3 mg/dL (ref 0.3–1.2)
BUN: 6 mg/dL (ref 6–20)
CALCIUM: 9.2 mg/dL (ref 8.9–10.3)
CO2: 19 mmol/L — AB (ref 22–32)
CREATININE: 0.95 mg/dL (ref 0.44–1.00)
Chloride: 104 mmol/L (ref 101–111)
GFR calc Af Amer: >60 mL/min (ref >60)
GFR calc non Af Amer: >60 mL/min (ref >60)
GLUCOSE: 103 mg/dL — AB (ref 65–99)
Potassium: 3.1 mmol/L — ABNORMAL LOW (ref 3.5–5.1)
SODIUM: 138 mmol/L (ref 135–145)
TOTAL PROTEIN: 6.8 g/dL (ref 6.5–8.1)

## 2017-12-18 LAB — LIPASE, BLOOD: Lipase: 22 U/L (ref 11–51)

## 2017-12-18 LAB — RAPID STREP SCREEN (MED CTR MEBANE ONLY): Streptococcus, Group A Screen (Direct): NEGATIVE

## 2017-12-18 IMAGING — CR DG CHEST 2V
2 series · 2 of 2 positions shown · non-contrast
Comparison: [DATE]

CLINICAL DATA: Cough for 1 week

EXAM:
CHEST  2 VIEW

[chest pa]
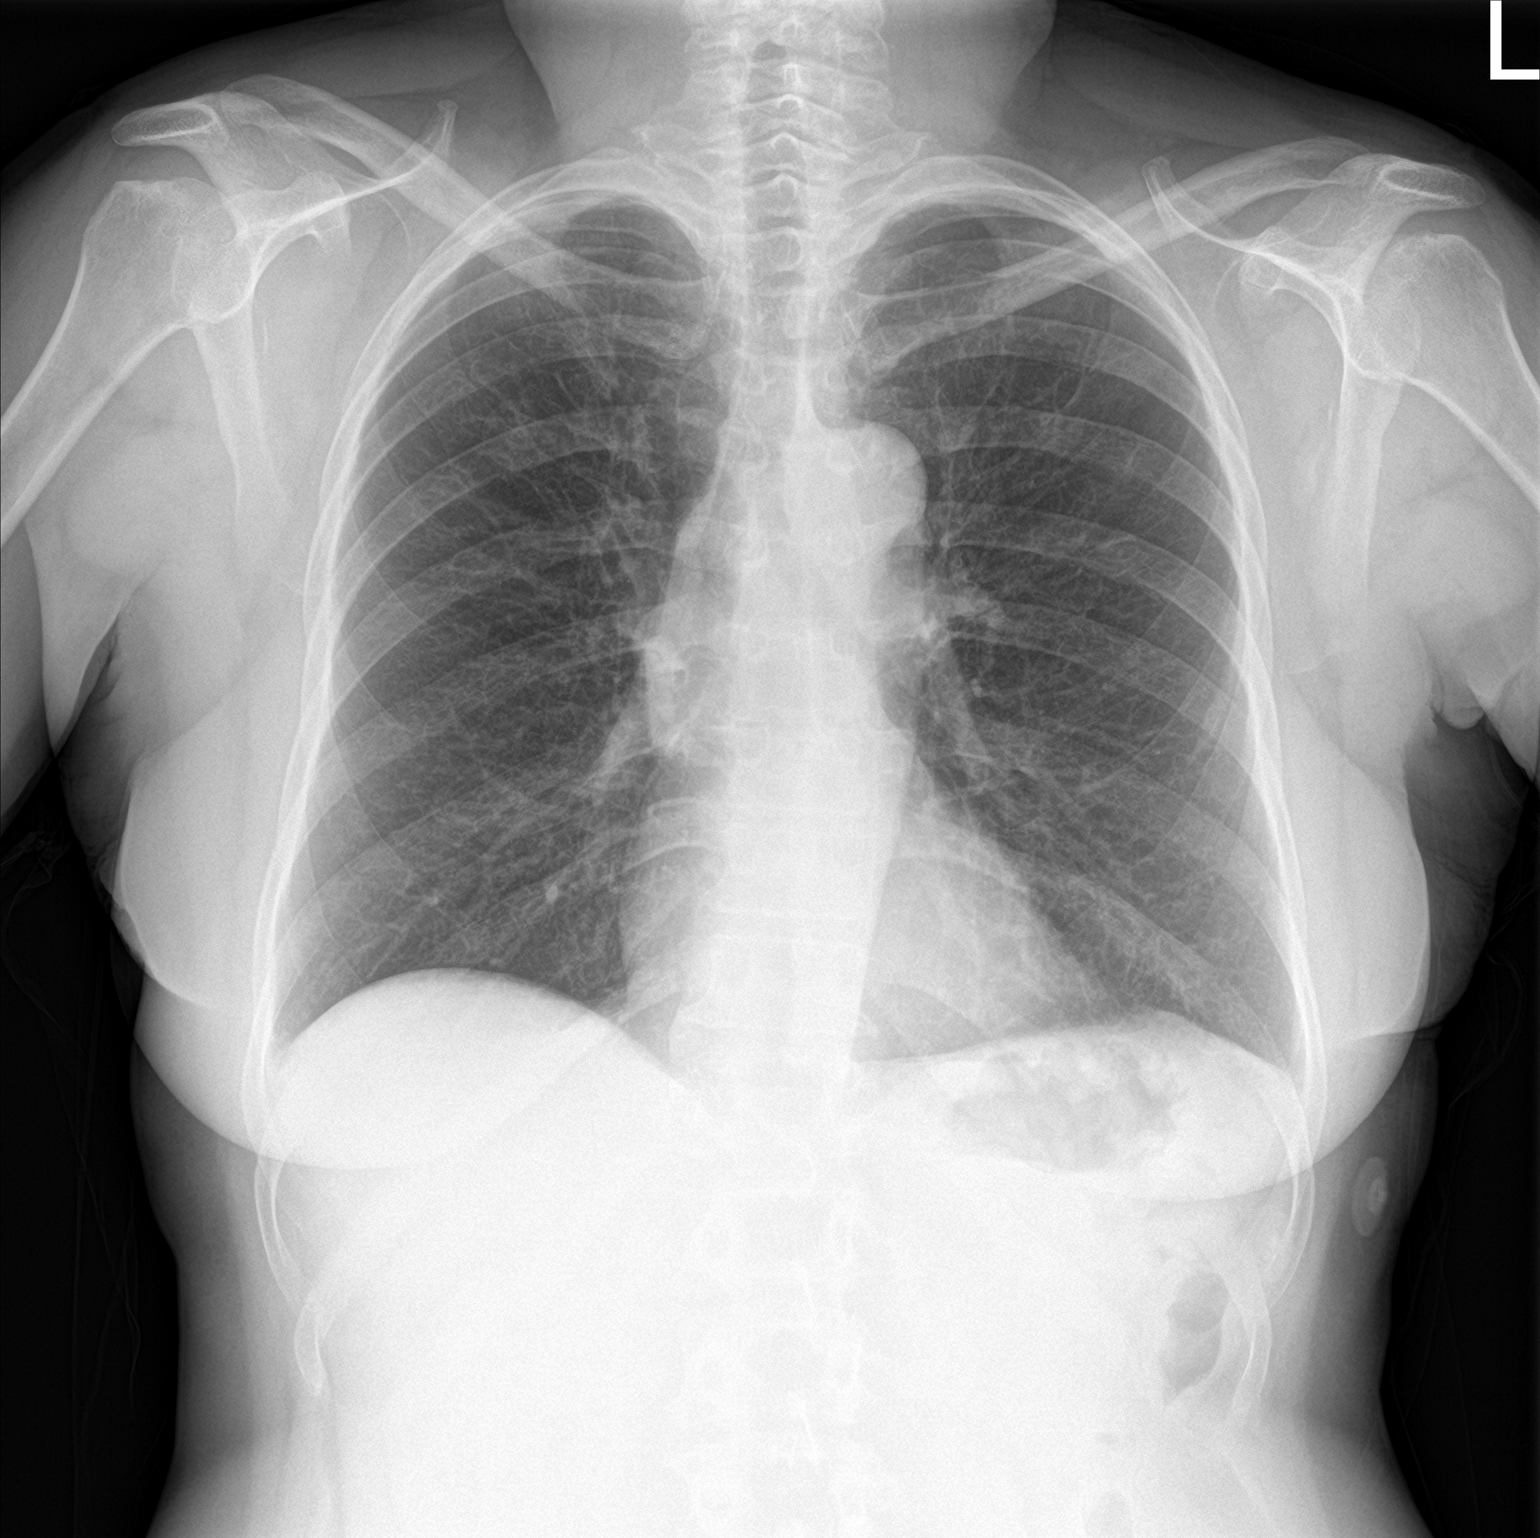

[chest lat]
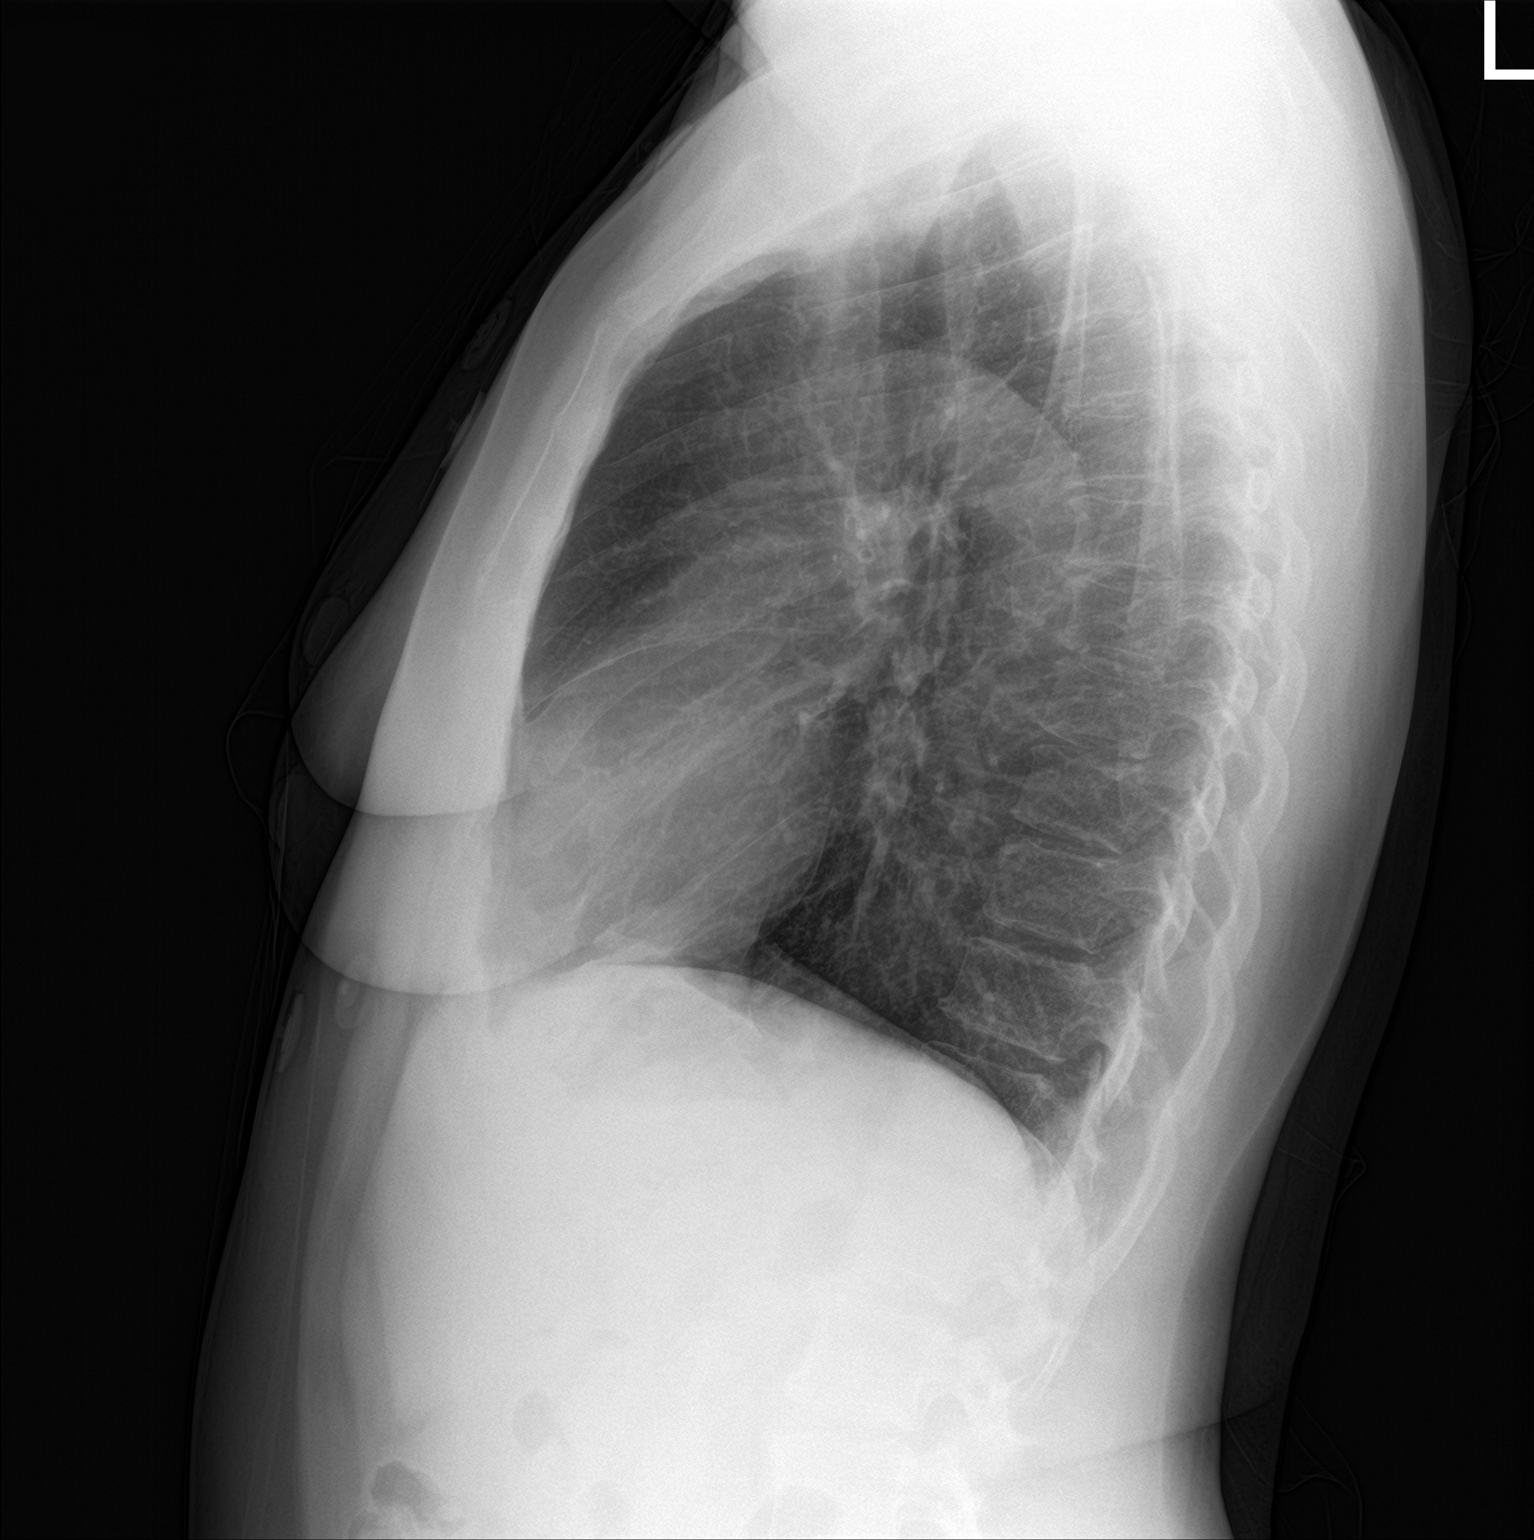

[2 of 2 positions shown; findings below may reference images not displayed]

FINDINGS: Heart and mediastinal contours are within normal limits. No focal
opacities or effusions. No acute bony abnormality.
IMPRESSION: No active cardiopulmonary disease.

## 2017-12-18 NOTE — ED Notes (Signed)
Pt not in lobby.  

## 2017-12-18 NOTE — ED Notes (Signed)
Pt now stating dizziness and chest pain.  Coughing.  Ekg completed.

## 2017-12-18 NOTE — ED Notes (Signed)
Called Pt for XR. No answer X3

## 2017-12-18 NOTE — ED Notes (Signed)
Called pt 3x for vitals recheck. No response.

## 2017-12-18 NOTE — ED Triage Notes (Signed)
Pt has dry cough for one week now and throat sore for 2 weeks and looked in mouth and saw red dot on back of right throat. NP cough.  Vomiting the last 2 days

## 2017-12-19 ENCOUNTER — Encounter (HOSPITAL_COMMUNITY): Payer: Self-pay | Admitting: Family Medicine

## 2017-12-19 ENCOUNTER — Ambulatory Visit (INDEPENDENT_AMBULATORY_CARE_PROVIDER_SITE_OTHER): Payer: Self-pay

## 2017-12-19 ENCOUNTER — Ambulatory Visit (HOSPITAL_COMMUNITY)
Admission: EM | Admit: 2017-12-19 | Discharge: 2017-12-19 | Disposition: A | Discharge status: Home / Self Care | Payer: Self-pay | Attending: Internal Medicine | Admitting: Internal Medicine

## 2017-12-19 DIAGNOSIS — J208 Acute bronchitis due to other specified organisms: Secondary | ICD-10-CM

## 2017-12-19 DIAGNOSIS — B9689 Other specified bacterial agents as the cause of diseases classified elsewhere: Secondary | ICD-10-CM

## 2017-12-19 IMAGING — DX DG CHEST 2V
2 series · 2 of 2 positions shown · non-contrast
Comparison: [DATE]

CLINICAL DATA: Cough, shortness of Breath

EXAM:
CHEST  2 VIEW

[chest pa]
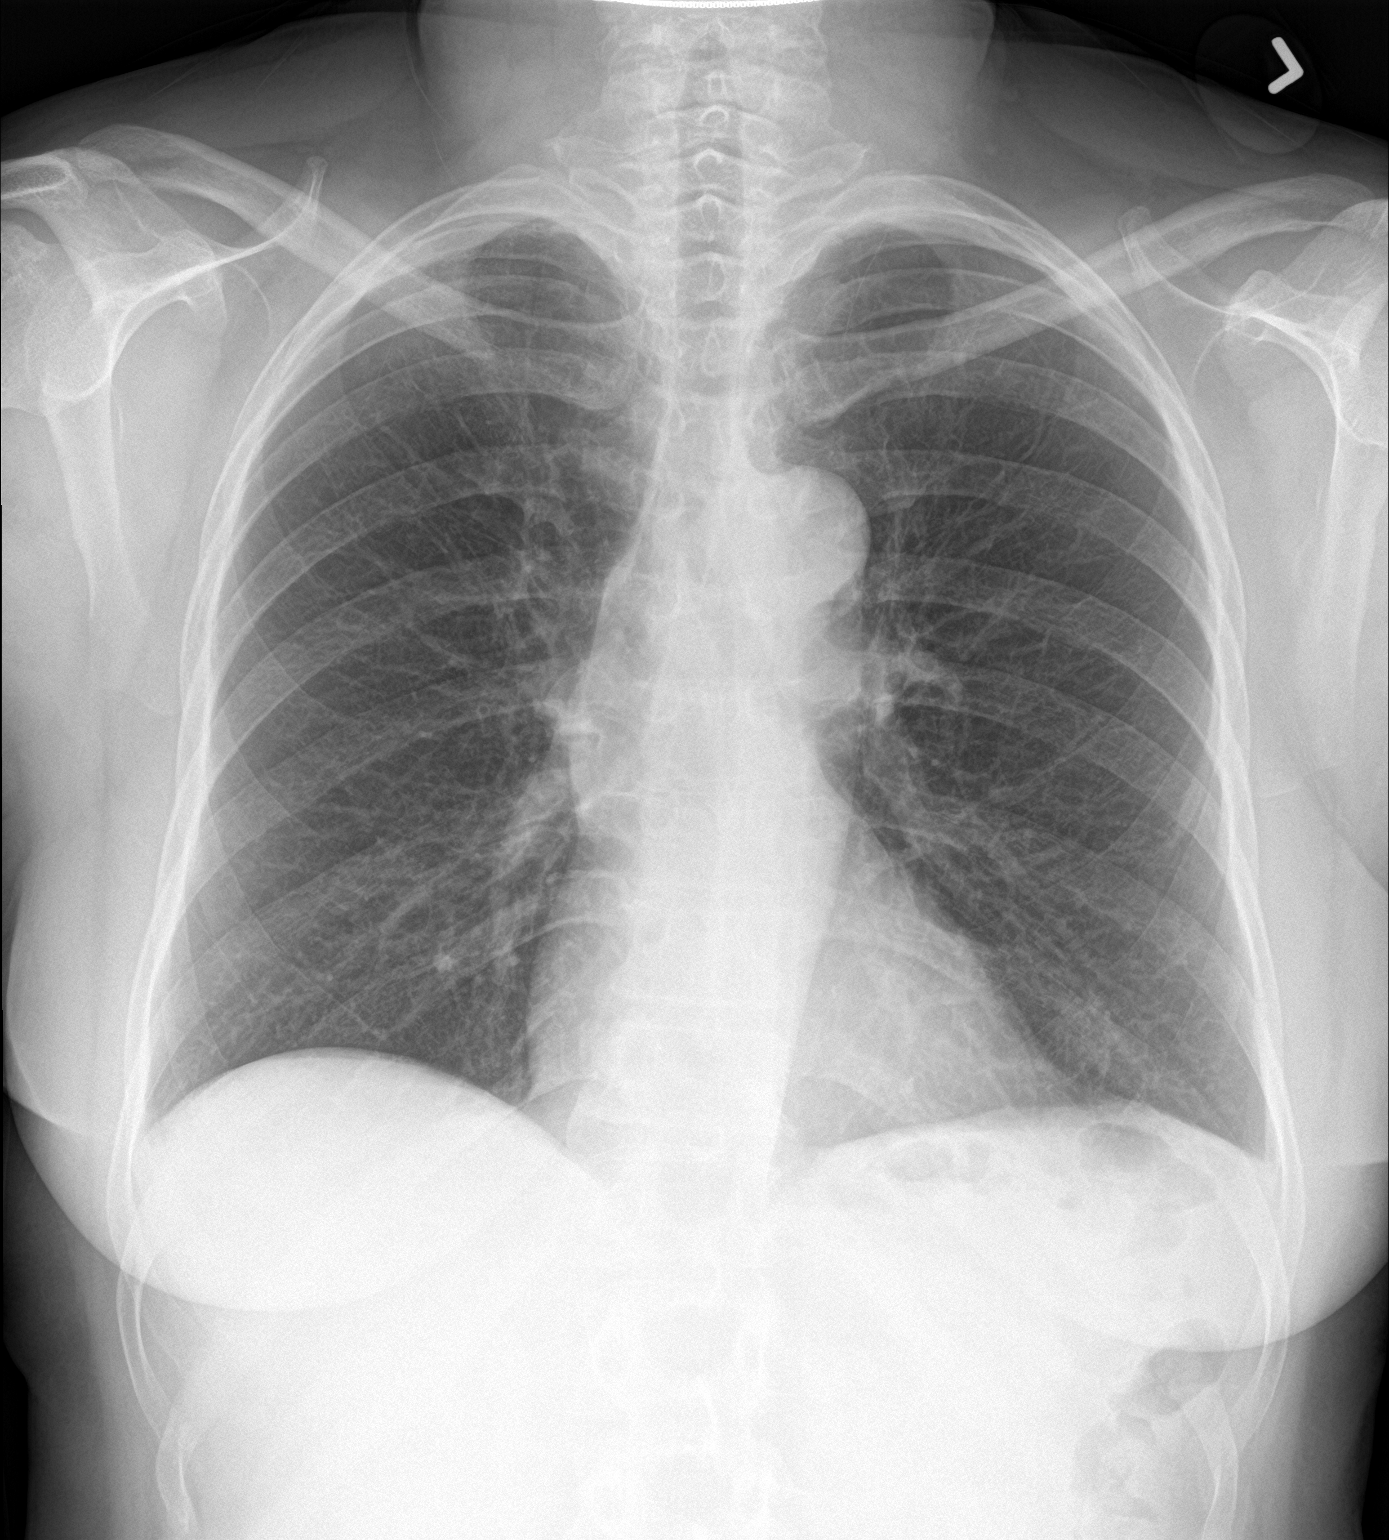

[chest lat]
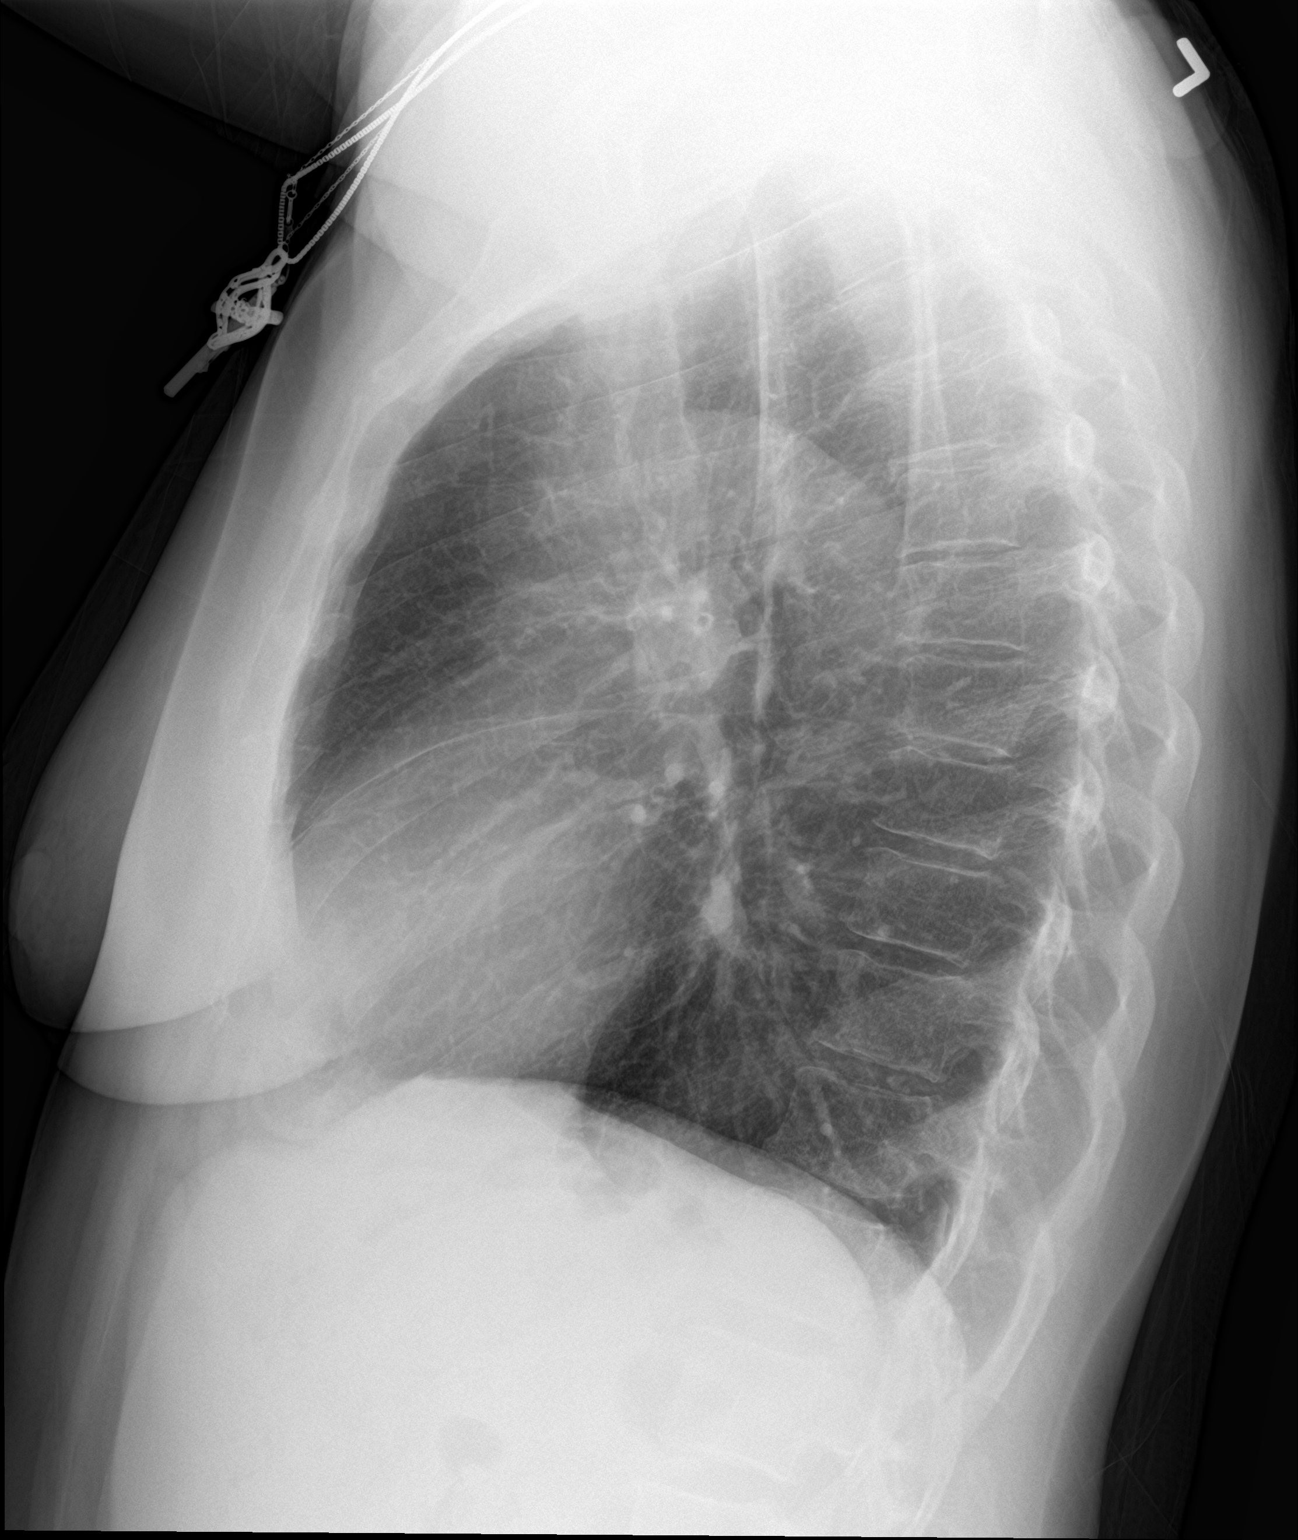

[2 of 2 positions shown; findings below may reference images not displayed]

FINDINGS: Mild peribronchial thickening. Heart and mediastinal contours are
within normal limits. No focal opacities or effusions. No acute bony
abnormality.
IMPRESSION: Mild bronchitic changes.

## 2017-12-19 MED ORDER — AZITHROMYCIN 250 MG PO TABS: 250.0000 mg | ORAL_TABLET | Freq: Every day | ORAL | 0 refills | Status: DC

## 2017-12-19 MED ORDER — BENZONATATE 100 MG PO CAPS: 100.0000 mg | ORAL_CAPSULE | Freq: Three times a day (TID) | ORAL | 0 refills | Status: DC

## 2017-12-19 MED ORDER — PREDNISONE 10 MG PO TABS: 40.0000 mg | ORAL_TABLET | Freq: Every day | ORAL | 0 refills | Status: DC

## 2017-12-19 NOTE — ED Provider Notes (Signed)
[place]    CSN: [id number] Arrival date & time: 12/19/17  1003     History   Chief Complaint Chief Complaint  Patient presents with  . Cough    HPI Denise Knight is a 52 y.o. female.   52 year old female, with history of anxiety, asthma, bipolar disorder, GERD, presenting today complaining of cough, shortness of breath and wheezing.  Patient states that her symptoms started Monday of this week.  She has had no fever or chills.  She is complaining of some small white dots to the right side of her throat as well.  She denies any sore throat.  Denies any headache, neck pain or stiffness, chest pain, abdominal pain, nausea or vomiting.   The history is provided by the patient.  Cough  Cough characteristics:  Non-productive Sputum characteristics:  Nondescript Severity:  Mild Onset quality:  Gradual Duration:  5 days Timing:  Constant Progression:  Unchanged Chronicity:  New Smoker: no   Context: sick contacts   Context: not animal exposure, not exposure to allergens, not fumes, not occupational exposure, not smoke exposure, not upper respiratory infection, not weather changes and not with activity   Relieved by:  Nothing Worsened by:  Nothing Ineffective treatments:  None tried Associated symptoms: shortness of breath and wheezing   Associated symptoms: no chest pain, no chills, no diaphoresis, no ear fullness, no ear pain, no eye discharge, no fever, no headaches, no myalgias, no rash, no rhinorrhea, no sinus congestion, no sore throat and no weight loss   Risk factors: no chemical exposure, no recent infection and no recent travel     Past Medical History:  Diagnosis Date  . Anxiety   . Arthritis    "left knee" (07/05/2016)  . Asthma   . Bipolar 1 disorder ([place])   . Celiac disease    pt denies this hx on 07/05/2016  . Depression   . GERD (gastroesophageal reflux disease)   . Hypertension   . MI (mitral incompetence)   . Post traumatic stress  disorder   . Schizophrenia ([place])   . Stomach ulcer     Patient Active Problem List   Diagnosis Date Noted  . Acute viral bronchitis 06/14/2017  . Asthma exacerbation 06/14/2017  . Chest pain 07/05/2016    Past Surgical History:  Procedure Laterality Date  . CARDIAC CATHETERIZATION  07/05/2016  . CARDIAC CATHETERIZATION N/A 07/05/2016   Procedure: Left Heart Cath and Coronary Angiography;  Surgeon: Adrian Prows, MD;  Location: [facility];  Service: Cardiovascular;  Laterality: N/A;  . CYST EXCISION Right    "wrist"  . DILATION AND CURETTAGE OF UTERUS    . FOOT SURGERY Bilateral    "corns removed"  . TUBAL LIGATION      OB History    No data available       Home Medications    Prior to Admission medications   Medication Sig Start Date End Date Taking? Authorizing Provider  acetaminophen (TYLENOL) 500 MG tablet Take 1,000 mg by mouth every 4 (four) hours as needed for moderate pain or headache.    [provider]  albuterol (PROVENTIL HFA;VENTOLIN HFA) 108 (90 BASE) MCG/ACT inhaler Inhale 2 puffs into the lungs every 6 (six) hours as needed for wheezing or shortness of breath.    [provider]  azithromycin (ZITHROMAX) 250 MG tablet Take 1 tablet (250 mg total) by mouth daily. Take first 2 tablets together, then 1 every day until finished. 12/19/17  Perseus Westall C, PA-C  benzonatate (TESSALON) 100 MG capsule Take 1 capsule (100 mg total) by mouth every 8 (eight) hours. 12/19/17   Alora Gorey C, PA-C  diclofenac sodium (VOLTAREN) 1 % GEL Apply 2 g topically 4 (four) times daily as needed (MSK pain associated with cough). Patient not taking: Reported on 10/29/2017 06/16/17   Thomasene Ripple, MD  guaiFENesin-codeine 100-10 MG/5ML syrup Take 5 mLs by mouth every 4 (four) hours as needed for cough. Patient not taking: Reported on 08/18/2017 06/16/17   Thomasene Ripple, MD  hydrochlorothiazide (HYDRODIURIL) 25 MG tablet Take 25 mg by mouth daily.    [provider]  hydrocortisone (ANUSOL-HC) 2.5 % rectal cream Apply rectally 2 times daily 10/29/17   Ocie Cornfield T, PA-C  Lidocaine, Anorectal, 5 % GEL Apply a pea-sized amount to the affected area 3 times daily. 10/29/17   Doristine Devoid, PA-C  loratadine (CLARITIN) 10 MG tablet Take 1 tablet (10 mg total) by mouth daily. 06/16/17   Thomasene Ripple, MD  meloxicam (MOBIC) 7.5 MG tablet Take 1 tablet (7.5 mg total) by mouth daily. Patient not taking: Reported on 08/18/2017 07/14/17   Langston Masker B, PA-C  nicotine (NICODERM CQ - DOSED IN MG/24 HR) 7 mg/24hr patch Place 1 patch (7 mg total) onto the skin daily. Patient not taking: Reported on 08/18/2017 06/17/17   Thomasene Ripple, MD  omeprazole (PRILOSEC) 20 MG capsule Take 20 mg by mouth daily.     [provider]  oxyCODONE-acetaminophen (PERCOCET) 5-325 MG tablet Take 1-2 tablets by mouth every 6 (six) hours as needed. Patient not taking: Reported on 08/18/2017 07/10/17   Little, Wenda Overland, MD  predniSONE (DELTASONE) 10 MG tablet Take 4 tablets (40 mg total) by mouth daily for 5 days. 12/19/17 12/24/17  Phebe Colla, PA-C    Family History Family History  Problem Relation Age of Onset  . Breast cancer Unknown   . Lung cancer Unknown   . Congestive Heart Failure Unknown   . Diabetes Unknown   . Hypertension Unknown     Social History Social History   Tobacco Use  . Smoking status: Current Some Day Smoker    Packs/day: 0.25    Years: 35.00    Pack years: 8.75    Types: Cigarettes  . Smokeless tobacco: Never Used  Substance Use Topics  . Alcohol use: Yes    Alcohol/week: 4.2 oz    Types: 7 Cans of beer per week  . Drug use: No    Comment: last used cocaine 2014     Allergies   Aspirin and Food   Review of Systems Review of Systems  Constitutional: Negative for chills, diaphoresis, fever and weight loss.  HENT: Negative for ear pain, rhinorrhea and sore throat.   Eyes: Negative for pain, discharge  and visual disturbance.  Respiratory: Positive for cough, shortness of breath and wheezing.   Cardiovascular: Negative for chest pain and palpitations.  Gastrointestinal: Negative for abdominal pain and vomiting.  Genitourinary: Negative for dysuria and hematuria.  Musculoskeletal: Negative for arthralgias, back pain and myalgias.  Skin: Negative for color change and rash.  Neurological: Negative for seizures, syncope and headaches.  All other systems reviewed and are negative.    Physical Exam Triage Vital Signs ED Triage Vitals [12/19/17 1023]  Enc Vitals Group     BP 119/60     Pulse Rate 95     Resp 18     Temp 98.6 F (37 C)  Temp src      SpO2 98 %     Weight      Height      Head Circumference      Peak Flow      Pain Score      Pain Loc      Pain Edu?      Excl. in [place]?    No data found.  Updated Vital Signs BP 119/60   Pulse 95   Temp 98.6 F (37 C)   Resp 18   SpO2 98%   Visual Acuity Right Eye Distance:   Left Eye Distance:   Bilateral Distance:    Right Eye Near:   Left Eye Near:    Bilateral Near:     Physical Exam  Constitutional: She appears well-developed and well-nourished. No distress.  HENT:  Head: Normocephalic and atraumatic.  Right Ear: Hearing, tympanic membrane, external ear and ear canal normal.  Left Ear: Hearing, tympanic membrane, external ear and ear canal normal.  Nose: Nose normal.  Mouth/Throat: Oropharynx is clear and moist. No oropharyngeal exudate, posterior oropharyngeal edema, posterior oropharyngeal erythema or tonsillar abscesses.  Eyes: Conjunctivae are normal.  Neck: Neck supple.  Cardiovascular: Normal rate and regular rhythm.  No murmur heard. Pulmonary/Chest: Effort normal and breath sounds normal. No stridor. No respiratory distress. She has no decreased breath sounds. She has no wheezes. She has no rhonchi. She has no rales.  Abdominal: Soft. There is no tenderness.  Musculoskeletal: She exhibits no  edema.  Neurological: She is alert.  Skin: Skin is warm and dry.  Psychiatric: She has a normal mood and affect.  Nursing note and vitals reviewed.    UC Treatments / Results  Labs (all labs ordered are listed, but only abnormal results are displayed) Labs Reviewed - No data to display  EKG  EKG Interpretation None       Radiology Dg Chest 2 View  Result Date: 12/19/2017 CLINICAL DATA:  Cough, shortness of Breath EXAM: CHEST  2 VIEW COMPARISON:  12/18/2017 FINDINGS: Mild peribronchial thickening. Heart and mediastinal contours are within normal limits. No focal opacities or effusions. No acute bony abnormality. IMPRESSION: Mild bronchitic changes. Electronically Signed   By: Rolm Baptise M.D.   On: 12/19/2017 10:49   Dg Chest 2 View  Result Date: 12/18/2017 CLINICAL DATA:  Cough for 1 week EXAM: CHEST  2 VIEW COMPARISON:  08/18/2017 FINDINGS: Heart and mediastinal contours are within normal limits. No focal opacities or effusions. No acute bony abnormality. IMPRESSION: No active cardiopulmonary disease. Electronically Signed   By: Rolm Baptise M.D.   On: 12/18/2017 09:25    Procedures Procedures (including critical care time)  Medications Ordered in UC Medications - No data to display   Initial Impression / Assessment and Plan / UC Course  I have reviewed the triage vital signs and the nursing notes.  Pertinent labs & imaging results that were available during my care of the patient were reviewed by me and considered in my medical decision making (see chart for details).     Chest x-ray done today shows mild bronchitis.  Will treat symptomatically with prednisone and cough medication.  Due to her underlying history of asthma, will treat with Z-Pak  Final Clinical Impressions(s) / UC Diagnoses   Final diagnoses:  Acute bacterial bronchitis    ED Discharge Orders        Ordered    predniSONE (DELTASONE) 10 MG tablet  Daily     12/19/17  1054    benzonatate  (TESSALON) 100 MG capsule  Every 8 hours     12/19/17 1054    azithromycin (ZITHROMAX) 250 MG tablet  Daily     12/19/17 1054       Controlled Substance Prescriptions [place] Controlled Substance Registry consulted? Not Applicable   Phebe Colla, [place] 12/19/17 1058

## 2017-12-19 NOTE — ED Triage Notes (Signed)
Pt here for cough, congestion since Monday. Reports sores on the back of her throat. Denies fever. Hx of asthma. Using albuterol and neb with some relief.

## 2017-12-19 NOTE — ED Notes (Signed)
12/19/17 1430 Follow-up call completed. Pt pleasant and grateful for call.

## 2017-12-20 LAB — CULTURE, GROUP A STREP (THRC)

## 2017-12-23 ENCOUNTER — Emergency Department (HOSPITAL_COMMUNITY): Payer: Self-pay

## 2017-12-23 ENCOUNTER — Inpatient Hospital Stay (HOSPITAL_COMMUNITY)
Admission: EM | Admit: 2017-12-23 | Discharge: 2017-12-26 | DRG: 281 | Disposition: A | Discharge status: Home / Self Care | Payer: Self-pay | Attending: Cardiology | Admitting: Cardiology

## 2017-12-23 ENCOUNTER — Other Ambulatory Visit: Payer: Self-pay

## 2017-12-23 ENCOUNTER — Encounter (HOSPITAL_COMMUNITY): Payer: Self-pay | Admitting: Emergency Medicine

## 2017-12-23 DIAGNOSIS — K219 Gastro-esophageal reflux disease without esophagitis: Secondary | ICD-10-CM | POA: Diagnosis present

## 2017-12-23 DIAGNOSIS — Z8679 Personal history of other diseases of the circulatory system: Secondary | ICD-10-CM

## 2017-12-23 DIAGNOSIS — T45525A Adverse effect of antithrombotic drugs, initial encounter: Secondary | ICD-10-CM | POA: Diagnosis not present

## 2017-12-23 DIAGNOSIS — Z79899 Other long term (current) drug therapy: Secondary | ICD-10-CM

## 2017-12-23 DIAGNOSIS — K625 Hemorrhage of anus and rectum: Secondary | ICD-10-CM

## 2017-12-23 DIAGNOSIS — Z8659 Personal history of other mental and behavioral disorders: Secondary | ICD-10-CM

## 2017-12-23 DIAGNOSIS — I201 Angina pectoris with documented spasm: Secondary | ICD-10-CM | POA: Diagnosis present

## 2017-12-23 DIAGNOSIS — F17210 Nicotine dependence, cigarettes, uncomplicated: Secondary | ICD-10-CM | POA: Diagnosis present

## 2017-12-23 DIAGNOSIS — R064 Hyperventilation: Secondary | ICD-10-CM | POA: Diagnosis present

## 2017-12-23 DIAGNOSIS — T45515A Adverse effect of anticoagulants, initial encounter: Secondary | ICD-10-CM | POA: Diagnosis not present

## 2017-12-23 DIAGNOSIS — K921 Melena: Secondary | ICD-10-CM | POA: Diagnosis not present

## 2017-12-23 DIAGNOSIS — K8681 Exocrine pancreatic insufficiency: Secondary | ICD-10-CM | POA: Diagnosis present

## 2017-12-23 DIAGNOSIS — I340 Nonrheumatic mitral (valve) insufficiency: Secondary | ICD-10-CM | POA: Diagnosis present

## 2017-12-23 DIAGNOSIS — J40 Bronchitis, not specified as acute or chronic: Secondary | ICD-10-CM | POA: Diagnosis present

## 2017-12-23 DIAGNOSIS — N179 Acute kidney failure, unspecified: Secondary | ICD-10-CM | POA: Diagnosis not present

## 2017-12-23 DIAGNOSIS — F14188 Cocaine abuse with other cocaine-induced disorder: Secondary | ICD-10-CM | POA: Diagnosis present

## 2017-12-23 DIAGNOSIS — I214 Non-ST elevation (NSTEMI) myocardial infarction: Principal | ICD-10-CM | POA: Diagnosis present

## 2017-12-23 DIAGNOSIS — Z8711 Personal history of peptic ulcer disease: Secondary | ICD-10-CM

## 2017-12-23 DIAGNOSIS — Y92230 Patient room in hospital as the place of occurrence of the external cause: Secondary | ICD-10-CM | POA: Diagnosis not present

## 2017-12-23 DIAGNOSIS — M1712 Unilateral primary osteoarthritis, left knee: Secondary | ICD-10-CM | POA: Diagnosis present

## 2017-12-23 DIAGNOSIS — I959 Hypotension, unspecified: Secondary | ICD-10-CM | POA: Diagnosis present

## 2017-12-23 DIAGNOSIS — Z882 Allergy status to sulfonamides status: Secondary | ICD-10-CM

## 2017-12-23 DIAGNOSIS — M1611 Unilateral primary osteoarthritis, right hip: Secondary | ICD-10-CM | POA: Diagnosis present

## 2017-12-23 DIAGNOSIS — K649 Unspecified hemorrhoids: Secondary | ICD-10-CM | POA: Diagnosis present

## 2017-12-23 DIAGNOSIS — Z91018 Allergy to other foods: Secondary | ICD-10-CM

## 2017-12-23 DIAGNOSIS — I319 Disease of pericardium, unspecified: Secondary | ICD-10-CM

## 2017-12-23 DIAGNOSIS — K900 Celiac disease: Secondary | ICD-10-CM | POA: Diagnosis present

## 2017-12-23 DIAGNOSIS — F1410 Cocaine abuse, uncomplicated: Secondary | ICD-10-CM

## 2017-12-23 DIAGNOSIS — I739 Peripheral vascular disease, unspecified: Secondary | ICD-10-CM | POA: Diagnosis present

## 2017-12-23 DIAGNOSIS — E876 Hypokalemia: Secondary | ICD-10-CM | POA: Diagnosis present

## 2017-12-23 DIAGNOSIS — Z23 Encounter for immunization: Secondary | ICD-10-CM

## 2017-12-23 LAB — URINALYSIS, ROUTINE W REFLEX MICROSCOPIC
Bilirubin Urine: NEGATIVE
Glucose, UA: NEGATIVE mg/dL
Ketones, ur: 5 mg/dL — AB
Nitrite: NEGATIVE
PROTEIN: 30 mg/dL — AB
SPECIFIC GRAVITY, URINE: 1.018 (ref 1.005–1.030)
pH: 5 (ref 5.0–8.0)

## 2017-12-23 LAB — D-DIMER, QUANTITATIVE: D-Dimer, Quant: 0.42 ug/mL-FEU (ref 0.00–0.50)

## 2017-12-23 LAB — TROPONIN I
TROPONIN I: 0.04 ng/mL — AB (ref <0.03)
TROPONIN I: 7.28 ng/mL — AB (ref <0.03)
Troponin I: 0.42 ng/mL (ref <0.03)

## 2017-12-23 LAB — RAPID URINE DRUG SCREEN, HOSP PERFORMED
Amphetamines: NOT DETECTED
Barbiturates: NOT DETECTED
Benzodiazepines: POSITIVE — AB
COCAINE: POSITIVE — AB
OPIATES: POSITIVE — AB
Tetrahydrocannabinol: NOT DETECTED

## 2017-12-23 LAB — BASIC METABOLIC PANEL
ANION GAP: 20 — AB (ref 5–15)
BUN: 17 mg/dL (ref 6–20)
CO2: 19 mmol/L — ABNORMAL LOW (ref 22–32)
Calcium: 9.7 mg/dL (ref 8.9–10.3)
Chloride: 101 mmol/L (ref 101–111)
Creatinine, Ser: 1.23 mg/dL — ABNORMAL HIGH (ref 0.44–1.00)
GFR calc Af Amer: 57 mL/min — ABNORMAL LOW (ref >60)
GFR, EST NON AFRICAN AMERICAN: 49 mL/min — AB (ref >60)
GLUCOSE: 84 mg/dL (ref 65–99)
POTASSIUM: 3 mmol/L — AB (ref 3.5–5.1)
SODIUM: 140 mmol/L (ref 135–145)

## 2017-12-23 LAB — HIV ANTIBODY (ROUTINE TESTING W REFLEX): HIV Screen 4th Generation wRfx: NONREACTIVE

## 2017-12-23 LAB — CBC
HEMATOCRIT: 41.4 % (ref 36.0–46.0)
Hemoglobin: 13.7 g/dL (ref 12.0–15.0)
MCH: 28.8 pg (ref 26.0–34.0)
MCHC: 33.1 g/dL (ref 30.0–36.0)
MCV: 87 fL (ref 78.0–100.0)
Platelets: 279 10*3/uL (ref 150–400)
RBC: 4.76 MIL/uL (ref 3.87–5.11)
RDW: 13.9 % (ref 11.5–15.5)
WBC: 17 10*3/uL — AB (ref 4.0–10.5)

## 2017-12-23 LAB — I-STAT TROPONIN, ED: Troponin i, poc: 0.04 ng/mL (ref 0.00–0.08)

## 2017-12-23 LAB — HEPARIN LEVEL (UNFRACTIONATED)

## 2017-12-23 LAB — I-STAT BETA HCG BLOOD, ED (MC, WL, AP ONLY): HCG, QUANTITATIVE: 5.3 m[IU]/mL — AB (ref <5)

## 2017-12-23 LAB — ECHOCARDIOGRAM COMPLETE

## 2017-12-23 MED ORDER — SODIUM CHLORIDE 0.9% FLUSH: 3.0000 mL | Freq: Two times a day (BID) | INTRAVENOUS | Status: DC

## 2017-12-23 MED ORDER — NITROGLYCERIN 0.4 MG/HR TD PT24
0.4000 mg | MEDICATED_PATCH | Freq: Every day | TRANSDERMAL | Status: DC
  Administered 2017-12-23 – 2017-12-25 (×3): 0.4 mg via TRANSDERMAL
  Filled 2017-12-23 (×3): qty 1

## 2017-12-23 MED ORDER — SODIUM CHLORIDE 0.9 % IV SOLN
INTRAVENOUS | Status: DC
  Administered 2017-12-23 – 2017-12-25 (×3): via INTRAVENOUS

## 2017-12-23 MED ORDER — CLOPIDOGREL BISULFATE 75 MG PO TABS
75.0000 mg | ORAL_TABLET | Freq: Every day | ORAL | Status: DC
  Administered 2017-12-23: 75 mg via ORAL
  Filled 2017-12-23 (×2): qty 1

## 2017-12-23 MED ORDER — IBUPROFEN 600 MG PO TABS
600.0000 mg | ORAL_TABLET | Freq: Four times a day (QID) | ORAL | Status: DC | PRN
Start: 2017-12-23 — End: 2017-12-26
  Administered 2017-12-23 (×3): 600 mg via ORAL
  Filled 2017-12-23 (×3): qty 1

## 2017-12-23 MED ORDER — MORPHINE SULFATE (PF) 4 MG/ML IV SOLN
4.0000 mg | Freq: Once | INTRAVENOUS | Status: AC
  Administered 2017-12-23: 4 mg via INTRAVENOUS
  Filled 2017-12-23: qty 1

## 2017-12-23 MED ORDER — NITROGLYCERIN 0.4 MG SL SUBL
0.4000 mg | SUBLINGUAL_TABLET | SUBLINGUAL | Status: DC | PRN
  Filled 2017-12-23: qty 1

## 2017-12-23 MED ORDER — COLCHICINE 0.6 MG PO TABS
0.6000 mg | ORAL_TABLET | Freq: Two times a day (BID) | ORAL | Status: DC
  Administered 2017-12-23 – 2017-12-24 (×3): 0.6 mg via ORAL
  Filled 2017-12-23 (×3): qty 1

## 2017-12-23 MED ORDER — HEPARIN BOLUS VIA INFUSION
4000.0000 [IU] | Freq: Once | INTRAVENOUS | Status: AC
  Administered 2017-12-23: 4000 [IU] via INTRAVENOUS
  Filled 2017-12-23: qty 4000

## 2017-12-23 MED ORDER — PNEUMOCOCCAL VAC POLYVALENT 25 MCG/0.5ML IJ INJ
0.5000 mL | INJECTION | INTRAMUSCULAR | Status: AC
  Administered 2017-12-25: 0.5 mL via INTRAMUSCULAR
  Filled 2017-12-23 (×2): qty 0.5

## 2017-12-23 MED ORDER — NICOTINE 7 MG/24HR TD PT24
7.0000 mg | MEDICATED_PATCH | Freq: Every day | TRANSDERMAL | Status: DC
  Administered 2017-12-23 – 2017-12-26 (×4): 7 mg via TRANSDERMAL
  Filled 2017-12-23 (×4): qty 1

## 2017-12-23 MED ORDER — ALBUTEROL SULFATE (2.5 MG/3ML) 0.083% IN NEBU: 3.0000 mL | INHALATION_SOLUTION | Freq: Four times a day (QID) | RESPIRATORY_TRACT | Status: DC | PRN

## 2017-12-23 MED ORDER — PANTOPRAZOLE SODIUM 40 MG PO TBEC
40.0000 mg | DELAYED_RELEASE_TABLET | Freq: Every day | ORAL | Status: DC
  Administered 2017-12-23 – 2017-12-26 (×4): 40 mg via ORAL
  Filled 2017-12-23 (×4): qty 1

## 2017-12-23 MED ORDER — HEPARIN (PORCINE) IN NACL 100-0.45 UNIT/ML-% IJ SOLN
1200.0000 [IU]/h | INTRAMUSCULAR | Status: DC
  Administered 2017-12-23: 1000 [IU]/h via INTRAVENOUS
  Filled 2017-12-23 (×2): qty 250

## 2017-12-23 MED ORDER — HYDROCHLOROTHIAZIDE 25 MG PO TABS
25.0000 mg | ORAL_TABLET | Freq: Every day | ORAL | Status: DC
  Administered 2017-12-23 – 2017-12-24 (×2): 25 mg via ORAL
  Filled 2017-12-23 (×2): qty 1

## 2017-12-23 MED ORDER — NITROGLYCERIN IN D5W 200-5 MCG/ML-% IV SOLN
0.0000 ug/min | Freq: Once | INTRAVENOUS | Status: DC
  Filled 2017-12-23: qty 250

## 2017-12-23 MED ORDER — SODIUM CHLORIDE 0.9 % IV SOLN
INTRAVENOUS | Status: DC
  Administered 2017-12-23: 23:00:00 via INTRAVENOUS

## 2017-12-23 MED ORDER — SODIUM CHLORIDE 0.9% FLUSH: 3.0000 mL | INTRAVENOUS | Status: DC | PRN

## 2017-12-23 MED ORDER — ONDANSETRON HCL 4 MG/2ML IJ SOLN: 4.0000 mg | Freq: Four times a day (QID) | INTRAMUSCULAR | Status: DC | PRN

## 2017-12-23 MED ORDER — INFLUENZA VAC SPLIT QUAD 0.5 ML IM SUSY
0.5000 mL | PREFILLED_SYRINGE | INTRAMUSCULAR | Status: DC
  Filled 2017-12-23: qty 0.5

## 2017-12-23 MED ORDER — POTASSIUM CHLORIDE CRYS ER 20 MEQ PO TBCR
40.0000 meq | EXTENDED_RELEASE_TABLET | Freq: Two times a day (BID) | ORAL | Status: AC
  Administered 2017-12-23 – 2017-12-24 (×2): 40 meq via ORAL
  Filled 2017-12-23 (×2): qty 2

## 2017-12-23 MED ORDER — SODIUM CHLORIDE 0.9 % IV SOLN: 250.0000 mL | INTRAVENOUS | Status: DC | PRN

## 2017-12-23 MED ORDER — LORATADINE 10 MG PO TABS
10.0000 mg | ORAL_TABLET | Freq: Every day | ORAL | Status: DC
  Administered 2017-12-23 – 2017-12-26 (×4): 10 mg via ORAL
  Filled 2017-12-23 (×4): qty 1

## 2017-12-23 MED ORDER — GUAIFENESIN-CODEINE 100-10 MG/5ML PO SOLN: 5.0000 mL | ORAL | Status: DC | PRN

## 2017-12-23 MED ORDER — LORAZEPAM 2 MG/ML IJ SOLN
1.0000 mg | Freq: Once | INTRAMUSCULAR | Status: AC
  Administered 2017-12-23: 1 mg via INTRAVENOUS
  Filled 2017-12-23: qty 1

## 2017-12-23 NOTE — ED Notes (Signed)
Got patient on the bedpan

## 2017-12-23 NOTE — Progress Notes (Signed)
Patient arrived to floor with 10/10 chest pain. Dr. Virgina Jock at beddise. ED did not complete troponin or EKG that was ordered. Patient was supposed to be on NTG drip but ED did not start this either. Patient will have to be transffered to start NTG drip.

## 2017-12-23 NOTE — H&P (Addendum)
Denise Knight is an 52 y.o. female.   Chief Complaint: Chest pain  HPI:   52 year old African-American female with hypertension, history of depression, schizophrenia, celiac disease, history of stomach ulcer, history of possible myopericarditis in 06/2016. Patient presented to the hospital with sudden onset of chest pain starting around 3 AM on 12/23/2017. Patient is right-sided, sharp, feels like someone standing on her chest. There is no radiation or associated shortness of breath. Pain worsens with certain activities such as rolling to her side or sitting up. She reports cough and "bronchtis" last few days. No fever, chills. Patient is hemodynamically stable in the ED with unchanged quality and severity of pain for last however. EKG shows diffuse 1 mm ST elevation in all leads with hyperacute T waves. She is currently somnolent after having received 4 mg of morphine and 1 mg of Ativan in the ED. STAT echocardiogram shows normal EF and wall motion.  Patient had identical presentation in 06/2016, trop reaching up to 7. Cath then showed normal coronaries.   Past Medical History:  Diagnosis Date  . Anxiety   . Arthritis    "left knee" (07/05/2016)  . Asthma   . Bipolar 1 disorder ([place])   . Celiac disease    pt denies this hx on 07/05/2016  . Depression   . GERD (gastroesophageal reflux disease)   . Hypertension   . MI (mitral incompetence)   . Post traumatic stress disorder   . Schizophrenia ([place])   . Stomach ulcer     Past Surgical History:  Procedure Laterality Date  . CARDIAC CATHETERIZATION  07/05/2016  . CARDIAC CATHETERIZATION N/A 07/05/2016   Procedure: Left Heart Cath and Coronary Angiography;  Surgeon: Adrian Prows, MD;  Location: [facility];  Service: Cardiovascular;  Laterality: N/A;  . CYST EXCISION Right    "wrist"  . DILATION AND CURETTAGE OF UTERUS    . FOOT SURGERY Bilateral    "corns removed"  . TUBAL LIGATION      Family History  Problem Relation Age of  Onset  . Breast cancer Unknown   . Lung cancer Unknown   . Congestive Heart Failure Unknown   . Diabetes Unknown   . Hypertension Unknown    Social History:  reports that she has been smoking cigarettes.  She has a 8.75 pack-year smoking history. she has never used smokeless tobacco. She reports that she drinks about 4.2 oz of alcohol per week. She reports that she does not use drugs.  Allergies:  Allergies  Allergen Reactions  . Aspirin Hives  . Food Hives    "Regular butter"     (Not in a hospital admission)  Results for orders placed or performed during the hospital encounter of 12/23/17 (from the past 48 hour(s))  Basic metabolic panel     Status: Abnormal   Collection Time: 12/23/17  4:32 AM  Result Value Ref Range   Sodium 140 135 - 145 mmol/L   Potassium 3.0 (L) 3.5 - 5.1 mmol/L   Chloride 101 101 - 111 mmol/L   CO2 19 (L) 22 - 32 mmol/L   Glucose, Bld 84 65 - 99 mg/dL   BUN 17 6 - 20 mg/dL   Creatinine, Ser 1.23 (H) 0.44 - 1.00 mg/dL   Calcium 9.7 8.9 - 10.3 mg/dL   GFR calc non Af Amer 49 (L) >60 mL/min   GFR calc Af Amer 57 (L) >60 mL/min    Comment: (NOTE) The eGFR has been calculated using the CKD  EPI equation. This calculation has not been validated in all clinical situations. eGFR's persistently <60 mL/min signify possible Chronic Kidney Disease.    Anion gap 20 (H) 5 - 15    Comment: Performed at [facility], [place] [address] [place], [place] [address]  CBC     Status: Abnormal   Collection Time: 12/23/17  4:32 AM  Result Value Ref Range   WBC 17.0 (H) 4.0 - 10.5 K/uL   RBC 4.76 3.87 - 5.11 MIL/uL   Hemoglobin 13.7 12.0 - 15.0 g/dL   HCT 41.4 36.0 - 46.0 %   MCV 87.0 78.0 - 100.0 fL   MCH 28.8 26.0 - 34.0 pg   MCHC 33.1 30.0 - 36.0 g/dL   RDW 13.9 11.5 - 15.5 %   Platelets 279 150 - 400 K/uL    Comment: Performed at [facility], [place] [address], [place], [place] [address]  I-stat troponin, ED     Status: None   Collection Time:  12/23/17  4:41 AM  Result Value Ref Range   Troponin i, poc 0.04 0.00 - 0.08 ng/mL   Comment 3            Comment: Due to the release kinetics of cTnI, a negative result within the first hours of the onset of symptoms does not rule out myocardial infarction with certainty. If myocardial infarction is still suspected, repeat the test at appropriate intervals.   Troponin I     Status: Abnormal   Collection Time: 12/23/17  4:42 AM  Result Value Ref Range   Troponin I 0.04 (HH) <0.03 ng/mL    Comment: CRITICAL RESULT CALLED TO, READ BACK BY AND VERIFIED WITH: KNUCKLES,C RN 12/23/2017 0602 JORDANS  Performed at [facility], [place] [address] [place], [place] [address]   I-Stat beta hCG blood, ED     Status: Abnormal   Collection Time: 12/23/17  4:52 AM  Result Value Ref Range   I-stat hCG, quantitative 5.3 (H) <5 mIU/mL   Comment 3            Comment:   GEST. AGE      CONC.  (mIU/mL)   <=1 WEEK        5 - 50     2 WEEKS       50 - 500     3 WEEKS       100 - 10,000     4 WEEKS     1,000 - 30,000        FEMALE AND NON-PREGNANT FEMALE:     LESS THAN 5 mIU/mL   D-dimer, quantitative (not at [facility])     Status: None   Collection Time: 12/23/17  5:30 AM  Result Value Ref Range   D-Dimer, Quant 0.42 0.00 - 0.50 ug/mL-FEU    Comment: (NOTE) At the manufacturer cut-off of 0.50 ug/mL FEU, this assay has been documented to exclude PE with a sensitivity and negative predictive value of 97 to 99%.  At this time, this assay has not been approved by the FDA to exclude DVT/VTE. Results should be correlated with clinical presentation. Performed at [facility], [place] [address], [place], [place] [address]    Dg Chest 2 View  Result Date: 12/23/2017 CLINICAL DATA:  Acute onset of central chest pain.  Cough. EXAM: CHEST  2 VIEW COMPARISON:  Chest radiograph performed 12/19/2017 FINDINGS: The lungs are well-aerated and clear. There is no evidence of focal  opacification, pleural effusion  or pneumothorax. The heart is normal in size; the mediastinal contour is within normal limits. No acute osseous abnormalities are seen. IMPRESSION: No acute cardiopulmonary process seen. Electronically Signed   By: Garald Balding M.D.   On: 12/23/2017 05:17    Review of Systems  Constitutional: Positive for malaise/fatigue.  HENT: Negative.   Eyes: Negative.   Respiratory: Positive for cough. Negative for sputum production and shortness of breath.   Cardiovascular: Positive for chest pain. Negative for orthopnea, leg swelling and PND.  Gastrointestinal: Negative for heartburn.  Genitourinary: Negative.   Musculoskeletal: Negative.   Neurological: Negative.   Endo/Heme/Allergies: Does not bruise/bleed easily.  Psychiatric/Behavioral: The patient is nervous/anxious.        History of depression, schizophrenia, bipolar disorder  All other systems reviewed and are negative.   Blood pressure 94/68, pulse 80, temperature 98.3 F (36.8 C), temperature source Oral, resp. rate 15, SpO2 95 %. Physical Exam  Nursing note and vitals reviewed. Constitutional: She is oriented to person, place, and time. She appears well-developed and well-nourished.  HENT:  Head: Normocephalic and atraumatic.  Eyes: Conjunctivae are normal. Pupils are equal, round, and reactive to light.  Neck: Normal range of motion. Neck supple. No JVD present.  Cardiovascular: Normal rate and regular rhythm.  No murmur heard. Respiratory: Effort normal and breath sounds normal. No respiratory distress. She has no wheezes. She has no rales. She exhibits no tenderness.  GI: Soft. Bowel sounds are normal. There is no tenderness.  Musculoskeletal: Normal range of motion. She exhibits no edema.  Lymphadenopathy:    She has no cervical adenopathy.  Neurological: She is alert and oriented to person, place, and time. No cranial nerve deficit.  Somnolent, but arousable  Skin: Skin is warm and dry.  Psychiatric:  Patient somnolent.  Not assessed.     Assessment:  52 year old African-American female   Place in Observation Chest pain: Likely pericarditis.  Mild troponin elevation: Not ACS. Likely related to pericarditis H/o hypertension: Currently normotensive H/o psychiatric illnesses: Schizophrenia, bipolar disorder, depression. Currently stable Tobacco abuse: On nicotine patch  Plan: Serial cardiac enzymes. STEMI alert not activated at this time. Colchicine 0.6 mg twice daily, along with ibuprofen. She is allergic to aspirin.   Nigel Mormon, MD 12/23/2017, 6:44 AM  Nigel Mormon, MD [facility] Pager: [phone] Office: [phone] If no answer Cell [phone]

## 2017-12-23 NOTE — Progress Notes (Signed)
Tropon of 7 ng/mL noted. Patient has had significant improvement in chest pain, EKG has normalized. Echo is normal. Patient just had PO intake. Will plan for cath 12/24/2017 am.  Nigel Mormon, MD [facility] Pager: [phone] Office: [phone] If no answer Cell [phone]

## 2017-12-23 NOTE — ED Notes (Signed)
Got patient off the bedpan patient is resting with call bell in reach

## 2017-12-23 NOTE — Progress Notes (Signed)
Cocaine positive. Likely diffuse coronary vasospasm. Will continue nitro and start heparin.   Nigel Mormon, MD [facility] Pager: [phone] Office: [phone] If no answer Cell [phone]

## 2017-12-23 NOTE — H&P (View-Only) (Signed)
Tropon of 7 ng/mL noted. Patient has had significant improvement in chest pain, EKG has normalized. Echo is normal. Patient just had PO intake. Will plan for cath 12/24/2017 am.  Nigel Mormon, MD [facility] [place] Pager: [phone] Office: [phone] If no answer Cell [phone]

## 2017-12-23 NOTE — Progress Notes (Signed)
EKG improved, back to baseline. Will place NTG paste. Serial cardiac enzymes. Cath only if remarkable increase in troponin.  Nigel Mormon, MD [facility] Pager: [phone] Office: [phone] If no answer Cell [phone]

## 2017-12-23 NOTE — ED Notes (Signed)
Report Denise Knight

## 2017-12-23 NOTE — ED Notes (Signed)
Warm blankets given; reports she is still having chest pain.

## 2017-12-23 NOTE — ED Notes (Signed)
Attending paged regarding troponin and chest pain

## 2017-12-23 NOTE — ED Notes (Signed)
Attempted report 

## 2017-12-23 NOTE — Progress Notes (Signed)
  Echocardiogram 2D Echocardiogram has been performed.  Denise Knight 12/23/2017, 7:39 AM

## 2017-12-23 NOTE — ED Notes (Signed)
Attending notified of chest pain and troponin. No further orders.

## 2017-12-23 NOTE — Progress Notes (Signed)
ANTICOAGULATION CONSULT NOTE - Initial Consult  Pharmacy Consult for Heparin Indication: chest pain/ACS  Allergies  Allergen Reactions  . Aspirin Hives  . Food Hives    "Regular butter"    Patient Measurements:   Heparin Dosing Weight: 77 kg   Vital Signs: Temp: 98.3 F (36.8 C) (02/12 0430) Temp Source: Oral (02/12 0430) BP: 118/88 (02/12 0945) Pulse Rate: 78 (02/12 1100)  Labs: Recent Labs    12/23/17 0432 12/23/17 0442 12/23/17 0725  HGB 13.7  --   --   HCT 41.4  --   --   PLT 279  --   --   CREATININE 1.23*  --   --   TROPONINI  --  0.04* 0.42*    CrCl cannot be calculated (Unknown ideal weight.).   Medical History: Past Medical History:  Diagnosis Date  . Anxiety   . Arthritis    "left knee" (07/05/2016)  . Asthma   . Bipolar 1 disorder ([place])   . Celiac disease    pt denies this hx on 07/05/2016  . Depression   . GERD (gastroesophageal reflux disease)   . Hypertension   . MI (mitral incompetence)   . Post traumatic stress disorder   . Schizophrenia ([place])   . Stomach ulcer     Medications:   (Not in a hospital admission)  Assessment: 55 YOF with sudden onset of chest pain positive for cocaine. Pharmacy consulted to start IV heparin for NSTEMI. H/H and Plt wnl. Pt is not on any anticoagulation prior to admission  Goal of Therapy:  Heparin level 0.3-0.7 units/ml Monitor platelets by anticoagulation protocol: Yes   Plan:  -Heparin 4000 units IV once, then heparin 1000 units/hr  -F/u 6 hr HL -Monitor daily HL, CBC and s/s of bleeding  Albertina Parr, PharmD., BCPS Clinical Pharmacist Pager [phone]

## 2017-12-23 NOTE — ED Provider Notes (Signed)
[facility] EMERGENCY DEPARTMENT Provider Note   CSN: [id number] Arrival date & time: 12/23/17  0425     History   Chief Complaint Chief Complaint  Patient presents with  . Chest Pain    HPI Zaharah Amir is a 52 y.o. female.  Patient presents via EMS with central and right-sided chest pain onset about 90 minutes ago.  This happened after she was smoking a cigarette.  She is anxious and tearful and hyperventilating.  She states she has had this pain before when she had "inflammation around her heart".  Patient was seen in urgent care 3 days ago and treated for bronchitis with prednisone and antibiotics.  She continues to smoke cigarettes.  She denies any cocaine or other drug use.  She feels chest pain that radiates to her right side associated with shortness of breath.  No nausea or vomiting.  She has had a cough productive of clear mucus.  Denies diarrhea.  She denies any cardiac history.  She still has a gallbladder in place she refused aspirin and nitroglycerin from EMS.    Chest Pain   Associated symptoms include cough and shortness of breath. Pertinent negatives include no abdominal pain, no dizziness, no fever, no headaches, no nausea, no vomiting and no weakness.    Past Medical History:  Diagnosis Date  . Anxiety   . Arthritis    "left knee" (07/05/2016)  . Asthma   . Bipolar 1 disorder ([place])   . Celiac disease    pt denies this hx on 07/05/2016  . Depression   . GERD (gastroesophageal reflux disease)   . Hypertension   . MI (mitral incompetence)   . Post traumatic stress disorder   . Schizophrenia ([place])   . Stomach ulcer     Patient Active Problem List   Diagnosis Date Noted  . Acute viral bronchitis 06/14/2017  . Asthma exacerbation 06/14/2017  . Chest pain 07/05/2016    Past Surgical History:  Procedure Laterality Date  . CARDIAC CATHETERIZATION  07/05/2016  . CARDIAC CATHETERIZATION N/A 07/05/2016   Procedure: Left Heart Cath and  Coronary Angiography;  Surgeon: Adrian Prows, MD;  Location: [facility];  Service: Cardiovascular;  Laterality: N/A;  . CYST EXCISION Right    "wrist"  . DILATION AND CURETTAGE OF UTERUS    . FOOT SURGERY Bilateral    "corns removed"  . TUBAL LIGATION      OB History    No data available       Home Medications    Prior to Admission medications   Medication Sig Start Date End Date Taking? Authorizing Provider  acetaminophen (TYLENOL) 500 MG tablet Take 1,000 mg by mouth every 4 (four) hours as needed for moderate pain or headache.   Yes [provider]  albuterol (PROVENTIL HFA;VENTOLIN HFA) 108 (90 BASE) MCG/ACT inhaler Inhale 2 puffs into the lungs every 6 (six) hours as needed for wheezing or shortness of breath.   Yes [provider]  azithromycin (ZITHROMAX) 250 MG tablet Take 1 tablet (250 mg total) by mouth daily. Take first 2 tablets together, then 1 every day until finished. 12/19/17  Yes Blue, Olivia C, PA-C  benzonatate (TESSALON) 100 MG capsule Take 1 capsule (100 mg total) by mouth every 8 (eight) hours. 12/19/17  Yes Blue, Olivia C, PA-C  hydrochlorothiazide (HYDRODIURIL) 25 MG tablet Take 25 mg by mouth daily.   Yes [provider]  loratadine (CLARITIN) 10 MG tablet Take 1 tablet (10  mg total) by mouth daily. 06/16/17  Yes Nedrud, Larena Glassman, MD  omeprazole (PRILOSEC) 20 MG capsule Take 20 mg by mouth daily.    Yes [provider]  predniSONE (DELTASONE) 10 MG tablet Take 4 tablets (40 mg total) by mouth daily for 5 days. 12/19/17 12/24/17 Yes Blue, Olivia C, PA-C  diclofenac sodium (VOLTAREN) 1 % GEL Apply 2 g topically 4 (four) times daily as needed (MSK pain associated with cough). Patient not taking: Reported on 10/29/2017 06/16/17   Thomasene Ripple, MD  guaiFENesin-codeine 100-10 MG/5ML syrup Take 5 mLs by mouth every 4 (four) hours as needed for cough. Patient not taking: Reported on 08/18/2017 06/16/17   Thomasene Ripple, MD    hydrocortisone (ANUSOL-HC) 2.5 % rectal cream Apply rectally 2 times daily Patient not taking: Reported on 12/23/2017 10/29/17   Ocie Cornfield T, PA-C  Lidocaine, Anorectal, 5 % GEL Apply a pea-sized amount to the affected area 3 times daily. Patient not taking: Reported on 12/23/2017 10/29/17   Ocie Cornfield T, PA-C  meloxicam (MOBIC) 7.5 MG tablet Take 1 tablet (7.5 mg total) by mouth daily. Patient not taking: Reported on 08/18/2017 07/14/17   Langston Masker B, PA-C  nicotine (NICODERM CQ - DOSED IN MG/24 HR) 7 mg/24hr patch Place 1 patch (7 mg total) onto the skin daily. Patient not taking: Reported on 08/18/2017 06/17/17   Thomasene Ripple, MD  oxyCODONE-acetaminophen (PERCOCET) 5-325 MG tablet Take 1-2 tablets by mouth every 6 (six) hours as needed. Patient not taking: Reported on 08/18/2017 07/10/17   Little, Wenda Overland, MD    Family History Family History  Problem Relation Age of Onset  . Breast cancer Unknown   . Lung cancer Unknown   . Congestive Heart Failure Unknown   . Diabetes Unknown   . Hypertension Unknown     Social History Social History   Tobacco Use  . Smoking status: Current Some Day Smoker    Packs/day: 0.25    Years: 35.00    Pack years: 8.75    Types: Cigarettes  . Smokeless tobacco: Never Used  Substance Use Topics  . Alcohol use: Yes    Alcohol/week: 4.2 oz    Types: 7 Cans of beer per week  . Drug use: No    Comment: last used cocaine 2014     Allergies   Aspirin and Food   Review of Systems Review of Systems  Constitutional: Negative for activity change, appetite change and fever.  HENT: Positive for congestion. Negative for rhinorrhea.   Respiratory: Positive for cough, chest tightness and shortness of breath.   Cardiovascular: Positive for chest pain.  Gastrointestinal: Negative for abdominal pain, nausea and vomiting.  Genitourinary: Negative for dysuria, hematuria, urgency and vaginal discharge.  Musculoskeletal: Negative for  arthralgias and myalgias.  Skin: Negative for rash.  Neurological: Negative for dizziness, tremors, weakness and headaches.  Hematological: Negative for adenopathy.    all other systems are negative except as noted in the HPI and PMH.    Physical Exam Updated Vital Signs BP 106/75   Pulse 77   Temp 98.3 F (36.8 C) (Oral)   Resp (!) 23   SpO2 100%   Physical Exam  Constitutional: She is oriented to person, place, and time. She appears well-developed and well-nourished. No distress.  Patient is tearful, anxious and hyperventilating  HENT:  Head: Normocephalic and atraumatic.  Mouth/Throat: Oropharynx is clear and moist. No oropharyngeal exudate.  Eyes: Conjunctivae and EOM are normal. Pupils are equal, round, and reactive  to light.  Neck: Normal range of motion. Neck supple.  No meningismus.  Cardiovascular: Normal rate, regular rhythm, normal heart sounds and intact distal pulses.  No murmur heard. Pulmonary/Chest: Effort normal and breath sounds normal. No respiratory distress. She exhibits tenderness.  Right-sided chest is tender to palpation  Abdominal: Soft. There is no tenderness. There is no rebound and no guarding.  Musculoskeletal: Normal range of motion. She exhibits no edema or tenderness.  Neurological: She is alert and oriented to person, place, and time. No cranial nerve deficit. She exhibits normal muscle tone. Coordination normal.  No ataxia on finger to nose bilaterally. No pronator drift. 5/5 strength throughout. CN 2-12 intact.Equal grip strength. Sensation intact.   Skin: Skin is warm. Capillary refill takes less than 2 seconds.  Psychiatric: She has a normal mood and affect. Her behavior is normal.  Nursing note and vitals reviewed.    ED Treatments / Results  Labs (all labs ordered are listed, but only abnormal results are displayed) Labs Reviewed  BASIC METABOLIC PANEL - Abnormal; Notable for the following components:      Result Value   Potassium  3.0 (*)    CO2 19 (*)    Creatinine, Ser 1.23 (*)    GFR calc non Af Amer 49 (*)    GFR calc Af Amer 57 (*)    Anion gap 20 (*)    All other components within normal limits  CBC - Abnormal; Notable for the following components:   WBC 17.0 (*)    All other components within normal limits  TROPONIN I - Abnormal; Notable for the following components:   Troponin I 0.04 (*)    All other components within normal limits  I-STAT BETA HCG BLOOD, ED ([facility], [facility], [facility] ONLY) - Abnormal; Notable for the following components:   I-stat hCG, quantitative 5.3 (*)    All other components within normal limits  RAPID URINE DRUG SCREEN, HOSP PERFORMED  URINALYSIS, ROUTINE W REFLEX MICROSCOPIC  D-DIMER, QUANTITATIVE (NOT AT [facility])  I-STAT TROPONIN, ED    EKG  EKG Interpretation  Date/Time:  Tuesday December 23 2017 05:22:24 EST Ventricular Rate:  94 PR Interval:    QRS Duration: 79 QT Interval:  366 QTC Calculation: 458 R Axis:   35 Text Interpretation:  Sinus rhythm Consider left atrial enlargement Abnormal R-wave progression, early transition Confirmed by Ezequiel Essex [phone]) on 12/23/2017 5:31:18 AM       Radiology Dg Chest 2 View  Result Date: 12/23/2017 CLINICAL DATA:  Acute onset of central chest pain.  Cough. EXAM: CHEST  2 VIEW COMPARISON:  Chest radiograph performed 12/19/2017 FINDINGS: The lungs are well-aerated and clear. There is no evidence of focal opacification, pleural effusion or pneumothorax. The heart is normal in size; the mediastinal contour is within normal limits. No acute osseous abnormalities are seen. IMPRESSION: No acute cardiopulmonary process seen. Electronically Signed   By: Garald Balding M.D.   On: 12/23/2017 05:17    Procedures Procedures (including critical care time)  Medications Ordered in ED Medications  LORazepam (ATIVAN) injection 1 mg (not administered)     Initial Impression / Assessment and Plan / ED Course  I have reviewed the triage vital signs  and the nursing notes.  Pertinent labs & imaging results that were available during my care of the patient were reviewed by me and considered in my medical decision making (see chart for details).    Patient presents with 90 minutes of chest pain that onset while  she was smoking cigarettes.  Associated with coughing and hyperventilation.  EKG is normal sinus rhythm without acute ST changes.  Patient had coronary catheterization in 2017 which showed normal coronaries.  EKG shows diffuse ST elevation with hyperacute T waves in repeat.  Patient did have reassuring catheterization in 2017. CXR negative.  Discussed with Dr. Virgina Jock who is covering for Dr. Einar Gip.  He reviewed EKG and agrees not consistent with STEMI more likely pericarditis.  He is coming to evaluate patient and arranging for emergent echocardiogram.  Patient's pain is improved with Ativan.  Urine drug screen is pending though she denies any cocaine abuse.  Patient has been seen by Dr. Virgina Jock who suspects myopericarditis.  Patient did have reassuring catheterization in 2017.  CRITICAL CARE Performed by: Ezequiel Essex Total critical care time: 31 minutes Critical care time was exclusive of separately billable procedures and treating other patients. Critical care was necessary to treat or prevent imminent or life-threatening deterioration. Critical care was time spent personally by me on the following activities: development of treatment plan with patient and/or surrogate as well as nursing, discussions with consultants, evaluation of patient's response to treatment, examination of patient, obtaining history from patient or surrogate, ordering and performing treatments and interventions, ordering and review of laboratory studies, ordering and review of radiographic studies, pulse oximetry and re-evaluation of patient's condition.   Final Clinical Impressions(s) / ED Diagnoses   Final diagnoses:  None    ED Discharge  Orders    None       Satcha Storlie, Annie Main, MD 12/23/17 [phone]

## 2017-12-23 NOTE — ED Notes (Signed)
Heart Healthy Diet ordered for Lunch.

## 2017-12-23 NOTE — ED Notes (Signed)
Pt resting.

## 2017-12-23 NOTE — Progress Notes (Signed)
EKG looks much better, MD is going to D/C NTG drip and order NTG paste. We will no longer transfer the patient.

## 2017-12-23 NOTE — ED Triage Notes (Signed)
Per EMS pt from home.  One hour ago began having sharp central chest pain.  Non radiating. Diagnosed with bronchitis and is on day 4/5 of antibiotics.  Cough present.  Pt hyperventilating, complaining of "COLD" left leg.  Warm to touch. Good pulses.  Tearful.  Given 2 nitro en route with no relief.  Allergic to aspirin.

## 2017-12-23 NOTE — Progress Notes (Signed)
ANTICOAGULATION CONSULT NOTE  Pharmacy Consult:  Heparin Indication: chest pain/ACS  Allergies  Allergen Reactions  . Aspirin Hives  . Food Hives    "Regular butter"    Patient Measurements: Height: 5' 8"  (172.7 cm) IBW/kg (Calculated) : 63.9 Heparin Dosing Weight: 77 kg   Vital Signs: Temp: 99 F (37.2 C) (02/12 2043) Temp Source: Oral (02/12 2043) BP: 98/56 (02/12 2043) Pulse Rate: 88 (02/12 2043)  Labs: Recent Labs    12/23/17 0432 12/23/17 0442 12/23/17 0725 12/23/17 1525 12/23/17 1940  HGB 13.7  --   --   --   --   HCT 41.4  --   --   --   --   PLT 279  --   --   --   --   HEPARINUNFRC  --   --   --   --  <0.10*  CREATININE 1.23*  --   --   --   --   TROPONINI  --  0.04* 0.42* 7.28*  --     CrCl cannot be calculated (Unknown ideal weight.).    Assessment: 53 YOF with sudden onset of chest pain positive for cocaine. Pharmacy consulted to manage IV heparin for NSTEMI.   Heparin level is undetectable.  RN reports that infusion was interrupted frequently due to the IV location and she has been in the room to restart the infusion each time.  RN will try to place a different p-IV line.  No bleeding reported.   Goal of Therapy:  Heparin level 0.3-0.7 units/ml Monitor platelets by anticoagulation protocol: Yes    Plan:  Increase heparin gtt conservatively to 1200 units/hr Check 6 hr heparin level F/U updated weight   Phiona Ramnauth D. Mina Marble, PharmD, BCPS Pager:  [phone] 12/23/2017, 9:50 PM

## 2017-12-23 NOTE — Progress Notes (Signed)
CRITICAL VALUE ALERT  Critical Value:  Troponin 7.28  Date & Time Notied: 12/23/17 1726  Provider Notified: Dr. Virgina Jock  Orders Received/Actions taken: Will continue to monitor for chest pain and hypotension, NPO after midnight for cath tomorrow.

## 2017-12-24 ENCOUNTER — Encounter (HOSPITAL_COMMUNITY): Payer: Self-pay | Admitting: Cardiology

## 2017-12-24 ENCOUNTER — Inpatient Hospital Stay (HOSPITAL_COMMUNITY)
Admission: EM | Disposition: A | Discharge status: Home / Self Care | Payer: Self-pay | Source: Home / Self Care | Attending: Cardiology

## 2017-12-24 HISTORY — PX: LEFT HEART CATH AND CORONARY ANGIOGRAPHY: CATH118249

## 2017-12-24 LAB — BASIC METABOLIC PANEL
ANION GAP: 12 (ref 5–15)
BUN: 16 mg/dL (ref 6–20)
CALCIUM: 8.6 mg/dL — AB (ref 8.9–10.3)
CO2: 24 mmol/L (ref 22–32)
Chloride: 104 mmol/L (ref 101–111)
Creatinine, Ser: 1.14 mg/dL — ABNORMAL HIGH (ref 0.44–1.00)
GFR calc non Af Amer: 54 mL/min — ABNORMAL LOW (ref >60)
Glucose, Bld: 86 mg/dL (ref 65–99)
POTASSIUM: 2.9 mmol/L — AB (ref 3.5–5.1)
Sodium: 140 mmol/L (ref 135–145)

## 2017-12-24 LAB — CBC WITH DIFFERENTIAL/PLATELET
BASOS ABS: 0 10*3/uL (ref 0.0–0.1)
BASOS PCT: 0 %
Eosinophils Absolute: 0.1 10*3/uL (ref 0.0–0.7)
Eosinophils Relative: 1 %
HEMATOCRIT: 38 % (ref 36.0–46.0)
HEMOGLOBIN: 12.1 g/dL (ref 12.0–15.0)
Lymphocytes Relative: 46 %
Lymphs Abs: 4.6 10*3/uL — ABNORMAL HIGH (ref 0.7–4.0)
MCH: 27.9 pg (ref 26.0–34.0)
MCHC: 31.8 g/dL (ref 30.0–36.0)
MCV: 87.8 fL (ref 78.0–100.0)
MONOS PCT: 8 %
Monocytes Absolute: 0.8 10*3/uL (ref 0.1–1.0)
NEUTROS ABS: 4.4 10*3/uL (ref 1.7–7.7)
NEUTROS PCT: 45 %
Platelets: 309 10*3/uL (ref 150–400)
RBC: 4.33 MIL/uL (ref 3.87–5.11)
RDW: 14.1 % (ref 11.5–15.5)
WBC: 9.9 10*3/uL (ref 4.0–10.5)

## 2017-12-24 LAB — HEPARIN LEVEL (UNFRACTIONATED): Heparin Unfractionated: 0.31 IU/mL (ref 0.30–0.70)

## 2017-12-24 LAB — PROTIME-INR
INR: 0.99
PROTHROMBIN TIME: 13 s (ref 11.4–15.2)

## 2017-12-24 SURGERY — LEFT HEART CATH AND CORONARY ANGIOGRAPHY
Anesthesia: LOCAL

## 2017-12-24 MED ORDER — IOPAMIDOL (ISOVUE-370) INJECTION 76%
INTRAVENOUS | Status: AC
  Filled 2017-12-24: qty 100

## 2017-12-24 MED ORDER — ONDANSETRON HCL 4 MG/2ML IJ SOLN: 4.0000 mg | Freq: Four times a day (QID) | INTRAMUSCULAR | Status: DC | PRN

## 2017-12-24 MED ORDER — VERAPAMIL HCL 2.5 MG/ML IV SOLN
INTRAVENOUS | Status: AC
  Filled 2017-12-24: qty 2

## 2017-12-24 MED ORDER — SODIUM CHLORIDE 0.9 % IV SOLN: 250.0000 mL | INTRAVENOUS | Status: DC | PRN

## 2017-12-24 MED ORDER — HEPARIN (PORCINE) IN NACL 2-0.9 UNIT/ML-% IJ SOLN
INTRAMUSCULAR | Status: AC
  Filled 2017-12-24: qty 1000

## 2017-12-24 MED ORDER — LIDOCAINE HCL (PF) 1 % IJ SOLN
INTRAMUSCULAR | Status: DC | PRN
  Administered 2017-12-24: 3 mL

## 2017-12-24 MED ORDER — HEPARIN SODIUM (PORCINE) 1000 UNIT/ML IJ SOLN
INTRAMUSCULAR | Status: DC | PRN
  Administered 2017-12-24: 4500 [IU] via INTRAVENOUS

## 2017-12-24 MED ORDER — SODIUM CHLORIDE 0.9 % IV SOLN
INTRAVENOUS | Status: AC | PRN
  Administered 2017-12-24: 50 mL/h via INTRAVENOUS

## 2017-12-24 MED ORDER — MIDAZOLAM HCL 2 MG/2ML IJ SOLN
INTRAMUSCULAR | Status: AC
  Filled 2017-12-24: qty 2

## 2017-12-24 MED ORDER — LIDOCAINE HCL (PF) 1 % IJ SOLN
INTRAMUSCULAR | Status: AC
Start: 2017-12-24 — End: ?
  Filled 2017-12-24: qty 30

## 2017-12-24 MED ORDER — SODIUM CHLORIDE 0.9 % IV SOLN
INTRAVENOUS | Status: AC
  Administered 2017-12-24: 10:00:00 via INTRAVENOUS

## 2017-12-24 MED ORDER — HEPARIN (PORCINE) IN NACL 2-0.9 UNIT/ML-% IJ SOLN
INTRAMUSCULAR | Status: AC | PRN
  Administered 2017-12-24 (×2): 500 mL via INTRA_ARTERIAL

## 2017-12-24 MED ORDER — ONDANSETRON HCL 4 MG/2ML IJ SOLN: 4.0000 mg | Freq: Four times a day (QID) | INTRAMUSCULAR | Status: DC

## 2017-12-24 MED ORDER — FENTANYL CITRATE (PF) 100 MCG/2ML IJ SOLN
INTRAMUSCULAR | Status: AC
Start: 2017-12-24 — End: ?
  Filled 2017-12-24: qty 2

## 2017-12-24 MED ORDER — CLOPIDOGREL BISULFATE 75 MG PO TABS
75.0000 mg | ORAL_TABLET | ORAL | Status: AC
  Administered 2017-12-24: 75 mg via ORAL

## 2017-12-24 MED ORDER — HEPARIN SODIUM (PORCINE) 1000 UNIT/ML IJ SOLN
INTRAMUSCULAR | Status: AC
  Filled 2017-12-24: qty 1

## 2017-12-24 MED ORDER — ONDANSETRON HCL 4 MG/2ML IJ SOLN
4.0000 mg | Freq: Four times a day (QID) | INTRAMUSCULAR | Status: DC | PRN
  Administered 2017-12-24: 4 mg via INTRAVENOUS
  Filled 2017-12-24: qty 2

## 2017-12-24 MED ORDER — VERAPAMIL HCL 2.5 MG/ML IV SOLN
INTRA_ARTERIAL | Status: DC | PRN
  Administered 2017-12-24: 10 mL via INTRA_ARTERIAL

## 2017-12-24 MED ORDER — SODIUM CHLORIDE 0.9% FLUSH: 3.0000 mL | INTRAVENOUS | Status: DC | PRN

## 2017-12-24 MED ORDER — HEPARIN (PORCINE) IN NACL 100-0.45 UNIT/ML-% IJ SOLN
1200.0000 [IU]/h | INTRAMUSCULAR | Status: DC
  Administered 2017-12-24: 1200 [IU]/h via INTRAVENOUS
  Filled 2017-12-24: qty 250

## 2017-12-24 MED ORDER — IOPAMIDOL (ISOVUE-370) INJECTION 76%
INTRAVENOUS | Status: DC | PRN
  Administered 2017-12-24: 35 mL via INTRA_ARTERIAL

## 2017-12-24 MED ORDER — SODIUM CHLORIDE 0.9% FLUSH
3.0000 mL | Freq: Two times a day (BID) | INTRAVENOUS | Status: DC
  Administered 2017-12-24 – 2017-12-26 (×4): 3 mL via INTRAVENOUS

## 2017-12-24 MED ORDER — FENTANYL CITRATE (PF) 100 MCG/2ML IJ SOLN
INTRAMUSCULAR | Status: DC | PRN
  Administered 2017-12-24: 25 ug via INTRAVENOUS

## 2017-12-24 MED ORDER — NITROGLYCERIN 1 MG/10 ML FOR IR/CATH LAB
INTRA_ARTERIAL | Status: AC
  Filled 2017-12-24: qty 10

## 2017-12-24 MED ORDER — MIDAZOLAM HCL 2 MG/2ML IJ SOLN
INTRAMUSCULAR | Status: DC | PRN
  Administered 2017-12-24: 1 mg via INTRAVENOUS

## 2017-12-24 MED ORDER — ACETAMINOPHEN 325 MG PO TABS: 650.0000 mg | ORAL_TABLET | ORAL | Status: DC | PRN

## 2017-12-24 SURGICAL SUPPLY — 10 items
CATH INFINITI 5 FR JL3.5 (CATHETERS) ×2 IMPLANT
CATH OPTITORQUE TIG 4.0 5F (CATHETERS) ×2 IMPLANT
DEVICE RAD COMP TR BAND LRG (VASCULAR PRODUCTS) ×2 IMPLANT
GLIDESHEATH SLEND A-KIT 6F 22G (SHEATH) ×2 IMPLANT
GUIDEWIRE INQWIRE 1.5J.035X260 (WIRE) ×1 IMPLANT
INQWIRE 1.5J .035X260CM (WIRE) ×2
KIT HEART LEFT (KITS) ×2 IMPLANT
PACK CARDIAC CATHETERIZATION (CUSTOM PROCEDURE TRAY) ×2 IMPLANT
TRANSDUCER W/STOPCOCK (MISCELLANEOUS) ×2 IMPLANT
TUBING CIL FLEX 10 FLL-RA (TUBING) ×2 IMPLANT

## 2017-12-24 NOTE — Plan of Care (Signed)
  Health Behavior/Discharge Planning: Ability to manage health-related needs will improve 12/24/2017 0853 - Not Progressing by Imagene Gurney, RN

## 2017-12-24 NOTE — Progress Notes (Signed)
Right radial TR band removed without further complication.  Level 0.  Tegaderm + gauze applied.

## 2017-12-24 NOTE — Progress Notes (Addendum)
7915 - TR band placed in cath lab with 11cc air. 1040 -  3cc air removed from TR band, Level 0/; 8cc air remaining. 1050 - 3cc REPLACED in TR band r/;t bleed, Level 1/; 11cc air remaining.  Pt stable.  Will continue to monitor.

## 2017-12-24 NOTE — Interval H&P Note (Signed)
History and Physical Interval Note:  12/24/2017 8:30 AM  Denise Knight  has presented today for surgery, with the diagnosis of unstable angina  The various methods of treatment have been discussed with the patient and family. After consideration of risks, benefits and other options for treatment, the patient has consented to  Procedure(s): LEFT HEART CATH AND CORONARY ANGIOGRAPHY (N/A) as a surgical intervention .  The patient's history has been reviewed, patient examined, no change in status, stable for surgery.  I have reviewed the patient's chart and labs.  Questions were answered to the patient's satisfaction.     Cath Lab Visit (complete for each Cath Lab visit)  Clinical Evaluation Leading to the Procedure:   ACS: Yes.    Non-ACS:    Anginal Classification: CCS IV  Anti-ischemic medical therapy: Minimal Therapy (1 class of medications)  Non-Invasive Test Results: No non-invasive testing performed  Prior CABG: No previous CABG        Denise Knight J Ardeth Repetto

## 2017-12-24 NOTE — Progress Notes (Signed)
Subjective:  No chest pain Breathing stable  S/p coronary angiogram. Details below.   Objective:  Vital Signs in the last 24 hours: Temp:  [97.8 F (36.6 C)-99 F (37.2 C)] 98.1 F (36.7 C) (02/13 0751) Pulse Rate:  [64-117] 64 (02/13 0903) Resp:  [11-28] 11 (02/13 0903) BP: (98-127)/(55-103) 118/87 (02/13 0903) SpO2:  [95 %-100 %] 100 % (02/13 0903) Weight:  [76.2 kg (168 lb 1.6 oz)] 76.2 kg (168 lb 1.6 oz) (02/13 0544)  Intake/Output from previous day: 02/12 0701 - 02/13 0700 In: 966.7 [P.O.:120; I.V.:846.7] Out: -  Intake/Output from this shift: No intake/output data recorded.    Physical Exam: Nursing note and vitals reviewed. Constitutional: She is oriented to person, place, and time. She appears well-developed and well-nourished.  HENT:  Head: Normocephalic and atraumatic.  Eyes: Conjunctivae are normal. Pupils are equal, round, and reactive to light.  Neck: Normal range of motion. Neck supple. No JVD present.  Cardiovascular: Normal rate and regular rhythm.  No murmur heard. Respiratory: Effort normal and breath sounds normal. No respiratory distress. She has no wheezes. She has no rales. She exhibits no tenderness.  GI: Soft. Bowel sounds are normal. There is no tenderness.  Musculoskeletal: Normal range of motion. She exhibits no edema.  Lymphadenopathy:    She has no cervical adenopathy.  Neurological: She is alert and oriented to person, place, and time. No cranial nerve deficit.  Skin: Skin is warm and dry.  Psychiatric: Anxious, tearful    Lab Results: Recent Labs    12/23/17 0432 12/24/17 0445  WBC 17.0* 9.9  HGB 13.7 12.1  PLT 279 309   Recent Labs    12/23/17 0432 12/24/17 0445  NA 140 140  K 3.0* 2.9*  CL 101 104  CO2 19* 24  GLUCOSE 84 86  BUN 17 16  CREATININE 1.23* 1.14*   Recent Labs    12/23/17 0725 12/23/17 1525  TROPONINI 0.42* 7.28*   Imaging:   Cardiac Studies: Echocardiogram 12/23/2017: Study Conclusions  -  Left ventricle: The cavity size was normal. The estimated   ejection fraction was in the range of 55% to 60%. Wall motion was   normal; there were no regional wall motion abnormalities. Left   ventricular diastolic function parameters were normal. - Tricuspid valve: There was mild regurgitation. - Pulmonary arteries: PA peak pressure: 32 mm Hg (S).  EKG 12/23/2017 16:02 Sinus rhythm 72 bpm.  Normal axis.  Normal conduction. No ischemia, or infarct.  Normal EKG.  Cath 12/24/2017: Subtotal occlusion of distal apical LAD with moderate thrombus burden, most likely as a result of coronary vasospasm secondary to cocaine. No lesion amenable to PCI. Thrombus likely organize at this point with completed infarct. TIMI II flow in RCA without any significant stenosis.  Recommendation: Given the timeline of her presentation, and organized thrombus, benefit of aspiration Ectopy did not outweigh the risks. Recommended medical management with Plavix and heparin. Strongly emphasized importance of complete abstinence from cocaine, and importance of tobacco cessation.   Assessment:  52 year old African-American female   NSTEMI: Distal apical infarct with subotal occlusion and moderate heavy thrombus. No lesion amenable to PCI.  Likely culprit's cocaine induced severe vasospasm or plaque rupture. H/o hypertension: Currently normotensive H/o psychiatric illnesses: Schizophrenia, bipolar disorder, depression. Currently stable Tobacco abuse: On nicotine patch Hypotension: Improved with IV fluids AKI: Improved with IV fluids  Plan: Recommend IV heparin for a total of 48 hours.  Recommend Plavix 75 mg daily. Substance abuse consult. Strongly emphasized  importance of complete abstinence from cocaine, and importance of tobacco cessation.      LOS: 0 days    Denise Knight 12/24/2017, 9:22 AM  [place], MD [facility] Pager: [phone] Office: [phone] If  no answer Cell [phone]

## 2017-12-24 NOTE — Progress Notes (Addendum)
ANTICOAGULATION CONSULT NOTE  Pharmacy Consult:  Heparin Indication: chest pain/ACS  Allergies  Allergen Reactions  . Aspirin Hives  . Food Hives    "Regular butter"    Patient Measurements: Height: 5' 8"  (172.7 cm) Weight: 168 lb 1.6 oz (76.2 kg) IBW/kg (Calculated) : 63.9 Heparin Dosing Weight: 77 kg   Vital Signs: Temp: 98.1 F (36.7 C) (02/13 0751) Temp Source: Oral (02/13 0751) BP: 118/87 (02/13 0903) Pulse Rate: 64 (02/13 0903)  Labs: Recent Labs    12/23/17 0432 12/23/17 0442 12/23/17 0725 12/23/17 1525 12/23/17 1940 12/24/17 0445  HGB 13.7  --   --   --   --  12.1  HCT 41.4  --   --   --   --  38.0  PLT 279  --   --   --   --  309  LABPROT  --   --   --   --   --  13.0  INR  --   --   --   --   --  0.99  HEPARINUNFRC  --   --   --   --  <0.10* 0.31  CREATININE 1.23*  --   --   --   --  1.14*  TROPONINI  --  0.04* 0.42* 7.28*  --   --    Estimated Creatinine Clearance: 57.6 mL/min (A) (by C-G formula based on SCr of 1.14 mg/dL (H)).  Assessment: 81 YOF with sudden onset of chest pain positive for cocaine. Pharmacy consulted to manage IV heparin for NSTEMI. Patient taken for cath earlier today and TR band still in place. Will plan to start heparin ~ 2 hours post TR band removal as RN reports significant bleeding when she attempted to remove band earlier. Cardiology would like heparin infusion for a total of 48 hours (heparin to stop 2/14)  Goal of Therapy:  Heparin level 0.3-0.7 units/ml Monitor platelets by anticoagulation protocol: Yes   Plan:  Resume heparin infusion at 1200 units/hr at ~1500 today Order heparin confirmatory as appropriate Daily heparin level and CBC Monitor for s/sx of bleeding   Georga Bora, PharmD Clinical Pharmacist 12/24/2017 1:00 PM

## 2017-12-24 NOTE — Plan of Care (Signed)
Patient admitted with chest pain. Her chest pain decrease throughout the shift. The patient verbalized the pain not being "bad" but rated it 8 or 9. Continued to monitor patients pain throughout the night.

## 2017-12-25 ENCOUNTER — Encounter (HOSPITAL_COMMUNITY): Payer: Self-pay | Admitting: Internal Medicine

## 2017-12-25 DIAGNOSIS — K219 Gastro-esophageal reflux disease without esophagitis: Secondary | ICD-10-CM

## 2017-12-25 DIAGNOSIS — Z86010 Personal history of colonic polyps: Secondary | ICD-10-CM

## 2017-12-25 DIAGNOSIS — F1410 Cocaine abuse, uncomplicated: Secondary | ICD-10-CM

## 2017-12-25 LAB — CBC
HCT: 35.1 % — ABNORMAL LOW (ref 36.0–46.0)
Hemoglobin: 11.2 g/dL — ABNORMAL LOW (ref 12.0–15.0)
MCH: 28.2 pg (ref 26.0–34.0)
MCHC: 31.9 g/dL (ref 30.0–36.0)
MCV: 88.4 fL (ref 78.0–100.0)
PLATELETS: 292 10*3/uL (ref 150–400)
RBC: 3.97 MIL/uL (ref 3.87–5.11)
RDW: 14.2 % (ref 11.5–15.5)
WBC: 8.3 10*3/uL (ref 4.0–10.5)

## 2017-12-25 LAB — HEPARIN LEVEL (UNFRACTIONATED): Heparin Unfractionated: 0.38 IU/mL (ref 0.30–0.70)

## 2017-12-25 MED ORDER — POTASSIUM CHLORIDE CRYS ER 20 MEQ PO TBCR
40.0000 meq | EXTENDED_RELEASE_TABLET | Freq: Two times a day (BID) | ORAL | Status: DC
  Administered 2017-12-25 – 2017-12-26 (×3): 40 meq via ORAL
  Filled 2017-12-25 (×3): qty 2

## 2017-12-25 MED ORDER — POTASSIUM CHLORIDE CRYS ER 20 MEQ PO TBCR
40.0000 meq | EXTENDED_RELEASE_TABLET | Freq: Once | ORAL | Status: AC
  Administered 2017-12-25: 40 meq via ORAL
  Filled 2017-12-25: qty 2

## 2017-12-25 MED FILL — Heparin Sodium (Porcine) 2 Unit/ML in Sodium Chloride 0.9%: INTRAMUSCULAR | Qty: 1000 | Status: AC

## 2017-12-25 NOTE — Progress Notes (Signed)
Patient verbalized noticing blood in her stool. She flushed it before allowing me to assess it. Patient seems asymptomatic. She denies having any pain. Will continue to monitor for additional bloody stools and patient's safety.

## 2017-12-25 NOTE — Progress Notes (Signed)
Patient had dark red blood in her stool.  MD notified, new order to discontinue heparin. Marcille Blanco, RN

## 2017-12-25 NOTE — Progress Notes (Signed)
Upon reporting off at bedside-noted that patient had a second bloody stool. Both RN's saw this BM. Patient is asymptomatic. Day RN will continue care by reaching out to MD. Patient is currently stable.

## 2017-12-25 NOTE — Consult Note (Signed)
[facility] Consult: 6:48 PM 12/25/2017  LOS: 1 day    Referring Provider: Dr. Virgina Jock Primary Care Physician:  Med[facility], [facility] Primary Gastroenterologist:  Dr Ardis Hughs      Reason for Consultation: Hematochezia.   HPI: Denise Knight is a 52 y.o. female.  Bipolar, anxiety.  Hypertension.  Schizophrenia.  Peptic ulcer disease.  Status post cardiac catheterization 06/2016 for elevated troponins, no disease seen on cath. Colonoscopy ~ 2007, 2 polyps removed; 2012, 3 polyps removed in [facility].  Family history of colon cancer in her mother. On OV in 03/2016 Dr. Ardis Hughs recommended colonoscopy for surveillance and because she was FOBT positive.  He recommended she undergo cardiac clearance and she never ended up pursuing colonoscopy and was lost to follow-up.  She says she does not have insurance and she never returned to GI because of this.  Visits the ED quite frequently.  Planes include right-sided chest pain, rectal bleeding, headaches, dizziness, pain at the umbilicus, hand fracture,  She reports episodes of passing blood per rectum in June and December 2018.  CT scan 05/2017 showed uterine fibroids and incidental arthritis in the right hip.  CT scan 10/29/17 unremarkable.  She was encouraged to follow-up with GI and, because she had hemorrhoids, to follow-up with surgery.  sge did not follow up.  FOBT + 04/2017, 10/2017 Treated for bronchitis at the urgent care center on 12/19/17.  Admitted 12/23/17 with chest pain.  She ruled in for acute MI.  This was attributed to vasospasm from cocaine use.  Cardiac catheterization yesterday showed subtotal occlusion of the distal apical LAD with moderate burden of thrombus.  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  Patient was  started on Plavix and heparin. She has developed recurrent hematochezia, 2 episodes last night, 3 so far today.  There is some slight discomfort in her upper abdomen but no significant abdominal pain.  No nausea or vomiting.  Her appetite is good.  No change in bowel habits.  Other than the occasional bleeding per rectum, she has not had any melenic stools.  No significant weight loss.  No anorexia.  No dysphagia.  No prior EGD.  There is mention of history of ulcer disease but this is not verified.  Apparently some provider many years ago told her that she had ulcers but no radiologic or endoscopic tests were performed to actually confirm this.  She does take omeprazole 20 mg daily faithfully.  Does not use nonsteroidal anti-inflammatories or aspirin products.  Hgb 12.1  >> 11.2, MCV normal.  Troponins as high as 7.28 on 12/23/17 Toxic screen positive for cocaine, benzos, opiates.  Interestingly she says she was not aware that she had used cocaine, she says she bumped a cigarette from a friend of hers which she feels might of been laced with cocaine, she has not spoken to the friend to verify this.  She also does not endorse taking any opiates or benzos.  Patient drinks about 80 ounces of ice beer daily.   Past Medical History:  Diagnosis Date  . Anxiety   . Arthritis    "left knee" (07/05/2016)  . Asthma   . Bipolar 1 disorder ([place])   . Celiac disease    pt denies this hx on 07/05/2016  . Depression   . GERD (gastroesophageal reflux disease)   . Hypertension   . MI (mitral incompetence)   . Post traumatic stress disorder   . Schizophrenia ([place])   . Stomach ulcer     Past Surgical History:  Procedure Laterality Date  . CARDIAC CATHETERIZATION  07/05/2016  . CARDIAC CATHETERIZATION N/A 07/05/2016   Procedure: Left Heart Cath and Coronary Angiography;  Surgeon: Adrian Prows, MD;  Location: [facility];  Service: Cardiovascular;  Laterality: N/A;  . CYST EXCISION Right    "wrist"  .  DILATION AND CURETTAGE OF UTERUS    . FOOT SURGERY Bilateral    "corns removed"  . LEFT HEART CATH AND CORONARY ANGIOGRAPHY N/A 12/24/2017   Procedure: LEFT HEART CATH AND CORONARY ANGIOGRAPHY;  Surgeon: Nigel Mormon, MD;  Location: [facility];  Service: Cardiovascular;  Laterality: N/A;  . TUBAL LIGATION      Prior to Admission medications   Medication Sig Start Date End Date Taking? Authorizing Provider  acetaminophen (TYLENOL) 500 MG tablet Take 1,000 mg by mouth every 4 (four) hours as needed for moderate pain or headache.   Yes [provider]  albuterol (PROVENTIL HFA;VENTOLIN HFA) 108 (90 BASE) MCG/ACT inhaler Inhale 2 puffs into the lungs every 6 (six) hours as needed for wheezing or shortness of breath.   Yes [provider]  azithromycin (ZITHROMAX) 250 MG tablet Take 1 tablet (250 mg total) by mouth daily. Take first 2 tablets together, then 1 every day until finished. 12/19/17  Yes Blue, Olivia C, PA-C  benzonatate (TESSALON) 100 MG capsule Take 1 capsule (100 mg total) by mouth every 8 (eight) hours. 12/19/17  Yes Blue, Olivia C, PA-C  hydrochlorothiazide (HYDRODIURIL) 25 MG tablet Take 25 mg by mouth daily.   Yes [provider]  loratadine (CLARITIN) 10 MG tablet Take 1 tablet (10 mg total) by mouth daily. 06/16/17  Yes Nedrud, Larena Glassman, MD  omeprazole (PRILOSEC) 20 MG capsule Take 20 mg by mouth daily.    Yes [provider]  diclofenac sodium (VOLTAREN) 1 % GEL Apply 2 g topically 4 (four) times daily as needed (MSK pain associated with cough). Patient not taking: Reported on 10/29/2017 06/16/17   Thomasene Ripple, MD  guaiFENesin-codeine 100-10 MG/5ML syrup Take 5 mLs by mouth every 4 (four) hours as needed for cough. Patient not taking: Reported on 08/18/2017 06/16/17   Thomasene Ripple, MD  hydrocortisone (ANUSOL-HC) 2.5 % rectal cream Apply rectally 2 times daily Patient not taking: Reported on 12/23/2017 10/29/17   Ocie Cornfield  T, PA-C  Lidocaine, Anorectal, 5 % GEL Apply a pea-sized amount to the affected area 3 times daily. Patient not taking: Reported on 12/23/2017 10/29/17   Ocie Cornfield T, PA-C  meloxicam (MOBIC) 7.5 MG tablet Take 1 tablet (7.5 mg total) by mouth daily. Patient not taking: Reported on 08/18/2017 07/14/17   Langston Masker B, PA-C  nicotine (NICODERM CQ - DOSED IN MG/24 HR) 7 mg/24hr patch Place 1 patch (7 mg total) onto the skin daily. Patient not taking: Reported on 08/18/2017 06/17/17   Thomasene Ripple, MD  oxyCODONE-acetaminophen (PERCOCET) 5-325 MG tablet Take 1-2 tablets by mouth every 6 (six) hours as needed. Patient not taking: Reported on 08/18/2017 07/10/17  Little, Wenda Overland, MD    Scheduled Meds: . Influenza vac split quadrivalent PF  0.5 mL Intramuscular Tomorrow-1000  . loratadine  10 mg Oral Daily  . nicotine  7 mg Transdermal Daily  . pantoprazole  40 mg Oral Daily  . potassium chloride  40 mEq Oral BID  . sodium chloride flush  3 mL Intravenous Q12H   Infusions: . sodium chloride 75 mL/hr at 12/25/17 1745  . sodium chloride     PRN Meds: sodium chloride, acetaminophen, albuterol, guaiFENesin-codeine, ibuprofen, nitroGLYCERIN, ondansetron (ZOFRAN) IV, sodium chloride flush   Allergies as of 12/23/2017 - Review Complete 12/23/2017  Allergen Reaction Noted  . Aspirin Hives 03/17/2014  . Food Hives 06/21/2015    Family History  Problem Relation Age of Onset  . Breast cancer Unknown   . Lung cancer Unknown   . Congestive Heart Failure Unknown   . Diabetes Unknown   . Hypertension Unknown     Social History   Socioeconomic History  . Marital status: Widowed    Spouse name: Not on file  . Number of children: 2  . Years of education: Not on file  . Highest education level: Not on file  Social Needs  . Financial resource strain: Not on file  . Food insecurity - worry: Not on file  . Food insecurity - inability: Not on file  . Transportation needs -  medical: Not on file  . Transportation needs - non-medical: Not on file  Occupational History  . Occupation: disabled   Tobacco Use  . Smoking status: Current Some Day Smoker    Packs/day: 0.25    Years: 35.00    Pack years: 8.75    Types: Cigarettes  . Smokeless tobacco: Never Used  Substance and Sexual Activity  . Alcohol use: Yes    Alcohol/week: 4.2 oz    Types: 7 Cans of beer per week  . Drug use: Yes    Types: Cocaine    Comment: last used cocaine 2014  . Sexual activity: Not Currently    Birth control/protection: Surgical  Other Topics Concern  . Not on file  Social History Narrative  . Not on file    REVIEW OF SYSTEMS: Constitutional: No weakness or fatigue ENT:  No nose bleeds Pulm: Getting over the cough.  She still is producing some clear to yellowish sputum.  No significant DOE. CV:  No palpitations, no LE edema.  The chest pain she had coming into the hospital has resolved. GU:  No hematuria, no frequency GI:  Per HPI Heme: Denies excessive or unusual bleeding or bruising.  She used to take oral iron and B12 and had a history of menorrhagia which has resolved. Transfusions: Has never been transfused before the last 24 hours Neuro:  No headaches, no peripheral tingling or numbness Derm:  No itching, no rash or sores.  Endocrine:  No sweats or chills.  No polyuria or dysuria Immunization: Received Pneumovax today. Travel:  None beyond local counties in last few months.    PHYSICAL EXAM: Vital signs in last 24 hours: Vitals:   12/25/17 0909 12/25/17 1614  BP: 121/86 (!) 146/96  Pulse: 85 78  Resp:  18  Temp:    SpO2:  99%   Wt Readings from Last 3 Encounters:  12/25/17 77.4 kg (170 lb 9.6 oz)  10/29/17 77.1 kg (170 lb)  08/18/17 78.5 kg (173 lb)    General: Pleasant, calm, alert, comfortable, well-appearing AAF Head: No facial asymmetry or swelling.  No signs of head trauma. Eyes: No scleral icterus.  No conjunctival pallor.  EOMI. Ears: Not hard  of hearing Nose: No congestion or discharge. Mouth: Oral mucosa is pink, moist, clear.  Tongue midline.  Fair to good dentition. Neck: No JVD, no masses, no thyromegaly, no bruits. Lungs: Clear bilaterally.  Does have a slightly rough sounding cough. Heart: RRR.  No MRG.  S1, S2 present Abdomen: Soft.  Nonobese.  Active bowel sounds.  No HSM, masses, bruits, hernias.  Minor upper abdominal tenderness..   Rectal: There are external hemorrhoidal tags and palpable internal hemorrhoids.  Rectum is a bit tender on DRE.  No stool or blood on the exam glove.  No fissures or ulcers visible Musc/Skeltl: No joint swelling, redness or gross deformity. Extremities: No CCE. Neurologic: Alert.  Oriented x3.  Moves all 4 limbs with full strength.  No tremor.  Grossly neurologically intact. Skin: No rashes, no sores or suspicious lesions. Nodes: No cervical or inguinal adenopathy. Psych: Pleasant, calm, fluid speech.  Intake/Output from previous day: 02/13 0701 - 02/14 0700 In: 1707.3 [P.O.:175; I.V.:1532.3] Out: 2050 [Urine:2050]  LAB RESULTS: Recent Labs    12/23/17 0432 12/24/17 0445 12/25/17 0627  WBC 17.0* 9.9 8.3  HGB 13.7 12.1 11.2*  HCT 41.4 38.0 35.1*  PLT 279 309 292   BMET Lab Results  Component Value Date   NA 140 12/24/2017   NA 140 12/23/2017   NA 138 12/18/2017   K 2.9 (L) 12/24/2017   K 3.0 (L) 12/23/2017   K 3.1 (L) 12/18/2017   CL 104 12/24/2017   CL 101 12/23/2017   CL 104 12/18/2017   CO2 24 12/24/2017   CO2 19 (L) 12/23/2017   CO2 19 (L) 12/18/2017   GLUCOSE 86 12/24/2017   GLUCOSE 84 12/23/2017   GLUCOSE 103 (H) 12/18/2017   BUN 16 12/24/2017   BUN 17 12/23/2017   BUN 6 12/18/2017   CREATININE 1.14 (H) 12/24/2017   CREATININE 1.23 (H) 12/23/2017   CREATININE 0.95 12/18/2017   CALCIUM 8.6 (L) 12/24/2017   CALCIUM 9.7 12/23/2017   CALCIUM 9.2 12/18/2017   LFT No results for input(s): PROT, ALBUMIN, AST, ALT, ALKPHOS, BILITOT, BILIDIR, IBILI in the last  72 hours. PT/INR Lab Results  Component Value Date   INR 0.99 12/24/2017   Lipase     Component Value Date/Time   LIPASE 22 12/18/2017 0850    Drugs of Abuse     Component Value Date/Time   LABOPIA POSITIVE (A) 12/23/2017 0929   COCAINSCRNUR POSITIVE (A) 12/23/2017 0929   LABBENZ POSITIVE (A) 12/23/2017 0929   AMPHETMU NONE DETECTED 12/23/2017 0929   THCU NONE DETECTED 12/23/2017 0929   LABBARB NONE DETECTED 12/23/2017 0929     RADIOLOGY STUDIES: No results found.   IMPRESSION:   *Acute rectal bleeding in patient with history of previous periodic rectal bleeding.  This may be from hemorrhoids.  She has a history of recurrent colon polyps on her 2 colonoscopies of ~ 2008 and 2012, although polyp type/pathology not known.  Also has family history in her mother of colon cancer.  Fortunately she has not had a significant drop in her Hgb as a result of the bleeding. 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  Heparin and Plavix initiated but now discontinued because of the bleeding.  *   Toxicology screen positive for cocaine, opiates and benzodiazepines all of which she is unaware of having  consumed.  Has an interesting story as to how she possibly smoked cocaine.  *   GERD.  Takes omeprazole 20 mg daily at home.  Currently on Protonix oral.   PLAN:     *  Needs colonoscopy.  Will discuss with Dr. 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   Azucena Freed PA-C  12/25/2017,  Pager: [facility] Attending   I have taken an interval history, reviewed the chart and examined the patient. I agree with the Advanced Practitioner's note, impression and recommendations.    I recommend we see what happens to bleeding off anticoagulants/anti-PLT's  Given MI and no reperfusion would  prefer to delay colonoscopy x weeks  Clinical course will determine what she needs while in hospital  Gatha Mayer, MD, [facility] [phone] (pager) 12/25/2017 6:50 PM

## 2017-12-25 NOTE — Progress Notes (Signed)
Subjective:  No chest pain Breathing stable 2 episodes of bloody bowel movements since yesterday.   Objective:  Vital Signs in the last 24 hours: Temp:  [98 F (36.7 C)-98.1 F (36.7 C)] 98 F (36.7 C) (02/14 0603) Pulse Rate:  [72-88] 85 (02/14 0909) Resp:  [18] 18 (02/14 0603) BP: (102-121)/(71-86) 121/86 (02/14 0909) SpO2:  [98 %-100 %] 98 % (02/14 0603) Weight:  [77.4 kg (170 lb 9.6 oz)] 77.4 kg (170 lb 9.6 oz) (02/14 0603)  Intake/Output from previous day: 02/13 0701 - 02/14 0700 In: 1707.3 [P.O.:175; I.V.:1532.3] Out: 2050 [Urine:2050] Intake/Output from this shift: Total I/O In: 240 [P.O.:240] Out: -     Physical Exam: Nursing note and vitals reviewed. Constitutional: She is oriented to person, place, and time. She appears well-developed and well-nourished.  HENT:  Head: Normocephalic and atraumatic.  Eyes: Conjunctivae are normal. Pupils are equal, round, and reactive to light.  Neck: Normal range of motion. Neck supple. No JVD present.  Cardiovascular: Normal rate and regular rhythm.  No murmur heard. Respiratory: Effort normal and breath sounds normal. No respiratory distress. She has no wheezes. She has no rales. She exhibits no tenderness.  GI: Soft. Bowel sounds are normal. There is no tenderness.  Musculoskeletal: Normal range of motion. She exhibits no edema.  Lymphadenopathy:    She has no cervical adenopathy.  Neurological: She is alert and oriented to person, place, and time. No cranial nerve deficit.  Skin: Skin is warm and dry.  Psychiatric: Anxious, tearful    Lab Results: Recent Labs    12/24/17 0445 12/25/17 0627  WBC 9.9 8.3  HGB 12.1 11.2*  PLT 309 292   Recent Labs    12/23/17 0432 12/24/17 0445  NA 140 140  K 3.0* 2.9*  CL 101 104  CO2 19* 24  GLUCOSE 84 86  BUN 17 16  CREATININE 1.23* 1.14*   Recent Labs    12/23/17 0725 12/23/17 1525  TROPONINI 0.42* 7.28*   Imaging:   Cardiac Studies: Echocardiogram  12/23/2017: Study Conclusions  - Left ventricle: The cavity size was normal. The estimated   ejection fraction was in the range of 55% to 60%. Wall motion was   normal; there were no regional wall motion abnormalities. Left   ventricular diastolic function parameters were normal. - Tricuspid valve: There was mild regurgitation. - Pulmonary arteries: PA peak pressure: 32 mm Hg (S).  EKG 12/23/2017 16:02 Sinus rhythm 72 bpm.  Normal axis.  Normal conduction. No ischemia, or infarct.  Normal EKG.  Cath 12/24/2017: Subtotal occlusion of distal apical LAD with moderate thrombus burden, most likely as a result of coronary vasospasm secondary to cocaine. No lesion amenable to PCI. Thrombus likely organize at this point with completed infarct. TIMI II flow in RCA without any significant stenosis.  Recommendation: Given the timeline of her presentation, and organized thrombus, benefit of aspiration Ectopy did not outweigh the risks. Recommended medical management with Plavix and heparin. Strongly emphasized importance of complete abstinence from cocaine, and importance of tobacco cessation.   Assessment:  52 year old African-American female   [place]: Likely due to antiplatelet, anticoagulation. NSTEMI: Distal apical infarct with subotal occlusion and moderate heavy thrombus. No lesion amenable to PCI. Likely culprit's cocaine induced severe vasospasm or plaque rupture. H/o hypertension: Currently normotensive H/o psychiatric illnesses: Schizophrenia, bipolar disorder, depression. Currently stable Tobacco abuse: On nicotine patch Hypotension: Improved with IV fluids AKI: Improved with IV fluids  Plan: Heparin, plavix stopped in light of bloody bowel movement.  GI consulted. Substance abuse consult. Strongly emphasized importance of complete abstinence from cocaine, and importance of tobacco cessation.      LOS: 1 day    Mikaeel Petrow J Ama Mcmaster 12/25/2017, 12:17 PM  [facility] Esther Hardy, MD [facility] Pager: [phone] Office: [phone] If no answer Cell [phone]

## 2017-12-25 NOTE — Progress Notes (Signed)
Gave report to the oncoming RN. No patient distress noted

## 2017-12-26 DIAGNOSIS — K625 Hemorrhage of anus and rectum: Secondary | ICD-10-CM

## 2017-12-26 LAB — CBC
HCT: 36.2 % (ref 36.0–46.0)
Hemoglobin: 11.6 g/dL — ABNORMAL LOW (ref 12.0–15.0)
MCH: 28.4 pg (ref 26.0–34.0)
MCHC: 32 g/dL (ref 30.0–36.0)
MCV: 88.7 fL (ref 78.0–100.0)
PLATELETS: 321 10*3/uL (ref 150–400)
RBC: 4.08 MIL/uL (ref 3.87–5.11)
RDW: 14.4 % (ref 11.5–15.5)
WBC: 7.7 10*3/uL (ref 4.0–10.5)

## 2017-12-26 LAB — BASIC METABOLIC PANEL
ANION GAP: 8 (ref 5–15)
BUN: 7 mg/dL (ref 6–20)
CALCIUM: 9.4 mg/dL (ref 8.9–10.3)
CO2: 24 mmol/L (ref 22–32)
Chloride: 110 mmol/L (ref 101–111)
Creatinine, Ser: 0.94 mg/dL (ref 0.44–1.00)
Glucose, Bld: 95 mg/dL (ref 65–99)
POTASSIUM: 4.2 mmol/L (ref 3.5–5.1)
SODIUM: 142 mmol/L (ref 135–145)

## 2017-12-26 LAB — HEPARIN LEVEL (UNFRACTIONATED)

## 2017-12-26 MED ORDER — HYDROCORTISONE ACETATE 25 MG RE SUPP
25.0000 mg | Freq: Two times a day (BID) | RECTAL | Status: DC
  Filled 2017-12-26 (×2): qty 1

## 2017-12-26 MED ORDER — NITROGLYCERIN 0.4 MG SL SUBL: 0.4000 mg | SUBLINGUAL_TABLET | SUBLINGUAL | 1 refills | Status: DC | PRN

## 2017-12-26 NOTE — Progress Notes (Signed)
   Patient Name: Denise Knight Date of Encounter: 12/26/2017, 10:16 AM    Subjective  She has not had any BMs since yday, she remains off plavix and heparin. She passed gas today and reports frank red blood in the toilet. She feels fine/back to normal otherwise. Denies CP, SOB, abdominal pain, weakness, dizziness, N/V/D.    Objective  BP 133/86 (BP Location: Right Arm)   Pulse 72   Temp 98.4 F (36.9 C) (Oral)   Resp 18   Ht 5' 8"  (1.727 m)   Wt 77.7 kg (171 lb 3.2 oz) Comment: scale c  SpO2 97%   BMI 26.03 kg/m   General:  Calm and comfortable, NAD Eyes:   anicteric Lungs:  CTAB Heart::  S1S2 no rubs, murmurs or gallops Abdomen:  soft and nontender, BS+ Ext:   no edema, cyanosis or clubbing  Lab Results  Component Value Date   WBC 7.7 12/26/2017   HGB 11.6 (L) 12/26/2017   HCT 36.2 12/26/2017   MCV 88.7 12/26/2017   PLT 321 12/26/2017     Assessment and Plan   1. Acute rectal bleeding/hx of previous periodic rectal bleeding- likely from hemorrhoids. Hx of recurrent colon polyps in colonoscopies 2008 and 2012. Fhx of colon CA. Would prefer to see how she does off anticoag/antiplatelets before scoping her. Needs routine colonoscopy outpatient.  2. Acute NSTEMI- likely d/t cocaine induced vasospasm leading to formation of occluding thrombus and MI. Started on heparin and plavix, discontinued on 2/14 bc of rectal bleeding. Hx of HTN.  3. UDS positive for cocaine, opiates, and benzo- pt is unaware of consuming all of these.  4. GERD- omeprazole 20 mg qd at home, on protonix po inpatient   Colonoscopy to be done outpatient, appt made with [facility]- see patient navigation.   Anusol suppositories ordered- rectal bleeding could be d/t hemorrhoids.   Clear from a GI stand point to be discharged.   Reviewed with student Patient was discharged before I could see her Agree with plans  Gatha Mayer, MD, [facility]

## 2017-12-26 NOTE — Progress Notes (Signed)
Subjective:  No chest pain Breathing stable No bowel movement since 12/25/2017  Objective:  Vital Signs in the last 24 hours: Temp:  [97.6 F (36.4 C)-98.4 F (36.9 C)] 98.4 F (36.9 C) (02/15 0800) Pulse Rate:  [68-78] 72 (02/15 0800) Resp:  [18] 18 (02/15 0800) BP: (115-146)/(72-96) 133/86 (02/15 0800) SpO2:  [97 %-99 %] 97 % (02/15 0800) Weight:  [77.7 kg (171 lb 3.2 oz)] 77.7 kg (171 lb 3.2 oz) (02/15 0417)  Intake/Output from previous day: 02/14 0701 - 02/15 0700 In: 4596.3 [P.O.:840; I.V.:3756.3] Out: 1100 [Urine:1100] Intake/Output from this shift: No intake/output data recorded.    Physical Exam: Nursing note and vitals reviewed. Constitutional: She is oriented to person, place, and time. She appears well-developed and well-nourished.  HENT:  Head: Normocephalic and atraumatic.  Eyes: Conjunctivae are normal. Pupils are equal, round, and reactive to light.  Neck: Normal range of motion. Neck supple. No JVD present.  Cardiovascular: Normal rate and regular rhythm.  No murmur heard. Respiratory: Effort normal and breath sounds normal. No respiratory distress. She has no wheezes. She has no rales. She exhibits no tenderness.  GI: Soft. Bowel sounds are normal. There is no tenderness.  Musculoskeletal: Normal range of motion. She exhibits no edema.  Lymphadenopathy:    She has no cervical adenopathy.  Neurological: She is alert and oriented to person, place, and time. No cranial nerve deficit.  Skin: Skin is warm and dry.  Psychiatric: Anxious, tearful    Lab Results: Recent Labs    12/25/17 0627 12/26/17 0600  WBC 8.3 7.7  HGB 11.2* 11.6*  PLT 292 321   Recent Labs    12/24/17 0445 12/26/17 0803  NA 140 142  K 2.9* 4.2  CL 104 110  CO2 24 24  GLUCOSE 86 95  BUN 16 7  CREATININE 1.14* 0.94   Recent Labs    12/23/17 1525  TROPONINI 7.28*   Imaging:   Cardiac Studies: Echocardiogram 12/23/2017: Study Conclusions  - Left ventricle: The  cavity size was normal. The estimated   ejection fraction was in the range of 55% to 60%. Wall motion was   normal; there were no regional wall motion abnormalities. Left   ventricular diastolic function parameters were normal. - Tricuspid valve: There was mild regurgitation. - Pulmonary arteries: PA peak pressure: 32 mm Hg (S).  EKG 12/23/2017 16:02 Sinus rhythm 72 bpm.  Normal axis.  Normal conduction. No ischemia, or infarct.  Normal EKG.  Cath 12/24/2017: Subtotal occlusion of distal apical LAD with moderate thrombus burden, most likely as a result of coronary vasospasm secondary to cocaine. No lesion amenable to PCI. Thrombus likely organize at this point with completed infarct. TIMI II flow in RCA without any significant stenosis.  Recommendation: Given the timeline of her presentation, and organized thrombus, benefit of aspiration Ectopy did not outweigh the risks. Recommended medical management with Plavix and heparin. Strongly emphasized importance of complete abstinence from cocaine, and importance of tobacco cessation.   Assessment:  52 year old African-American female   GI bleeding; appreciate GI consult. No further bloody bowel movement since 12/25/2017. Plavix and heparin stopped.  Hypokalemia: Checking BMP today NSTEMI: Distal apical infarct with subotal occlusion and moderate heavy thrombus. No lesion amenable to PCI. Likely culprit's cocaine induced severe vasospasm or plaque rupture. H/o hypertension: Currently normotensive H/o psychiatric illnesses: Schizophrenia, bipolar disorder, depression. Currently stable Tobacco abuse: On nicotine patch Hypotension: Improved with IV fluids AKI: Improved with IV fluids  Plan: CBC stable. Further workup as  per GI recommendations. Home today if okayed by GI. Strongly emphasized importance of complete abstinence from cocaine, and importance of tobacco cessation.  Will arrange outpatient follow up.    LOS: 2 days     Chelby Salata J Dorma Altman 12/26/2017, 9:21 AM  [place], MD [facility] Pager: [phone] Office: [phone] If no answer Cell [phone]

## 2017-12-26 NOTE — Progress Notes (Signed)
Patient called RN into room.  Patient went to the bathroom, did not have BM, however passed gas and there is frank red blood in the toilet.  Dr. Virgina Jock made aware.  Will continue to monitor.

## 2017-12-26 NOTE — Progress Notes (Signed)
Discharge instructions given. Pt verbalized understanding and all questions were answered.  

## 2017-12-27 DIAGNOSIS — F1410 Cocaine abuse, uncomplicated: Secondary | ICD-10-CM

## 2017-12-27 DIAGNOSIS — K625 Hemorrhage of anus and rectum: Secondary | ICD-10-CM

## 2017-12-27 NOTE — Discharge Summary (Signed)
Physician Discharge Summary  Patient ID: Denise Knight MRN: [id number] DOB/AGE: 04-02-1966 52 y.o.  Admit date: 12/23/2017 Discharge date: 12/27/2017  Admission Diagnoses:  Discharge Diagnoses:  Active Problems:   NSTEMI (non-ST elevated myocardial infarction) ([place])   Cocaine abuse ([place])   Rectal bleeding   Discharged Condition: stable  Hospital Course:  52 year old African-American female with history of tobacco and substance abuse, was admitted with chest pain on 12/23/2017. Initial EKG showed diffuse mL ST elevation in all leads concerning for pericarditis. However, subsequent workup showed troponin elevation to 7 ng per mL. EF was preserved with normal wall motion. She was tested positive for cocaine. Coronary angiogram showed distal apical LAD subtotal occlusion with thrombus without any significant stenosis in other areas. There was also slow flow in RCA. This was thought likely to be due to cocaine-induced vasospasm and non-STEMI. Patient was started on Plavix and heparin. However, she had episodes of rectal bleeding. Both Plavix and heparin were thus stopped. Gastroenterology were consulted, who recommended outpatient follow-up for possible colonoscopy once acute cardiac issues are resolved. Patient was stable on discharge with no further medical rectal bleeding. She'll be seen for outpatient follow-up in 1 week. She was also scheduled for outpatient follow-up with gastric cytology.  Family, cocaine abuse was addressed and complete abstinence was emphasized. Patient continues to maintain her story that she was given a cigarette by a friend was not aware of cocaine in it.  Consults: Gastroenterology  Significant Diagnostic Studies:  Results for Denise Knight, Denise Knight (MRN [id number]) as of 12/27/2017 12:40  Ref. Range 12/26/2017 31:54  BASIC METABOLIC PANEL Unknown Rpt  Sodium Latest Ref Range: 135 - 145 mmol/L 142  Potassium Latest Ref Range: 3.5 - 5.1 mmol/L 4.2  Chloride Latest Ref  Range: 101 - 111 mmol/L 110  CO2 Latest Ref Range: 22 - 32 mmol/L 24  Glucose Latest Ref Range: 65 - 99 mg/dL 95  BUN Latest Ref Range: 6 - 20 mg/dL 7  Creatinine Latest Ref Range: 0.44 - 1.00 mg/dL 0.94  Calcium Latest Ref Range: 8.9 - 10.3 mg/dL 9.4  Anion gap Latest Ref Range: 5 - 15  8  GFR, Est Non African American Latest Ref Range: >60 mL/min >60  GFR, Est African American Latest Ref Range: >60 mL/min >60   Results for Denise Knight, Denise Knight (MRN [id number]) as of 12/27/2017 12:40  Ref. Range 12/26/2017 06:00  WBC Latest Ref Range: 4.0 - 10.5 K/uL 7.7  RBC Latest Ref Range: 3.87 - 5.11 MIL/uL 4.08  Hemoglobin Latest Ref Range: 12.0 - 15.0 g/dL 11.6 (L)  HCT Latest Ref Range: 36.0 - 46.0 % 36.2  MCV Latest Ref Range: 78.0 - 100.0 fL 88.7  MCH Latest Ref Range: 26.0 - 34.0 pg 28.4  MCHC Latest Ref Range: 30.0 - 36.0 g/dL 32.0  RDW Latest Ref Range: 11.5 - 15.5 % 14.4  Platelets Latest Ref Range: 150 - 400 K/uL 321      Discharge Exam: Blood pressure 126/88, pulse 81, temperature 97.7 F (36.5 C), temperature source Axillary, resp. rate 16, height 5' 8"  (1.727 m), weight 77.7 kg (171 lb 3.2 oz), SpO2 99 %. Nursing noteand vitalsreviewed. Constitutional: She isoriented to person, place, and time. She appearswell-developedand well-nourished.  HENT:  Head:Normocephalicand atraumatic.  Eyes:Conjunctivaeare normal. Pupils are equal, round, and reactive to light.  Neck:Normal range of motion.Neck supple.No JVDpresent.  Cardiovascular:Normal rateand regular rhythm. No murmurheard. Respiratory:Effort normaland breath sounds normal. Norespiratory distress. She hasno wheezes. She hasno rales. She exhibitsno tenderness.  KD:TOIZ.Bowel sounds are  normal. There isno tenderness.  Musculoskeletal:Normal range of motion. She exhibits noedema.  Lymphadenopathy:  She has no cervical adenopathy.  Neurological: She isalertand oriented to person, place, and time.  Nocranial nerve deficit. Skin: Skin iswarmand dry.  Psychiatric:Anxious, tearful    Disposition: 01-Home or Self Care  Discharge Instructions    Diet - low sodium heart healthy   Complete by:  As directed    Increase activity slowly   Complete by:  As directed      Allergies as of 12/26/2017      Reactions   Aspirin Hives   Food Hives   "Regular butter"      Medication List    STOP taking these medications   meloxicam 7.5 MG tablet Commonly known as:  MOBIC   predniSONE 10 MG tablet Commonly known as:  DELTASONE     TAKE these medications   acetaminophen 500 MG tablet Commonly known as:  TYLENOL Take 1,000 mg by mouth every 4 (four) hours as needed for moderate pain or headache.   albuterol 108 (90 Base) MCG/ACT inhaler Commonly known as:  PROVENTIL HFA;VENTOLIN HFA Inhale 2 puffs into the lungs every 6 (six) hours as needed for wheezing or shortness of breath.   azithromycin 250 MG tablet Commonly known as:  ZITHROMAX Take 1 tablet (250 mg total) by mouth daily. Take first 2 tablets together, then 1 every day until finished.   benzonatate 100 MG capsule Commonly known as:  TESSALON Take 1 capsule (100 mg total) by mouth every 8 (eight) hours.   diclofenac sodium 1 % Gel Commonly known as:  VOLTAREN Apply 2 g topically 4 (four) times daily as needed (MSK pain associated with cough).   guaiFENesin-codeine 100-10 MG/5ML syrup Take 5 mLs by mouth every 4 (four) hours as needed for cough.   hydrochlorothiazide 25 MG tablet Commonly known as:  HYDRODIURIL Take 25 mg by mouth daily.   hydrocortisone 2.5 % rectal cream Commonly known as:  ANUSOL-HC Apply rectally 2 times daily   Lidocaine (Anorectal) 5 % Gel Apply a pea-sized amount to the affected area 3 times daily.   loratadine 10 MG tablet Commonly known as:  CLARITIN Take 1 tablet (10 mg total) by mouth daily.   nicotine 7 mg/24hr patch Commonly known as:  NICODERM CQ - dosed in mg/24  hr Place 1 patch (7 mg total) onto the skin daily.   nitroGLYCERIN 0.4 MG SL tablet Commonly known as:  NITROSTAT Place 1 tablet (0.4 mg total) under the tongue every 5 (five) minutes x 3 doses as needed for chest pain.   omeprazole 20 MG capsule Commonly known as:  PRILOSEC Take 20 mg by mouth daily.   oxyCODONE-acetaminophen 5-325 MG tablet Commonly known as:  PERCOCET Take 1-2 tablets by mouth every 6 (six) hours as needed.      Follow-up Information    Levin Erp, PA Follow up on 01/22/2018.   Specialty:  Gastroenterology Why:  10:45 visit with GI PA, at that visit you will have future colonoscopy arranged.  If unable to make this date and you need to reschedule or cancel, please call [phone].  bring all meds you take, prescription and over the counter, with you to visit.  Contact information: [address] [place] [address] [phone]        Nigel Mormon, MD Follow up on 01/01/2018.   Specialty:  Cardiology Why:  1:15 PM Contact information: [address] [place] [address] [phone]           Signed: Nigel Mormon 12/27/2017, 12:36 PM

## 2018-01-22 ENCOUNTER — Ambulatory Visit: Payer: Self-pay | Admitting: Physician Assistant

## 2018-02-11 ENCOUNTER — Emergency Department (HOSPITAL_COMMUNITY)
Admission: EM | Admit: 2018-02-11 | Discharge: 2018-02-12 | Disposition: A | Discharge status: Home / Self Care | Payer: Self-pay | Attending: Emergency Medicine | Admitting: Emergency Medicine

## 2018-02-11 ENCOUNTER — Encounter (HOSPITAL_COMMUNITY): Payer: Self-pay | Admitting: Emergency Medicine

## 2018-02-11 ENCOUNTER — Emergency Department (HOSPITAL_COMMUNITY): Payer: Self-pay

## 2018-02-11 DIAGNOSIS — R0789 Other chest pain: Secondary | ICD-10-CM | POA: Insufficient documentation

## 2018-02-11 DIAGNOSIS — R079 Chest pain, unspecified: Secondary | ICD-10-CM

## 2018-02-11 DIAGNOSIS — F17210 Nicotine dependence, cigarettes, uncomplicated: Secondary | ICD-10-CM | POA: Insufficient documentation

## 2018-02-11 DIAGNOSIS — Z79899 Other long term (current) drug therapy: Secondary | ICD-10-CM | POA: Insufficient documentation

## 2018-02-11 DIAGNOSIS — J45909 Unspecified asthma, uncomplicated: Secondary | ICD-10-CM | POA: Insufficient documentation

## 2018-02-11 LAB — CBC WITH DIFFERENTIAL/PLATELET
Basophils Absolute: 0 10*3/uL (ref 0.0–0.1)
Basophils Relative: 0 %
EOS ABS: 0.3 10*3/uL (ref 0.0–0.7)
EOS PCT: 3 %
HCT: 39.2 % (ref 36.0–46.0)
HEMOGLOBIN: 12.6 g/dL (ref 12.0–15.0)
LYMPHS ABS: 3.2 10*3/uL (ref 0.7–4.0)
Lymphocytes Relative: 35 %
MCH: 28.7 pg (ref 26.0–34.0)
MCHC: 32.1 g/dL (ref 30.0–36.0)
MCV: 89.3 fL (ref 78.0–100.0)
MONOS PCT: 4 %
Monocytes Absolute: 0.4 10*3/uL (ref 0.1–1.0)
NEUTROS PCT: 58 %
Neutro Abs: 5.1 10*3/uL (ref 1.7–7.7)
Platelets: 322 10*3/uL (ref 150–400)
RBC: 4.39 MIL/uL (ref 3.87–5.11)
RDW: 15.8 % — ABNORMAL HIGH (ref 11.5–15.5)
WBC: 8.9 10*3/uL (ref 4.0–10.5)

## 2018-02-11 LAB — BASIC METABOLIC PANEL
Anion gap: 9 (ref 5–15)
BUN: 8 mg/dL (ref 6–20)
CHLORIDE: 106 mmol/L (ref 101–111)
CO2: 23 mmol/L (ref 22–32)
CREATININE: 1.17 mg/dL — AB (ref 0.44–1.00)
Calcium: 9.3 mg/dL (ref 8.9–10.3)
GFR calc Af Amer: >60 mL/min (ref >60)
GFR calc non Af Amer: 52 mL/min — ABNORMAL LOW (ref >60)
Glucose, Bld: 105 mg/dL — ABNORMAL HIGH (ref 65–99)
POTASSIUM: 3.5 mmol/L (ref 3.5–5.1)
Sodium: 138 mmol/L (ref 135–145)

## 2018-02-11 LAB — I-STAT TROPONIN, ED
TROPONIN I, POC: 0.03 ng/mL (ref 0.00–0.08)
Troponin i, poc: 0.07 ng/mL (ref 0.00–0.08)

## 2018-02-11 MED ORDER — SODIUM CHLORIDE 0.9 % IV BOLUS
1000.0000 mL | Freq: Once | INTRAVENOUS | Status: AC
  Administered 2018-02-11: 1000 mL via INTRAVENOUS

## 2018-02-11 MED ORDER — NITROGLYCERIN 0.4 MG SL SUBL: 0.4000 mg | SUBLINGUAL_TABLET | SUBLINGUAL | Status: DC | PRN

## 2018-02-11 MED ORDER — AMLODIPINE BESYLATE 5 MG PO TABS: 5.0000 mg | ORAL_TABLET | Freq: Every day | ORAL | 0 refills | Status: DC

## 2018-02-11 MED ORDER — NITROGLYCERIN 0.4 MG SL SUBL: 0.4000 mg | SUBLINGUAL_TABLET | SUBLINGUAL | 1 refills | Status: DC | PRN

## 2018-02-11 MED ORDER — NITROGLYCERIN 0.4 MG SL SUBL
0.4000 mg | SUBLINGUAL_TABLET | SUBLINGUAL | Status: AC | PRN
  Administered 2018-02-11 (×3): 0.4 mg via SUBLINGUAL
  Filled 2018-02-11: qty 1

## 2018-02-11 NOTE — ED Provider Notes (Signed)
[facility] EMERGENCY DEPARTMENT Provider Note   CSN: [id number] Arrival date & time: 02/11/18  1914     History   Chief Complaint Chief Complaint  Patient presents with  . Chest Pain    HPI Denise Knight is a 52 y.o. female.  HPI  52 year old female presents with chest pain.  Started around 6:30 PM.  She states that she was on the couch laying down whenever it started.  It is center of her chest and right-sided.  Feels like a cramping sensation.  Seems to be waxing and waning.  She has not vomited since this started but denies diaphoresis or shortness of breath.  No back pain.  No leg swelling.  Here for similar complaints a couple months ago and had a cardiac cath.  She has a history of cocaine abuse and states she last used cocaine yesterday.  Given nitroglycerin by EMS with no relief.  Pain is a 10/10.  Past Medical History:  Diagnosis Date  . Anxiety   . Arthritis    "left knee" (07/05/2016)  . Asthma   . Bipolar 1 disorder ([place])   . Depression   . GERD (gastroesophageal reflux disease)   . Hypertension   . MI (mitral incompetence)   . Post traumatic stress disorder   . Schizophrenia ([place])   . Stomach ulcer     Patient Active Problem List   Diagnosis Date Noted  . Cocaine abuse ([place]) 12/27/2017  . Rectal bleeding 12/27/2017  . NSTEMI (non-ST elevated myocardial infarction) ([place]) 12/23/2017  . Acute viral bronchitis 06/14/2017  . Asthma exacerbation 06/14/2017  . Chest pain 07/05/2016    Past Surgical History:  Procedure Laterality Date  . CARDIAC CATHETERIZATION  07/05/2016  . CARDIAC CATHETERIZATION N/A 07/05/2016   Procedure: Left Heart Cath and Coronary Angiography;  Surgeon: Adrian Prows, MD;  Location: [facility];  Service: Cardiovascular;  Laterality: N/A;  . CYST EXCISION Right    "wrist"  . DILATION AND CURETTAGE OF UTERUS    . FOOT SURGERY Bilateral    "corns removed"  . LEFT HEART CATH AND CORONARY ANGIOGRAPHY N/A  12/24/2017   Procedure: LEFT HEART CATH AND CORONARY ANGIOGRAPHY;  Surgeon: Nigel Mormon, MD;  Location: [facility];  Service: Cardiovascular;  Laterality: N/A;  . TUBAL LIGATION       OB History   None      Home Medications    Prior to Admission medications   Medication Sig Start Date End Date Taking? Authorizing Provider  albuterol (PROVENTIL HFA;VENTOLIN HFA) 108 (90 BASE) MCG/ACT inhaler Inhale 2 puffs into the lungs every 6 (six) hours as needed for wheezing or shortness of breath.   Yes [provider]  hydrochlorothiazide (HYDRODIURIL) 25 MG tablet Take 25 mg by mouth daily.   Yes [provider]  loratadine (CLARITIN) 10 MG tablet Take 1 tablet (10 mg total) by mouth daily. 06/16/17  Yes Nedrud, Larena Glassman, MD  nitroGLYCERIN (NITROSTAT) 0.4 MG SL tablet Place 1 tablet (0.4 mg total) under the tongue every 5 (five) minutes x 3 doses as needed for chest pain. 12/26/17  Yes Patwardhan, Manish J, MD  omeprazole (PRILOSEC) 20 MG capsule Take 20 mg by mouth daily.    Yes [provider]  azithromycin (ZITHROMAX) 250 MG tablet Take 1 tablet (250 mg total) by mouth daily. Take first 2 tablets together, then 1 every day until finished. Patient not taking: Reported on 02/11/2018 12/19/17   Blue,  Olivia C, PA-C  benzonatate (TESSALON) 100 MG capsule Take 1 capsule (100 mg total) by mouth every 8 (eight) hours. Patient not taking: Reported on 02/11/2018 12/19/17   Camila Li C, PA-C  diclofenac sodium (VOLTAREN) 1 % GEL Apply 2 g topically 4 (four) times daily as needed (MSK pain associated with cough). Patient not taking: Reported on 10/29/2017 06/16/17   Thomasene Ripple, MD  guaiFENesin-codeine 100-10 MG/5ML syrup Take 5 mLs by mouth every 4 (four) hours as needed for cough. Patient not taking: Reported on 08/18/2017 06/16/17   Thomasene Ripple, MD  hydrocortisone (ANUSOL-HC) 2.5 % rectal cream Apply rectally 2 times daily Patient not taking: Reported on  12/23/2017 10/29/17   Ocie Cornfield T, PA-C  Lidocaine, Anorectal, 5 % GEL Apply a pea-sized amount to the affected area 3 times daily. Patient not taking: Reported on 12/23/2017 10/29/17   Ocie Cornfield T, PA-C  nicotine (NICODERM CQ - DOSED IN MG/24 HR) 7 mg/24hr patch Place 1 patch (7 mg total) onto the skin daily. Patient not taking: Reported on 08/18/2017 06/17/17   Thomasene Ripple, MD  oxyCODONE-acetaminophen (PERCOCET) 5-325 MG tablet Take 1-2 tablets by mouth every 6 (six) hours as needed. Patient not taking: Reported on 08/18/2017 07/10/17   Little, Wenda Overland, MD    Family History Family History  Problem Relation Age of Onset  . Breast cancer Unknown   . Lung cancer Unknown   . Congestive Heart Failure Unknown   . Diabetes Unknown   . Hypertension Unknown     Social History Social History   Tobacco Use  . Smoking status: Current Some Day Smoker    Packs/day: 1.00    Years: 35.00    Pack years: 35.00    Types: Cigarettes  . Smokeless tobacco: Never Used  Substance Use Topics  . Alcohol use: Yes    Alcohol/week: 4.2 oz    Types: 7 Cans of beer per week  . Drug use: Yes    Types: Cocaine    Comment: last used cocaine 2014     Allergies   Aspirin and Food   Review of Systems Review of Systems  Constitutional: Negative for diaphoresis.  Respiratory: Negative for shortness of breath.   Cardiovascular: Positive for chest pain.  Gastrointestinal: Positive for vomiting. Negative for abdominal pain.  Musculoskeletal: Negative for back pain.  All other systems reviewed and are negative.    Physical Exam Updated Vital Signs BP 140/84   Pulse 62   Temp 98.6 F (37 C) (Oral)   Resp 20   Ht 5' 9"  (1.753 m)   Wt 77.7 kg (171 lb 4.8 oz)   SpO2 95%   BMI 25.30 kg/m   Physical Exam  Constitutional: She is oriented to person, place, and time. She appears well-developed and well-nourished.  Non-toxic appearance. She does not appear ill. No distress.    HENT:  Head: Normocephalic and atraumatic.  Right Ear: External ear normal.  Left Ear: External ear normal.  Nose: Nose normal.  Eyes: Right eye exhibits no discharge. Left eye exhibits no discharge.  Cardiovascular: Normal rate, regular rhythm and normal heart sounds.  Pulses:      Radial pulses are 2+ on the right side, and 2+ on the left side.  Pulmonary/Chest: Effort normal and breath sounds normal. She exhibits tenderness (mild).    Abdominal: Soft. There is no tenderness.  Neurological: She is alert and oriented to person, place, and time.  Skin: Skin is warm and dry.  Nursing note  and vitals reviewed.    ED Treatments / Results  Labs (all labs ordered are listed, but only abnormal results are displayed) Labs Reviewed  BASIC METABOLIC PANEL - Abnormal; Notable for the following components:      Result Value   Glucose, Bld 105 (*)    Creatinine, Ser 1.17 (*)    GFR calc non Af Amer 52 (*)    All other components within normal limits  CBC WITH DIFFERENTIAL/PLATELET - Abnormal; Notable for the following components:   RDW 15.8 (*)    All other components within normal limits  I-STAT TROPONIN, ED  I-STAT TROPONIN, ED    EKG EKG Interpretation  Date/Time:  Wednesday February 11 2018 19:42:33 EDT Ventricular Rate:  81 PR Interval:    QRS Duration: 108 QT Interval:  366 QTC Calculation: 425 R Axis:   47 Text Interpretation:  Sinus rhythm Atrial premature complex Probable anterior infarct, age indeterminate no significant change since earlier in the day Confirmed by Sherwood Gambler [phone]) on 02/11/2018 8:18:07 PM   Radiology Dg Chest Portable 1 View  Result Date: 02/11/2018 CLINICAL DATA:  Chest pain EXAM: PORTABLE CHEST 1 VIEW COMPARISON:  12/23/2017 FINDINGS: The heart size and mediastinal contours are within normal limits. Both lungs are clear. Mild aortic atherosclerosis. The visualized skeletal structures are unremarkable. IMPRESSION: No active disease.  Electronically Signed   By: Donavan Foil M.D.   On: 02/11/2018 19:52    Procedures Procedures (including critical care time)  Medications Ordered in ED Medications  sodium chloride 0.9 % bolus 1,000 mL (0 mLs Intravenous Stopped 02/11/18 2101)  nitroGLYCERIN (NITROSTAT) SL tablet 0.4 mg (0.4 mg Sublingual Given 02/11/18 2016)     Initial Impression / Assessment and Plan / ED Course  I have reviewed the triage vital signs and the nursing notes.  Pertinent labs & imaging results that were available during my care of the patient were reviewed by me and considered in my medical decision making (see chart for details).     Chest pain is improved though not necessarily from nitroglycerin.  Initial troponin negative.  ECG is abnormal and was discussed with her cardiologist, Dr. Einar Gip.  He has reviewed the ECG but does not feel this is an acutely ischemic EKG or STEMI.  Advises second troponin and if negative she could be discharged to follow-up with him.  If positive, reassess and likely admit.  Patient care transferred with second troponin and repeat ECG pending. Armstead Peaks to follow up on results.  Final Clinical Impressions(s) / ED Diagnoses   Final diagnoses:  None    ED Discharge Orders    None       Sherwood Gambler, MD 02/11/18 2224

## 2018-02-11 NOTE — ED Notes (Signed)
REpaged [facility] to [employer]

## 2018-02-11 NOTE — Discharge Instructions (Signed)
Stop taking hydrochlorothiazide and begin taking amlodipine for your blood pressure.  For return of chest pain, take nitroglycerin sublingual tablets every 5 minutes for 3 doses as needed if not resolved by the first dose. Avoid using cocaine, as this could cause a heart attack and death. Please follow up with Dr. Einar Gip by calling his office tomorrow. Please return to the emergency department if you develop any new or worsening symptoms.

## 2018-02-11 NOTE — ED Triage Notes (Signed)
  Patient was sitting at home watching TV and had chest pain 10/10 in middle of chest.  Denies SOB or nausea.  Reported use of cocaine yesterday.  Hx of HTN, muscle spasms and drug use.  Drinks alcohol and smokes a pack of cigarettes a day.

## 2018-02-11 NOTE — ED Notes (Signed)
Pt request blood draw from IV,  I will notify nurse.

## 2018-02-12 MED ORDER — MORPHINE SULFATE (PF) 4 MG/ML IV SOLN
4.0000 mg | Freq: Once | INTRAVENOUS | Status: AC
  Administered 2018-02-12: 4 mg via INTRAVENOUS
  Filled 2018-02-12: qty 1

## 2018-02-12 NOTE — ED Provider Notes (Signed)
Signout from Dr. Regenia Skeeter at shift change See previous providers note for full H&P  Briefly, patient reports having chest pain which started around 6:30 PM.  She was lying on the couch when it started.  It is central and right-sided and feels like a cramping sensation.  It is waxing and waning.  Patient used cocaine yesterday.  Patient follows with Dr. Einar Gip for cardiology.  Dr. Einar Gip was consulted and advised delta troponin and repeat EKG.  Patient reports she had relief earlier with 4 nitroglycerin tablets, however her chest pain has returned.  Repeat troponin increased 0.07, however is still technically negative.  I consulted Dr. Einar Gip again who advised it was safe for the patient to be discharged with close follow-up, however to change the HCTZ to amlodipine 5 mg.  He also advised discharging home with 25 tablets of nitroglycerin sublingual.  Patient will be given morphine prior to discharge, as she is still reporting pain.  Patient advised to avoid cocaine.  She is advised to call Dr. Irven Shelling office tomorrow for follow-up.  Return precautions given.  Patient understands and agrees with plan.  Patient vitals stable and discharged in satisfactory condition.   Frederica Kuster, PA-C 02/12/18 8416    Sherwood Gambler, MD 02/12/18 2251

## 2018-05-26 ENCOUNTER — Other Ambulatory Visit: Payer: Self-pay

## 2018-05-26 ENCOUNTER — Encounter (HOSPITAL_COMMUNITY): Payer: Self-pay

## 2018-05-26 ENCOUNTER — Emergency Department (HOSPITAL_COMMUNITY)
Admission: EM | Admit: 2018-05-26 | Discharge: 2018-05-26 | Disposition: A | Discharge status: Home / Self Care | Payer: Self-pay | Attending: Emergency Medicine | Admitting: Emergency Medicine

## 2018-05-26 ENCOUNTER — Emergency Department (HOSPITAL_COMMUNITY): Payer: Self-pay

## 2018-05-26 DIAGNOSIS — R55 Syncope and collapse: Secondary | ICD-10-CM | POA: Insufficient documentation

## 2018-05-26 DIAGNOSIS — Z5321 Procedure and treatment not carried out due to patient leaving prior to being seen by health care provider: Secondary | ICD-10-CM | POA: Insufficient documentation

## 2018-05-26 LAB — CBC
HCT: 41.2 % (ref 36.0–46.0)
Hemoglobin: 12.9 g/dL (ref 12.0–15.0)
MCH: 27.5 pg (ref 26.0–34.0)
MCHC: 31.3 g/dL (ref 30.0–36.0)
MCV: 87.8 fL (ref 78.0–100.0)
PLATELETS: 285 10*3/uL (ref 150–400)
RBC: 4.69 MIL/uL (ref 3.87–5.11)
RDW: 16.5 % — AB (ref 11.5–15.5)
WBC: 7.4 10*3/uL (ref 4.0–10.5)

## 2018-05-26 LAB — BASIC METABOLIC PANEL
Anion gap: 7 (ref 5–15)
BUN: 13 mg/dL (ref 6–20)
CALCIUM: 9.2 mg/dL (ref 8.9–10.3)
CO2: 25 mmol/L (ref 22–32)
CREATININE: 1.17 mg/dL — AB (ref 0.44–1.00)
Chloride: 111 mmol/L (ref 98–111)
GFR calc Af Amer: >60 mL/min (ref >60)
GFR, EST NON AFRICAN AMERICAN: 52 mL/min — AB (ref >60)
GLUCOSE: 91 mg/dL (ref 70–99)
Potassium: 3.8 mmol/L (ref 3.5–5.1)
Sodium: 143 mmol/L (ref 135–145)

## 2018-05-26 LAB — I-STAT BETA HCG BLOOD, ED (MC, WL, AP ONLY): I-stat hCG, quantitative: <5 m[IU]/mL (ref <5)

## 2018-05-26 LAB — I-STAT TROPONIN, ED: TROPONIN I, POC: 0 ng/mL (ref 0.00–0.08)

## 2018-05-26 NOTE — ED Notes (Signed)
Pt stated that she urgently had to leave to pick up her grandson. Pt left waiting room at 1:17.

## 2018-05-26 NOTE — ED Notes (Signed)
During vitals re-check, Pt stated that her CP dissipated after taking 2 Nitros. Stated she took one prior to arriving and one in triage.

## 2018-05-26 NOTE — ED Triage Notes (Signed)
Pt states she had syncopal episode this morning as well as 2 days ago. She states she woke up around 0900 with chest pain. She also reports BRB per rectum for about a month. AOX4 in triage.

## 2018-05-29 NOTE — ED Notes (Signed)
Follow up call made  No answer  05/29/18  1324  s Steffi Noviello rn

## 2018-06-01 ENCOUNTER — Emergency Department (HOSPITAL_COMMUNITY): Payer: Self-pay

## 2018-06-01 ENCOUNTER — Emergency Department (HOSPITAL_COMMUNITY)
Admission: EM | Admit: 2018-06-01 | Discharge: 2018-06-01 | Disposition: A | Discharge status: Home / Self Care | Payer: Self-pay | Attending: Emergency Medicine | Admitting: Emergency Medicine

## 2018-06-01 ENCOUNTER — Encounter (HOSPITAL_COMMUNITY): Payer: Self-pay | Admitting: Emergency Medicine

## 2018-06-01 DIAGNOSIS — K625 Hemorrhage of anus and rectum: Secondary | ICD-10-CM

## 2018-06-01 DIAGNOSIS — K640 First degree hemorrhoids: Secondary | ICD-10-CM | POA: Insufficient documentation

## 2018-06-01 DIAGNOSIS — K253 Acute gastric ulcer without hemorrhage or perforation: Secondary | ICD-10-CM | POA: Insufficient documentation

## 2018-06-01 DIAGNOSIS — F17210 Nicotine dependence, cigarettes, uncomplicated: Secondary | ICD-10-CM | POA: Insufficient documentation

## 2018-06-01 DIAGNOSIS — I10 Essential (primary) hypertension: Secondary | ICD-10-CM | POA: Insufficient documentation

## 2018-06-01 DIAGNOSIS — R0602 Shortness of breath: Secondary | ICD-10-CM | POA: Insufficient documentation

## 2018-06-01 DIAGNOSIS — Z79899 Other long term (current) drug therapy: Secondary | ICD-10-CM | POA: Insufficient documentation

## 2018-06-01 DIAGNOSIS — I252 Old myocardial infarction: Secondary | ICD-10-CM | POA: Insufficient documentation

## 2018-06-01 LAB — CBC
HCT: 40.5 % (ref 36.0–46.0)
Hemoglobin: 12.7 g/dL (ref 12.0–15.0)
MCH: 27.5 pg (ref 26.0–34.0)
MCHC: 31.4 g/dL (ref 30.0–36.0)
MCV: 87.9 fL (ref 78.0–100.0)
PLATELETS: 296 10*3/uL (ref 150–400)
RBC: 4.61 MIL/uL (ref 3.87–5.11)
RDW: 16.6 % — ABNORMAL HIGH (ref 11.5–15.5)
WBC: 7.4 10*3/uL (ref 4.0–10.5)

## 2018-06-01 LAB — COMPREHENSIVE METABOLIC PANEL
ALBUMIN: 3.3 g/dL — AB (ref 3.5–5.0)
ALT: 16 U/L (ref 0–44)
AST: 27 U/L (ref 15–41)
Alkaline Phosphatase: 59 U/L (ref 38–126)
Anion gap: 9 (ref 5–15)
BUN: 10 mg/dL (ref 6–20)
CALCIUM: 9.2 mg/dL (ref 8.9–10.3)
CO2: 21 mmol/L — AB (ref 22–32)
CREATININE: 1.12 mg/dL — AB (ref 0.44–1.00)
Chloride: 111 mmol/L (ref 98–111)
GFR calc Af Amer: >60 mL/min (ref >60)
GFR calc non Af Amer: 55 mL/min — ABNORMAL LOW (ref >60)
GLUCOSE: 108 mg/dL — AB (ref 70–99)
Potassium: 3.7 mmol/L (ref 3.5–5.1)
SODIUM: 141 mmol/L (ref 135–145)
Total Bilirubin: 0.3 mg/dL (ref 0.3–1.2)
Total Protein: 6.8 g/dL (ref 6.5–8.1)

## 2018-06-01 LAB — TYPE AND SCREEN
ABO/RH(D): O NEG
Antibody Screen: NEGATIVE

## 2018-06-01 IMAGING — CT CT ABD-PELV W/ CM
2 of 5 series · 16 of 46 positions shown, 18 images · IV contrast (APPLIED)
Comparison: CT abdomen and pelvis [DATE].

CLINICAL DATA: Shortness of breath. Rectal bleeding for 1 month.
History of substance abuse and hemorrhoids.

EXAM:
CT ABDOMEN AND PELVIS WITH CONTRAST
TECHNIQUE: Multidetector CT imaging of the abdomen and pelvis was performed
using the standard protocol following bolus administration of
intravenous contrast.
CONTRAST:  100mL OMNIPAQUE IOHEXOL 300 MG/ML  SOLN

[Series 3: abdomen 5.0 · axial · 0.79mm/px · z∈[+715,+1155]mm · 13 of 100 slices shown, 15 images]
[im 6/100  soft-tissue]
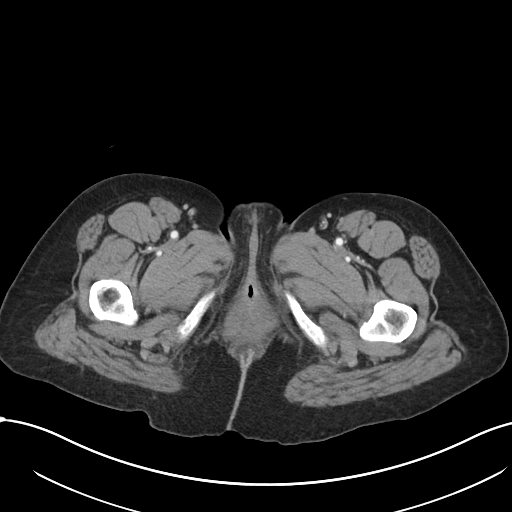
[im 6/100  bone]
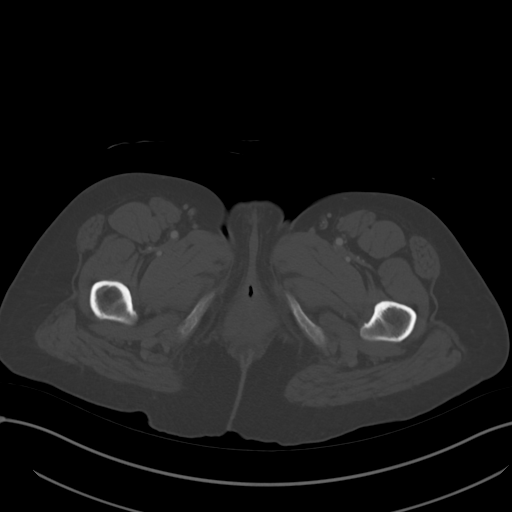
[im 12/100  soft-tissue]
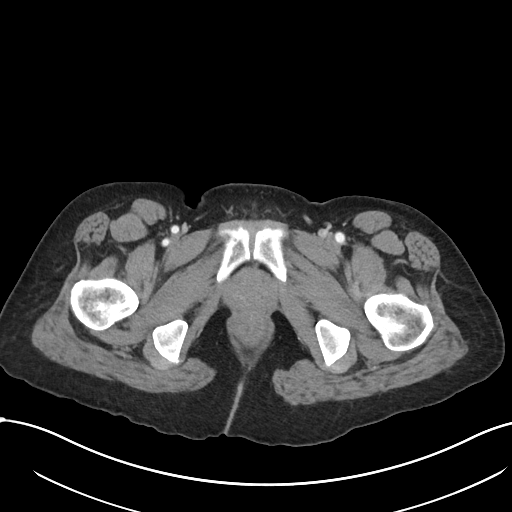
[im 24/100  soft-tissue]
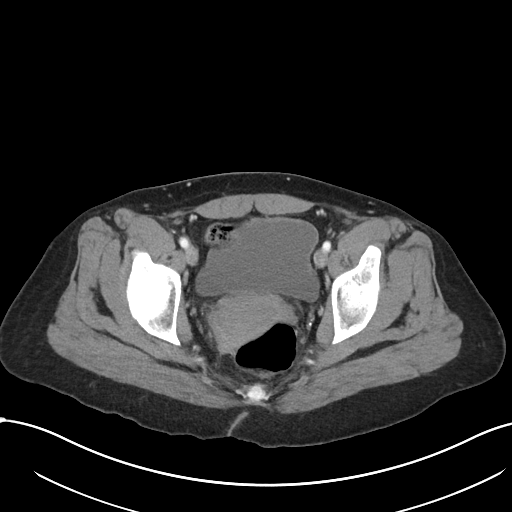
[im 30/100  soft-tissue]
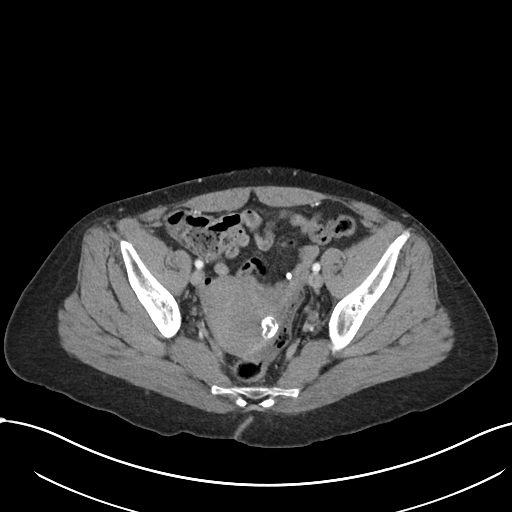
[im 35/100  soft-tissue]
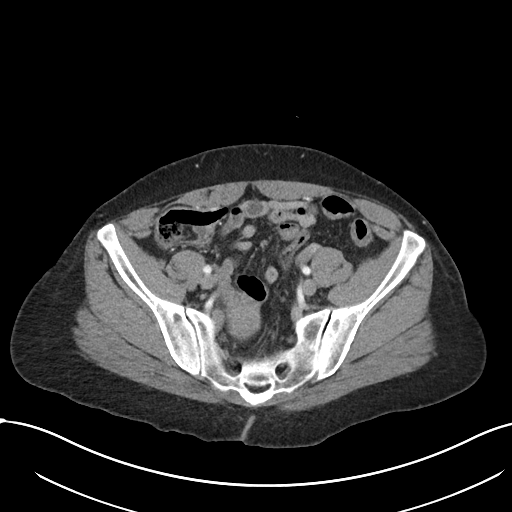
[im 41/100  soft-tissue]
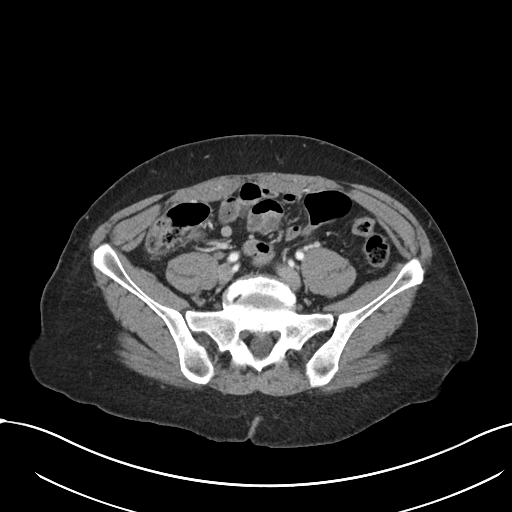
[im 53/100  soft-tissue]
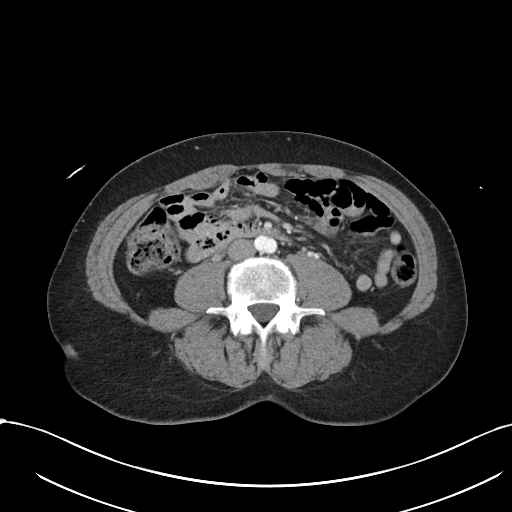
[im 59/100  soft-tissue]
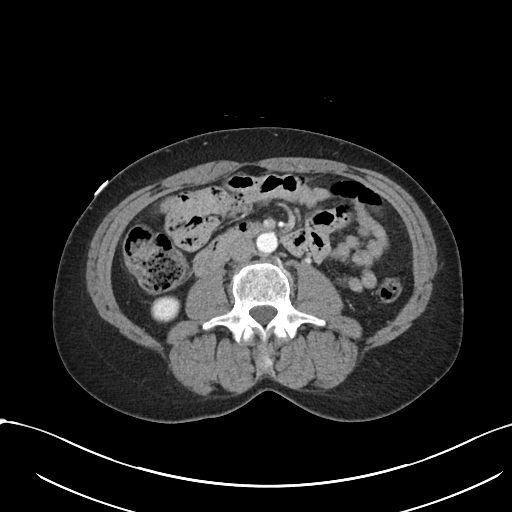
[im 65/100  soft-tissue]
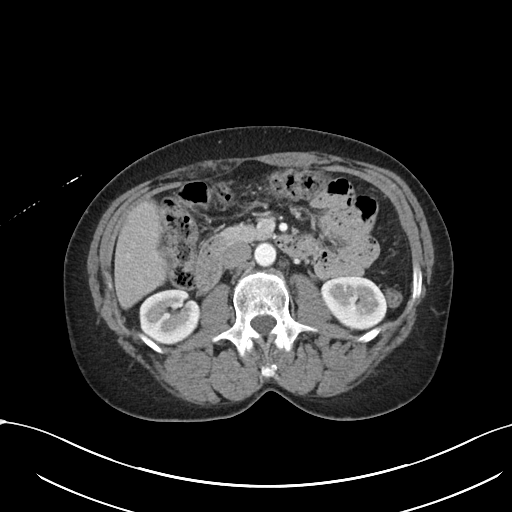
[im 65/100  bone]
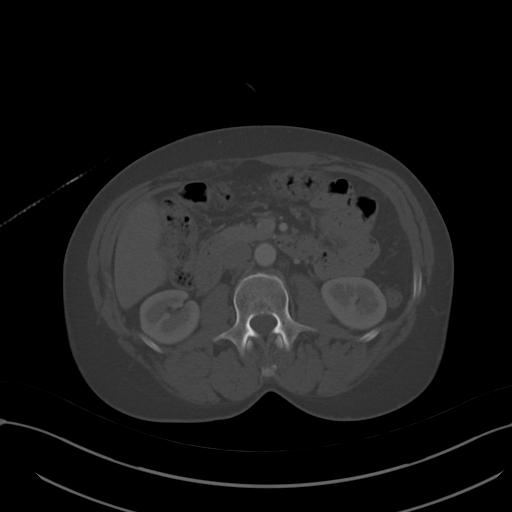
[im 70/100  soft-tissue]
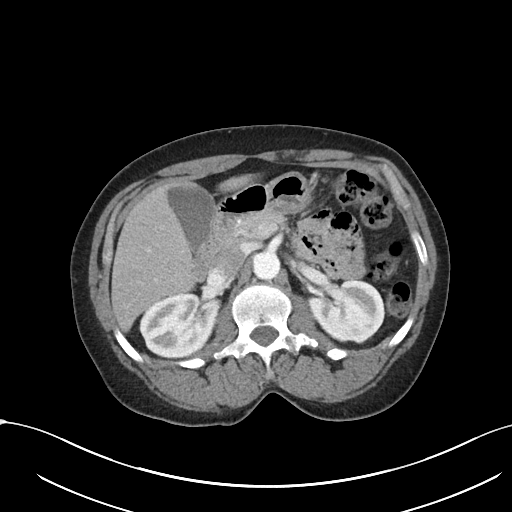
[im 76/100  soft-tissue]
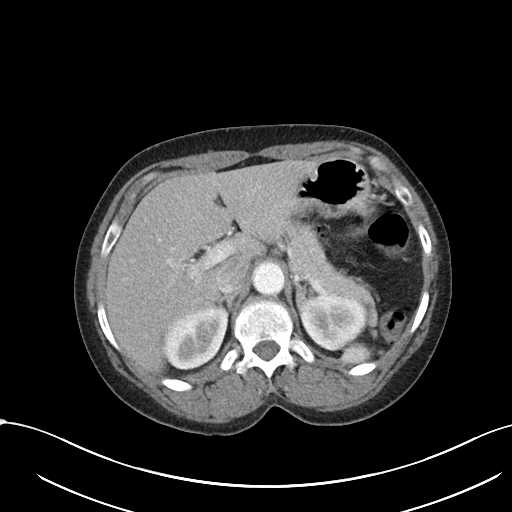
[im 88/100  soft-tissue]
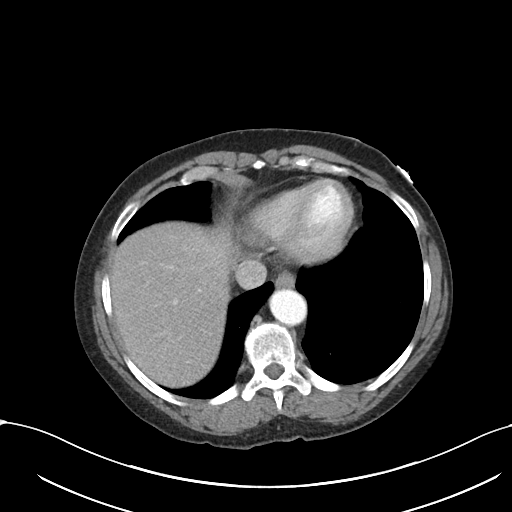
[im 94/100  soft-tissue]
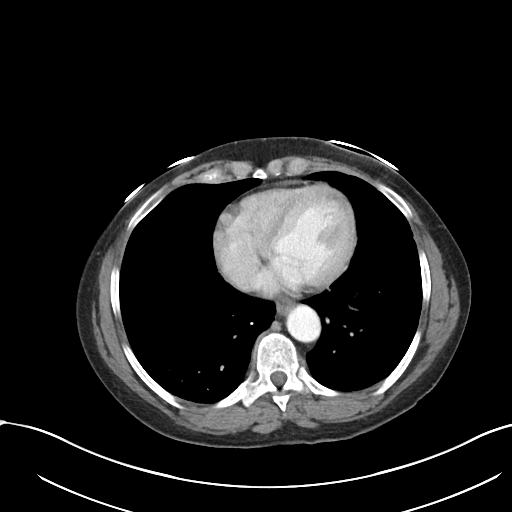

[Series 6: abdomen 3.0 mpr cor · coronal · 0.69mm/px · 3 of 93 slices shown]
[im 31/93  soft-tissue]
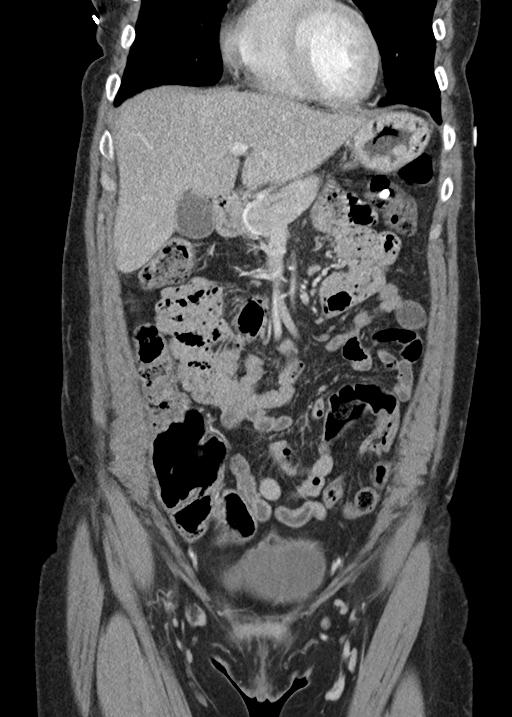
[im 41/93  soft-tissue]
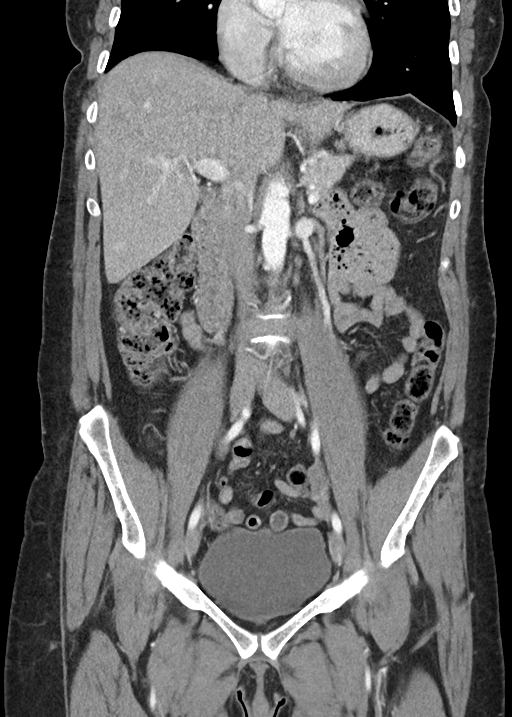
[im 52/93  soft-tissue]
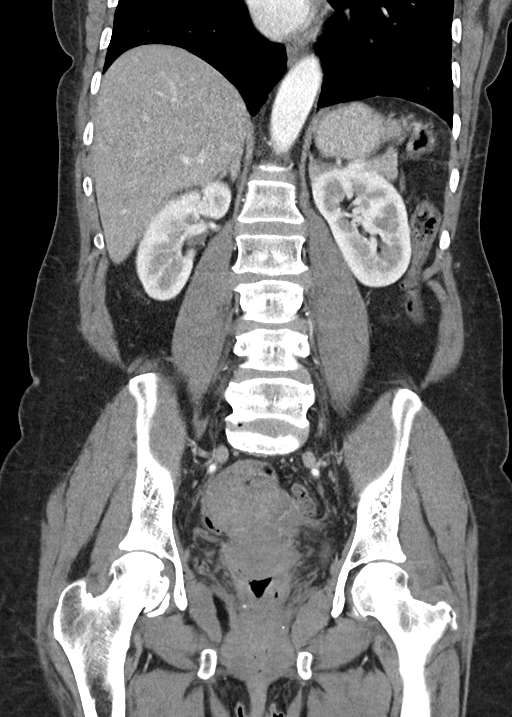

[16 of 46 positions shown; findings below may reference images not displayed]

FINDINGS: LOWER CHEST: Lung bases are clear. Included heart size is normal. No
pericardial effusion.

HEPATOBILIARY: Liver and gallbladder are normal.

PANCREAS: Normal.

SPLEEN: Normal.

ADRENALS/URINARY TRACT: Kidneys are orthotopic, demonstrating
symmetric enhancement. No nephrolithiasis, hydronephrosis or solid
renal masses. Mild bilateral focal cortical scarring. The
unopacified ureters are normal in course and caliber. Delayed
imaging through the kidneys demonstrates symmetric prompt contrast
excretion within the proximal urinary collecting system. Urinary
bladder is partially distended and unremarkable. Normal adrenal
glands.

STOMACH/BOWEL: The stomach, small and large bowel are normal in
course and caliber without inflammatory changes. Subcentimeter ovoid
enteric densities most compatible with pills. Normal appendix.

VASCULAR/LYMPHATIC: Aortoiliac vessels are normal in course and
caliber. No lymphadenopathy by CT size criteria.

REPRODUCTIVE: Nonacute. Small calcified and noncalcified uterine
leiomyomas.

OTHER: No intraperitoneal free fluid or free air.

MUSCULOSKELETAL: Nonacute. Small fat containing umbilical hernia.
Moderate degenerative change of the RIGHT hip.
IMPRESSION: 1. No acute intra-abdominal or pelvic process.
2. Uterine leiomyomas.

## 2018-06-01 MED ORDER — ALBUTEROL SULFATE HFA 108 (90 BASE) MCG/ACT IN AERS
2.0000 | INHALATION_SPRAY | Freq: Once | RESPIRATORY_TRACT | Status: AC
  Administered 2018-06-01: 2 via RESPIRATORY_TRACT
  Filled 2018-06-01: qty 6.7

## 2018-06-01 MED ORDER — HYDROCORTISONE ACETATE 25 MG RE SUPP: 25.0000 mg | Freq: Two times a day (BID) | RECTAL | 0 refills | Status: AC

## 2018-06-01 MED ORDER — ONDANSETRON HCL 4 MG/2ML IJ SOLN
4.0000 mg | Freq: Once | INTRAMUSCULAR | Status: AC
  Administered 2018-06-01: 4 mg via INTRAVENOUS
  Filled 2018-06-01: qty 2

## 2018-06-01 MED ORDER — DOCUSATE SODIUM 250 MG PO CAPS: 250.0000 mg | ORAL_CAPSULE | Freq: Every day | ORAL | 0 refills | Status: DC

## 2018-06-01 MED ORDER — IOHEXOL 300 MG/ML  SOLN
100.0000 mL | Freq: Once | INTRAMUSCULAR | Status: AC | PRN
  Administered 2018-06-01: 100 mL via INTRAVENOUS

## 2018-06-01 MED ORDER — MORPHINE SULFATE (PF) 4 MG/ML IV SOLN
4.0000 mg | Freq: Once | INTRAVENOUS | Status: AC
  Administered 2018-06-01: 4 mg via INTRAVENOUS
  Filled 2018-06-01: qty 1

## 2018-06-01 MED ORDER — IPRATROPIUM-ALBUTEROL 0.5-2.5 (3) MG/3ML IN SOLN
3.0000 mL | Freq: Once | RESPIRATORY_TRACT | Status: AC
  Administered 2018-06-01: 3 mL via RESPIRATORY_TRACT
  Filled 2018-06-01: qty 3

## 2018-06-01 MED ORDER — SODIUM CHLORIDE 0.9 % IV BOLUS
1000.0000 mL | Freq: Once | INTRAVENOUS | Status: AC
  Administered 2018-06-01: 1000 mL via INTRAVENOUS

## 2018-06-01 NOTE — Discharge Instructions (Signed)
As we discussed, it is very important that you follow-up with your doctor in the next week.  I would recommend a referral to a gastroenterologist.  I suspect your symptoms are due to hemorrhoids.  I have prescribed you a suppository, and you should take the stool softener.  There is over-the-counter pain lotions that you can purchase as well, such as lidocaine.  This should be generic and provided at your pharmacy.  It is important to follow-up with a gastroenterologist to rule out other issues with your colon, such as infection or mass or cancer.  If symptoms worsen, return to the ER.

## 2018-06-01 NOTE — ED Provider Notes (Signed)
[facility] EMERGENCY DEPARTMENT Provider Note   CSN: [id number] Arrival date & time: 06/01/18  1049     History   Chief Complaint Chief Complaint  Patient presents with  . Shortness of Breath  . Rectal Bleeding    HPI Denise Knight is a 52 y.o. female.  HPI   52 year old female with history of bipolar disorder here with multiple complaints.  Patient's primary complaint is intermittent blood in her stool.  She states that over the last several days, she has noticed initially streaks and now grossly bloody stool.  She feels like she has a bulging sensation, and sharp, stabbing pain in her rectal area that is worse when she sits down.  She has not had any significant abdominal pain, primarily only rectal pain.  Denies any fevers or chills.  Denies any anal intercourse denies any chest pain or lightheadedness.  Regarding shortness of breath, she states she often gets asthma and the warm weather.  She ran out of her inhaler.  She subsequent feels mildly short of breath, though this is improving that today.  No cough or sputum production.  Past Medical History:  Diagnosis Date  . Anxiety   . Arthritis    "left knee" (07/05/2016)  . Asthma   . Bipolar 1 disorder ([place])   . Depression   . GERD (gastroesophageal reflux disease)   . Hypertension   . MI (mitral incompetence)   . Post traumatic stress disorder   . Schizophrenia ([place])   . Stomach ulcer     Patient Active Problem List   Diagnosis Date Noted  . Cocaine abuse ([place]) 12/27/2017  . Rectal bleeding 12/27/2017  . NSTEMI (non-ST elevated myocardial infarction) ([place]) 12/23/2017  . Acute viral bronchitis 06/14/2017  . Asthma exacerbation 06/14/2017  . Chest pain 07/05/2016    Past Surgical History:  Procedure Laterality Date  . CARDIAC CATHETERIZATION  07/05/2016  . CARDIAC CATHETERIZATION N/A 07/05/2016   Procedure: Left Heart Cath and Coronary Angiography;  Surgeon: Adrian Prows, MD;  Location: [facility];  Service: Cardiovascular;  Laterality: N/A;  . CYST EXCISION Right    "wrist"  . DILATION AND CURETTAGE OF UTERUS    . FOOT SURGERY Bilateral    "corns removed"  . LEFT HEART CATH AND CORONARY ANGIOGRAPHY N/A 12/24/2017   Procedure: LEFT HEART CATH AND CORONARY ANGIOGRAPHY;  Surgeon: Nigel Mormon, MD;  Location: [facility];  Service: Cardiovascular;  Laterality: N/A;  . TUBAL LIGATION       OB History   None      Home Medications    Prior to Admission medications   Medication Sig Start Date End Date Taking? Authorizing Provider  albuterol (PROVENTIL HFA;VENTOLIN HFA) 108 (90 BASE) MCG/ACT inhaler Inhale 2 puffs into the lungs every 6 (six) hours as needed for wheezing or shortness of breath.   Yes [provider]  loratadine (CLARITIN) 10 MG tablet Take 1 tablet (10 mg total) by mouth daily. 06/16/17  Yes Nedrud, Larena Glassman, MD  nitroGLYCERIN (NITROSTAT) 0.4 MG SL tablet Place 1 tablet (0.4 mg total) under the tongue every 5 (five) minutes x 3 doses as needed for chest pain. 02/11/18  Yes Law, Bea Graff, PA-C  omeprazole (PRILOSEC) 20 MG capsule Take 20 mg by mouth daily.    Yes [provider]  amLODipine (NORVASC) 5 MG tablet Take 1 tablet (5 mg total) by mouth daily. Patient not taking: Reported on 06/01/2018 02/11/18   Eliezer Mccoy  M, PA-C  diclofenac sodium (VOLTAREN) 1 % GEL Apply 2 g topically 4 (four) times daily as needed (MSK pain associated with cough). Patient not taking: Reported on 10/29/2017 06/16/17   Thomasene Ripple, MD  docusate sodium (COLACE) 250 MG capsule Take 1 capsule (250 mg total) by mouth daily. 06/01/18   Duffy [facility], MD  guaiFENesin-codeine 100-10 MG/5ML syrup Take 5 mLs by mouth every 4 (four) hours as needed for cough. Patient not taking: Reported on 08/18/2017 06/16/17   Thomasene Ripple, MD  hydrocortisone (ANUSOL-HC) 2.5 % rectal cream Apply rectally 2 times daily Patient not taking: Reported on 12/23/2017  10/29/17   Doristine Devoid, PA-C  hydrocortisone (ANUSOL-HC) 25 MG suppository Place 1 suppository (25 mg total) rectally 2 (two) times daily for 5 days. 06/01/18 06/06/18  Duffy [facility], MD  Lidocaine, Anorectal, 5 % GEL Apply a pea-sized amount to the affected area 3 times daily. Patient not taking: Reported on 12/23/2017 10/29/17   Ocie Cornfield T, PA-C  nicotine (NICODERM CQ - DOSED IN MG/24 HR) 7 mg/24hr patch Place 1 patch (7 mg total) onto the skin daily. Patient not taking: Reported on 08/18/2017 06/17/17   Thomasene Ripple, MD  oxyCODONE-acetaminophen (PERCOCET) 5-325 MG tablet Take 1-2 tablets by mouth every 6 (six) hours as needed. Patient not taking: Reported on 08/18/2017 07/10/17   Little, Wenda Overland, MD    Family History Family History  Problem Relation Age of Onset  . Breast cancer Unknown   . Lung cancer Unknown   . Congestive Heart Failure Unknown   . Diabetes Unknown   . Hypertension Unknown     Social History Social History   Tobacco Use  . Smoking status: Current Some Day Smoker    Packs/day: 1.00    Years: 35.00    Pack years: 35.00    Types: Cigarettes  . Smokeless tobacco: Never Used  Substance Use Topics  . Alcohol use: Yes    Alcohol/week: 4.2 oz    Types: 7 Cans of beer per week  . Drug use: Yes    Types: Cocaine    Comment: last used cocaine 2014     Allergies   Aspirin and Food   Review of Systems Review of Systems  Constitutional: Negative for chills, fatigue and fever.  HENT: Negative for congestion and rhinorrhea.   Eyes: Negative for visual disturbance.  Respiratory: Positive for shortness of breath. Negative for cough and wheezing.   Cardiovascular: Negative for chest pain and leg swelling.  Gastrointestinal: Positive for blood in stool and rectal pain. Negative for abdominal pain, diarrhea, nausea and vomiting.  Genitourinary: Negative for dysuria and flank pain.  Musculoskeletal: Negative for neck pain and neck stiffness.    Skin: Negative for rash and wound.  Allergic/Immunologic: Negative for immunocompromised state.  Neurological: Negative for syncope, weakness and headaches.  All other systems reviewed and are negative.    Physical Exam Updated Vital Signs BP 139/86   Pulse 78   Temp 98.2 F (36.8 C) (Oral)   Resp 18   Ht 5' 8.5" (1.74 m)   Wt 62.1 kg (137 lb)   SpO2 97%   BMI 20.53 kg/m   Physical Exam  Constitutional: She is oriented to person, place, and time. She appears well-developed and well-nourished. No distress.  HENT:  Head: Normocephalic and atraumatic.  Eyes: Conjunctivae are normal.  Neck: Neck supple.  Cardiovascular: Normal rate, regular rhythm and normal heart sounds. Exam reveals no friction rub.  No murmur heard. Pulmonary/Chest: Effort  normal. No respiratory distress. She has wheezes (scant, end-expiratory only). She has no rales.  Abdominal: Soft. She exhibits no distension. There is no tenderness. There is no rebound and no guarding.  Genitourinary:  Genitourinary Comments: Multiple mildly swollen, painful external hemorrhoids. Non-prolapsed internal hemorrhoids. Small amount of blood clot noted on external hemorrhoids. No active bleeding.  Musculoskeletal: She exhibits no edema.  Neurological: She is alert and oriented to person, place, and time. She exhibits normal muscle tone.  Skin: Skin is warm. Capillary refill takes less than 2 seconds.  Psychiatric: She has a normal mood and affect.  Nursing note and vitals reviewed.    ED Treatments / Results  Labs (all labs ordered are listed, but only abnormal results are displayed) Labs Reviewed  COMPREHENSIVE METABOLIC PANEL - Abnormal; Notable for the following components:      Result Value   CO2 21 (*)    Glucose, Bld 108 (*)    Creatinine, Ser 1.12 (*)    Albumin 3.3 (*)    GFR calc non Af Amer 55 (*)    All other components within normal limits  CBC - Abnormal; Notable for the following components:   RDW  16.6 (*)    All other components within normal limits  TYPE AND SCREEN    EKG EKG Interpretation  Date/Time:  Monday June 01 2018 10:56:17 EDT Ventricular Rate:  86 PR Interval:  154 QRS Duration: 70 QT Interval:  366 QTC Calculation: 437 R Axis:   48 Text Interpretation:  Normal sinus rhythm Nonspecific T wave abnormality Abnormal ECG No significant change since last tracing Confirmed by Duffy [facility] [phone]) on 06/01/2018 12:18:02 PM   Radiology Ct Abdomen Pelvis W Contrast  Result Date: 06/01/2018 CLINICAL DATA:  Shortness of breath. Rectal bleeding for 1 month. History of substance abuse and hemorrhoids. EXAM: CT ABDOMEN AND PELVIS WITH CONTRAST TECHNIQUE: Multidetector CT imaging of the abdomen and pelvis was performed using the standard protocol following bolus administration of intravenous contrast. CONTRAST:  144m OMNIPAQUE IOHEXOL 300 MG/ML  SOLN COMPARISON:  CT abdomen and pelvis October 29, 2017. FINDINGS: LOWER CHEST: Lung bases are clear. Included heart size is normal. No pericardial effusion. HEPATOBILIARY: Liver and gallbladder are normal. PANCREAS: Normal. SPLEEN: Normal. ADRENALS/URINARY TRACT: Kidneys are orthotopic, demonstrating symmetric enhancement. No nephrolithiasis, hydronephrosis or solid renal masses. Mild bilateral focal cortical scarring. The unopacified ureters are normal in course and caliber. Delayed imaging through the kidneys demonstrates symmetric prompt contrast excretion within the proximal urinary collecting system. Urinary bladder is partially distended and unremarkable. Normal adrenal glands. STOMACH/BOWEL: The stomach, small and large bowel are normal in course and caliber without inflammatory changes. Subcentimeter ovoid enteric densities most compatible with pills. Normal appendix. VASCULAR/LYMPHATIC: Aortoiliac vessels are normal in course and caliber. No lymphadenopathy by CT size criteria. REPRODUCTIVE: Nonacute. Small calcified and noncalcified  uterine leiomyomas. OTHER: No intraperitoneal free fluid or free air. MUSCULOSKELETAL: Nonacute. Small fat containing umbilical hernia. Moderate degenerative change of the RIGHT hip. IMPRESSION: 1. No acute intra-abdominal or pelvic process. 2. Uterine leiomyomas. Electronically Signed   By: CElon AlasM.D.   On: 06/01/2018 14:14    Procedures Procedures (including critical care time)  Medications Ordered in ED Medications  ipratropium-albuterol (DUONEB) 0.5-2.5 (3) MG/3ML nebulizer solution 3 mL (3 mLs Nebulization Given 06/01/18 1109)  sodium chloride 0.9 % bolus 1,000 mL (0 mLs Intravenous Stopped 06/01/18 1538)  morphine 4 MG/ML injection 4 mg (4 mg Intravenous Given 06/01/18 1420)  ondansetron (ZOFRAN) injection 4  mg (4 mg Intravenous Given 06/01/18 1420)  albuterol (PROVENTIL HFA;VENTOLIN HFA) 108 (90 Base) MCG/ACT inhaler 2 puff (2 puffs Inhalation Given 06/01/18 1359)  iohexol (OMNIPAQUE) 300 MG/ML solution 100 mL (100 mLs Intravenous Contrast Given 06/01/18 1334)     Initial Impression / Assessment and Plan / ED Course  I have reviewed the triage vital signs and the nursing notes.  Pertinent labs & imaging results that were available during my care of the patient were reviewed by me and considered in my medical decision making (see chart for details).     52 year old female with past medical history as above here with rectal bleeding and shortness of breath.  Regarding her rectal bleeding, I suspect this is secondary to hemorrhoids, acutely inflamed.  She has significant tender external hemorrhoids on exam.  No significant evidence of thrombosis however.  CT scan negative for other abnormalities.  No vaginal bleeding.  Regarding her shortness of breath, she had a mild wheeze that resolved with a single dose of albuterol.  Normal rhythm.  No hypoxia.  Will give her an inhaler.  Suspect this is weather related asthma, which is known issue for her.  Final Clinical Impressions(s) / ED  Diagnoses   Final diagnoses:  Grade I hemorrhoids  Rectal bleeding    ED Discharge Orders        Ordered    hydrocortisone (ANUSOL-HC) 25 MG suppository  2 times daily     06/01/18 1552    docusate sodium (COLACE) 250 MG capsule  Daily     06/01/18 1552       Duffy [facility], MD 06/01/18 2022

## 2018-06-01 NOTE — ED Notes (Addendum)
Pt unable to stand for 3 minutes during orthostatics. Pt complained of rectal pain and dizziness while standing.

## 2018-06-01 NOTE — ED Triage Notes (Signed)
Pt. Stated, I started having SOB this morning cause I have asthma and inhaler not working. Ive also had rectal bleeding for a month.

## 2018-06-08 ENCOUNTER — Observation Stay (HOSPITAL_COMMUNITY)
Admission: EM | Admit: 2018-06-08 | Discharge: 2018-06-10 | Disposition: A | Discharge status: Home / Self Care | Payer: Self-pay | Attending: Internal Medicine | Admitting: Internal Medicine

## 2018-06-08 ENCOUNTER — Emergency Department (HOSPITAL_COMMUNITY): Payer: Self-pay

## 2018-06-08 DIAGNOSIS — M1712 Unilateral primary osteoarthritis, left knee: Secondary | ICD-10-CM | POA: Insufficient documentation

## 2018-06-08 DIAGNOSIS — F1410 Cocaine abuse, uncomplicated: Secondary | ICD-10-CM | POA: Diagnosis present

## 2018-06-08 DIAGNOSIS — Z7902 Long term (current) use of antithrombotics/antiplatelets: Secondary | ICD-10-CM | POA: Insufficient documentation

## 2018-06-08 DIAGNOSIS — J45909 Unspecified asthma, uncomplicated: Secondary | ICD-10-CM | POA: Insufficient documentation

## 2018-06-08 DIAGNOSIS — A599 Trichomoniasis, unspecified: Secondary | ICD-10-CM | POA: Insufficient documentation

## 2018-06-08 DIAGNOSIS — F319 Bipolar disorder, unspecified: Secondary | ICD-10-CM | POA: Insufficient documentation

## 2018-06-08 DIAGNOSIS — F4310 Post-traumatic stress disorder, unspecified: Secondary | ICD-10-CM | POA: Insufficient documentation

## 2018-06-08 DIAGNOSIS — Z79899 Other long term (current) drug therapy: Secondary | ICD-10-CM | POA: Insufficient documentation

## 2018-06-08 DIAGNOSIS — I10 Essential (primary) hypertension: Secondary | ICD-10-CM | POA: Insufficient documentation

## 2018-06-08 DIAGNOSIS — N179 Acute kidney failure, unspecified: Secondary | ICD-10-CM | POA: Insufficient documentation

## 2018-06-08 DIAGNOSIS — R079 Chest pain, unspecified: Principal | ICD-10-CM | POA: Diagnosis present

## 2018-06-08 DIAGNOSIS — K219 Gastro-esophageal reflux disease without esophagitis: Secondary | ICD-10-CM | POA: Insufficient documentation

## 2018-06-08 DIAGNOSIS — F209 Schizophrenia, unspecified: Secondary | ICD-10-CM | POA: Insufficient documentation

## 2018-06-08 DIAGNOSIS — E876 Hypokalemia: Secondary | ICD-10-CM | POA: Insufficient documentation

## 2018-06-08 DIAGNOSIS — Z886 Allergy status to analgesic agent status: Secondary | ICD-10-CM | POA: Insufficient documentation

## 2018-06-08 DIAGNOSIS — I252 Old myocardial infarction: Secondary | ICD-10-CM | POA: Insufficient documentation

## 2018-06-08 DIAGNOSIS — I340 Nonrheumatic mitral (valve) insufficiency: Secondary | ICD-10-CM | POA: Insufficient documentation

## 2018-06-08 LAB — CBC WITH DIFFERENTIAL/PLATELET
ABS IMMATURE GRANULOCYTES: 0 10*3/uL (ref 0.0–0.1)
BASOS PCT: 1 %
Basophils Absolute: 0.1 10*3/uL (ref 0.0–0.1)
EOS PCT: 4 %
Eosinophils Absolute: 0.3 10*3/uL (ref 0.0–0.7)
HCT: 41.1 % (ref 36.0–46.0)
Hemoglobin: 13 g/dL (ref 12.0–15.0)
Immature Granulocytes: 0 %
Lymphocytes Relative: 31 %
Lymphs Abs: 2.8 10*3/uL (ref 0.7–4.0)
MCH: 28.2 pg (ref 26.0–34.0)
MCHC: 31.6 g/dL (ref 30.0–36.0)
MCV: 89.2 fL (ref 78.0–100.0)
MONO ABS: 0.6 10*3/uL (ref 0.1–1.0)
MONOS PCT: 7 %
NEUTROS ABS: 5 10*3/uL (ref 1.7–7.7)
Neutrophils Relative %: 57 %
PLATELETS: 298 10*3/uL (ref 150–400)
RBC: 4.61 MIL/uL (ref 3.87–5.11)
RDW: 16.4 % — ABNORMAL HIGH (ref 11.5–15.5)
WBC: 8.9 10*3/uL (ref 4.0–10.5)

## 2018-06-08 LAB — COMPREHENSIVE METABOLIC PANEL
ALBUMIN: 3.6 g/dL (ref 3.5–5.0)
ALK PHOS: 64 U/L (ref 38–126)
ALT: 20 U/L (ref 0–44)
ANION GAP: 13 (ref 5–15)
AST: 31 U/L (ref 15–41)
BILIRUBIN TOTAL: 1.3 mg/dL — AB (ref 0.3–1.2)
BUN: 24 mg/dL — AB (ref 6–20)
CALCIUM: 9.4 mg/dL (ref 8.9–10.3)
CO2: 19 mmol/L — AB (ref 22–32)
CREATININE: 1.93 mg/dL — AB (ref 0.44–1.00)
Chloride: 107 mmol/L (ref 98–111)
GFR calc Af Amer: 33 mL/min — ABNORMAL LOW (ref >60)
GFR calc non Af Amer: 29 mL/min — ABNORMAL LOW (ref >60)
GLUCOSE: 76 mg/dL (ref 70–99)
Potassium: 3.8 mmol/L (ref 3.5–5.1)
SODIUM: 139 mmol/L (ref 135–145)
TOTAL PROTEIN: 7.2 g/dL (ref 6.5–8.1)

## 2018-06-08 LAB — LIPASE, BLOOD: Lipase: 30 U/L (ref 11–51)

## 2018-06-08 LAB — RAPID URINE DRUG SCREEN, HOSP PERFORMED
Amphetamines: NOT DETECTED
BARBITURATES: NOT DETECTED
Benzodiazepines: NOT DETECTED
COCAINE: POSITIVE — AB
OPIATES: NOT DETECTED
TETRAHYDROCANNABINOL: NOT DETECTED

## 2018-06-08 LAB — URINALYSIS, ROUTINE W REFLEX MICROSCOPIC
Bilirubin Urine: NEGATIVE
Glucose, UA: NEGATIVE mg/dL
Ketones, ur: 20 mg/dL — AB
Nitrite: NEGATIVE
Protein, ur: NEGATIVE mg/dL
Specific Gravity, Urine: 1.019 (ref 1.005–1.030)
Squamous Epithelial / LPF: >50 — ABNORMAL HIGH (ref 0–5)
pH: 6 (ref 5.0–8.0)

## 2018-06-08 LAB — I-STAT TROPONIN, ED: Troponin i, poc: 0.01 ng/mL (ref 0.00–0.08)

## 2018-06-08 LAB — TROPONIN I: Troponin I: <0.03 ng/mL (ref <0.03)

## 2018-06-08 IMAGING — CR DG CHEST 2V
2 series · 2 of 2 positions shown · non-contrast
Comparison: [DATE]

CLINICAL DATA: Chest pain

EXAM:
CHEST - 2 VIEW

[chest lat]
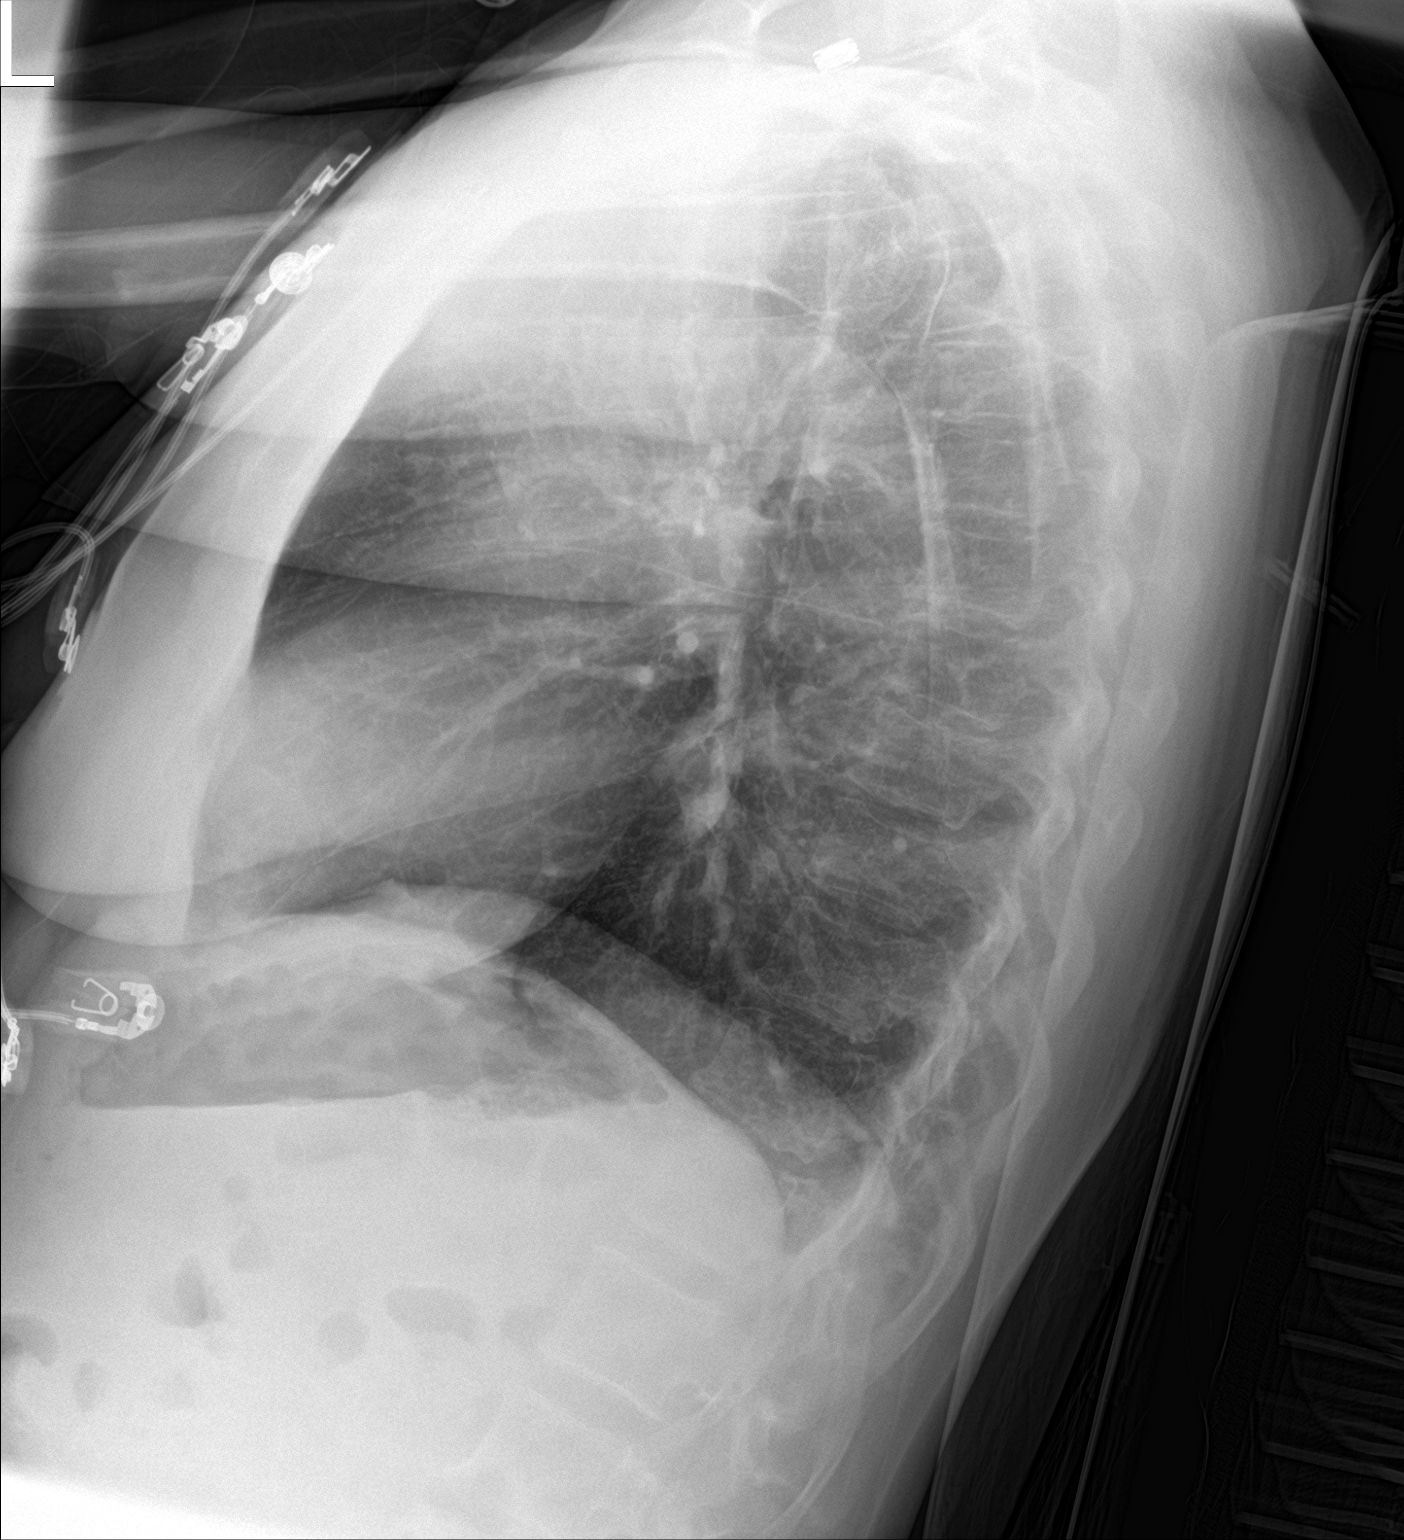

[chest ap]
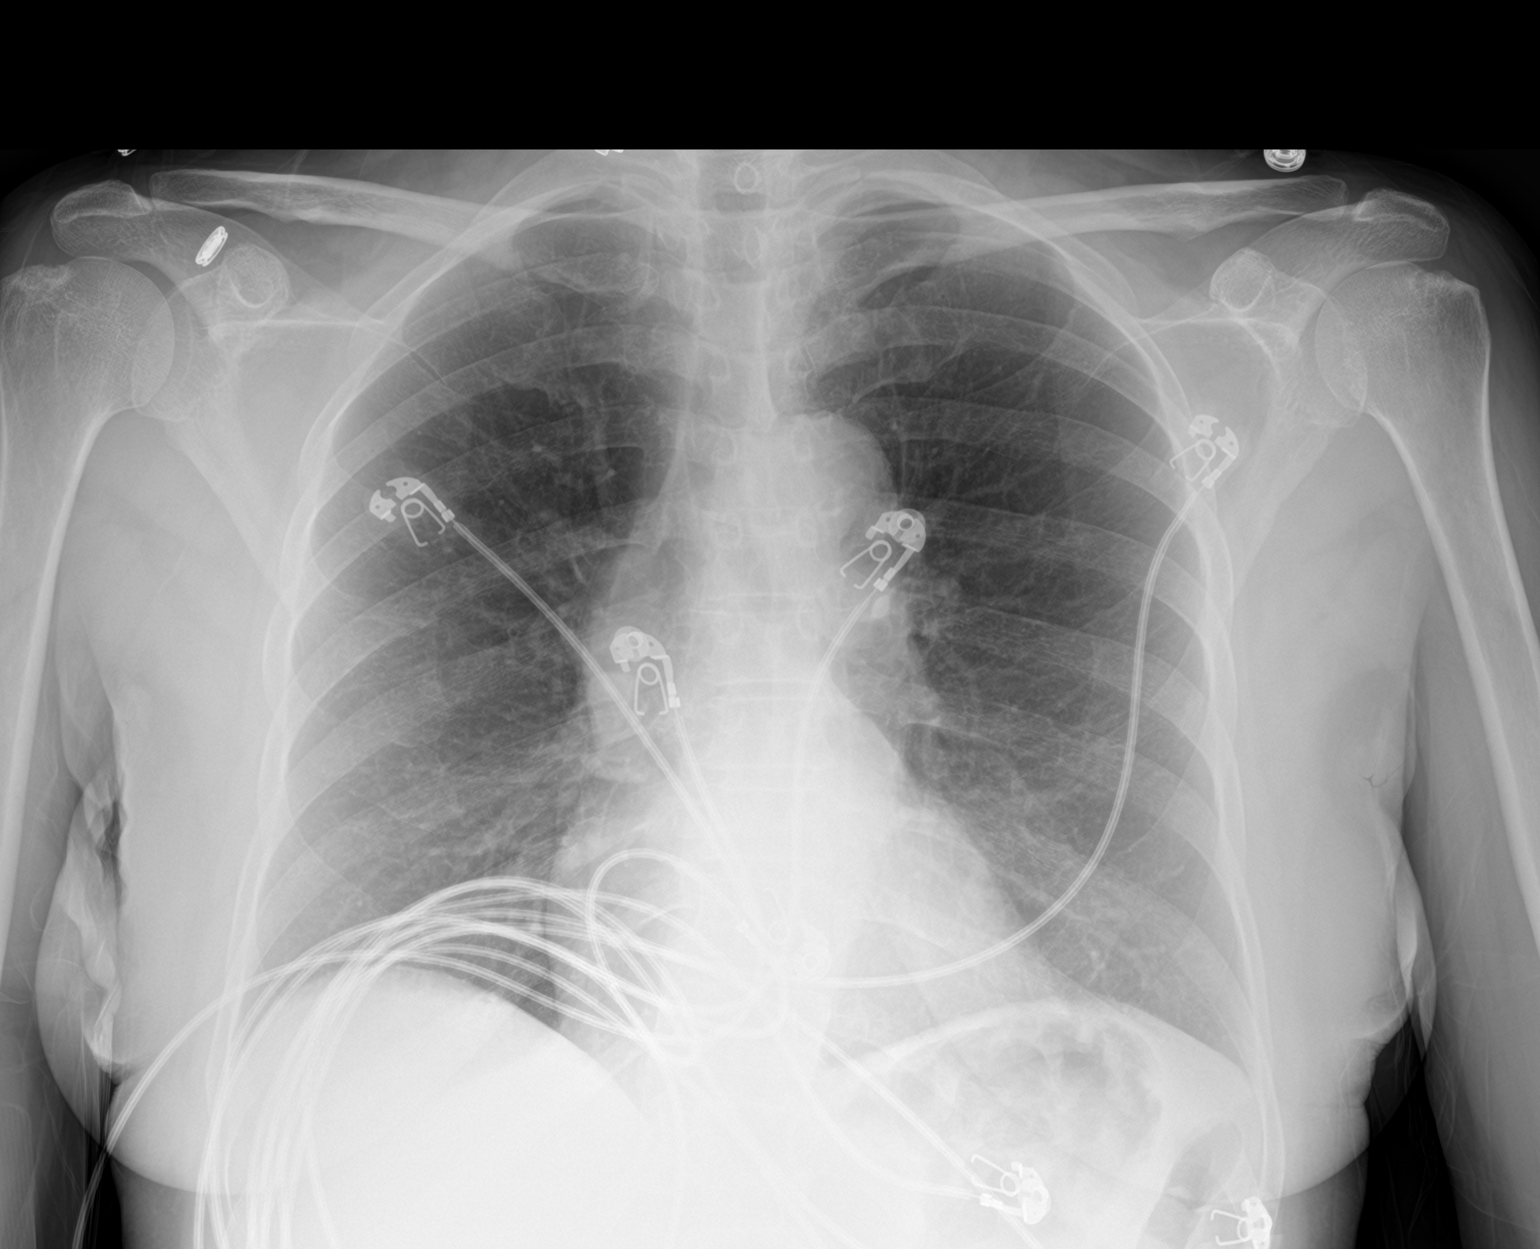

[2 of 2 positions shown; findings below may reference images not displayed]

FINDINGS: The heart size and mediastinal contours are within normal limits.
Both lungs are clear. The visualized skeletal structures are
unremarkable.
IMPRESSION: No active cardiopulmonary disease.

## 2018-06-08 MED ORDER — HYDROMORPHONE HCL 1 MG/ML IJ SOLN
0.5000 mg | Freq: Once | INTRAMUSCULAR | Status: AC
  Administered 2018-06-08: 0.5 mg via INTRAVENOUS
  Filled 2018-06-08: qty 1

## 2018-06-08 MED ORDER — NITROGLYCERIN 0.4 MG SL SUBL
0.4000 mg | SUBLINGUAL_TABLET | SUBLINGUAL | Status: DC | PRN
  Filled 2018-06-08: qty 1

## 2018-06-08 MED ORDER — SODIUM CHLORIDE 0.9 % IV BOLUS
1000.0000 mL | Freq: Once | INTRAVENOUS | Status: AC
Start: 2018-06-08 — End: 2018-06-08
  Administered 2018-06-08: 1000 mL via INTRAVENOUS

## 2018-06-08 MED ORDER — CEFTRIAXONE SODIUM 1 G IJ SOLR
1.0000 g | Freq: Once | INTRAMUSCULAR | Status: AC
  Administered 2018-06-08: 1 g via INTRAVENOUS
  Filled 2018-06-08: qty 10

## 2018-06-08 MED ORDER — LORAZEPAM 2 MG/ML IJ SOLN
1.0000 mg | Freq: Once | INTRAMUSCULAR | Status: AC
  Administered 2018-06-08: 1 mg via INTRAVENOUS
  Filled 2018-06-08: qty 1

## 2018-06-08 MED ORDER — NITROGLYCERIN 2 % TD OINT
1.0000 [in_us] | TOPICAL_OINTMENT | Freq: Once | TRANSDERMAL | Status: AC
  Administered 2018-06-09: 1 [in_us] via TOPICAL
  Filled 2018-06-08: qty 1

## 2018-06-08 MED ORDER — MORPHINE SULFATE (PF) 4 MG/ML IV SOLN
4.0000 mg | Freq: Once | INTRAVENOUS | Status: AC
  Administered 2018-06-08: 4 mg via INTRAVENOUS
  Filled 2018-06-08: qty 1

## 2018-06-08 NOTE — ED Provider Notes (Signed)
[facility] EMERGENCY DEPARTMENT Provider Note   CSN: [id number] Arrival date & time: 06/08/18  1629     History   Chief Complaint Chief Complaint  Patient presents with  . Chest Pain    HPI Denise Knight is a 52 y.o. female past medical history of bipolar, depression, GERD, hypertension, and NSTEMI who presents for evaluation of chest pain and shortness of breath that began this morning approximately 3 AM.  Patient states that the pain is sharp in the midsternal left chest area and does not radiate.  She states that coughing makes her pain worse.  Patient does not know if her pain is worse with exertion as she has been very weak and fatigued all day and has not done much activity.  She states that the pain is been constant since onset with no relief.  Patient states that at onset she did get slightly diaphoretic with the pain but states that has resolved.  She reports associated shortness of breath.  Patient states that her pain is worse with deep inspiration.  Patient reports she took one nitro which took her pain from a 10 to a 9.5.  Patient states she went to her primary care doctor's office for evaluation of symptoms and was brought here for further evaluation.  She also reports that she noticed today her urine was foul-smelling and she had some dysuria.  No hematuria noted.  Patient does endorse using cocaine 2 days ago.  She does report that she smokes cigarettes.  She states that her use is intermittent and states she will smoke 3-4 times a week.  She does have a history of high blood pressure but no diabetes.  Reports family history of cardiovascular disease.  denies any OCP use, recent immobilization, prior history of DVT/PE, recent surgery, leg swelling, or long travel. Patient has had previous MIs.  The history is provided by the patient.    Past Medical History:  Diagnosis Date  . Anxiety   . Arthritis    "left knee" (07/05/2016)  . Asthma   . Bipolar 1  disorder ([place])   . Depression   . GERD (gastroesophageal reflux disease)   . Hypertension   . MI (mitral incompetence)   . Post traumatic stress disorder   . Schizophrenia ([place])   . Stomach ulcer     Patient Active Problem List   Diagnosis Date Noted  . Cocaine abuse ([place]) 12/27/2017  . Rectal bleeding 12/27/2017  . NSTEMI (non-ST elevated myocardial infarction) ([place]) 12/23/2017  . Acute viral bronchitis 06/14/2017  . Asthma exacerbation 06/14/2017  . Chest pain 07/05/2016    Past Surgical History:  Procedure Laterality Date  . CARDIAC CATHETERIZATION  07/05/2016  . CARDIAC CATHETERIZATION N/A 07/05/2016   Procedure: Left Heart Cath and Coronary Angiography;  Surgeon: Adrian Prows, MD;  Location: [facility];  Service: Cardiovascular;  Laterality: N/A;  . CYST EXCISION Right    "wrist"  . DILATION AND CURETTAGE OF UTERUS    . FOOT SURGERY Bilateral    "corns removed"  . LEFT HEART CATH AND CORONARY ANGIOGRAPHY N/A 12/24/2017   Procedure: LEFT HEART CATH AND CORONARY ANGIOGRAPHY;  Surgeon: Nigel Mormon, MD;  Location: [facility];  Service: Cardiovascular;  Laterality: N/A;  . TUBAL LIGATION       OB History   None      Home Medications    Prior to Admission medications   Medication Sig Start Date End Date Taking? Authorizing  Provider  albuterol (PROVENTIL HFA;VENTOLIN HFA) 108 (90 BASE) MCG/ACT inhaler Inhale 2 puffs into the lungs every 6 (six) hours as needed for wheezing or shortness of breath.   Yes [provider]  loratadine (CLARITIN) 10 MG tablet Take 1 tablet (10 mg total) by mouth daily. 06/16/17  Yes Nedrud, Larena Glassman, MD  nitroGLYCERIN (NITROSTAT) 0.4 MG SL tablet Place 1 tablet (0.4 mg total) under the tongue every 5 (five) minutes x 3 doses as needed for chest pain. 02/11/18  Yes Law, Bea Graff, PA-C  omeprazole (PRILOSEC) 20 MG capsule Take 20 mg by mouth daily.    Yes [provider]  amLODipine (NORVASC) 5 MG tablet  Take 1 tablet (5 mg total) by mouth daily. Patient not taking: Reported on 06/01/2018 02/11/18   Frederica Kuster, PA-C  diclofenac sodium (VOLTAREN) 1 % GEL Apply 2 g topically 4 (four) times daily as needed (MSK pain associated with cough). Patient not taking: Reported on 10/29/2017 06/16/17   Thomasene Ripple, MD  docusate sodium (COLACE) 250 MG capsule Take 1 capsule (250 mg total) by mouth daily. Patient not taking: Reported on 06/08/2018 06/01/18   Duffy Bruce, MD  guaiFENesin-codeine 100-10 MG/5ML syrup Take 5 mLs by mouth every 4 (four) hours as needed for cough. Patient not taking: Reported on 08/18/2017 06/16/17   Thomasene Ripple, MD  hydrocortisone (ANUSOL-HC) 2.5 % rectal cream Apply rectally 2 times daily Patient not taking: Reported on 12/23/2017 10/29/17   Ocie Cornfield T, PA-C  Lidocaine, Anorectal, 5 % GEL Apply a pea-sized amount to the affected area 3 times daily. Patient not taking: Reported on 12/23/2017 10/29/17   Ocie Cornfield T, PA-C  nicotine (NICODERM CQ - DOSED IN MG/24 HR) 7 mg/24hr patch Place 1 patch (7 mg total) onto the skin daily. Patient not taking: Reported on 08/18/2017 06/17/17   Thomasene Ripple, MD  oxyCODONE-acetaminophen (PERCOCET) 5-325 MG tablet Take 1-2 tablets by mouth every 6 (six) hours as needed. Patient not taking: Reported on 08/18/2017 07/10/17   Little, Wenda Overland, MD    Family History Family History  Problem Relation Age of Onset  . Breast cancer Unknown   . Lung cancer Unknown   . Congestive Heart Failure Unknown   . Diabetes Unknown   . Hypertension Unknown     Social History Social History   Tobacco Use  . Smoking status: Current Some Day Smoker    Packs/day: 1.00    Years: 35.00    Pack years: 35.00    Types: Cigarettes  . Smokeless tobacco: Never Used  Substance Use Topics  . Alcohol use: Yes    Alcohol/week: 4.2 oz    Types: 7 Cans of beer per week  . Drug use: Yes    Types: Cocaine    Comment: last used cocaine  2014     Allergies   Aspirin and Food   Review of Systems Review of Systems  Constitutional: Negative for chills and fever.  Respiratory: Positive for shortness of breath. Negative for cough.   Cardiovascular: Positive for chest pain. Negative for leg swelling.  Gastrointestinal: Negative for abdominal pain, diarrhea, nausea and vomiting.  Genitourinary: Positive for dysuria. Negative for hematuria.  Musculoskeletal: Negative for back pain and neck pain.  Skin: Negative for rash.  Neurological: Negative for dizziness, weakness, numbness and headaches.  All other systems reviewed and are negative.    Physical Exam Updated Vital Signs BP 119/90   Pulse 73   Temp 97.8 F (36.6 C) (Oral)  Resp 15   SpO2 98%   Physical Exam  Constitutional: She is oriented to person, place, and time. She appears well-developed and well-nourished.  Appears uncomfortable but no acute distress.  HENT:  Head: Normocephalic and atraumatic.  Mouth/Throat: Oropharynx is clear and moist and mucous membranes are normal.  Eyes: Pupils are equal, round, and reactive to light. Conjunctivae, EOM and lids are normal.  Neck: Full passive range of motion without pain.  Cardiovascular: Normal rate, regular rhythm, normal heart sounds and normal pulses. Exam reveals no gallop and no friction rub.  No murmur heard. Pulmonary/Chest: Effort normal and breath sounds normal.  Lungs clear to auscultation bilaterally.  Symmetric chest rise.  No wheezing, rales, rhonchi.  Abdominal: Soft. Normal appearance. There is no tenderness. There is no rigidity and no guarding.  Musculoskeletal: Normal range of motion.  Bilateral lower extremities are symmetric in appearance.  Neurological: She is alert and oriented to person, place, and time.  Skin: Skin is warm and dry. Capillary refill takes less than 2 seconds.  Psychiatric: She has a normal mood and affect. Her speech is normal.  Nursing note and vitals  reviewed.    ED Treatments / Results  Labs (all labs ordered are listed, but only abnormal results are displayed) Labs Reviewed  URINALYSIS, ROUTINE W REFLEX MICROSCOPIC - Abnormal; Notable for the following components:      Result Value   APPearance CLOUDY (*)    Hgb urine dipstick MODERATE (*)    Ketones, ur 20 (*)    Leukocytes, UA LARGE (*)    Bacteria, UA FEW (*)    Squamous Epithelial / LPF >50 (*)    Trichomonas, UA PRESENT (*)    All other components within normal limits  COMPREHENSIVE METABOLIC PANEL - Abnormal; Notable for the following components:   CO2 19 (*)    BUN 24 (*)    Creatinine, Ser 1.93 (*)    Total Bilirubin 1.3 (*)    GFR calc non Af Amer 29 (*)    GFR calc Af Amer 33 (*)    All other components within normal limits  CBC WITH DIFFERENTIAL/PLATELET - Abnormal; Notable for the following components:   RDW 16.4 (*)    All other components within normal limits  RAPID URINE DRUG SCREEN, HOSP PERFORMED - Abnormal; Notable for the following components:   Cocaine POSITIVE (*)    All other components within normal limits  URINE CULTURE  LIPASE, BLOOD  TROPONIN I  I-STAT TROPONIN, ED  I-STAT TROPONIN, ED  I-STAT TROPONIN, ED    EKG EKG Interpretation  Date/Time:  Monday June 08 2018 16:42:58 EDT Ventricular Rate:  84 PR Interval:    QRS Duration: 80 QT Interval:  401 QTC Calculation: 474 R Axis:   9 Text Interpretation:  Sinus rhythm Borderline T wave abnormalities Confirmed by Virgel Manifold [phone]) on 06/08/2018 5:06:27 PM   Radiology Dg Chest 2 View  Result Date: 06/08/2018 CLINICAL DATA:  Chest pain EXAM: CHEST - 2 VIEW COMPARISON:  05/26/2018 FINDINGS: The heart size and mediastinal contours are within normal limits. Both lungs are clear. The visualized skeletal structures are unremarkable. IMPRESSION: No active cardiopulmonary disease. Electronically Signed   By: Ulyses Jarred M.D.   On: 06/08/2018 18:56    Procedures Procedures (including  critical care time)  Medications Ordered in ED Medications  nitroGLYCERIN (NITROSTAT) SL tablet 0.4 mg (has no administration in time range)  nitroGLYCERIN (NITROGLYN) 2 % ointment 1 inch (has no administration in time  range)  sodium chloride 0.9 % bolus 1,000 mL (0 mLs Intravenous Stopped 06/08/18 1904)  morphine 4 MG/ML injection 4 mg (4 mg Intravenous Given 06/08/18 1753)  cefTRIAXone (ROCEPHIN) 1 g in sodium chloride 0.9 % 100 mL IVPB (0 g Intravenous Stopped 06/08/18 2122)  LORazepam (ATIVAN) injection 1 mg (1 mg Intravenous Given 06/08/18 1949)  HYDROmorphone (DILAUDID) injection 0.5 mg (0.5 mg Intravenous Given 06/08/18 2303)     Initial Impression / Assessment and Plan / ED Course  I have reviewed the triage vital signs and the nursing notes.  Pertinent labs & imaging results that were available during my care of the patient were reviewed by me and considered in my medical decision making (see chart for details).     52 year old female bipolar, depression, GERD, hypertension, and NSTEMI who presents for evaluation of chest pain and shortness of breath that began approximately 3 AM.  Took 1 nitro with minimal improvement.  Does endorse recent cocaine smoking. Patient is afebrile, non-toxic appearing, sitting comfortably on examination table. Vital signs reviewed and stable.  Consider ACS etiology vs cocaine induced vasospasm versus infectious etiology versus anxiety versus muscular skeletal pain.  We will plan to check basic labs.  Additionally, will check UA given complaints.   Review of records show the patient had an end STEMI in February 2019 after cocaine abuse.  Coronary angiogram at that time showed distal apical LAD subtotal occlusion with thrombus without any significant stenosis in other areas.  Thought likely to be due to to cocaine induced vasospasm.   Lipase unremarkable.  Troponin negative.  CMP shows bicarb of 19, BUN of 24 and creatinine of 1.93.  This is a slight elevation  from her previous CMP done last week.  Patient does have elevation in total bili at 1.3.  TBC without any significant leukocytosis, anemia.  Patient had a CT abdomen pelvis done on 06/01/18 that showed no acute abnormality.  UDS is positive for cocaine.  Based on presentation and risk factors, she does have a HEART score of 5.  Plan to delta troponin.  Reevaluation.  Patient reports her chest pain improved after morphine.  Patient resting comfortably in bed.  Patient stating that her pain returned significantly.  Patient sitting in bed crying and stating that her pain chest pain is worse.  Sublingual nitro and additional analgesics ordered.  Given that patient is having active chest pain and does have high risk factors with recent cocaine use, plan for admission for serial troponins and observation.  Discussed patient with Dr. Hal Hope (hospitalist). Will admit.   Final Clinical Impressions(s) / ED Diagnoses   Final diagnoses:  Chest pain, unspecified type  Cocaine abuse [facility])    ED Discharge Orders    None       Desma Mcgregor 06/08/18 2359    Virgel Manifold, MD 06/12/18 1622

## 2018-06-08 NOTE — ED Notes (Signed)
Patient given [place] sandwich and PO fluids. Tolerating well.

## 2018-06-08 NOTE — ED Triage Notes (Signed)
Onset of substernal, nonrad chest pain at 0100, also cough which exacerbates her pain; also endorses foul smelling urine and dysuria

## 2018-06-09 ENCOUNTER — Encounter (HOSPITAL_COMMUNITY): Payer: Self-pay | Admitting: Internal Medicine

## 2018-06-09 LAB — CBC
HEMATOCRIT: 39.5 % (ref 36.0–46.0)
Hemoglobin: 12.4 g/dL (ref 12.0–15.0)
MCH: 28 pg (ref 26.0–34.0)
MCHC: 31.4 g/dL (ref 30.0–36.0)
MCV: 89.2 fL (ref 78.0–100.0)
Platelets: 272 10*3/uL (ref 150–400)
RBC: 4.43 MIL/uL (ref 3.87–5.11)
RDW: 16.5 % — AB (ref 11.5–15.5)
WBC: 8 10*3/uL (ref 4.0–10.5)

## 2018-06-09 LAB — URINE CULTURE

## 2018-06-09 LAB — CREATININE, SERUM
Creatinine, Ser: 1.42 mg/dL — ABNORMAL HIGH (ref 0.44–1.00)
GFR calc Af Amer: 48 mL/min — ABNORMAL LOW (ref >60)
GFR, EST NON AFRICAN AMERICAN: 41 mL/min — AB (ref >60)

## 2018-06-09 LAB — TROPONIN I: Troponin I: <0.03 ng/mL (ref <0.03)

## 2018-06-09 MED ORDER — METRONIDAZOLE IN NACL 5-0.79 MG/ML-% IV SOLN
500.0000 mg | Freq: Three times a day (TID) | INTRAVENOUS | Status: DC
  Administered 2018-06-09 (×2): 500 mg via INTRAVENOUS
  Filled 2018-06-09 (×2): qty 100

## 2018-06-09 MED ORDER — OXYCODONE HCL 5 MG PO TABS
5.0000 mg | ORAL_TABLET | Freq: Four times a day (QID) | ORAL | Status: DC | PRN
  Administered 2018-06-09: 5 mg via ORAL
  Filled 2018-06-09: qty 1

## 2018-06-09 MED ORDER — ACETAMINOPHEN 325 MG PO TABS: 650.0000 mg | ORAL_TABLET | ORAL | Status: DC | PRN

## 2018-06-09 MED ORDER — SODIUM CHLORIDE 0.9 % IV BOLUS
500.0000 mL | Freq: Once | INTRAVENOUS | Status: AC
  Administered 2018-06-09: 500 mL via INTRAVENOUS

## 2018-06-09 MED ORDER — WHITE PETROLATUM EX OINT
TOPICAL_OINTMENT | CUTANEOUS | Status: DC | PRN
  Administered 2018-06-09: 22:00:00 via TOPICAL
  Filled 2018-06-09: qty 28.35

## 2018-06-09 MED ORDER — ENOXAPARIN SODIUM 40 MG/0.4ML ~~LOC~~ SOLN
40.0000 mg | Freq: Every day | SUBCUTANEOUS | Status: DC
  Administered 2018-06-09 (×2): 40 mg via SUBCUTANEOUS
  Filled 2018-06-09 (×2): qty 0.4

## 2018-06-09 MED ORDER — MORPHINE SULFATE (PF) 4 MG/ML IV SOLN
2.0000 mg | INTRAVENOUS | Status: DC | PRN
  Administered 2018-06-09 (×3): 2 mg via INTRAVENOUS
  Filled 2018-06-09 (×3): qty 1

## 2018-06-09 MED ORDER — TRAMADOL HCL 50 MG PO TABS: 50.0000 mg | ORAL_TABLET | Freq: Four times a day (QID) | ORAL | Status: DC | PRN

## 2018-06-09 MED ORDER — METRONIDAZOLE 500 MG PO TABS
2000.0000 mg | ORAL_TABLET | Freq: Once | ORAL | Status: AC
  Administered 2018-06-10: 2000 mg via ORAL
  Filled 2018-06-09: qty 4

## 2018-06-09 MED ORDER — ONDANSETRON HCL 4 MG/2ML IJ SOLN: 4.0000 mg | Freq: Four times a day (QID) | INTRAMUSCULAR | Status: DC | PRN

## 2018-06-09 MED ORDER — CLOPIDOGREL BISULFATE 75 MG PO TABS
75.0000 mg | ORAL_TABLET | Freq: Every day | ORAL | Status: DC
  Administered 2018-06-09: 75 mg via ORAL
  Filled 2018-06-09: qty 1

## 2018-06-09 NOTE — Progress Notes (Addendum)
Patietn appear to be asleep and she stated that her pain was still a 9 and asking for Ice cream. R.N. aware

## 2018-06-09 NOTE — H&P (Addendum)
History and Physical    Denise Knight VPX:[id number] DOB: Jun 09, 1966 DOA: 06/08/2018  PCP: [facility]  Patient coming from: Home.  Chief Complaint: Chest pain.  HPI: Denise Knight is a 52 y.o. female with history of cocaine abuse, hypertension who was admitted in February of this year with chest pain at this time patient ruled in for MI and had cardiac cath and per cardiology patient was managed medically with aspirin and Plavix eventually had rectal bleeding and antiplatelet agents were withheld and patient's non-ST elevation MI was attributed to cocaine.  Patient has not followed up with gastroenterology as outpatient for the rectal bleeding.  Started developing chest pain yesterday morning.  Admits to have taken cocaine 2 days ago.  Chest pain is retrosternal nonradiating constant with no associated shortness of breath productive cough fever or chills.  Denies any nausea vomiting or abdominal pain.  ED Course: In the ER EKG shows normal sinus rhythm with nonspecific T wave changes.  Troponin was negative.  Patient continued to have persistent chest pressure and was placed on nitroglycerin patch.  Urine shows a lot of WBCs and a lot of squamous cells.  Also shows trichomonas.  Drug screen is positive.  Labs show acute renal failure.  Patient states he has been recently on lisinopril.  Review of Systems: As per HPI, rest all negative.   Past Medical History:  Diagnosis Date  . Anxiety   . Arthritis    "left knee" (07/05/2016)  . Asthma   . Bipolar 1 disorder ([place])   . Depression   . GERD (gastroesophageal reflux disease)   . Hypertension   . MI (mitral incompetence)   . Post traumatic stress disorder   . Schizophrenia ([place])   . Stomach ulcer     Past Surgical History:  Procedure Laterality Date  . CARDIAC CATHETERIZATION  07/05/2016  . CARDIAC CATHETERIZATION N/A 07/05/2016   Procedure: Left Heart Cath and Coronary Angiography;  Surgeon: Adrian Prows,  MD;  Location: [facility];  Service: Cardiovascular;  Laterality: N/A;  . CYST EXCISION Right    "wrist"  . DILATION AND CURETTAGE OF UTERUS    . FOOT SURGERY Bilateral    "corns removed"  . LEFT HEART CATH AND CORONARY ANGIOGRAPHY N/A 12/24/2017   Procedure: LEFT HEART CATH AND CORONARY ANGIOGRAPHY;  Surgeon: Nigel Mormon, MD;  Location: [facility];  Service: Cardiovascular;  Laterality: N/A;  . TUBAL LIGATION       reports that she has been smoking cigarettes.  She has a 35.00 pack-year smoking history. She has never used smokeless tobacco. She reports that she drinks about 4.2 oz of alcohol per week. She reports that she has current or past drug history. Drug: Cocaine.  Allergies  Allergen Reactions  . Aspirin Hives  . Food Hives    "Regular butter"    Family History  Problem Relation Age of Onset  . Breast cancer Unknown   . Lung cancer Unknown   . Congestive Heart Failure Unknown   . Diabetes Unknown   . Hypertension Unknown     Prior to Admission medications   Medication Sig Start Date End Date Taking? Authorizing Provider  albuterol (PROVENTIL HFA;VENTOLIN HFA) 108 (90 BASE) MCG/ACT inhaler Inhale 2 puffs into the lungs every 6 (six) hours as needed for wheezing or shortness of breath.   Yes [provider]  loratadine (CLARITIN) 10 MG tablet Take 1 tablet (10 mg total) by mouth daily. 06/16/17  Yes Nedrud, Larena Glassman, MD  nitroGLYCERIN (NITROSTAT) 0.4 MG SL tablet Place 1 tablet (0.4 mg total) under the tongue every 5 (five) minutes x 3 doses as needed for chest pain. 02/11/18  Yes Law, Bea Graff, PA-C  omeprazole (PRILOSEC) 20 MG capsule Take 20 mg by mouth daily.    Yes [provider]  amLODipine (NORVASC) 5 MG tablet Take 1 tablet (5 mg total) by mouth daily. Patient not taking: Reported on 06/01/2018 02/11/18   Frederica Kuster, PA-C  diclofenac sodium (VOLTAREN) 1 % GEL Apply 2 g topically 4 (four) times daily as needed (MSK pain  associated with cough). Patient not taking: Reported on 10/29/2017 06/16/17   Thomasene Ripple, MD  docusate sodium (COLACE) 250 MG capsule Take 1 capsule (250 mg total) by mouth daily. Patient not taking: Reported on 06/08/2018 06/01/18   Duffy Bruce, MD  guaiFENesin-codeine 100-10 MG/5ML syrup Take 5 mLs by mouth every 4 (four) hours as needed for cough. Patient not taking: Reported on 08/18/2017 06/16/17   Thomasene Ripple, MD  hydrocortisone (ANUSOL-HC) 2.5 % rectal cream Apply rectally 2 times daily Patient not taking: Reported on 12/23/2017 10/29/17   Ocie Cornfield T, PA-C  Lidocaine, Anorectal, 5 % GEL Apply a pea-sized amount to the affected area 3 times daily. Patient not taking: Reported on 12/23/2017 10/29/17   Ocie Cornfield T, PA-C  nicotine (NICODERM CQ - DOSED IN MG/24 HR) 7 mg/24hr patch Place 1 patch (7 mg total) onto the skin daily. Patient not taking: Reported on 08/18/2017 06/17/17   Thomasene Ripple, MD  oxyCODONE-acetaminophen (PERCOCET) 5-325 MG tablet Take 1-2 tablets by mouth every 6 (six) hours as needed. Patient not taking: Reported on 08/18/2017 07/10/17   Little, Wenda Overland, MD    Physical Exam: Vitals:   06/08/18 1900 06/08/18 2030 06/08/18 2100 06/08/18 2330  BP: (!) 129/98 (!) 147/102 113/75 119/90  Pulse: 70 92 95 73  Resp: 16 18 17 15   Temp:      TempSrc:      SpO2: 97% 96% 91% 98%      Constitutional: Moderately built and nourished. Vitals:   06/08/18 1900 06/08/18 2030 06/08/18 2100 06/08/18 2330  BP: (!) 129/98 (!) 147/102 113/75 119/90  Pulse: 70 92 95 73  Resp: 16 18 17 15   Temp:      TempSrc:      SpO2: 97% 96% 91% 98%   Eyes: Anicteric no pallor. ENMT: No discharge from the ears eyes nose or mouth. Neck: No JVD appreciated no mass felt. Respiratory: No rhonchi or crepitations. Cardiovascular: S1-S2 heard no murmurs appreciated. Abdomen: Soft nontender bowel sounds present. Musculoskeletal: No edema.  No joint effusion. Skin: No  rash. Neurologic: Alert awake oriented to time place and person.  Moves all extremities 5 x 5. Psychiatric: Appears normal.  Normal affect.   Labs on Admission: I have personally reviewed following labs and imaging studies  CBC: Recent Labs  Lab 06/08/18 1658  WBC 8.9  NEUTROABS 5.0  HGB 13.0  HCT 41.1  MCV 89.2  PLT 923   Basic Metabolic Panel: Recent Labs  Lab 06/08/18 1658  NA 139  K 3.8  CL 107  CO2 19*  GLUCOSE 76  BUN 24*  CREATININE 1.93*  CALCIUM 9.4   GFR: Estimated Creatinine Clearance: 33 mL/min (A) (by C-G formula based on SCr of 1.93 mg/dL (H)). Liver Function Tests: Recent Labs  Lab 06/08/18 1658  AST 31  ALT 20  ALKPHOS 64  BILITOT 1.3*  PROT 7.2  ALBUMIN 3.6   Recent Labs  Lab 06/08/18 1658  LIPASE 30   No results for input(s): AMMONIA in the last 168 hours. Coagulation Profile: No results for input(s): INR, PROTIME in the last 168 hours. Cardiac Enzymes: Recent Labs  Lab 06/08/18 1756  TROPONINI <0.03   BNP (last 3 results) No results for input(s): PROBNP in the last 8760 hours. HbA1C: No results for input(s): HGBA1C in the last 72 hours. CBG: No results for input(s): GLUCAP in the last 168 hours. Lipid Profile: No results for input(s): CHOL, HDL, LDLCALC, TRIG, CHOLHDL, LDLDIRECT in the last 72 hours. Thyroid Function Tests: No results for input(s): TSH, T4TOTAL, FREET4, T3FREE, THYROIDAB in the last 72 hours. Anemia Panel: No results for input(s): VITAMINB12, FOLATE, FERRITIN, TIBC, IRON, RETICCTPCT in the last 72 hours. Urine analysis:    Component Value Date/Time   COLORURINE YELLOW 06/08/2018 1645   APPEARANCEUR CLOUDY (A) 06/08/2018 1645   LABSPEC 1.019 06/08/2018 1645   PHURINE 6.0 06/08/2018 1645   GLUCOSEU NEGATIVE 06/08/2018 1645   HGBUR MODERATE (A) 06/08/2018 1645   BILIRUBINUR NEGATIVE 06/08/2018 1645   KETONESUR 20 (A) 06/08/2018 1645   PROTEINUR NEGATIVE 06/08/2018 1645   UROBILINOGEN 0.2 06/21/2015 1603    NITRITE NEGATIVE 06/08/2018 1645   LEUKOCYTESUR LARGE (A) 06/08/2018 1645   Sepsis Labs: @LABRCNTIP (procalcitonin:4,lacticidven:4) )No results found for this or any previous visit (from the past 240 hour(s)).   Radiological Exams on Admission: Dg Chest 2 View  Result Date: 06/08/2018 CLINICAL DATA:  Chest pain EXAM: CHEST - 2 VIEW COMPARISON:  05/26/2018 FINDINGS: The heart size and mediastinal contours are within normal limits. Both lungs are clear. The visualized skeletal structures are unremarkable. IMPRESSION: No active cardiopulmonary disease. Electronically Signed   By: Ulyses Jarred M.D.   On: 06/08/2018 18:56    EKG: Independently reviewed.  Normal sinus rhythm with nonspecific changes.  Assessment/Plan Principal Problem:   Chest pain Active Problems:   Cocaine abuse ([place])    1. Chest pain concerning for angina likely induced by cocaine abuse.  Has had cardiac cath this February 2019.  We will cycle cardiac markers continue with nitroglycerin patch.  Avoid beta-blockers due to cocaine.  Aspirin.  N.p.o. except medications since patient has active chest pain.  May discuss with Dr. Einar Gip cardiologist in a.m. since Dr. Irven Shelling group to the cardiac cath. 2. History of hypertension.  Patient states he takes lisinopril.  Will hold off lisinopril due to acute renal failure.  PRN hydralazine. 3. Acute renal failure -cause not clear patient states she takes lisinopril.  Will hold lisinopril.  Give fluids recheck metabolic panel. 4. Trichomoniasis -on Flagyl. 5. Cocaine abuse -strongly advised to quit cocaine.  Patient was given ceftriaxone for possible UTI.  But UA does show a lot of squamous cell.  I have not dosed any further dose of ceftriaxone follow urine culture.   DVT prophylaxis: Lovenox.  Code Status: Full code. Family Communication: No family at the bedside. Disposition Plan: Home. Consults called: None. Admission status: Observation.   Rise Patience MD [facility] Pager [phone].  If 7PM-7AM, please contact night-coverage [email or web address] Password [email or web address]  06/09/2018, 12:16 AM

## 2018-06-09 NOTE — ED Notes (Signed)
Patient placed on hospital bed.  Given graham crackers and PO fluids. Tolerating well.

## 2018-06-09 NOTE — ED Notes (Signed)
Patient given [place] sandwich and PO fluids.

## 2018-06-09 NOTE — Progress Notes (Signed)
[facility] TEAM 1 - Stepdown/ICU TEAM  Denise Knight  ACZ:[id number] DOB: 1966/02/21 DOA: 06/08/2018 PCP: Medicine, [facility]    Brief Narrative:  52 y.o. female with a history of cocaine abuse and hypertension who was admitted in February 2019 with chest pain, ruled in for MI, and had a cardiac cath leading to the recommendation for medical management with aspirin and Plavix.  She however developed rectal bleeding and antiplatelet agents were withheld.  Ultimately her NSTEMI was attributed to cocaine use.  Patient did not follow up with GI as an outpatient for the rectal bleeding. She returned to the ED 7/29 c/o chest pain 24-48hrs after using cocaine.    In the ER her EKG noted normal sinus rhythm with nonspecific T wave changes.  Troponin was negative.  Patient continued to have persistent chest pressure and was placed on nitroglycerin patch.  Urine noted WBCs and trichomonas.  Drug screen was positive.  Labs noted acute renal failure.    Subjective: Pt is seen for a f/u visit.    Assessment & Plan:  Cocaine induced chest pain  cardiac cath February 2019 w/ no atherosclerotic CAD and with findings most c/w vasospasm induced change related to cocaine abuse - pt was advised to abstain from cocaine  Hypertension  Acute kidney injury  Recent Labs  Lab 06/08/18 1658 06/09/18 0016  CREATININE 1.93* 1.42*   Trichomoniasis on Flagyl  Cocaine abuse strongly advised again to quit cocaine  DVT prophylaxis: lovenox  Code Status: FULL CODE Family Communication: no family present at time of exam  Disposition Plan: if crt continues to improve 7/31 could likely be d/c home   Consultants:  none  Procedures: none  Antimicrobials:  Flagyl  7/30 - to get 1 large dose 7/31 to complete tx  Rocephin 7/29  Objective: Blood pressure 119/84, pulse 62, temperature 98.4 F (36.9 C), temperature source Oral, resp. rate (!) 23, height 5' 8.5" (1.74 m), weight 73.9 kg (163  lb), SpO2 94 %.  Intake/Output Summary (Last 24 hours) at 06/09/2018 1632 Last data filed at 06/09/2018 1212 Gross per 24 hour  Intake 0 ml  Output 700 ml  Net -700 ml   Filed Weights   06/09/18 0400  Weight: 73.9 kg (163 lb)    Examination: Pt was seen for a f/u visit.    CBC: Recent Labs  Lab 06/08/18 1658 06/09/18 0016  WBC 8.9 8.0  NEUTROABS 5.0  --   HGB 13.0 12.4  HCT 41.1 39.5  MCV 89.2 89.2  PLT 298 109   Basic Metabolic Panel: Recent Labs  Lab 06/08/18 1658 06/09/18 0016  NA 139  --   K 3.8  --   CL 107  --   CO2 19*  --   GLUCOSE 76  --   BUN 24*  --   CREATININE 1.93* 1.42*  CALCIUM 9.4  --    GFR: Estimated Creatinine Clearance: 47.1 mL/min (A) (by C-G formula based on SCr of 1.42 mg/dL (H)).  Liver Function Tests: Recent Labs  Lab 06/08/18 1658  AST 31  ALT 20  ALKPHOS 64  BILITOT 1.3*  PROT 7.2  ALBUMIN 3.6   Recent Labs  Lab 06/08/18 1658  LIPASE 30    Cardiac Enzymes: Recent Labs  Lab 06/08/18 1756 06/09/18 0016 06/09/18 0706 06/09/18 1241  TROPONINI <0.03 <0.03 <0.03 <0.03    HbA1C: Hgb A1c MFr Bld  Date/Time Value Ref Range Status  06/15/2017 04:47 AM 5.8 (H) 4.8 -  5.6 % Final    Comment:    (NOTE)         Pre-diabetes: 5.7 - 6.4         Diabetes: >6.4         Glycemic control for adults with diabetes: <7.0      Recent Results (from the past 240 hour(s))  Urine culture     Status: Abnormal   Collection Time: 06/08/18  4:39 PM  Result Value Ref Range Status   Specimen Description URINE, RANDOM  Final   Special Requests   Final    NONE Performed at [facility], [address] [place], [place] [address]    Culture MULTIPLE SPECIES PRESENT, SUGGEST RECOLLECTION (A)  Final   Report Status 06/09/2018 FINAL  Final     Scheduled Meds: . clopidogrel  75 mg Oral Daily  . enoxaparin (LOVENOX) injection  40 mg Subcutaneous QHS   Continuous Infusions: . metronidazole 500 mg (06/09/18 1544)     LOS: 0  days   Time spent: No Charge  Cherene Altes, MD [facility] Office  [phone] Pager - Text Page per Amion as per below:  On-Call/Text Page:      [email or web address]      password [email or web address]  If 7PM-7AM, please contact night-coverage [email or web address] Password [email or web address] 06/09/2018, 4:32 PM

## 2018-06-09 NOTE — Progress Notes (Addendum)
Patient c/o  Of [facility] constant s.s.c.p. Skin warm and dry resp. Even and unlabour. Refused NTG." It causes me to have an headache."Patietn stated. Then patient precedes to eat the remaining of her salad.V.S.S. Oxycodone one p.o. Given. R.N. aware

## 2018-06-09 NOTE — ED Notes (Addendum)
Patient given [place] sandwich.

## 2018-06-10 DIAGNOSIS — R079 Chest pain, unspecified: Secondary | ICD-10-CM

## 2018-06-10 DIAGNOSIS — F1410 Cocaine abuse, uncomplicated: Secondary | ICD-10-CM

## 2018-06-10 LAB — BASIC METABOLIC PANEL
Anion gap: 7 (ref 5–15)
BUN: 11 mg/dL (ref 6–20)
CHLORIDE: 107 mmol/L (ref 98–111)
CO2: 26 mmol/L (ref 22–32)
CREATININE: 1.07 mg/dL — AB (ref 0.44–1.00)
Calcium: 9.3 mg/dL (ref 8.9–10.3)
GFR calc Af Amer: >60 mL/min (ref >60)
GFR calc non Af Amer: 58 mL/min — ABNORMAL LOW (ref >60)
GLUCOSE: 137 mg/dL — AB (ref 70–99)
POTASSIUM: 3.4 mmol/L — AB (ref 3.5–5.1)
SODIUM: 140 mmol/L (ref 135–145)

## 2018-06-10 MED ORDER — AMLODIPINE BESYLATE 5 MG PO TABS: 5.0000 mg | ORAL_TABLET | Freq: Every day | ORAL | 0 refills | Status: DC

## 2018-06-10 NOTE — Progress Notes (Signed)
When patient asleep sometimes her H.R. Drops in the 40's S.B. Then comes back up to S.R. 60's

## 2018-06-10 NOTE — Discharge Summary (Signed)
Physician Discharge Summary  Denise Knight KCL:[id number] DOB: 1966/04/02 DOA: 06/08/2018  PCP: Medicine, [facility]  Admit date: 06/08/2018 Discharge date: 06/10/2018  Admitted From: home Discharge disposition: home   Recommendations for Outpatient Follow-Up:   1. Cocaine cessation   Discharge Diagnosis:   Principal Problem:   Chest pain Active Problems:   Cocaine abuse [facility])    Discharge Condition: Improved.  Diet recommendation: Low sodium, heart healthy  Wound care: None.  Code status: Full.   History of Present Illness:   Denise Knight is a 52 y.o. female with history of cocaine abuse, hypertension who was admitted in February of this year with chest pain at this time patient ruled in for MI and had cardiac cath and per cardiology patient was managed medically with aspirin and Plavix eventually had rectal bleeding and antiplatelet agents were withheld and patient's non-ST elevation MI was attributed to cocaine.  Patient has not followed up with gastroenterology as outpatient for the rectal bleeding.  Started developing chest pain yesterday morning.  Admits to have taken cocaine 2 days ago.  Chest pain is retrosternal nonradiating constant with no associated shortness of breath productive cough fever or chills.  Denies any nausea vomiting or abdominal pain.  ED Course: In the ER EKG shows normal sinus rhythm with nonspecific T wave changes.  Troponin was negative.  Patient continued to have persistent chest pressure and was placed on nitroglycerin patch.  Urine shows a lot of WBCs and a lot of squamous cells.  Also shows trichomonas.  Drug screen is positive.  Labs show acute renal failure.  Patient states he has been recently on lisinopril.     Hospital Course by Problem:   Cocaine induced chest pain  cardiac cath February 2019 w/ no atherosclerotic CAD and with findings most c/w vasospasm induced change related to cocaine abuse - pt was  advised to abstain from cocaine  Hypertension -due to cocaine -outpt follow up?  Acute kidney injury -improved with IVF  Trichomoniasis s/p Flagyl  Cocaine abuse strongly advised again to quit cocaine  Hypokalemia -repleted      Medical Consultants:      Discharge Exam:   Vitals:   06/09/18 1954 06/10/18 0434  BP: (!) 129/96 (!) 120/93  Pulse: 67   Resp: (!) 24 15  Temp: 98.3 F (36.8 C) 97.9 F (36.6 C)  SpO2: 99% 96%   Vitals:   06/09/18 1330 06/09/18 1345 06/09/18 1954 06/10/18 0434  BP: (!) 121/92 119/84 (!) 129/96 (!) 120/93  Pulse:   67   Resp: (!) 24 (!) 23 (!) 24 15  Temp:   98.3 F (36.8 C) 97.9 F (36.6 C)  TempSrc:   Oral Oral  SpO2:   99% 96%  Weight:      Height:        General exam: Appears calm and comfortable.    The results of significant diagnostics from this hospitalization (including imaging, microbiology, ancillary and laboratory) are listed below for reference.     Procedures and Diagnostic Studies:   Dg Chest 2 View  Result Date: 06/08/2018 CLINICAL DATA:  Chest pain EXAM: CHEST - 2 VIEW COMPARISON:  05/26/2018 FINDINGS: The heart size and mediastinal contours are within normal limits. Both lungs are clear. The visualized skeletal structures are unremarkable. IMPRESSION: No active cardiopulmonary disease. Electronically Signed   By: Ulyses Jarred M.D.   On: 06/08/2018 18:56     Labs:   Basic Metabolic Panel: Recent  Labs  Lab 06/08/18 1658 06/09/18 0016 06/10/18 0406  NA 139  --  140  K 3.8  --  3.4*  CL 107  --  107  CO2 19*  --  26  GLUCOSE 76  --  137*  BUN 24*  --  11  CREATININE 1.93* 1.42* 1.07*  CALCIUM 9.4  --  9.3   GFR Estimated Creatinine Clearance: 62.5 mL/min (A) (by C-G formula based on SCr of 1.07 mg/dL (H)). Liver Function Tests: Recent Labs  Lab 06/08/18 1658  AST 31  ALT 20  ALKPHOS 64  BILITOT 1.3*  PROT 7.2  ALBUMIN 3.6   Recent Labs  Lab 06/08/18 1658  LIPASE 30   No  results for input(s): AMMONIA in the last 168 hours. Coagulation profile No results for input(s): INR, PROTIME in the last 168 hours.  CBC: Recent Labs  Lab 06/08/18 1658 06/09/18 0016  WBC 8.9 8.0  NEUTROABS 5.0  --   HGB 13.0 12.4  HCT 41.1 39.5  MCV 89.2 89.2  PLT 298 272   Cardiac Enzymes: Recent Labs  Lab 06/08/18 1756 06/09/18 0016 06/09/18 0706 06/09/18 1241  TROPONINI <0.03 <0.03 <0.03 <0.03   BNP: Invalid input(s): POCBNP CBG: No results for input(s): GLUCAP in the last 168 hours. D-Dimer No results for input(s): DDIMER in the last 72 hours. Hgb A1c No results for input(s): HGBA1C in the last 72 hours. Lipid Profile No results for input(s): CHOL, HDL, LDLCALC, TRIG, CHOLHDL, LDLDIRECT in the last 72 hours. Thyroid function studies No results for input(s): TSH, T4TOTAL, T3FREE, THYROIDAB in the last 72 hours.  Invalid input(s): FREET3 Anemia work up No results for input(s): VITAMINB12, FOLATE, FERRITIN, TIBC, IRON, RETICCTPCT in the last 72 hours. Microbiology Recent Results (from the past 240 hour(s))  Urine culture     Status: Abnormal   Collection Time: 06/08/18  4:39 PM  Result Value Ref Range Status   Specimen Description URINE, RANDOM  Final   Special Requests   Final    NONE Performed at [facility], [address], [place], [place] [address]    Culture MULTIPLE SPECIES PRESENT, SUGGEST RECOLLECTION (A)  Final   Report Status 06/09/2018 FINAL  Final     Discharge Instructions:   Discharge Instructions    Diet - low sodium heart healthy   Complete by:  As directed    Increase activity slowly   Complete by:  As directed      Allergies as of 06/10/2018      Reactions   Aspirin Hives   Food Hives   "Regular butter"      Medication List    STOP taking these medications   diclofenac sodium 1 % Gel Commonly known as:  VOLTAREN   docusate sodium 250 MG capsule Commonly known as:  COLACE   guaiFENesin-codeine 100-10 MG/5ML  syrup   hydrocortisone 2.5 % rectal cream Commonly known as:  ANUSOL-HC   Lidocaine (Anorectal) 5 % Gel   nicotine 7 mg/24hr patch Commonly known as:  NICODERM CQ - dosed in mg/24 hr   oxyCODONE-acetaminophen 5-325 MG tablet Commonly known as:  PERCOCET     TAKE these medications   albuterol 108 (90 Base) MCG/ACT inhaler Commonly known as:  PROVENTIL HFA;VENTOLIN HFA Inhale 2 puffs into the lungs every 6 (six) hours as needed for wheezing or shortness of breath.   amLODipine 5 MG tablet Commonly known as:  NORVASC Take 1 tablet (5 mg total) by mouth daily.  loratadine 10 MG tablet Commonly known as:  CLARITIN Take 1 tablet (10 mg total) by mouth daily.   nitroGLYCERIN 0.4 MG SL tablet Commonly known as:  NITROSTAT Place 1 tablet (0.4 mg total) under the tongue every 5 (five) minutes x 3 doses as needed for chest pain.   omeprazole 20 MG capsule Commonly known as:  PRILOSEC Take 20 mg by mouth daily.      Follow-up Information    Medicine, [facility] Follow up in 1 week(s).   Contact information: [place] [address] [phone]            Time coordinating discharge: 25 min  Signed:  Geradine Girt  [facility] 06/10/2018, 8:16 AM

## 2018-06-10 NOTE — Discharge Instructions (Signed)
Cocaine cessation Talk with PCP regarding chantix

## 2018-06-10 NOTE — Progress Notes (Signed)
Clinical Social Worker was consulted by RN to assist patient with discharging home. CSW spoke with patient who verified her home address. Patient stated she would rather get assistance in going to a friends house because her car is near the residence. Patient stated she is comfortable being on a bus and has no problems using the public transportation. CSW assisted patient with bus pass.   Rhea Pink, MSW,  [place]

## 2018-06-26 ENCOUNTER — Emergency Department (HOSPITAL_COMMUNITY): Payer: Self-pay

## 2018-06-26 ENCOUNTER — Encounter (HOSPITAL_COMMUNITY): Payer: Self-pay | Admitting: Emergency Medicine

## 2018-06-26 ENCOUNTER — Emergency Department (HOSPITAL_COMMUNITY)
Admission: EM | Admit: 2018-06-26 | Discharge: 2018-06-26 | Disposition: A | Discharge status: Home / Self Care | Payer: Self-pay | Attending: Emergency Medicine | Admitting: Emergency Medicine

## 2018-06-26 ENCOUNTER — Other Ambulatory Visit: Payer: Self-pay

## 2018-06-26 DIAGNOSIS — I10 Essential (primary) hypertension: Secondary | ICD-10-CM | POA: Insufficient documentation

## 2018-06-26 DIAGNOSIS — J45909 Unspecified asthma, uncomplicated: Secondary | ICD-10-CM | POA: Insufficient documentation

## 2018-06-26 DIAGNOSIS — Z79899 Other long term (current) drug therapy: Secondary | ICD-10-CM | POA: Insufficient documentation

## 2018-06-26 DIAGNOSIS — F1410 Cocaine abuse, uncomplicated: Secondary | ICD-10-CM | POA: Insufficient documentation

## 2018-06-26 DIAGNOSIS — R079 Chest pain, unspecified: Secondary | ICD-10-CM | POA: Insufficient documentation

## 2018-06-26 DIAGNOSIS — F17210 Nicotine dependence, cigarettes, uncomplicated: Secondary | ICD-10-CM | POA: Insufficient documentation

## 2018-06-26 LAB — CBC
HCT: 39.2 % (ref 36.0–46.0)
HEMOGLOBIN: 12.5 g/dL (ref 12.0–15.0)
MCH: 28.4 pg (ref 26.0–34.0)
MCHC: 31.9 g/dL (ref 30.0–36.0)
MCV: 89.1 fL (ref 78.0–100.0)
Platelets: 290 10*3/uL (ref 150–400)
RBC: 4.4 MIL/uL (ref 3.87–5.11)
RDW: 16.2 % — ABNORMAL HIGH (ref 11.5–15.5)
WBC: 9.5 10*3/uL (ref 4.0–10.5)

## 2018-06-26 LAB — BASIC METABOLIC PANEL
ANION GAP: 11 (ref 5–15)
BUN: 7 mg/dL (ref 6–20)
CALCIUM: 8.9 mg/dL (ref 8.9–10.3)
CO2: 21 mmol/L — ABNORMAL LOW (ref 22–32)
CREATININE: 1.17 mg/dL — AB (ref 0.44–1.00)
Chloride: 109 mmol/L (ref 98–111)
GFR, EST NON AFRICAN AMERICAN: 52 mL/min — AB (ref >60)
GLUCOSE: 81 mg/dL (ref 70–99)
Potassium: 3.5 mmol/L (ref 3.5–5.1)
Sodium: 141 mmol/L (ref 135–145)

## 2018-06-26 LAB — I-STAT BETA HCG BLOOD, ED (MC, WL, AP ONLY): I-stat hCG, quantitative: <5 m[IU]/mL (ref <5)

## 2018-06-26 LAB — I-STAT TROPONIN, ED
TROPONIN I, POC: 0.02 ng/mL (ref 0.00–0.08)
TROPONIN I, POC: 0.02 ng/mL (ref 0.00–0.08)

## 2018-06-26 IMAGING — DX DG CHEST 2V
2 series · 2 of 2 positions shown · non-contrast
Comparison: [DATE]

CLINICAL DATA: Right-sided chest pain and shortness-of-breath with
fatigue and headache since this morning.

EXAM:
CHEST - 2 VIEW

[chest ap]
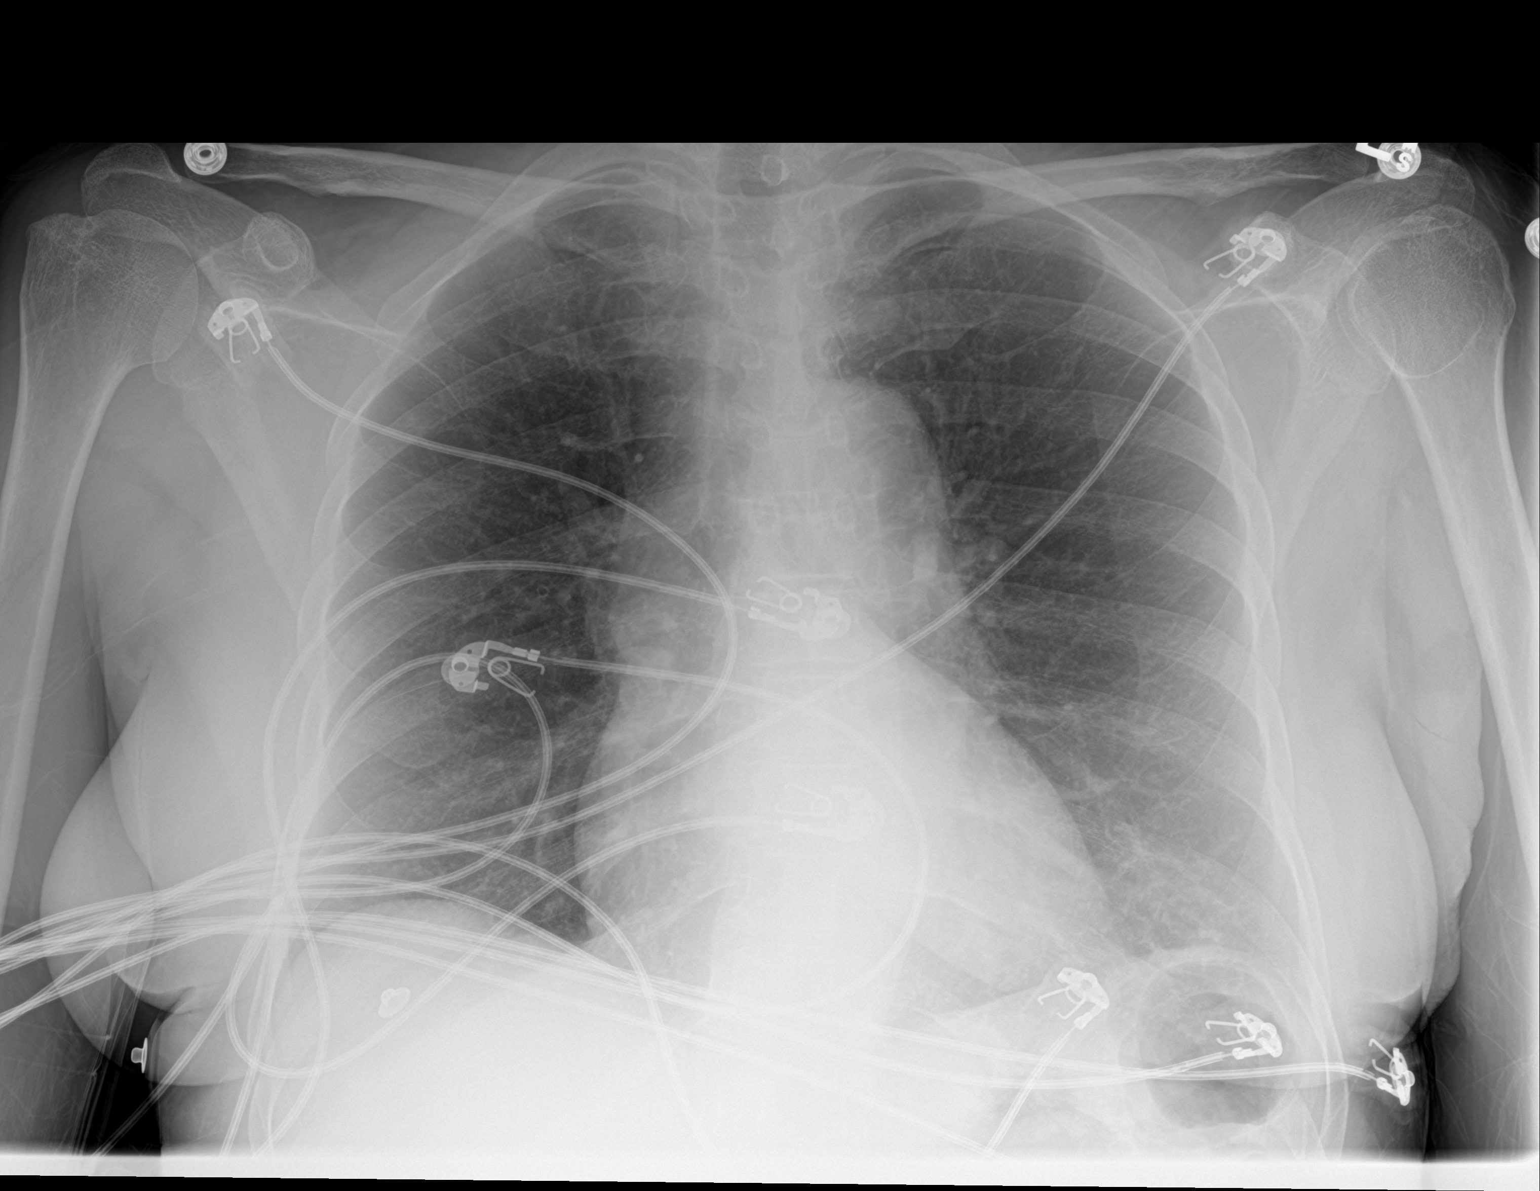

[chest lat]
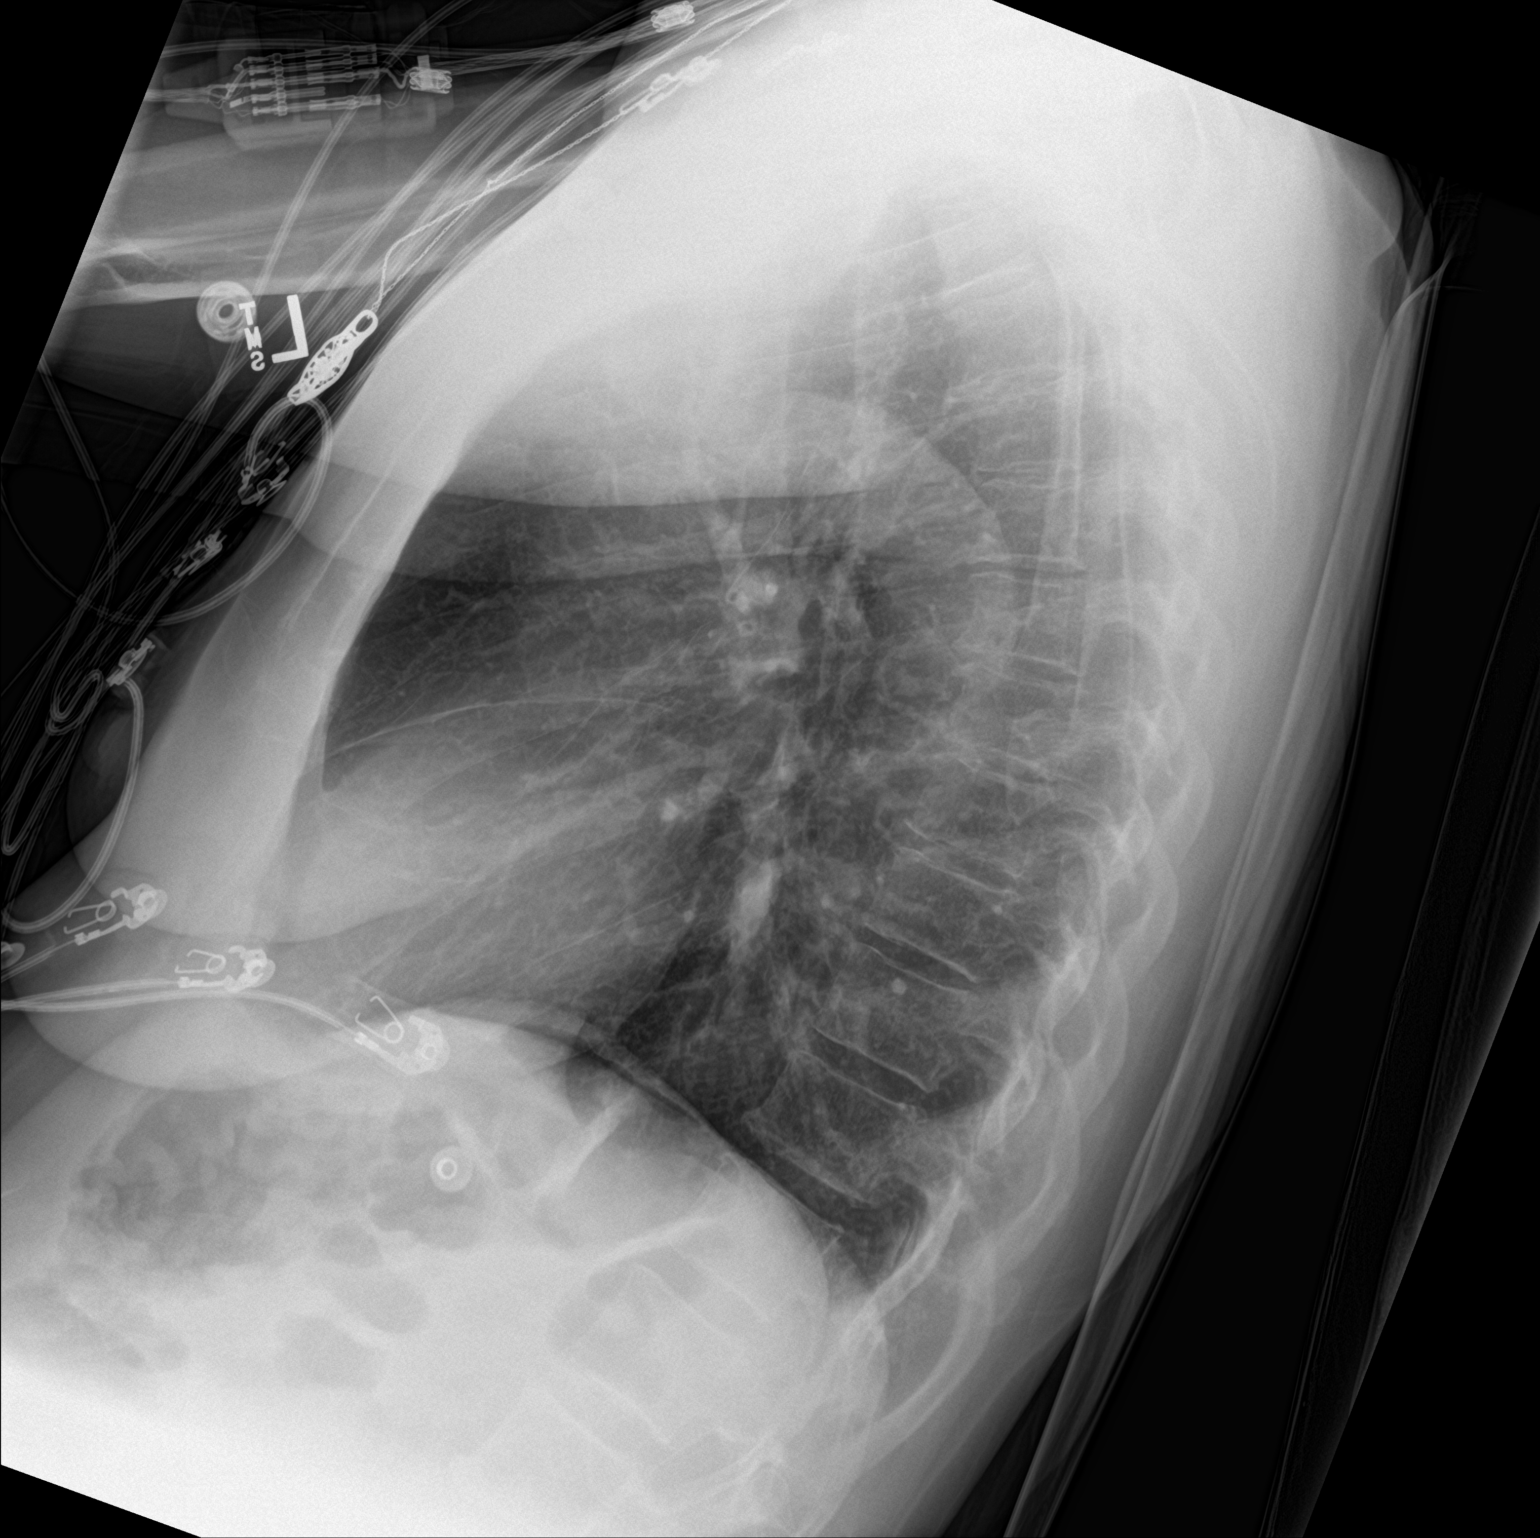

[2 of 2 positions shown; findings below may reference images not displayed]

FINDINGS: Lungs are adequately inflated and otherwise clear. Cardiomediastinal
silhouette and remainder the exam is unchanged.
IMPRESSION: No active cardiopulmonary disease.

## 2018-06-26 MED ORDER — FENTANYL CITRATE (PF) 100 MCG/2ML IJ SOLN
50.0000 ug | Freq: Once | INTRAMUSCULAR | Status: AC
  Administered 2018-06-26: 50 ug via INTRAVENOUS
  Filled 2018-06-26: qty 2

## 2018-06-26 NOTE — ED Provider Notes (Signed)
[facility] EMERGENCY DEPARTMENT Provider Note   CSN: [id number] Arrival date & time: 06/26/18  0745     History   Chief Complaint Chief Complaint  Patient presents with  . Chest Pain    HPI Denise Knight is a 52 y.o. female.  52 year old female with past medical history including bipolar disorder, PTSD, GERD, cocaine use who presents with chest pain.  Patient was awakened from sleep around 2 AM with severe, 10/10 in intensity right-sided chest pain that radiates to her left shoulder.  This is the same pain that she had at the end of July for which she was hospitalized.  She took nitroglycerin a total of 3 times with no relief.  Now she has a headache.  When EMS arrived, she was initially hypotensive but improved after fluids.  She endorses nausea, no vomiting or SOB. She last used cocaine last night.  Denies any cough, fevers, or recent illness.  No other drug use.  The history is provided by the patient.  Chest Pain      Past Medical History:  Diagnosis Date  . Anxiety   . Arthritis    "left knee" (07/05/2016)  . Asthma   . Bipolar 1 disorder ([place])   . Depression   . GERD (gastroesophageal reflux disease)   . Hypertension   . MI (mitral incompetence)   . Post traumatic stress disorder   . Schizophrenia ([place])   . Stomach ulcer     Patient Active Problem List   Diagnosis Date Noted  . Cocaine abuse ([place]) 12/27/2017  . Rectal bleeding 12/27/2017  . NSTEMI (non-ST elevated myocardial infarction) ([place]) 12/23/2017  . Acute viral bronchitis 06/14/2017  . Asthma exacerbation 06/14/2017  . Chest pain 07/05/2016    Past Surgical History:  Procedure Laterality Date  . CARDIAC CATHETERIZATION  07/05/2016  . CARDIAC CATHETERIZATION N/A 07/05/2016   Procedure: Left Heart Cath and Coronary Angiography;  Surgeon: Adrian Prows, MD;  Location: [facility];  Service: Cardiovascular;  Laterality: N/A;  . CYST EXCISION Right    "wrist"  . DILATION AND  CURETTAGE OF UTERUS    . FOOT SURGERY Bilateral    "corns removed"  . LEFT HEART CATH AND CORONARY ANGIOGRAPHY N/A 12/24/2017   Procedure: LEFT HEART CATH AND CORONARY ANGIOGRAPHY;  Surgeon: Nigel Mormon, MD;  Location: [facility];  Service: Cardiovascular;  Laterality: N/A;  . TUBAL LIGATION       OB History   None      Home Medications    Prior to Admission medications   Medication Sig Start Date End Date Taking? Authorizing Provider  albuterol (PROVENTIL HFA;VENTOLIN HFA) 108 (90 BASE) MCG/ACT inhaler Inhale 2 puffs into the lungs every 6 (six) hours as needed for wheezing or shortness of breath.   Yes [provider]  amLODipine (NORVASC) 5 MG tablet Take 1 tablet (5 mg total) by mouth daily. 06/10/18  Yes Vann, Jessica U, DO  nitroGLYCERIN (NITROSTAT) 0.4 MG SL tablet Place 1 tablet (0.4 mg total) under the tongue every 5 (five) minutes x 3 doses as needed for chest pain. 02/11/18  Yes Law, Bea Graff, PA-C  omeprazole (PRILOSEC) 20 MG capsule Take 20 mg by mouth daily.    Yes [provider]  loratadine (CLARITIN) 10 MG tablet Take 1 tablet (10 mg total) by mouth daily. Patient not taking: Reported on 06/26/2018 06/16/17   Thomasene Ripple, MD    Family History Family History  Problem Relation  Age of Onset  . Breast cancer Unknown   . Lung cancer Unknown   . Congestive Heart Failure Unknown   . Diabetes Unknown   . Hypertension Unknown     Social History Social History   Tobacco Use  . Smoking status: Current Some Day Smoker    Packs/day: 1.00    Years: 35.00    Pack years: 35.00    Types: Cigarettes  . Smokeless tobacco: Never Used  Substance Use Topics  . Alcohol use: Yes    Alcohol/week: 7.0 standard drinks    Types: 7 Cans of beer per week  . Drug use: Yes    Types: Cocaine    Comment: last used cocaine 2014     Allergies   Aspirin and Food   Review of Systems Review of Systems  Cardiovascular: Positive for chest  pain.   All other systems reviewed and are negative except that which was mentioned in HPI   Physical Exam Updated Vital Signs BP 123/88   Pulse 65   Resp 16   Ht 5' 8"  (1.727 m)   Wt 73 kg   SpO2 98%   BMI 24.47 kg/m   Physical Exam  Constitutional: She is oriented to person, place, and time. She appears well-developed and well-nourished. She does not appear ill.  uncomfortable  HENT:  Head: Normocephalic and atraumatic.  Moist mucous membranes  Eyes: Conjunctivae are normal.  Neck: Neck supple.  Cardiovascular: Normal rate, regular rhythm and normal heart sounds.  No murmur heard. Pulmonary/Chest: Effort normal and breath sounds normal.  Abdominal: Soft. Bowel sounds are normal. She exhibits no distension. There is no tenderness.  Musculoskeletal: She exhibits no edema.  Neurological: She is alert and oriented to person, place, and time.  Fluent speech  Skin: Skin is warm and dry.  Psychiatric: She has a normal mood and affect. Judgment normal.  Nursing note and vitals reviewed.    ED Treatments / Results  Labs (all labs ordered are listed, but only abnormal results are displayed) Labs Reviewed  BASIC METABOLIC PANEL - Abnormal; Notable for the following components:      Result Value   CO2 21 (*)    Creatinine, Ser 1.17 (*)    GFR calc non Af Amer 52 (*)    All other components within normal limits  CBC - Abnormal; Notable for the following components:   RDW 16.2 (*)    All other components within normal limits  I-STAT TROPONIN, ED  I-STAT BETA HCG BLOOD, ED ([facility], [facility], [facility] ONLY)  I-STAT TROPONIN, ED    EKG EKG Interpretation  Date/Time:  Friday June 26 2018 08:02:07 EDT Ventricular Rate:  82 PR Interval:    QRS Duration: 105 QT Interval:  395 QTC Calculation: 467 R Axis:   22 Text Interpretation:  Sinus rhythm Inferior infarct, age indeterminate Lateral leads are also involved similar to previous,  T wave inversions have been present on previous  tracings Confirmed by Theotis Burrow [phone]) on 06/26/2018 8:23:44 AM   Radiology Dg Chest 2 View  Result Date: 06/26/2018 CLINICAL DATA:  Right-sided chest pain and shortness-of-breath with fatigue and headache since this morning. EXAM: CHEST - 2 VIEW COMPARISON:  06/07/2018 FINDINGS: Lungs are adequately inflated and otherwise clear. Cardiomediastinal silhouette and remainder the exam is unchanged. IMPRESSION: No active cardiopulmonary disease. Electronically Signed   By: Marin Olp M.D.   On: 06/26/2018 08:44    Procedures Procedures (including critical care time)  Medications Ordered in ED Medications  fentaNYL (SUBLIMAZE) injection 50 mcg (50 mcg Intravenous Given 06/26/18 1003)     Initial Impression / Assessment and Plan / ED Course  I have reviewed the triage vital signs and the nursing notes.  Pertinent labs & imaging results that were available during my care of the patient were reviewed by me and considered in my medical decision making (see chart for details).     EKG similar to previous with stable T wave inversions from prior. Pt unable to take aspirin. Initial trop normal.   Labs reassuring w/ negative serial trops, Cr. 1.17.   I reviewed her chart including recent hospitalization for the same pain.  It appears that last time she presented she had also used cocaine recently.  They felt that her pain was likely due to vasospasm as she had a clean catheterization earlier this year.  They recommended medical management and cessation of cocaine.  I had a long discussion with patient explaining that her chest pain is directly related to her cocaine use and she may eventually have permanent damage to her heart if she continues drug use. Pt comfortable on reassessment and voiced understanding of plan need for medication compliance, cocaine cessation.  Return precautions reviewed.  Final Clinical Impressions(s) / ED Diagnoses   Final diagnoses:  Chest pain, unspecified type    Cocaine abuse [facility])    ED Discharge Orders    None       Little, Wenda Overland, MD 06/26/18 1411

## 2018-06-26 NOTE — ED Notes (Signed)
Pt stable, ambulatory, and verbalizes understanding of d/c instructions.  

## 2018-06-26 NOTE — Discharge Instructions (Addendum)
It is very likely that your chest pain is related to your cocaine use. STOP USING ALL COCAINE AND ILLICIT DRUGS. Follow up with your outpatient doctors.

## 2018-06-26 NOTE — ED Triage Notes (Signed)
Patient present to the ED from work  with complaints of right Chest pain that started at 0200. Describes as a pressure radiate to left shoulder. Patient took 1 nitro at 0200, then 2 within the hour with no improvement. Initial BP 72/50 500 cc NS given 110/74, No ASA given due to allergies. Patient reports: dizziness. Patient denies: Shortness of breathe

## 2018-08-10 ENCOUNTER — Emergency Department (HOSPITAL_COMMUNITY): Payer: Self-pay

## 2018-08-10 ENCOUNTER — Other Ambulatory Visit: Payer: Self-pay

## 2018-08-10 ENCOUNTER — Emergency Department (HOSPITAL_COMMUNITY)
Admission: EM | Admit: 2018-08-10 | Discharge: 2018-08-10 | Disposition: A | Discharge status: Home / Self Care | Payer: Self-pay | Attending: Emergency Medicine | Admitting: Emergency Medicine

## 2018-08-10 ENCOUNTER — Encounter (HOSPITAL_COMMUNITY): Payer: Self-pay | Admitting: Emergency Medicine

## 2018-08-10 DIAGNOSIS — F17210 Nicotine dependence, cigarettes, uncomplicated: Secondary | ICD-10-CM | POA: Insufficient documentation

## 2018-08-10 DIAGNOSIS — I252 Old myocardial infarction: Secondary | ICD-10-CM | POA: Insufficient documentation

## 2018-08-10 DIAGNOSIS — R0789 Other chest pain: Secondary | ICD-10-CM | POA: Insufficient documentation

## 2018-08-10 DIAGNOSIS — J45909 Unspecified asthma, uncomplicated: Secondary | ICD-10-CM | POA: Insufficient documentation

## 2018-08-10 DIAGNOSIS — F1410 Cocaine abuse, uncomplicated: Secondary | ICD-10-CM | POA: Insufficient documentation

## 2018-08-10 DIAGNOSIS — Z79899 Other long term (current) drug therapy: Secondary | ICD-10-CM | POA: Insufficient documentation

## 2018-08-10 DIAGNOSIS — R109 Unspecified abdominal pain: Secondary | ICD-10-CM | POA: Insufficient documentation

## 2018-08-10 DIAGNOSIS — I10 Essential (primary) hypertension: Secondary | ICD-10-CM | POA: Insufficient documentation

## 2018-08-10 LAB — CBC
HCT: 42.4 % (ref 36.0–46.0)
Hemoglobin: 13.5 g/dL (ref 12.0–15.0)
MCH: 28.1 pg (ref 26.0–34.0)
MCHC: 31.8 g/dL (ref 30.0–36.0)
MCV: 88.1 fL (ref 78.0–100.0)
PLATELETS: 278 10*3/uL (ref 150–400)
RBC: 4.81 MIL/uL (ref 3.87–5.11)
RDW: 14.8 % (ref 11.5–15.5)
WBC: 8.3 10*3/uL (ref 4.0–10.5)

## 2018-08-10 LAB — URINALYSIS, ROUTINE W REFLEX MICROSCOPIC
BACTERIA UA: NONE SEEN
BILIRUBIN URINE: NEGATIVE
Glucose, UA: NEGATIVE mg/dL
Ketones, ur: NEGATIVE mg/dL
LEUKOCYTES UA: NEGATIVE
Nitrite: NEGATIVE
PROTEIN: 30 mg/dL — AB
SPECIFIC GRAVITY, URINE: 1.021 (ref 1.005–1.030)
pH: 5 (ref 5.0–8.0)

## 2018-08-10 LAB — BASIC METABOLIC PANEL
Anion gap: 8 (ref 5–15)
BUN: 10 mg/dL (ref 6–20)
CHLORIDE: 112 mmol/L — AB (ref 98–111)
CO2: 20 mmol/L — ABNORMAL LOW (ref 22–32)
CREATININE: 1.19 mg/dL — AB (ref 0.44–1.00)
Calcium: 9.5 mg/dL (ref 8.9–10.3)
GFR calc Af Amer: 59 mL/min — ABNORMAL LOW (ref >60)
GFR, EST NON AFRICAN AMERICAN: 51 mL/min — AB (ref >60)
Glucose, Bld: 113 mg/dL — ABNORMAL HIGH (ref 70–99)
POTASSIUM: 3.5 mmol/L (ref 3.5–5.1)
Sodium: 140 mmol/L (ref 135–145)

## 2018-08-10 LAB — RAPID URINE DRUG SCREEN, HOSP PERFORMED
Amphetamines: NOT DETECTED
Barbiturates: NOT DETECTED
Benzodiazepines: NOT DETECTED
COCAINE: POSITIVE — AB
OPIATES: NOT DETECTED
TETRAHYDROCANNABINOL: POSITIVE — AB

## 2018-08-10 LAB — I-STAT TROPONIN, ED
Troponin i, poc: 0 ng/mL (ref 0.00–0.08)
Troponin i, poc: 0 ng/mL (ref 0.00–0.08)

## 2018-08-10 LAB — I-STAT BETA HCG BLOOD, ED (MC, WL, AP ONLY)

## 2018-08-10 MED ORDER — LORAZEPAM 1 MG PO TABS
1.0000 mg | ORAL_TABLET | Freq: Once | ORAL | Status: AC
  Administered 2018-08-10: 1 mg via ORAL
  Filled 2018-08-10: qty 1

## 2018-08-10 NOTE — ED Notes (Signed)
ED Provider at bedside. 

## 2018-08-10 NOTE — ED Provider Notes (Signed)
[facility]ER[facility]ENC[facility] DEPARTMENT Provider Note   CSN: [id number] Arrival date & time: 08/10/18  1019     History   Chief Complaint Chief Complaint  Patient presents with  . Chest Pain  . Flank Pain    HPI Denise Knight is a 52 y.o. female.  The history is provided by the patient.  Chest Pain   This is a recurrent problem. The current episode started yesterday. The problem occurs constantly. The problem has been gradually improving. Associated with: cocaine use. The pain is present in the substernal region and lateral region. The pain is at a severity of 3/10. The pain is moderate. The quality of the pain is described as pressure-like. The pain does not radiate. Associated symptoms include abdominal pain (left flank pain, hx of kidney stones). Pertinent negatives include no back pain, no cough, no diaphoresis, no exertional chest pressure, no fever, no headaches, no nausea, no palpitations, no shortness of breath, no syncope and no vomiting. She has tried nitroglycerin for the symptoms. The treatment provided no relief.  Her past medical history is significant for CAD (vasospasm, secondary to cocaine use in the past).  Pertinent negatives for past medical history include no seizures.  Procedure history is positive for cardiac catheterization.    Past Medical History:  Diagnosis Date  . Anxiety   . Arthritis    "left knee" (07/05/2016)  . Asthma   . Bipolar 1 disorder ([place])   . Depression   . GERD (gastroesophageal reflux disease)   . Hypertension   . MI (mitral incompetence)   . Post traumatic stress disorder   . Schizophrenia ([place])   . Stomach ulcer     Patient Active Problem List   Diagnosis Date Noted  . Cocaine abuse ([place]) 12/27/2017  . Rectal bleeding 12/27/2017  . NSTEMI (non-ST elevated myocardial infarction) ([place]) 12/23/2017  . Acute viral bronchitis 06/14/2017  . Asthma exacerbation 06/14/2017  . Chest pain 07/05/2016    Past  Surgical History:  Procedure Laterality Date  . CARDIAC CATHETERIZATION  07/05/2016  . CARDIAC CATHETERIZATION N/A 07/05/2016   Procedure: Left Heart Cath and Coronary Angiography;  Surgeon: Adrian Prows, MD;  Location: [facility];  Service: Cardiovascular;  Laterality: N/A;  . CYST EXCISION Right    "wrist"  . DILATION AND CURETTAGE OF UTERUS    . FOOT SURGERY Bilateral    "corns removed"  . LEFT HEART CATH AND CORONARY ANGIOGRAPHY N/A 12/24/2017   Procedure: LEFT HEART CATH AND CORONARY ANGIOGRAPHY;  Surgeon: Nigel Mormon, MD;  Location: [facility];  Service: Cardiovascular;  Laterality: N/A;  . TUBAL LIGATION       OB History   None      Home Medications    Prior to Admission medications   Medication Sig Start Date End Date Taking? Authorizing Provider  albuterol (PROVENTIL HFA;VENTOLIN HFA) 108 (90 BASE) MCG/ACT inhaler Inhale 2 puffs into the lungs every 6 (six) hours as needed for wheezing or shortness of breath.    [provider]  amLODipine (NORVASC) 5 MG tablet Take 1 tablet (5 mg total) by mouth daily. 06/10/18   Geradine Girt, DO  loratadine (CLARITIN) 10 MG tablet Take 1 tablet (10 mg total) by mouth daily. Patient not taking: Reported on 06/26/2018 06/16/17   Thomasene Ripple, MD  nitroGLYCERIN (NITROSTAT) 0.4 MG SL tablet Place 1 tablet (0.4 mg total) under the tongue every 5 (five) minutes x 3 doses as needed for  chest pain. 02/11/18   Law, Bea Graff, PA-C  omeprazole (PRILOSEC) 20 MG capsule Take 20 mg by mouth daily.     [provider]    Family History Family History  Problem Relation Age of Onset  . Breast cancer Unknown   . Lung cancer Unknown   . Congestive Heart Failure Unknown   . Diabetes Unknown   . Hypertension Unknown     Social History Social History   Tobacco Use  . Smoking status: Current Some Day Smoker    Packs/day: 1.00    Years: 35.00    Pack years: 35.00    Types: Cigarettes  . Smokeless  tobacco: Never Used  Substance Use Topics  . Alcohol use: Yes    Alcohol/week: 7.0 standard drinks    Types: 7 Cans of beer per week  . Drug use: Yes    Types: Cocaine     Allergies   Aspirin and Food   Review of Systems Review of Systems  Constitutional: Negative for chills, diaphoresis and fever.  HENT: Negative for ear pain and sore throat.   Eyes: Negative for pain and visual disturbance.  Respiratory: Negative for cough and shortness of breath.   Cardiovascular: Positive for chest pain. Negative for palpitations and syncope.  Gastrointestinal: Positive for abdominal pain (left flank pain, hx of kidney stones). Negative for nausea and vomiting.  Genitourinary: Negative for dysuria and hematuria.  Musculoskeletal: Negative for arthralgias and back pain.  Skin: Negative for color change and rash.  Neurological: Negative for seizures, syncope and headaches.  All other systems reviewed and are negative.    Physical Exam Updated Vital Signs  ED Triage Vitals  Enc Vitals Group     BP 08/10/18 1033 126/90     Pulse Rate 08/10/18 1033 83     Resp 08/10/18 1033 (!) 21     Temp 08/10/18 1033 98.7 F (37.1 C)     Temp Source 08/10/18 1033 Oral     SpO2 08/10/18 1033 98 %     Weight --      Height 08/10/18 1029 5' 8.5" (1.74 m)     Head Circumference --      Peak Flow --      Pain Score 08/10/18 1029 10     Pain Loc --      Pain Edu? --      Excl. in [place]? --     Physical Exam  Constitutional: She is oriented to person, place, and time. She appears well-developed and well-nourished. No distress.  HENT:  Head: Normocephalic and atraumatic.  Eyes: Pupils are equal, round, and reactive to light. Conjunctivae and EOM are normal.  Neck: Normal range of motion. Neck supple.  Cardiovascular: Normal rate, regular rhythm, intact distal pulses and normal pulses.  No murmur heard. Pulmonary/Chest: Effort normal and breath sounds normal. No respiratory distress. She has no  decreased breath sounds.  Abdominal: Soft. Bowel sounds are normal. There is no tenderness.  Musculoskeletal: She exhibits no edema.       Right lower leg: She exhibits no edema.       Left lower leg: She exhibits no edema.  TTP in left CVA  Neurological: She is alert and oriented to person, place, and time.  Skin: Skin is warm and dry. Capillary refill takes less than 2 seconds.  Psychiatric: She has a normal mood and affect.  Nursing note and vitals reviewed.    ED Treatments / Results  Labs (all labs ordered  are listed, but only abnormal results are displayed) Labs Reviewed  BASIC METABOLIC PANEL - Abnormal; Notable for the following components:      Result Value   Chloride 112 (*)    CO2 20 (*)    Glucose, Bld 113 (*)    Creatinine, Ser 1.19 (*)    GFR calc non Af Amer 51 (*)    GFR calc Af Amer 59 (*)    All other components within normal limits  URINALYSIS, ROUTINE W REFLEX MICROSCOPIC - Abnormal; Notable for the following components:   APPearance HAZY (*)    Hgb urine dipstick MODERATE (*)    Protein, ur 30 (*)    All other components within normal limits  RAPID URINE DRUG SCREEN, HOSP PERFORMED - Abnormal; Notable for the following components:   Cocaine POSITIVE (*)    Tetrahydrocannabinol POSITIVE (*)    All other components within normal limits  CBC  I-STAT TROPONIN, ED  I-STAT BETA HCG BLOOD, ED ([facility], [facility], [facility] ONLY)  I-STAT TROPONIN, ED    EKG EKG Interpretation  Date/Time:  Monday August 10 2018 10:27:50 EDT Ventricular Rate:  92 PR Interval:  144 QRS Duration: 64 QT Interval:  368 QTC Calculation: 455 R Axis:   37 Text Interpretation:  Normal sinus rhythm Possible Inferior infarct , age undetermined Abnormal ECG No significant change since last tracing Confirmed by Lennice Sites [phone]) on 08/10/2018 12:07:35 PM   Radiology Dg Chest 2 View  Result Date: 08/10/2018 CLINICAL DATA:  Chest pain with cough and congestion EXAM: CHEST - 2 VIEW  COMPARISON:  June 26, 2018 FINDINGS: Lungs are clear. Heart size and pulmonary vascularity are normal. No adenopathy. No bone lesions. No pneumothorax. IMPRESSION: No edema or consolidation. Electronically Signed   By: Lowella Grip III M.D.   On: 08/10/2018 11:11   Ct Renal Stone Study  Result Date: 08/10/2018 CLINICAL DATA:  Ct a/p wo, Pt with chest pain and left flank pain since 0200. She reports taking a NTG and then going back to sleep. Pain has returned pt is clutching chest. She is nauseated. Hx of MI and cardiac muscle spasms. EXAM: CT ABDOMEN AND PELVIS WITHOUT CONTRAST TECHNIQUE: Multidetector CT imaging of the abdomen and pelvis was performed following the standard protocol without IV contrast. COMPARISON:  CT of the abdomen and pelvis on 06/01/2018 FINDINGS: Lower chest: No acute abnormality. Hepatobiliary: Gallbladder is present.  The liver is homogeneous. Pancreas: Unremarkable. No pancreatic ductal dilatation or surrounding inflammatory changes. Spleen: Normal in size without focal abnormality. Adrenals/Urinary Tract: The adrenal glands are normal in appearance. Kidneys are normal in appearance. No intrarenal or ureteral calculi. The bladder and visualized portion of the urethra are normal. Stomach/Bowel: The stomach is normal in appearance. Small bowel loops are normal in appearance. The appendix is well seen and has a normal appearance. Radiopaque tablets are identified within the colon. There is a significant stool burden. Vascular/Lymphatic: No significant vascular findings are present. No enlarged abdominal or pelvic lymph nodes. Reproductive: Uterus is enlarged. A coarsely calcified fibroid is identified along the LEFT aspect of the uterus. No adnexal mass. Other: No free pelvic fluid. Small fat containing paraumbilical hernia. Musculoskeletal: Moderate degenerative changes in both hips. No acute osseous abnormality. IMPRESSION: 1. No acute abnormality of the abdomen or pelvis. 2.  Significant stool burden. 3. No urinary tract obstruction. 4. Normal appendix. 5. Calcified and noncalcified uterine fibroids. 6. Degenerative changes in both hips. Electronically Signed   By: Nolon Nations M.D.  On: 08/10/2018 15:41    Procedures Procedures (including critical care time)  Medications Ordered in ED Medications  LORazepam (ATIVAN) tablet 1 mg (1 mg Oral Given 08/10/18 1328)     Initial Impression / Assessment and Plan / ED Course  I have reviewed the triage vital signs and the nursing notes.  Pertinent labs & imaging results that were available during my care of the patient were reviewed by me and considered in my medical decision making (see chart for details).     Denise Knight is a 52 year old female history of bipolar, schizophrenia, hypertension, cocaine abuse who presents to the ED with chest pain, left-sided flank pain.  Patient with unremarkable vitals.  No fever.  Patient states that chest pain developed shortly after using cocaine.  EKG upon arrival is unremarkable.  No significant change from prior.  Patient has had some left flank pain has a history of kidney stones.  Denies any nausea, vomiting, diarrhea.  She does not have any shortness of breath.  She does not have any DVT or PE risk factors.  Doubt PE.  Suspect vasospasm from cocaine abuse.  Patient with no significant anemia, electrolyte abnormality, kidney injury.  Urinalysis unremarkable.  CT scan of abdomen pelvis did not show any acute findings, no kidney stones.  Drug screen positive for cocaine and marijuana.  Chest x-ray showed no signs of pneumonia, no pneumothorax, no pleural effusion.  Patient with 2 troponins within normal limits.  Patient was given Ativan and had relief of symptoms.  Remained chest pain-free throughout my care.  Counseled on cocaine use and abuse. Suspect pain from cocaine. Discharged from ED in good condition and told her return to the ED if symptoms worsen.  This chart was  dictated using voice recognition software.  Despite best efforts to proofread,  errors can occur which can change the documentation meaning.   Final Clinical Impressions(s) / ED Diagnoses   Final diagnoses:  Atypical chest pain    ED Discharge Orders    None       Lennice Sites, DO 08/10/18 1721

## 2018-08-10 NOTE — ED Notes (Signed)
Pt given [place] sandwich and ginger ale per [employer].Marland Kitchen

## 2018-08-10 NOTE — ED Triage Notes (Signed)
Pt with chest pain and left flank pain since 0200. She reports taking a NTG and then going back to sleep. Pain has returned pt is clutching chest.  She is nauseated. Hx of MI and cardiac muscle spasms. Pt is alert, speaking full sentences.

## 2018-08-10 NOTE — ED Notes (Signed)
Pt requesting food and drink at this time.

## 2018-09-18 ENCOUNTER — Emergency Department (HOSPITAL_COMMUNITY)
Admission: EM | Admit: 2018-09-18 | Discharge: 2018-09-18 | Disposition: A | Discharge status: Home / Self Care | Payer: Self-pay | Attending: Emergency Medicine | Admitting: Emergency Medicine

## 2018-09-18 ENCOUNTER — Other Ambulatory Visit: Payer: Self-pay

## 2018-09-18 ENCOUNTER — Encounter (HOSPITAL_COMMUNITY): Payer: Self-pay

## 2018-09-18 ENCOUNTER — Emergency Department (HOSPITAL_COMMUNITY): Payer: Self-pay

## 2018-09-18 DIAGNOSIS — K922 Gastrointestinal hemorrhage, unspecified: Secondary | ICD-10-CM

## 2018-09-18 DIAGNOSIS — Z79899 Other long term (current) drug therapy: Secondary | ICD-10-CM | POA: Insufficient documentation

## 2018-09-18 DIAGNOSIS — J45909 Unspecified asthma, uncomplicated: Secondary | ICD-10-CM | POA: Insufficient documentation

## 2018-09-18 DIAGNOSIS — K529 Noninfective gastroenteritis and colitis, unspecified: Secondary | ICD-10-CM

## 2018-09-18 DIAGNOSIS — F17210 Nicotine dependence, cigarettes, uncomplicated: Secondary | ICD-10-CM | POA: Insufficient documentation

## 2018-09-18 DIAGNOSIS — I10 Essential (primary) hypertension: Secondary | ICD-10-CM | POA: Insufficient documentation

## 2018-09-18 LAB — COMPREHENSIVE METABOLIC PANEL
ALT: 15 U/L (ref 0–44)
ANION GAP: 6 (ref 5–15)
AST: 21 U/L (ref 15–41)
Albumin: 3.4 g/dL — ABNORMAL LOW (ref 3.5–5.0)
Alkaline Phosphatase: 53 U/L (ref 38–126)
BILIRUBIN TOTAL: 0.3 mg/dL (ref 0.3–1.2)
BUN: 17 mg/dL (ref 6–20)
CO2: 24 mmol/L (ref 22–32)
Calcium: 9.2 mg/dL (ref 8.9–10.3)
Chloride: 110 mmol/L (ref 98–111)
Creatinine, Ser: 1.22 mg/dL — ABNORMAL HIGH (ref 0.44–1.00)
GFR calc Af Amer: 58 mL/min — ABNORMAL LOW (ref >60)
GFR, EST NON AFRICAN AMERICAN: 50 mL/min — AB (ref >60)
Glucose, Bld: 100 mg/dL — ABNORMAL HIGH (ref 70–99)
Potassium: 3.8 mmol/L (ref 3.5–5.1)
Sodium: 140 mmol/L (ref 135–145)
TOTAL PROTEIN: 6.7 g/dL (ref 6.5–8.1)

## 2018-09-18 LAB — CBC
HCT: 40.7 % (ref 36.0–46.0)
HEMOGLOBIN: 12.8 g/dL (ref 12.0–15.0)
MCH: 27.6 pg (ref 26.0–34.0)
MCHC: 31.4 g/dL (ref 30.0–36.0)
MCV: 87.9 fL (ref 80.0–100.0)
NRBC: 0 % (ref 0.0–0.2)
Platelets: 245 10*3/uL (ref 150–400)
RBC: 4.63 MIL/uL (ref 3.87–5.11)
RDW: 15.4 % (ref 11.5–15.5)
WBC: 8.1 10*3/uL (ref 4.0–10.5)

## 2018-09-18 LAB — POC OCCULT BLOOD, ED: Fecal Occult Bld: POSITIVE — AB

## 2018-09-18 LAB — TYPE AND SCREEN
ABO/RH(D): O NEG
ANTIBODY SCREEN: NEGATIVE

## 2018-09-18 IMAGING — CT CT ABD-PELV W/ CM
2 of 5 series · 16 of 46 positions shown, 18 images · IV contrast (APPLIED)
Comparison: [DATE] CT abdomen and pelvis.

CLINICAL DATA: 51 y/o  F; three days of rectal bleeding.

EXAM:
CT ABDOMEN AND PELVIS WITH CONTRAST
TECHNIQUE: Multidetector CT imaging of the abdomen and pelvis was performed
using the standard protocol following bolus administration of
intravenous contrast.
CONTRAST:  100mL OMNIPAQUE IOHEXOL 300 MG/ML  SOLN

[Series 3: abdomen 5.0 · axial · 0.71mm/px · z∈[+872,+1268]mm · 13 of 93 slices shown, 15 images]
[im 7/93  soft-tissue]
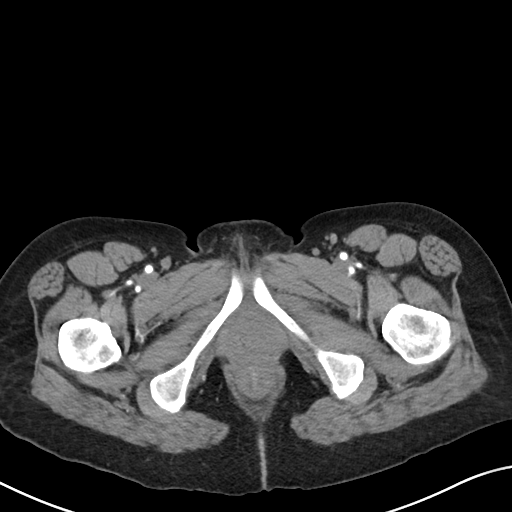
[im 7/93  bone]
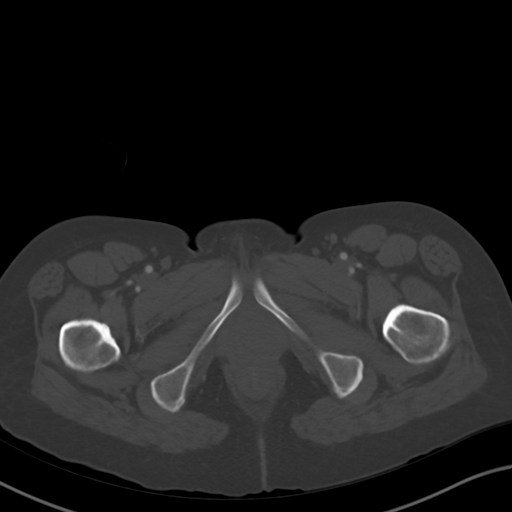
[im 14/93  soft-tissue]
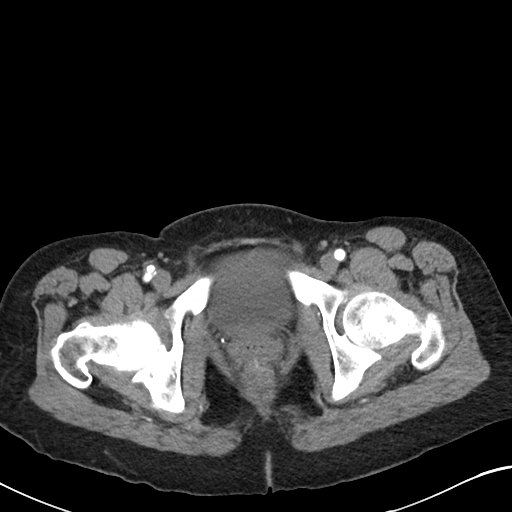
[im 20/93  soft-tissue]
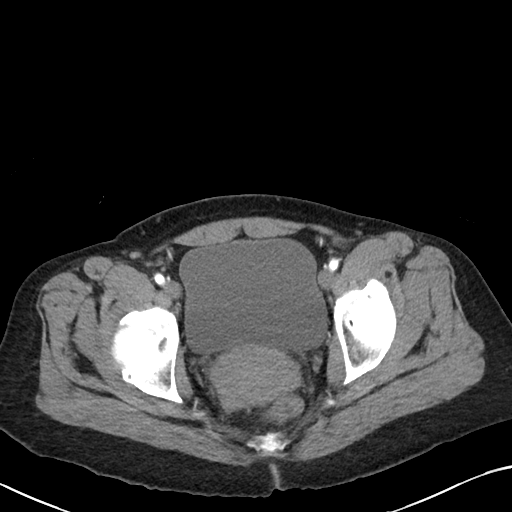
[im 27/93  soft-tissue]
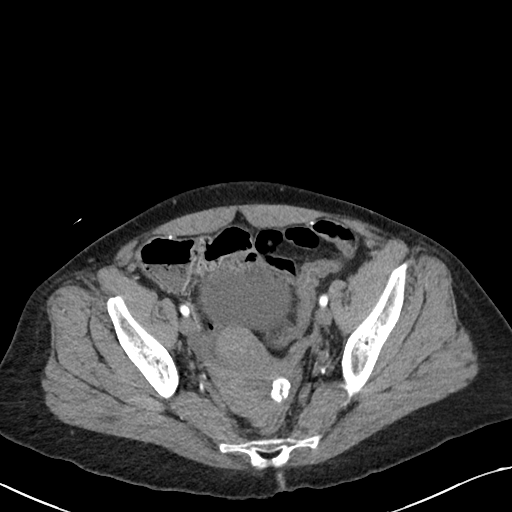
[im 33/93  soft-tissue]
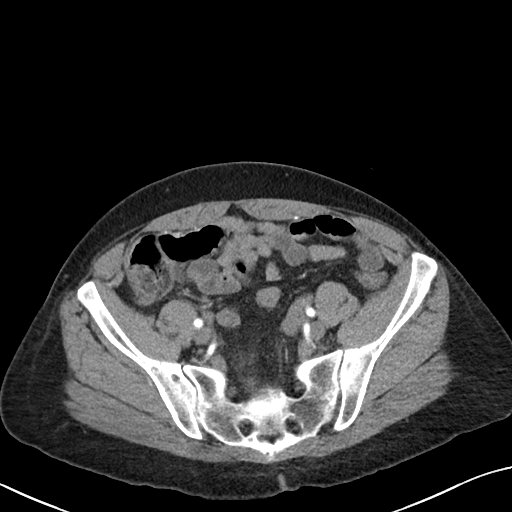
[im 40/93  soft-tissue]
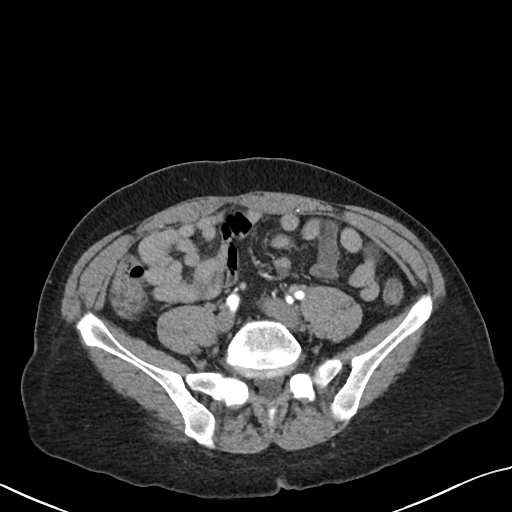
[im 47/93  soft-tissue]
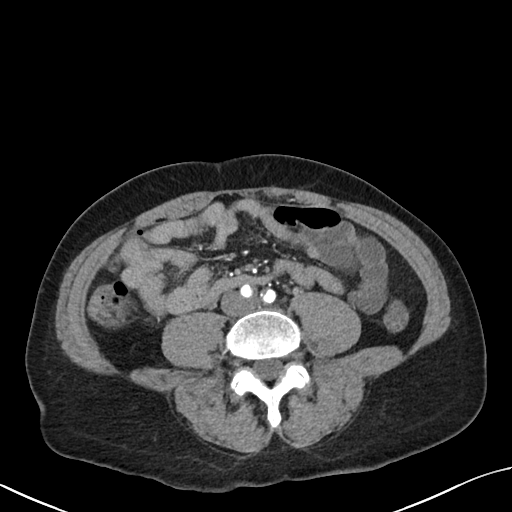
[im 53/93  soft-tissue]
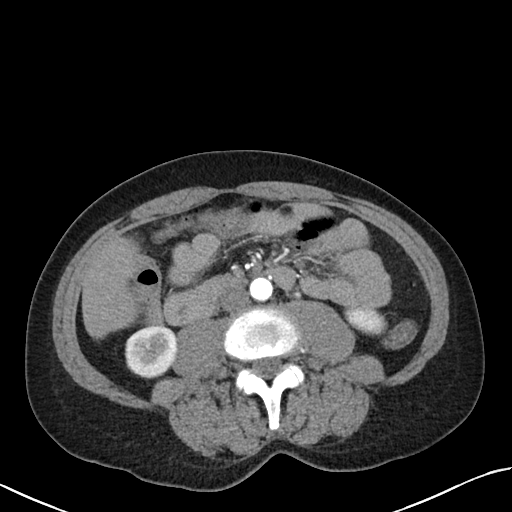
[im 60/93  soft-tissue]
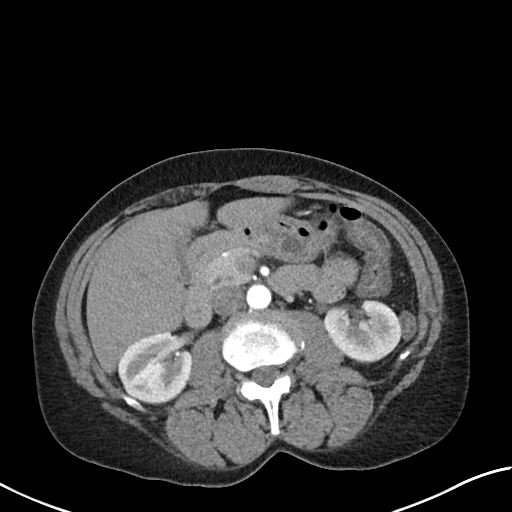
[im 60/93  bone]
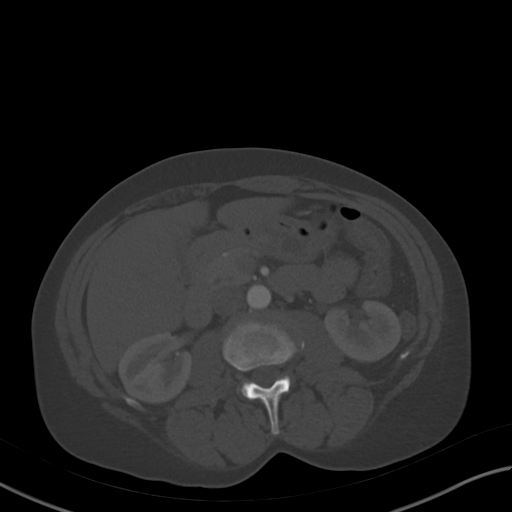
[im 66/93  soft-tissue]
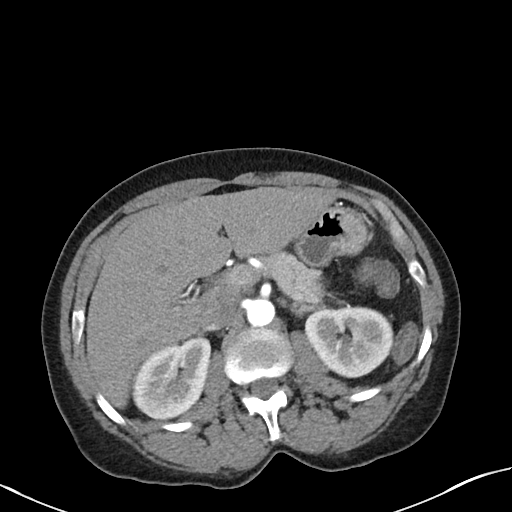
[im 73/93  soft-tissue]
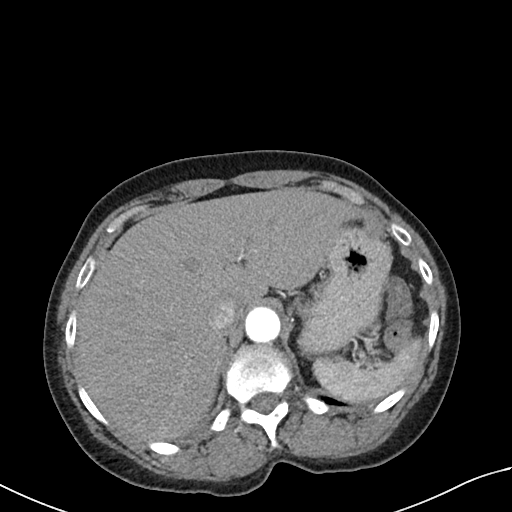
[im 79/93  soft-tissue]
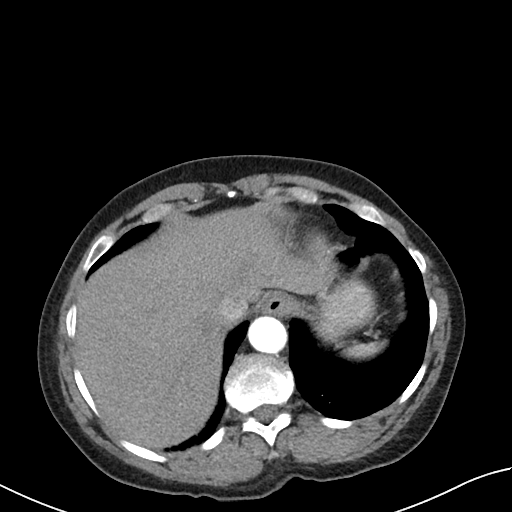
[im 86/93  soft-tissue]
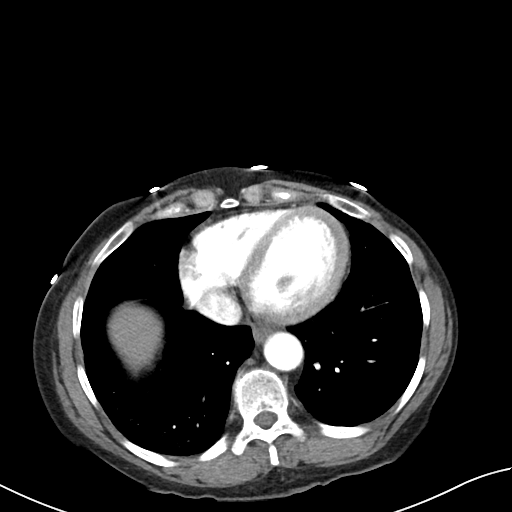

[Series 6: abdomen 3.0 mpr cor · coronal · 0.66mm/px · 3 of 76 slices shown]
[im 26/76  soft-tissue]
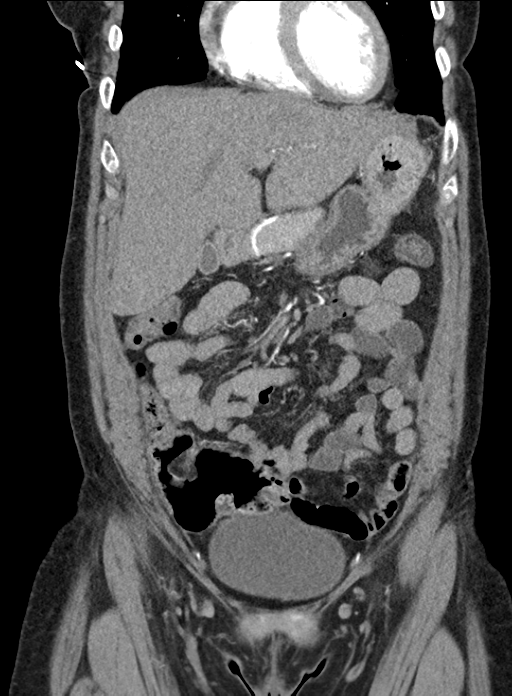
[im 34/76  soft-tissue]
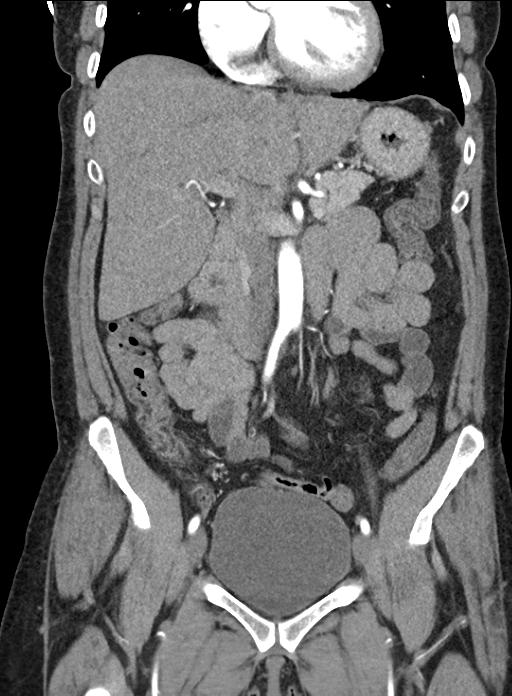
[im 42/76  soft-tissue]
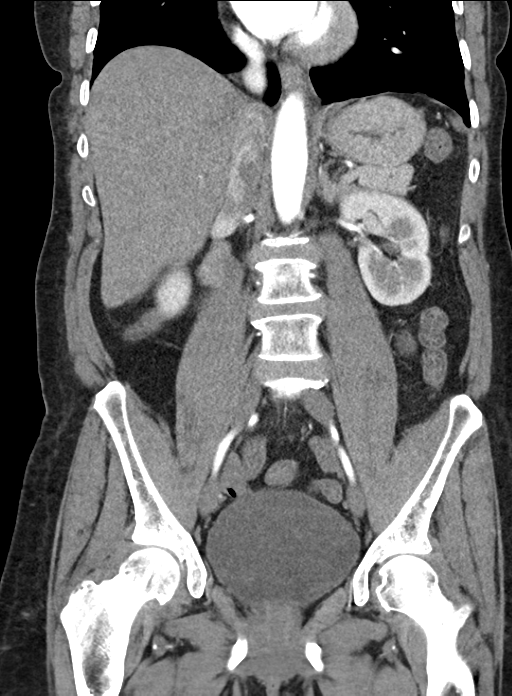

[16 of 46 positions shown; findings below may reference images not displayed]

FINDINGS: Lower chest: No acute abnormality.

Hepatobiliary: 7 mm segment 5 hyperdense focus, not visible on the
delayed phase, likely flash hemangioma or transient hepatic
attenuation difference (series 3, image 39). No additional focal
liver abnormality is seen. No gallstones, gallbladder wall
thickening, or biliary dilatation.

Pancreas: Unremarkable. No pancreatic ductal dilatation or
surrounding inflammatory changes.

Spleen: Normal in size without focal abnormality.

Adrenals/Urinary Tract: Adrenal glands are unremarkable. Kidneys are
normal, without renal calculi, focal lesion, or hydronephrosis.
Bladder is unremarkable.

Stomach/Bowel: Stomach is within normal limits. Appendix appears
normal. No obstructive or inflammatory changes of the small bowel.
Mild diffuse low-attenuation wall thickening of the large bowel.

Vascular/Lymphatic: No significant vascular findings are present. No
enlarged abdominal or pelvic lymph nodes.

Reproductive: Stable fibroid uterus. No adnexal mass.

Other: Small fat containing paraumbilical hernia is stable.

Musculoskeletal: No fracture is seen. Stable osteoarthrosis of the
hip joints.
IMPRESSION: 1. Mild wall thickening of colon may reflect colitis.
2. Stable small fat containing paraumbilical hernia.
3. Stable uterine fibroids.

## 2018-09-18 MED ORDER — HYDROCORTISONE ACETATE 25 MG RE SUPP: 25.0000 mg | Freq: Two times a day (BID) | RECTAL | 0 refills | Status: DC

## 2018-09-18 MED ORDER — SODIUM CHLORIDE 0.9 % IV BOLUS
1000.0000 mL | Freq: Once | INTRAVENOUS | Status: AC
  Administered 2018-09-18: 1000 mL via INTRAVENOUS

## 2018-09-18 MED ORDER — CIPROFLOXACIN HCL 500 MG PO TABS: 500.0000 mg | ORAL_TABLET | Freq: Two times a day (BID) | ORAL | 0 refills | Status: DC

## 2018-09-18 MED ORDER — MORPHINE SULFATE (PF) 4 MG/ML IV SOLN
4.0000 mg | Freq: Once | INTRAVENOUS | Status: AC
  Administered 2018-09-18: 4 mg via INTRAVENOUS
  Filled 2018-09-18: qty 1

## 2018-09-18 MED ORDER — ONDANSETRON HCL 4 MG/2ML IJ SOLN
4.0000 mg | Freq: Once | INTRAMUSCULAR | Status: AC
  Administered 2018-09-18: 4 mg via INTRAVENOUS
  Filled 2018-09-18: qty 2

## 2018-09-18 MED ORDER — IOHEXOL 300 MG/ML  SOLN
100.0000 mL | Freq: Once | INTRAMUSCULAR | Status: AC | PRN
  Administered 2018-09-18: 100 mL via INTRAVENOUS

## 2018-09-18 MED ORDER — METRONIDAZOLE 500 MG PO TABS: 500.0000 mg | ORAL_TABLET | Freq: Two times a day (BID) | ORAL | 0 refills | Status: DC

## 2018-09-18 NOTE — ED Notes (Signed)
Pt taken to CT.

## 2018-09-18 NOTE — ED Triage Notes (Signed)
Pt endorses passing blood through stool x 3 days, this morning pt has had 8 episodes of passing large amounts of blood without stool on the toilet. Thinks she may have hematuria as well. VSS. Axox4. Denies n/v.

## 2018-09-18 NOTE — ED Notes (Signed)
Pt verbalized understanding of d/c instructions and has no further questions, pt to follow up with pcp and GI specialist in the next week. VSS, NAD.

## 2018-09-18 NOTE — ED Provider Notes (Signed)
[facility] EMERGENCY DEPARTMENT Provider Note   CSN: [id number] Arrival date & time: 09/18/18  9470     History   Chief Complaint Chief Complaint  Patient presents with  . Abdominal Pain  . Rectal Bleeding    HPI Denise Knight is a 52 y.o. female.  The history is provided by the patient and medical records. No language interpreter was used.  Abdominal Pain   Associated symptoms include hematochezia.  Rectal Bleeding  Associated symptoms: abdominal pain      52 year old female with history of schizophrenia, polysubstance abuse, bipolar, GERD, stomach ulcer, recurrent rectal bleeding presenting for evaluation of rectal bleeding.  Patient reports 3 days ago she noticed some dark stools when she had a bowel movement, the next day the stool was more maroon color but today she has had 8 episodes of bright red blood per rectum.  She also endorsed having abdominal pain described as a cramping sensation to her periumbilical region.  Pain is moderate in severity.  She endorsed some lightheadedness.  She denies any associated fever, chills, chest pain, shortness of breath, productive cough, dysuria, hematuria, vaginal bleeding, rectal pain or rectal itchiness.  She denies any recent constipation.  She denies taking NSAIDs recently.  She mentioned having prior rectal bleeding the past and subsequently had a colonoscopy that showed some benign polyps.  She denies any recent alcohol use.  Past Medical History:  Diagnosis Date  . Anxiety   . Arthritis    "left knee" (07/05/2016)  . Asthma   . Bipolar 1 disorder ([place])   . Depression   . GERD (gastroesophageal reflux disease)   . Hypertension   . MI (mitral incompetence)   . Post traumatic stress disorder   . Schizophrenia ([place])   . Stomach ulcer     Patient Active Problem List   Diagnosis Date Noted  . Cocaine abuse ([place]) 12/27/2017  . Rectal bleeding 12/27/2017  . NSTEMI (non-ST elevated myocardial  infarction) ([place]) 12/23/2017  . Acute viral bronchitis 06/14/2017  . Asthma exacerbation 06/14/2017  . Chest pain 07/05/2016    Past Surgical History:  Procedure Laterality Date  . CARDIAC CATHETERIZATION  07/05/2016  . CARDIAC CATHETERIZATION N/A 07/05/2016   Procedure: Left Heart Cath and Coronary Angiography;  Surgeon: Adrian Prows, MD;  Location: [facility];  Service: Cardiovascular;  Laterality: N/A;  . CYST EXCISION Right    "wrist"  . DILATION AND CURETTAGE OF UTERUS    . FOOT SURGERY Bilateral    "corns removed"  . LEFT HEART CATH AND CORONARY ANGIOGRAPHY N/A 12/24/2017   Procedure: LEFT HEART CATH AND CORONARY ANGIOGRAPHY;  Surgeon: Nigel Mormon, MD;  Location: [facility];  Service: Cardiovascular;  Laterality: N/A;  . TUBAL LIGATION       OB History   None      Home Medications    Prior to Admission medications   Medication Sig Start Date End Date Taking? Authorizing Provider  albuterol (PROVENTIL HFA;VENTOLIN HFA) 108 (90 BASE) MCG/ACT inhaler Inhale 2 puffs into the lungs every 6 (six) hours as needed for wheezing or shortness of breath.    [provider]  amLODipine (NORVASC) 5 MG tablet Take 1 tablet (5 mg total) by mouth daily. 06/10/18   Geradine Girt, DO  loratadine (CLARITIN) 10 MG tablet Take 1 tablet (10 mg total) by mouth daily. Patient not taking: Reported on 06/26/2018 06/16/17   Thomasene Ripple, MD  nitroGLYCERIN (NITROSTAT) 0.4 MG SL  tablet Place 1 tablet (0.4 mg total) under the tongue every 5 (five) minutes x 3 doses as needed for chest pain. 02/11/18   Law, Bea Graff, PA-C  omeprazole (PRILOSEC) 20 MG capsule Take 20 mg by mouth daily.     [provider]    Family History Family History  Problem Relation Age of Onset  . Breast cancer Unknown   . Lung cancer Unknown   . Congestive Heart Failure Unknown   . Diabetes Unknown   . Hypertension Unknown     Social History Social History   Tobacco Use  .  Smoking status: Current Some Day Smoker    Packs/day: 1.00    Years: 35.00    Pack years: 35.00    Types: Cigarettes  . Smokeless tobacco: Never Used  Substance Use Topics  . Alcohol use: Yes    Alcohol/week: 7.0 standard drinks    Types: 7 Cans of beer per week  . Drug use: Yes    Types: Cocaine     Allergies   Aspirin and Food   Review of Systems Review of Systems  Gastrointestinal: Positive for abdominal pain and hematochezia.  All other systems reviewed and are negative.    Physical Exam Updated Vital Signs BP (!) 152/105 (BP Location: Right Arm)   Pulse 68   Temp 98 F (36.7 C) (Oral)   Resp 16   Ht 5' 8.5" (1.74 m)   Wt 74.8 kg   SpO2 100%   BMI 24.72 kg/m   Physical Exam  Constitutional: She appears well-developed and well-nourished. No distress.  HENT:  Head: Atraumatic.  Eyes: Conjunctivae are normal.  Neck: Neck supple.  Cardiovascular: Normal rate and regular rhythm.  Pulmonary/Chest: Effort normal and breath sounds normal.  Abdominal: Soft. Normal appearance. There is tenderness in the periumbilical area. There is no rebound and no guarding.  Genitourinary:  Genitourinary Comments: Chaperone present during exam.  Frank bright red blood per rectum, rectal tone intact, no obvious mass, no anal fissure, thrombosed hemorrhoid, or perirectal abscess noted on initial exam.  Discomfort with digital rectal exam.  No stool impaction.  Neurological: She is alert.  Skin: No rash noted.  Psychiatric: She has a normal mood and affect.  Nursing note and vitals reviewed.    ED Treatments / Results  Labs (all labs ordered are listed, but only abnormal results are displayed) Labs Reviewed  COMPREHENSIVE METABOLIC PANEL - Abnormal; Notable for the following components:      Result Value   Glucose, Bld 100 (*)    Creatinine, Ser 1.22 (*)    Albumin 3.4 (*)    GFR calc non Af Amer 50 (*)    GFR calc Af Amer 58 (*)    All other components within normal  limits  POC OCCULT BLOOD, ED - Abnormal; Notable for the following components:   Fecal Occult Bld POSITIVE (*)    All other components within normal limits  CBC  TYPE AND SCREEN    EKG None  Radiology Ct Abdomen Pelvis W Contrast  Result Date: 09/18/2018 CLINICAL DATA:  52 y/o  F; three days of rectal bleeding. EXAM: CT ABDOMEN AND PELVIS WITH CONTRAST TECHNIQUE: Multidetector CT imaging of the abdomen and pelvis was performed using the standard protocol following bolus administration of intravenous contrast. CONTRAST:  135m OMNIPAQUE IOHEXOL 300 MG/ML  SOLN COMPARISON:  08/10/2018 CT abdomen and pelvis. FINDINGS: Lower chest: No acute abnormality. Hepatobiliary: 7 mm segment 5 hyperdense focus, not visible on the  delayed phase, likely flash hemangioma or transient hepatic attenuation difference (series 3, image 39). No additional focal liver abnormality is seen. No gallstones, gallbladder wall thickening, or biliary dilatation. Pancreas: Unremarkable. No pancreatic ductal dilatation or surrounding inflammatory changes. Spleen: Normal in size without focal abnormality. Adrenals/Urinary Tract: Adrenal glands are unremarkable. Kidneys are normal, without renal calculi, focal lesion, or hydronephrosis. Bladder is unremarkable. Stomach/Bowel: Stomach is within normal limits. Appendix appears normal. No obstructive or inflammatory changes of the small bowel. Mild diffuse low-attenuation wall thickening of the large bowel. Vascular/Lymphatic: No significant vascular findings are present. No enlarged abdominal or pelvic lymph nodes. Reproductive: Stable fibroid uterus. No adnexal mass. Other: Small fat containing paraumbilical hernia is stable. Musculoskeletal: No fracture is seen. Stable osteoarthrosis of the hip joints. IMPRESSION: 1. Mild wall thickening of colon may reflect colitis. 2. Stable small fat containing paraumbilical hernia. 3. Stable uterine fibroids. Electronically Signed   By: Kristine Garbe M.D.   On: 09/18/2018 15:17    Procedures Procedures (including critical care time)  Medications Ordered in ED Medications  sodium chloride 0.9 % bolus 1,000 mL (0 mLs Intravenous Stopped 09/18/18 1101)  morphine 4 MG/ML injection 4 mg (4 mg Intravenous Given 09/18/18 1030)  ondansetron (ZOFRAN) injection 4 mg (4 mg Intravenous Given 09/18/18 1030)  iohexol (OMNIPAQUE) 300 MG/ML solution 100 mL (100 mLs Intravenous Contrast Given 09/18/18 1346)  morphine 4 MG/ML injection 4 mg (4 mg Intravenous Given 09/18/18 1515)     Initial Impression / Assessment and Plan / ED Course  I have reviewed the triage vital signs and the nursing notes.  Pertinent labs & imaging results that were available during my care of the patient were reviewed by me and considered in my medical decision making (see chart for details).     BP 140/85 (BP Location: Left Arm)   Pulse 74   Temp 98 F (36.7 C) (Oral)   Resp 16   Ht 5' 8.5" (1.74 m)   Wt 74.8 kg   SpO2 98%   BMI 24.72 kg/m    Final Clinical Impressions(s) / ED Diagnoses   Final diagnoses:  Colitis  Lower GI bleed    ED Discharge Orders         Ordered    ciprofloxacin (CIPRO) 500 MG tablet  2 times daily     09/18/18 1608    metroNIDAZOLE (FLAGYL) 500 MG tablet  2 times daily     09/18/18 1608    hydrocortisone (ANUSOL-HC) 25 MG suppository  2 times daily     09/18/18 1608         10:08 AM With complaints of GI bleeding for the past few days, increasing in frequency and severity.  She does have bright red blood per rectum.  Work-up initiated.  Will obtain abdominal pelvic CT scan due to her abdominal pain. Care discussed with Dr. Maryan Rued.   4:06 PM Creatinine is 1.22, IV fluid given, normal WBC, normal H&H, her hemoglobin is 12.8, her fecal blood test is positive.  An abdominal pelvic CT scan performed showing mild wall thickening of the colon which may reflect colitis.  No other concerning feature.  At this time,  her symptom is mostly controlled, vital signs are stable.  I discussed the finding with patient, she felt comfortable going home and will follow-up closely with her PCP and her GI specialist for further management.  Given finding of colitis, will prescribe Cipro and Flagyl antibiotic as well as Anusol as patient has history  of hemorrhoid past.  She understands that she can return promptly if her condition worsen.   Domenic Moras, PA-C 09/18/18 1611    Blanchie Dessert, MD 09/19/18 2328

## 2018-09-18 NOTE — ED Notes (Signed)
Pt now states that her pain is back to 10/10.

## 2018-09-18 NOTE — Discharge Instructions (Signed)
Please call and follow up with your doctor and with GI specialist for further evaluation and management of your rectal bleeding.  Take antibiotics as prescribed for colitis.  Use anusol for hemorrhoid.  Return if your condition worsen or if you have any concerns.

## 2018-09-18 NOTE — ED Notes (Signed)
Pt states that she used to be on blood thinners but had episodes of rectal bleeding in the past and had to have a blood transfusion and stopped taking thinners. Pt also states "I used to take BP medication but I stopped it because my friend takes lisinopril and my friend's throat closed up from it so now I just eat mustard and take a shot of vinegar and it works like a charm" Pt's BP 152/105.

## 2018-09-26 ENCOUNTER — Encounter (HOSPITAL_COMMUNITY): Payer: Self-pay

## 2018-09-26 ENCOUNTER — Emergency Department (HOSPITAL_COMMUNITY): Payer: Self-pay

## 2018-09-26 ENCOUNTER — Emergency Department (HOSPITAL_COMMUNITY)
Admission: EM | Admit: 2018-09-26 | Discharge: 2018-09-26 | Disposition: A | Discharge status: Home / Self Care | Payer: Self-pay | Attending: Emergency Medicine | Admitting: Emergency Medicine

## 2018-09-26 ENCOUNTER — Other Ambulatory Visit: Payer: Self-pay

## 2018-09-26 DIAGNOSIS — F17210 Nicotine dependence, cigarettes, uncomplicated: Secondary | ICD-10-CM | POA: Insufficient documentation

## 2018-09-26 DIAGNOSIS — R109 Unspecified abdominal pain: Secondary | ICD-10-CM

## 2018-09-26 DIAGNOSIS — K644 Residual hemorrhoidal skin tags: Secondary | ICD-10-CM | POA: Insufficient documentation

## 2018-09-26 DIAGNOSIS — R110 Nausea: Secondary | ICD-10-CM | POA: Insufficient documentation

## 2018-09-26 DIAGNOSIS — Z79899 Other long term (current) drug therapy: Secondary | ICD-10-CM | POA: Insufficient documentation

## 2018-09-26 DIAGNOSIS — I10 Essential (primary) hypertension: Secondary | ICD-10-CM | POA: Insufficient documentation

## 2018-09-26 DIAGNOSIS — J45909 Unspecified asthma, uncomplicated: Secondary | ICD-10-CM | POA: Insufficient documentation

## 2018-09-26 DIAGNOSIS — R1033 Periumbilical pain: Secondary | ICD-10-CM | POA: Insufficient documentation

## 2018-09-26 LAB — CBC WITH DIFFERENTIAL/PLATELET
Abs Immature Granulocytes: 0.02 10*3/uL (ref 0.00–0.07)
BASOS ABS: 0.1 10*3/uL (ref 0.0–0.1)
Basophils Relative: 1 %
EOS PCT: 4 %
Eosinophils Absolute: 0.4 10*3/uL (ref 0.0–0.5)
HEMATOCRIT: 40.1 % (ref 36.0–46.0)
HEMOGLOBIN: 12.3 g/dL (ref 12.0–15.0)
Immature Granulocytes: 0 %
LYMPHS PCT: 33 %
Lymphs Abs: 3.4 10*3/uL (ref 0.7–4.0)
MCH: 28 pg (ref 26.0–34.0)
MCHC: 30.7 g/dL (ref 30.0–36.0)
MCV: 91.1 fL (ref 80.0–100.0)
MONOS PCT: 5 %
Monocytes Absolute: 0.5 10*3/uL (ref 0.1–1.0)
NRBC: 0 % (ref 0.0–0.2)
Neutro Abs: 5.6 10*3/uL (ref 1.7–7.7)
Neutrophils Relative %: 57 %
Platelets: 239 10*3/uL (ref 150–400)
RBC: 4.4 MIL/uL (ref 3.87–5.11)
RDW: 16.1 % — ABNORMAL HIGH (ref 11.5–15.5)
WBC: 10.1 10*3/uL (ref 4.0–10.5)

## 2018-09-26 LAB — COMPREHENSIVE METABOLIC PANEL
ALBUMIN: 3.2 g/dL — AB (ref 3.5–5.0)
ALK PHOS: 53 U/L (ref 38–126)
ALT: 17 U/L (ref 0–44)
ANION GAP: 9 (ref 5–15)
AST: 22 U/L (ref 15–41)
BUN: 14 mg/dL (ref 6–20)
CALCIUM: 8.3 mg/dL — AB (ref 8.9–10.3)
CO2: 20 mmol/L — AB (ref 22–32)
CREATININE: 1.08 mg/dL — AB (ref 0.44–1.00)
Chloride: 105 mmol/L (ref 98–111)
GFR calc Af Amer: >60 mL/min (ref >60)
GFR calc non Af Amer: 58 mL/min — ABNORMAL LOW (ref >60)
GLUCOSE: 88 mg/dL (ref 70–99)
Potassium: 3.5 mmol/L (ref 3.5–5.1)
SODIUM: 134 mmol/L — AB (ref 135–145)
Total Bilirubin: 0.3 mg/dL (ref 0.3–1.2)
Total Protein: 6.3 g/dL — ABNORMAL LOW (ref 6.5–8.1)

## 2018-09-26 LAB — TYPE AND SCREEN
ABO/RH(D): O NEG
ANTIBODY SCREEN: NEGATIVE

## 2018-09-26 LAB — POC OCCULT BLOOD, ED: Fecal Occult Bld: POSITIVE — AB

## 2018-09-26 LAB — I-STAT CG4 LACTIC ACID, ED: Lactic Acid, Venous: 0.71 mmol/L (ref 0.5–1.9)

## 2018-09-26 LAB — LIPASE, BLOOD: Lipase: 29 U/L (ref 11–51)

## 2018-09-26 MED ORDER — IOHEXOL 300 MG/ML  SOLN
100.0000 mL | Freq: Once | INTRAMUSCULAR | Status: AC | PRN
  Administered 2018-09-26: 100 mL via INTRAVENOUS

## 2018-09-26 MED ORDER — FENTANYL CITRATE (PF) 100 MCG/2ML IJ SOLN
50.0000 ug | Freq: Once | INTRAMUSCULAR | Status: AC
  Administered 2018-09-26: 50 ug via INTRAVENOUS
  Filled 2018-09-26: qty 2

## 2018-09-26 MED ORDER — ONDANSETRON HCL 4 MG/2ML IJ SOLN
4.0000 mg | Freq: Once | INTRAMUSCULAR | Status: AC
  Administered 2018-09-26: 4 mg via INTRAVENOUS
  Filled 2018-09-26: qty 2

## 2018-09-26 NOTE — Discharge Instructions (Addendum)
START TAKING PROBIOTICS/LACTOBACILLUS. DRINK PLENTY OF FLUIDS AND GRADUALLY WORK YOUR WAY TO A NORMAL DIET. FOLLOW UP WITH PRIMARY CARE PROVIDER NEXT WEEK. RETURN TO ER IF ANY FEVER, WORSENING PAIN, WORSENING BLEEDING, OR VOMITING.

## 2018-09-26 NOTE — ED Triage Notes (Signed)
Pt states she was seen here x1 week ago and was diagnosed with colitis. Pt states she has had rectal bleeding since being discharged. States initially blood was bright red but has since changed and is now dark red. Pt rates umbilical pain 28/76, endorses nausea and diarrhea, denies vomiting.

## 2018-09-26 NOTE — ED Notes (Signed)
Patient transported to CT 

## 2018-09-26 NOTE — ED Provider Notes (Signed)
[facility] EMERGENCY DEPARTMENT Provider Note   CSN: [id number] Arrival date & time: 09/26/18  1738     History   Chief Complaint Chief Complaint  Patient presents with  . Rectal Bleeding    HPI Brie Eppard is a 52 y.o. female.  52yo F w/ PMH including bipolar d/o, asthma, HTN, GERD, previous substance abuse who p/w abd pain and bloody stools. Pt presented here on 11/8 after 3 days of bloody diarrhea and abd pain. Workup included CT showing colitis. She was started on cipro and flagyl and instructed to f/u with GI. She has not f/u but has taken the antibiotics, tonight is her last dose. She reports the abdominal pain and blood resolved for several days but then started back up about 2 days ago. Abd pain is constant and periumbilical, worse with eating and drinking. Blood in stool has changed from bright red to dark red. She has had 6 episodes today and states she is no longer passing stool, just blood. She endorses nausea, no vomiting, fevers, or urinary symptoms. No sick contacts, heavy NSAID use, alcohol use, or recent travel. [place] for colonscopy which was notable for polyps. She has never been seen by GI here since she moved 5 years ago.   The history is provided by the patient.    Past Medical History:  Diagnosis Date  . Anxiety   . Arthritis    "left knee" (07/05/2016)  . Asthma   . Bipolar 1 disorder ([place])   . Depression   . GERD (gastroesophageal reflux disease)   . Hypertension   . MI (mitral incompetence)   . Post traumatic stress disorder   . Schizophrenia ([place])   . Stomach ulcer     Patient Active Problem List   Diagnosis Date Noted  . Cocaine abuse ([place]) 12/27/2017  . Rectal bleeding 12/27/2017  . NSTEMI (non-ST elevated myocardial infarction) ([place]) 12/23/2017  . Acute viral bronchitis 06/14/2017  . Asthma exacerbation 06/14/2017  . Chest pain 07/05/2016    Past Surgical History:  Procedure  Laterality Date  . CARDIAC CATHETERIZATION  07/05/2016  . CARDIAC CATHETERIZATION N/A 07/05/2016   Procedure: Left Heart Cath and Coronary Angiography;  Surgeon: Adrian Prows, MD;  Location: [facility];  Service: Cardiovascular;  Laterality: N/A;  . CYST EXCISION Right    "wrist"  . DILATION AND CURETTAGE OF UTERUS    . FOOT SURGERY Bilateral    "corns removed"  . LEFT HEART CATH AND CORONARY ANGIOGRAPHY N/A 12/24/2017   Procedure: LEFT HEART CATH AND CORONARY ANGIOGRAPHY;  Surgeon: Nigel Mormon, MD;  Location: [facility];  Service: Cardiovascular;  Laterality: N/A;  . TUBAL LIGATION       OB History   None      Home Medications    Prior to Admission medications   Medication Sig Start Date End Date Taking? Authorizing Provider  albuterol (PROVENTIL HFA;VENTOLIN HFA) 108 (90 BASE) MCG/ACT inhaler Inhale 2 puffs into the lungs every 6 (six) hours as needed for wheezing or shortness of breath.    [provider]  amLODipine (NORVASC) 5 MG tablet Take 1 tablet (5 mg total) by mouth daily. 06/10/18   Geradine Girt, DO  ciprofloxacin (CIPRO) 500 MG tablet Take 1 tablet (500 mg total) by mouth 2 (two) times daily. One po bid x 7 days 09/18/18   Domenic Moras, PA-C  hydrocortisone (ANUSOL-HC) 25 MG suppository  Place 1 suppository (25 mg total) rectally 2 (two) times daily. For 7 days 09/18/18   Domenic Moras, PA-C  loratadine (CLARITIN) 10 MG tablet Take 1 tablet (10 mg total) by mouth daily. Patient not taking: Reported on 06/26/2018 06/16/17   Thomasene Ripple, MD  metroNIDAZOLE (FLAGYL) 500 MG tablet Take 1 tablet (500 mg total) by mouth 2 (two) times daily. 09/18/18   Domenic Moras, PA-C  nitroGLYCERIN (NITROSTAT) 0.4 MG SL tablet Place 1 tablet (0.4 mg total) under the tongue every 5 (five) minutes x 3 doses as needed for chest pain. 02/11/18   Law, Bea Graff, PA-C  omeprazole (PRILOSEC) 20 MG capsule Take 20 mg by mouth daily.     [provider]    Family  History Family History  Problem Relation Age of Onset  . Breast cancer Unknown   . Lung cancer Unknown   . Congestive Heart Failure Unknown   . Diabetes Unknown   . Hypertension Unknown     Social History Social History   Tobacco Use  . Smoking status: Current Some Day Smoker    Packs/day: 0.50    Years: 35.00    Pack years: 17.50    Types: Cigarettes  . Smokeless tobacco: Never Used  Substance Use Topics  . Alcohol use: Yes    Alcohol/week: 7.0 standard drinks    Types: 7 Cans of beer per week    Comment: socially  . Drug use: Yes    Types: Cocaine    Comment: once a month     Allergies   Aspirin and Food   Review of Systems Review of Systems All other systems reviewed and are negative except that which was mentioned in HPI   Physical Exam Updated Vital Signs BP (!) 134/99   Pulse 63   Temp 98.2 F (36.8 C) (Oral)   Ht 5' 8.5" (1.74 m)   Wt 74.8 kg   SpO2 97%   BMI 24.72 kg/m   Physical Exam  Constitutional: She is oriented to person, place, and time. She appears well-developed and well-nourished. No distress.  Uncomfortable, mild distress 2/2 pain  HENT:  Head: Normocephalic and atraumatic.  Moist mucous membranes  Eyes: Conjunctivae are normal.  Neck: Neck supple.  Cardiovascular: Normal rate, regular rhythm and normal heart sounds.  No murmur heard. Pulmonary/Chest: Effort normal and breath sounds normal.  Abdominal: Soft. She exhibits no distension. Bowel sounds are increased. There is tenderness in the epigastric area and periumbilical area. There is guarding (voluntary). There is no rigidity and no rebound.  Genitourinary:  Genitourinary Comments: Large external hemorrhoid w/ no active bleeding, mildly tender, no gross blood or melena on rectal exam  Musculoskeletal: She exhibits no edema.  Neurological: She is alert and oriented to person, place, and time.  Fluent speech  Skin: Skin is warm and dry.  Psychiatric: She has a normal mood and  affect. Judgment normal.  Nursing note and vitals reviewed.  Chaperone was present during exam.   ED Treatments / Results  Labs (all labs ordered are listed, but only abnormal results are displayed) Labs Reviewed  COMPREHENSIVE METABOLIC PANEL - Abnormal; Notable for the following components:      Result Value   Sodium 134 (*)    CO2 20 (*)    Creatinine, Ser 1.08 (*)    Calcium 8.3 (*)    Total Protein 6.3 (*)    Albumin 3.2 (*)    GFR calc non Af Amer 58 (*)  All other components within normal limits  CBC WITH DIFFERENTIAL/PLATELET - Abnormal; Notable for the following components:   RDW 16.1 (*)    All other components within normal limits  POC OCCULT BLOOD, ED - Abnormal; Notable for the following components:   Fecal Occult Bld POSITIVE (*)    All other components within normal limits  LIPASE, BLOOD  I-STAT CG4 LACTIC ACID, ED  TYPE AND SCREEN    EKG None  Radiology Ct Abdomen Pelvis W Contrast  Result Date: 09/26/2018 CLINICAL DATA:  Acute onset of rectal bleeding. Nausea and diarrhea. Periumbilical abdominal pain. EXAM: CT ABDOMEN AND PELVIS WITH CONTRAST TECHNIQUE: Multidetector CT imaging of the abdomen and pelvis was performed using the standard protocol following bolus administration of intravenous contrast. CONTRAST:  1108m OMNIPAQUE IOHEXOL 300 MG/ML  SOLN COMPARISON:  CT of the abdomen and pelvis performed 09/18/2018 FINDINGS: Lower chest: Minimal right basilar atelectasis is noted. The visualized portions of the mediastinum are unremarkable. Hepatobiliary: The liver is unremarkable in appearance. The gallbladder is unremarkable in appearance. The common bile duct remains normal in caliber. Pancreas: The pancreas is within normal limits. Spleen: The spleen is unremarkable in appearance. Adrenals/Urinary Tract: The adrenal glands are unremarkable in appearance. The kidneys are within normal limits. There is no evidence of hydronephrosis. No renal or ureteral stones  are identified. No perinephric stranding is seen. Stomach/Bowel: The stomach is unremarkable in appearance. The small bowel is within normal limits. The appendix is not well characterized; there is no evidence for appendicitis. The colon is unremarkable in appearance. Evaluation is somewhat suboptimal due to motion artifact. Vascular/Lymphatic: The abdominal aorta is unremarkable in appearance. Mild calcification is noted along the left common iliac artery. The inferior vena cava is grossly unremarkable. No retroperitoneal lymphadenopathy is seen. No pelvic sidewall lymphadenopathy is identified. Reproductive: The bladder is mildly distended and grossly unremarkable. The uterus contains multiple fibroids, some of which are calcified. The ovaries are grossly symmetric. No suspicious adnexal masses are seen. Other: No additional soft tissue abnormalities are seen. Musculoskeletal: No acute osseous abnormalities are identified. The visualized musculature is unremarkable in appearance. IMPRESSION: 1. No acute abnormality seen to explain the patient's hematochezia. 2. Fibroid uterus noted. Electronically Signed   By: JGarald BaldingM.D.   On: 09/26/2018 21:11    Procedures Procedures (including critical care time)  Medications Ordered in ED Medications  ondansetron (ZOFRAN) injection 4 mg (4 mg Intravenous Given 09/26/18 1837)  fentaNYL (SUBLIMAZE) injection 50 mcg (50 mcg Intravenous Given 09/26/18 1838)  iohexol (OMNIPAQUE) 300 MG/ML solution 100 mL (100 mLs Intravenous Contrast Given 09/26/18 2035)     Initial Impression / Assessment and Plan / ED Course  I have reviewed the triage vital signs and the nursing notes.  Pertinent labs & imaging results that were available during my care of the patient were reviewed by me and considered in my medical decision making (see chart for details).    In pain on exam but non-toxic. No peritonitis. Periumbilical pain noted.  Lab work shows reassuring CMP and  lipase, normal lactate, normal CBC.  Because of patient's persistent tenderness and reports of return of sx despite abx, obtained repeat CT to ensure no worsening colitis or acute complications such as perforation.  CT was unremarkable.  Later rectal exam showed external hemorrhoid which is likely the explanation of her bleeding.  I have recommended that she take final dose of antibiotics tonight and start taking lactobacillus.  Provided with general surgery follow-up information if she  continues to have problems with hemorrhoids.  Instructed to see PCP next week for follow-up.  Extensively reviewed return precautions.  She voiced understanding. Drank gingerale prior to d/c.  Final Clinical Impressions(s) / ED Diagnoses   Final diagnoses:  Abdominal pain, unspecified abdominal location  Bleeding external hemorrhoids    ED Discharge Orders    None       , Wenda Overland, MD 09/26/18 2217

## 2018-10-06 ENCOUNTER — Other Ambulatory Visit: Payer: Self-pay

## 2018-10-06 ENCOUNTER — Encounter (HOSPITAL_COMMUNITY): Payer: Self-pay | Admitting: Emergency Medicine

## 2018-10-06 ENCOUNTER — Emergency Department (HOSPITAL_COMMUNITY)
Admission: EM | Admit: 2018-10-06 | Discharge: 2018-10-06 | Disposition: A | Discharge status: Home / Self Care | Payer: Self-pay | Attending: Emergency Medicine | Admitting: Emergency Medicine

## 2018-10-06 ENCOUNTER — Emergency Department (HOSPITAL_COMMUNITY): Payer: Self-pay

## 2018-10-06 DIAGNOSIS — I10 Essential (primary) hypertension: Secondary | ICD-10-CM | POA: Insufficient documentation

## 2018-10-06 DIAGNOSIS — R1033 Periumbilical pain: Secondary | ICD-10-CM | POA: Insufficient documentation

## 2018-10-06 DIAGNOSIS — F17210 Nicotine dependence, cigarettes, uncomplicated: Secondary | ICD-10-CM | POA: Insufficient documentation

## 2018-10-06 DIAGNOSIS — F1490 Cocaine use, unspecified, uncomplicated: Secondary | ICD-10-CM

## 2018-10-06 DIAGNOSIS — Z79899 Other long term (current) drug therapy: Secondary | ICD-10-CM | POA: Insufficient documentation

## 2018-10-06 DIAGNOSIS — J45901 Unspecified asthma with (acute) exacerbation: Secondary | ICD-10-CM | POA: Insufficient documentation

## 2018-10-06 DIAGNOSIS — R079 Chest pain, unspecified: Secondary | ICD-10-CM | POA: Insufficient documentation

## 2018-10-06 LAB — URINALYSIS, ROUTINE W REFLEX MICROSCOPIC
Bilirubin Urine: NEGATIVE
Glucose, UA: NEGATIVE mg/dL
Ketones, ur: 20 mg/dL — AB
Nitrite: NEGATIVE
Protein, ur: 30 mg/dL — AB
SPECIFIC GRAVITY, URINE: 1.034 — AB (ref 1.005–1.030)
pH: 5 (ref 5.0–8.0)

## 2018-10-06 LAB — CBC WITH DIFFERENTIAL/PLATELET
Abs Immature Granulocytes: 0.05 10*3/uL (ref 0.00–0.07)
Basophils Absolute: 0.1 10*3/uL (ref 0.0–0.1)
Basophils Relative: 1 %
Eosinophils Absolute: 0.1 10*3/uL (ref 0.0–0.5)
Eosinophils Relative: 0 %
HCT: 43.7 % (ref 36.0–46.0)
Hemoglobin: 13.3 g/dL (ref 12.0–15.0)
Immature Granulocytes: 0 %
Lymphocytes Relative: 14 %
Lymphs Abs: 1.6 10*3/uL (ref 0.7–4.0)
MCH: 26.5 pg (ref 26.0–34.0)
MCHC: 30.4 g/dL (ref 30.0–36.0)
MCV: 87.2 fL (ref 80.0–100.0)
Monocytes Absolute: 0.5 10*3/uL (ref 0.1–1.0)
Monocytes Relative: 5 %
Neutro Abs: 9.1 10*3/uL — ABNORMAL HIGH (ref 1.7–7.7)
Neutrophils Relative %: 80 %
Platelets: 340 10*3/uL (ref 150–400)
RBC: 5.01 MIL/uL (ref 3.87–5.11)
RDW: 16.4 % — ABNORMAL HIGH (ref 11.5–15.5)
WBC: 11.4 10*3/uL — ABNORMAL HIGH (ref 4.0–10.5)
nRBC: 0 % (ref 0.0–0.2)

## 2018-10-06 LAB — COMPREHENSIVE METABOLIC PANEL
ALT: 20 U/L (ref 0–44)
AST: 26 U/L (ref 15–41)
Albumin: 3.3 g/dL — ABNORMAL LOW (ref 3.5–5.0)
Alkaline Phosphatase: 59 U/L (ref 38–126)
Anion gap: 10 (ref 5–15)
BUN: 8 mg/dL (ref 6–20)
CHLORIDE: 107 mmol/L (ref 98–111)
CO2: 20 mmol/L — ABNORMAL LOW (ref 22–32)
CREATININE: 0.9 mg/dL (ref 0.44–1.00)
Calcium: 8.6 mg/dL — ABNORMAL LOW (ref 8.9–10.3)
GFR calc Af Amer: >60 mL/min (ref >60)
GFR calc non Af Amer: >60 mL/min (ref >60)
Glucose, Bld: 101 mg/dL — ABNORMAL HIGH (ref 70–99)
Potassium: 3.4 mmol/L — ABNORMAL LOW (ref 3.5–5.1)
SODIUM: 137 mmol/L (ref 135–145)
Total Bilirubin: 0.7 mg/dL (ref 0.3–1.2)
Total Protein: 6.5 g/dL (ref 6.5–8.1)

## 2018-10-06 LAB — BASIC METABOLIC PANEL
Anion gap: 20 — ABNORMAL HIGH (ref 5–15)
BUN: 8 mg/dL (ref 6–20)
CO2: 13 mmol/L — ABNORMAL LOW (ref 22–32)
Calcium: 9.4 mg/dL (ref 8.9–10.3)
Chloride: 105 mmol/L (ref 98–111)
Creatinine, Ser: 0.95 mg/dL (ref 0.44–1.00)
GFR calc Af Amer: >60 mL/min (ref >60)
GFR calc non Af Amer: >60 mL/min (ref >60)
Glucose, Bld: 68 mg/dL — ABNORMAL LOW (ref 70–99)
Potassium: 3.5 mmol/L (ref 3.5–5.1)
Sodium: 138 mmol/L (ref 135–145)

## 2018-10-06 LAB — I-STAT TROPONIN, ED
Troponin i, poc: 0 ng/mL (ref 0.00–0.08)
Troponin i, poc: 0 ng/mL (ref 0.00–0.08)

## 2018-10-06 LAB — RAPID URINE DRUG SCREEN, HOSP PERFORMED
Amphetamines: NOT DETECTED
Barbiturates: NOT DETECTED
Benzodiazepines: NOT DETECTED
Cocaine: POSITIVE — AB
Opiates: POSITIVE — AB
Tetrahydrocannabinol: NOT DETECTED

## 2018-10-06 LAB — SALICYLATE LEVEL

## 2018-10-06 LAB — I-STAT CG4 LACTIC ACID, ED
Lactic Acid, Venous: 3.98 mmol/L (ref 0.5–1.9)
Lactic Acid, Venous: 2.07 mmol/L (ref 0.5–1.9)

## 2018-10-06 LAB — CBG MONITORING, ED: Glucose-Capillary: 125 mg/dL — ABNORMAL HIGH (ref 70–99)

## 2018-10-06 LAB — LIPASE, BLOOD: LIPASE: 23 U/L (ref 11–51)

## 2018-10-06 LAB — ETHANOL: Alcohol, Ethyl (B): <10 mg/dL (ref <10)

## 2018-10-06 IMAGING — CT CT ABD-PELV W/ CM
2 of 8 series · 15 of 46 positions shown, 17 images · IV contrast (iopamidol)
Comparison: None.

CLINICAL DATA: 52-year-old female with history of shortness of
breath and periumbilical abdominal pain with nausea and diarrhea.
Lactic acidosis.

EXAM:
CT ANGIOGRAPHY CHEST
CT ABDOMEN AND PELVIS WITH CONTRAST
TECHNIQUE: Multidetector CT imaging of the chest was performed using the
standard protocol during bolus administration of intravenous
contrast. Multiplanar CT image reconstructions and MIPs were
obtained to evaluate the vascular anatomy. Multidetector CT imaging
of the abdomen and pelvis was performed using the standard protocol
during bolus administration of intravenous contrast.
CONTRAST:  100mL [HT] IOPAMIDOL ([HT]) INJECTION 76%

[Series 6: abd/ pelvis 5.0 i30f 2 · axial · 0.81mm/px · z∈[+622,+1052]mm · 12 of 98 slices shown, 14 images]
[im 6/98  soft-tissue]
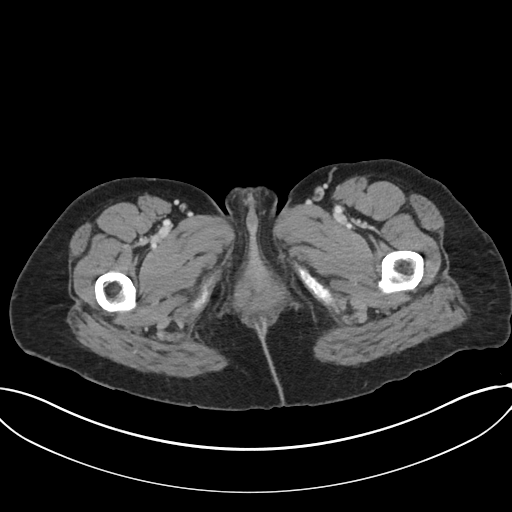
[im 6/98  bone]
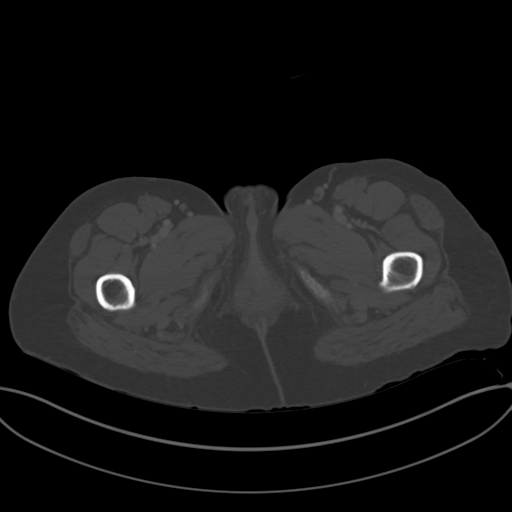
[im 16/98  soft-tissue]
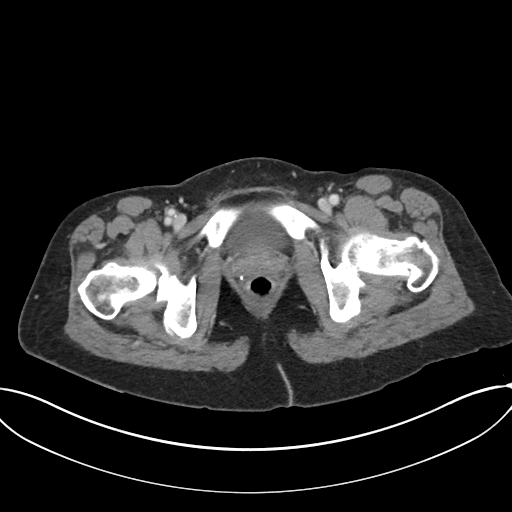
[im 21/98  soft-tissue]
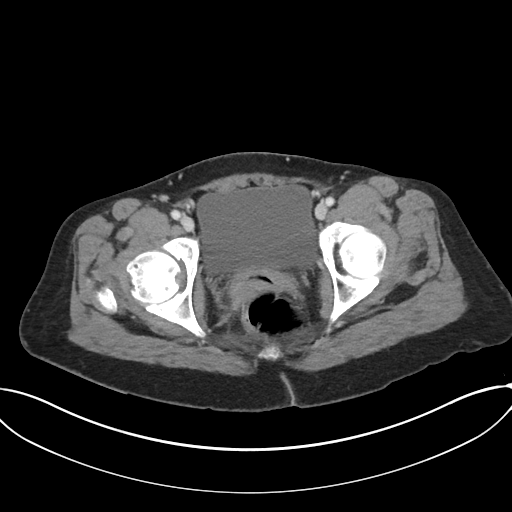
[im 31/98  soft-tissue]
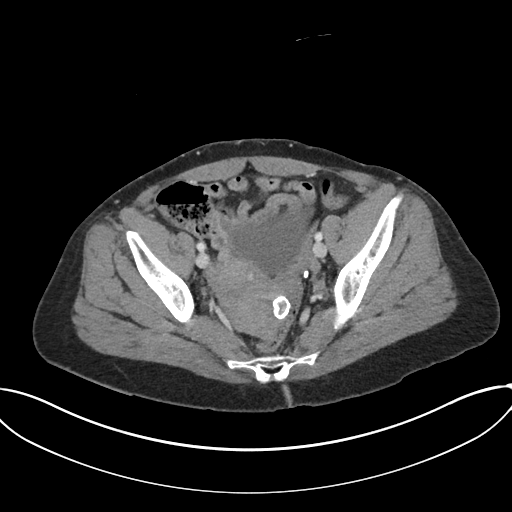
[im 36/98  soft-tissue]
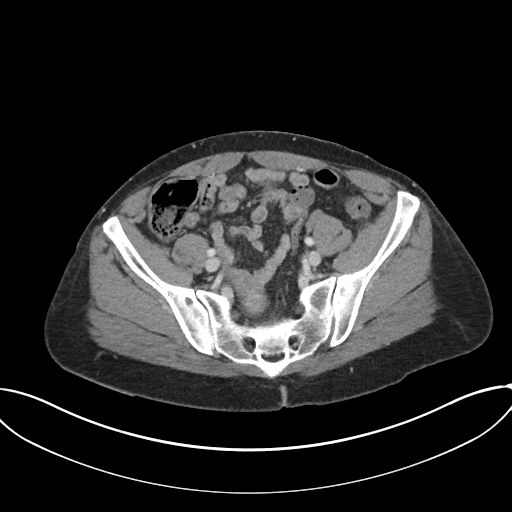
[im 46/98  soft-tissue]
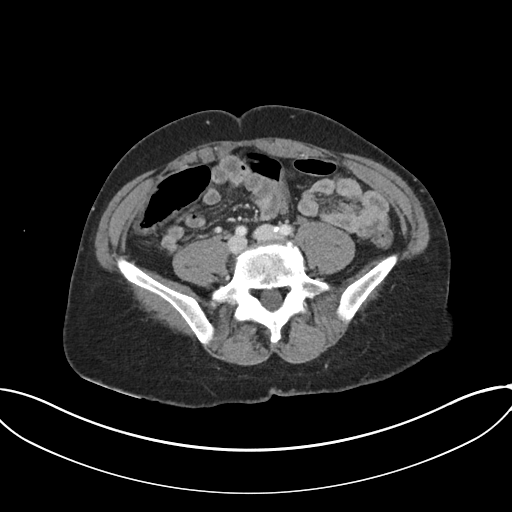
[im 52/98  soft-tissue]
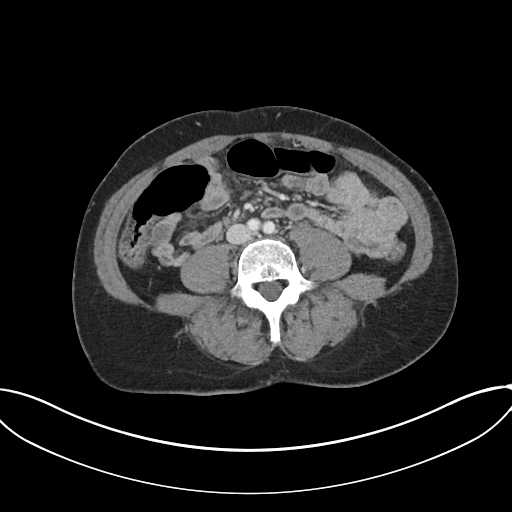
[im 62/98  soft-tissue]
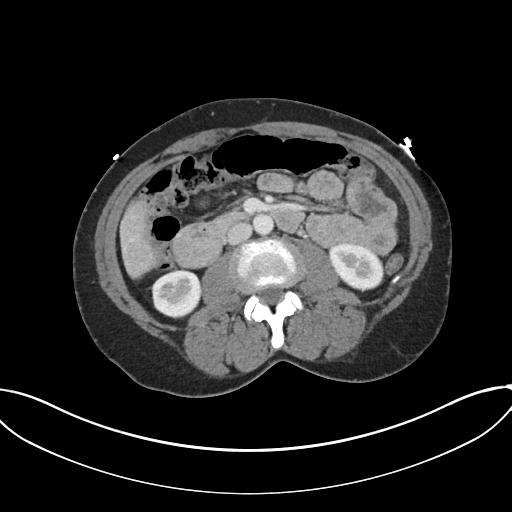
[im 67/98  soft-tissue]
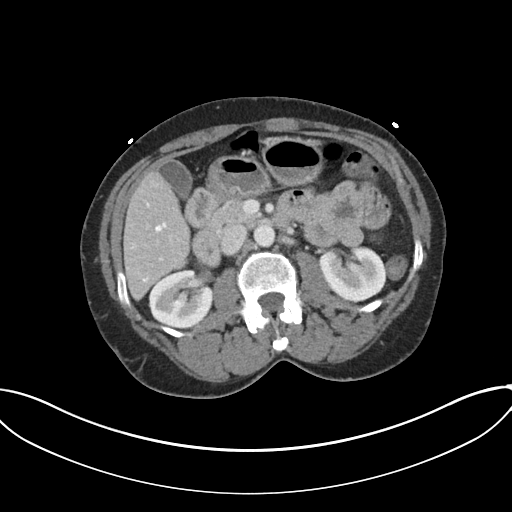
[im 67/98  bone]
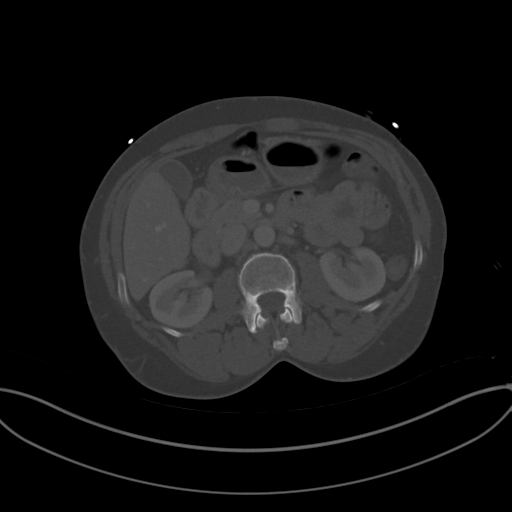
[im 77/98  soft-tissue]
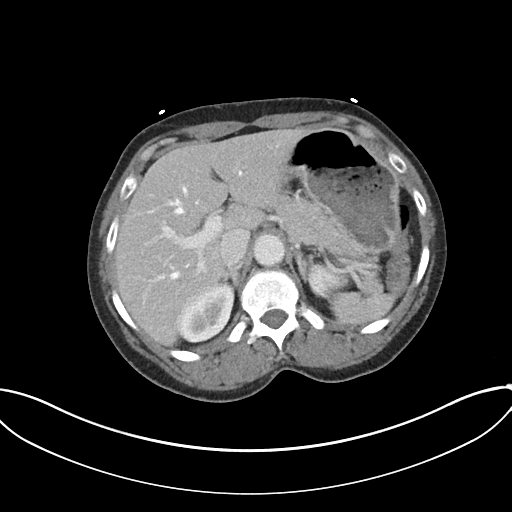
[im 82/98  soft-tissue]
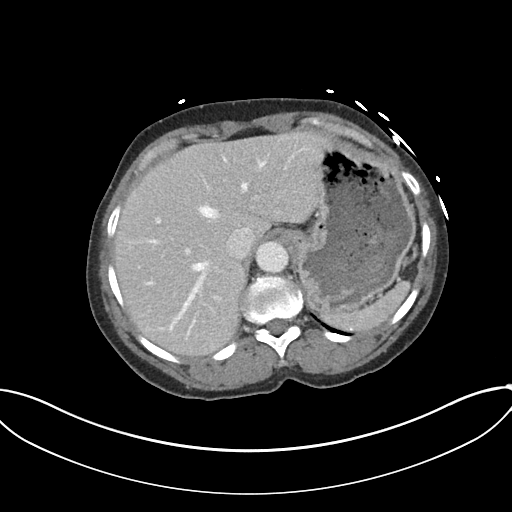
[im 92/98  soft-tissue]
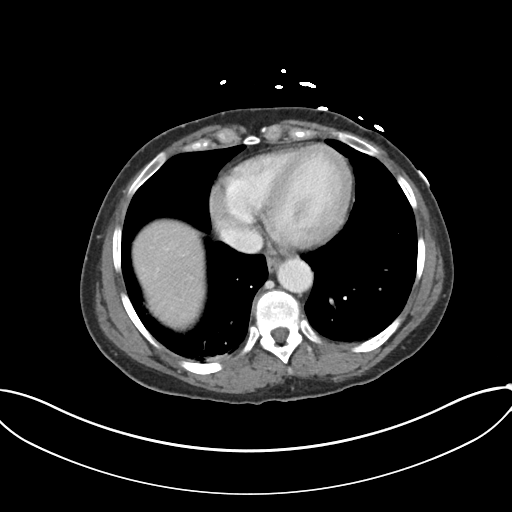

[Series 9: coronal soft tissue · coronal · 0.76mm/px · 3 of 88 slices shown]
[im 30/88  soft-tissue]
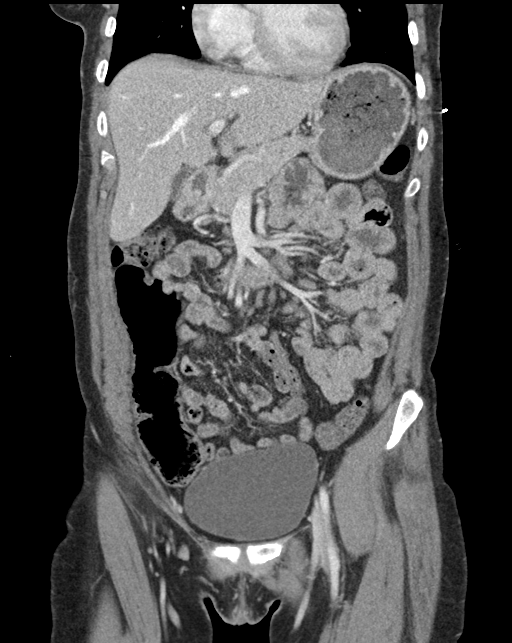
[im 39/88  soft-tissue]
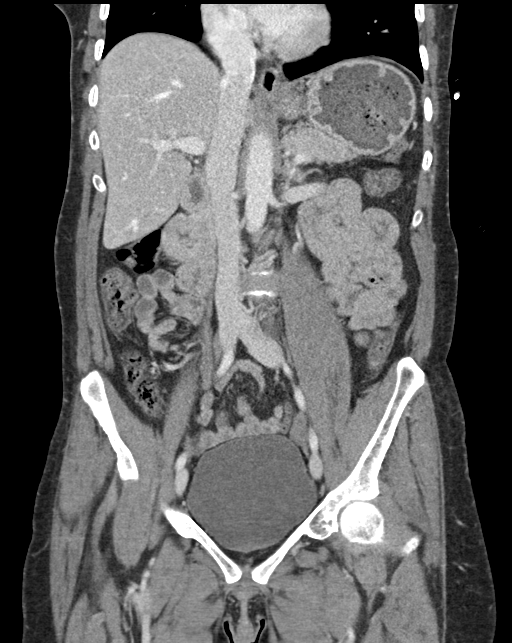
[im 49/88  soft-tissue]
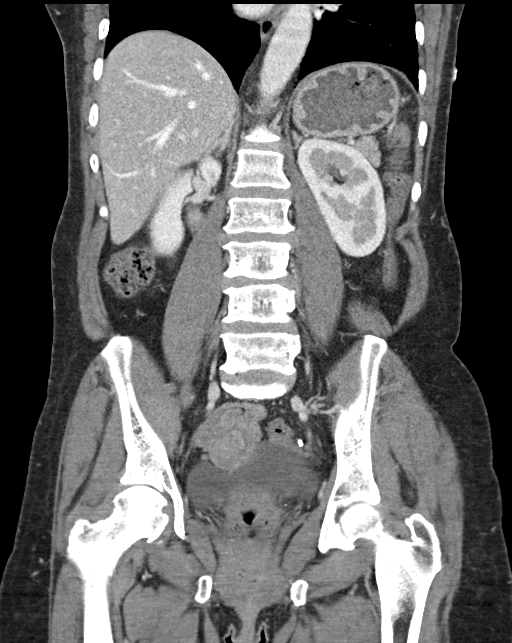

[15 of 46 positions shown; findings below may reference images not displayed]

FINDINGS: CTA CHEST FINDINGS

Cardiovascular: No filling defects in the pulmonary arterial tree to
suggest underlying pulmonary embolism. Heart size is normal. There
is no significant pericardial fluid, thickening or pericardial
calcification. No atherosclerotic calcifications in the thoracic
aorta or the coronary arteries.

Mediastinum/Nodes: No pathologically enlarged mediastinal or hilar
lymph nodes. Esophagus is unremarkable in appearance. No axillary
lymphadenopathy.

Lungs/Pleura: No suspicious appearing pulmonary nodules or masses.
No acute consolidative airspace disease. No pleural effusions.

Musculoskeletal: There are no aggressive appearing lytic or blastic
lesions noted in the visualized portions of the skeleton.

Review of the MIP images confirms the above findings.

CT ABDOMEN and PELVIS FINDINGS

Hepatobiliary: No suspicious cystic or solid hepatic lesions. No
intra or extrahepatic biliary ductal dilatation. Gallbladder is
normal in appearance.

Pancreas: No pancreatic mass. No pancreatic ductal dilatation. No
pancreatic or peripancreatic fluid or inflammatory changes.

Spleen: Unremarkable.

Adrenals/Urinary Tract: Bilateral kidneys and bilateral adrenal
glands are normal in appearance. No hydroureteronephrosis. Urinary
bladder is normal in appearance.

Stomach/Bowel: Normal appearance of the stomach. No pathologic
dilatation of small bowel or colon. The appendix is not confidently
identified and may be surgically absent. Regardless, there are no
inflammatory changes noted adjacent to the cecum to suggest the
presence of an acute appendicitis at this time.

Vascular/Lymphatic: Atherosclerotic calcifications in the pelvic
vasculature. No aneurysm or dissection noted in the abdomen or
pelvis.

Reproductive: Uterus is heterogeneous in appearance with multiple
small heterogeneously enhancing lesions, and a densely calcified
lesion in the left side of the uterus, most compatible with a
fibroid uterus. Ovaries are unremarkable in appearance.

Other: No significant volume of ascites.  No pneumoperitoneum.

Musculoskeletal: There are no aggressive appearing lytic or blastic
lesions noted in the visualized portions of the skeleton.

Review of the MIP images confirms the above findings.
IMPRESSION: 1. No evidence of pulmonary embolism.
2. No acute findings are noted in the abdomen or pelvis to account
for the patient's symptoms.
3. Mild atherosclerotic calcifications in the pelvic vasculature.

## 2018-10-06 IMAGING — DX DG CHEST 2V
2 series · 2 of 2 positions shown · non-contrast
Comparison: Chest x-ray of [DATE]

CLINICAL DATA: Left-sided chest pain and dizziness over the last 3
hours, smoking history, also history of asthma

EXAM:
CHEST - 2 VIEW

[chest pa]
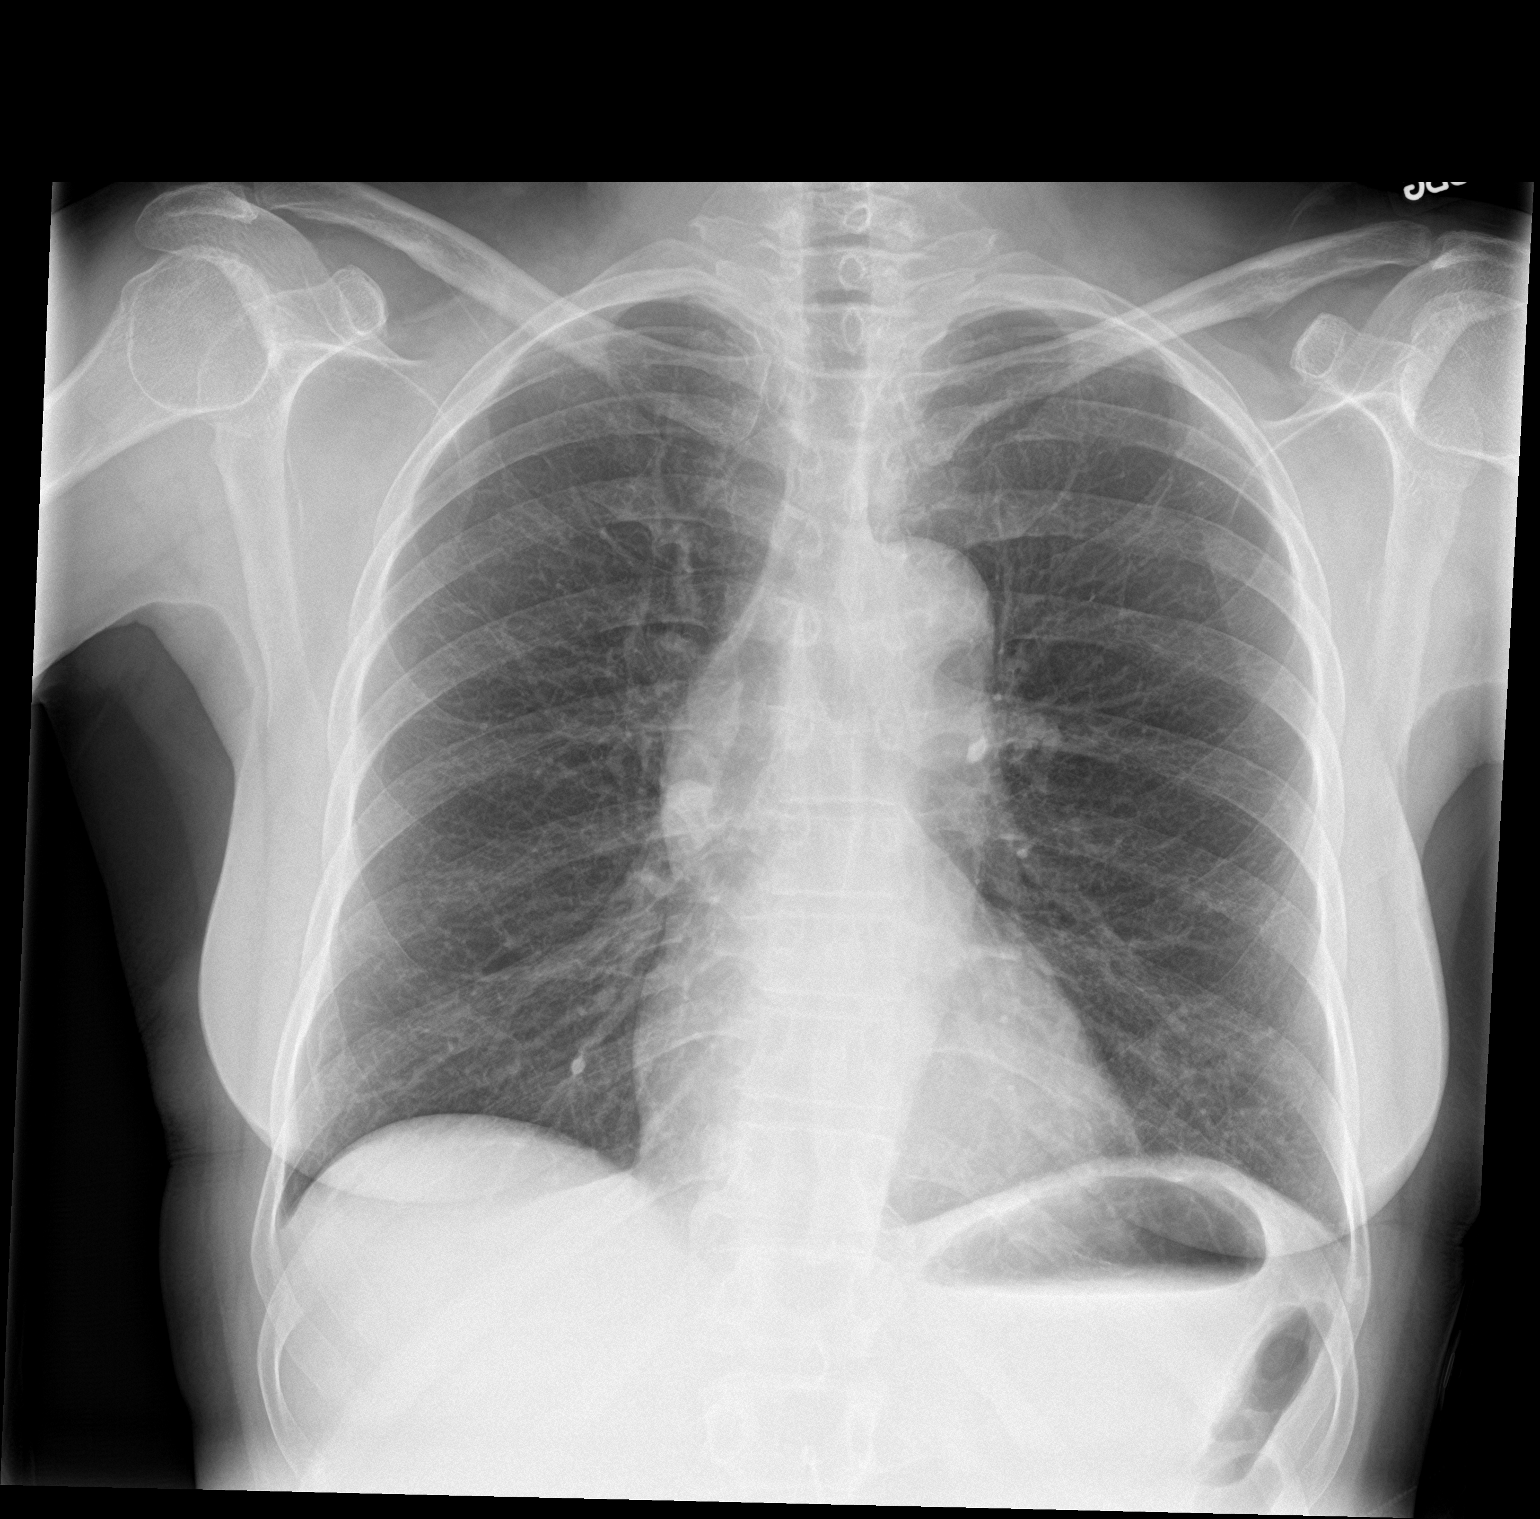

[chest lat]
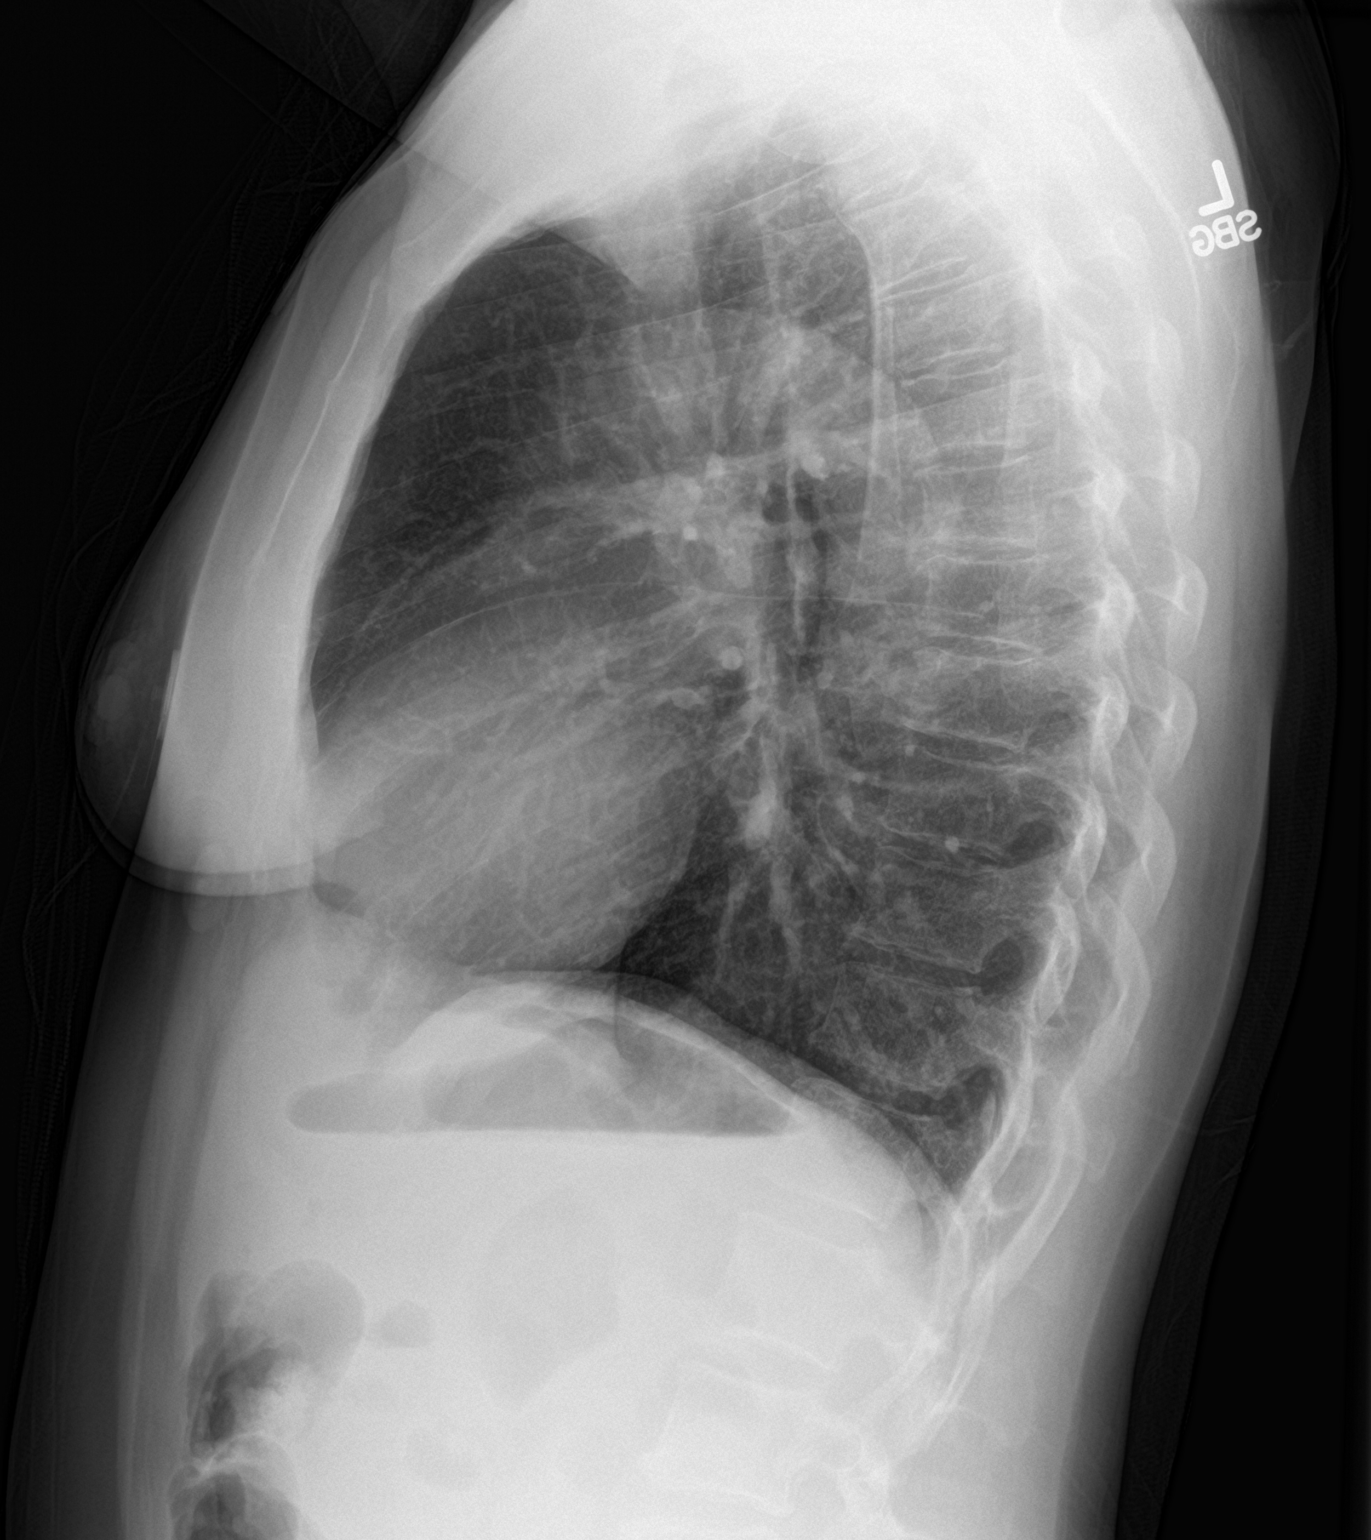

[2 of 2 positions shown; findings below may reference images not displayed]

FINDINGS: No active infiltrate or effusion is seen. Mediastinal and hilar
contours are unremarkable. The heart is within normal limits in
size. No acute bony abnormality is seen.
IMPRESSION: No active cardiopulmonary disease.

## 2018-10-06 MED ORDER — SODIUM CHLORIDE 0.9 % IV BOLUS
1000.0000 mL | Freq: Once | INTRAVENOUS | Status: AC
  Administered 2018-10-06: 1000 mL via INTRAVENOUS

## 2018-10-06 MED ORDER — IOPAMIDOL (ISOVUE-370) INJECTION 76%
100.0000 mL | Freq: Once | INTRAVENOUS | Status: AC | PRN
  Administered 2018-10-06: 100 mL via INTRAVENOUS

## 2018-10-06 MED ORDER — FENTANYL CITRATE (PF) 100 MCG/2ML IJ SOLN
50.0000 ug | Freq: Once | INTRAMUSCULAR | Status: AC
  Administered 2018-10-06: 50 ug via INTRAVENOUS
  Filled 2018-10-06: qty 2

## 2018-10-06 MED ORDER — SODIUM CHLORIDE 0.9 % IV SOLN
Freq: Once | INTRAVENOUS | Status: AC
  Administered 2018-10-06: 19:00:00 via INTRAVENOUS

## 2018-10-06 MED ORDER — ACETAMINOPHEN 325 MG PO TABS
650.0000 mg | ORAL_TABLET | Freq: Once | ORAL | Status: AC
  Administered 2018-10-06: 650 mg via ORAL
  Filled 2018-10-06: qty 2

## 2018-10-06 MED ORDER — ONDANSETRON HCL 4 MG/2ML IJ SOLN
4.0000 mg | Freq: Once | INTRAMUSCULAR | Status: AC
  Administered 2018-10-06: 4 mg via INTRAVENOUS
  Filled 2018-10-06: qty 2

## 2018-10-06 MED ORDER — DIPHENHYDRAMINE HCL 25 MG PO CAPS
50.0000 mg | ORAL_CAPSULE | Freq: Once | ORAL | Status: AC
  Administered 2018-10-06: 50 mg via ORAL
  Filled 2018-10-06: qty 2

## 2018-10-06 NOTE — Discharge Instructions (Signed)
You were seen here for chest pain.  Please stop using cocaine.  Continuing to use cocaine can lead to further pain and even death. I have provided you with a list of resources for this. Please use the sheet to call and find help. Please follow up with your primary care provider by the end of the week. Please call tomorrow to schedule an appointment. Please see attached handout. If you develop worsening or new concerning symptoms you can return to the emergency department for re-evaluation.   Return instructions:   Please return to the Emergency Department if you do not get better, if you get worse, or new symptoms OR  - Fever (temperature greater than 101.53F)  - Bleeding that does not stop with holding pressure to the area    -Severe pain (please note that you may be more sore the day after your accident)  - Worsening, new or concerning chest Pain or abdominal pain  - Difficulty breathing  - Severe nausea or vomiting  - Inability to tolerate food and liquids  - Passing out  - Skin becoming red around your wounds  - Change in mental status (confusion or lethargy)  - New numbness or weakness     Please return if you have any other emergent concerns.  Additional Information:  Your vital signs today were: BP 127/76    Pulse 83    Temp 97.9 F (36.6 C) (Oral)    Resp (!) 8    Ht 5' 8"  (1.727 m)    Wt 68 kg    SpO2 95%    BMI 22.81 kg/m  If your blood pressure (BP) was elevated above 135/85 this visit, please have this repeated by your doctor within one month.

## 2018-10-06 NOTE — ED Notes (Signed)
Patient verbalizes understanding of discharge instructions. Opportunity for questioning and answers were provided. Armband removed by staff, pt discharged from ED ambulatory to home.  

## 2018-10-06 NOTE — ED Notes (Signed)
Pt clutching her chest c/o pain. Notified Lenn Sink, Pa-C who states he will go see the patient shortly. Reassessed vitals, stable.

## 2018-10-06 NOTE — ED Notes (Signed)
Patient transported to CT 

## 2018-10-06 NOTE — ED Notes (Signed)
Pt CBG was 125, notified Sarah(RN)

## 2018-10-06 NOTE — ED Notes (Signed)
Got patient on the monitor did ekg shown to er doctor patient is resting with nurse at bedside and call bell in reach

## 2018-10-06 NOTE — ED Provider Notes (Addendum)
[facility] EMERGENCY DEPARTMENT Provider Note   CSN: [id number] Arrival date & time: 10/06/18  1357   History   Chief Complaint Chief Complaint  Patient presents with  . Chest Pain    HPI Denise Knight is a 52 y.o. female.  HPI   52 year old female with a past medical history of bipolar, PTSD, GERD, cocaine use presents today with complaints of chest pain.  Patient notes that she was on the phone having a argument when she developed pressure-like pain over her anterior chest.  She denies any associated shortness of breath, radiation of pain, diaphoresis nausea, vomiting, or abdominal pain.  Patient notes this is similar to previous episodes of chest pain.  She reports taking 2 nitro at home which slightly improved her symptoms.  Patient notes a generalized headache presently with no neurological deficits.  EMS arrived, she was tremulous and severely anxious.  They gave her morphine which seemed to help her anxiety.  She denies any infectious etiology.  Patient notes a history of smoking and hypertension, notes family history of cardiac disease.  Patient notes that her last cocaine with use was 2 days ago, none today.  Pt notes that she was drinking heavy last night and into the morning.   Past Medical History:  Diagnosis Date  . Anxiety   . Arthritis    "left knee" (07/05/2016)  . Asthma   . Bipolar 1 disorder ([place])   . Depression   . GERD (gastroesophageal reflux disease)   . Hypertension   . MI (mitral incompetence)   . Post traumatic stress disorder   . Schizophrenia ([place])   . Stomach ulcer     Patient Active Problem List   Diagnosis Date Noted  . Cocaine abuse ([place]) 12/27/2017  . Rectal bleeding 12/27/2017  . NSTEMI (non-ST elevated myocardial infarction) ([place]) 12/23/2017  . Acute viral bronchitis 06/14/2017  . Asthma exacerbation 06/14/2017  . Chest pain 07/05/2016    Past Surgical History:  Procedure Laterality Date  . CARDIAC  CATHETERIZATION  07/05/2016  . CARDIAC CATHETERIZATION N/A 07/05/2016   Procedure: Left Heart Cath and Coronary Angiography;  Surgeon: Adrian Prows, MD;  Location: [facility];  Service: Cardiovascular;  Laterality: N/A;  . CYST EXCISION Right    "wrist"  . DILATION AND CURETTAGE OF UTERUS    . FOOT SURGERY Bilateral    "corns removed"  . LEFT HEART CATH AND CORONARY ANGIOGRAPHY N/A 12/24/2017   Procedure: LEFT HEART CATH AND CORONARY ANGIOGRAPHY;  Surgeon: Nigel Mormon, MD;  Location: [facility];  Service: Cardiovascular;  Laterality: N/A;  . TUBAL LIGATION       OB History   None      Home Medications    Prior to Admission medications   Medication Sig Start Date End Date Taking? Authorizing Provider  albuterol (PROVENTIL HFA;VENTOLIN HFA) 108 (90 BASE) MCG/ACT inhaler Inhale 2 puffs into the lungs every 6 (six) hours as needed for wheezing or shortness of breath.    [provider]  amLODipine (NORVASC) 5 MG tablet Take 1 tablet (5 mg total) by mouth daily. 06/10/18   Geradine Girt, DO  ciprofloxacin (CIPRO) 500 MG tablet Take 1 tablet (500 mg total) by mouth 2 (two) times daily. One po bid x 7 days 09/18/18   Domenic Moras, PA-C  hydrocortisone (ANUSOL-HC) 25 MG suppository Place 1 suppository (25 mg total) rectally 2 (two) times daily. For 7 days 09/18/18   Domenic Moras, PA-C  loratadine (CLARITIN) 10 MG tablet Take 1 tablet (10 mg total) by mouth daily. Patient not taking: Reported on 06/26/2018 06/16/17   Thomasene Ripple, MD  metroNIDAZOLE (FLAGYL) 500 MG tablet Take 1 tablet (500 mg total) by mouth 2 (two) times daily. 09/18/18   Domenic Moras, PA-C  nitroGLYCERIN (NITROSTAT) 0.4 MG SL tablet Place 1 tablet (0.4 mg total) under the tongue every 5 (five) minutes x 3 doses as needed for chest pain. 02/11/18   Law, Bea Graff, PA-C  omeprazole (PRILOSEC) 20 MG capsule Take 20 mg by mouth daily.     [provider]    Family History Family History    Problem Relation Age of Onset  . Breast cancer Unknown   . Lung cancer Unknown   . Congestive Heart Failure Unknown   . Diabetes Unknown   . Hypertension Unknown     Social History Social History   Tobacco Use  . Smoking status: Current Some Day Smoker    Packs/day: 0.50    Years: 35.00    Pack years: 17.50    Types: Cigarettes  . Smokeless tobacco: Never Used  Substance Use Topics  . Alcohol use: Yes    Alcohol/week: 7.0 standard drinks    Types: 7 Cans of beer per week    Comment: socially  . Drug use: Yes    Types: Cocaine    Comment: 2 days     Allergies   Aspirin; Food; and Morphine and related   Review of Systems Review of Systems  All other systems reviewed and are negative.   Physical Exam Updated Vital Signs BP 114/86 (BP Location: Right Arm)   Pulse 84   Resp 16   Ht 5' 8"  (1.727 m)   Wt 68 kg   SpO2 98%   BMI 22.81 kg/m   Physical Exam  Constitutional: She is oriented to person, place, and time. She appears well-developed and well-nourished.  HENT:  Head: Normocephalic and atraumatic.  Eyes: Pupils are equal, round, and reactive to light. Conjunctivae are normal. Right eye exhibits no discharge. Left eye exhibits no discharge. No scleral icterus.  Neck: Normal range of motion. No JVD present. No tracheal deviation present.  Cardiovascular: Regular rhythm, normal heart sounds and intact distal pulses.  Tachycardia  Pulmonary/Chest: Effort normal and breath sounds normal. No stridor. No respiratory distress. She has no wheezes. She has no rales.  Musculoskeletal:  No lower extremity edema  Neurological: She is alert and oriented to person, place, and time. Coordination normal.  Psychiatric: She has a normal mood and affect. Her behavior is normal. Judgment and thought content normal.  Nursing note and vitals reviewed.    ED Treatments / Results  Labs (all labs ordered are listed, but only abnormal results are displayed) Labs Reviewed   CBC WITH DIFFERENTIAL/PLATELET - Abnormal; Notable for the following components:      Result Value   WBC 11.4 (*)    RDW 16.4 (*)    Neutro Abs 9.1 (*)    All other components within normal limits  BASIC METABOLIC PANEL - Abnormal; Notable for the following components:   CO2 13 (*)    Glucose, Bld 68 (*)    Anion gap 20 (*)    All other components within normal limits  RAPID URINE DRUG SCREEN, HOSP PERFORMED  I-STAT TROPONIN, ED    EKG None  Radiology Dg Chest 2 View  Result Date: 10/06/2018 CLINICAL DATA:  Left-sided chest pain and dizziness over the last  3 hours, smoking history, also history of asthma EXAM: CHEST - 2 VIEW COMPARISON:  Chest x-ray of 08/10/2017 FINDINGS: No active infiltrate or effusion is seen. Mediastinal and hilar contours are unremarkable. The heart is within normal limits in size. No acute bony abnormality is seen. IMPRESSION: No active cardiopulmonary disease. Electronically Signed   By: Ivar Drape M.D.   On: 10/06/2018 14:53    Procedures Procedures (including critical care time)  Medications Ordered in ED Medications  acetaminophen (TYLENOL) tablet 650 mg (650 mg Oral Given 10/06/18 1425)  diphenhydrAMINE (BENADRYL) capsule 50 mg (50 mg Oral Given 10/06/18 1425)     Initial Impression / Assessment and Plan / ED Course  I have reviewed the triage vital signs and the nursing notes.  Pertinent labs & imaging results that were available during my care of the patient were reviewed by me and considered in my medical decision making (see chart for details).     Labs: cBC, BMP, troponin  Imaging: Dg chest 2 view  Consults:  Therapeutics: Benadryl, Tylenol  Discharge Meds:   Assessment/Plan: 52 year old female presents today with complaints of chest pain.  I have very high suspicion for anxiety in this patient.  She is shaking her hands as I walk into the room.  She is calm with discussion.  She has pressure on her chest.  Her EKG and initial  troponin are reassuring.  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  Patient has no signs of pulmonary embolism, symptoms improving here.  Patient will have repeat troponin.  Anticipate outpatient follow-up given delta troponin.  Patient signed out to oncoming provider pending further evaluation and disposition.   Final Clinical Impressions(s) / ED Diagnoses   Final diagnoses:  Chest pain, unspecified type    ED Discharge Orders    None        Okey Regal, PA-C 10/06/18 [place], DO 10/06/18 1609

## 2018-10-06 NOTE — ED Triage Notes (Signed)
Pt arrives via EMS from home with substernal CP, denies radiation of pain. Was in an argument friend, became anxious, c/o 9/10 pain, allergic ASA took 2 nitro with some relief. 13m morphine given by EMS. 20g Lhand. Pt with anxious appearance breathing, RR22. HR 90. 130/90 bp.

## 2018-10-06 NOTE — ED Provider Notes (Signed)
Patient signed out by Okey Regal, PA-C at change of shift.  Please see his note for further details.  Briefly this is a 52 year old female with a history of bipolar, PTSD, GERD, cocaine use who presents emergency department today for chest pain.  Patient was reportedly having an argument on the phone when she developed left anterior chest pain that is not associate with shortness of breath, radiation, diaphoresis, nausea, vomiting or abdominal pain.  She notes similar episodes of chest pain in the past.  Her pain is not exertional, or pleuritic in nature.  She did try nitroglycerin prior to arrival with no significant benefit.  She was given morphine by EMS which seemed to help.  No reported infectious symptoms.  Last cocaine use was 2 days ago.  None today.  Physical Exam  BP 114/86 (BP Location: Right Arm)   Pulse 84   Resp 16   Ht 5' 8"  (1.727 m)   Wt 68 kg   SpO2 98%   BMI 22.81 kg/m   Physical Exam  Constitutional: She appears well-developed and well-nourished.  HENT:  Head: Normocephalic and atraumatic.  Right Ear: External ear normal.  Left Ear: External ear normal.  Nose: Nose normal.  Mouth/Throat: Uvula is midline, oropharynx is clear and moist and mucous membranes are normal. No tonsillar exudate.  Eyes: Pupils are equal, round, and reactive to light. Right eye exhibits no discharge. Left eye exhibits no discharge. No scleral icterus.  Neck: Trachea normal. Neck supple. No spinous process tenderness present. No neck rigidity. Normal range of motion present.  Cardiovascular: Normal rate, regular rhythm and intact distal pulses.  No murmur heard. Pulses:      Radial pulses are 2+ on the right side, and 2+ on the left side.       Dorsalis pedis pulses are 2+ on the right side, and 2+ on the left side.       Posterior tibial pulses are 2+ on the right side, and 2+ on the left side.  No lower extremity swelling or edema. Calves symmetric in size bilaterally.  Pulmonary/Chest:  Effort normal and breath sounds normal. She exhibits no tenderness.  Abdominal: Soft. Bowel sounds are normal. She exhibits no distension. There is tenderness in the periumbilical area. There is no rigidity, no rebound, no guarding, no tenderness at McBurney's point and negative Murphy's sign.  Musculoskeletal: She exhibits no edema.  Lymphadenopathy:    She has no cervical adenopathy.  Neurological: She is alert.  Skin: Skin is warm and dry. No rash noted. She is not diaphoretic.  Psychiatric: She has a normal mood and affect.  Nursing note and vitals reviewed.     ED Course/Procedures    Results for orders placed or performed during the hospital encounter of 10/06/18  Rapid urine drug screen (hospital performed)  Result Value Ref Range   Opiates POSITIVE (A) NONE DETECTED   Cocaine POSITIVE (A) NONE DETECTED   Benzodiazepines NONE DETECTED NONE DETECTED   Amphetamines NONE DETECTED NONE DETECTED   Tetrahydrocannabinol NONE DETECTED NONE DETECTED   Barbiturates NONE DETECTED NONE DETECTED  CBC with Differential  Result Value Ref Range   WBC 11.4 (H) 4.0 - 10.5 K/uL   RBC 5.01 3.87 - 5.11 MIL/uL   Hemoglobin 13.3 12.0 - 15.0 g/dL   HCT 43.7 36.0 - 46.0 %   MCV 87.2 80.0 - 100.0 fL   MCH 26.5 26.0 - 34.0 pg   MCHC 30.4 30.0 - 36.0 g/dL   RDW 16.4 (H)  11.5 - 15.5 %   Platelets 340 150 - 400 K/uL   nRBC 0.0 0.0 - 0.2 %   Neutrophils Relative % 80 %   Neutro Abs 9.1 (H) 1.7 - 7.7 K/uL   Lymphocytes Relative 14 %   Lymphs Abs 1.6 0.7 - 4.0 K/uL   Monocytes Relative 5 %   Monocytes Absolute 0.5 0.1 - 1.0 K/uL   Eosinophils Relative 0 %   Eosinophils Absolute 0.1 0.0 - 0.5 K/uL   Basophils Relative 1 %   Basophils Absolute 0.1 0.0 - 0.1 K/uL   Immature Granulocytes 0 %   Abs Immature Granulocytes 0.05 0.00 - 0.07 K/uL  Basic metabolic panel  Result Value Ref Range   Sodium 138 135 - 145 mmol/L   Potassium 3.5 3.5 - 5.1 mmol/L   Chloride 105 98 - 111 mmol/L   CO2 13 (L)  22 - 32 mmol/L   Glucose, Bld 68 (L) 70 - 99 mg/dL   BUN 8 6 - 20 mg/dL   Creatinine, Ser 0.95 0.44 - 1.00 mg/dL   Calcium 9.4 8.9 - 10.3 mg/dL   GFR calc non Af Amer >60 >60 mL/min   GFR calc Af Amer >60 >60 mL/min   Anion gap 20 (H) 5 - 15  Salicylate level  Result Value Ref Range   Salicylate Lvl <6.3 2.8 - 30.0 mg/dL  Ethanol  Result Value Ref Range   Alcohol, Ethyl (B) <10 <10 mg/dL  Urinalysis, Routine w reflex microscopic  Result Value Ref Range   Color, Urine YELLOW YELLOW   APPearance HAZY (A) CLEAR   Specific Gravity, Urine 1.034 (H) 1.005 - 1.030   pH 5.0 5.0 - 8.0   Glucose, UA NEGATIVE NEGATIVE mg/dL   Hgb urine dipstick MODERATE (A) NEGATIVE   Bilirubin Urine NEGATIVE NEGATIVE   Ketones, ur 20 (A) NEGATIVE mg/dL   Protein, ur 30 (A) NEGATIVE mg/dL   Nitrite NEGATIVE NEGATIVE   Leukocytes, UA MODERATE (A) NEGATIVE   RBC / HPF 6-10 0 - 5 RBC/hpf   WBC, UA 21-50 0 - 5 WBC/hpf   Bacteria, UA RARE (A) NONE SEEN   Squamous Epithelial / LPF 11-20 0 - 5   Mucus PRESENT    Hyaline Casts, UA PRESENT   Lipase, blood  Result Value Ref Range   Lipase 23 11 - 51 U/L  Comprehensive metabolic panel  Result Value Ref Range   Sodium 137 135 - 145 mmol/L   Potassium 3.4 (L) 3.5 - 5.1 mmol/L   Chloride 107 98 - 111 mmol/L   CO2 20 (L) 22 - 32 mmol/L   Glucose, Bld 101 (H) 70 - 99 mg/dL   BUN 8 6 - 20 mg/dL   Creatinine, Ser 0.90 0.44 - 1.00 mg/dL   Calcium 8.6 (L) 8.9 - 10.3 mg/dL   Total Protein 6.5 6.5 - 8.1 g/dL   Albumin 3.3 (L) 3.5 - 5.0 g/dL   AST 26 15 - 41 U/L   ALT 20 0 - 44 U/L   Alkaline Phosphatase 59 38 - 126 U/L   Total Bilirubin 0.7 0.3 - 1.2 mg/dL   GFR calc non Af Amer >60 >60 mL/min   GFR calc Af Amer >60 >60 mL/min   Anion gap 10 5 - 15  I-Stat Troponin, ED (not at [facility])  Result Value Ref Range   Troponin i, poc 0.00 0.00 - 0.08 ng/mL   Comment 3          I-Stat CG4  Lactic Acid, ED  Result Value Ref Range   Lactic Acid, Venous 3.98 (HH) 0.5 -  1.9 mmol/L   Comment NOTIFIED PHYSICIAN   CBG monitoring, ED  Result Value Ref Range   Glucose-Capillary 125 (H) 70 - 99 mg/dL   Comment 1 Notify RN    Comment 2 Document in Chart   I-Stat CG4 Lactic Acid, ED  Result Value Ref Range   Lactic Acid, Venous 2.07 (HH) 0.5 - 1.9 mmol/L  I-stat troponin, ED  Result Value Ref Range   Troponin i, poc 0.00 0.00 - 0.08 ng/mL   Comment 3           Dg Chest 2 View  Result Date: 10/06/2018 CLINICAL DATA:  Left-sided chest pain and dizziness over the last 3 hours, smoking history, also history of asthma EXAM: CHEST - 2 VIEW COMPARISON:  Chest x-ray of 08/10/2017 FINDINGS: No active infiltrate or effusion is seen. Mediastinal and hilar contours are unremarkable. The heart is within normal limits in size. No acute bony abnormality is seen. IMPRESSION: No active cardiopulmonary disease. Electronically Signed   By: Ivar Drape M.D.   On: 10/06/2018 14:53   Ct Angio Chest Pe W/cm &/or Wo Cm  Result Date: 10/06/2018 CLINICAL DATA:  52 year old female with history of shortness of breath and periumbilical abdominal pain with nausea and diarrhea. Lactic acidosis. EXAM: CT ANGIOGRAPHY CHEST CT ABDOMEN AND PELVIS WITH CONTRAST TECHNIQUE: Multidetector CT imaging of the chest was performed using the standard protocol during bolus administration of intravenous contrast. Multiplanar CT image reconstructions and MIPs were obtained to evaluate the vascular anatomy. Multidetector CT imaging of the abdomen and pelvis was performed using the standard protocol during bolus administration of intravenous contrast. CONTRAST:  135m ISOVUE-370 IOPAMIDOL (ISOVUE-370) INJECTION 76% COMPARISON:  None. FINDINGS: CTA CHEST FINDINGS Cardiovascular: No filling defects in the pulmonary arterial tree to suggest underlying pulmonary embolism. Heart size is normal. There is no significant pericardial fluid, thickening or pericardial calcification. No atherosclerotic calcifications in the  thoracic aorta or the coronary arteries. Mediastinum/Nodes: No pathologically enlarged mediastinal or hilar lymph nodes. Esophagus is unremarkable in appearance. No axillary lymphadenopathy. Lungs/Pleura: No suspicious appearing pulmonary nodules or masses. No acute consolidative airspace disease. No pleural effusions. Musculoskeletal: There are no aggressive appearing lytic or blastic lesions noted in the visualized portions of the skeleton. Review of the MIP images confirms the above findings. CT ABDOMEN and PELVIS FINDINGS Hepatobiliary: No suspicious cystic or solid hepatic lesions. No intra or extrahepatic biliary ductal dilatation. Gallbladder is normal in appearance. Pancreas: No pancreatic mass. No pancreatic ductal dilatation. No pancreatic or peripancreatic fluid or inflammatory changes. Spleen: Unremarkable. Adrenals/Urinary Tract: Bilateral kidneys and bilateral adrenal glands are normal in appearance. No hydroureteronephrosis. Urinary bladder is normal in appearance. Stomach/Bowel: Normal appearance of the stomach. No pathologic dilatation of small bowel or colon. The appendix is not confidently identified and may be surgically absent. Regardless, there are no inflammatory changes noted adjacent to the cecum to suggest the presence of an acute appendicitis at this time. Vascular/Lymphatic: Atherosclerotic calcifications in the pelvic vasculature. No aneurysm or dissection noted in the abdomen or pelvis. Reproductive: Uterus is heterogeneous in appearance with multiple small heterogeneously enhancing lesions, and a densely calcified lesion in the left side of the uterus, most compatible with a fibroid uterus. Ovaries are unremarkable in appearance. Other: No significant volume of ascites.  No pneumoperitoneum. Musculoskeletal: There are no aggressive appearing lytic or blastic lesions noted in the visualized portions of the skeleton.  Review of the MIP images confirms the above findings. IMPRESSION: 1. No  evidence of pulmonary embolism. 2. No acute findings are noted in the abdomen or pelvis to account for the patient's symptoms. 3. Mild atherosclerotic calcifications in the pelvic vasculature. Electronically Signed   By: Vinnie Langton M.D.   On: 10/06/2018 18:46   Ct Abdomen Pelvis W Contrast  Result Date: 10/06/2018 CLINICAL DATA:  52 year old female with history of shortness of breath and periumbilical abdominal pain with nausea and diarrhea. Lactic acidosis. EXAM: CT ANGIOGRAPHY CHEST CT ABDOMEN AND PELVIS WITH CONTRAST TECHNIQUE: Multidetector CT imaging of the chest was performed using the standard protocol during bolus administration of intravenous contrast. Multiplanar CT image reconstructions and MIPs were obtained to evaluate the vascular anatomy. Multidetector CT imaging of the abdomen and pelvis was performed using the standard protocol during bolus administration of intravenous contrast. CONTRAST:  125m ISOVUE-370 IOPAMIDOL (ISOVUE-370) INJECTION 76% COMPARISON:  None. FINDINGS: CTA CHEST FINDINGS Cardiovascular: No filling defects in the pulmonary arterial tree to suggest underlying pulmonary embolism. Heart size is normal. There is no significant pericardial fluid, thickening or pericardial calcification. No atherosclerotic calcifications in the thoracic aorta or the coronary arteries. Mediastinum/Nodes: No pathologically enlarged mediastinal or hilar lymph nodes. Esophagus is unremarkable in appearance. No axillary lymphadenopathy. Lungs/Pleura: No suspicious appearing pulmonary nodules or masses. No acute consolidative airspace disease. No pleural effusions. Musculoskeletal: There are no aggressive appearing lytic or blastic lesions noted in the visualized portions of the skeleton. Review of the MIP images confirms the above findings. CT ABDOMEN and PELVIS FINDINGS Hepatobiliary: No suspicious cystic or solid hepatic lesions. No intra or extrahepatic biliary ductal dilatation. Gallbladder is  normal in appearance. Pancreas: No pancreatic mass. No pancreatic ductal dilatation. No pancreatic or peripancreatic fluid or inflammatory changes. Spleen: Unremarkable. Adrenals/Urinary Tract: Bilateral kidneys and bilateral adrenal glands are normal in appearance. No hydroureteronephrosis. Urinary bladder is normal in appearance. Stomach/Bowel: Normal appearance of the stomach. No pathologic dilatation of small bowel or colon. The appendix is not confidently identified and may be surgically absent. Regardless, there are no inflammatory changes noted adjacent to the cecum to suggest the presence of an acute appendicitis at this time. Vascular/Lymphatic: Atherosclerotic calcifications in the pelvic vasculature. No aneurysm or dissection noted in the abdomen or pelvis. Reproductive: Uterus is heterogeneous in appearance with multiple small heterogeneously enhancing lesions, and a densely calcified lesion in the left side of the uterus, most compatible with a fibroid uterus. Ovaries are unremarkable in appearance. Other: No significant volume of ascites.  No pneumoperitoneum. Musculoskeletal: There are no aggressive appearing lytic or blastic lesions noted in the visualized portions of the skeleton. Review of the MIP images confirms the above findings. IMPRESSION: 1. No evidence of pulmonary embolism. 2. No acute findings are noted in the abdomen or pelvis to account for the patient's symptoms. 3. Mild atherosclerotic calcifications in the pelvic vasculature. Electronically Signed   By: DVinnie LangtonM.D.   On: 10/06/2018 18:46   Ct Abdomen Pelvis W Contrast  Result Date: 09/26/2018 CLINICAL DATA:  Acute onset of rectal bleeding. Nausea and diarrhea. Periumbilical abdominal pain. EXAM: CT ABDOMEN AND PELVIS WITH CONTRAST TECHNIQUE: Multidetector CT imaging of the abdomen and pelvis was performed using the standard protocol following bolus administration of intravenous contrast. CONTRAST:  1045mOMNIPAQUE  IOHEXOL 300 MG/ML  SOLN COMPARISON:  CT of the abdomen and pelvis performed 09/18/2018 FINDINGS: Lower chest: Minimal right basilar atelectasis is noted. The visualized portions of the mediastinum are unremarkable. Hepatobiliary:  The liver is unremarkable in appearance. The gallbladder is unremarkable in appearance. The common bile duct remains normal in caliber. Pancreas: The pancreas is within normal limits. Spleen: The spleen is unremarkable in appearance. Adrenals/Urinary Tract: The adrenal glands are unremarkable in appearance. The kidneys are within normal limits. There is no evidence of hydronephrosis. No renal or ureteral stones are identified. No perinephric stranding is seen. Stomach/Bowel: The stomach is unremarkable in appearance. The small bowel is within normal limits. The appendix is not well characterized; there is no evidence for appendicitis. The colon is unremarkable in appearance. Evaluation is somewhat suboptimal due to motion artifact. Vascular/Lymphatic: The abdominal aorta is unremarkable in appearance. Mild calcification is noted along the left common iliac artery. The inferior vena cava is grossly unremarkable. No retroperitoneal lymphadenopathy is seen. No pelvic sidewall lymphadenopathy is identified. Reproductive: The bladder is mildly distended and grossly unremarkable. The uterus contains multiple fibroids, some of which are calcified. The ovaries are grossly symmetric. No suspicious adnexal masses are seen. Other: No additional soft tissue abnormalities are seen. Musculoskeletal: No acute osseous abnormalities are identified. The visualized musculature is unremarkable in appearance. IMPRESSION: 1. No acute abnormality seen to explain the patient's hematochezia. 2. Fibroid uterus noted. Electronically Signed   By: Garald Balding M.D.   On: 09/26/2018 21:11   Ct Abdomen Pelvis W Contrast  Result Date: 09/18/2018 CLINICAL DATA:  52 y/o  F; three days of rectal bleeding. EXAM: CT  ABDOMEN AND PELVIS WITH CONTRAST TECHNIQUE: Multidetector CT imaging of the abdomen and pelvis was performed using the standard protocol following bolus administration of intravenous contrast. CONTRAST:  182m OMNIPAQUE IOHEXOL 300 MG/ML  SOLN COMPARISON:  08/10/2018 CT abdomen and pelvis. FINDINGS: Lower chest: No acute abnormality. Hepatobiliary: 7 mm segment 5 hyperdense focus, not visible on the delayed phase, likely flash hemangioma or transient hepatic attenuation difference (series 3, image 39). No additional focal liver abnormality is seen. No gallstones, gallbladder wall thickening, or biliary dilatation. Pancreas: Unremarkable. No pancreatic ductal dilatation or surrounding inflammatory changes. Spleen: Normal in size without focal abnormality. Adrenals/Urinary Tract: Adrenal glands are unremarkable. Kidneys are normal, without renal calculi, focal lesion, or hydronephrosis. Bladder is unremarkable. Stomach/Bowel: Stomach is within normal limits. Appendix appears normal. No obstructive or inflammatory changes of the small bowel. Mild diffuse low-attenuation wall thickening of the large bowel. Vascular/Lymphatic: No significant vascular findings are present. No enlarged abdominal or pelvic lymph nodes. Reproductive: Stable fibroid uterus. No adnexal mass. Other: Small fat containing paraumbilical hernia is stable. Musculoskeletal: No fracture is seen. Stable osteoarthrosis of the hip joints. IMPRESSION: 1. Mild wall thickening of colon may reflect colitis. 2. Stable small fat containing paraumbilical hernia. 3. Stable uterine fibroids. Electronically Signed   By: LKristine GarbeM.D.   On: 09/18/2018 15:17    EKG Interpretation  Date/Time:    Ventricular Rate:    PR Interval:    QRS Duration:   QT Interval:    QTC Calculation:   R Axis:     Text Interpretation:          Procedures  MDM   HPI, vitals, and exam as above.  3:54 PM Upon time of signout labs are returning and  anion gap was noted to be 20.  Patient CO2 noted to be 13.  --> Patient reports using alcohol all night. Husband states several beers into the morning. Temp checked and 97.9. Lactic acid, salicylate level and ethanol level added to evaluate for cause of anion gap acidosis.. Will give  1L of fluids. Patient blood sugar also noted to be low. Will give food and recheck. (given ginger ale and crackers). No signs of DKA. She is not a diabetic.    Plan to cycle Tn at 546  4:54 PM Patient's lactic acid is returned at 3.98.  Upon further questioning the patient does report she has been dealing with periumbilical abdominal pain as well as diarrhea and some bloody stools over the last several weeks.  On chart review patient is been seen here on 2 separate occasions for this.  The first occasion patient was found to have colitis and was given Cipro and Flagyl.  She notes she took this to completion.  On repeat imaging on the 16th her CT scan was negative.  Patient denies any fever, chills, cough, hemoptysis, lower extremity swelling, flank pain, urinary symptoms, pelvic pain, vaginal discharge, or emesis.  She notes she has had some nausea intermittently that is return here in the last few minutes.  She reports alcohol use frequently.  She denies other forms of alcohol use.  Patient denies any IV drug use.  No history of mechanical valve. Will obtain abdominal labs, CTA chest and CT abdomen to evaluate. Patient without obvious source of infection and stable vitals without fever, tachycardia, tachypnea, hypoxia or hypotension.  Patient does not meet Sirs or sepsis criteria at this current time.  7:25 PM Patient CTA of the chest unremarkable. No PE. No evidence of PNA or cause of lactic acidosis. Patient CT of the abdomen also unremarkable. No infectious cause of lactic acidosis.    Repeat CMP shows anion gap that has closed. Salicylate and alcohol levels are unremarkable. CO2 has improved.  Repeat lactic acid with  greater than 40% improvement.  UA was a dirty catch. Do not think this is consistent with UTI. Urine culture pending. Do not suspect sepsis at this time.  After discussion with my attending, Dr. Tyrone Nine, believe this is likely related to the patient's cocaine use.  Patient cleared her lactic acid and anion gap closed easily with IV fluids.  Her vital signs have remained stable.  Pending a repeat troponin that is normal patient will be safe for discharge.  I will counsel the patient on stopping using cocaine and also provide resources.  8:34 PM Repeat Tn wnl.  Will discharge home. No further workup indicated at this time. Again stressed the importance of cessation of cocaine use.  I have provided the patient with outpatient resources for this. Do not suspect CP is ACS. Recommended patient follow up with her pcp. Return precautions discussed.  Prior to discharge I reviewed results and plan with Dr. Wilson Singer who is in agreement.  Chest pain, unspecified type  Cocaine use     Jillyn Ledger, PA-C 10/06/18 [place], DO 10/07/18 1105

## 2018-10-07 LAB — URINE CULTURE

## 2018-10-19 ENCOUNTER — Telehealth (HOSPITAL_COMMUNITY): Payer: Self-pay | Admitting: Obstetrics and Gynecology

## 2018-10-19 NOTE — Telephone Encounter (Signed)
Called patient and left message regarding scheduling screening mammogram.

## 2019-04-13 ENCOUNTER — Emergency Department (HOSPITAL_COMMUNITY)
Admission: EM | Admit: 2019-04-13 | Discharge: 2019-04-13 | Disposition: A | Discharge status: Home / Self Care | Payer: HRSA Program | Attending: Emergency Medicine | Admitting: Emergency Medicine

## 2019-04-13 ENCOUNTER — Emergency Department (HOSPITAL_COMMUNITY): Payer: HRSA Program

## 2019-04-13 ENCOUNTER — Other Ambulatory Visit: Payer: Self-pay

## 2019-04-13 ENCOUNTER — Encounter (HOSPITAL_COMMUNITY): Payer: Self-pay | Admitting: Emergency Medicine

## 2019-04-13 DIAGNOSIS — F17210 Nicotine dependence, cigarettes, uncomplicated: Secondary | ICD-10-CM | POA: Insufficient documentation

## 2019-04-13 DIAGNOSIS — J4541 Moderate persistent asthma with (acute) exacerbation: Secondary | ICD-10-CM | POA: Diagnosis not present

## 2019-04-13 DIAGNOSIS — Z20828 Contact with and (suspected) exposure to other viral communicable diseases: Secondary | ICD-10-CM | POA: Diagnosis not present

## 2019-04-13 DIAGNOSIS — R0602 Shortness of breath: Secondary | ICD-10-CM | POA: Diagnosis present

## 2019-04-13 DIAGNOSIS — Z79899 Other long term (current) drug therapy: Secondary | ICD-10-CM | POA: Insufficient documentation

## 2019-04-13 DIAGNOSIS — R270 Ataxia, unspecified: Secondary | ICD-10-CM

## 2019-04-13 DIAGNOSIS — I10 Essential (primary) hypertension: Secondary | ICD-10-CM | POA: Insufficient documentation

## 2019-04-13 LAB — CBC WITH DIFFERENTIAL/PLATELET
Abs Immature Granulocytes: 0.01 10*3/uL (ref 0.00–0.07)
Basophils Absolute: 0.1 10*3/uL (ref 0.0–0.1)
Basophils Relative: 1 %
Eosinophils Absolute: 0.3 10*3/uL (ref 0.0–0.5)
Eosinophils Relative: 4 %
HCT: 35.8 % — ABNORMAL LOW (ref 36.0–46.0)
Hemoglobin: 11.6 g/dL — ABNORMAL LOW (ref 12.0–15.0)
Immature Granulocytes: 0 %
Lymphocytes Relative: 31 %
Lymphs Abs: 2 10*3/uL (ref 0.7–4.0)
MCH: 28 pg (ref 26.0–34.0)
MCHC: 32.4 g/dL (ref 30.0–36.0)
MCV: 86.3 fL (ref 80.0–100.0)
Monocytes Absolute: 0.5 10*3/uL (ref 0.1–1.0)
Monocytes Relative: 8 %
Neutro Abs: 3.6 10*3/uL (ref 1.7–7.7)
Neutrophils Relative %: 56 %
Platelets: 252 10*3/uL (ref 150–400)
RBC: 4.15 MIL/uL (ref 3.87–5.11)
RDW: 17 % — ABNORMAL HIGH (ref 11.5–15.5)
WBC: 6.4 10*3/uL (ref 4.0–10.5)
nRBC: 0 % (ref 0.0–0.2)

## 2019-04-13 LAB — BASIC METABOLIC PANEL
Anion gap: 7 (ref 5–15)
BUN: 11 mg/dL (ref 6–20)
CO2: 26 mmol/L (ref 22–32)
Calcium: 9.3 mg/dL (ref 8.9–10.3)
Chloride: 108 mmol/L (ref 98–111)
Creatinine, Ser: 1.09 mg/dL — ABNORMAL HIGH (ref 0.44–1.00)
GFR calc Af Amer: >60 mL/min (ref >60)
GFR calc non Af Amer: 58 mL/min — ABNORMAL LOW (ref >60)
Glucose, Bld: 86 mg/dL (ref 70–99)
Potassium: 3.9 mmol/L (ref 3.5–5.1)
Sodium: 141 mmol/L (ref 135–145)

## 2019-04-13 LAB — I-STAT TROPONIN, ED: Troponin i, poc: 0 ng/mL (ref 0.00–0.08)

## 2019-04-13 LAB — SARS CORONAVIRUS 2 BY RT PCR (HOSPITAL ORDER, PERFORMED IN ~~LOC~~ HOSPITAL LAB): SARS Coronavirus 2: NEGATIVE

## 2019-04-13 IMAGING — CR PORTABLE CHEST - 1 VIEW
1 series · 1 of 1 positions shown · non-contrast
Comparison: [DATE]

CLINICAL DATA: Shortness of breath.

EXAM:
PORTABLE CHEST 1 VIEW

[AP]
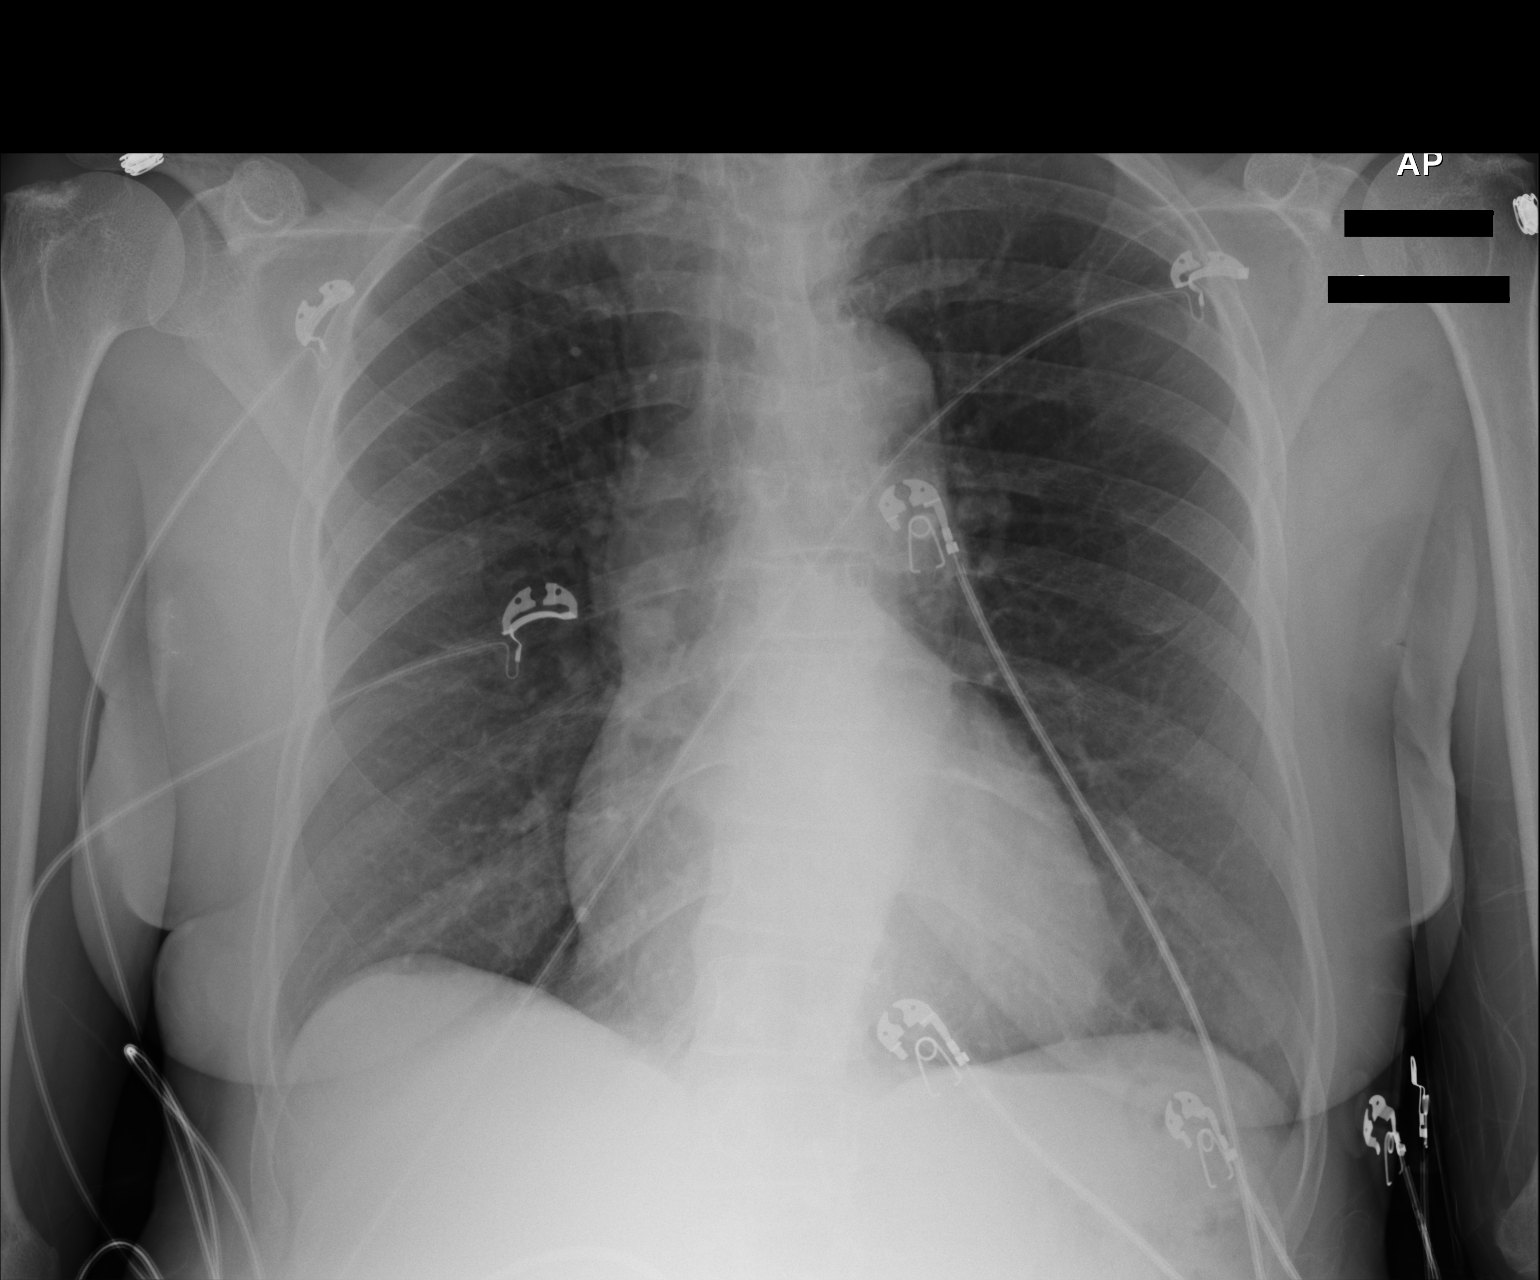

[1 of 1 positions shown; findings below may reference images not displayed]

FINDINGS: The heart size and mediastinal contours are within normal limits.
Both lungs are clear. The visualized skeletal structures are
unremarkable.
IMPRESSION: Normal exam.

## 2019-04-13 MED ORDER — ALBUTEROL SULFATE HFA 108 (90 BASE) MCG/ACT IN AERS
2.0000 | INHALATION_SPRAY | Freq: Four times a day (QID) | RESPIRATORY_TRACT | Status: DC
  Administered 2019-04-13 (×2): 2 via RESPIRATORY_TRACT

## 2019-04-13 MED ORDER — ALBUTEROL SULFATE HFA 108 (90 BASE) MCG/ACT IN AERS
2.0000 | INHALATION_SPRAY | Freq: Once | RESPIRATORY_TRACT | Status: AC
  Administered 2019-04-13: 2 via RESPIRATORY_TRACT
  Filled 2019-04-13: qty 6.7

## 2019-04-13 NOTE — ED Provider Notes (Signed)
[facility] EMERGENCY DEPARTMENT Provider Note   CSN: [id number] Arrival date & time: 04/13/19  1239    History   Chief Complaint Chief Complaint  Patient presents with  . Shortness of Breath    HPI Denise Knight is a 53 y.o. female.     Patient presenting with a complaint of shortness of breath.  Patient states that is been going on for a few days but got worse yesterday.  She went to [facility] yesterday but does not feel any better.  She is concerned about COVID-19 infection.  I did not test her yesterday.  Patient also with kind of gait being off leaning to the right onset of this was today.  Patient denies any headache any motor weakness any sensation decreased or any visual changes or speech difficulties.  Patient claims she has a history of asthma and ran out of her albuterol inhaler a few days ago and she thinks this may be why she is having the shortness of breath.  Patient denies any true fevers.  Past medical history significant for bipolar disease asthma hypertension and posttraumatic stress disorder.      Past Medical History:  Diagnosis Date  . Anxiety   . Arthritis    "left knee" (07/05/2016)  . Asthma   . Bipolar 1 disorder ([place])   . Depression   . GERD (gastroesophageal reflux disease)   . Hypertension   . MI (mitral incompetence)   . Post traumatic stress disorder   . Schizophrenia ([place])   . Stomach ulcer     Patient Active Problem List   Diagnosis Date Noted  . Cocaine abuse ([place]) 12/27/2017  . Rectal bleeding 12/27/2017  . NSTEMI (non-ST elevated myocardial infarction) ([place]) 12/23/2017  . Acute viral bronchitis 06/14/2017  . Asthma exacerbation 06/14/2017  . Chest pain 07/05/2016    Past Surgical History:  Procedure Laterality Date  . CARDIAC CATHETERIZATION  07/05/2016  . CARDIAC CATHETERIZATION N/A 07/05/2016   Procedure: Left Heart Cath and Coronary Angiography;  Surgeon: Adrian Prows, MD;  Location: [facility];  Service: Cardiovascular;  Laterality: N/A;  . CYST EXCISION Right    "wrist"  . DILATION AND CURETTAGE OF UTERUS    . FOOT SURGERY Bilateral    "corns removed"  . LEFT HEART CATH AND CORONARY ANGIOGRAPHY N/A 12/24/2017   Procedure: LEFT HEART CATH AND CORONARY ANGIOGRAPHY;  Surgeon: Nigel Mormon, MD;  Location: [facility];  Service: Cardiovascular;  Laterality: N/A;  . TUBAL LIGATION       OB History   No obstetric history on file.      Home Medications    Prior to Admission medications   Medication Sig Start Date End Date Taking? Authorizing Provider  albuterol (PROVENTIL HFA;VENTOLIN HFA) 108 (90 BASE) MCG/ACT inhaler Inhale 2 puffs into the lungs every 6 (six) hours as needed for wheezing or shortness of breath.    [provider]  amLODipine (NORVASC) 5 MG tablet Take 1 tablet (5 mg total) by mouth daily. 06/10/18   Geradine Girt, DO  ciprofloxacin (CIPRO) 500 MG tablet Take 1 tablet (500 mg total) by mouth 2 (two) times daily. One po bid x 7 days 09/18/18   Domenic Moras, PA-C  hydrocortisone (ANUSOL-HC) 25 MG suppository Place 1 suppository (25 mg total) rectally 2 (two) times daily. For 7 days 09/18/18   Domenic Moras, PA-C  loratadine (CLARITIN) 10 MG tablet Take 1 tablet (10 mg total) by  mouth daily. Patient not taking: Reported on 06/26/2018 06/16/17   Thomasene Ripple, MD  metroNIDAZOLE (FLAGYL) 500 MG tablet Take 1 tablet (500 mg total) by mouth 2 (two) times daily. 09/18/18   Domenic Moras, PA-C  nitroGLYCERIN (NITROSTAT) 0.4 MG SL tablet Place 1 tablet (0.4 mg total) under the tongue every 5 (five) minutes x 3 doses as needed for chest pain. 02/11/18   Law, Bea Graff, PA-C  omeprazole (PRILOSEC) 20 MG capsule Take 20 mg by mouth daily.     [provider]    Family History Family History  Problem Relation Age of Onset  . Breast cancer Other   . Lung cancer Other   . Congestive Heart Failure Other   . Diabetes Other   . Hypertension  Other     Social History Social History   Tobacco Use  . Smoking status: Current Some Day Smoker    Packs/day: 0.50    Years: 35.00    Pack years: 17.50    Types: Cigarettes  . Smokeless tobacco: Never Used  Substance Use Topics  . Alcohol use: Yes    Alcohol/week: 7.0 standard drinks    Types: 7 Cans of beer per week    Comment: socially  . Drug use: Yes    Types: Cocaine    Comment: 2 days     Allergies   Aspirin; Food; and Morphine and related   Review of Systems Review of Systems  Constitutional: Negative for chills and fever.  HENT: Negative for congestion, rhinorrhea and sore throat.   Eyes: Negative for photophobia and visual disturbance.  Respiratory: Positive for shortness of breath and wheezing. Negative for cough.   Cardiovascular: Negative for chest pain and leg swelling.  Gastrointestinal: Negative for abdominal pain, diarrhea, nausea and vomiting.  Genitourinary: Negative for dysuria.  Musculoskeletal: Negative for back pain and neck pain.  Skin: Negative for rash.  Neurological: Negative for dizziness, seizures, facial asymmetry, speech difficulty, weakness, light-headedness, numbness and headaches.  Hematological: Does not bruise/bleed easily.  Psychiatric/Behavioral: Negative for confusion.     Physical Exam Updated Vital Signs BP (!) 140/95   Pulse 72   Temp 98.3 F (36.8 C) (Oral)   Resp 17   Ht 1.74 m (5' 8.5")   Wt 70.8 kg   SpO2 98%   BMI 23.37 kg/m   Physical Exam Vitals signs and nursing note reviewed.  Constitutional:      General: She is not in acute distress.    Appearance: She is well-developed.  HENT:     Head: Normocephalic and atraumatic.  Eyes:     Extraocular Movements: Extraocular movements intact.     Conjunctiva/sclera: Conjunctivae normal.     Pupils: Pupils are equal, round, and reactive to light.  Neck:     Musculoskeletal: Normal range of motion and neck supple.  Cardiovascular:     Rate and Rhythm: Normal  rate and regular rhythm.     Heart sounds: Normal heart sounds. No murmur.  Pulmonary:     Effort: Pulmonary effort is normal. No respiratory distress.     Breath sounds: Normal breath sounds.  Abdominal:     Palpations: Abdomen is soft.     Tenderness: There is no abdominal tenderness.  Musculoskeletal: Normal range of motion.        General: No swelling.  Skin:    General: Skin is warm and dry.     Capillary Refill: Capillary refill takes less than 2 seconds.  Neurological:  General: No focal deficit present.     Mental Status: She is alert and oriented to person, place, and time.     Cranial Nerves: No cranial nerve deficit.     Sensory: No sensory deficit.     Motor: No weakness.     Coordination: Coordination normal.      ED Treatments / Results  Labs (all labs ordered are listed, but only abnormal results are displayed) Labs Reviewed  CBC WITH DIFFERENTIAL/PLATELET - Abnormal; Notable for the following components:      Result Value   Hemoglobin 11.6 (*)    HCT 35.8 (*)    RDW 17.0 (*)    All other components within normal limits  BASIC METABOLIC PANEL - Abnormal; Notable for the following components:   Creatinine, Ser 1.09 (*)    GFR calc non Af Amer 58 (*)    All other components within normal limits  SARS CORONAVIRUS 2 (HOSPITAL ORDER, [facility] [place])  I-STAT TROPONIN, ED   Results for orders placed or performed during the hospital encounter of 04/13/19  CBC with Differential/Platelet  Result Value Ref Range   WBC 6.4 4.0 - 10.5 K/uL   RBC 4.15 3.87 - 5.11 MIL/uL   Hemoglobin 11.6 (L) 12.0 - 15.0 g/dL   HCT 35.8 (L) 36.0 - 46.0 %   MCV 86.3 80.0 - 100.0 fL   MCH 28.0 26.0 - 34.0 pg   MCHC 32.4 30.0 - 36.0 g/dL   RDW 17.0 (H) 11.5 - 15.5 %   Platelets 252 150 - 400 K/uL   nRBC 0.0 0.0 - 0.2 %   Neutrophils Relative % 56 %   Neutro Abs 3.6 1.7 - 7.7 K/uL   Lymphocytes Relative 31 %   Lymphs Abs 2.0 0.7 - 4.0 K/uL   Monocytes  Relative 8 %   Monocytes Absolute 0.5 0.1 - 1.0 K/uL   Eosinophils Relative 4 %   Eosinophils Absolute 0.3 0.0 - 0.5 K/uL   Basophils Relative 1 %   Basophils Absolute 0.1 0.0 - 0.1 K/uL   Immature Granulocytes 0 %   Abs Immature Granulocytes 0.01 0.00 - 0.07 K/uL  Basic metabolic panel  Result Value Ref Range   Sodium 141 135 - 145 mmol/L   Potassium 3.9 3.5 - 5.1 mmol/L   Chloride 108 98 - 111 mmol/L   CO2 26 22 - 32 mmol/L   Glucose, Bld 86 70 - 99 mg/dL   BUN 11 6 - 20 mg/dL   Creatinine, Ser 1.09 (H) 0.44 - 1.00 mg/dL   Calcium 9.3 8.9 - 10.3 mg/dL   GFR calc non Af Amer 58 (L) >60 mL/min   GFR calc Af Amer >60 >60 mL/min   Anion gap 7 5 - 15  I-Stat Troponin, ED (not at [facility])  Result Value Ref Range   Troponin i, poc 0.00 0.00 - 0.08 ng/mL   Comment 3             EKG None  Radiology Dg Chest Port 1 View  Result Date: 04/13/2019 CLINICAL DATA:  Shortness of breath. EXAM: PORTABLE CHEST 1 VIEW COMPARISON:  10/06/2018 FINDINGS: The heart size and mediastinal contours are within normal limits. Both lungs are clear. The visualized skeletal structures are unremarkable. IMPRESSION: Normal exam. Electronically Signed   By: Lorriane Shire M.D.   On: 04/13/2019 13:26    Procedures Procedures (including critical care time)  Medications Ordered in ED Medications  albuterol (VENTOLIN HFA) 108 (90  Base) MCG/ACT inhaler 2 puff (2 puffs Inhalation Given 04/13/19 1506)  albuterol (VENTOLIN HFA) 108 (90 Base) MCG/ACT inhaler 2 puff (2 puffs Inhalation Given 04/13/19 1334)     COVID-19 positive test (U07.1, COVID-19) with Acute Respiratory Distress Syndrome (ARDS) (J80, ARDS) (If respiratory failure or sepsis present, add as separate assessment)   Initial Impression / Assessment and Plan / ED Course  I have reviewed the triage vital signs and the nursing notes.  Pertinent labs & imaging results that were available during my care of the patient were reviewed by me and considered in  my medical decision making (see chart for details).        Shortness of breath and wheezing resolved with albuterol treatments here.  Chest x-ray negative.  Basic labs without significant abnormality.  Troponin negative COVID-19 testing is pending.  Patient has MRI brain pending for the new complaint of ataxia to rule out stroke.  If negative patient can be discharged home with albuterol inhaler which is already been ordered to go.   Patient is neuro exam here without any acute findings however patient was not ambulated.  Due to the pending COVID-19 testing.  Final Clinical Impressions(s) / ED Diagnoses   Final diagnoses:  Moderate persistent asthma with exacerbation  Ataxia    ED Discharge Orders    None       Fredia Sorrow, MD 04/13/19 1511

## 2019-04-13 NOTE — ED Triage Notes (Signed)
Pt arrives with reports of SOB since yesterday. States she went to [facility] yesterday but does not feel any better. States they did not test her for covid 19. Denies any pain

## 2019-04-13 NOTE — Discharge Instructions (Addendum)
Use your inhaler two puffs every four hours Recheck with your doctor as needed

## 2019-04-13 NOTE — ED Provider Notes (Signed)
53 yo female ho asthma presents today with dyspnea and gait problems.  Wheezing on exam and symptoms resolved with nebs Covid test pending. Plan MRI and test gait. Patient received in sign out from Dr. Rogene [place] MRI back in normal.  Patient to be ambulated and plan discharge    Pattricia Boss, MD 04/13/19 [phone]

## 2019-05-31 ENCOUNTER — Other Ambulatory Visit: Payer: Self-pay

## 2019-05-31 ENCOUNTER — Emergency Department (HOSPITAL_COMMUNITY)
Admission: EM | Admit: 2019-05-31 | Discharge: 2019-06-01 | Disposition: A | Discharge status: Other Acute Inpatient Hospital | Attending: Emergency Medicine | Admitting: Emergency Medicine

## 2019-05-31 ENCOUNTER — Encounter (HOSPITAL_COMMUNITY): Payer: Self-pay | Admitting: *Deleted

## 2019-05-31 ENCOUNTER — Emergency Department (HOSPITAL_COMMUNITY)

## 2019-05-31 DIAGNOSIS — I252 Old myocardial infarction: Secondary | ICD-10-CM | POA: Diagnosis not present

## 2019-05-31 DIAGNOSIS — R1011 Right upper quadrant pain: Secondary | ICD-10-CM | POA: Diagnosis present

## 2019-05-31 DIAGNOSIS — R51 Headache: Secondary | ICD-10-CM | POA: Diagnosis not present

## 2019-05-31 DIAGNOSIS — R197 Diarrhea, unspecified: Secondary | ICD-10-CM | POA: Diagnosis not present

## 2019-05-31 DIAGNOSIS — R0602 Shortness of breath: Secondary | ICD-10-CM | POA: Insufficient documentation

## 2019-05-31 DIAGNOSIS — R45851 Suicidal ideations: Secondary | ICD-10-CM | POA: Diagnosis not present

## 2019-05-31 DIAGNOSIS — F250 Schizoaffective disorder, bipolar type: Secondary | ICD-10-CM | POA: Diagnosis not present

## 2019-05-31 DIAGNOSIS — F17210 Nicotine dependence, cigarettes, uncomplicated: Secondary | ICD-10-CM | POA: Insufficient documentation

## 2019-05-31 DIAGNOSIS — R05 Cough: Secondary | ICD-10-CM | POA: Insufficient documentation

## 2019-05-31 DIAGNOSIS — R112 Nausea with vomiting, unspecified: Secondary | ICD-10-CM | POA: Diagnosis not present

## 2019-05-31 DIAGNOSIS — Z20828 Contact with and (suspected) exposure to other viral communicable diseases: Secondary | ICD-10-CM | POA: Insufficient documentation

## 2019-05-31 DIAGNOSIS — R519 Headache, unspecified: Secondary | ICD-10-CM

## 2019-05-31 DIAGNOSIS — R0789 Other chest pain: Secondary | ICD-10-CM | POA: Diagnosis not present

## 2019-05-31 DIAGNOSIS — R1084 Generalized abdominal pain: Secondary | ICD-10-CM | POA: Insufficient documentation

## 2019-05-31 LAB — CBC
HCT: 39 % (ref 36.0–46.0)
Hemoglobin: 12.7 g/dL (ref 12.0–15.0)
MCH: 28.3 pg (ref 26.0–34.0)
MCHC: 32.6 g/dL (ref 30.0–36.0)
MCV: 87.1 fL (ref 80.0–100.0)
Platelets: 292 10*3/uL (ref 150–400)
RBC: 4.48 MIL/uL (ref 3.87–5.11)
RDW: 16.1 % — ABNORMAL HIGH (ref 11.5–15.5)
WBC: 17.4 10*3/uL — ABNORMAL HIGH (ref 4.0–10.5)
nRBC: 0 % (ref 0.0–0.2)

## 2019-05-31 LAB — COMPREHENSIVE METABOLIC PANEL
ALT: 19 U/L (ref 0–44)
AST: 24 U/L (ref 15–41)
Albumin: 4 g/dL (ref 3.5–5.0)
Alkaline Phosphatase: 67 U/L (ref 38–126)
Anion gap: 16 — ABNORMAL HIGH (ref 5–15)
BUN: 7 mg/dL (ref 6–20)
CO2: 19 mmol/L — ABNORMAL LOW (ref 22–32)
Calcium: 9.4 mg/dL (ref 8.9–10.3)
Chloride: 101 mmol/L (ref 98–111)
Creatinine, Ser: 0.89 mg/dL (ref 0.44–1.00)
GFR calc Af Amer: >60 mL/min (ref >60)
GFR calc non Af Amer: >60 mL/min (ref >60)
Glucose, Bld: 88 mg/dL (ref 70–99)
Potassium: 3.5 mmol/L (ref 3.5–5.1)
Sodium: 136 mmol/L (ref 135–145)
Total Bilirubin: 0.8 mg/dL (ref 0.3–1.2)
Total Protein: 7.7 g/dL (ref 6.5–8.1)

## 2019-05-31 LAB — LIPASE, BLOOD: Lipase: 19 U/L (ref 11–51)

## 2019-05-31 IMAGING — CT CT HEAD WITHOUT CONTRAST
4 series · 16 of 47 positions shown, 18 images · non-contrast
Comparison: [DATE]

CLINICAL DATA: Headaches and photophobia

EXAM:
CT HEAD WITHOUT CONTRAST
TECHNIQUE: Contiguous axial images were obtained from the base of the skull
through the vertex without intravenous contrast.

[Series 3: head without · axial · non-contrast · 0.42mm/px · z∈[-77,+38]mm · 7 of 31 slices shown, 9 images]
[im 4/31  brain]
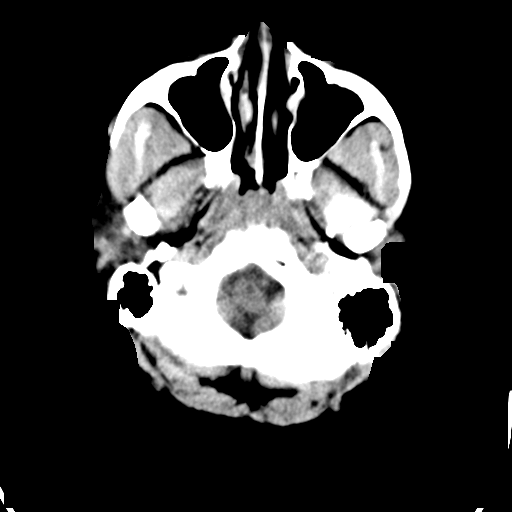
[im 4/31  bone]
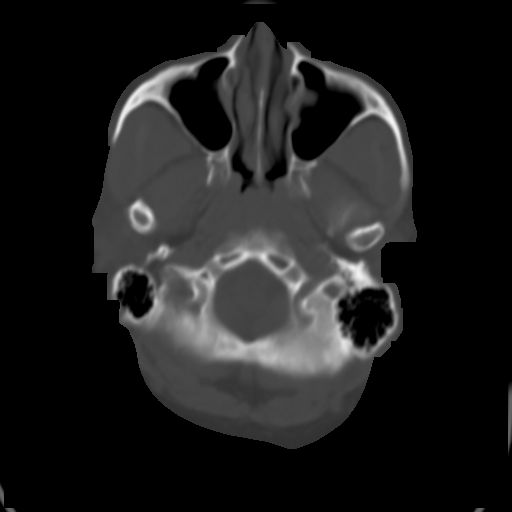
[im 8/31  brain]
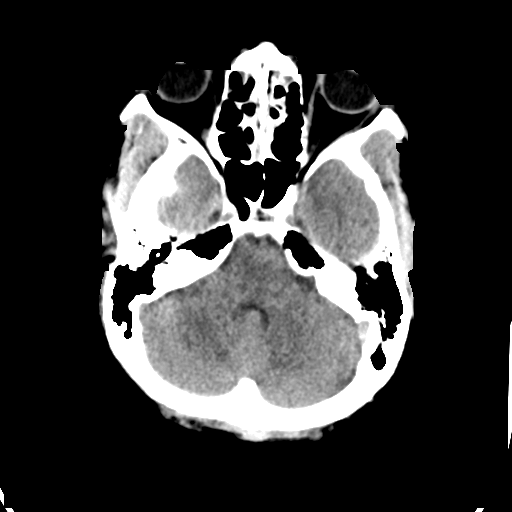
[im 12/31  brain]
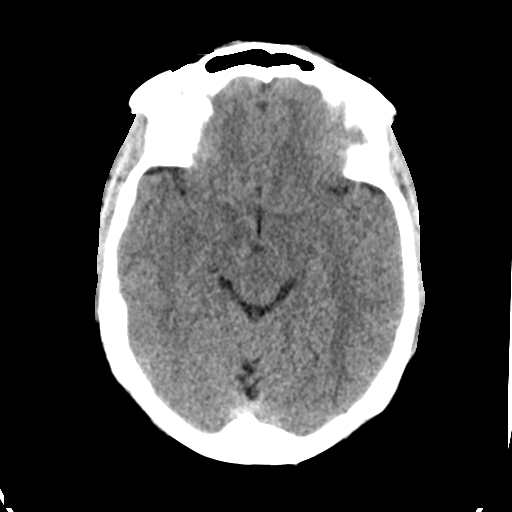
[im 16/31  brain]
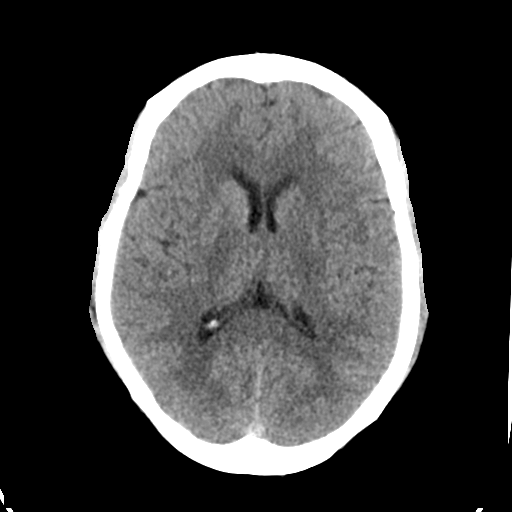
[im 19/31  brain]
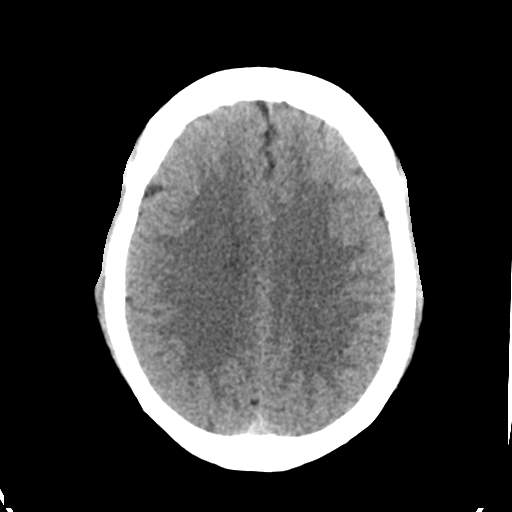
[im 19/31  bone]
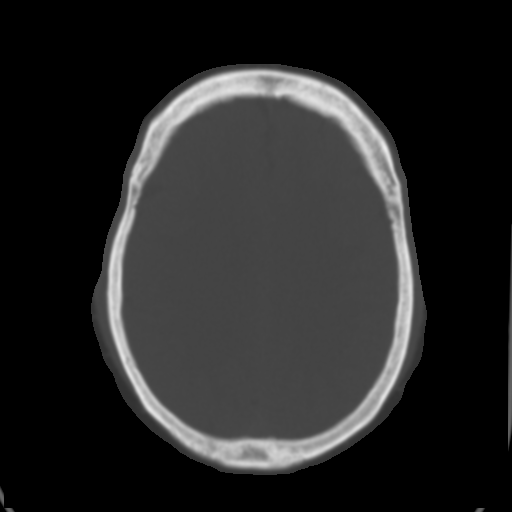
[im 23/31  brain]
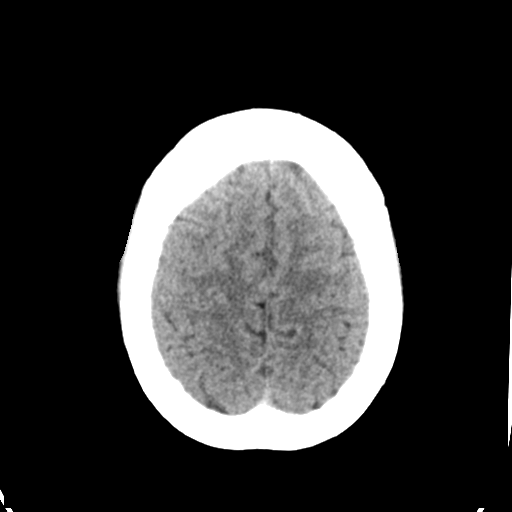
[im 27/31  brain]
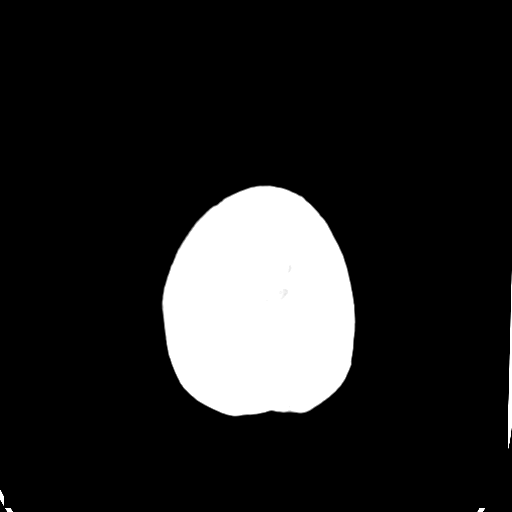

[Series 4: head bone · axial · 0.42mm/px · z∈[-78,-48]mm · 3 of 77 slices shown]
[im 8/77  bone]
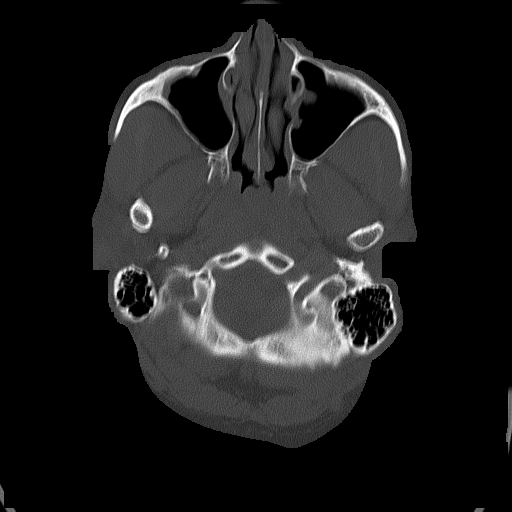
[im 16/77  bone]
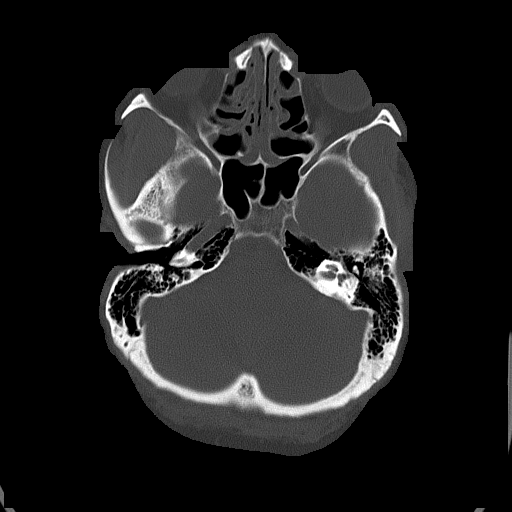
[im 23/77  bone]
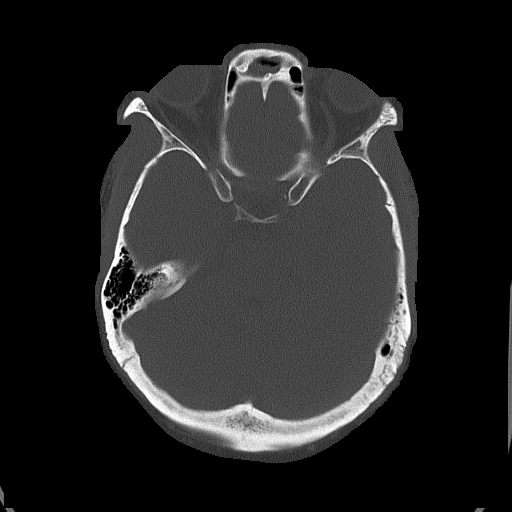

[Series 5: head without cor · coronal · non-contrast · 0.31mm/px · 3 of 67 slices shown]
[im 23/67  brain]
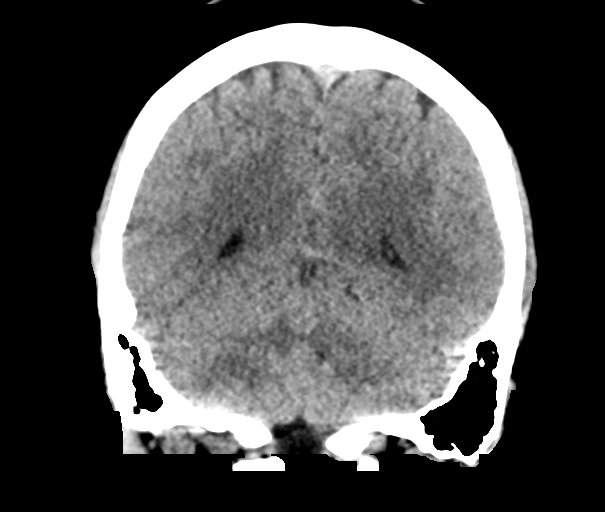
[im 30/67  brain]
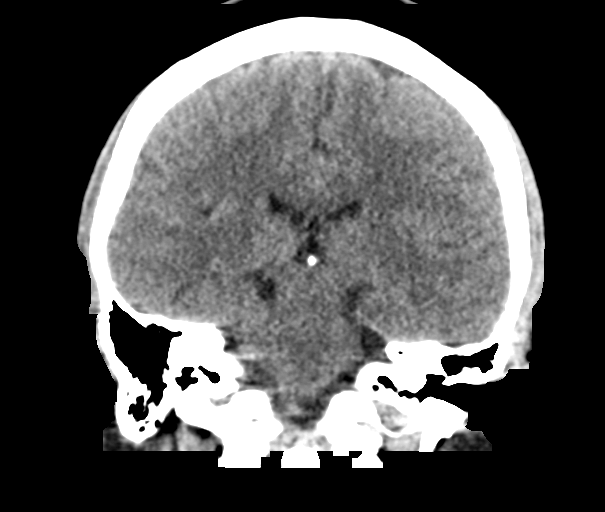
[im 37/67  brain]
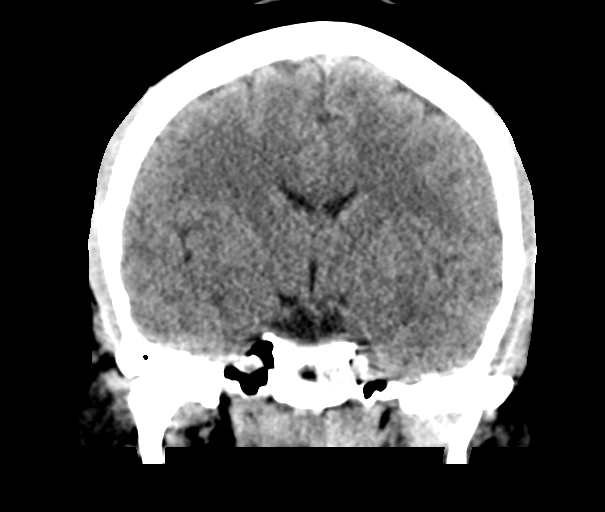

[Series 6: head without sag · sagittal · non-contrast · 0.31mm/px · 3 of 55 slices shown]
[im 19/55  brain]
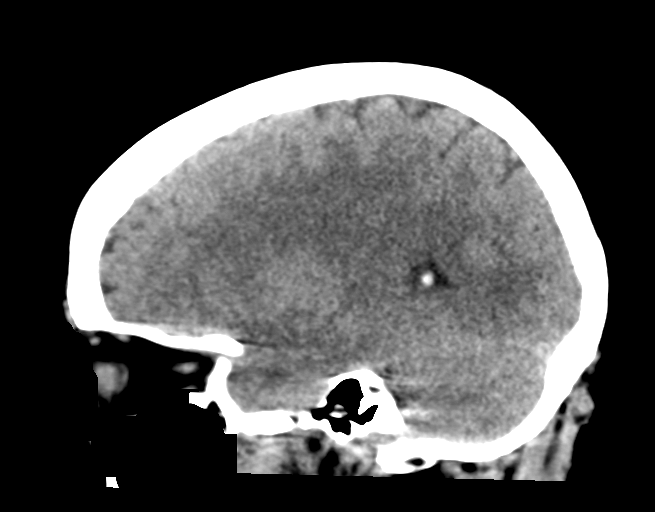
[im 28/55  brain]
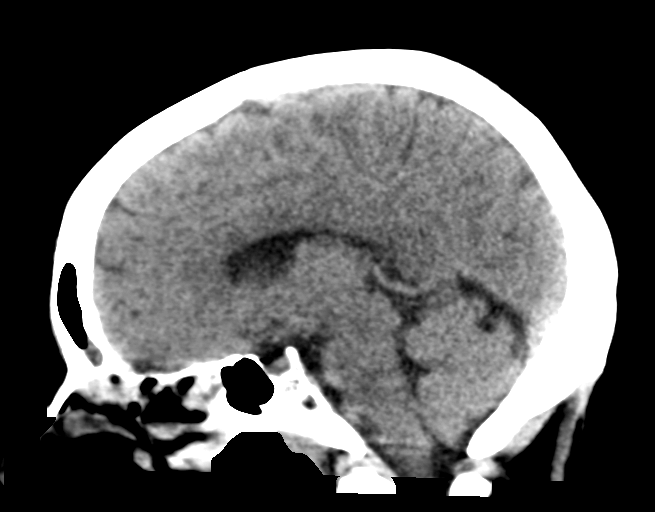
[im 37/55  brain]
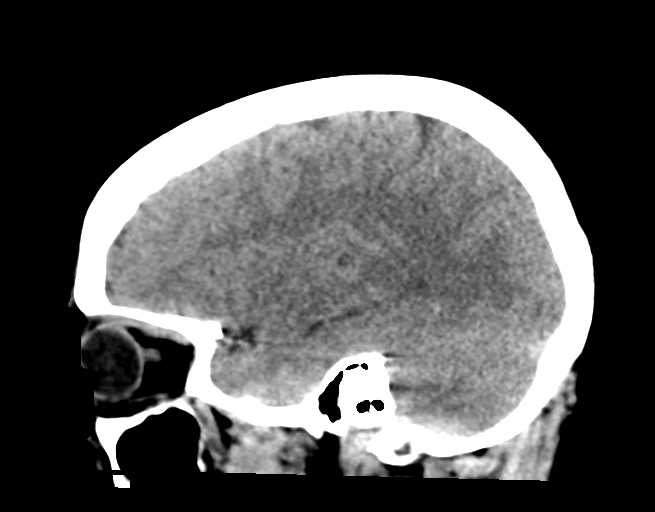

[16 of 47 positions shown; findings below may reference images not displayed]

FINDINGS: Brain: No evidence of acute infarction, hemorrhage, hydrocephalus,
extra-axial collection or mass lesion/mass effect.

Vascular: No hyperdense vessel or unexpected calcification.

Skull: Normal. Negative for fracture or focal lesion.

Sinuses/Orbits: No acute finding.

Other: None.
IMPRESSION: No acute intracranial abnormality is noted.

## 2019-05-31 MED ORDER — SODIUM CHLORIDE 0.9% FLUSH: 3.0000 mL | Freq: Once | INTRAVENOUS | Status: DC

## 2019-05-31 NOTE — ED Triage Notes (Signed)
Pt arrived by EMS for sudden onset headache an hour ago, reports worse headache shes ever had with abd pain. Reports blurred vision and photophobia

## 2019-06-01 ENCOUNTER — Emergency Department (HOSPITAL_COMMUNITY)

## 2019-06-01 ENCOUNTER — Encounter (HOSPITAL_COMMUNITY): Payer: Self-pay

## 2019-06-01 ENCOUNTER — Other Ambulatory Visit: Payer: Self-pay

## 2019-06-01 ENCOUNTER — Inpatient Hospital Stay (HOSPITAL_COMMUNITY)
Admission: AD | Admit: 2019-06-01 | Discharge: 2019-06-04 | DRG: 885 | Disposition: A | Discharge status: Home / Self Care | Payer: Federal, State, Local not specified - Other | Source: Intra-hospital | Attending: Psychiatry | Admitting: Psychiatry

## 2019-06-01 ENCOUNTER — Other Ambulatory Visit: Payer: Self-pay | Admitting: Registered Nurse

## 2019-06-01 DIAGNOSIS — F1410 Cocaine abuse, uncomplicated: Secondary | ICD-10-CM | POA: Diagnosis present

## 2019-06-01 DIAGNOSIS — Z886 Allergy status to analgesic agent status: Secondary | ICD-10-CM | POA: Diagnosis not present

## 2019-06-01 DIAGNOSIS — F333 Major depressive disorder, recurrent, severe with psychotic symptoms: Principal | ICD-10-CM | POA: Diagnosis present

## 2019-06-01 DIAGNOSIS — F4310 Post-traumatic stress disorder, unspecified: Secondary | ICD-10-CM | POA: Diagnosis present

## 2019-06-01 DIAGNOSIS — I10 Essential (primary) hypertension: Secondary | ICD-10-CM | POA: Diagnosis present

## 2019-06-01 DIAGNOSIS — F1110 Opioid abuse, uncomplicated: Secondary | ICD-10-CM | POA: Diagnosis present

## 2019-06-01 DIAGNOSIS — Z915 Personal history of self-harm: Secondary | ICD-10-CM | POA: Diagnosis not present

## 2019-06-01 DIAGNOSIS — F17210 Nicotine dependence, cigarettes, uncomplicated: Secondary | ICD-10-CM | POA: Diagnosis present

## 2019-06-01 DIAGNOSIS — R1084 Generalized abdominal pain: Secondary | ICD-10-CM | POA: Diagnosis not present

## 2019-06-01 DIAGNOSIS — I340 Nonrheumatic mitral (valve) insufficiency: Secondary | ICD-10-CM | POA: Diagnosis present

## 2019-06-01 DIAGNOSIS — Z590 Homelessness: Secondary | ICD-10-CM

## 2019-06-01 DIAGNOSIS — Z885 Allergy status to narcotic agent status: Secondary | ICD-10-CM | POA: Diagnosis not present

## 2019-06-01 DIAGNOSIS — Z79899 Other long term (current) drug therapy: Secondary | ICD-10-CM

## 2019-06-01 DIAGNOSIS — Z91410 Personal history of adult physical and sexual abuse: Secondary | ICD-10-CM | POA: Diagnosis not present

## 2019-06-01 DIAGNOSIS — R45851 Suicidal ideations: Secondary | ICD-10-CM | POA: Diagnosis present

## 2019-06-01 DIAGNOSIS — J45909 Unspecified asthma, uncomplicated: Secondary | ICD-10-CM | POA: Diagnosis present

## 2019-06-01 LAB — D-DIMER, QUANTITATIVE: D-Dimer, Quant: 0.48 ug/mL-FEU (ref 0.00–0.50)

## 2019-06-01 LAB — CSF CELL COUNT WITH DIFFERENTIAL
RBC Count, CSF: 0 /mm3
RBC Count, CSF: 0 /mm3
Tube #: 1
Tube #: 4
WBC, CSF: 1 /mm3 (ref 0–5)
WBC, CSF: 1 /mm3 (ref 0–5)

## 2019-06-01 LAB — TROPONIN I (HIGH SENSITIVITY)
Troponin I (High Sensitivity): 10 ng/L (ref <18)
Troponin I (High Sensitivity): 9 ng/L (ref <18)

## 2019-06-01 LAB — SARS CORONAVIRUS 2 BY RT PCR (HOSPITAL ORDER, PERFORMED IN ~~LOC~~ HOSPITAL LAB): SARS Coronavirus 2: NEGATIVE

## 2019-06-01 LAB — I-STAT BETA HCG BLOOD, ED (MC, WL, AP ONLY): I-stat hCG, quantitative: <5 m[IU]/mL (ref <5)

## 2019-06-01 LAB — PROTEIN AND GLUCOSE, CSF
Glucose, CSF: 62 mg/dL (ref 40–70)
Total  Protein, CSF: 23 mg/dL (ref 15–45)

## 2019-06-01 IMAGING — DX PORTABLE CHEST - 1 VIEW
1 series · 1 of 1 positions shown · non-contrast
Comparison: Radiograph [DATE].  Chest CT [DATE]

CLINICAL DATA: Shortness of breath.

EXAM:
PORTABLE CHEST 1 VIEW

[chest ap]
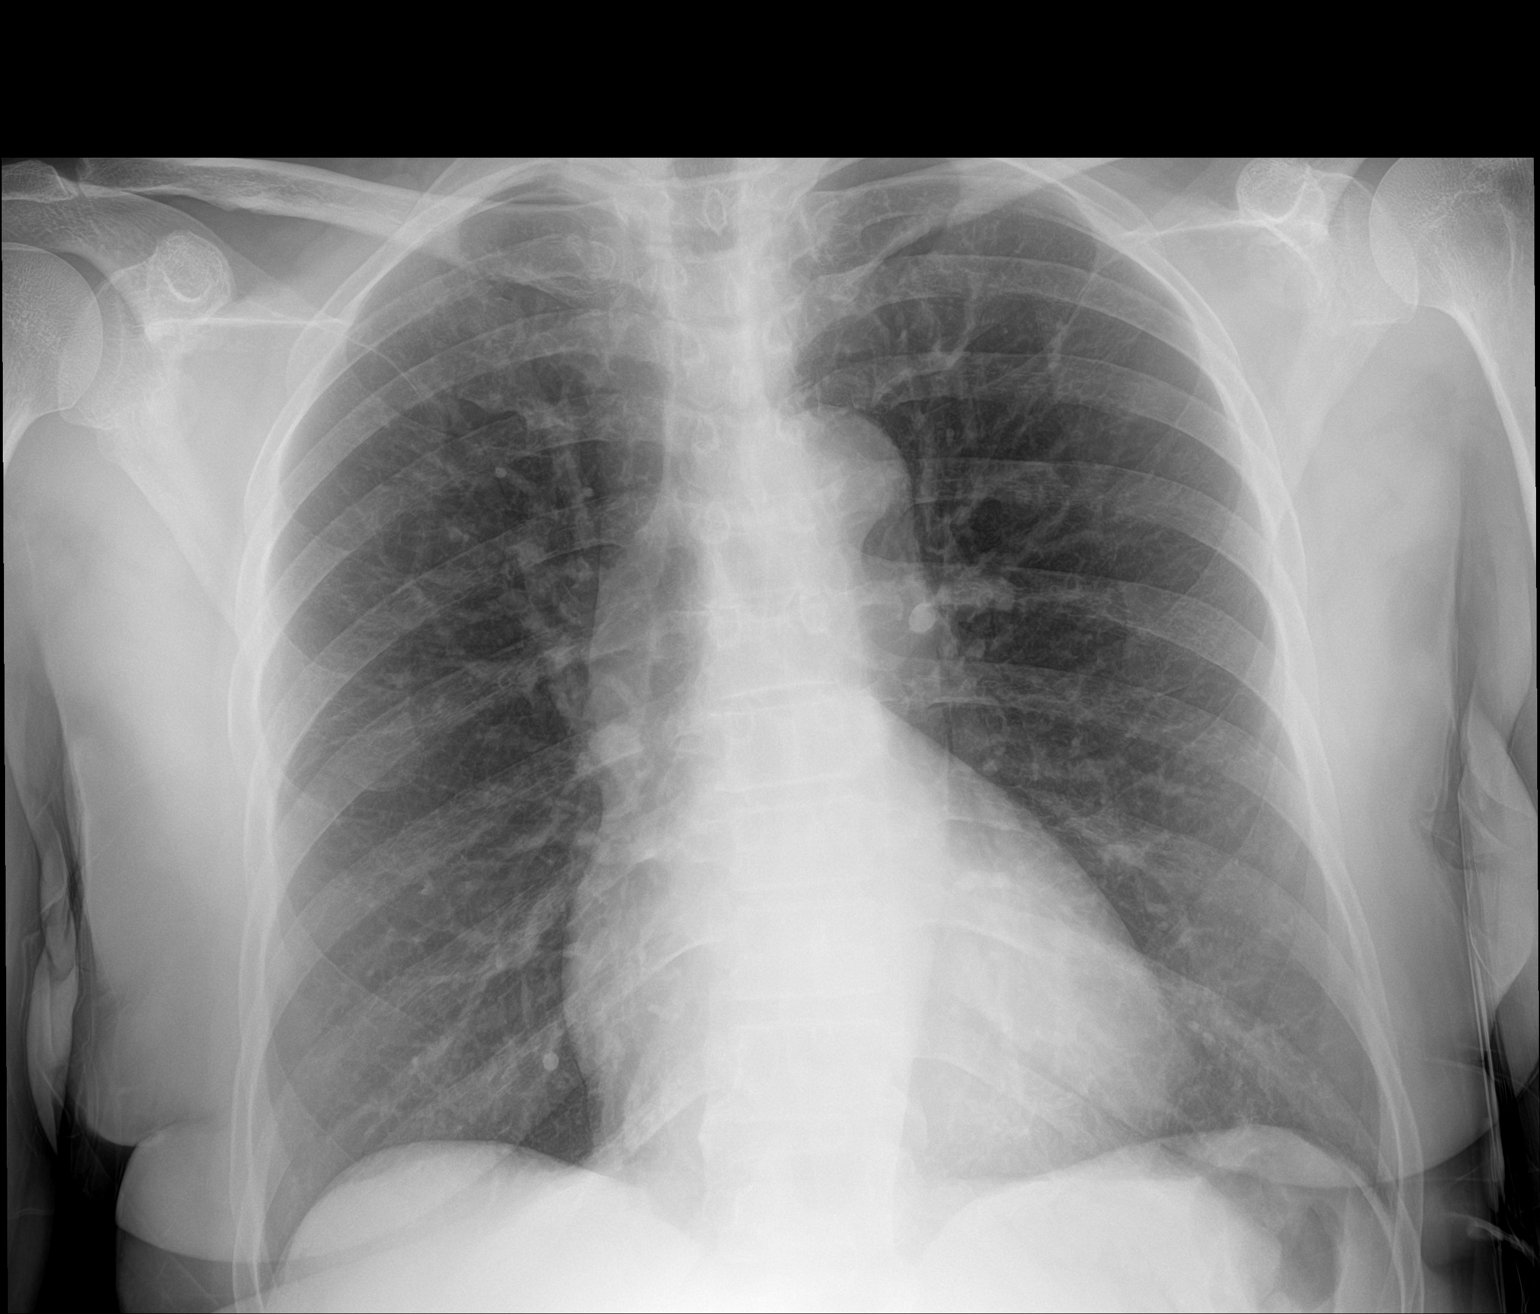

[1 of 1 positions shown; findings below may reference images not displayed]

FINDINGS: The cardiomediastinal contours are normal. The lungs are clear.
Pulmonary vasculature is normal. No consolidation, pleural effusion,
or pneumothorax. No acute osseous abnormalities are seen.
IMPRESSION: No acute chest finding.

## 2019-06-01 MED ORDER — TRAZODONE HCL 100 MG PO TABS
100.0000 mg | ORAL_TABLET | Freq: Every evening | ORAL | Status: DC | PRN
  Administered 2019-06-01 – 2019-06-04 (×4): 100 mg via ORAL
  Filled 2019-06-01 (×12): qty 1

## 2019-06-01 MED ORDER — IOHEXOL 300 MG/ML  SOLN
100.0000 mL | Freq: Once | INTRAMUSCULAR | Status: AC | PRN
  Administered 2019-06-01: 100 mL via INTRAVENOUS

## 2019-06-01 MED ORDER — ALUM & MAG HYDROXIDE-SIMETH 200-200-20 MG/5ML PO SUSP: 30.0000 mL | Freq: Four times a day (QID) | ORAL | Status: DC | PRN

## 2019-06-01 MED ORDER — ACETAMINOPHEN 325 MG PO TABS
650.0000 mg | ORAL_TABLET | ORAL | Status: DC | PRN
  Administered 2019-06-01: 650 mg via ORAL
  Filled 2019-06-01: qty 2

## 2019-06-01 MED ORDER — ACETAMINOPHEN 325 MG PO TABS
650.0000 mg | ORAL_TABLET | Freq: Four times a day (QID) | ORAL | Status: DC | PRN
  Administered 2019-06-01 – 2019-06-02 (×2): 650 mg via ORAL
  Filled 2019-06-01 (×2): qty 2

## 2019-06-01 MED ORDER — METHYLPREDNISOLONE SODIUM SUCC 125 MG IJ SOLR
80.0000 mg | Freq: Once | INTRAMUSCULAR | Status: AC
  Administered 2019-06-01: 80 mg via INTRAVENOUS
  Filled 2019-06-01: qty 2

## 2019-06-01 MED ORDER — ALUM & MAG HYDROXIDE-SIMETH 200-200-20 MG/5ML PO SUSP
30.0000 mL | ORAL | Status: DC | PRN
  Administered 2019-06-02: 30 mL via ORAL
  Filled 2019-06-01: qty 30

## 2019-06-01 MED ORDER — ZOLPIDEM TARTRATE 5 MG PO TABS: 5.0000 mg | ORAL_TABLET | Freq: Every evening | ORAL | Status: DC | PRN

## 2019-06-01 MED ORDER — LIDOCAINE HCL 2 % IJ SOLN
10.0000 mL | Freq: Once | INTRAMUSCULAR | Status: DC
  Filled 2019-06-01: qty 20

## 2019-06-01 MED ORDER — HYDROXYZINE HCL 25 MG PO TABS
25.0000 mg | ORAL_TABLET | Freq: Three times a day (TID) | ORAL | Status: DC | PRN
  Administered 2019-06-01 – 2019-06-04 (×2): 25 mg via ORAL
  Filled 2019-06-01 (×2): qty 1

## 2019-06-01 MED ORDER — ALBUTEROL SULFATE HFA 108 (90 BASE) MCG/ACT IN AERS
4.0000 | INHALATION_SPRAY | Freq: Once | RESPIRATORY_TRACT | Status: AC
  Administered 2019-06-01: 4 via RESPIRATORY_TRACT
  Filled 2019-06-01: qty 6.7

## 2019-06-01 MED ORDER — MAGNESIUM HYDROXIDE 400 MG/5ML PO SUSP
30.0000 mL | Freq: Every day | ORAL | Status: DC | PRN
  Administered 2019-06-02: 30 mL via ORAL
  Filled 2019-06-01: qty 30

## 2019-06-01 MED ORDER — PANTOPRAZOLE SODIUM 40 MG PO TBEC
40.0000 mg | DELAYED_RELEASE_TABLET | Freq: Every day | ORAL | Status: DC
  Administered 2019-06-01: 40 mg via ORAL
  Filled 2019-06-01: qty 1

## 2019-06-01 MED ORDER — PREDNISONE 50 MG PO TABS: ORAL_TABLET | ORAL | 0 refills | Status: DC

## 2019-06-01 MED ORDER — ONDANSETRON HCL 4 MG PO TABS: 4.0000 mg | ORAL_TABLET | Freq: Three times a day (TID) | ORAL | Status: DC | PRN

## 2019-06-01 MED ORDER — ALBUTEROL SULFATE HFA 108 (90 BASE) MCG/ACT IN AERS: 2.0000 | INHALATION_SPRAY | Freq: Four times a day (QID) | RESPIRATORY_TRACT | Status: DC | PRN

## 2019-06-01 MED ORDER — NICOTINE 21 MG/24HR TD PT24
21.0000 mg | MEDICATED_PATCH | Freq: Every day | TRANSDERMAL | Status: DC
  Administered 2019-06-01: 21 mg via TRANSDERMAL
  Filled 2019-06-01: qty 1

## 2019-06-01 NOTE — ED Notes (Signed)
Patients belongs are in the locker#3

## 2019-06-01 NOTE — BH Assessment (Addendum)
[facility] [place] Assessment Progress Note   Per Priscille Loveless, NP, patient is recommended for inpatient treatment due to her suicidal ideation and plan.

## 2019-06-01 NOTE — ED Notes (Signed)
Patient transported to CT 

## 2019-06-01 NOTE — ED Notes (Signed)
Pt was able to ambulate down hall while maintainin O2 saturation no less than 94%.

## 2019-06-01 NOTE — ED Triage Notes (Signed)
Pt was asleep on bench, did not hear her name

## 2019-06-01 NOTE — ED Notes (Signed)
US at bedside
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
## 2019-06-01 NOTE — ED Notes (Signed)
Black [place]lett is in security office lock box.

## 2019-06-01 NOTE — ED Notes (Signed)
TTS at bedside. 

## 2019-06-01 NOTE — ED Provider Notes (Signed)
[facility] EMERGENCY DEPARTMENT Provider Note   CSN: [id number] Arrival date & time: 05/31/19  2001     History   Chief Complaint Chief Complaint  Patient presents with   Headache    HPI Denise Knight is a 53 y.o. female.     Level 5 caveat for difficulty breathing.  Patient with history of asthma, bipolar disorder, hypertension, schizophrenia presenting with multiple complaints.  Triage note states she arrived by EMS for headache.  Patient states she is actually here because is been having abdominal pain for the past 4 days with diarrhea which is been yellow in color pain intermittent nausea and vomiting.  She describes diffuse pain in her upper abdomen that is worse with palpation.  She denies fevers or chills.  She states today she developed shortness of breath with cough productive of green and yellow mucus and shortness of breath with chest pain with coughing.  Also developed gradual onset headache, contrary to triage note is not thunderclap onset.  She reports feeling short of breath and having pain in her abdomen as well as having a headache that is gradually worsening.  No focal weakness.  No difficulty swallowing.  Denies any back pain.  Denies any pain with urination or blood in the urine.  The history is provided by the patient.  Headache Associated symptoms: abdominal pain, cough, diarrhea, nausea, vomiting and weakness   Associated symptoms: no congestion, no fever and no myalgias     Past Medical History:  Diagnosis Date   Anxiety    Arthritis    "left knee" (07/05/2016)   Asthma    Bipolar 1 disorder (HCC)    Depression    GERD (gastroesophageal reflux disease)    Hypertension    MI (mitral incompetence)    Post traumatic stress disorder    Schizophrenia ([place])    Stomach ulcer     Patient Active Problem List   Diagnosis Date Noted   Cocaine abuse ([place]) 12/27/2017   Rectal bleeding 12/27/2017   NSTEMI (non-ST  elevated myocardial infarction) ([place]) 12/23/2017   Acute viral bronchitis 06/14/2017   Asthma exacerbation 06/14/2017   Chest pain 07/05/2016    Past Surgical History:  Procedure Laterality Date   CARDIAC CATHETERIZATION  07/05/2016   CARDIAC CATHETERIZATION N/A 07/05/2016   Procedure: Left Heart Cath and Coronary Angiography;  Surgeon: Adrian Prows, MD;  Location: [place] [facility];  Service: Cardiovascular;  Laterality: N/A;   CYST EXCISION Right    "wrist"   DILATION AND CURETTAGE OF UTERUS     FOOT SURGERY Bilateral    "corns removed"   LEFT HEART CATH AND CORONARY ANGIOGRAPHY N/A 12/24/2017   Procedure: LEFT HEART CATH AND CORONARY ANGIOGRAPHY;  Surgeon: Nigel Mormon, MD;  Location: [place] [facility];  Service: Cardiovascular;  Laterality: N/A;   TUBAL LIGATION       OB History   No obstetric history on file.      Home Medications    Prior to Admission medications   Medication Sig Start Date End Date Taking? Authorizing Provider  albuterol (PROVENTIL HFA;VENTOLIN HFA) 108 (90 BASE) MCG/ACT inhaler Inhale 2 puffs into the lungs every 6 (six) hours as needed for wheezing or shortness of breath.    [provider]  amLODipine (NORVASC) 5 MG tablet Take 1 tablet (5 mg total) by mouth daily. Patient not taking: Reported on 04/13/2019 06/10/18   Geradine Girt, DO  ciprofloxacin (CIPRO) 500 MG tablet Take 1  tablet (500 mg total) by mouth 2 (two) times daily. One po bid x 7 days Patient not taking: Reported on 04/13/2019 09/18/18   Domenic Moras, PA-C  hydrocortisone (ANUSOL-HC) 25 MG suppository Place 1 suppository (25 mg total) rectally 2 (two) times daily. For 7 days Patient not taking: Reported on 04/13/2019 09/18/18   Domenic Moras, PA-C  loratadine (CLARITIN) 10 MG tablet Take 1 tablet (10 mg total) by mouth daily. Patient not taking: Reported on 06/26/2018 06/16/17   Thomasene Ripple, MD  metroNIDAZOLE (FLAGYL) 500 MG tablet Take 1 tablet (500 mg total) by mouth  2 (two) times daily. Patient not taking: Reported on 04/13/2019 09/18/18   Domenic Moras, PA-C  nitroGLYCERIN (NITROSTAT) 0.4 MG SL tablet Place 1 tablet (0.4 mg total) under the tongue every 5 (five) minutes x 3 doses as needed for chest pain. Patient not taking: Reported on 04/13/2019 02/11/18   Frederica Kuster, PA-C  omeprazole (PRILOSEC) 20 MG capsule Take 20 mg by mouth daily.     [provider]    Family History Family History  Problem Relation Age of Onset   Breast cancer Other    Lung cancer Other    Congestive Heart Failure Other    Diabetes Other    Hypertension Other     Social History Social History   Tobacco Use   Smoking status: Current Some Day Smoker    Packs/day: 0.50    Years: 35.00    Pack years: 17.50    Types: Cigarettes   Smokeless tobacco: Never Used  Substance Use Topics   Alcohol use: Yes    Alcohol/week: 7.0 standard drinks    Types: 7 Cans of beer per week    Comment: socially   Drug use: Yes    Types: Cocaine    Comment: 2 days     Allergies   Aspirin, Food, and Morphine and related   Review of Systems Review of Systems  Constitutional: Positive for activity change and appetite change. Negative for fever.  HENT: Negative for congestion.   Respiratory: Positive for cough, chest tightness and shortness of breath.   Cardiovascular: Negative for chest pain.  Gastrointestinal: Positive for abdominal pain, diarrhea, nausea and vomiting.  Genitourinary: Negative for dysuria and hematuria.  Musculoskeletal: Negative for arthralgias and myalgias.  Skin: Negative for rash.  Neurological: Positive for weakness and headaches.   all other systems are negative except as noted in the HPI and PMH.     Physical Exam Updated Vital Signs BP 116/70    Pulse 86    Temp 98.4 F (36.9 C) (Oral)    Resp 16    SpO2 96%   Physical Exam Vitals signs and nursing note reviewed.  Constitutional:      General: She is in acute distress.      Appearance: She is well-developed. She is ill-appearing.     Comments: Respiratory distress, speaking short phrases  HENT:     Head: Normocephalic and atraumatic.     Mouth/Throat:     Pharynx: No oropharyngeal exudate.  Eyes:     Conjunctiva/sclera: Conjunctivae normal.     Pupils: Pupils are equal, round, and reactive to light.  Neck:     Musculoskeletal: Normal range of motion and neck supple.     Comments: No meningismus. Cardiovascular:     Rate and Rhythm: Normal rate and regular rhythm.     Heart sounds: Normal heart sounds. No murmur.  Pulmonary:     Effort: Pulmonary effort  is normal. No respiratory distress.     Breath sounds: Wheezing present.     Comments: Diminished with expiratory wheezing bilaterally Abdominal:     Palpations: Abdomen is soft.     Tenderness: There is abdominal tenderness. There is guarding. There is no rebound.     Comments: Epigastric and right upper quadrant tenderness with guarding  Musculoskeletal: Normal range of motion.        General: No tenderness.     Comments: No CVA tenderness  Skin:    General: Skin is warm.     Capillary Refill: Capillary refill takes less than 2 seconds.  Neurological:     General: No focal deficit present.     Mental Status: She is alert and oriented to person, place, and time. Mental status is at baseline.     Cranial Nerves: No cranial nerve deficit.     Motor: No abnormal muscle tone.     Coordination: Coordination normal.     Comments:  5/5 strength throughout. CN 2-12 intact.Equal grip strength.   Psychiatric:        Behavior: Behavior normal.      ED Treatments / Results  Labs (all labs ordered are listed, but only abnormal results are displayed) Labs Reviewed  COMPREHENSIVE METABOLIC PANEL - Abnormal; Notable for the following components:      Result Value   CO2 19 (*)    Anion gap 16 (*)    All other components within normal limits  CBC - Abnormal; Notable for the following components:   WBC  17.4 (*)    RDW 16.1 (*)    All other components within normal limits  SARS CORONAVIRUS 2 (HOSPITAL ORDER, PERFORMED [place] [facility])  CSF CULTURE  LIPASE, BLOOD  D-DIMER, QUANTITATIVE (NOT AT [facility])  URINALYSIS, ROUTINE W REFLEX MICROSCOPIC  RAPID URINE DRUG SCREEN, HOSP PERFORMED  CSF CELL COUNT WITH DIFFERENTIAL  CSF CELL COUNT WITH DIFFERENTIAL  PROTEIN AND GLUCOSE, CSF  I-STAT BETA HCG BLOOD, ED ([facility], [facility], [facility] ONLY)  TROPONIN I (HIGH SENSITIVITY)  TROPONIN I (HIGH SENSITIVITY)    EKG EKG Interpretation  Date/Time:  Monday May 31 2019 20:18:11 EDT Ventricular Rate:  99 PR Interval:  148 QRS Duration: 64 QT Interval:  374 QTC Calculation: 479 R Axis:   64 Text Interpretation:  Normal sinus rhythm Normal ECG No significant change was found Confirmed by Ezequiel Essex [phone]) on 06/01/2019 1:59:00 AM   Radiology Ct Angio Head W Or Wo Contrast  Result Date: 06/01/2019 CLINICAL DATA:  Acute headache EXAM: CT ANGIOGRAPHY HEAD TECHNIQUE: Multidetector CT imaging of the head was performed using the standard protocol during bolus administration of intravenous contrast. Multiplanar CT image reconstructions and MIPs were obtained to evaluate the vascular anatomy. CONTRAST:  153m OMNIPAQUE IOHEXOL 300 MG/ML  SOLN COMPARISON:  Noncontrast head CT earlier today. Brain MRI 04/13/2019 FINDINGS: CTA HEAD Anterior circulation: Mild calcification along the left carotid siphon. No branch occlusion or flow limiting stenosis. No beading or evidence of vascular malformation. There is a 2 mm posterior and inferior outpouching from the left supraclinoid ICA. Posterior circulation: Left dominant vertebral artery. Only the left vertebral continues to the basilar. Vessels are smooth and diffusely patent. Negative for aneurysm. Venous sinuses: Unremarkable in the arterial phase Anatomic variants: Fetal type PCA on the right. Delayed phase: Not obtained. Poor vessel detail in a setting of motion.  IMPRESSION: 1. Motion degraded study without emergent finding. 2. 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 Electronically Signed   By: Monte Fantasia M.D.   On: 06/01/2019 06:04   Ct Head Wo Contrast  Result Date: 05/31/2019 CLINICAL DATA:  Headaches and photophobia EXAM: CT HEAD WITHOUT CONTRAST TECHNIQUE: Contiguous axial images were obtained from the base of the skull through the vertex without intravenous contrast. COMPARISON:  08/18/2017 FINDINGS: Brain: No evidence of acute infarction, hemorrhage, hydrocephalus, extra-axial collection or mass lesion/mass effect. Vascular: No hyperdense vessel or unexpected calcification. Skull: Normal. Negative for fracture or focal lesion. Sinuses/Orbits: No acute finding. Other: None. IMPRESSION: No acute intracranial abnormality is noted. Electronically Signed   By: Inez Catalina M.D.   On: 05/31/2019 21:37   Ct Abdomen Pelvis W Contrast  Result Date: 06/01/2019 CLINICAL DATA:  Acute abdominal pain EXAM: CT ABDOMEN AND PELVIS WITH CONTRAST TECHNIQUE: Multidetector CT imaging of the abdomen and pelvis was performed using the standard protocol following bolus administration of intravenous contrast. CONTRAST:  136m OMNIPAQUE IOHEXOL 300 MG/ML  SOLN COMPARISON:  10/06/2018 FINDINGS: Lower chest:  No contributory findings. Hepatobiliary: No focal liver abnormality.No evidence of biliary obstruction or stone. Pancreas: Unremarkable. Spleen: Unremarkable. Adrenals/Urinary Tract: Negative adrenals. No hydronephrosis or stone. Unremarkable bladder. Stomach/Bowel:  No obstruction. No appendicitis. Vascular/Lymphatic: No acute vascular abnormality. Atheromatous plaque at the aortic bifurcation. No mass or adenopathy. Reproductive:Lobulated uterus from fibroids which are difficult to discretely measure due to indistinct appearance. No acute finding. Other: No ascites or pneumoperitoneum. Musculoskeletal: No acute abnormalities. IMPRESSION: 1. No acute  finding or explanation for abdominal pain. 2. Atherosclerosis. 3. Fibroid uterus. Electronically Signed   By: JMonte FantasiaM.D.   On: 06/01/2019 05:33   Dg Chest Portable 1 View  Result Date: 06/01/2019 CLINICAL DATA:  Shortness of breath. EXAM: PORTABLE CHEST 1 VIEW COMPARISON:  Radiograph 04/13/2019.  Chest CT 10/06/2018 FINDINGS: The cardiomediastinal contours are normal. The lungs are clear. Pulmonary vasculature is normal. No consolidation, pleural effusion, or pneumothorax. No acute osseous abnormalities are seen. IMPRESSION: No acute chest finding. Electronically Signed   By: MKeith RakeM.D.   On: 06/01/2019 02:39   [place]domen Limited Ruq  Result Date: 06/01/2019 CLINICAL DATA:  Right upper quadrant pain. EXAM: ULTRASOUND ABDOMEN LIMITED RIGHT UPPER QUADRANT COMPARISON:  None. FINDINGS: Gallbladder: Physiologically distended. No gallstones or wall thickening visualized. No sonographic Murphy sign noted by sonographer. Common bile duct: Diameter: 2 mm, normal. Liver: No focal lesion identified. Within normal limits in parenchymal echogenicity. Portal vein is patent on color Doppler imaging with normal direction of blood flow towards the liver. IMPRESSION: Negative right upper quadrant ultrasound. Electronically Signed   By: MKeith RakeM.D.   On: 06/01/2019 03:12    Procedures .Lumbar Puncture  Date/Time: 06/01/2019 6:46 AM Performed by: REzequiel Essex MD Authorized by: REzequiel Essex MD   Consent:    Consent obtained:  Written   Consent given by:  Patient   Risks discussed:  Bleeding, headache, repeat procedure, pain, infection and nerve damage   Alternatives discussed:  No treatment Pre-procedure details:    Procedure purpose:  Diagnostic   Preparation: Patient was prepped and draped in usual sterile fashion   Anesthesia (see MAR for exact dosages):    Anesthesia method:  Local infiltration   Local anesthetic:  Lidocaine 1% w/o epi Procedure details:    Lumbar  space:  L4-L5 interspace   Patient position:  L lateral decubitus   Needle gauge:  20   Needle type:  Spinal needle - Quincke tip   Needle length (  in):  2.5   Ultrasound guidance: no     Number of attempts:  1   Fluid appearance:  Clear   Tubes of fluid:  4   Total volume (ml):  8 Post-procedure:    Puncture site:  Adhesive bandage applied   Patient tolerance of procedure:  Tolerated well, no immediate complications   (including critical care time)  Medications Ordered in ED Medications  sodium chloride flush (NS) 0.9 % injection 3 mL (has no administration in time range)     Initial Impression / Assessment and Plan / ED Course  I have reviewed the triage vital signs and the nursing notes.  Pertinent labs & imaging results that were available during my care of the patient were reviewed by me and considered in my medical decision making (see chart for details).       Patient with multiple complaints.  She states abdominal pain with vomiting and diarrhea for the past 4 days.  Shortness of breath with cough for the past 1 day with headache.  Contrary to triage note she states headache was gradual in onset.  Nevertheless CT scan obtained within 6 hours of headache onset is negative which effectively rules out subarachnoid hemorrhage per recent guidelines.  Labs show leukocytosis.  LFTs and lipase are normal.  Troponin and d-dimer are negative.  Chest x-ray is negative.  Patient still remains uncomfortable.  She has no gallstones seen on ultrasound.  Dimer is negative.  Doubt pulmonary embolism. Coronavirus testing is negative  Patient did have reassuring work-up including negative chest x-ray, negative abdominal CT and ultrasound.  Her abdomen is soft on recheck without peritoneal signs.  There is been no vomiting or diarrhea throughout her ED stay.   Continues to complain of headache.  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  Given unclear history and presentation, lumbar  puncture performed to rule out occult subarachnoid hemorrhage.    Patient able to ambulate without desaturation.  Her d-dimer is negative.  Low suspicion for ACS or PE.  She will be treated empirically for bronchitis with bronchodilators and steroids.  Smoking cessation encouraged.  Patient informed nursing staff that she does feel suicidal with plan to hurt herself. Patient will need TTS evaluation after her CSF results are obtained.  She is medically clear for TTS pending her CSF results.  Dr. Regenia Skeeter to assume care.  Final Clinical Impressions(s) / ED Diagnoses   Final diagnoses:  RUQ pain  Generalized abdominal pain  Bad headache    ED Discharge Orders    None       Gay Rape, Annie Main, MD 06/01/19 [phone]

## 2019-06-01 NOTE — ED Notes (Addendum)
PT called x2 for vitals no response

## 2019-06-01 NOTE — ED Notes (Signed)
[facility] at bedside to transport patient to [facility].

## 2019-06-01 NOTE — Progress Notes (Signed)
Pt accepted to [facility], [facility], Bed 500-1   Priscille Loveless, NP is the accepting provider.  Johnn Hai, MD. is the attending provider.  Call report to [place] [facility] notified.   Pt is Voluntary.  Pt may be transported by [facility]  Pt scheduled  to arrive at [facility] once EDP documents medical clearance.  Areatha Keas. Judi Cong, MSW, [place] Disposition Clinical Social Work [phone] (cell) [phone] (office)

## 2019-06-01 NOTE — Tx Team (Signed)
Initial Treatment Plan 06/01/2019 5:04 PM Cassandria Drew [id number]    PATIENT STRESSORS: Financial difficulties Health problems Marital or family conflict Substance abuse   PATIENT STRENGTHS: Capable of independent living Communication skills   PATIENT IDENTIFIED PROBLEMS: "To get better"  Depression  Suicidal ideation  Substance Abuse               DISCHARGE CRITERIA:  Ability to meet basic life and health needs Motivation to continue treatment in a less acute level of care  PRELIMINARY DISCHARGE PLAN: Attend aftercare/continuing care group Outpatient therapy Placement in alternative living arrangements  PATIENT/FAMILY INVOLVEMENT: This treatment plan has been presented to and reviewed with the patient, Briseida Gittings, and/or family member.  The patient and family have been given the opportunity to ask questions and make suggestions.  Vela Prose, RN 06/01/2019, 5:04 PM

## 2019-06-01 NOTE — Progress Notes (Signed)
Patient did not attend wrap up group.

## 2019-06-01 NOTE — ED Provider Notes (Signed)
7:58 AM Care transferred to me.  Patient's CSF shows 1 WBC and 0 RBCs in both tube 1 and tube 4.  Gram stain is negative.  Thus I think she is stable for TTS assessment.  Psychiatry would like to admit. She is medically stable for psychiatric admission.   Sherwood Gambler, MD 06/01/19 [id number]

## 2019-06-01 NOTE — Progress Notes (Signed)
Admission Note: Patient is a 53 years old female admitted to the unit for symptoms of depression, substance abuse, and suicidal ideation.  Patient is alert and oriented to person, place and time.  Presents with a flat affect and depressed mood.  Reports auditory and visual hallucinations.  States that she hears voices and sees dead people.  States goal for admission is to get better.  Admission plan of care reviewed and consent signed.  Skin is dry and intact.  No contraband found.  Patient oriented to the unit, staff and room.  Routine safety checks initiated.  Verbalizes understanding of unit rules and protocols.  Patient is safe on the unit.

## 2019-06-02 DIAGNOSIS — F333 Major depressive disorder, recurrent, severe with psychotic symptoms: Principal | ICD-10-CM

## 2019-06-02 DIAGNOSIS — Z915 Personal history of self-harm: Secondary | ICD-10-CM

## 2019-06-02 MED ORDER — FLUOXETINE HCL 20 MG PO CAPS
20.0000 mg | ORAL_CAPSULE | Freq: Every day | ORAL | Status: DC
  Administered 2019-06-02 – 2019-06-04 (×3): 20 mg via ORAL
  Filled 2019-06-02: qty 7
  Filled 2019-06-02 (×2): qty 1
  Filled 2019-06-02: qty 7
  Filled 2019-06-02 (×2): qty 1
  Filled 2019-06-02: qty 7

## 2019-06-02 MED ORDER — AMANTADINE HCL 100 MG PO CAPS
100.0000 mg | ORAL_CAPSULE | Freq: Two times a day (BID) | ORAL | Status: DC
  Administered 2019-06-02 – 2019-06-04 (×4): 100 mg via ORAL
  Filled 2019-06-02: qty 28
  Filled 2019-06-02 (×2): qty 1
  Filled 2019-06-02 (×4): qty 28
  Filled 2019-06-02: qty 1
  Filled 2019-06-02: qty 28
  Filled 2019-06-02: qty 1

## 2019-06-02 MED ORDER — TEMAZEPAM 30 MG PO CAPS
30.0000 mg | ORAL_CAPSULE | Freq: Every day | ORAL | Status: DC
  Administered 2019-06-02 – 2019-06-03 (×2): 30 mg via ORAL
  Filled 2019-06-02 (×2): qty 1
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
## 2019-06-02 NOTE — [facility] Suicide Risk Assessment (Signed)
[facility] Admission Suicide Risk Assessment   Nursing information obtained from:  Patient Demographic factors:  Divorced or widowed Current Mental Status:  Self-harm thoughts Loss Factors:  Financial problems / change in socioeconomic status Historical Factors:  NA Risk Reduction Factors:  NA  Total Time spent with patient: 45 minutes Principal Problem: Cocaine abuse homelessness depression Diagnosis:  Active Problems:   MDD (major depressive disorder), recurrent, severe, with psychosis ([place])  Subjective Data: Cocaine abuse/depression  Continued Clinical Symptoms:  Alcohol Use Disorder Identification Test Final Score (AUDIT): 0 The "Alcohol Use Disorders Identification Test", Guidelines for Use in Primary Care, Second Edition.  World Pharmacologist [facility]). Score between 0-7:  no or low risk or alcohol related problems. Score between 8-15:  moderate risk of alcohol related problems. Score between 16-19:  high risk of alcohol related problems. Score 20 or above:  warrants further diagnostic evaluation for alcohol dependence and treatment.   CLINICAL FACTORS:   Alcohol/Substance Abuse/Dependencies  Musculoskeletal: Strength & Muscle Tone: within normal limits Gait & Station: normal Patient leans: N/A  Psychiatric Specialty Exam: Physical Exam  ROS  Blood pressure 133/81, pulse 82, temperature 99 F (37.2 C), temperature source Oral, resp. rate 18, height 5' 6"  (1.676 m), weight 76.7 kg, SpO2 99 %.Body mass index is 27.28 kg/m.  General Appearance: Casual  Eye Contact:  Good  Speech:  Clear and Coherent  Volume:  Decreased  Mood:  Dysphoric  Affect:  Congruent  Thought Process:  Goal Directed and Descriptions of Associations: Tangential  Orientation:  Full (Time, Place, and Person)  Thought Content:  Logical and Tangential  Suicidal Thoughts:  No  Homicidal Thoughts:  No  Memory:  Immediate;   Fair  Judgement:  Fair  Insight:  Fair  Psychomotor Activity:  Normal   Concentration:  Concentration: Fair  Recall:  [employer] of Knowledge:  Fair  Language:  Fair  Akathisia:  Negative  Handed:  Right  AIMS (if indicated):     Assets:  Resilience Social Support  ADL's:  Intact  Cognition:  WNL  Sleep:  Number of Hours: 6.75       COGNITIVE FEATURES THAT CONTRIBUTE TO RISK:  Polarized thinking    SUICIDE RISK:   Moderate:  Frequent suicidal ideation with limited intensity, and duration, some specificity in terms of plans, no associated intent, good self-control, limited dysphoria/symptomatology, some risk factors present, and identifiable protective factors, including available and accessible social support.  PLAN OF CARE: admit for eval  I certify that inpatient services furnished can reasonably be expected to improve the patient's condition.   Johnn Hai, MD 06/02/2019, 12:07 PM

## 2019-06-02 NOTE — Progress Notes (Signed)
D: Pt passive SI/ HI- contracts for safety. Pt is pleasant and cooperative. Pt visible on the unit some this evening. Pt appeared to be responding some this evening. Pt was guarded at times.  A: Pt was offered support and encouragement. Pt was given scheduled medications. Pt was encourage to attend groups. Q 15 minute checks were done for safety.  R: Pt is taking medication. Pt has no complaints.Pt receptive to treatment and safety maintained on unit.

## 2019-06-02 NOTE — Plan of Care (Signed)
Progress note  D: pt found in bed; compliant with assessment. Pt is groggy, stating she didn't sleep well. Pt has no other complaints. Later in the day pt complained of SOB and wanted her albuterol inhaler started back. Pt denies any physical pain or symptoms. Pt denies si/hi/ah/vh and verbally agrees to approach staff if these become apparent or before harming himself/others while at [facility]: Pt provided support and encouragement. Pt given medication per protocol and standing orders. Q55msafety checks implemented and continued.  R: Pt safe on the unit. Will continue to monitor.  Pt progressing in the following metrics  Problem: Education: Goal: Knowledge of [facility] Education information/materials will improve Outcome: Progressing Goal: Emotional status will improve Outcome: Progressing Goal: Mental status will improve Outcome: Progressing Goal: Verbalization of understanding the information provided will improve Outcome: Progressing

## 2019-06-02 NOTE — Progress Notes (Signed)
Recreation Therapy Notes  Date: 06/02/2019 Time: 10:00 am Location: 500 hall   Group Topic: Introduction into Anxiety  Goal Area(s) Addresses:  Patient will work on worksheet on Introduction into Anxiety. Patient will follow directions on first prompt.  Behavioral Response: Appropriate  Intervention: Worksheet  Activity:  Staff on 500 hall were provided with a worksheet on Introduction into Anxiety. Staff was instructed to give it to the patients and have them work on it in place of [place]. Staff was also given 2 coloring sheets and 2 word searches and were given the option to give them out.  Education:  Ability to follow Directions, Change of thought processes Discharge Planning, Goal Planning.   Education Outcome: Acknowledges education/In group clarification offered  Clinical Observations/Feedback: . Due to COVID-19, guidelines group was not held. Group members were provided a learning activity worksheet to work on the topic and above-stated goals. LRT is available to answer any questions patient may have regarding the worksheet.  Tomi Likens, LRT/CTRS         Alexas Basulto L Jilliam Bellmore 06/02/2019 2:13 PM

## 2019-06-02 NOTE — Progress Notes (Signed)
D: Pt denies SI/HI/AVH. Pt is pleasant and cooperative. Pt spent some time in the dayroom, pt stated she felt about the same today. Pt sad on approach, but brightened a little on approach.  A: Pt was offered support and encouragement. Pt was given scheduled medications. Pt was encourage to attend groups. Q 15 minute checks were done for safety.  R:  Pt is taking medication. Pt has no complaints.Pt receptive to treatment and safety maintained on unit.

## 2019-06-02 NOTE — Tx Team (Signed)
Interdisciplinary Treatment and Diagnostic Plan Update  06/02/2019 Time of Session: 09:00am Kristal Perl MRN: [id number]  Principal Diagnosis: <principal problem not specified>  Secondary Diagnoses: Active Problems:   MDD (major depressive disorder), recurrent, severe, with psychosis ([place])   Current Medications:  Current Facility-Administered Medications  Medication Dose Route Frequency Provider Last Rate Last Dose  . acetaminophen (TYLENOL) tablet 650 mg  650 mg Oral Q6H PRN Johnn Hai, MD   650 mg at 06/01/19 2220  . alum & mag hydroxide-simeth (MAALOX/MYLANTA) 200-200-20 MG/5ML suspension 30 mL  30 mL Oral Q4H PRN Johnn Hai, MD      . hydrOXYzine (ATARAX/VISTARIL) tablet 25 mg  25 mg Oral TID PRN Johnn Hai, MD   25 mg at 06/01/19 2220  . magnesium hydroxide (MILK OF MAGNESIA) suspension 30 mL  30 mL Oral Daily PRN Johnn Hai, MD      . traZODone (DESYREL) tablet 100 mg  100 mg Oral QHS,MR X 1 Johnn Hai, MD   100 mg at 06/01/19 2220   PTA Medications: Medications Prior to Admission  Medication Sig Dispense Refill Last Dose  . albuterol (PROVENTIL HFA;VENTOLIN HFA) 108 (90 BASE) MCG/ACT inhaler Inhale 2 puffs into the lungs every 6 (six) hours as needed for wheezing or shortness of breath.     Marland Kitchen amLODipine (NORVASC) 5 MG tablet Take 1 tablet (5 mg total) by mouth daily. (Patient not taking: Reported on 04/13/2019) 30 tablet 0   . nitroGLYCERIN (NITROSTAT) 0.4 MG SL tablet Place 1 tablet (0.4 mg total) under the tongue every 5 (five) minutes x 3 doses as needed for chest pain. (Patient not taking: Reported on 04/13/2019) 25 tablet 1   . predniSONE (DELTASONE) 50 MG tablet 1 tablet PO daily 5 tablet 0     Patient Stressors: Financial difficulties Health problems Marital or family conflict Substance abuse  Patient Strengths: Capable of independent living Communication skills  Treatment Modalities: Medication Management, Group therapy, Case management,  1 to 1  session with clinician, Psychoeducation, Recreational therapy.   Physician Treatment Plan for Primary Diagnosis: <principal problem not specified> Long Term Goal(s):     Short Term Goals:    Medication Management: Evaluate patient's response, side effects, and tolerance of medication regimen.  Therapeutic Interventions: 1 to 1 sessions, Unit Group sessions and Medication administration.  Evaluation of Outcomes: Not Met  Physician Treatment Plan for Secondary Diagnosis: Active Problems:   MDD (major depressive disorder), recurrent, severe, with psychosis ([place])  Long Term Goal(s):     Short Term Goals:       Medication Management: Evaluate patient's response, side effects, and tolerance of medication regimen.  Therapeutic Interventions: 1 to 1 sessions, Unit Group sessions and Medication administration.  Evaluation of Outcomes: Not Met   RN Treatment Plan for Primary Diagnosis: <principal problem not specified> Long Term Goal(s): Knowledge of disease and therapeutic regimen to maintain health will improve  Short Term Goals: Ability to participate in decision making will improve, Ability to verbalize feelings will improve, Ability to disclose and discuss suicidal ideas, Ability to identify and develop effective coping behaviors will improve and Compliance with prescribed medications will improve  Medication Management: RN will administer medications as ordered by provider, will assess and evaluate patient's response and provide education to patient for prescribed medication. RN will report any adverse and/or side effects to prescribing provider.  Therapeutic Interventions: 1 on 1 counseling sessions, Psychoeducation, Medication administration, Evaluate responses to treatment, Monitor vital signs and CBGs as ordered, Perform/monitor CIWA, COWS,  AIMS and Fall Risk screenings as ordered, Perform wound care treatments as ordered.  Evaluation of Outcomes: Not Met   LCSW Treatment Plan  for Primary Diagnosis: <principal problem not specified> Long Term Goal(s): Safe transition to appropriate next level of care at discharge, Engage patient in therapeutic group addressing interpersonal concerns.  Short Term Goals: Engage patient in aftercare planning with referrals and resources and Increase skills for wellness and recovery  Therapeutic Interventions: Assess for all discharge needs, 1 to 1 time with Social worker, Explore available resources and support systems, Assess for adequacy in community support network, Educate family and significant other(s) on suicide prevention, Complete Psychosocial Assessment, Interpersonal group therapy.  Evaluation of Outcomes: Not Met   Progress in Treatment: Attending groups: No. Participating in groups: No. Taking medication as prescribed: Yes. Toleration medication: Yes. Family/Significant other contact made: No, will contact:  will contact if given consent to contact Patient understands diagnosis: No. Discussing patient identified problems/goals with staff: Yes. Medical problems stabilized or resolved: Yes. Denies suicidal/homicidal ideation: No.; pt did safety plan with nurse staff Issues/concerns per patient self-inventory: No. Other:   New problem(s) identified: No, Describe:  None  New Short Term/Long Term Goal(s): Medication stabilization, elimination of SI thoughts, and development of a comprehensive mental wellness plan.   Patient Goals:    Discharge Plan or Barriers: CSW will continue to follow up for appropriate referrals and possible discharge planning  Reason for Continuation of Hospitalization: Depression Homicidal ideation Medication stabilization Suicidal ideation  Estimated Length of Stay: 3-5 days  Attendees: Patient: 06/02/2019   Physician: Dr. Johnn Hai, MD 06/02/2019   Nursing: Legrand Como, RN 06/02/2019   RN Care Manager: 06/02/2019   Social Worker: Ardelle Anton, LCSW 06/02/2019   Recreational Therapist:   06/02/2019   Other:  06/02/2019   Other:  06/02/2019   Other: 06/02/2019     Scribe for Treatment Team: Trecia Rogers, LCSW 06/02/2019 9:36 AM

## 2019-06-02 NOTE — [facility] Counselor (Signed)
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   CSW tried to come back at 2:30pm but patient was asleep.
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
## 2019-06-03 MED ORDER — AMANTADINE HCL 100 MG PO CAPS: 100.0000 mg | ORAL_CAPSULE | Freq: Two times a day (BID) | ORAL | 1 refills | Status: DC

## 2019-06-03 MED ORDER — FLUOXETINE HCL 20 MG PO CAPS: 20.0000 mg | ORAL_CAPSULE | Freq: Every day | ORAL | 1 refills | Status: DC

## 2019-06-03 NOTE — Progress Notes (Signed)
[facility] MD Progress Note  06/03/2019 9:25 AM Denise Knight  MRN:  [id number] Subjective:   Patient was scheduled for discharge but became tearful discussing with staff she was not sure if her daughter would let her back in the home so organ to try and confirm this.  Patient alert and oriented little tearful no thoughts of harming self contracting here.  Denies hallucinations Principal Problem: Cocaine abuse opiate abuse homelessness Diagnosis: Active Problems:   MDD (major depressive disorder), recurrent, severe, with psychosis ([place])  Total Time spent with patient: 20 minutes  Past Medical History:  Past Medical History:  Diagnosis Date  . Anxiety   . Arthritis    "left knee" (07/05/2016)  . Asthma   . Bipolar 1 disorder ([place])   . Depression   . GERD (gastroesophageal reflux disease)   . Hypertension   . MI (mitral incompetence)   . Post traumatic stress disorder   . Schizophrenia ([place])   . Stomach ulcer     Past Surgical History:  Procedure Laterality Date  . CARDIAC CATHETERIZATION  07/05/2016  . CARDIAC CATHETERIZATION N/A 07/05/2016   Procedure: Left Heart Cath and Coronary Angiography;  Surgeon: Adrian Prows, MD;  Location: [place] [facility];  Service: Cardiovascular;  Laterality: N/A;  . CYST EXCISION Right    "wrist"  . DILATION AND CURETTAGE OF UTERUS    . FOOT SURGERY Bilateral    "corns removed"  . LEFT HEART CATH AND CORONARY ANGIOGRAPHY N/A 12/24/2017   Procedure: LEFT HEART CATH AND CORONARY ANGIOGRAPHY;  Surgeon: Nigel Mormon, MD;  Location: [place] [facility];  Service: Cardiovascular;  Laterality: N/A;  . TUBAL LIGATION     Family History:  Family History  Problem Relation Age of Onset  . Breast cancer Other   . Lung cancer Other   . Congestive Heart Failure Other   . Diabetes Other   . Hypertension Other     Social History:  Social History   Substance and Sexual Activity  Alcohol Use Yes  . Alcohol/week: 7.0 standard drinks  . Types: 7  Cans of beer per week   Comment: socially     Social History   Substance and Sexual Activity  Drug Use Yes  . Types: Cocaine   Comment: 2 days    Social History   Socioeconomic History  . Marital status: Widowed    Spouse name: Not on file  . Number of children: 2  . Years of education: Not on file  . Highest education level: Not on file  Occupational History  . Occupation: disabled   Social Needs  . Financial resource strain: Not on file  . Food insecurity    Worry: Not on file    Inability: Not on file  . Transportation needs    Medical: Not on file    Non-medical: Not on file  Tobacco Use  . Smoking status: Current Some Day Smoker    Packs/day: 0.50    Years: 35.00    Pack years: 17.50    Types: Cigarettes  . Smokeless tobacco: Never Used  Substance and Sexual Activity  . Alcohol use: Yes    Alcohol/week: 7.0 standard drinks    Types: 7 Cans of beer per week    Comment: socially  . Drug use: Yes    Types: Cocaine    Comment: 2 days  . Sexual activity: Yes    Birth control/protection: Surgical  Lifestyle  . Physical activity    Days per  week: Not on file    Minutes per session: Not on file  . Stress: Not on file  Relationships  . Social Herbalist on phone: Not on file    Gets together: Not on file    Attends religious service: Not on file    Active member of club or organization: Not on file    Attends meetings of clubs or organizations: Not on file    Relationship status: Not on file  Other Topics Concern  . Not on file  Social History Narrative  . Not on file   Additional Social History:                         Sleep: Good  Appetite:  Good  Current Medications: Current Facility-Administered Medications  Medication Dose Route Frequency Provider Last Rate Last Dose  . acetaminophen (TYLENOL) tablet 650 mg  650 mg Oral Q6H PRN Johnn Hai, MD   650 mg at 06/02/19 1744  . alum & mag hydroxide-simeth (MAALOX/MYLANTA)  200-200-20 MG/5ML suspension 30 mL  30 mL Oral Q4H PRN Johnn Hai, MD   30 mL at 06/02/19 1432  . amantadine (SYMMETREL) capsule 100 mg  100 mg Oral BID Johnn Hai, MD   100 mg at 06/03/19 9470  . FLUoxetine (PROZAC) capsule 20 mg  20 mg Oral Daily Johnn Hai, MD   20 mg at 06/03/19 9628  . hydrOXYzine (ATARAX/VISTARIL) tablet 25 mg  25 mg Oral TID PRN Johnn Hai, MD   25 mg at 06/01/19 2220  . magnesium hydroxide (MILK OF MAGNESIA) suspension 30 mL  30 mL Oral Daily PRN Johnn Hai, MD   30 mL at 06/02/19 1432  . temazepam (RESTORIL) capsule 30 mg  30 mg Oral QHS Johnn Hai, MD   30 mg at 06/02/19 2118  . traZODone (DESYREL) tablet 100 mg  100 mg Oral QHS,MR X 1 Johnn Hai, MD   100 mg at 06/02/19 2118    Lab Results: No results found for this or any previous visit (from the past 75 hour(s)).  Blood Alcohol level:  Lab Results  Component Value Date   ETH <10 36/62/9476    Metabolic Disorder Labs: Lab Results  Component Value Date   HGBA1C 5.8 (H) 06/15/2017   MPG 120 06/15/2017   No results found for: PROLACTIN Lab Results  Component Value Date   CHOL 217 (H) 07/05/2016   TRIG 107 07/05/2016   HDL 59 07/05/2016   CHOLHDL 3.7 07/05/2016   VLDL 21 07/05/2016   LDLCALC 137 (H) 07/05/2016    Physical Findings: AIMS: Facial and Oral Movements Muscles of Facial Expression: None, normal Lips and Perioral Area: None, normal Jaw: None, normal Tongue: None, normal,Extremity Movements Upper (arms, wrists, hands, fingers): None, normal Lower (legs, knees, ankles, toes): None, normal, Trunk Movements Neck, shoulders, hips: None, normal, Overall Severity Severity of abnormal movements (highest score from questions above): None, normal Incapacitation due to abnormal movements: None, normal Patient's awareness of abnormal movements (rate only patient's report): No Awareness, Dental Status Current problems with teeth and/or dentures?: No Does patient usually wear  dentures?: No  CIWA:    COWS:     Musculoskeletal: Strength & Muscle Tone: within normal limits Gait & Station: normal Patient leans: N/A  Psychiatric Specialty Exam: ROS  Blood pressure 119/79, pulse 67, temperature 99 F (37.2 C), temperature source Oral, resp. rate 18, height 5' 6"  (1.676 m), weight 76.7 kg, SpO2 96 %.  Body mass index is 27.28 kg/m.  General Appearance: Casual  Eye Contact::  Good  Speech:  Clear and Coherent409  Volume:  Normal  Mood:  Dysphoric  Affect:  Constricted and Restricted  Thought Process:  Coherent and Descriptions of Associations: Intact  Orientation:  Full (Time, Place, and Person)  Thought Content:  Logical  Suicidal Thoughts:  No  Homicidal Thoughts:  No  Memory:  Immediate;   Fair  Judgement:  Fair  Insight:  Fair  Psychomotor Activity:  Normal  Concentration:  fair  Recall:  [employer] of Knowledge:Fair  Language: Good  Akathisia:  Negative  Handed:  Right  AIMS (if indicated):     Assets:  Communication Skills Desire for Improvement  Sleep:  Number of Hours: 6.75  Cognition: WNL  ADL's:  Intact      Treatment Plan Summary: Daily contact with patient to assess and evaluate symptoms and progress in treatment, Medication management and Plan Continue to monitor 15-minute checks probable discharge tomorrow secure some type of housing currently some plan by tomorrow no change in meds  Johnn Hai, MD 06/03/2019, 9:25 AM

## 2019-06-03 NOTE — Progress Notes (Signed)
Patient ID: Denise Knight, female   DOB: [birth year], 53 y.o.   MRN: [id number]  Nursing Progress Note [id number]  Patient presents anxious/cautious during interactions but is cooperative. Patient compliant with scheduled medications. Patient is isolative to her room. Patient currently denies SI/HI/AVH.   Patient is educated about and provided medication per provider's orders. Patient safety maintained with q15 min safety checks and low fall risk precautions. Emotional support given, 1:1 interaction, and active listening provided. Patient encouraged to attend meals, groups, and work on treatment plan and goals. Labs, vital signs and patient behavior monitored throughout shift. Patient encouraged to wear mask when in the milieu and is educated about coronavirus infection control precautions.  Patient compliant with mask on the unit. Patient contracts for safety with staff. Patient remains safe on the unit at this time and agrees to come to staff with any issues/concerns. Patient is interacting with peers appropriately on the unit. Will continue to support and monitor.

## 2019-06-03 NOTE — Progress Notes (Signed)
D: Pt denies SI/HI/AVH. Pt is pleasant and cooperative. Pt sad about not leaving, but hopeful to leave tomorrow.  A: Pt was offered support and encouragement. Pt was given scheduled medications. Pt was encourage to attend groups. Q 15 minute checks were done for safety.  R:Pt attends groups and interacts well with peers and staff. Pt is taking medication. Pt has no complaints.Pt receptive to treatment and safety maintained on unit.

## 2019-06-03 NOTE — Progress Notes (Signed)
The patient verbalized that she didn't have a very good day since she did not feel well enough to get discharged. She explained that she continues to have suicidal thoughts. Her goal for tomorrow is to eliminate her suicidal thoughts which are "out of my head".

## 2019-06-03 NOTE — Progress Notes (Signed)
Recreation Therapy Notes  Date: 06/03/2019 Time: 10:00 am Location: [address]   Group Topic: Goal planning  Goal Area(s) Addresses:  Patient will work on worksheet on Goal planning. Patient will follow directions on first prompt.  Behavioral Response: Appropriate  Intervention: Worksheet  Activity:  Staff on [address] were provided with a worksheet on Goal planning. Staff was instructed to give it to the patients and have them work on it in place of [place]. Staff was also given 2 coloring sheets and 2 word searches and were given the option to give them out.  Education:  Ability to follow Directions, Change of thought processes Discharge Planning, Goal Planning.   Education Outcome: Acknowledges education/In group clarification offered  Clinical Observations/Feedback: . Due to COVID-19, guidelines group was not held. Group members were provided a learning activity worksheet to work on the topic and above-stated goals. LRT is available to answer any questions patient may have regarding the worksheet.  Tomi Likens, LRT/CTRS         Denise Knight L Denise Knight 06/03/2019 1:02 PM

## 2019-06-03 NOTE — [facility] Suicide Risk Assessment (Signed)
[facility] Discharge Suicide Risk Assessment   Principal Problem: Cocaine abuse/opiate abuse/homelessness/depressive symptoms/ Discharge Diagnoses: Active Problems:   MDD (major depressive disorder), recurrent, severe, with psychosis ([place])   Total Time spent with patient: 45 minutes  Musculoskeletal: Strength & Muscle Tone: within normal limits Gait & Station: normal Patient leans: N/A  Psychiatric Specialty Exam: ROS  Blood pressure 119/79, pulse 67, temperature 99 F (37.2 C), temperature source Oral, resp. rate 18, height 5' 6"  (1.676 m), weight 76.7 kg, SpO2 96 %.Body mass index is 27.28 kg/m.  General Appearance: Casual  Eye Contact::  Good  Speech:  Clear and Coherent409  Volume:  Normal  Mood:  Dysphoric  Affect:  Constricted and Restricted  Thought Process:  Coherent and Descriptions of Associations: Intact  Orientation:  Full (Time, Place, and Person)  Thought Content:  Logical  Suicidal Thoughts:  No  Homicidal Thoughts:  No  Memory:  Immediate;   Fair  Judgement:  Fair  Insight:  Fair  Psychomotor Activity:  Normal  Concentration:  fair  Recall:  [employer] of Knowledge:Fair  Language: Good  Akathisia:  Negative  Handed:  Right  AIMS (if indicated):     Assets:  Communication Skills Desire for Improvement  Sleep:  Number of Hours: 6.75  Cognition: WNL  ADL's:  Intact   Mental Status Per Nursing Assessment::   On Admission:  Self-harm thoughts  Demographic Factors:  Female  Loss Factors: NA  Historical Factors: NA  Risk Reduction Factors:   Living with another person, especially a relative  Continued Clinical Symptoms:  Dysthymia  Cognitive Features That Contribute To Risk:  None    Suicide Risk:  Minimal: No identifiable suicidal ideation.  Patients presenting with no risk factors but with morbid ruminations; may be classified as minimal risk based on the severity of the depressive symptoms    Plan Of Care/Follow-up recommendations:   Activity:  full  Kathalene Sporer, MD 06/03/2019, 8:53 AM

## 2019-06-04 LAB — CSF CULTURE W GRAM STAIN: Culture: NO GROWTH

## 2019-06-04 MED ORDER — ALBUTEROL SULFATE HFA 108 (90 BASE) MCG/ACT IN AERS
INHALATION_SPRAY | RESPIRATORY_TRACT | Status: AC
  Administered 2019-06-04: 08:00:00
  Filled 2019-06-04: qty 6.7

## 2019-06-04 NOTE — Plan of Care (Signed)
Patient was on the unit and was given group materials for recreation therapy sessions while admitted.
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
## 2019-06-04 NOTE — Progress Notes (Signed)
Patient ID: Denise Knight, female   DOB: 11-04-1966, 53 y.o.   MRN: [id number]   CSW scheduled patient a [place] ride to her daughter's home where she currently stays for 12:30pm. CSW informed the patient's nurse.

## 2019-06-04 NOTE — BHH Counselor (Signed)
Patient is being discharged today. CSW was unable to do PSA due to attempts and patient discharging within 72 hours.

## 2019-06-04 NOTE — Progress Notes (Signed)
  [facility] Adult Case Management Discharge Plan :  Will you be returning to the same living situation after discharge:  Yes,  pt's daughter At discharge, do you have transportation home?: Yes,  [employer] at 12:30pm Do you have the ability to pay for your medications: No.; [facility]  Release of information consent forms completed and in the chart;  Patient's signature needed at discharge.  Patient to Follow up at: Follow-up Information    [facility] Follow up on 06/08/2019.   Why: Telephonic hospital follow up appointmnet is Tuesday, 7/28 at 11:30a.  The provider will contact you.  Contact information: [address] [place] [address] [phone]           Next level of care provider has access to [place] and Suicide Prevention discussed: Yes,  pt's daughter     Has patient been referred to the Quitline?: Yes, faxed on 06/04/2019  Patient has been referred for addiction treatment: Yes  Trecia Rogers, LCSW 06/04/2019, 10:41 AM

## 2019-06-04 NOTE — Progress Notes (Addendum)
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 Patient will follow directions on first prompt.  Behavioral Response: Appropriate  Intervention: Worksheet  Activity:  Staff on 500 hall were provided with a worksheet on Strengths Exploration. Staff was instructed to give it to the patients and have them work on it in place of [place]. Staff was also given 2 coloring sheets and 2 word searches and were given the option to give them out.  Education:  Ability to follow Directions, Change of thought processes Discharge Planning, Goal Planning.   Education Outcome: Acknowledges education/In group clarification offered  Clinical Observations/Feedback: . Due to COVID-19, guidelines group was not held. Group members were provided a learning activity worksheet to work on the topic and above-stated goals. LRT is available to answer any questions patient may have regarding the worksheet.  Tomi Likens, LRT/CTRS         Kasheena Sambrano L Alivia Cimino 06/04/2019 12:00 PM

## 2019-06-04 NOTE — BHH Suicide Risk Assessment (Signed)
[facility] Discharge Suicide Risk Assessment  Principal Problem: Cocaine abuse/opiate abuse/homelessness/depressive symptoms/ Discharge Diagnoses: Active Problems:   MDD (major depressive disorder), recurrent, severe, with psychosis ([place])   Total Time spent with patient: 45 minutes  Musculoskeletal: Strength & Muscle Tone: within normal limits Gait & Station: normal Patient leans: N/A  Psychiatric Specialty Exam: ROS  Blood pressure 119/79, pulse 67, temperature 99 F (37.2 C), temperature source Oral, resp. rate 18, height 5' 6"  (1.676 m), weight 76.7 kg, SpO2 96 %.Body mass index is 27.28 kg/m.  General Appearance: Casual  Eye Contact::  Good  Speech:  Clear and Coherent409  Volume:  Normal  Mood:  Dysphoric  Affect:  Constricted and Restricted  Thought Process:  Coherent and Descriptions of Associations: Intact  Orientation:  Full (Time, Place, and Person)  Thought Content:  Logical  Suicidal Thoughts:  No  Homicidal Thoughts:  No  Memory:  Immediate;   Fair  Judgement:  Fair  Insight:  Fair  Psychomotor Activity:  Normal  Concentration:  fair  Recall:  [employer] of Knowledge:Fair  Language: Good  Akathisia:  Negative  Handed:  Right  AIMS (if indicated):     Assets:  Communication Skills Desire for Improvement  Sleep:  Number of Hours: 6.75  Cognition: WNL  ADL's:  Intact   Mental Status Per Nursing Assessment::   On Admission:  Self-harm thoughts  Demographic Factors:  Female  Loss Factors: NA  Historical Factors: NA  Risk Reduction Factors:   Living with another person, especially a relative  Continued Clinical Symptoms:  Dysthymia  Cognitive Features That Contribute To Risk:  None    Suicide Risk:  Minimal: No identifiable suicidal ideation.  Patients presenting with no risk factors but with morbid ruminations; may be classified as minimal risk based on the severity of the depressive symptoms        Follow-up Information    [facility] Follow up on 06/08/2019.   Why: Telephonic hospital follow up appointmnet is Tuesday, 7/28 at 11:30a.  The provider will contact you.  Contact information: [address] [place] [address] [phone]           Plan Of Care/Follow-up recommendations:  Activity:  full  Shalom Mcguiness, MD 06/04/2019, 8:14 AM

## 2019-06-04 NOTE — Progress Notes (Signed)
Recreation Therapy Notes  INPATIENT RECREATION TR PLAN  Patient Details Name: Denise Knight MRN: [id number] DOB: 1966/05/04 Today's Date: 06/04/2019  Rec Therapy Plan Is patient appropriate for Therapeutic Recreation?: Yes Treatment times per week: 3-5 times per week Estimated Length of Stay: 5-7 days TR Treatment/Interventions: Group participation (Comment)  Discharge Criteria Pt will be discharged from therapy if:: Discharged Treatment plan/goals/alternatives discussed and agreed upon by:: Patient/family  Discharge Summary Short term goals set: see patient care plan Short term goals met: Adequate for discharge Progress toward goals comments: Groups attended Which groups?: Goal setting(introduction to anxiety, strengths exploration) Reason goals not met: n/a Therapeutic equipment acquired: none Reason patient discharged from therapy: Discharge from hospital Pt/family agrees with progress & goals achieved: Yes Date patient discharged from therapy: 06/04/19  Tomi Likens, LRT/CTRS  [place] 06/04/2019, 12:47 PM

## 2019-06-04 NOTE — BHH Suicide Risk Assessment (Signed)
[place] INPATIENT:  Family/Significant Other Suicide Prevention Education  Suicide Prevention Education:  Education Completed; Pt's daughter, Murlean Caller, has been identified by the patient as the family member/significant other with whom the patient will be residing, and identified as the person(s) who will aid the patient in the event of a mental health crisis (suicidal ideations/suicide attempt).  With written consent from the patient, the family member/significant other has been provided the following suicide prevention education, prior to the and/or following the discharge of the patient.  The suicide prevention education provided includes the following:  Suicide risk factors  Suicide prevention and interventions  National Suicide Hotline telephone number  [facility] assessment telephone number  [facility] Emergency Assistance [facility] and/or Residential Mobile Crisis Unit telephone number  Request made of family/significant other to:  Remove weapons (e.g., guns, rifles, knives), all items previously/currently identified as safety concern.    Remove drugs/medications (over-the-counter, prescriptions, illicit drugs), all items previously/currently identified as a safety concern.  The family member/significant other verbalizes understanding of the suicide prevention education information provided.  The family member/significant other agrees to remove the items of safety concern listed above.  CSW contacted pt's daughter, Murlean Caller. CSW discussed the patient discharging today. Pt's daughter stated that she is okay with that but per the patient request she is okay with Monday. CSW let the pt's daughter know that the doctor wants to let the patient discharge today. Pt's daughter agreed to that and asked when the patient could be discharged.   Trecia Rogers 06/04/2019, 9:01 AM

## 2019-06-04 NOTE — Progress Notes (Signed)
Patient ID: Denise Knight, female   DOB: 05-27-1966, 53 y.o.   MRN: [id number]  Discharge Note  Patient denies SI/HI and states readiness for discharge.  Written and verbal discharge instructions reviewed with the patient. Patient accepting to information and verbalized understanding with no concerns. All belongings returned to patient from the unit and secured lockers.  Patient was safely escorted to the lobby for discharge at 1230.  Patient observed entering [place] vehicle for transportation home.

## 2019-06-04 NOTE — Tx Team (Signed)
Interdisciplinary Treatment and Diagnostic Plan Update  06/04/2019 Time of Session: 09:00am Denise Knight MRN: [id number]  Principal Diagnosis: <principal problem not specified>  Secondary Diagnoses: Active Problems:   MDD (major depressive disorder), recurrent, severe, with psychosis ([place])   Current Medications:  Current Facility-Administered Medications  Medication Dose Route Frequency Provider Last Rate Last Dose  . acetaminophen (TYLENOL) tablet 650 mg  650 mg Oral Q6H PRN Johnn Hai, MD   650 mg at 06/02/19 1744  . alum & mag hydroxide-simeth (MAALOX/MYLANTA) 200-200-20 MG/5ML suspension 30 mL  30 mL Oral Q4H PRN Johnn Hai, MD   30 mL at 06/02/19 1432  . amantadine (SYMMETREL) capsule 100 mg  100 mg Oral BID Johnn Hai, MD   100 mg at 06/04/19 0803  . FLUoxetine (PROZAC) capsule 20 mg  20 mg Oral Daily Johnn Hai, MD   20 mg at 06/04/19 0803  . hydrOXYzine (ATARAX/VISTARIL) tablet 25 mg  25 mg Oral TID PRN Johnn Hai, MD   25 mg at 06/04/19 0121  . magnesium hydroxide (MILK OF MAGNESIA) suspension 30 mL  30 mL Oral Daily PRN Johnn Hai, MD   30 mL at 06/02/19 1432  . temazepam (RESTORIL) capsule 30 mg  30 mg Oral QHS Johnn Hai, MD   30 mg at 06/03/19 2042  . traZODone (DESYREL) tablet 100 mg  100 mg Oral QHS,MR X 1 Johnn Hai, MD   100 mg at 06/04/19 0120   PTA Medications: Medications Prior to Admission  Medication Sig Dispense Refill Last Dose  . albuterol (PROVENTIL HFA;VENTOLIN HFA) 108 (90 BASE) MCG/ACT inhaler Inhale 2 puffs into the lungs every 6 (six) hours as needed for wheezing or shortness of breath.     Marland Kitchen amLODipine (NORVASC) 5 MG tablet Take 1 tablet (5 mg total) by mouth daily. (Patient not taking: Reported on 04/13/2019) 30 tablet 0   . nitroGLYCERIN (NITROSTAT) 0.4 MG SL tablet Place 1 tablet (0.4 mg total) under the tongue every 5 (five) minutes x 3 doses as needed for chest pain. (Patient not taking: Reported on 04/13/2019) 25 tablet 1   .  predniSONE (DELTASONE) 50 MG tablet 1 tablet PO daily 5 tablet 0     Patient Stressors: Financial difficulties Health problems Marital or family conflict Substance abuse  Patient Strengths: Capable of independent living Communication skills  Treatment Modalities: Medication Management, Group therapy, Case management,  1 to 1 session with clinician, Psychoeducation, Recreational therapy.   Physician Treatment Plan for Primary Diagnosis: <principal problem not specified> Long Term Goal(s): Improvement in symptoms so as ready for discharge Improvement in symptoms so as ready for discharge   Short Term Goals: Ability to disclose and discuss suicidal ideas Ability to demonstrate self-control will improve Ability to maintain clinical measurements within normal limits will improve Compliance with prescribed medications will improve Ability to identify triggers associated with substance abuse/mental health issues will improve  Medication Management: Evaluate patient's response, side effects, and tolerance of medication regimen.  Therapeutic Interventions: 1 to 1 sessions, Unit Group sessions and Medication administration.  Evaluation of Outcomes: Adequate for Discharge  Physician Treatment Plan for Secondary Diagnosis: Active Problems:   MDD (major depressive disorder), recurrent, severe, with psychosis ([place])  Long Term Goal(s): Improvement in symptoms so as ready for discharge Improvement in symptoms so as ready for discharge   Short Term Goals: Ability to disclose and discuss suicidal ideas Ability to demonstrate self-control will improve Ability to maintain clinical measurements within normal limits will improve Compliance with prescribed  medications will improve Ability to identify triggers associated with substance abuse/mental health issues will improve     Medication Management: Evaluate patient's response, side effects, and tolerance of medication regimen.  Therapeutic  Interventions: 1 to 1 sessions, Unit Group sessions and Medication administration.  Evaluation of Outcomes: Adequate for Discharge   RN Treatment Plan for Primary Diagnosis: <principal problem not specified> Long Term Goal(s): Knowledge of disease and therapeutic regimen to maintain health will improve  Short Term Goals: Ability to participate in decision making will improve, Ability to verbalize feelings will improve, Ability to disclose and discuss suicidal ideas, Ability to identify and develop effective coping behaviors will improve and Compliance with prescribed medications will improve  Medication Management: RN will administer medications as ordered by provider, will assess and evaluate patient's response and provide education to patient for prescribed medication. RN will report any adverse and/or side effects to prescribing provider.  Therapeutic Interventions: 1 on 1 counseling sessions, Psychoeducation, Medication administration, Evaluate responses to treatment, Monitor vital signs and CBGs as ordered, Perform/monitor CIWA, COWS, AIMS and Fall Risk screenings as ordered, Perform wound care treatments as ordered.  Evaluation of Outcomes: Adequate for Discharge   LCSW Treatment Plan for Primary Diagnosis: <principal problem not specified> Long Term Goal(s): Safe transition to appropriate next level of care at discharge, Engage patient in therapeutic group addressing interpersonal concerns.  Short Term Goals: Engage patient in aftercare planning with referrals and resources and Increase skills for wellness and recovery  Therapeutic Interventions: Assess for all discharge needs, 1 to 1 time with Social worker, Explore available resources and support systems, Assess for adequacy in community support network, Educate family and significant other(s) on suicide prevention, Complete Psychosocial Assessment, Interpersonal group therapy.  Evaluation of Outcomes: Adequate for  Discharge   Progress in Treatment: Attending groups: No. Participating in groups: No. Taking medication as prescribed: Yes. Toleration medication: Yes. Family/Significant other contact made: Yes, individual(s) contacted:  pt's daughter Patient understands diagnosis: Yes. Discussing patient identified problems/goals with staff: Yes. Medical problems stabilized or resolved: Yes. Denies suicidal/homicidal ideation: Yes. Issues/concerns per patient self-inventory: No. Other:   New problem(s) identified: No, Describe:  None  New Short Term/Long Term Goal(s): Medication stabilization, elimination of SI thoughts, and development of a comprehensive mental wellness plan.   Patient Goals:    Discharge Plan or Barriers: Pt is discharging today. Pt will be doing follow up at [facility].   Reason for Continuation of Hospitalization: Patient is discharging today.   Estimated Length of Stay: Patient is discharging today.   Attendees: Patient: 06/04/2019   Physician: Dr. Johnn Hai, MD 06/04/2019  Nursing: Lurline Hare 06/04/2019   RN Care Manager: 06/04/2019   Social Worker: Ardelle Anton, LCSW 06/04/2019   Recreational Therapist:  06/04/2019   Other:  06/04/2019   Other:  06/04/2019   Other: 06/04/2019     Scribe for Treatment Team: Trecia Rogers, LCSW 06/04/2019 9:58 AM

## 2019-07-21 ENCOUNTER — Emergency Department (HOSPITAL_COMMUNITY)
Admission: EM | Admit: 2019-07-21 | Discharge: 2019-07-21 | Disposition: A | Discharge status: Home / Self Care | Attending: Emergency Medicine | Admitting: Emergency Medicine

## 2019-07-21 ENCOUNTER — Encounter (HOSPITAL_COMMUNITY): Payer: Self-pay | Admitting: Emergency Medicine

## 2019-07-21 ENCOUNTER — Other Ambulatory Visit: Payer: Self-pay

## 2019-07-21 DIAGNOSIS — Z79899 Other long term (current) drug therapy: Secondary | ICD-10-CM | POA: Insufficient documentation

## 2019-07-21 DIAGNOSIS — R1033 Periumbilical pain: Secondary | ICD-10-CM | POA: Insufficient documentation

## 2019-07-21 DIAGNOSIS — K922 Gastrointestinal hemorrhage, unspecified: Secondary | ICD-10-CM | POA: Diagnosis not present

## 2019-07-21 DIAGNOSIS — K644 Residual hemorrhoidal skin tags: Secondary | ICD-10-CM | POA: Diagnosis not present

## 2019-07-21 DIAGNOSIS — K625 Hemorrhage of anus and rectum: Secondary | ICD-10-CM | POA: Diagnosis present

## 2019-07-21 DIAGNOSIS — F17210 Nicotine dependence, cigarettes, uncomplicated: Secondary | ICD-10-CM | POA: Diagnosis not present

## 2019-07-21 DIAGNOSIS — I252 Old myocardial infarction: Secondary | ICD-10-CM | POA: Diagnosis not present

## 2019-07-21 DIAGNOSIS — F250 Schizoaffective disorder, bipolar type: Secondary | ICD-10-CM | POA: Diagnosis not present

## 2019-07-21 DIAGNOSIS — R112 Nausea with vomiting, unspecified: Secondary | ICD-10-CM | POA: Diagnosis not present

## 2019-07-21 LAB — COMPREHENSIVE METABOLIC PANEL
ALT: 19 U/L (ref 0–44)
AST: 23 U/L (ref 15–41)
Albumin: 3.2 g/dL — ABNORMAL LOW (ref 3.5–5.0)
Alkaline Phosphatase: 67 U/L (ref 38–126)
Anion gap: 9 (ref 5–15)
BUN: 9 mg/dL (ref 6–20)
CO2: 23 mmol/L (ref 22–32)
Calcium: 9.2 mg/dL (ref 8.9–10.3)
Chloride: 106 mmol/L (ref 98–111)
Creatinine, Ser: 0.86 mg/dL (ref 0.44–1.00)
GFR calc Af Amer: >60 mL/min (ref >60)
GFR calc non Af Amer: >60 mL/min (ref >60)
Glucose, Bld: 116 mg/dL — ABNORMAL HIGH (ref 70–99)
Potassium: 3.7 mmol/L (ref 3.5–5.1)
Sodium: 138 mmol/L (ref 135–145)
Total Bilirubin: 0.3 mg/dL (ref 0.3–1.2)
Total Protein: 7.2 g/dL (ref 6.5–8.1)

## 2019-07-21 LAB — CBC
HCT: 39 % (ref 36.0–46.0)
Hemoglobin: 12.7 g/dL (ref 12.0–15.0)
MCH: 27.5 pg (ref 26.0–34.0)
MCHC: 32.6 g/dL (ref 30.0–36.0)
MCV: 84.6 fL (ref 80.0–100.0)
Platelets: 317 10*3/uL (ref 150–400)
RBC: 4.61 MIL/uL (ref 3.87–5.11)
RDW: 14 % (ref 11.5–15.5)
WBC: 9 10*3/uL (ref 4.0–10.5)
nRBC: 0 % (ref 0.0–0.2)

## 2019-07-21 LAB — LIPASE, BLOOD: Lipase: 20 U/L (ref 11–51)

## 2019-07-21 LAB — I-STAT BETA HCG BLOOD, ED (MC, WL, AP ONLY): I-stat hCG, quantitative: <5 m[IU]/mL (ref <5)

## 2019-07-21 LAB — TYPE AND SCREEN
ABO/RH(D): O NEG
Antibody Screen: NEGATIVE

## 2019-07-21 LAB — POC OCCULT BLOOD, ED: Fecal Occult Bld: POSITIVE — AB

## 2019-07-21 MED ORDER — DICYCLOMINE HCL 10 MG PO CAPS
10.0000 mg | ORAL_CAPSULE | Freq: Once | ORAL | Status: AC
  Administered 2019-07-21: 10 mg via ORAL
  Filled 2019-07-21: qty 1

## 2019-07-21 MED ORDER — DICYCLOMINE HCL 20 MG PO TABS: 20.0000 mg | ORAL_TABLET | Freq: Two times a day (BID) | ORAL | 0 refills | Status: DC | PRN

## 2019-07-21 MED ORDER — ONDANSETRON 4 MG PO TBDP
4.0000 mg | ORAL_TABLET | Freq: Once | ORAL | Status: AC
  Administered 2019-07-21: 4 mg via ORAL
  Filled 2019-07-21: qty 1

## 2019-07-21 MED ORDER — ONDANSETRON 4 MG PO TBDP: 4.0000 mg | ORAL_TABLET | Freq: Three times a day (TID) | ORAL | 0 refills | Status: DC | PRN

## 2019-07-21 NOTE — ED Provider Notes (Signed)
[facility] EMERGENCY DEPARTMENT Provider Note   CSN: [id number] Arrival date & time: 07/21/19  6256     History   Chief Complaint Chief Complaint  Patient presents with  . GI Bleeding    HPI Denise Knight is a 53 y.o. female with a past medical history of bipolar disorder, HTN, hemorrhoids who presents to the emergency department with GI bleeding. Patient reports she started having crampy periumbilical abdominal pain yesterday and had a bowel movement yesterday with some bright red blood present and blood clots. Patient reports this morning having additional bowel movements with small blood clots present. Patient denies being on blood thinners. Patient reports the abdominal pain is worse when she is going to the bathroom. Patient has had some associated nausea and 1 episode of non-bloody emesis today. Patient reports she has known hemorrhoids and has had rectal bleeding before but not with clots. Patient denies dysuria, blood in urine, or vaginal symptoms. Patient has not had shortness of breath, syncope, or chest pain.     The history is provided by the patient and medical records.    Past Medical History:  Diagnosis Date  . Anxiety   . Arthritis    "left knee" (07/05/2016)  . Asthma   . Bipolar 1 disorder ([place])   . Depression   . GERD (gastroesophageal reflux disease)   . Hypertension   . MI (mitral incompetence)   . Post traumatic stress disorder   . Schizophrenia ([place])   . Stomach ulcer     Patient Active Problem List   Diagnosis Date Noted  . MDD (major depressive disorder), recurrent, severe, with psychosis ([place]) 06/01/2019  . Cocaine abuse ([place]) 12/27/2017  . Rectal bleeding 12/27/2017  . NSTEMI (non-ST elevated myocardial infarction) ([place]) 12/23/2017  . Acute viral bronchitis 06/14/2017  . Asthma exacerbation 06/14/2017  . Chest pain 07/05/2016    Past Surgical History:  Procedure Laterality Date  . CARDIAC CATHETERIZATION   07/05/2016  . CARDIAC CATHETERIZATION N/A 07/05/2016   Procedure: Left Heart Cath and Coronary Angiography;  Surgeon: Adrian Prows, MD;  Location: [facility];  Service: Cardiovascular;  Laterality: N/A;  . CYST EXCISION Right    "wrist"  . DILATION AND CURETTAGE OF UTERUS    . FOOT SURGERY Bilateral    "corns removed"  . LEFT HEART CATH AND CORONARY ANGIOGRAPHY N/A 12/24/2017   Procedure: LEFT HEART CATH AND CORONARY ANGIOGRAPHY;  Surgeon: Nigel Mormon, MD;  Location: [facility];  Service: Cardiovascular;  Laterality: N/A;  . TUBAL LIGATION       OB History   No obstetric history on file.      Home Medications    Prior to Admission medications   Medication Sig Start Date End Date Taking? Authorizing Provider  albuterol (PROVENTIL HFA;VENTOLIN HFA) 108 (90 BASE) MCG/ACT inhaler Inhale 2 puffs into the lungs every 6 (six) hours as needed for wheezing or shortness of breath.   Yes [provider]  amantadine (SYMMETREL) 100 MG capsule Take 1 capsule (100 mg total) by mouth 2 (two) times daily. 06/03/19  Yes Johnn Hai, MD  FLUoxetine (PROZAC) 20 MG capsule Take 1 capsule (20 mg total) by mouth daily. 06/04/19  Yes Johnn Hai, MD  nitroGLYCERIN (NITROSTAT) 0.4 MG SL tablet Place 1 tablet (0.4 mg total) under the tongue every 5 (five) minutes x 3 doses as needed for chest pain. 02/11/18  Yes Law, Bea Graff, PA-C  omeprazole (PRILOSEC) 20 MG capsule Take  20 mg by mouth daily.   Yes [provider]  dicyclomine (BENTYL) 20 MG tablet Take 1 tablet (20 mg total) by mouth 2 (two) times daily as needed for spasms. 07/21/19   Betsey Amen, MD  ondansetron (ZOFRAN ODT) 4 MG disintegrating tablet Take 1 tablet (4 mg total) by mouth every 8 (eight) hours as needed for nausea or vomiting. 07/21/19   Betsey Amen, MD    Family History Family History  Problem Relation Age of Onset  . Breast cancer Other   . Lung cancer Other   . Congestive Heart  Failure Other   . Diabetes Other   . Hypertension Other     Social History Social History   Tobacco Use  . Smoking status: Current Some Day Smoker    Packs/day: 0.50    Years: 35.00    Pack years: 17.50    Types: Cigarettes  . Smokeless tobacco: Never Used  Substance Use Topics  . Alcohol use: Yes    Alcohol/week: 7.0 standard drinks    Types: 7 Cans of beer per week    Comment: socially  . Drug use: Yes    Types: Cocaine    Comment: 2 days     Allergies   Aspirin, Food, and Morphine and related   Review of Systems Review of Systems  Constitutional: Negative for fever.  HENT: Negative for congestion and trouble swallowing.   Eyes: Negative for visual disturbance.  Respiratory: Negative for cough and shortness of breath.   Cardiovascular: Negative for chest pain.  Gastrointestinal: Positive for abdominal pain, blood in stool, nausea and vomiting. Negative for constipation.  Genitourinary: Negative for dysuria, vaginal bleeding and vaginal discharge.  Musculoskeletal: Negative for back pain.  Skin: Negative for rash.  Neurological: Negative for tremors, syncope and numbness.  Psychiatric/Behavioral: Negative for confusion.     Physical Exam Updated Vital Signs BP (!) 135/91   Pulse 78   Temp 98.9 F (37.2 C) (Oral)   Resp 16   Ht 5' 8"  (1.727 m)   Wt 72.6 kg   SpO2 98%   BMI 24.33 kg/m   Physical Exam Constitutional:      General: She is not in acute distress. HENT:     Head: Normocephalic and atraumatic.     Right Ear: External ear normal.     Left Ear: External ear normal.     Nose: Nose normal.     Mouth/Throat:     Mouth: Mucous membranes are moist.     Pharynx: Oropharynx is clear.  Eyes:     Pupils: Pupils are equal, round, and reactive to light.  Cardiovascular:     Rate and Rhythm: Normal rate and regular rhythm.     Pulses: Normal pulses.  Pulmonary:     Effort: Pulmonary effort is normal. No respiratory distress.     Breath sounds:  Normal breath sounds.     Comments: Mild tachypnea Abdominal:     General: There is no distension.     Palpations: Abdomen is soft.     Tenderness: There is abdominal tenderness (peri-umbilical/suprapubic). There is no guarding or rebound.  Genitourinary:    Rectum: Guaiac result positive. External hemorrhoid present.     Comments: No gross blood on rectal exam Musculoskeletal:     Right lower leg: No edema.     Left lower leg: No edema.  Skin:    General: Skin is warm and dry.  Neurological:     General: No focal  deficit present.     Mental Status: She is alert and oriented to person, place, and time.     Cranial Nerves: No cranial nerve deficit.     Sensory: No sensory deficit.     Motor: No weakness.     Coordination: Coordination normal.      ED Treatments / Results  Labs (all labs ordered are listed, but only abnormal results are displayed) Labs Reviewed  COMPREHENSIVE METABOLIC PANEL - Abnormal; Notable for the following components:      Result Value   Glucose, Bld 116 (*)    Albumin 3.2 (*)    All other components within normal limits  POC OCCULT BLOOD, ED - Abnormal; Notable for the following components:   Fecal Occult Bld POSITIVE (*)    All other components within normal limits  CBC  LIPASE, BLOOD  I-STAT BETA HCG BLOOD, ED ([facility], [facility], [facility] ONLY)  TYPE AND SCREEN    EKG None  Radiology No results found.  Procedures Procedures (including critical care time)  Medications Ordered in ED Medications  ondansetron (ZOFRAN-ODT) disintegrating tablet 4 mg (4 mg Oral Given 07/21/19 1747)  dicyclomine (BENTYL) capsule 10 mg (10 mg Oral Given 07/21/19 1747)     Initial Impression / Assessment and Plan / ED Course  I have reviewed the triage vital signs and the nursing notes.  Pertinent labs & imaging results that were available during my care of the patient were reviewed by me and considered in my medical decision making (see chart for details).       Concern  for lower GI bleed. No gross blood or clots on rectal exam, stool brown and guaiac positive. Vital signs stable. Hb stable. Patient given zofran and bentyl for symptom control. Patient has had numerous CT scans with in the last year for similar presentations, benign abdominal exam, repeat CT abdomen not currently clinically indicated. Referral placed to GI for outpatient follow up. All questions answered and strict return precautions given. Patient given prescription for zofran and bentyl for symptom control. Patient comfortable with plan to discharge home and follow up with GI.  Patient seen and plan discussed with Dr. Tyrone Nine.  Final Clinical Impressions(s) / ED Diagnoses   Final diagnoses:  Gastrointestinal hemorrhage, unspecified gastrointestinal hemorrhage type    ED Discharge Orders         Ordered    ondansetron (ZOFRAN ODT) 4 MG disintegrating tablet  Every 8 hours PRN     07/21/19 1759    dicyclomine (BENTYL) 20 MG tablet  2 times daily PRN     07/21/19 1759           Betsey Amen, MD 07/22/19 0106    Deno Etienne, DO 07/24/19 7628

## 2019-07-21 NOTE — ED Triage Notes (Signed)
Pt states she has been having abd pain since last night and started having bright red blood/clots when she has a BM. Pt feels nauseous and vomited once.

## 2019-07-21 NOTE — ED Notes (Signed)
Patient verbalizes understanding of discharge instructions. Opportunity for questioning and answers were provided. Armband removed by staff, pt discharged from ED.  

## 2019-07-21 NOTE — ED Notes (Signed)
Pt updated on wait for treatment room.

## 2019-07-22 ENCOUNTER — Encounter: Payer: Self-pay | Admitting: Gastroenterology

## 2019-08-06 ENCOUNTER — Ambulatory Visit (AMBULATORY_SURGERY_CENTER): Payer: Self-pay | Admitting: *Deleted

## 2019-08-06 ENCOUNTER — Other Ambulatory Visit: Payer: Self-pay

## 2019-08-06 VITALS — Temp 96.9°F | Ht 68.5 in | Wt 168.2 lb

## 2019-08-06 DIAGNOSIS — Z1211 Encounter for screening for malignant neoplasm of colon: Secondary | ICD-10-CM

## 2019-08-06 MED ORDER — PEG 3350-KCL-NA BICARB-NACL 420 G PO SOLR: 4000.0000 mL | Freq: Once | ORAL | 0 refills | Status: AC

## 2019-08-06 NOTE — Progress Notes (Signed)
No egg or soy allergy known to patient  No issues with past sedation with any surgeries  or procedures, no intubation problems  No diet pills per patient No home 02 use per patient  No blood thinners per patient  Pt denies issues with constipation  No A fib or A flutter  EMMI video sent to pt's e mail   Due to the COVID-19 pandemic we are asking patients to follow these guidelines. Please only bring one care partner. Please be aware that your care partner may wait in the car in the parking lot or if they feel like they will be too hot to wait in the car, they may wait in the lobby on the 4th floor. All care partners are required to wear a mask the entire time (we do not have any that we can provide them), they need to practice social distancing, and we will do a Covid check for all patient's and care partners when you arrive. Also we will check their temperature and your temperature. If the care partner waits in their car they need to stay in the parking lot the entire time and we will call them on their cell phone when the patient is ready for discharge so they can bring the car to the front of the building. Also all patient's will need to wear a mask into building.

## 2019-08-17 ENCOUNTER — Other Ambulatory Visit: Payer: Self-pay

## 2019-08-17 ENCOUNTER — Encounter (HOSPITAL_COMMUNITY): Payer: Self-pay | Admitting: Emergency Medicine

## 2019-08-17 ENCOUNTER — Emergency Department (HOSPITAL_COMMUNITY)

## 2019-08-17 ENCOUNTER — Emergency Department (HOSPITAL_COMMUNITY)
Admission: EM | Admit: 2019-08-17 | Discharge: 2019-08-17 | Disposition: A | Discharge status: Home / Self Care | Attending: Emergency Medicine | Admitting: Emergency Medicine

## 2019-08-17 DIAGNOSIS — Z87891 Personal history of nicotine dependence: Secondary | ICD-10-CM | POA: Insufficient documentation

## 2019-08-17 DIAGNOSIS — M4802 Spinal stenosis, cervical region: Secondary | ICD-10-CM | POA: Insufficient documentation

## 2019-08-17 DIAGNOSIS — M79602 Pain in left arm: Secondary | ICD-10-CM | POA: Insufficient documentation

## 2019-08-17 DIAGNOSIS — Z79899 Other long term (current) drug therapy: Secondary | ICD-10-CM | POA: Insufficient documentation

## 2019-08-17 DIAGNOSIS — I10 Essential (primary) hypertension: Secondary | ICD-10-CM | POA: Insufficient documentation

## 2019-08-17 DIAGNOSIS — J45909 Unspecified asthma, uncomplicated: Secondary | ICD-10-CM | POA: Diagnosis not present

## 2019-08-17 DIAGNOSIS — F419 Anxiety disorder, unspecified: Secondary | ICD-10-CM | POA: Insufficient documentation

## 2019-08-17 DIAGNOSIS — R200 Anesthesia of skin: Secondary | ICD-10-CM | POA: Diagnosis present

## 2019-08-17 LAB — COMPREHENSIVE METABOLIC PANEL
ALT: 16 U/L (ref 0–44)
AST: 17 U/L (ref 15–41)
Albumin: 3.3 g/dL — ABNORMAL LOW (ref 3.5–5.0)
Alkaline Phosphatase: 62 U/L (ref 38–126)
Anion gap: 7 (ref 5–15)
BUN: 24 mg/dL — ABNORMAL HIGH (ref 6–20)
CO2: 23 mmol/L (ref 22–32)
Calcium: 9.1 mg/dL (ref 8.9–10.3)
Chloride: 109 mmol/L (ref 98–111)
Creatinine, Ser: 1.28 mg/dL — ABNORMAL HIGH (ref 0.44–1.00)
GFR calc Af Amer: 55 mL/min — ABNORMAL LOW (ref >60)
GFR calc non Af Amer: 47 mL/min — ABNORMAL LOW (ref >60)
Glucose, Bld: 98 mg/dL (ref 70–99)
Potassium: 3.8 mmol/L (ref 3.5–5.1)
Sodium: 139 mmol/L (ref 135–145)
Total Bilirubin: 0.4 mg/dL (ref 0.3–1.2)
Total Protein: 6.6 g/dL (ref 6.5–8.1)

## 2019-08-17 LAB — URINALYSIS, ROUTINE W REFLEX MICROSCOPIC
Bacteria, UA: NONE SEEN
Bilirubin Urine: NEGATIVE
Glucose, UA: NEGATIVE mg/dL
Ketones, ur: NEGATIVE mg/dL
Nitrite: NEGATIVE
Protein, ur: NEGATIVE mg/dL
Specific Gravity, Urine: 1.02 (ref 1.005–1.030)
pH: 6 (ref 5.0–8.0)

## 2019-08-17 LAB — I-STAT CHEM 8, ED
BUN: 26 mg/dL — ABNORMAL HIGH (ref 6–20)
Calcium, Ion: 1.24 mmol/L (ref 1.15–1.40)
Chloride: 109 mmol/L (ref 98–111)
Creatinine, Ser: 1.2 mg/dL — ABNORMAL HIGH (ref 0.44–1.00)
Glucose, Bld: 96 mg/dL (ref 70–99)
HCT: 37 % (ref 36.0–46.0)
Hemoglobin: 12.6 g/dL (ref 12.0–15.0)
Potassium: 3.9 mmol/L (ref 3.5–5.1)
Sodium: 143 mmol/L (ref 135–145)
TCO2: 22 mmol/L (ref 22–32)

## 2019-08-17 LAB — CBC
HCT: 36.5 % (ref 36.0–46.0)
Hemoglobin: 11.3 g/dL — ABNORMAL LOW (ref 12.0–15.0)
MCH: 26.9 pg (ref 26.0–34.0)
MCHC: 31 g/dL (ref 30.0–36.0)
MCV: 86.9 fL (ref 80.0–100.0)
Platelets: 242 10*3/uL (ref 150–400)
RBC: 4.2 MIL/uL (ref 3.87–5.11)
RDW: 15 % (ref 11.5–15.5)
WBC: 6.8 10*3/uL (ref 4.0–10.5)
nRBC: 0 % (ref 0.0–0.2)

## 2019-08-17 LAB — DIFFERENTIAL
Abs Immature Granulocytes: 0.01 10*3/uL (ref 0.00–0.07)
Basophils Absolute: 0.1 10*3/uL (ref 0.0–0.1)
Basophils Relative: 1 %
Eosinophils Absolute: 0.3 10*3/uL (ref 0.0–0.5)
Eosinophils Relative: 4 %
Immature Granulocytes: 0 %
Lymphocytes Relative: 51 %
Lymphs Abs: 3.4 10*3/uL (ref 0.7–4.0)
Monocytes Absolute: 0.5 10*3/uL (ref 0.1–1.0)
Monocytes Relative: 7 %
Neutro Abs: 2.5 10*3/uL (ref 1.7–7.7)
Neutrophils Relative %: 37 %

## 2019-08-17 LAB — PROTIME-INR
INR: 0.9 (ref 0.8–1.2)
Prothrombin Time: 12.2 seconds (ref 11.4–15.2)

## 2019-08-17 LAB — RAPID URINE DRUG SCREEN, HOSP PERFORMED
Amphetamines: NOT DETECTED
Barbiturates: NOT DETECTED
Benzodiazepines: NOT DETECTED
Cocaine: POSITIVE — AB
Opiates: NOT DETECTED
Tetrahydrocannabinol: POSITIVE — AB

## 2019-08-17 LAB — ETHANOL: Alcohol, Ethyl (B): <10 mg/dL (ref <10)

## 2019-08-17 LAB — I-STAT BETA HCG BLOOD, ED (MC, WL, AP ONLY): I-stat hCG, quantitative: <5 m[IU]/mL (ref <5)

## 2019-08-17 LAB — APTT: aPTT: 39 seconds — ABNORMAL HIGH (ref 24–36)

## 2019-08-17 IMAGING — MR MR HEAD W/O CM
11 of 12 series · 44 of 48 positions shown · non-contrast
Comparison: Head CT earlier today. Brain MRI [DATE].

CLINICAL DATA: 52-year-old female with left upper extremity
numbness and tingling. Symptom onset since 3 a.m. yesterday.

EXAM:
MRI HEAD WITHOUT CONTRAST
TECHNIQUE: Multiplanar, multiecho pulse sequences of the brain and surrounding
structures were obtained without intravenous contrast.

[Series 5: DWI · axial · 3.0mm · 0.88mm/px · z∈[-81,+53]mm · 10 of 92 slices shown (1 of 4)]
[im 1/92]
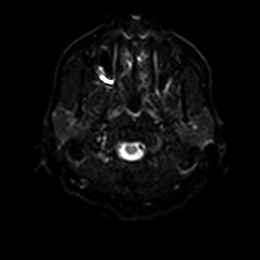
[im 11/92]
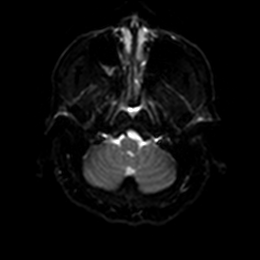
[im 21/92]
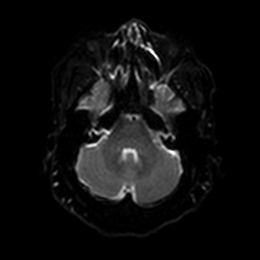
[im 31/92]
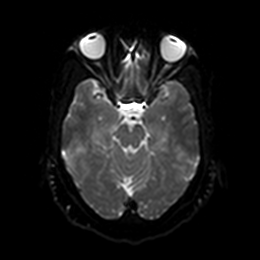
[im 41/92]
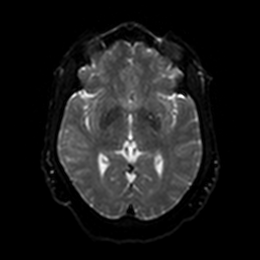
[im 51/92]
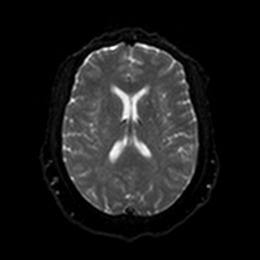
[im 61/92]
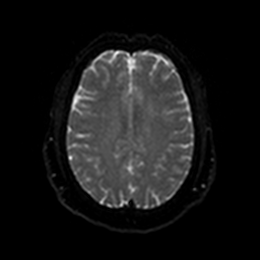
[im 71/92]
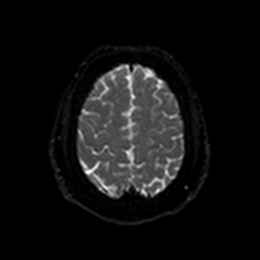
[im 81/92]
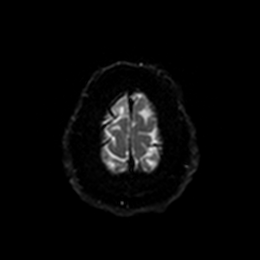
[im 92/92]
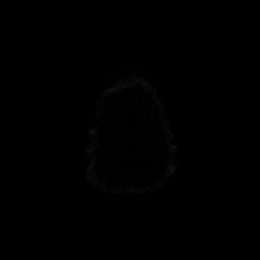

[Series 6: DWI · axial · 3.0mm · 0.88mm/px · z∈[-81,+53]mm · 5 of 46 slices shown (2 of 4)]
[im 1/46]
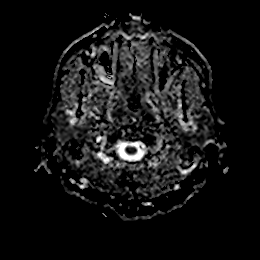
[im 12/46]
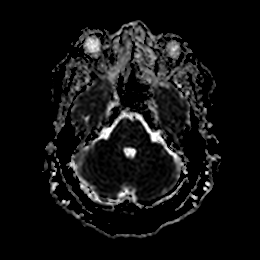
[im 23/46]
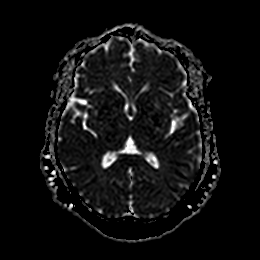
[im 34/46]
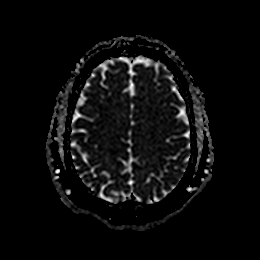
[im 46/46]
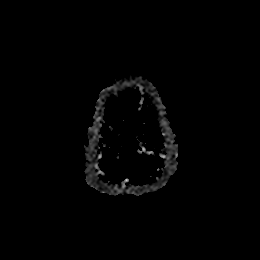

[Series 7: DWI · coronal · 4.0mm · 0.88mm/px · 6 of 64 slices shown (3 of 4)]
[im 1/64]
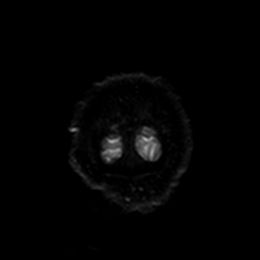
[im 13/64]
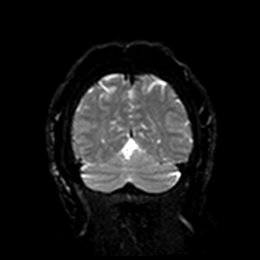
[im 26/64]
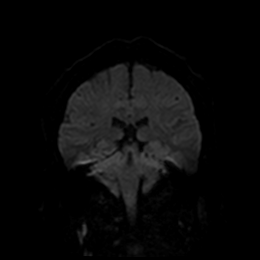
[im 38/64]
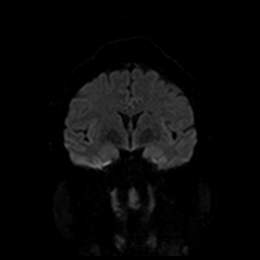
[im 51/64]
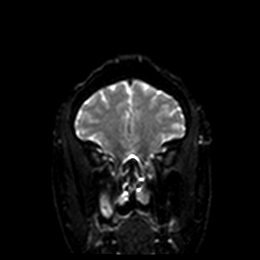
[im 64/64]
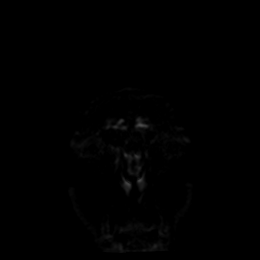

[Series 8: DWI · coronal · 4.0mm · 0.88mm/px · 3 of 32 slices shown (4 of 4)]
[im 1/32]
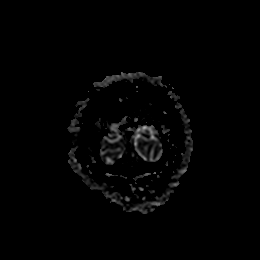
[im 16/32]
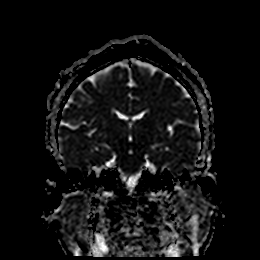
[im 32/32]
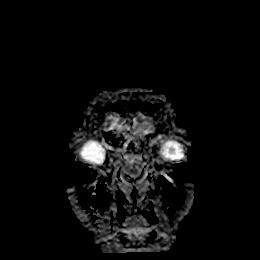

[Series 9: T1 · sagittal · 5.0mm · 0.75mm/px · 2 of 23 slices shown]
[im 1/23]
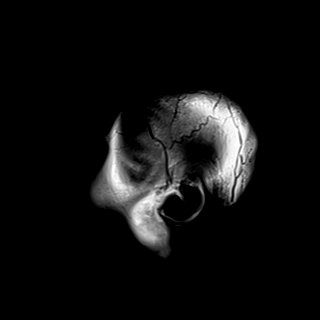
[im 23/23]
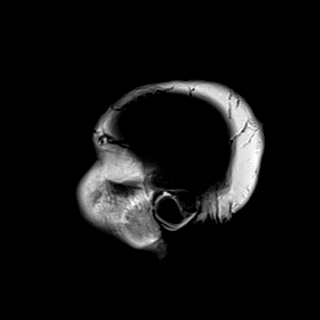

[Series 10: T2 · axial · 5.0mm · 0.72mm/px · z∈[-88,+43]mm · 2 of 23 slices shown (1 of 2)]
[im 1/23]
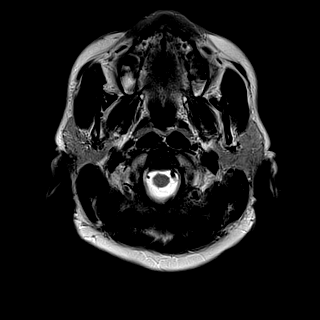
[im 23/23]
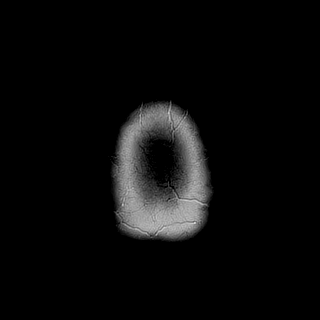

[Series 11: mag_images · axial · 3.0mm · 0.90mm/px · z∈[-78,+51]mm · 4 of 44 slices shown]
[im 1/44]
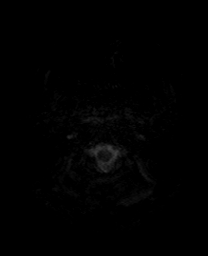
[im 15/44]
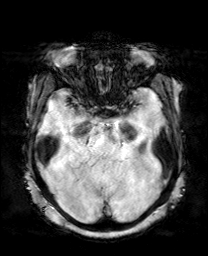
[im 29/44]
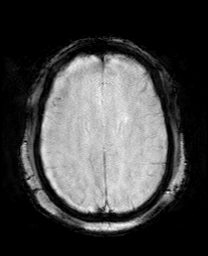
[im 44/44]
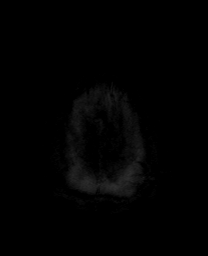

[Series 12: pha_images · axial · 3.0mm · 0.90mm/px · z∈[-78,+51]mm · 4 of 44 slices shown]
[im 1/44]
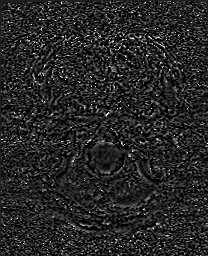
[im 15/44]
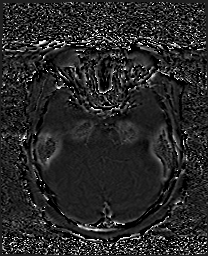
[im 29/44]
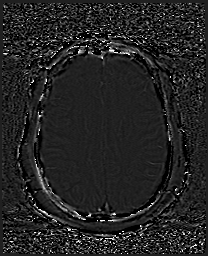
[im 44/44]
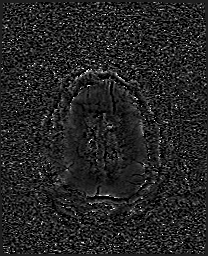

[Series 13: swi_images · axial · 3.0mm · 0.90mm/px · z∈[-78,+51]mm · 4 of 44 slices shown]
[im 1/44]
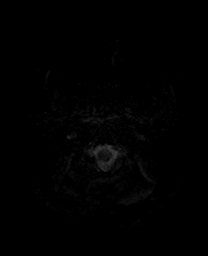
[im 15/44]
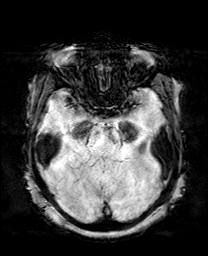
[im 29/44]
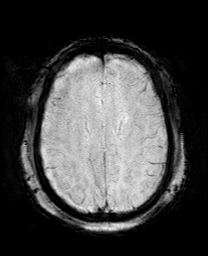
[im 44/44]
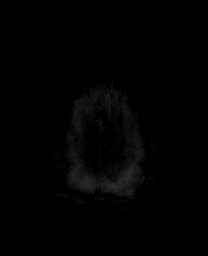

[Series 15: FLAIR · axial · 5.0mm · 0.90mm/px · z∈[-86,+58]mm · 2 of 25 slices shown]
[im 1/25]
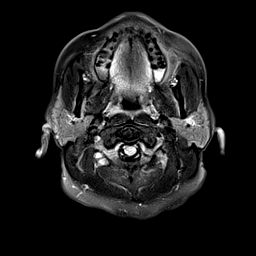
[im 25/25]
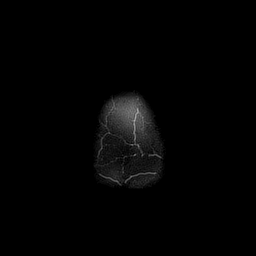

[Series 17: T2 · coronal · 5.0mm · 0.34mm/px · 2 of 26 slices shown (2 of 2)]
[im 1/26]
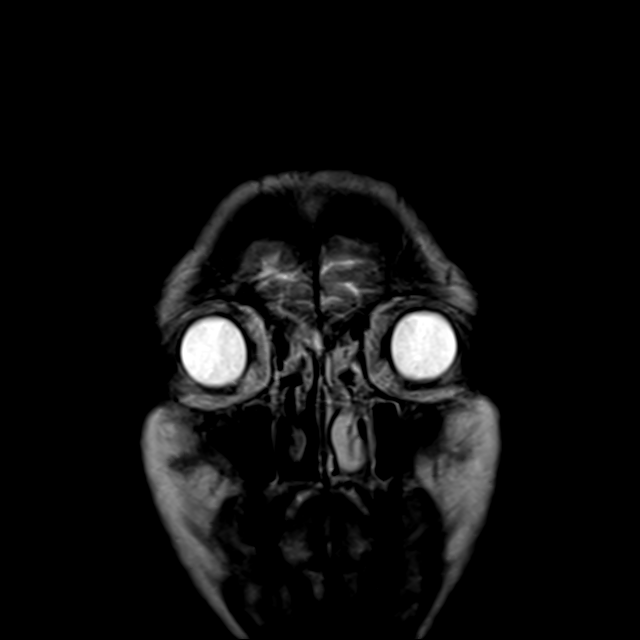
[im 26/26]
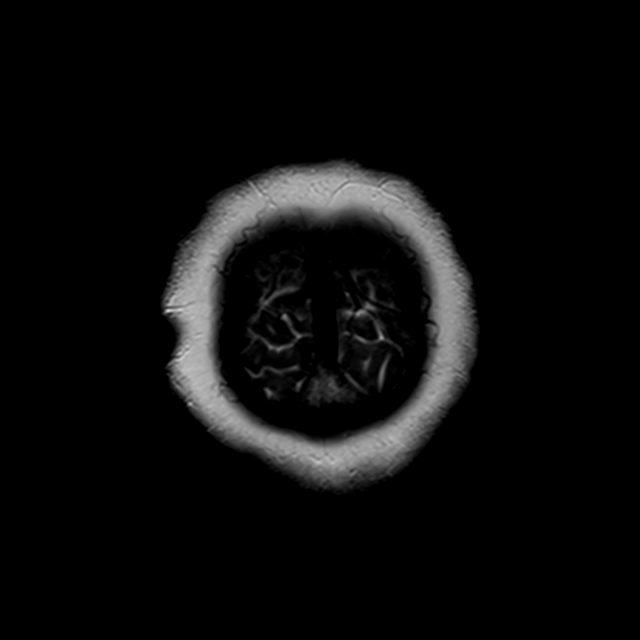

[44 of 48 positions shown; findings below may reference images not displayed]

FINDINGS: Brain: Cerebral volume remains normal. No restricted diffusion to
suggest acute infarction. No midline shift, mass effect, evidence of
mass lesion, ventriculomegaly, extra-axial collection or acute
intracranial hemorrhage. Cervicomedullary junction and pituitary are
within normal limits.

Stable mild for age nonspecific cerebral white matter signal changes
most pronounced in the left corona radiata, and resembling sequelae
of small vessel disease there. No cortical encephalomalacia or
chronic cerebral blood products identified. The deep gray nuclei,
brainstem and cerebellum remain negative.

Vascular: Major intracranial vascular flow voids are stable. The
left vertebral artery is mildly dominant.

Skull and upper cervical spine: Cervical spine findings are reported
separately. Visualized bone marrow signal is within normal limits.

Sinuses/Orbits: Negative orbits. Mild to moderate bilateral
paranasal sinus mucosal thickening and small fluid level in the
right maxillary sinus have not significantly changed since [REDACTED].

Other: Mastoids remain clear. Grossly negative visible internal
auditory structures. Scalp and face soft tissues appear negative.
IMPRESSION: 1. No acute intracranial abnormality.
2. Stable noncontrast MRI appearance of the brain since [REDACTED] with
mild cerebral white matter disease resembling chronic small vessel
ischemia in the left corona radiata.
3. Mild to moderate paranasal sinus inflammation, stable.

## 2019-08-17 MED ORDER — GABAPENTIN 100 MG PO CAPS
100.0000 mg | ORAL_CAPSULE | Freq: Once | ORAL | Status: AC
  Administered 2019-08-17: 100 mg via ORAL
  Filled 2019-08-17: qty 1

## 2019-08-17 MED ORDER — PREDNISONE 10 MG (21) PO TBPK: ORAL_TABLET | Freq: Every day | ORAL | 0 refills | Status: DC

## 2019-08-17 MED ORDER — DIAZEPAM 5 MG PO TABS
5.0000 mg | ORAL_TABLET | Freq: Once | ORAL | Status: AC
  Administered 2019-08-17: 5 mg via ORAL
  Filled 2019-08-17: qty 1

## 2019-08-17 NOTE — ED Notes (Signed)
Patient transported to MRI 

## 2019-08-17 NOTE — ED Triage Notes (Signed)
Patient with left arm numbness that started on 08/16/2019 at 0300.  Patient states that it has gotten worse in the last few hours, woke her from sleep.

## 2019-08-17 NOTE — ED Provider Notes (Signed)
MSE was initiated and I personally evaluated the patient and placed orders (if any) at  5:16 AM on August 17, 2019.  The patient appears stable so that the remainder of the MSE may be completed by another provider.  Called to screen patient when she was being triaged.  Reported numbness tingling left upper extremity x24 hours.  Onset of symptoms yesterday at 3 AM.  Reports numbness and pins-and-needles sensation throughout the extremity worse in the fingers.  Also has experienced some weakness in the left hand.  Denies chest pain, shortness of breath, neck pain, fevers.  Reports history of hypertension.  On my evaluation, she appears uncomfortable.  Diminished grip and biceps strength on the left at 4+ out of 5.  She is out of the window for a code stroke.  Will place stroke order set and have her further evaluated.   Merryl Hacker, MD 08/17/19 [phone]

## 2019-08-17 NOTE — ED Provider Notes (Signed)
Signout from Dr. Dina Rich.  53 year old female with left arm tingling weakness and pain.  Symptoms started greater than 24 hours ago not a stroke activation.  CT head negative.  Pending MRI brain and cervical spine.  If no acute findings can be discharged to follow-up outpatient. Physical Exam  BP 132/82 (BP Location: Right Arm)   Pulse 82   Temp 98.1 F (36.7 C) (Oral)   Resp (!) 22   SpO2 99%   Physical Exam  ED Course/Procedures     Procedures  MDM   Patient's MRI brain does not show any acute findings.  Her MRI C-spine does show diffuse canal stenosis.  I reviewed this with neurosurgery Dr. Annette Stable via their nurse practitioner and the recommendation was for outpatient follow-up 1 to 2 weeks in the office and steroid taper      Denise Rasmussen, MD 08/17/19 1658

## 2019-08-17 NOTE — Discharge Instructions (Signed)
You were seen in the emergency department for evaluation of left hand paresthesias or pins-and-needles and pain.  You had an MRI of your brain that was unremarkable but your MRI cervical spine showed a lot of degenerative changes in your cervical spine.  We reviewed your case with Dr. Annette Stable from neurosurgery who recommended that you go on a steroid taper and follow-up with him in the next 1 to 2 weeks.  Please also follow-up with your doctor and return if any worsening symptoms.

## 2019-08-17 NOTE — ED Notes (Signed)
Pt given dc instructions pt verbalizes understanding.  

## 2019-08-17 NOTE — ED Notes (Signed)
Pt resting waiting on MRI to come get her for her scan

## 2019-08-17 NOTE — ED Provider Notes (Signed)
[facility] EMERGENCY DEPARTMENT Provider Note   CSN: [id number] Arrival date & time: 08/17/19  0458     History   Chief Complaint Chief Complaint  Patient presents with  . Numbness    HPI Denise Knight is a 53 y.o. female.     HPI  This a 53 year old female with a history of hypertension, PTSD, schizophrenia, polysubstance abuse who presents with left arm pain and numbness.  Patient reports onset of symptoms 3 AM yesterday over 24 hours ago.  Denise Knight reports numbness and tingling sensation in the left arm and hand.  Denise Knight states Denise Knight has difficulty feeling the tips of her first through third digits.  Denise Knight also has noted weakness in that hand.  Denise Knight is right-handed.  Denise Knight has not noted any leg weakness.  Did not note any speech difficulty or other strokelike symptoms.  Denies any neck pain or injury.  Patient denies any recent fevers, cough, shortness of breath, abdominal pain, nausea or vomiting.  Past Medical History:  Diagnosis Date  . Allergy    seasonal  . Anxiety   . Arthritis    "left knee" (07/05/2016)  . Asthma   . Bipolar 1 disorder ([place])   . Depression   . GERD (gastroesophageal reflux disease)   . Hypertension   . MI (mitral incompetence)   . Post traumatic stress disorder   . Schizophrenia ([place])   . Stomach ulcer   . Substance abuse [facility])    cocaine quit july 2020    Patient Active Problem List   Diagnosis Date Noted  . 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 Cocaine abuse ([place]) 12/27/2017  . Rectal bleeding 12/27/2017  . NSTEMI (non-ST elevated myocardial infarction) ([place]) 12/23/2017  . Acute viral bronchitis 06/14/2017  . Asthma exacerbation 06/14/2017  . Chest pain 07/05/2016    Past Surgical History:  Procedure Laterality Date  . CARDIAC CATHETERIZATION  07/05/2016  . CARDIAC CATHETERIZATION N/A 07/05/2016   Procedure: Left Heart Cath and Coronary Angiography;  Surgeon: Adrian Prows,  MD;  Location: [facility];  Service: Cardiovascular;  Laterality: N/A;  . CYST EXCISION Right    "wrist"  . DILATION AND CURETTAGE OF UTERUS    . FOOT SURGERY Bilateral    "corns removed"  . LEFT HEART CATH AND CORONARY ANGIOGRAPHY N/A 12/24/2017   Procedure: LEFT HEART CATH AND CORONARY ANGIOGRAPHY;  Surgeon: Nigel Mormon, MD;  Location: [facility];  Service: Cardiovascular;  Laterality: N/A;  . TUBAL LIGATION       OB History   No obstetric history on file.      Home Medications    Prior to Admission medications   Medication Sig Start Date End Date Taking? Authorizing Provider  albuterol (PROVENTIL HFA;VENTOLIN HFA) 108 (90 BASE) MCG/ACT inhaler Inhale 2 puffs into the lungs every 6 (six) hours as needed for wheezing or shortness of breath.    [provider]  amantadine (SYMMETREL) 100 MG capsule Take 1 capsule (100 mg total) by mouth 2 (two) times daily. 06/03/19   Johnn Hai, MD  dicyclomine (BENTYL) 20 MG tablet Take 1 tablet (20 mg total) by mouth 2 (two) times daily as needed for spasms. 07/21/19   Betsey Amen, MD  FLUoxetine (PROZAC) 20 MG capsule Take 1 capsule (20 mg total) by mouth daily. 06/04/19   Johnn Hai, MD  nitroGLYCERIN (NITROSTAT) 0.4 MG SL tablet Place 1 tablet (0.4 mg total) under the  tongue every 5 (five) minutes x 3 doses as needed for chest pain. Patient not taking: Reported on 08/06/2019 02/11/18   Frederica Kuster, PA-C  omeprazole (PRILOSEC) 20 MG capsule Take 20 mg by mouth daily.    [provider]  OVER THE COUNTER MEDICATION     [provider]    Family History Family History  Problem Relation Age of Onset  . Breast cancer Other   . Lung cancer Other   . Congestive Heart Failure Other   . Diabetes Other   . Hypertension Other   . Colon cancer Neg Hx   . Colon polyps Neg Hx   . Esophageal cancer Neg Hx   . Stomach cancer Neg Hx   . Rectal cancer Neg Hx     Social History Social  History   Tobacco Use  . Smoking status: Former Smoker    Packs/day: 0.50    Years: 35.00    Pack years: 17.50    Types: Cigarettes    Quit date: 05/30/2019    Years since quitting: 0.2  . Smokeless tobacco: Never Used  Substance Use Topics  . Alcohol use: Not Currently    Alcohol/week: 7.0 standard drinks    Types: 7 Cans of beer per week    Comment: socially,stopped july 2020  . Drug use: Yes    Types: Cocaine    Comment: 2 days,july 2020 last used,08/06/19     Allergies   Aspirin, Food, and Morphine and related   Review of Systems Review of Systems  Constitutional: Negative for fever.  Respiratory: Negative for shortness of breath.   Cardiovascular: Negative for chest pain.  Gastrointestinal: Negative for abdominal pain, constipation, nausea and vomiting.  Musculoskeletal: Negative for neck pain.  Neurological: Positive for weakness and numbness.  All other systems reviewed and are negative.    Physical Exam Updated Vital Signs BP 132/82 (BP Location: Right Arm)   Pulse 82   Temp 98.1 F (36.7 C) (Oral)   Resp (!) 22   SpO2 99%   Physical Exam Vitals signs and nursing note reviewed.  Constitutional:      Appearance: Denise Knight is well-developed.     Comments: Anxious appearing  HENT:     Head: Normocephalic and atraumatic.     Nose: Nose normal.     Mouth/Throat:     Mouth: Mucous membranes are moist.  Eyes:     Pupils: Pupils are equal, round, and reactive to light.  Neck:     Musculoskeletal: Neck supple. No muscular tenderness.  Cardiovascular:     Rate and Rhythm: Normal rate and regular rhythm.     Heart sounds: Normal heart sounds.  Pulmonary:     Effort: Pulmonary effort is normal. No respiratory distress.     Breath sounds: No wheezing.  Abdominal:     General: Bowel sounds are normal.     Palpations: Abdomen is soft.     Tenderness: There is no abdominal tenderness.  Musculoskeletal:     Right lower leg: No edema.     Left lower leg: No  edema.  Skin:    General: Skin is warm and dry.  Neurological:     Mental Status: Denise Knight is alert and oriented to person, place, and time.     Comments: Cranial nerves II through XII intact, 5 out of 5 strength bilateral lower extremities, 4+ out of 5 left grip and biceps strength, otherwise 5 out of 5 strength upper extremities, sensation grossly intact, normal  and symmetric reflexes bilaterally  Psychiatric:     Comments: anxious      ED Treatments / Results  Labs (all labs ordered are listed, but only abnormal results are displayed) Labs Reviewed  APTT - Abnormal; Notable for the following components:      Result Value   aPTT 39 (*)    All other components within normal limits  CBC - Abnormal; Notable for the following components:   Hemoglobin 11.3 (*)    All other components within normal limits  COMPREHENSIVE METABOLIC PANEL - Abnormal; Notable for the following components:   BUN 24 (*)    Creatinine, Ser 1.28 (*)    Albumin 3.3 (*)    GFR calc non Af Amer 47 (*)    GFR calc Af Amer 55 (*)    All other components within normal limits  RAPID URINE DRUG SCREEN, HOSP PERFORMED - Abnormal; Notable for the following components:   Cocaine POSITIVE (*)    Tetrahydrocannabinol POSITIVE (*)    All other components within normal limits  URINALYSIS, ROUTINE W REFLEX MICROSCOPIC - Abnormal; Notable for the following components:   APPearance HAZY (*)    Hgb urine dipstick SMALL (*)    Leukocytes,Ua LARGE (*)    All other components within normal limits  I-STAT CHEM 8, ED - Abnormal; Notable for the following components:   BUN 26 (*)    Creatinine, Ser 1.20 (*)    All other components within normal limits  ETHANOL  PROTIME-INR  DIFFERENTIAL  I-STAT BETA HCG BLOOD, ED ([facility], [facility], [facility] ONLY)    EKG EKG Interpretation  Date/Time:  Tuesday August 17 2019 05:09:56 EDT Ventricular Rate:  79 PR Interval:  162 QRS Duration: 66 QT Interval:  378 QTC Calculation: 433 R Axis:   38  Text Interpretation:  Normal sinus rhythm No significant change since last tracing Confirmed by Thayer Jew [phone]) on 08/17/2019 5:38:21 AM   Radiology Ct Head Wo Contrast  Result Date: 08/17/2019 CLINICAL DATA:  Left upper extremity numbness and tingling EXAM: CT HEAD WITHOUT CONTRAST TECHNIQUE: Contiguous axial images were obtained from the base of the skull through the vertex without intravenous contrast. COMPARISON:  05/31/2019 CT.  04/13/2019 brain MRI FINDINGS: Brain: No evidence of acute infarction, hemorrhage, hydrocephalus, extra-axial collection or mass lesion/mass effect. Vascular: No hyperdense vessel or unexpected calcification. Skull: Normal. Negative for fracture or focal lesion. Sinuses/Orbits: No acute finding. IMPRESSION: Normal head CT Electronically Signed   By: Monte Fantasia M.D.   On: 08/17/2019 05:49    Procedures Procedures (including critical care time)  Medications Ordered in ED Medications  diazepam (VALIUM) tablet 5 mg (has no administration in time range)     Initial Impression / Assessment and Plan / ED Course  I have reviewed the triage vital signs and the nursing notes.  Pertinent labs & imaging results that were available during my care of the patient were reviewed by me and considered in my medical decision making (see chart for details).        Patient presents with left arm numbness and pain.  Overall nontoxic-appearing and vital signs are reassuring.  Symptoms for greater than 24 hours.  Neurologic exam with slight decreased strength with grip and biceps strength on the left.  No other signs of stroke.  Denies neck pain.  Considerations include stroke, cervical radiculopathy.  Patient with history of polysubstance abuse and is cocaine positive as well.  Denise Knight denies chest pain and EKG is without ischemia.  Initial CT scan reviewed and negative.  Will obtain MRI brain and cervical spine given positive neuro signs.  UDS positive for cocaine and  marijuana.  Otherwise lab work-up is largely reassuring.  Patient signed out to oncoming provider.  Final Clinical Impressions(s) / ED Diagnoses   Final diagnoses:  Left arm numbness    ED Discharge Orders    None       Merryl Hacker, MD 08/17/19 [phone]

## 2019-08-19 ENCOUNTER — Telehealth: Payer: Self-pay

## 2019-08-19 NOTE — Telephone Encounter (Signed)
Covid-19 screening questions   Do you now or have you had a fever in the last 14 days? NO   Do you have any respiratory symptoms of shortness of breath or cough now or in the last 14 days? NO   Do you have any family members or close contacts with diagnosed or suspected Covid-19 in the past 14 days? NO  Have you been tested for Covid-19 and found to be positive? NO

## 2019-08-20 ENCOUNTER — Encounter: Payer: Self-pay | Admitting: Gastroenterology

## 2019-08-20 ENCOUNTER — Ambulatory Visit (AMBULATORY_SURGERY_CENTER): Admitting: Gastroenterology

## 2019-08-20 ENCOUNTER — Other Ambulatory Visit: Payer: Self-pay

## 2019-08-20 VITALS — BP 146/91 | HR 66 | Temp 98.3°F | Resp 17 | Ht 68.5 in | Wt 168.0 lb

## 2019-08-20 DIAGNOSIS — Z1211 Encounter for screening for malignant neoplasm of colon: Secondary | ICD-10-CM

## 2019-08-20 DIAGNOSIS — Z86010 Personal history of colonic polyps: Secondary | ICD-10-CM

## 2019-08-20 DIAGNOSIS — D122 Benign neoplasm of ascending colon: Secondary | ICD-10-CM

## 2019-08-20 MED ORDER — SODIUM CHLORIDE 0.9 % IV SOLN: 500.0000 mL | Freq: Once | INTRAVENOUS | Status: DC

## 2019-08-20 NOTE — Op Note (Signed)
[facility] Patient Name: Denise Knight Procedure Date: 08/20/2019 10:17 AM MRN: [id number] Endoscopist: Milus Banister , MD Age: 53 Referring MD:  Date of Birth: September 14, 1966 Gender: Female Account #: [id number] Procedure:                Colonoscopy Indications:              Screening for colorectal malignant neoplasm (remote                            colonoscopy elsewhere, polyps found, unclear path                            results; was heme + 3 years ago) Medicines:                Monitored Anesthesia Care Procedure:                Pre-Anesthesia Assessment:                           - Prior to the procedure, a History and Physical                            was performed, and patient medications and                            allergies were reviewed. The patient's tolerance of                            previous anesthesia was also reviewed. The risks                            and benefits of the procedure and the sedation                            options and risks were discussed with the patient.                            All questions were answered, and informed consent                            was obtained. Prior Anticoagulants: The patient has                            taken no previous anticoagulant or antiplatelet                            agents. ASA Grade Assessment: II - A patient with                            mild systemic disease. After reviewing the risks                            and benefits, the patient was deemed in  satisfactory condition to undergo the procedure.                           After obtaining informed consent, the colonoscope                            was passed under direct vision. Throughout the                            procedure, the patient's blood pressure, pulse, and                            oxygen saturations were monitored continuously. The                            Colonoscope was  introduced through the anus and                            advanced to the the cecum, identified by                            appendiceal orifice and ileocecal valve. The                            colonoscopy was performed without difficulty. The                            patient tolerated the procedure well. The quality                            of the bowel preparation was good. The ileocecal                            valve, appendiceal orifice, and rectum were                            photographed. Scope In: 10:22:03 AM Scope Out: 10:33:36 AM Scope Withdrawal Time: 0 hours 8 minutes 18 seconds  Total Procedure Duration: 0 hours 11 minutes 33 seconds  Findings:                 Two sessile polyps were found in the ascending                            colon. The polyps were 2 to 4 mm in size. These                            polyps were removed with a cold snare. Resection                            and retrieval were complete.                           Multiple small and large-mouthed diverticula were  found in the left colon.                           External and internal hemorrhoids were found. The                            hemorrhoids were medium-sized.                           The exam was otherwise without abnormality on                            direct and retroflexion views. Complications:            No immediate complications. Estimated blood loss:                            None. Estimated Blood Loss:     Estimated blood loss: none. Impression:               - Two 2 to 4 mm polyps in the ascending colon,                            removed with a cold snare. Resected and retrieved.                           - Diverticulosis in the left colon.                           - External and internal hemorrhoids.                           - The examination was otherwise normal on direct                            and retroflexion  views. Recommendation:           - Patient has a contact number available for                            emergencies. The signs and symptoms of potential                            delayed complications were discussed with the                            patient. Return to normal activities tomorrow.                            Written discharge instructions were provided to the                            patient.                           - Resume previous diet.                           -  Continue present medications.                           - Await pathology results.                           - Consider referral for in office hemorrhoidal                            banding (medium sized internal and external                            hemorrhoids) Milus Banister, MD 08/20/2019 10:37:35 AM This report has been signed electronically.

## 2019-08-20 NOTE — Progress Notes (Signed)
Called to room to assist during endoscopic procedure.  Patient ID and intended procedure confirmed with present staff. Received instructions for my participation in the procedure from the performing physician.  

## 2019-08-20 NOTE — Progress Notes (Signed)
Temp by LC and vitals by [place]

## 2019-08-20 NOTE — Progress Notes (Signed)
Report to PACU, RN, vss, BBS= Clear.  

## 2019-08-20 NOTE — Patient Instructions (Addendum)
Handouts Provided:  Hemorrhoids and Banding  YOU HAD AN ENDOSCOPIC PROCEDURE TODAY AT [facility]:   Refer to the procedure report that was given to you for any specific questions about what was found during the examination.  If the procedure report does not answer your questions, please call your gastroenterologist to clarify.  If you requested that your care partner not be given the details of your procedure findings, then the procedure report has been included in a sealed envelope for you to review at your convenience later.  YOU SHOULD EXPECT: Some feelings of bloating in the abdomen. Passage of more gas than usual.  Walking can help get rid of the air that was put into your GI tract during the procedure and reduce the bloating. If you had a lower endoscopy (such as a colonoscopy or flexible sigmoidoscopy) you may notice spotting of blood in your stool or on the toilet paper. If you underwent a bowel prep for your procedure, you may not have a normal bowel movement for a few days.  Please Note:  You might notice some irritation and congestion in your nose or some drainage.  This is from the oxygen used during your procedure.  There is no need for concern and it should clear up in a day or so.  SYMPTOMS TO REPORT IMMEDIATELY:   Following lower endoscopy (colonoscopy or flexible sigmoidoscopy):  Excessive amounts of blood in the stool  Significant tenderness or worsening of abdominal pains  Swelling of the abdomen that is new, acute  Fever of 100F or higher  For urgent or emergent issues, a gastroenterologist can be reached at any hour by calling [phone].   DIET:  We do recommend a small meal at first, but then you may proceed to your regular diet.  Drink plenty of fluids but you should avoid alcoholic beverages for 24 hours.  ACTIVITY:  You should plan to take it easy for the rest of today and you should NOT DRIVE or use heavy machinery until tomorrow (because of the  sedation medicines used during the test).    FOLLOW UP: Our staff will call the number listed on your records 48-72 hours following your procedure to check on you and address any questions or concerns that you may have regarding the information given to you following your procedure. If we do not reach you, we will leave a message.  We will attempt to reach you two times.  During this call, we will ask if you have developed any symptoms of COVID 19. If you develop any symptoms (ie: fever, flu-like symptoms, shortness of breath, cough etc.) before then, please call [phone].  If you test positive for Covid 19 in the 2 weeks post procedure, please call and report this information to [place].    If any biopsies were taken you will be contacted by phone or by letter within the next 1-3 weeks.  Please call us at [phone] if you have not heard about the biopsies in 3 weeks.    SIGNATURES/CONFIDENTIALITY: You and/or your care partner have signed paperwork which will be entered into your electronic medical record.  These signatures attest to the fact that that the information above on your After Visit Summary has been reviewed and is understood.  Full responsibility of the confidentiality of this discharge information lies with you and/or your care-partner.

## 2019-08-24 ENCOUNTER — Encounter: Payer: Self-pay | Admitting: Gastroenterology

## 2019-08-24 ENCOUNTER — Telehealth: Payer: Self-pay

## 2019-08-24 NOTE — Telephone Encounter (Signed)
  Follow up Call-  Call back number 08/20/2019  Post procedure Call Back phone  # [phone]  Permission to leave phone message Yes  Some recent data might be hidden     Patient questions:  Do you have a fever, pain , or abdominal swelling? No. Pain Score  0 *  Have you tolerated food without any problems? Yes.    Have you been able to return to your normal activities? Yes.    Do you have any questions about your discharge instructions: Diet   No. Medications  No. Follow up visit  No.  Do you have questions or concerns about your Care? No.  Actions: * If pain score is 4 or above: No action needed, pain <4. 1. Have you developed a fever since your procedure? no  2.   Have you had an respiratory symptoms (SOB or cough) since your procedure? no  3.   Have you tested positive for COVID 19 since your procedure no  4.   Have you had any family members/close contacts diagnosed with the COVID 19 since your procedure?  no   If yes to any of these questions please route to Joylene John, RN and Alphonsa Gin, Therapist, sports.

## 2020-01-11 ENCOUNTER — Encounter (HOSPITAL_COMMUNITY): Payer: Self-pay

## 2020-01-11 ENCOUNTER — Emergency Department (HOSPITAL_COMMUNITY)
Admission: EM | Admit: 2020-01-11 | Discharge: 2020-01-12 | Disposition: A | Discharge status: Other Acute Inpatient Hospital | Attending: Emergency Medicine | Admitting: Emergency Medicine

## 2020-01-11 ENCOUNTER — Other Ambulatory Visit: Payer: Self-pay

## 2020-01-11 ENCOUNTER — Emergency Department (HOSPITAL_COMMUNITY)

## 2020-01-11 DIAGNOSIS — F10229 Alcohol dependence with intoxication, unspecified: Secondary | ICD-10-CM | POA: Insufficient documentation

## 2020-01-11 DIAGNOSIS — F4310 Post-traumatic stress disorder, unspecified: Secondary | ICD-10-CM | POA: Diagnosis not present

## 2020-01-11 DIAGNOSIS — F332 Major depressive disorder, recurrent severe without psychotic features: Secondary | ICD-10-CM | POA: Insufficient documentation

## 2020-01-11 DIAGNOSIS — Z79899 Other long term (current) drug therapy: Secondary | ICD-10-CM | POA: Insufficient documentation

## 2020-01-11 DIAGNOSIS — R0789 Other chest pain: Secondary | ICD-10-CM | POA: Diagnosis present

## 2020-01-11 DIAGNOSIS — I10 Essential (primary) hypertension: Secondary | ICD-10-CM | POA: Diagnosis not present

## 2020-01-11 DIAGNOSIS — R112 Nausea with vomiting, unspecified: Secondary | ICD-10-CM | POA: Diagnosis not present

## 2020-01-11 DIAGNOSIS — Z87891 Personal history of nicotine dependence: Secondary | ICD-10-CM | POA: Diagnosis not present

## 2020-01-11 DIAGNOSIS — Z20822 Contact with and (suspected) exposure to covid-19: Secondary | ICD-10-CM | POA: Diagnosis not present

## 2020-01-11 DIAGNOSIS — R45851 Suicidal ideations: Secondary | ICD-10-CM | POA: Diagnosis not present

## 2020-01-11 DIAGNOSIS — Y901 Blood alcohol level of 20-39 mg/100 ml: Secondary | ICD-10-CM | POA: Diagnosis not present

## 2020-01-11 DIAGNOSIS — R079 Chest pain, unspecified: Secondary | ICD-10-CM

## 2020-01-11 HISTORY — DX: Suicide attempt, initial encounter: T14.91XA

## 2020-01-11 LAB — SALICYLATE LEVEL: Salicylate Lvl: <7 mg/dL — ABNORMAL LOW (ref 7.0–30.0)

## 2020-01-11 LAB — RAPID URINE DRUG SCREEN, HOSP PERFORMED
Amphetamines: NOT DETECTED
Barbiturates: NOT DETECTED
Benzodiazepines: NOT DETECTED
Cocaine: POSITIVE — AB
Opiates: NOT DETECTED
Tetrahydrocannabinol: NOT DETECTED

## 2020-01-11 LAB — BASIC METABOLIC PANEL
Anion gap: 13 (ref 5–15)
BUN: 7 mg/dL (ref 6–20)
CO2: 21 mmol/L — ABNORMAL LOW (ref 22–32)
Calcium: 9.7 mg/dL (ref 8.9–10.3)
Chloride: 102 mmol/L (ref 98–111)
Creatinine, Ser: 0.87 mg/dL (ref 0.44–1.00)
GFR calc Af Amer: >60 mL/min (ref >60)
GFR calc non Af Amer: >60 mL/min (ref >60)
Glucose, Bld: 99 mg/dL (ref 70–99)
Potassium: 3.6 mmol/L (ref 3.5–5.1)
Sodium: 136 mmol/L (ref 135–145)

## 2020-01-11 LAB — CBC
HCT: 37.2 % (ref 36.0–46.0)
Hemoglobin: 11.9 g/dL — ABNORMAL LOW (ref 12.0–15.0)
MCH: 26.2 pg (ref 26.0–34.0)
MCHC: 32 g/dL (ref 30.0–36.0)
MCV: 81.9 fL (ref 80.0–100.0)
Platelets: 316 10*3/uL (ref 150–400)
RBC: 4.54 MIL/uL (ref 3.87–5.11)
RDW: 16.2 % — ABNORMAL HIGH (ref 11.5–15.5)
WBC: 14.7 10*3/uL — ABNORMAL HIGH (ref 4.0–10.5)
nRBC: 0 % (ref 0.0–0.2)

## 2020-01-11 LAB — TROPONIN I (HIGH SENSITIVITY)
Troponin I (High Sensitivity): 9 ng/L (ref <18)
Troponin I (High Sensitivity): 9 ng/L (ref <18)

## 2020-01-11 LAB — I-STAT BETA HCG BLOOD, ED (MC, WL, AP ONLY): I-stat hCG, quantitative: <5 m[IU]/mL (ref <5)

## 2020-01-11 LAB — RESPIRATORY PANEL BY RT PCR (FLU A&B, COVID)
Influenza A by PCR: NEGATIVE
Influenza B by PCR: NEGATIVE
SARS Coronavirus 2 by RT PCR: NEGATIVE

## 2020-01-11 LAB — ACETAMINOPHEN LEVEL: Acetaminophen (Tylenol), Serum: <10 ug/mL — ABNORMAL LOW (ref 10–30)

## 2020-01-11 LAB — ETHANOL: Alcohol, Ethyl (B): 33 mg/dL — ABNORMAL HIGH (ref <10)

## 2020-01-11 LAB — D-DIMER, QUANTITATIVE: D-Dimer, Quant: 0.43 ug/mL-FEU (ref 0.00–0.50)

## 2020-01-11 MED ORDER — NITROGLYCERIN 0.4 MG SL SUBL: 0.4000 mg | SUBLINGUAL_TABLET | SUBLINGUAL | Status: DC | PRN

## 2020-01-11 MED ORDER — ALBUTEROL SULFATE HFA 108 (90 BASE) MCG/ACT IN AERS: 2.0000 | INHALATION_SPRAY | Freq: Four times a day (QID) | RESPIRATORY_TRACT | Status: DC | PRN

## 2020-01-11 MED ORDER — PANTOPRAZOLE SODIUM 40 MG PO TBEC
40.0000 mg | DELAYED_RELEASE_TABLET | Freq: Every day | ORAL | Status: DC
  Administered 2020-01-11: 40 mg via ORAL
  Filled 2020-01-11: qty 1

## 2020-01-11 MED ORDER — FLUOXETINE HCL 20 MG PO CAPS
20.0000 mg | ORAL_CAPSULE | Freq: Every day | ORAL | Status: DC
  Administered 2020-01-11: 20 mg via ORAL
  Filled 2020-01-11 (×2): qty 1

## 2020-01-11 MED ORDER — HYDROXYZINE HCL 25 MG PO TABS
25.0000 mg | ORAL_TABLET | Freq: Every day | ORAL | Status: DC
  Administered 2020-01-11: 25 mg via ORAL
  Filled 2020-01-11: qty 2

## 2020-01-11 NOTE — ED Notes (Signed)
C/o R side chest pain, localized under R breast with SOB. 2 days hx of R flank pain and "swelling" to the area.  No nausea, vomiting.  Chronic hx of blood in stool, c/o diarrhea with blood noted.  Pt grabbing a R breast with distress.  Does admit to feeling a little suicidal.

## 2020-01-11 NOTE — Progress Notes (Signed)
Pt accepted to [facility] Adult Unit; 306-02.     Mordecai Maes, NP is the accepting provider.    Dr. Parke Poisson is the attending provider.    Call report to [phone].     [place] @ [facility] ED notified.     Pt is Voluntary.    Pt may be transported by [facility].   Pt scheduled to arrive at [facility], unit to coordinate arrival time.   Domenic Schwab, MSW, LCSW-A Clinical Disposition Social Worker [facility]/TTS [phone]

## 2020-01-11 NOTE — ED Notes (Signed)
Pt aware of tx plan - Meets Inpt Criteria - voiced understanding and agreement.

## 2020-01-11 NOTE — ED Notes (Signed)
TTS completed and report given to purple.  Belongings with pt to [facility].

## 2020-01-11 NOTE — [facility] Counselor (Signed)
Disposition: Mordecai Maes, NP recommends in patient treatment. Caitlyn, CSW checking to see if beds available at [facility]. If not TTS to seek placement.

## 2020-01-11 NOTE — ED Provider Notes (Signed)
[place] EMERGENCY DEPARTMENT Provider Note   CSN: [id number] Arrival date & time: 01/11/20  1130     History Chief Complaint  Patient presents with  . Chest Pain  . Suicidal    Denise Knight is a 54 y.o. female with PMH significant for cocaine induced coronary vasospasm, HTN, asthma, PUD, GERD, substance use disorder, and type I bipolar disorder who presents to the ED for evaluation of left-sided chest pain that began at 9 AM.  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  She also developed nausea and had 2 episodes of nonbloody emesis.  She is endorsing tingling sensation of her distal third finger left hand, however she reports that this is chronic in nature and attributable to her diffuse canal stenosis and cervical spine.  She is also endorsing a cough, lightheadedness, and pleuritic chest pain.  Yesterday she reports that she was having significant suicidal ideation, with plan to overdose using prescription medication.  However, her desire to harm herself is less pronounced today than it was yesterday.  She drank "a few beers" last night and smokes tobacco regularly, however denies any other illicit drug use.  HPI     Past Medical History:  Diagnosis Date  . Allergy    seasonal  . Anxiety   . Arthritis    "left knee" (07/05/2016)  . Asthma   . Bipolar 1 disorder ([place])   . Depression   . GERD (gastroesophageal reflux disease)   . Hypertension   . MI (mitral incompetence)   . Post traumatic stress disorder   . Schizophrenia ([place])   . Stomach ulcer   . Substance abuse [facility])    cocaine quit july 2020    Patient Active Problem List   Diagnosis Date Noted  . MDD (major depressive disorder), recurrent, severe, with psychosis ([place]) 06/01/2019  . Cocaine abuse ([place]) 12/27/2017  . Rectal bleeding 12/27/2017  . NSTEMI (non-ST elevated myocardial  infarction) ([place]) 12/23/2017  . Acute viral bronchitis 06/14/2017  . Asthma exacerbation 06/14/2017  . Chest pain 07/05/2016    Past Surgical History:  Procedure Laterality Date  . CARDIAC CATHETERIZATION  07/05/2016  . CARDIAC CATHETERIZATION N/A 07/05/2016   Procedure: Left Heart Cath and Coronary Angiography;  Surgeon: Adrian Prows, MD;  Location: [place] CV LAB;  Service: Cardiovascular;  Laterality: N/A;  . CYST EXCISION Right    "wrist"  . DILATION AND CURETTAGE OF UTERUS    . FOOT SURGERY Bilateral    "corns removed"  . LEFT HEART CATH AND CORONARY ANGIOGRAPHY N/A 12/24/2017   Procedure: LEFT HEART CATH AND CORONARY ANGIOGRAPHY;  Surgeon: Nigel Mormon, MD;  Location: [place] CV LAB;  Service: Cardiovascular;  Laterality: N/A;  . TUBAL LIGATION       OB History   No obstetric history on file.     Family History  Problem Relation Age of Onset  . Breast cancer Other   . Lung cancer Other   . Congestive Heart Failure Other   . Diabetes Other   . Hypertension Other   . Colon cancer Neg Hx   . Colon polyps Neg Hx   . Esophageal cancer Neg Hx   . Stomach cancer Neg Hx   . Rectal cancer Neg Hx     Social History   Tobacco Use  . Smoking status: Former Smoker    Packs/day: 0.50    Years:  35.00    Pack years: 17.50    Types: Cigarettes    Quit date: 05/30/2019    Years since quitting: 0.6  . Smokeless tobacco: Never Used  Substance Use Topics  . Alcohol use: Not Currently    Alcohol/week: 7.0 standard drinks    Types: 7 Cans of beer per week    Comment: socially,stopped july 2020  . Drug use: Yes    Types: Cocaine    Comment: 2 days,july 2020 last used,08/06/19    Home Medications Prior to Admission medications   Medication Sig Start Date End Date Taking? Authorizing Provider  albuterol (PROVENTIL HFA;VENTOLIN HFA) 108 (90 BASE) MCG/ACT inhaler Inhale 2 puffs into the lungs every 6 (six) hours as needed for wheezing or shortness of breath.   Yes  [provider]  amantadine (SYMMETREL) 100 MG capsule Take 1 capsule (100 mg total) by mouth 2 (two) times daily. 06/03/19  Yes Johnn Hai, MD  FLUoxetine (PROZAC) 20 MG capsule Take 1 capsule (20 mg total) by mouth daily. 06/04/19  Yes Johnn Hai, MD  hydrOXYzine (ATARAX/VISTARIL) 25 MG tablet Take 25-50 mg by mouth at bedtime. 09/23/19  Yes [provider]  metroNIDAZOLE (FLAGYL) 500 MG tablet Take 500 mg by mouth 2 (two) times daily. On day 6 of 7-day course 01/06/20 01/13/20 Yes [provider]  nitroGLYCERIN (NITROSTAT) 0.4 MG SL tablet Place 1 tablet (0.4 mg total) under the tongue every 5 (five) minutes x 3 doses as needed for chest pain. 02/11/18  Yes Law, Bea Graff, PA-C  omeprazole (PRILOSEC) 20 MG capsule Take 20 mg by mouth daily.   Yes [provider]  dicyclomine (BENTYL) 20 MG tablet Take 1 tablet (20 mg total) by mouth 2 (two) times daily as needed for spasms. Patient not taking: Reported on 01/11/2020 07/21/19   Betsey Amen, MD  predniSONE (STERAPRED UNI-PAK 21 TAB) 10 MG (21) TBPK tablet Take by mouth daily. Take 6 tabs by mouth daily  for 2 days, then 5 tabs for 2 days, taper 1 tab every 2 days Patient not taking: Reported on 01/11/2020 08/17/19   Hayden Rasmussen, MD  traZODone (DESYREL) 50 MG tablet Take 50 mg by mouth at bedtime. 12/14/19   [provider]    Allergies    Aspirin, Food, and Morphine and related  Review of Systems   Review of Systems  All other systems reviewed and are negative.    Physical Exam Updated Vital Signs BP (!) 123/94   Pulse 100   Temp 98.4 F (36.9 C) (Oral)   Resp (!) 23   Ht 5' 8.5" (1.74 m)   Wt 81.6 kg   SpO2 97%   BMI 26.97 kg/m   Physical Exam Vitals and nursing note reviewed. Exam conducted with a chaperone present.  HENT:     Head: Normocephalic and atraumatic.  Eyes:     General: No scleral icterus.    Extraocular Movements: Extraocular movements intact.      Conjunctiva/sclera: Conjunctivae normal.     Pupils: Pupils are equal, round, and reactive to light.  Cardiovascular:     Rate and Rhythm: Regular rhythm. Tachycardia present.     Pulses: Normal pulses.     Heart sounds: Normal heart sounds.  Pulmonary:     Effort: Pulmonary effort is normal. No respiratory distress.     Breath sounds: Normal breath sounds.  Abdominal:     General: Abdomen is flat. There is no distension.  Palpations: Abdomen is soft.     Tenderness: There is no abdominal tenderness. There is no guarding.  Musculoskeletal:        General: No swelling. Normal range of motion.     Cervical back: Normal range of motion and neck supple. No rigidity.     Right lower leg: No edema.     Left lower leg: No edema.  Skin:    General: Skin is dry.     Capillary Refill: Capillary refill takes less than 2 seconds.  Neurological:     Mental Status: She is alert and oriented to person, place, and time.     GCS: GCS eye subscore is 4. GCS verbal subscore is 5. GCS motor subscore is 6.  Psychiatric:        Mood and Affect: Mood normal.        Behavior: Behavior normal.        Thought Content: Thought content normal.     Comments: Teary-eyed.     ED Results / Procedures / Treatments   Labs (all labs ordered are listed, but only abnormal results are displayed) Labs Reviewed  BASIC METABOLIC PANEL - Abnormal; Notable for the following components:      Result Value   CO2 21 (*)    All other components within normal limits  CBC - Abnormal; Notable for the following components:   WBC 14.7 (*)    Hemoglobin 11.9 (*)    RDW 16.2 (*)    All other components within normal limits  ETHANOL - Abnormal; Notable for the following components:   Alcohol, Ethyl (B) 33 (*)    All other components within normal limits  SALICYLATE LEVEL - Abnormal; Notable for the following components:   Salicylate Lvl <7.9 (*)    All other components within normal limits  ACETAMINOPHEN LEVEL -  Abnormal; Notable for the following components:   Acetaminophen (Tylenol), Serum <10 (*)    All other components within normal limits  RAPID URINE DRUG SCREEN, HOSP PERFORMED - Abnormal; Notable for the following components:   Cocaine POSITIVE (*)    All other components within normal limits  RESPIRATORY PANEL BY RT PCR (FLU A&B, COVID)  D-DIMER, QUANTITATIVE (NOT AT [facility])  I-STAT BETA HCG BLOOD, ED ([facility], [facility], [facility] ONLY)  I-STAT BETA HCG BLOOD, ED ([facility], [facility], [facility] ONLY)  TROPONIN I (HIGH SENSITIVITY)  TROPONIN I (HIGH SENSITIVITY)    EKG EKG Interpretation  Date/Time:  Tuesday January 11 2020 11:34:28 EST Ventricular Rate:  113 PR Interval:  114 QRS Duration: 64 QT Interval:  338 QTC Calculation: 463 R Axis:   38 Text Interpretation: Sinus tachycardia Cannot rule out Inferior infarct , age undetermined Abnormal ECG Confirmed by Virgel Manifold [phone]) on 01/11/2020 11:49:42 AM   Radiology DG Chest 2 View  Result Date: 01/11/2020 CLINICAL DATA:  Chest pain. EXAM: CHEST - 2 VIEW COMPARISON:  June 01, 2019. FINDINGS: The heart size and mediastinal contours are within normal limits. Both lungs are clear. No pneumothorax or pleural effusion is noted. The visualized skeletal structures are unremarkable. IMPRESSION: No active cardiopulmonary disease. Electronically Signed   By: Marijo Conception M.D.   On: 01/11/2020 14:16    Procedures Procedures (including critical care time)  Medications Ordered in ED Medications - No data to display  ED Course  I have reviewed the triage vital signs and the nursing notes.  Pertinent labs & imaging results that were available during my care of the patient were reviewed by  me and considered in my medical decision making (see chart for details).    MDM Rules/Calculators/A&P                      Patient's laboratory work-up is significant for leukocytosis to 14.7, alcohol of 33, and positive cocaine on rapid UDS.  I reviewed patient's plain films obtained of  chest which demonstrated no acute cardiopulmonary findings.  EKG demonstrates sinus tachycardia and cannot rule out inferior infarct.  However, her initial troponin is 9.  Will trend.  Obtained D-dimer given patient's reported pleuritic chest pain in conjunction with her elevated heart rate and tachypnea.  D-dimer was normal at 0.43.  No unilateral leg swelling concerning for DVT and I have low suspicion for pulmonary embolism.  Patient is denying any and all urinary symptoms as well as any flank or suprapubic TTP.  Given patient's reassuring work-up, will consult TTS given her reported SI with plan for medication OD in conjunction with alcohol use and cocaine.  Otherwise, she is medically clear at this time.  TTS to determine disposition.  Dr. Wilson Singer evaluated patient and agrees with assessment and plan.   Oncoming provider, Wyn Quaker, PA-C made aware of case.   Final Clinical Impression(s) / ED Diagnoses Final diagnoses:  Nonspecific chest pain  Suicidal ideation    Rx / DC Orders ED Discharge Orders    None       Corena Herter, PA-C 01/11/20 1548    Virgel Manifold, MD 01/12/20 [phone]

## 2020-01-11 NOTE — ED Triage Notes (Signed)
Pt arrives POV for eval of L sided CP onset 0900 this AM, w/ intermittent, isolated L hand tingling since that time. Pt is neuro intact in triage. Tearful and anxious in triage. Pt also endorsing SI, initially stated "I don't want to answer" however does endorse SI w/ plan to OD on pills to this RN. Pt w/ no facial droop, no unilateral weakness, no drift. Neuro intact in triage.

## 2020-01-11 NOTE — ED Notes (Signed)
ALL belongings - 1 labeled belongings bag and 1 purse - Locker #2 - Valuables Envelope and Home Meds inventoried - placed on clipboard - Pt may possibly be accepted to [facility] this evening.

## 2020-01-11 NOTE — ED Notes (Signed)
1 ring, 2 necklaces and 1 watch removed and placed in labeled collection container.
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
## 2020-01-11 NOTE — ED Notes (Addendum)
Pt arrived to Rm 51 - via stretcher - pt ambulatory to bed. Pt noted to be wearing burgundy scrubs. ALL Belongings - 3 labeled belongings bags - placed at nurses' desk for inventory. Pt noted to be alert, oriented. Pt eating snack given. Voiced understanding of Medical Clearance Pt Policy form. COVID-19 test performed - states she gets these every Monday d/t works at [employer]	[place]. Pt given remote control and allowed to obtain numbers from cell phone. Pt noted w/congestion and non-prod cough.

## 2020-01-12 ENCOUNTER — Encounter (HOSPITAL_COMMUNITY): Payer: Self-pay | Admitting: Behavioral Health

## 2020-01-12 ENCOUNTER — Inpatient Hospital Stay (HOSPITAL_COMMUNITY)
Admission: AD | Admit: 2020-01-12 | Discharge: 2020-01-18 | DRG: 885 | Disposition: A | Discharge status: Rehabilitation Facility | Source: Intra-hospital | Attending: Psychiatry | Admitting: Psychiatry

## 2020-01-12 DIAGNOSIS — F1410 Cocaine abuse, uncomplicated: Secondary | ICD-10-CM | POA: Diagnosis present

## 2020-01-12 DIAGNOSIS — R45851 Suicidal ideations: Secondary | ICD-10-CM | POA: Diagnosis present

## 2020-01-12 DIAGNOSIS — Z79899 Other long term (current) drug therapy: Secondary | ICD-10-CM

## 2020-01-12 DIAGNOSIS — F4310 Post-traumatic stress disorder, unspecified: Secondary | ICD-10-CM | POA: Diagnosis present

## 2020-01-12 DIAGNOSIS — F332 Major depressive disorder, recurrent severe without psychotic features: Secondary | ICD-10-CM | POA: Diagnosis not present

## 2020-01-12 DIAGNOSIS — Y901 Blood alcohol level of 20-39 mg/100 ml: Secondary | ICD-10-CM | POA: Diagnosis present

## 2020-01-12 DIAGNOSIS — F17210 Nicotine dependence, cigarettes, uncomplicated: Secondary | ICD-10-CM | POA: Diagnosis present

## 2020-01-12 DIAGNOSIS — J45909 Unspecified asthma, uncomplicated: Secondary | ICD-10-CM | POA: Diagnosis present

## 2020-01-12 DIAGNOSIS — Z885 Allergy status to narcotic agent status: Secondary | ICD-10-CM

## 2020-01-12 DIAGNOSIS — Z91410 Personal history of adult physical and sexual abuse: Secondary | ICD-10-CM

## 2020-01-12 DIAGNOSIS — Z915 Personal history of self-harm: Secondary | ICD-10-CM

## 2020-01-12 DIAGNOSIS — G4700 Insomnia, unspecified: Secondary | ICD-10-CM | POA: Diagnosis present

## 2020-01-12 DIAGNOSIS — Z91018 Allergy to other foods: Secondary | ICD-10-CM | POA: Diagnosis not present

## 2020-01-12 DIAGNOSIS — K219 Gastro-esophageal reflux disease without esophagitis: Secondary | ICD-10-CM | POA: Diagnosis present

## 2020-01-12 DIAGNOSIS — Z886 Allergy status to analgesic agent status: Secondary | ICD-10-CM | POA: Diagnosis not present

## 2020-01-12 DIAGNOSIS — F1020 Alcohol dependence, uncomplicated: Secondary | ICD-10-CM | POA: Diagnosis present

## 2020-01-12 DIAGNOSIS — F329 Major depressive disorder, single episode, unspecified: Secondary | ICD-10-CM | POA: Insufficient documentation

## 2020-01-12 DIAGNOSIS — I10 Essential (primary) hypertension: Secondary | ICD-10-CM | POA: Diagnosis present

## 2020-01-12 MED ORDER — NICOTINE 7 MG/24HR TD PT24
7.0000 mg | MEDICATED_PATCH | Freq: Every day | TRANSDERMAL | Status: DC
  Administered 2020-01-12 – 2020-01-18 (×7): 7 mg via TRANSDERMAL
  Filled 2020-01-12 (×7): qty 1
  Filled 2020-01-12: qty 14
  Filled 2020-01-12: qty 1

## 2020-01-12 MED ORDER — HYDROXYZINE HCL 25 MG PO TABS
25.0000 mg | ORAL_TABLET | Freq: Four times a day (QID) | ORAL | Status: AC | PRN
  Administered 2020-01-13 – 2020-01-15 (×2): 25 mg via ORAL
  Filled 2020-01-12 (×3): qty 1

## 2020-01-12 MED ORDER — MAGNESIUM HYDROXIDE 400 MG/5ML PO SUSP: 30.0000 mL | Freq: Every day | ORAL | Status: DC | PRN

## 2020-01-12 MED ORDER — ADULT MULTIVITAMIN W/MINERALS CH
1.0000 | ORAL_TABLET | Freq: Every day | ORAL | Status: DC
  Administered 2020-01-12 – 2020-01-18 (×7): 1 via ORAL
  Filled 2020-01-12 (×10): qty 1

## 2020-01-12 MED ORDER — THIAMINE HCL 100 MG PO TABS
100.0000 mg | ORAL_TABLET | Freq: Every day | ORAL | Status: DC
  Administered 2020-01-13 – 2020-01-18 (×6): 100 mg via ORAL
  Filled 2020-01-12 (×8): qty 1

## 2020-01-12 MED ORDER — ONDANSETRON 4 MG PO TBDP: 4.0000 mg | ORAL_TABLET | Freq: Four times a day (QID) | ORAL | Status: DC | PRN

## 2020-01-12 MED ORDER — ACETAMINOPHEN 325 MG PO TABS
650.0000 mg | ORAL_TABLET | Freq: Four times a day (QID) | ORAL | Status: DC | PRN
  Administered 2020-01-17: 650 mg via ORAL
  Filled 2020-01-12: qty 2

## 2020-01-12 MED ORDER — TRAZODONE HCL 50 MG PO TABS
50.0000 mg | ORAL_TABLET | Freq: Every evening | ORAL | Status: DC | PRN
  Administered 2020-01-12: 50 mg via ORAL
  Filled 2020-01-12: qty 1

## 2020-01-12 MED ORDER — METRONIDAZOLE 500 MG PO TABS
500.0000 mg | ORAL_TABLET | Freq: Two times a day (BID) | ORAL | Status: AC
  Administered 2020-01-12: 500 mg via ORAL
  Filled 2020-01-12 (×2): qty 1

## 2020-01-12 MED ORDER — TRAZODONE HCL 50 MG PO TABS
50.0000 mg | ORAL_TABLET | Freq: Every evening | ORAL | Status: DC | PRN
  Administered 2020-01-13: 50 mg via ORAL
  Filled 2020-01-12 (×2): qty 1

## 2020-01-12 MED ORDER — THIAMINE HCL 100 MG/ML IJ SOLN
100.0000 mg | Freq: Once | INTRAMUSCULAR | Status: AC
  Administered 2020-01-12: 100 mg via INTRAMUSCULAR
  Filled 2020-01-12: qty 2

## 2020-01-12 MED ORDER — AMANTADINE HCL 100 MG PO CAPS
100.0000 mg | ORAL_CAPSULE | Freq: Two times a day (BID) | ORAL | Status: DC
  Administered 2020-01-12 – 2020-01-18 (×11): 100 mg via ORAL
  Filled 2020-01-12 (×7): qty 1
  Filled 2020-01-12: qty 28
  Filled 2020-01-12 (×7): qty 1
  Filled 2020-01-12: qty 28

## 2020-01-12 MED ORDER — ALUM & MAG HYDROXIDE-SIMETH 200-200-20 MG/5ML PO SUSP: 30.0000 mL | ORAL | Status: DC | PRN

## 2020-01-12 MED ORDER — LOPERAMIDE HCL 2 MG PO CAPS: 2.0000 mg | ORAL_CAPSULE | ORAL | Status: DC | PRN

## 2020-01-12 MED ORDER — LORAZEPAM 1 MG PO TABS: 1.0000 mg | ORAL_TABLET | Freq: Four times a day (QID) | ORAL | Status: AC | PRN

## 2020-01-12 MED ORDER — HYDROXYZINE HCL 25 MG PO TABS
25.0000 mg | ORAL_TABLET | Freq: Three times a day (TID) | ORAL | Status: DC | PRN
  Administered 2020-01-12: 25 mg via ORAL
  Filled 2020-01-12: qty 1

## 2020-01-12 MED ORDER — SERTRALINE HCL 50 MG PO TABS
50.0000 mg | ORAL_TABLET | Freq: Every day | ORAL | Status: DC
  Administered 2020-01-12 – 2020-01-16 (×5): 50 mg via ORAL
  Filled 2020-01-12 (×9): qty 1

## 2020-01-12 NOTE — Progress Notes (Signed)
Psychoeducational Group Note  Date:  01/12/2020 Time:  2030  Group Topic/Focus:  wrap up group  Participation Level: Did Not Attend  Participation Quality:  Not Applicable  Affect:  Not Applicable  Cognitive:  Not Applicable  Insight:  Not Applicable  Engagement in Group: Not Applicable  Additional Comments:  Pt remained in bed during group time.   Shellia Cleverly 01/12/2020, 9:43 PM
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
## 2020-01-12 NOTE — [facility] Group Notes (Signed)
LCSW Group Therapy Note  01/12/2020 2:23 PM  Type of Therapy/Topic:  Group Therapy:  Balance in Life  Participation Level:  Did Not Attend  Description of Group:    This group will address the concept of balance and how it feels and looks when one is unbalanced. Patients will be encouraged to process areas in their lives that are out of balance and identify reasons for remaining unbalanced. Facilitators will guide patients in utilizing problem-solving interventions to address and correct the stressor making their life unbalanced. Understanding and applying boundaries will be explored and addressed for obtaining and maintaining a balanced life. Patients will be encouraged to explore ways to assertively make their unbalanced needs known to significant others in their lives, using other group members and facilitator for support and feedback.  Therapeutic Goals: 1. Patient will identify two or more emotions or situations they have that consume much of in their lives. 2. Patient will identify signs/triggers that life has become out of balance:  3. Patient will identify two ways to set boundaries in order to achieve balance in their lives:  4. Patient will demonstrate ability to communicate their needs through discussion and/or role plays  Summary of Patient Progress:  x    Therapeutic Modalities:   Cognitive Behavioral Therapy Solution-Focused Therapy Assertiveness Training  Evalina Field, MSW, LCSW Clinical Social Work 01/12/2020 2:23 PM

## 2020-01-12 NOTE — Progress Notes (Signed)
   01/12/20 2100  Psych Admission Type (Psych Patients Only)  Admission Status Voluntary  Psychosocial Assessment  Patient Complaints Depression  Eye Contact Fair  Facial Expression Anxious  Affect Depressed  Speech Logical/coherent  Interaction Assertive  Motor Activity Slow  Appearance/Hygiene Disheveled  Behavior Characteristics Guarded  Mood Depressed  Aggressive Behavior  Effect No apparent injury  Thought Process  Coherency WDL  Content WDL  Delusions WDL  Perception WDL  Hallucination Visual  Judgment Impaired  Confusion WDL  Danger to Self  Current suicidal ideation? Denies  Danger to Others  Danger to Others None reported or observed   Pt denies SI/ HI this evening and stated she was doing better today

## 2020-01-12 NOTE — Progress Notes (Signed)
D:  Patient denied SI and HI, contracts for safety.  Denied A/V hallucinations.   A:  Medications administered per MD orders.  Emotional support and encouragement given patient. R:  Safety maintained with 15 minute checks.  

## 2020-01-12 NOTE — [facility] Counselor (Signed)
MSW intern attempted (1st attempt) to complete assessment. Pt was asleep and did not wake up.   Ovidio Kin, MSW intern

## 2020-01-12 NOTE — H&P (Addendum)
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 was discharged on Amantadine, Amlodipine , Prozac . ( Amantadine likely prescribed to help address/decrease cocaine cravings/use)   Denies medical illnesses. Allergic to ASA ( hives) and Morphine ( hives)  Home medications- Albuterol Inhaler PRN, Amantadine 100 mgrs BID, Prozac 20 mgrs QDAY , Trazodone 50-100 mgrs QHS, She had also recently started on Metronidazole recently for GU infection , Omeprazole .  Dx- MDD versus Substance Induced Mood Disorder. PTSD by history. Cocaine Use Disorder .  Plan - inpatient admission. We reviewed medication management options. She reports she does not feel Prozac was helping much and is interested in trying another antidepressant.  Agrees to Zoloft trial, side effects reviewed. She does state she has been taking Amantadine regularly and feels it helps and is well tolerated, prefers to continue it.

## 2020-01-12 NOTE — Tx Team (Signed)
Initial Treatment Plan 01/12/2020 3:07 AM Dulcy Fanny LTG:[id number]    PATIENT STRESSORS: Marital or family conflict Medication change or noncompliance Substance abuse   PATIENT STRENGTHS: General fund of knowledge Motivation for treatment/growth Supportive family/friends   PATIENT IDENTIFIED PROBLEMS:  risk for SI  Psychosis  SA-cocaine  "nothing"               DISCHARGE CRITERIA:  Improved stabilization in mood, thinking, and/or behavior Verbal commitment to aftercare and medication compliance  PRELIMINARY DISCHARGE PLAN: Attend aftercare/continuing care group Attend PHP/IOP Attend 12-step recovery group Outpatient therapy  PATIENT/FAMILY INVOLVEMENT: This treatment plan has been presented to and reviewed with the patient, Denise Knight.  The patient and family have been given the opportunity to ask questions and make suggestions.  Providence Crosby, RN 01/12/2020, 3:07 AM

## 2020-01-12 NOTE — Tx Team (Signed)
Interdisciplinary Treatment and Diagnostic Plan Update  01/12/2020 Time of Session: 9:40am Nigel Wessman MRN: [id number]  Principal Diagnosis: Severe recurrent major depression without psychotic features [facility])  Secondary Diagnoses: Principal Problem:   Severe recurrent major depression without psychotic features ([place]) Active Problems:   Cocaine abuse ([place])   Alcohol use disorder, moderate, dependence ([place])   Current Medications:  Current Facility-Administered Medications  Medication Dose Route Frequency Provider Last Rate Last Admin  . acetaminophen (TYLENOL) tablet 650 mg  650 mg Oral Q6H PRN Anike, Adaku C, NP      . alum & mag hydroxide-simeth (MAALOX/MYLANTA) 200-200-20 MG/5ML suspension 30 mL  30 mL Oral Q4H PRN Anike, Adaku C, NP      . hydrOXYzine (ATARAX/VISTARIL) tablet 25 mg  25 mg Oral TID PRN Anike, Adaku C, NP   25 mg at 01/12/20 0134  . magnesium hydroxide (MILK OF MAGNESIA) suspension 30 mL  30 mL Oral Daily PRN Anike, Adaku C, NP      . metroNIDAZOLE (FLAGYL) tablet 500 mg  500 mg Oral Q12H Anike, Adaku C, NP   500 mg at 01/12/20 0857  . nicotine (NICODERM CQ - dosed in mg/24 hr) patch 7 mg  7 mg Transdermal Daily Anike, Adaku C, NP   7 mg at 01/12/20 0858  . traZODone (DESYREL) tablet 50 mg  50 mg Oral QHS PRN Anike, Adaku C, NP   50 mg at 01/12/20 0135   PTA Medications: Medications Prior to Admission  Medication Sig Dispense Refill Last Dose  . albuterol (PROVENTIL HFA;VENTOLIN HFA) 108 (90 BASE) MCG/ACT inhaler Inhale 2 puffs into the lungs every 6 (six) hours as needed for wheezing or shortness of breath.     Marland Kitchen amantadine (SYMMETREL) 100 MG capsule Take 1 capsule (100 mg total) by mouth 2 (two) times daily. 60 capsule 1   . dicyclomine (BENTYL) 20 MG tablet Take 1 tablet (20 mg total) by mouth 2 (two) times daily as needed for spasms. (Patient not taking: Reported on 01/11/2020) 20 tablet 0   . FLUoxetine (PROZAC) 20 MG capsule Take 1 capsule (20 mg total) by  mouth daily. 30 capsule 1   . hydrOXYzine (ATARAX/VISTARIL) 25 MG tablet Take 25-50 mg by mouth at bedtime.     . metroNIDAZOLE (FLAGYL) 500 MG tablet Take 500 mg by mouth 2 (two) times daily. On day 6 of 7-day course     . nitroGLYCERIN (NITROSTAT) 0.4 MG SL tablet Place 1 tablet (0.4 mg total) under the tongue every 5 (five) minutes x 3 doses as needed for chest pain. 25 tablet 1   . omeprazole (PRILOSEC) 20 MG capsule Take 20 mg by mouth daily.     . predniSONE (STERAPRED UNI-PAK 21 TAB) 10 MG (21) TBPK tablet Take by mouth daily. Take 6 tabs by mouth daily  for 2 days, then 5 tabs for 2 days, taper 1 tab every 2 days (Patient not taking: Reported on 01/11/2020) 42 tablet 0   . traZODone (DESYREL) 50 MG tablet Take 50 mg by mouth at bedtime.       Patient Stressors: Marital or family conflict Medication change or noncompliance Substance abuse  Patient Strengths: Technical sales engineer for treatment/growth Supportive family/friends  Treatment Modalities: Medication Management, Group therapy, Case management,  1 to 1 session with clinician, Psychoeducation, Recreational therapy.   Physician Treatment Plan for Primary Diagnosis: Severe recurrent major depression without psychotic features ([place]) Long Term Goal(s): Improvement in symptoms so as ready for discharge  Improvement in symptoms so as ready for discharge   Short Term Goals: Ability to identify changes in lifestyle to reduce recurrence of condition will improve Ability to verbalize feelings will improve Ability to disclose and discuss suicidal ideas Ability to demonstrate self-control will improve Ability to identify and develop effective coping behaviors will improve Ability to maintain clinical measurements within normal limits will improve Compliance with prescribed medications will improve Ability to identify triggers associated with substance abuse/mental health issues will improve Ability to identify changes  in lifestyle to reduce recurrence of condition will improve Ability to verbalize feelings will improve Ability to disclose and discuss suicidal ideas Ability to demonstrate self-control will improve Ability to identify and develop effective coping behaviors will improve Ability to maintain clinical measurements within normal limits will improve Compliance with prescribed medications will improve Ability to identify triggers associated with substance abuse/mental health issues will improve  Medication Management: Evaluate patient's response, side effects, and tolerance of medication regimen.  Therapeutic Interventions: 1 to 1 sessions, Unit Group sessions and Medication administration.  Evaluation of Outcomes: Not Met  Physician Treatment Plan for Secondary Diagnosis: Principal Problem:   Severe recurrent major depression without psychotic features ([place]) Active Problems:   Cocaine abuse ([facility])   Alcohol use disorder, moderate, dependence ([place])  Long Term Goal(s): Improvement in symptoms so as ready for discharge Improvement in symptoms so as ready for discharge   Short Term Goals: Ability to identify changes in lifestyle to reduce recurrence of condition will improve Ability to verbalize feelings will improve Ability to disclose and discuss suicidal ideas Ability to demonstrate self-control will improve Ability to identify and develop effective coping behaviors will improve Ability to maintain clinical measurements within normal limits will improve Compliance with prescribed medications will improve Ability to identify triggers associated with substance abuse/mental health issues will improve Ability to identify changes in lifestyle to reduce recurrence of condition will improve Ability to verbalize feelings will improve Ability to disclose and discuss suicidal ideas Ability to demonstrate self-control will improve Ability to identify and develop effective coping behaviors will  improve Ability to maintain clinical measurements within normal limits will improve Compliance with prescribed medications will improve Ability to identify triggers associated with substance abuse/mental health issues will improve     Medication Management: Evaluate patient's response, side effects, and tolerance of medication regimen.  Therapeutic Interventions: 1 to 1 sessions, Unit Group sessions and Medication administration.  Evaluation of Outcomes: Not Met   RN Treatment Plan for Primary Diagnosis: Severe recurrent major depression without psychotic features ([place]) Long Term Goal(s): Knowledge of disease and therapeutic regimen to maintain health will improve  Short Term Goals: Ability to demonstrate self-control, Ability to participate in decision making will improve, Ability to verbalize feelings will improve, Ability to disclose and discuss suicidal ideas, Ability to identify and develop effective coping behaviors will improve and Compliance with prescribed medications will improve  Medication Management: RN will administer medications as ordered by provider, will assess and evaluate patient's response and provide education to patient for prescribed medication. RN will report any adverse and/or side effects to prescribing provider.  Therapeutic Interventions: 1 on 1 counseling sessions, Psychoeducation, Medication administration, Evaluate responses to treatment, Monitor vital signs and CBGs as ordered, Perform/monitor CIWA, COWS, AIMS and Fall Risk screenings as ordered, Perform wound care treatments as ordered.  Evaluation of Outcomes: Not Met   LCSW Treatment Plan for Primary Diagnosis: Severe recurrent major depression without psychotic features ([place]) Long Term Goal(s): Safe transition to appropriate  next level of care at discharge, Engage patient in therapeutic group addressing interpersonal concerns.  Short Term Goals: Engage patient in aftercare planning with referrals and  resources  Therapeutic Interventions: Assess for all discharge needs, 1 to 1 time with Social worker, Explore available resources and support systems, Assess for adequacy in community support network, Educate family and significant other(s) on suicide prevention, Complete Psychosocial Assessment, Interpersonal group therapy.  Evaluation of Outcomes: Not Met   Progress in Treatment: Attending groups: No. New to unit  Participating in groups: No. Taking medication as prescribed: Yes. Toleration medication: Yes. Family/Significant other contact made: No, will contact:  if patient consents to collateral contacts Patient understands diagnosis: Yes. Discussing patient identified problems/goals with staff: Yes. Medical problems stabilized or resolved: Yes. Denies suicidal/homicidal ideation: Yes. Issues/concerns per patient self-inventory: No. Other:   New problem(s) identified: None   New Short Term/Long Term Goal(s):Detox, medication stabilization, elimination of SI thoughts, development of comprehensive mental wellness plan.    Patient Goals:  Patient did not participate in treatment team meeting   Discharge Plan or Barriers: Patient recently admitted. CSW will continue to follow and assess for appropriate referrals and possible discharge planning.    Reason for Continuation of Hospitalization: Depression Hallucinations Medication stabilization Suicidal ideation Other; describe Psychosis  Estimated Length of Stay: 3-5 days   Attendees: Patient: 01/12/2020 11:37 AM  Physician: Dr. Neita Garnet, MD 01/12/2020 11:37 AM  Nursing:  01/12/2020 11:37 AM  RN Care Manager: 01/12/2020 11:37 AM  Social Worker: Radonna Ricker, LCSW 01/12/2020 11:37 AM  Recreational Therapist:  01/12/2020 11:37 AM  Other:  01/12/2020 11:37 AM  Other:  01/12/2020 11:37 AM  Other: 01/12/2020 11:37 AM    Scribe for Treatment Team: Marylee Floras, [place] 01/12/2020 11:37 AM

## 2020-01-12 NOTE — Progress Notes (Signed)
Recreation Therapy Notes  Date:  3.3.21 Time: 0930 Location: [address] Dayroom  Group Topic: Stress Management  Goal Area(s) Addresses:  Patient will identify positive stress management techniques. Patient will identify benefits of using stress management post d/c.  Intervention: Stress Management  Activity :  Guided Imagery.  LRT read a script that took patients on a mental vacation to the beach to listen to the peaceful waves.  Patients were to listen and follow along as script was read to engage in activity.  Education:  Stress Management, Discharge Planning.   Education Outcome: Acknowledges Education  Clinical Observations/Feedback: Pt did not attend group activity.    Victorino Sparrow, LRT/CTRS        Ria Comment, Rhondalyn Clingan A 01/12/2020 11:01 AM
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
## 2020-01-13 LAB — HEMOGLOBIN A1C
Hgb A1c MFr Bld: 5.7 % — ABNORMAL HIGH (ref 4.8–5.6)
Mean Plasma Glucose: 116.89 mg/dL

## 2020-01-13 LAB — LIPID PANEL
Cholesterol: 189 mg/dL (ref 0–200)
HDL: 63 mg/dL (ref >40)
LDL Cholesterol: 114 mg/dL — ABNORMAL HIGH (ref 0–99)
Total CHOL/HDL Ratio: 3 RATIO
Triglycerides: 62 mg/dL (ref <150)
VLDL: 12 mg/dL (ref 0–40)

## 2020-01-13 LAB — TSH: TSH: 1.957 u[IU]/mL (ref 0.350–4.500)

## 2020-01-13 NOTE — Progress Notes (Signed)
[name] MD Progress Note  01/13/2020 3:38 PM Denise Knight  MRN:  [id number] Subjective: patient reports feeling " a little better". Denies medication side effects at this time. Denies suicidal ideations at present. Objective : I have discussed case with treatment team and have met with patient. 29 y old female, presented to hospital reporting depression, suicidal ideations, neuro-vegetative symptoms of depression, as well as chronic but recently exacerbated PTSD symptoms . She reported binge alcohol use and intermittent but not frequent cocaine use , but had been using cocaine on days leading to admission. History of depression, cocaine use disorder, had been admitted to [facility] in July 2020 for similar presentation.  Today patient reports feeling " a little better".She remains depressed with constricted and vaguely dysphoric affect . Denies suicidal ideations at this time. Presents calm, polite on approach. She does not endorse symptoms of WDL and is not presenting with tremors or diaphoresis. No restlessness or agitation. No disruptive or agitated behaviors on unit, limited milieu participation thus far . Denies medication side effects. Labs reviewed - HgbA1C5.7, TSH 1.95, Lipid panel unremarkable ( LDL 114)   Principal Problem: Severe recurrent major depression without psychotic features ([place]) Diagnosis: Principal Problem:   Severe recurrent major depression without psychotic features ([place]) Active Problems:   Cocaine abuse (HCC)   Alcohol use disorder, moderate, dependence ([place])  Total Time spent with patient: 15 minutes  Past Psychiatric History:   Past Medical History:  Past Medical History:  Diagnosis Date  . Allergy    seasonal  . Anxiety   . Arthritis    "left knee" (07/05/2016)  . Asthma   . Bipolar 1 disorder ([place])   . Depression   . GERD (gastroesophageal reflux disease)   . Hypertension   . MI (mitral incompetence)   . Post traumatic stress disorder   . Schizophrenia  ([place])   . Stomach ulcer   . Substance abuse ([place])    cocaine quit july 2020  . Suicide attempt [facility])     Past Surgical History:  Procedure Laterality Date  . CARDIAC CATHETERIZATION  07/05/2016  . CARDIAC CATHETERIZATION N/A 07/05/2016   Procedure: Left Heart Cath and Coronary Angiography;  Surgeon: Adrian Prows, MD;  Location: [place] [facility];  Service: Cardiovascular;  Laterality: N/A;  . CYST EXCISION Right    "wrist"  . DILATION AND CURETTAGE OF UTERUS    . FOOT SURGERY Bilateral    "corns removed"  . LEFT HEART CATH AND CORONARY ANGIOGRAPHY N/A 12/24/2017   Procedure: LEFT HEART CATH AND CORONARY ANGIOGRAPHY;  Surgeon: Nigel Mormon, MD;  Location: [place] [facility];  Service: Cardiovascular;  Laterality: N/A;  . TUBAL LIGATION     Family History:  Family History  Problem Relation Age of Onset  . Breast cancer Other   . Lung cancer Other   . Congestive Heart Failure Other   . Diabetes Other   . Hypertension Other   . Colon cancer Neg Hx   . Colon polyps Neg Hx   . Esophageal cancer Neg Hx   . Stomach cancer Neg Hx   . Rectal cancer Neg Hx    Family Psychiatric  History: Social History:  Social History   Substance and Sexual Activity  Alcohol Use Yes  . Alcohol/week: 7.0 standard drinks  . Types: 7 Cans of beer per week   Comment: socially,stopped july 2020     Social History   Substance and Sexual Activity  Drug Use Yes  . Types:  Cocaine, Marijuana   Comment: 2 days,july 2020 last used,08/06/19    Social History   Socioeconomic History  . Marital status: Widowed    Spouse name: Not on file  . Number of children: 2  . Years of education: Not on file  . Highest education level: Not on file  Occupational History  . Occupation: disabled   Tobacco Use  . Smoking status: Current Some Day Smoker    Packs/day: 0.25    Years: 35.00    Pack years: 8.75    Types: Cigarettes    Last attempt to quit: 05/30/2019    Years since quitting: 0.6  . Smokeless  tobacco: Never Used  Substance and Sexual Activity  . Alcohol use: Yes    Alcohol/week: 7.0 standard drinks    Types: 7 Cans of beer per week    Comment: socially,stopped july 2020  . Drug use: Yes    Types: Cocaine, Marijuana    Comment: 2 days,july 2020 last used,08/06/19  . Sexual activity: Yes    Birth control/protection: Surgical  Other Topics Concern  . Not on file  Social History Narrative  . Not on file   Social Determinants of Health   Financial Resource Strain:   . Difficulty of Paying Living Expenses: Not on file  Food Insecurity:   . Worried About Charity fundraiser in the Last Year: Not on file  . Ran Out of Food in the Last Year: Not on file  Transportation Needs:   . Lack of Transportation (Medical): Not on file  . Lack of Transportation (Non-Medical): Not on file  Physical Activity:   . Days of Exercise per Week: Not on file  . Minutes of Exercise per Session: Not on file  Stress:   . Feeling of Stress : Not on file  Social Connections:   . Frequency of Communication with Friends and Family: Not on file  . Frequency of Social Gatherings with Friends and Family: Not on file  . Attends Religious Services: Not on file  . Active Member of Clubs or Organizations: Not on file  . Attends Archivist Meetings: Not on file  . Marital Status: Not on file   Additional Social History:    Sleep: fair, improving   Appetite:  Fair  Current Medications: Current Facility-Administered Medications  Medication Dose Route Frequency Provider Last Rate Last Admin  . acetaminophen (TYLENOL) tablet 650 mg  650 mg Oral Q6H PRN Anike, Adaku C, NP      . alum & mag hydroxide-simeth (MAALOX/MYLANTA) 200-200-20 MG/5ML suspension 30 mL  30 mL Oral Q4H PRN Anike, Adaku C, NP      . amantadine (SYMMETREL) capsule 100 mg  100 mg Oral BID Nerea Bordenave, Myer Peer, MD   100 mg at 01/13/20 0900  . hydrOXYzine (ATARAX/VISTARIL) tablet 25 mg  25 mg Oral Q6H PRN Denton Derks, Myer Peer, MD       . LORazepam (ATIVAN) tablet 1 mg  1 mg Oral Q6H PRN Nalu Troublefield, Myer Peer, MD      . magnesium hydroxide (MILK OF MAGNESIA) suspension 30 mL  30 mL Oral Daily PRN Anike, Adaku C, NP      . multivitamin with minerals tablet 1 tablet  1 tablet Oral Daily Ecko Beasley, Myer Peer, MD   1 tablet at 01/13/20 0900  . nicotine (NICODERM CQ - dosed in mg/24 hr) patch 7 mg  7 mg Transdermal Daily Anike, Adaku C, NP   7 mg at 01/13/20 0901  .  sertraline (ZOLOFT) tablet 50 mg  50 mg Oral Daily Aziza Stuckert, Myer Peer, MD   50 mg at 01/13/20 0900  . thiamine tablet 100 mg  100 mg Oral Daily Karem Tomaso, Myer Peer, MD   100 mg at 01/13/20 0900  . traZODone (DESYREL) tablet 50 mg  50 mg Oral QHS PRN Anike, Adaku C, NP        Lab Results:  Results for orders placed or performed during the hospital encounter of 01/12/20 (from the past 48 hour(s))  Lipid panel     Status: Abnormal   Collection Time: 01/13/20  6:57 AM  Result Value Ref Range   Cholesterol 189 0 - 200 mg/dL   Triglycerides 62 <150 mg/dL   HDL 63 >40 mg/dL   Total CHOL/HDL Ratio 3.0 RATIO   VLDL 12 0 - 40 mg/dL   LDL Cholesterol 114 (H) 0 - 99 mg/dL    Comment:        Total Cholesterol/HDL:CHD Risk Coronary Heart Disease Risk Table                     Men   Women  1/2 Average Risk   3.4   3.3  Average Risk       5.0   4.4  2 X Average Risk   9.6   7.1  3 X Average Risk  23.4   11.0        Use the calculated Patient Ratio above and the CHD Risk Table to determine the patient's CHD Risk.        ATP III CLASSIFICATION (LDL):  <100     mg/dL   Optimal  100-129  mg/dL   Near or Above                    Optimal  130-159  mg/dL   Borderline  160-189  mg/dL   High  >190     mg/dL   Very High Performed at [facility] [address] [place], [place] [address]   TSH     Status: None   Collection Time: 01/13/20  6:57 AM  Result Value Ref Range   TSH 1.957 0.350 - 4.500 uIU/mL    Comment: Performed by a 3rd Generation assay with a  functional sensitivity of <=0.01 uIU/mL. Performed at [facility], [place] [address], [place], [place] [address]   Hemoglobin A1c     Status: Abnormal   Collection Time: 01/13/20  6:57 AM  Result Value Ref Range   Hgb A1c MFr Bld 5.7 (H) 4.8 - 5.6 %    Comment: (NOTE) Pre diabetes:          5.7%-6.4% Diabetes:              >6.4% Glycemic control for   <7.0% adults with diabetes    Mean Plasma Glucose 116.89 mg/dL    Comment: Performed at [facility] [address] [place], [place] [address]    Blood Alcohol level:  Lab Results  Component Value Date   ETH 33 (H) 01/11/2020   ETH <10 40/06/6760    Metabolic Disorder Labs: Lab Results  Component Value Date   HGBA1C 5.7 (H) 01/13/2020   MPG 116.89 01/13/2020   MPG 120 06/15/2017   No results found for: PROLACTIN Lab Results  Component Value Date   CHOL 189 01/13/2020   TRIG 62 01/13/2020   HDL 63 01/13/2020   CHOLHDL 3.0 01/13/2020  VLDL 12 01/13/2020   LDLCALC 114 (H) 01/13/2020   LDLCALC 137 (H) 07/05/2016    Physical Findings: AIMS: Facial and Oral Movements Muscles of Facial Expression: None, normal Lips and Perioral Area: None, normal Jaw: None, normal Tongue: None, normal,Extremity Movements Upper (arms, wrists, hands, fingers): None, normal Lower (legs, knees, ankles, toes): None, normal, Trunk Movements Neck, shoulders, hips: None, normal, Overall Severity Severity of abnormal movements (highest score from questions above): None, normal Incapacitation due to abnormal movements: None, normal Patient's awareness of abnormal movements (rate only patient's report): No Awareness, Dental Status Current problems with teeth and/or dentures?: No Does patient usually wear dentures?: No  CIWA:  CIWA-Ar Total: 1 COWS:  COWS Total Score: 2  Musculoskeletal: Strength & Muscle Tone: within normal limits Gait & Station: normal Patient leans: N/A  Psychiatric Specialty Exam: Physical  Exam  Review of Systems currently denies headache , no chest pain , no shortness of breath, endorses periumbilical pain , mainly after eating , denies vomiting or diarrhea.   Blood pressure (!) 143/99, pulse 96, temperature 98.1 F (36.7 C), temperature source Oral, resp. rate 18, height 5' 8.5" (1.74 m), weight 81.6 kg, SpO2 97 %.Body mass index is 26.97 kg/m.  General Appearance: Fairly Groomed  Eye Contact:  Fair  Speech:  Normal Rate  Volume:  Normal  Mood:  reports some improvement compared to admission, remains dysphoric, depressed   Affect:  Congruent  Thought Process:  Linear and Descriptions of Associations: Intact  Orientation:  Other:  fully alert and attentive  Thought Content:  no hallucinations, no delusions, not internally preoccupied   Suicidal Thoughts:  No currently denies suicidal or self injurious ideations, denies homicidal ideations, contracts for safety on unit   Homicidal Thoughts:  No  Memory:  recent and remote grossly intact   Judgement:  Fair/ improving  Insight:  Fair  Psychomotor Activity:  Decreased no tremors, no diaphoresis, no restlessness or agitation at present  Concentration:  Concentration: Fair and Attention Span: Fair  Recall:  Good  Fund of Knowledge:  Good  Language:  Good  Akathisia:  Negative  Handed:  Right  AIMS (if indicated):     Assets:  Desire for Improvement Resilience  ADL's:  Intact  Cognition:  WNL  Sleep:  Number of Hours: 6.75   Assessment -  1 y old female, presented to hospital reporting depression, suicidal ideations, neuro-vegetative symptoms of depression, as well as chronic but recently exacerbated PTSD symptoms . She reported binge alcohol use and intermittent but not frequent cocaine use , but had been using cocaine on days leading to admission. History of depression, cocaine use disorder, had been admitted to [facility] in July 2020 for similar presentation.  Currently patient describes some improvement compared to  admission but remains depressed, with a constricted/dysphoric affect. Denies SI. She is tolerating medications well thus far . Currently not presenting with symptoms of WDL   Treatment Plan Summary: Daily contact with patient to assess and evaluate symptoms and progress in treatment, Medication management, Plan inpatient treatment and medications as below Encourage group and milieu participation Encourage efforts to work on sobriety and relapse prevention Continue Zoloft 50 mgrs QDAY for depression Continue Amantadine 100 mgrs BID for cocaine use disorder /cravings, patient aware of off label use  Continue Ativan PRN for alcohol WDL as needed Continue Trazodone 50 mgrs QHS PRN for insomnia as needed  Treatment team working on disposition planning options Jenne Campus, MD 01/13/2020, 3:38 PM

## 2020-01-13 NOTE — BHH Group Notes (Signed)
Pt did not attend wrap up group this evening. Pt was in bed sleeping.
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
## 2020-01-13 NOTE — Progress Notes (Signed)
   01/13/20 2300  Psych Admission Type (Psych Patients Only)  Admission Status Voluntary  Psychosocial Assessment  Patient Complaints Depression  Eye Contact Fair  Facial Expression Anxious  Affect Depressed  Speech Logical/coherent  Interaction Assertive  Motor Activity Slow  Appearance/Hygiene Disheveled  Behavior Characteristics Guarded  Mood Depressed  Aggressive Behavior  Effect No apparent injury  Thought Process  Coherency WDL  Content WDL  Delusions WDL  Perception WDL  Hallucination Visual  Judgment Impaired  Confusion WDL  Danger to Self  Current suicidal ideation? Denies  Danger to Others  Danger to Others None reported or observed

## 2020-01-14 DIAGNOSIS — F1020 Alcohol dependence, uncomplicated: Secondary | ICD-10-CM

## 2020-01-14 MED ORDER — TEMAZEPAM 15 MG PO CAPS
15.0000 mg | ORAL_CAPSULE | Freq: Every evening | ORAL | Status: DC | PRN
  Administered 2020-01-14: 15 mg via ORAL
  Filled 2020-01-14: qty 1

## 2020-01-14 NOTE — Progress Notes (Signed)
Pt did not attend morning goals group. 

## 2020-01-14 NOTE — BHH Group Notes (Signed)
01/14/2020 8:45am Type of Group and Topic: Psychoeducational Group: Discharge Planning  Participation Level: Did Not Attend  Description of Group Discharge planning group reviews patient's anticipated discharge plans and assists patients to anticipate and address any barriers to wellness/recovery in the community. Suicide prevention education is reviewed with patients in group. Therapeutic Goals 1. Patients will state their anticipated discharge plan and mental health aftercare 2. Patients will identify potential barriers to wellness in the community setting 3. Patients will engage in problem solving, solution focused discussion of ways to anticipate and address barriers to wellness/recovery   Summary of Patient Progress Plan for Discharge/Comments:  Invited, chose not to attend.      Radonna Ricker, MSW, LCSW Clinical Social Worker [facility]  Phone: [phone] 01/14/2020 2:06 PM

## 2020-01-14 NOTE — Progress Notes (Signed)
   01/14/20 1000  Psych Admission Type (Psych Patients Only)  Admission Status Voluntary  Psychosocial Assessment  Patient Complaints Anxiety;Disorientation  Eye Contact Fair  Facial Expression Anxious  Affect Depressed  Speech Logical/coherent  Interaction Assertive  Motor Activity Slow  Appearance/Hygiene Disheveled  Behavior Characteristics Guarded  Mood Depressed;Anxious  Aggressive Behavior  Effect No apparent injury  Thought Process  Coherency WDL  Content WDL  Delusions WDL  Perception WDL  Hallucination Visual  Judgment Impaired  Confusion WDL  Danger to Self  Current suicidal ideation? Denies  Danger to Others  Danger to Others None reported or observed
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
## 2020-01-14 NOTE — Progress Notes (Signed)
Recreation Therapy Notes  Date: 3.5.21 Time: 0930 Location: [address] Dayroom  Group Topic: Stress Management  Goal Area(s) Addresses:  Patient will identify positive stress management techniques. Patient will identify benefits of using stress management post d/c.  Intervention: Stress Management  Activity :  Meditation.  LRT played a meditation that focused on making the most of your day and bringing positive intentions into how you maneuver through the day.  Education:  Stress Management, Discharge Planning.   Education Outcome: Acknowledges Education  Clinical Observations/Feedback: Pt did not attend group session.    Victorino Sparrow, LRT/CTRS         Ria Comment, [place] Mcphee A 01/14/2020 11:10 AM

## 2020-01-14 NOTE — Progress Notes (Signed)
   01/14/20 2300  Psych Admission Type (Psych Patients Only)  Admission Status Voluntary  Psychosocial Assessment  Patient Complaints Anxiety  Eye Contact Fair  Facial Expression Anxious  Affect Depressed  Speech Logical/coherent  Interaction Assertive  Motor Activity Slow  Appearance/Hygiene Disheveled  Behavior Characteristics Cooperative  Mood Depressed;Sad  Aggressive Behavior  Effect No apparent injury  Thought Process  Coherency WDL  Content WDL  Delusions WDL  Perception WDL  Hallucination Visual  Judgment WDL  Confusion WDL  Danger to Self  Current suicidal ideation? Denies  Danger to Others  Danger to Others None reported or observed

## 2020-01-15 MED ORDER — TRAZODONE HCL 100 MG PO TABS
100.0000 mg | ORAL_TABLET | Freq: Every day | ORAL | Status: DC
  Administered 2020-01-15: 100 mg via ORAL
  Filled 2020-01-15 (×3): qty 1

## 2020-01-15 MED ORDER — TRAZODONE HCL 50 MG PO TABS: 50.0000 mg | ORAL_TABLET | Freq: Every evening | ORAL | Status: DC | PRN

## 2020-01-15 NOTE — BHH Group Notes (Signed)
Adult Psychoeducational Group Note  Date:  01/15/2020 Time:  11:45 AM  Group Topic/Focus:  Goals Group:   The focus of this group is to help patients establish daily goals to achieve during treatment and discuss how the patient can incorporate goal setting into their daily lives to aide in recovery.  Participation Level:  Did Not Attend   Paulino Rily 01/15/2020, 11:45 AM

## 2020-01-15 NOTE — Progress Notes (Signed)
Pt rates depression 8/10, anxiety 8/10 and denies SI/HI. She reports feeling tired this morning due to not sleeping well last night. Pt denies racing thoughts or nightmares at bedtime.   Orders reviewed. V/s assessed. Verbal support provided. 15 minute checks performed for safety.  Pt compliant with taking meds and denies any side effects.

## 2020-01-15 NOTE — Progress Notes (Signed)
   01/15/20 2332  Psych Admission Type (Psych Patients Only)  Admission Status Voluntary  Psychosocial Assessment  Patient Complaints None  Eye Contact Fair  Facial Expression Flat  Affect Appropriate to circumstance  Speech Logical/coherent  Interaction Assertive  Motor Activity Other (Comment) (WDL)  Appearance/Hygiene Disheveled  Behavior Characteristics Appropriate to situation  Mood Depressed  Thought Process  Coherency WDL  Content WDL  Delusions None reported or observed  Perception WDL  Hallucination None reported or observed  Judgment WDL  Confusion None  Danger to Self  Current suicidal ideation? Denies  Danger to Others  Danger to Others None reported or observed

## 2020-01-15 NOTE — Progress Notes (Signed)
   01/15/20 2327  COVID-19 Daily Checkoff  Have you had a fever (temp > 37.80C/100F)  in the past 24 hours?  No  If you have had runny nose, nasal congestion, sneezing in the past 24 hours, has it worsened? No  COVID-19 EXPOSURE  Have you traveled outside the state in the past 14 days? No  Have you been in contact with someone with a confirmed diagnosis of COVID-19 or PUI in the past 14 days without wearing appropriate PPE? No  Have you been living in the same home as a person with confirmed diagnosis of COVID-19 or a PUI (household contact)? No  Have you been diagnosed with COVID-19? No

## 2020-01-15 NOTE — [facility] Suicide Risk Assessment (Signed)
[place] INPATIENT:  Family/Significant Other Suicide Prevention Education  Suicide Prevention Education:  Education Completed; daughter, Murlean Caller [phone]),  (name of family member/significant other) has been identified by the patient as the family member/significant other with whom the patient will be residing, and identified as the person(s) who will aid the patient in the event of a mental health crisis (suicidal ideations/suicide attempt).  With written consent from the patient, the family member/significant other has been provided the following suicide prevention education, prior to the and/or following the discharge of the patient.  Patient's daughter stated she is very familiar with suicide prevention and did not want the information reviewed in detail, so an overview only was given.  CSW told her that patient was indeed suicidal when she came into the hospital, so it definitely is important for her to watch her mother for signs she is deteriorating.  Daughter wanted to know how long patient would be in the hospital, was informed of patient's acceptance into [facility] on Tuesday 3/9.  CSW asked if daughter could provide a ride from the hospital to the facility for her mother and she explained her schedule and said it would not be possible.  The suicide prevention education provided includes the following:  Suicide risk factors  Suicide prevention and interventions  National Suicide Hotline telephone number  [facility] assessment telephone number  [facility] Emergency Assistance [place] and/or Residential Mobile Crisis Unit telephone number  Request made of family/significant other to:  Remove weapons (e.g., guns, rifles, knives), all items previously/currently identified as safety concern.    Remove drugs/medications (over-the-counter, prescriptions, illicit drugs), all items previously/currently identified as a safety concern.  The  family member/significant other verbalizes understanding of the suicide prevention education information provided.  The family member/significant other agrees to remove the items of safety concern listed above.  Berlin Hun Grossman-Orr 01/15/2020, 3:00 PM

## 2020-01-15 NOTE — [facility] Group Notes (Signed)
LCSW Group Therapy Note  01/15/2020   10:00-11:00am   Type of Therapy and Topic:  Group Therapy: Anger Cues and Responses  Participation Level:  None   Description of Group:   In this group, patients learned how to recognize the physical, cognitive, emotional, and behavioral responses they have to anger-provoking situations.  They identified a recent time they became angry and how they reacted.  They analyzed how their reaction was possibly beneficial and how it was possibly unhelpful.  The group discussed a variety of healthier coping skills that could help with such a situation in the future.  Focus was placed on how helpful it is to recognize the underlying emotions to our anger, because working on those can lead to a more permanent solution as well as our ability to focus on the important rather than the urgent.  Therapeutic Goals: Patients will remember their last incident of anger and how they felt emotionally and physically, what their thoughts were at the time, and how they behaved. Patients will identify how their behavior at that time worked for them, as well as how it worked against them. Patients will explore possible new behaviors to use in future anger situations. Patients will learn that anger itself is normal and cannot be eliminated, and that healthier reactions can assist with resolving conflict rather than worsening situations.  Summary of Patient Progress:  The patient was only present for the last 10 minutes of group, listened but passed when called on directly.  Therapeutic Modalities:   Cognitive Behavioral Therapy  Maretta Los

## 2020-01-15 NOTE — Progress Notes (Signed)
[facility] MD Progress Note  01/15/2020 8:30 AM Denise Knight  MRN:  [id number] Subjective:  "I didn't sleep last night."  Denise Knight found lying in bed. She appears fatigued. She was started on PRN Restoril 15 mg last night due to 50 mg trazodone being ineffective. She reports continued difficulty sleeping last night. 5.75 hours of sleep are recorded in the chart, but patient states she did not sleep more than 4 hours. She denies nightmares last night but states she simply could not sleep. She reports her mood is slightly improved today. She denies withdrawal symptoms, other than some persistent diaphoresis. She is planning to discharge to [facility] rehab. She has been isolating to her room. She denies any SI/HI/AVH.  From admission H&P: Patient presented to hospital voluntarily reporting chest pain, depression, suicidal ideations , with recent thoughts of overdosing. In addition to depression she also reports persistent and recently worsened symptoms of PTSD. She reports she has been drinking in binges , but states that at times several days or even weeks can go by without alcohol consumption. Last drank on day of admission.  She also reports intermittent but infrequent cocaine use .  Principal Problem: Severe recurrent major depression without psychotic features ([facility]) Diagnosis: Principal Problem:   Severe recurrent major depression without psychotic features ([place]) Active Problems:   Cocaine abuse ([place])   Alcohol use disorder, moderate, dependence ([place])  Total Time spent with patient: 15 minutes  Past Psychiatric History: See admission H&P  Past Medical History:  Past Medical History:  Diagnosis Date  . Allergy    seasonal  . Anxiety   . Arthritis    "left knee" (07/05/2016)  . Asthma   . Bipolar 1 disorder ([place])   . Depression   . GERD (gastroesophageal reflux disease)   . Hypertension   . MI (mitral incompetence)   . Post traumatic stress disorder   . Schizophrenia ([place])   .  Stomach ulcer   . Substance abuse ([place])    cocaine quit july 2020  . Suicide attempt [facility])     Past Surgical History:  Procedure Laterality Date  . CARDIAC CATHETERIZATION  07/05/2016  . CARDIAC CATHETERIZATION N/A 07/05/2016   Procedure: Left Heart Cath and Coronary Angiography;  Surgeon: Adrian Prows, MD;  Location: [place] [facility];  Service: Cardiovascular;  Laterality: N/A;  . CYST EXCISION Right    "wrist"  . DILATION AND CURETTAGE OF UTERUS    . FOOT SURGERY Bilateral    "corns removed"  . LEFT HEART CATH AND CORONARY ANGIOGRAPHY N/A 12/24/2017   Procedure: LEFT HEART CATH AND CORONARY ANGIOGRAPHY;  Surgeon: Nigel Mormon, MD;  Location: [place] [facility];  Service: Cardiovascular;  Laterality: N/A;  . TUBAL LIGATION     Family History:  Family History  Problem Relation Age of Onset  . Breast cancer Other   . Lung cancer Other   . Congestive Heart Failure Other   . Diabetes Other   . Hypertension Other   . Colon cancer Neg Hx   . Colon polyps Neg Hx   . Esophageal cancer Neg Hx   . Stomach cancer Neg Hx   . Rectal cancer Neg Hx    Family Psychiatric  History: See admission H&P Social History:  Social History   Substance and Sexual Activity  Alcohol Use Yes  . Alcohol/week: 7.0 standard drinks  . Types: 7 Cans of beer per week   Comment: socially,stopped july 2020     Social History  Substance and Sexual Activity  Drug Use Yes  . Types: Cocaine, Marijuana   Comment: 2 days,july 2020 last used,08/06/19    Social History   Socioeconomic History  . Marital status: Widowed    Spouse name: Not on file  . Number of children: 2  . Years of education: Not on file  . Highest education level: Not on file  Occupational History  . Occupation: disabled   Tobacco Use  . Smoking status: Current Some Day Smoker    Packs/day: 0.25    Years: 35.00    Pack years: 8.75    Types: Cigarettes    Last attempt to quit: 05/30/2019    Years since quitting: 0.6  .  Smokeless tobacco: Never Used  Substance and Sexual Activity  . Alcohol use: Yes    Alcohol/week: 7.0 standard drinks    Types: 7 Cans of beer per week    Comment: socially,stopped july 2020  . Drug use: Yes    Types: Cocaine, Marijuana    Comment: 2 days,july 2020 last used,08/06/19  . Sexual activity: Yes    Birth control/protection: Surgical  Other Topics Concern  . Not on file  Social History Narrative  . Not on file   Social Determinants of Health   Financial Resource Strain:   . Difficulty of Paying Living Expenses: Not on file  Food Insecurity:   . Worried About Charity fundraiser in the Last Year: Not on file  . Ran Out of Food in the Last Year: Not on file  Transportation Needs:   . Lack of Transportation (Medical): Not on file  . Lack of Transportation (Non-Medical): Not on file  Physical Activity:   . Days of Exercise per Week: Not on file  . Minutes of Exercise per Session: Not on file  Stress:   . Feeling of Stress : Not on file  Social Connections:   . Frequency of Communication with Friends and Family: Not on file  . Frequency of Social Gatherings with Friends and Family: Not on file  . Attends Religious Services: Not on file  . Active Member of Clubs or Organizations: Not on file  . Attends Archivist Meetings: Not on file  . Marital Status: Not on file   Additional Social History:                         Sleep: Poor  Appetite:  Good  Current Medications: Current Facility-Administered Medications  Medication Dose Route Frequency Provider Last Rate Last Admin  . acetaminophen (TYLENOL) tablet 650 mg  650 mg Oral Q6H PRN Anike, Adaku C, NP      . alum & mag hydroxide-simeth (MAALOX/MYLANTA) 200-200-20 MG/5ML suspension 30 mL  30 mL Oral Q4H PRN Anike, Adaku C, NP      . amantadine (SYMMETREL) capsule 100 mg  100 mg Oral BID Cobos, Myer Peer, MD   100 mg at 01/15/20 0815  . hydrOXYzine (ATARAX/VISTARIL) tablet 25 mg  25 mg Oral Q6H  PRN Cobos, Myer Peer, MD   25 mg at 01/15/20 0115  . LORazepam (ATIVAN) tablet 1 mg  1 mg Oral Q6H PRN Cobos, Myer Peer, MD      . magnesium hydroxide (MILK OF MAGNESIA) suspension 30 mL  30 mL Oral Daily PRN Anike, Adaku C, NP      . multivitamin with minerals tablet 1 tablet  1 tablet Oral Daily Cobos, Myer Peer, MD   1 tablet at  01/15/20 0815  . nicotine (NICODERM CQ - dosed in mg/24 hr) patch 7 mg  7 mg Transdermal Daily Anike, Adaku C, NP   7 mg at 01/15/20 0816  . sertraline (ZOLOFT) tablet 50 mg  50 mg Oral Daily Cobos, Myer Peer, MD   50 mg at 01/15/20 0815  . temazepam (RESTORIL) capsule 15 mg  15 mg Oral QHS PRN Lindon Romp A, NP   15 mg at 01/14/20 2124  . thiamine tablet 100 mg  100 mg Oral Daily Cobos, Myer Peer, MD   100 mg at 01/15/20 3709    Lab Results: No results found for this or any previous visit (from the past 50 hour(s)).  Blood Alcohol level:  Lab Results  Component Value Date   ETH 33 (H) 01/11/2020   ETH <10 64/38/3818    Metabolic Disorder Labs: Lab Results  Component Value Date   HGBA1C 5.7 (H) 01/13/2020   MPG 116.89 01/13/2020   MPG 120 06/15/2017   No results found for: PROLACTIN Lab Results  Component Value Date   CHOL 189 01/13/2020   TRIG 62 01/13/2020   HDL 63 01/13/2020   CHOLHDL 3.0 01/13/2020   VLDL 12 01/13/2020   LDLCALC 114 (H) 01/13/2020   LDLCALC 137 (H) 07/05/2016    Physical Findings: AIMS: Facial and Oral Movements Muscles of Facial Expression: None, normal Lips and Perioral Area: None, normal Jaw: None, normal Tongue: None, normal,Extremity Movements Upper (arms, wrists, hands, fingers): None, normal Lower (legs, knees, ankles, toes): None, normal, Trunk Movements Neck, shoulders, hips: None, normal, Overall Severity Severity of abnormal movements (highest score from questions above): None, normal Incapacitation due to abnormal movements: None, normal Patient's awareness of abnormal movements (rate only patient's  report): No Awareness, Dental Status Current problems with teeth and/or dentures?: No Does patient usually wear dentures?: No  CIWA:  CIWA-Ar Total: 0 COWS:  COWS Total Score: 2  Musculoskeletal: Strength & Muscle Tone: within normal limits Gait & Station: normal Patient leans: N/A  Psychiatric Specialty Exam: Physical Exam  Nursing note and vitals reviewed. Constitutional: She is oriented to person, place, and time. She appears well-developed and well-nourished.  Cardiovascular: Normal rate.  Respiratory: Effort normal.  Neurological: She is alert and oriented to person, place, and time.    Review of Systems  Constitutional: Negative.   Respiratory: Negative for cough and shortness of breath.   Psychiatric/Behavioral: Positive for dysphoric mood and sleep disturbance. Negative for agitation, behavioral problems, hallucinations, self-injury and suicidal ideas. The patient is not nervous/anxious and is not hyperactive.     Blood pressure (!) 124/97, pulse 99, temperature 98.2 F (36.8 C), temperature source Oral, resp. rate 16, height 5' 8.5" (1.74 m), weight 81.6 kg, SpO2 100 %.Body mass index is 26.97 kg/m.  General Appearance: Disheveled  Eye Contact:  Fair  Speech:  Slow  Volume:  Normal  Mood:  Depressed  Affect:  Congruent  Thought Process:  Coherent  Orientation:  Full (Time, Place, and Person)  Thought Content:  Logical  Suicidal Thoughts:  No  Homicidal Thoughts:  No  Memory:  Immediate;   Good Recent;   Good  Judgement:  Intact  Insight:  Fair  Psychomotor Activity:  Decreased  Concentration:  Concentration: Fair and Attention Span: Fair  Recall:  [employer] of Knowledge:  Fair  Language:  Good  Akathisia:  No  Handed:  Right  AIMS (if indicated):     Assets:  Communication Skills Desire for Improvement Financial Resources/Insurance  Resilience  ADL's:  Intact  Cognition:  WNL  Sleep:  Number of Hours: 5.75     Treatment Plan Summary: Daily contact  with patient to assess and evaluate symptoms and progress in treatment and Medication management   Continue inpatient hospitalization.  Discontinue Restoril Start trazodone 100 mg PO QHS for insomnia, with 50 mg PRN insomnia Continue Zoloft 50 mg PO daily for mood Continue Ativan 1 mg PO Q6HR PRN CIWA>10 for ETOH withdrawal Continue thiamine 100 mg PO daily for supplementation Continue Vistaril 25 mg PO Q6HR PRN anxiety Continue amantadine 100 mg PO BID for cocaine cravings  Patient will participate in the therapeutic group milieu.  Discharge disposition in progress.   Connye Burkitt, NP 01/15/2020, 8:30 AM

## 2020-01-15 NOTE — B[facility] Group Notes (Signed)
Adult Psychoeducational Group Note  Date:  01/15/2020 Time:  10:48 AM  Group Topic/Focus:  Goals Group:   The focus of this group is to help patients establish daily goals to achieve during treatment and discuss how the patient can incorporate goal setting into their daily lives to aide in recovery.  Participation Level:  Did Not Attend   Paulino Rily 01/15/2020, 10:48 AM

## 2020-01-16 MED ORDER — SERTRALINE HCL 25 MG PO TABS
75.0000 mg | ORAL_TABLET | Freq: Every day | ORAL | Status: DC
  Administered 2020-01-17 – 2020-01-18 (×2): 75 mg via ORAL
  Filled 2020-01-16 (×2): qty 3
  Filled 2020-01-16: qty 42

## 2020-01-16 MED ORDER — TRAZODONE HCL 100 MG PO TABS
100.0000 mg | ORAL_TABLET | Freq: Every evening | ORAL | Status: DC | PRN
  Administered 2020-01-16: 100 mg via ORAL

## 2020-01-16 MED ORDER — TRAZODONE HCL 150 MG PO TABS: 150.0000 mg | ORAL_TABLET | Freq: Every evening | ORAL | Status: DC | PRN

## 2020-01-16 NOTE — [facility] Group Notes (Signed)
Adult Psychoeducational Group Note  Date:  01/16/2020 Time:  10:32 PM  Group Topic/Focus:  Wrap-Up Group:   The focus of this group is to help patients review their daily goal of treatment and discuss progress on daily workbooks.  Participation Level:  Active  Participation Quality:  Appropriate and Attentive  Affect:  Appropriate  Cognitive:  Alert and Appropriate  Insight: Appropriate and Good  Engagement in Group:  Engaged  Modes of Intervention:  Discussion and Education  Additional Comments:  Pt attended and participated in wrap up group this evening and rated their day an 8/10, due to them being able to stay up. Pt completed their goal, which was to not sleep through the day.   Cristi Loron 01/16/2020, 10:32 PM

## 2020-01-16 NOTE — Progress Notes (Addendum)
[facility] MD Progress Note  01/16/2020 11:36 AM Chanelle Hodsdon  MRN:  [id number] Subjective: describes partial improvement compared to admission and describes improved mood but reports some ongoing neuro-vegetative symptoms such as decreased energy level, decreased appetite. Denies suicidal ideations. Objective: I have reviewed chart notes and have met with patient. 54 y old female, presented to hospital reporting depression, suicidal ideations, neuro-vegetative symptoms of depression, as well as chronic but recently exacerbated PTSD symptoms . She reported binge alcohol use and intermittent but not frequent cocaine use , but had been using cocaine on days leading to admission. History of depression, cocaine use disorder, had been admitted to [facility] in July 2020 for similar presentation.  Patient reports some improvement compared to admission and states " my mood is better" but does continue to present with a constricted, somewhat dysphoric affect and endorses some ongoing neuro-vegetative symptoms to include a sense of decreased energy level. No withdrawal symptoms noted or reported- no tremors, no diaphoresis, no restlessness or agitation. Remains motivated in sobriety and plans to go to [facility] once a bed becomes available early next week. Limited milieu/group participation thus far , but more visible on unit . Behavior on unit in good control, polite on approach. Denies medication side effects.  * Patient reports dry cough, which has been present for more than a year. She denies dyspnea or fever. She smokes cigarettes intermittently. COVID / Influenza negative on admission, CXRAy reported as normal on 01/11/20)      Principal Problem: Severe recurrent major depression without psychotic features ([place]) Diagnosis: Principal Problem:   Severe recurrent major depression without psychotic features ([place]) Active Problems:   Cocaine abuse ([place])   Alcohol use disorder, moderate, dependence  ([place])  Total Time spent with patient: 15 minutes  Past Psychiatric History: See admission H&P  Past Medical History:  Past Medical History:  Diagnosis Date  . Allergy    seasonal  . Anxiety   . Arthritis    "left knee" (07/05/2016)  . Asthma   . Bipolar 1 disorder ([place])   . Depression   . GERD (gastroesophageal reflux disease)   . Hypertension   . MI (mitral incompetence)   . Post traumatic stress disorder   . Schizophrenia ([place])   . Stomach ulcer   . Substance abuse ([place])    cocaine quit july 2020  . Suicide attempt [facility])     Past Surgical History:  Procedure Laterality Date  . CARDIAC CATHETERIZATION  07/05/2016  . CARDIAC CATHETERIZATION N/A 07/05/2016   Procedure: Left Heart Cath and Coronary Angiography;  Surgeon: Adrian Prows, MD;  Location: [place] [facility];  Service: Cardiovascular;  Laterality: N/A;  . CYST EXCISION Right    "wrist"  . DILATION AND CURETTAGE OF UTERUS    . FOOT SURGERY Bilateral    "corns removed"  . LEFT HEART CATH AND CORONARY ANGIOGRAPHY N/A 12/24/2017   Procedure: LEFT HEART CATH AND CORONARY ANGIOGRAPHY;  Surgeon: Nigel Mormon, MD;  Location: [place] [facility];  Service: Cardiovascular;  Laterality: N/A;  . TUBAL LIGATION     Family History:  Family History  Problem Relation Age of Onset  . Breast cancer Other   . Lung cancer Other   . Congestive Heart Failure Other   . Diabetes Other   . Hypertension Other   . Colon cancer Neg Hx   . Colon polyps Neg Hx   . Esophageal cancer Neg Hx   . Stomach cancer Neg Hx   .  Rectal cancer Neg Hx    Family Psychiatric  History: See admission H&P Social History:  Social History   Substance and Sexual Activity  Alcohol Use Yes  . Alcohol/week: 7.0 standard drinks  . Types: 7 Cans of beer per week   Comment: socially,stopped july 2020     Social History   Substance and Sexual Activity  Drug Use Yes  . Types: Cocaine, Marijuana   Comment: 2 days,july 2020 last used,08/06/19     Social History   Socioeconomic History  . Marital status: Widowed    Spouse name: Not on file  . Number of children: 2  . Years of education: Not on file  . Highest education level: Not on file  Occupational History  . Occupation: disabled   Tobacco Use  . Smoking status: Current Some Day Smoker    Packs/day: 0.25    Years: 35.00    Pack years: 8.75    Types: Cigarettes    Last attempt to quit: 05/30/2019    Years since quitting: 0.6  . Smokeless tobacco: Never Used  Substance and Sexual Activity  . Alcohol use: Yes    Alcohol/week: 7.0 standard drinks    Types: 7 Cans of beer per week    Comment: socially,stopped july 2020  . Drug use: Yes    Types: Cocaine, Marijuana    Comment: 2 days,july 2020 last used,08/06/19  . Sexual activity: Yes    Birth control/protection: Surgical  Other Topics Concern  . Not on file  Social History Narrative  . Not on file   Social Determinants of Health   Financial Resource Strain:   . Difficulty of Paying Living Expenses: Not on file  Food Insecurity:   . Worried About Charity fundraiser in the Last Year: Not on file  . Ran Out of Food in the Last Year: Not on file  Transportation Needs:   . Lack of Transportation (Medical): Not on file  . Lack of Transportation (Non-Medical): Not on file  Physical Activity:   . Days of Exercise per Week: Not on file  . Minutes of Exercise per Session: Not on file  Stress:   . Feeling of Stress : Not on file  Social Connections:   . Frequency of Communication with Friends and Family: Not on file  . Frequency of Social Gatherings with Friends and Family: Not on file  . Attends Religious Services: Not on file  . Active Member of Clubs or Organizations: Not on file  . Attends Archivist Meetings: Not on file  . Marital Status: Not on file   Additional Social History:   Sleep: Fair  Appetite:  Fair/improving   Current Medications: Current Facility-Administered Medications   Medication Dose Route Frequency Provider Last Rate Last Admin  . acetaminophen (TYLENOL) tablet 650 mg  650 mg Oral Q6H PRN Anike, Adaku C, NP      . alum & mag hydroxide-simeth (MAALOX/MYLANTA) 200-200-20 MG/5ML suspension 30 mL  30 mL Oral Q4H PRN Anike, Adaku C, NP      . amantadine (SYMMETREL) capsule 100 mg  100 mg Oral BID Dirck Butch, Myer Peer, MD   100 mg at 01/16/20 0744  . magnesium hydroxide (MILK OF MAGNESIA) suspension 30 mL  30 mL Oral Daily PRN Anike, Adaku C, NP      . multivitamin with minerals tablet 1 tablet  1 tablet Oral Daily Leahmarie Gasiorowski, Myer Peer, MD   1 tablet at 01/16/20 0745  . nicotine (NICODERM CQ -  dosed in mg/24 hr) patch 7 mg  7 mg Transdermal Daily Anike, Adaku C, NP   7 mg at 01/16/20 0745  . sertraline (ZOLOFT) tablet 50 mg  50 mg Oral Daily Desi Rowe, Myer Peer, MD   50 mg at 01/16/20 0745  . thiamine tablet 100 mg  100 mg Oral Daily Emmanuella Mirante, Myer Peer, MD   100 mg at 01/16/20 0745  . traZODone (DESYREL) tablet 100 mg  100 mg Oral QHS Connye Burkitt, NP   100 mg at 01/15/20 2107  . traZODone (DESYREL) tablet 50 mg  50 mg Oral QHS PRN Connye Burkitt, NP        Lab Results: No results found for this or any previous visit (from the past 46 hour(s)).  Blood Alcohol level:  Lab Results  Component Value Date   ETH 33 (H) 01/11/2020   ETH <10 59/56/3875    Metabolic Disorder Labs: Lab Results  Component Value Date   HGBA1C 5.7 (H) 01/13/2020   MPG 116.89 01/13/2020   MPG 120 06/15/2017   No results found for: PROLACTIN Lab Results  Component Value Date   CHOL 189 01/13/2020   TRIG 62 01/13/2020   HDL 63 01/13/2020   CHOLHDL 3.0 01/13/2020   VLDL 12 01/13/2020   LDLCALC 114 (H) 01/13/2020   LDLCALC 137 (H) 07/05/2016    Physical Findings: AIMS: Facial and Oral Movements Muscles of Facial Expression: None, normal Lips and Perioral Area: None, normal Jaw: None, normal Tongue: None, normal,Extremity Movements Upper (arms, wrists, hands, fingers): None,  normal Lower (legs, knees, ankles, toes): None, normal, Trunk Movements Neck, shoulders, hips: None, normal, Overall Severity Severity of abnormal movements (highest score from questions above): None, normal Incapacitation due to abnormal movements: None, normal Patient's awareness of abnormal movements (rate only patient's report): No Awareness, Dental Status Current problems with teeth and/or dentures?: No Does patient usually wear dentures?: No  CIWA:  CIWA-Ar Total: 0 COWS:  COWS Total Score: 2  Musculoskeletal: Strength & Muscle Tone: within normal limits no tremors, no diaphoresis, no restlessness or agitation Gait & Station: normal Patient leans: N/A  Psychiatric Specialty Exam: Physical Exam  Nursing note and vitals reviewed. Constitutional: She is oriented to person, place, and time. She appears well-developed and well-nourished.  Cardiovascular: Normal rate.  Respiratory: Effort normal.  Neurological: She is alert and oriented to person, place, and time.    Review of Systems  Constitutional: Negative.   Respiratory: Negative for cough and shortness of breath.   Psychiatric/Behavioral: Positive for dysphoric mood and sleep disturbance. Negative for agitation, behavioral problems, hallucinations, self-injury and suicidal ideas. The patient is not nervous/anxious and is not hyperactive.   no headache, no chest pain, no shortness of breath, reports chronic dry cough ( for months, COVID / Influenza negative on admission, CXRAy reported as normal on 01/11/20) , no shortness of breath, no fever or chills   Blood pressure (!) 124/97, pulse 99, temperature 98.2 F (36.8 C), temperature source Oral, resp. rate 16, height 5' 8.5" (1.74 m), weight 81.6 kg, SpO2 100 %.Body mass index is 26.97 kg/m.  General Appearance: Fairly Groomed  Eye Contact:  Fair, improving   Speech:  Normal Rate  Volume:  Normal  Mood:  reports feeling better, but still presents depressed  Affect:  constricted    Thought Process:  Linear and Descriptions of Associations: Intact  Orientation:  Full (Time, Place, and Person)  Thought Content:  no hallucinations, no delusions   Suicidal Thoughts:  No denies suicidal or self injurious ideations, denies homicidal or violent ideations  Homicidal Thoughts:  No  Memory:  recent and remote grossly intact   Judgement:  Other:  improving   Insight:  Fair and improving  Psychomotor Activity:  Decreased  Concentration:  Concentration: Good and Attention Span: Good  Recall:  Good  Fund of Knowledge:  Good  Language:  Good  Akathisia:  No  Handed:  Right  AIMS (if indicated):     Assets:  Communication Skills Desire for Improvement Financial Resources/Insurance Resilience  ADL's:  Intact  Cognition:  WNL  Sleep:  Number of Hours: 6.75   Assessment -  77 y old female, presented to hospital reporting depression, suicidal ideations, neuro-vegetative symptoms of depression, as well as chronic but recently exacerbated PTSD symptoms . She reported binge alcohol use and intermittent but not frequent cocaine use , but had been using cocaine on days leading to admission. History of depression, cocaine use disorder, had been admitted to [facility] in July 2020 for similar presentation.  Patient reports feeling better than she did on admission, but remains somewhat depressed and constricted in affect . Denies SI and is future oriented, expressing interest in going to rehab at discharge. No alcohol WDL symptoms. Tolerating Zoloft well thus far. Reports chronic dry cough ( months to years ) , no dyspnea. Admission CXRay negative.  Treatment Plan Summary: Daily contact with patient to assess and evaluate symptoms and progress in treatment and Medication management  Treatment Plan reviewed as below today 3/7  Encourage group and milieu participation Encourage efforts to work on sobriety and relapse prevention Treatement team working on disposition planning options- patient  wants to go to Rehab at discharge Continue inpatient hospitalization. Continue Trazodone 100 mgrs QHS PRN for insomnia Increase Zoloft to 75 mg PO daily for mood Continue Ativan 1 mg PO Q6HR PRN CIWA>10 for ETOH withdrawal Continue thiamine 100 mg PO daily for supplementation Continue Vistaril 25 mg PO Q6HR PRN anxiety Continue Amantadine 100 mg PO BID for cocaine cravings    Jenne Campus, MD 01/16/2020, 11:36 AM   Patient ID: Dulcy Fanny, female   DOB: 1966/05/16, 54 y.o.   MRN: [id number]

## 2020-01-16 NOTE — BHH Group Notes (Signed)
Adult Psychoeducational Group Note  Date:  01/16/2020 Time:  5:34 PM  Group Topic/Focus:  Progressive Relaxation:  Learning how to deep breathe and to tighten and relax muscles in the bocy. Also used is Imagery of being on a beach  Participation Level:  Minimal  Participation Quality:  Inattentive  Affect:  Flat  Cognitive:  Appropriate  Insight: Lacking  Engagement in Group:  Lacking  Modes of Intervention:  Activity, Role-play and Support  Additional Comments:  Pt rested through much of the group and didn't follow the cadence of the leader  Paulino Rily 01/16/2020, 5:34 PM

## 2020-01-16 NOTE — Progress Notes (Signed)
Pt denies SI/HI. She appeared irritable on approach and stated she did not sleep well last night. She denied having racing thoughts or nightmares at bedtime. She reported ongoing depression and is isolative to her room.   Orders reviewed. V/s assessed. Verbal support provided. Pt encouraged to attend groups. 15 minute checks performed for safety.   Pt compliant with taking meds and denies any side effects.

## 2020-01-16 NOTE — BHH Group Notes (Signed)
[place] LCSW Group Therapy Note  01/16/2020    Type of Therapy and Topic:  Group Therapy:  Adding Supports Including Yourself  Participation Level:  Did Not Attend   Description of Group:   Patients in this group were introduced to the concept that additional supports including self-support are an essential part of recovery.  Patients listed what supports they believe they need to add to their lives to achieve their goals at discharge, and they listed such things as therapist, family, doctor, support groups, 12-step groups and service animals.   A song entitled "My Own Hero" was played and a group discussion ensued in which patients stated they could relate to the song and it inspired them to realize they have be willing to help themselves in order to succeed, because other people cannot achieve sobriety or stability for them.  "Fight For It" was played, then "I Am Enough" to encourage patients.  They discussed the impact on them and how they must remain convinced that their lives are worth the effort it takes to become sober and/or stable.  Therapeutic Goals: 1)  demonstrate the importance of being a key part of one's own support system 2)  discuss various available supports 3)  encourage patient to use music as part of their self-support and focus on goals 4)  elicit ideas from patients about supports that need to be added   Summary of Patient Progress:  The patient did not attend group.   Therapeutic Modalities:   Motivational Interviewing Activity  Berlin Hun Grossman-Orr  8:31 AM

## 2020-01-16 NOTE — BHH Group Notes (Signed)
Adult Psychoeducational Group Note  Date:  01/16/2020 Time:  5:36 PM  Group Topic/Focus:  Making Healthy Choices:   The focus of this group is to help patients identify negative/unhealthy choices they were using prior to admission and identify positive/healthier coping strategies to replace them upon discharge.  Participation Level:  Did Not Attend  Paulino Rily 01/16/2020, 5:36 PM

## 2020-01-17 MED ORDER — NICOTINE 7 MG/24HR TD PT24: 7.0000 mg | MEDICATED_PATCH | Freq: Every day | TRANSDERMAL | 0 refills | Status: DC

## 2020-01-17 MED ORDER — PANTOPRAZOLE SODIUM 40 MG PO TBEC: 40.0000 mg | DELAYED_RELEASE_TABLET | Freq: Every day | ORAL | 0 refills | Status: DC

## 2020-01-17 MED ORDER — TRAZODONE HCL 100 MG PO TABS
200.0000 mg | ORAL_TABLET | Freq: Every evening | ORAL | Status: DC | PRN
  Administered 2020-01-17: 200 mg via ORAL
  Filled 2020-01-17: qty 2
  Filled 2020-01-17: qty 28

## 2020-01-17 MED ORDER — AMANTADINE HCL 100 MG PO CAPS: 100.0000 mg | ORAL_CAPSULE | Freq: Two times a day (BID) | ORAL | 0 refills | Status: DC

## 2020-01-17 MED ORDER — TRAZODONE HCL 100 MG PO TABS: 200.0000 mg | ORAL_TABLET | Freq: Every evening | ORAL | 0 refills | Status: DC | PRN

## 2020-01-17 MED ORDER — PANTOPRAZOLE SODIUM 40 MG PO TBEC
40.0000 mg | DELAYED_RELEASE_TABLET | Freq: Every day | ORAL | Status: DC
  Administered 2020-01-17 – 2020-01-18 (×2): 40 mg via ORAL
  Filled 2020-01-17 (×2): qty 1
  Filled 2020-01-17: qty 14
  Filled 2020-01-17: qty 1

## 2020-01-17 MED ORDER — SERTRALINE HCL 25 MG PO TABS: 75.0000 mg | ORAL_TABLET | Freq: Every day | ORAL | 0 refills | Status: DC

## 2020-01-17 NOTE — Progress Notes (Signed)
Recreation Therapy Notes  Date:  3.8.21 Time: 0930 Location: [address] Dayroom  Group Topic: Stress Management  Goal Area(s) Addresses:  Patient will identify positive stress management techniques. Patient will identify benefits of using stress management post d/c.  Intervention: Stress Management  Activity :  Meditation.  LRT played a meditation on being resilient in the face of adversity.  Patients were to focus and listen to the meditation as it played to fully engage in activity.    Education:  Stress Management, Discharge Planning.   Education Outcome: Acknowledges Education  Clinical Observations/Feedback:  Pt did not attend activity.    Victorino Sparrow, LRT/CTRS         Victorino Sparrow A 01/17/2020 11:49 AM

## 2020-01-17 NOTE — Progress Notes (Signed)
   01/17/20 0006  COVID-19 Daily Checkoff  Have you had a fever (temp > 37.80C/100F)  in the past 24 hours?  No  If you have had runny nose, nasal congestion, sneezing in the past 24 hours, has it worsened? No  COVID-19 EXPOSURE  Have you traveled outside the state in the past 14 days? No  Have you been in contact with someone with a confirmed diagnosis of COVID-19 or PUI in the past 14 days without wearing appropriate PPE? No  Have you been living in the same home as a person with confirmed diagnosis of COVID-19 or a PUI (household contact)? No  Have you been diagnosed with COVID-19? No

## 2020-01-17 NOTE — BHH Suicide Risk Assessment (Signed)
[facility] Discharge Suicide Risk Assessment   Principal Problem: Severe recurrent major depression without psychotic features [facility]) Discharge Diagnoses: Principal Problem:   Severe recurrent major depression without psychotic features ([place]) Active Problems:   Cocaine abuse ([place])   Alcohol use disorder, moderate, dependence ([place])   Total Time spent with patient: 30 minutes  Musculoskeletal: Strength & Muscle Tone: within normal limits Gait & Station: normal Patient leans: N/A  Psychiatric Specialty Exam: Review of Systems denies headache, no chest pain, no shortness of breath, no vomiting , no fever or chills , no rash  Blood pressure (!) 124/97, pulse 99, temperature 98.2 F (36.8 C), temperature source Oral, resp. rate 16, height 5' 8.5" (1.74 m), weight 81.6 kg, SpO2 100 %.Body mass index is 26.97 kg/m.  General Appearance: improving grooming   Eye Contact::  Good  Speech:  Normal Rate409  Volume:  Normal  Mood:  reports improving mood and reports she is feeling signficantly better than on admission  Affect:  still mildly constricted, but smiles at times appropriately  Thought Process:  Linear and Descriptions of Associations: Intact  Orientation:  Full (Time, Place, and Person)  Thought Content:  no hallucinations, no delusions expresse d  Suicidal Thoughts:  No denies suicidal or self injurious ideations, denies homicidal or violent ideations   Homicidal Thoughts:  No  Memory:  recent and remote grossly intact   Judgement:  Other:  improving   Insight:  improving   Psychomotor Activity:  Normal- no psychomotor agitation or restlessness   Concentration:  Good  Recall:  Good  Fund of Knowledge:Good  Language: Good  Akathisia:  Negative  Handed:  Right  AIMS (if indicated):     Assets:  Communication Skills Desire for Improvement Resilience  Sleep:  Number of Hours: 5.5  Cognition: WNL  ADL's:  Intact   Mental Status Per Nursing Assessment::   On Admission:  Suicidal  ideation indicated by patient  Demographic Factors:  80. Widowed. Two adult children. Employed .  Loss Factors: Substance abuse, recurrence of PTSD symptoms   Historical Factors: History of depression, history of PTSD, history of Cocaine Use Disorder,   Risk Reduction Factors:   Sense of responsibility to family and Positive coping skills or problem solving skills  Continued Clinical Symptoms:  At this time patient is alert, attentive, well related, calm, reports mood as improved, affect remains vaguely constricted but is more reactive than on admission, no thought disorder, no suicidal or self injurious ideations, no homicidal or violent ideations, no psychotic symptoms, future oriented. Has been visible in day room, going to some groups, visible in day room. Denies WDL symptoms- presents calm, in no acute distress, no tremors or diaphoresis Denies medication side effects. Side effects reviewed.  Cognitive Features That Contribute To Risk:  No gross cognitive deficits noted upon discharge. Is alert , attentive, and oriented x 3   Suicide Risk:  Mild:  Suicidal ideation of limited frequency, intensity, duration, and specificity.  There are no identifiable plans, no associated intent, mild dysphoria and related symptoms, good self-control (both objective and subjective assessment), few other risk factors, and identifiable protective factors, including available and accessible social support.  Follow-up Information    Services, [facility] Follow up.   Why: Accepted for admission screening on Tuesday, 01/18/2020 at 7:45am. Please be sure to bring your 14 day supply of medications and 30 day presciptions. Also be sure to bring any discharge paperwork from this hospitalization.  Contact information: [address]  [place] [address] [phone]        [facility]. Call.   Why: Upon completion of your residential treatment program, please follow up for medication management  and therapy services. Walk-in hours are Monday-Friday from 8:00am-5:00pm.  Contact information: [address] [place] [address] [phone]           Plan Of Care/Follow-up recommendations:  Activity:  as tolerated  Diet:  regular Tests:  NA Other:  See below  Patient is scheduled to discharge tomorrow early in the morning, going to [facility] for admission at 7,45 AM. She plans to follow up as above . She is discharging in good spirits .  Jenne Campus, MD 01/17/2020, 3:34 PM

## 2020-01-17 NOTE — Tx Team (Signed)
Interdisciplinary Treatment and Diagnostic Plan Update  01/17/2020 Time of Session: 9:40am Denise Knight MRN: [id number]  Principal Diagnosis: Severe recurrent major depression without psychotic features [facility])  Secondary Diagnoses: Principal Problem:   Severe recurrent major depression without psychotic features ([place]) Active Problems:   Cocaine abuse ([place])   Alcohol use disorder, moderate, dependence ([place])   Current Medications:  Current Facility-Administered Medications  Medication Dose Route Frequency Provider Last Rate Last Admin  . acetaminophen (TYLENOL) tablet 650 mg  650 mg Oral Q6H PRN Anike, Adaku C, NP      . alum & mag hydroxide-simeth (MAALOX/MYLANTA) 200-200-20 MG/5ML suspension 30 mL  30 mL Oral Q4H PRN Anike, Adaku C, NP      . amantadine (SYMMETREL) capsule 100 mg  100 mg Oral BID Cobos, Myer Peer, MD   100 mg at 01/17/20 0900  . magnesium hydroxide (MILK OF MAGNESIA) suspension 30 mL  30 mL Oral Daily PRN Anike, Adaku C, NP      . multivitamin with minerals tablet 1 tablet  1 tablet Oral Daily Cobos, Myer Peer, MD   1 tablet at 01/17/20 0900  . nicotine (NICODERM CQ - dosed in mg/24 hr) patch 7 mg  7 mg Transdermal Daily Anike, Adaku C, NP   7 mg at 01/17/20 0900  . sertraline (ZOLOFT) tablet 75 mg  75 mg Oral Daily Cobos, Myer Peer, MD   75 mg at 01/17/20 0900  . thiamine tablet 100 mg  100 mg Oral Daily Cobos, Myer Peer, MD   100 mg at 01/17/20 0900  . traZODone (DESYREL) tablet 150 mg  150 mg Oral QHS PRN Rozetta Nunnery, NP       PTA Medications: Medications Prior to Admission  Medication Sig Dispense Refill Last Dose  . albuterol (PROVENTIL HFA;VENTOLIN HFA) 108 (90 BASE) MCG/ACT inhaler Inhale 2 puffs into the lungs every 6 (six) hours as needed for wheezing or shortness of breath.     Marland Kitchen amantadine (SYMMETREL) 100 MG capsule Take 1 capsule (100 mg total) by mouth 2 (two) times daily. 60 capsule 1   . dicyclomine (BENTYL) 20 MG tablet Take 1 tablet (20  mg total) by mouth 2 (two) times daily as needed for spasms. (Patient not taking: Reported on 01/11/2020) 20 tablet 0   . FLUoxetine (PROZAC) 20 MG capsule Take 1 capsule (20 mg total) by mouth daily. 30 capsule 1   . hydrOXYzine (ATARAX/VISTARIL) 25 MG tablet Take 25-50 mg by mouth at bedtime.     . [EXPIRED] metroNIDAZOLE (FLAGYL) 500 MG tablet Take 500 mg by mouth 2 (two) times daily. On day 6 of 7-day course     . nitroGLYCERIN (NITROSTAT) 0.4 MG SL tablet Place 1 tablet (0.4 mg total) under the tongue every 5 (five) minutes x 3 doses as needed for chest pain. 25 tablet 1   . omeprazole (PRILOSEC) 20 MG capsule Take 20 mg by mouth daily.     . predniSONE (STERAPRED UNI-PAK 21 TAB) 10 MG (21) TBPK tablet Take by mouth daily. Take 6 tabs by mouth daily  for 2 days, then 5 tabs for 2 days, taper 1 tab every 2 days (Patient not taking: Reported on 01/11/2020) 42 tablet 0   . traZODone (DESYREL) 50 MG tablet Take 50 mg by mouth at bedtime.       Patient Stressors: Marital or family conflict Medication change or noncompliance Substance abuse  Patient Strengths: General fund of knowledge Motivation for treatment/growth Supportive family/friends  Treatment Modalities:  Medication Management, Group therapy, Case management,  1 to 1 session with clinician, Psychoeducation, Recreational therapy.   Physician Treatment Plan for Primary Diagnosis: Severe recurrent major depression without psychotic features ([place]) Long Term Goal(s): Improvement in symptoms so as ready for discharge Improvement in symptoms so as ready for discharge   Short Term Goals: Ability to identify changes in lifestyle to reduce recurrence of condition will improve Ability to verbalize feelings will improve Ability to disclose and discuss suicidal ideas Ability to demonstrate self-control will improve Ability to identify and develop effective coping behaviors will improve Ability to maintain clinical measurements within normal  limits will improve Compliance with prescribed medications will improve Ability to identify triggers associated with substance abuse/mental health issues will improve Ability to identify changes in lifestyle to reduce recurrence of condition will improve Ability to verbalize feelings will improve Ability to disclose and discuss suicidal ideas Ability to demonstrate self-control will improve Ability to identify and develop effective coping behaviors will improve Ability to maintain clinical measurements within normal limits will improve Compliance with prescribed medications will improve Ability to identify triggers associated with substance abuse/mental health issues will improve  Medication Management: Evaluate patient's response, side effects, and tolerance of medication regimen.  Therapeutic Interventions: 1 to 1 sessions, Unit Group sessions and Medication administration.  Evaluation of Outcomes: Adequate for Discharge  Physician Treatment Plan for Secondary Diagnosis: Principal Problem:   Severe recurrent major depression without psychotic features ([place]) Active Problems:   Cocaine abuse ([facility])   Alcohol use disorder, moderate, dependence ([place])  Long Term Goal(s): Improvement in symptoms so as ready for discharge Improvement in symptoms so as ready for discharge   Short Term Goals: Ability to identify changes in lifestyle to reduce recurrence of condition will improve Ability to verbalize feelings will improve Ability to disclose and discuss suicidal ideas Ability to demonstrate self-control will improve Ability to identify and develop effective coping behaviors will improve Ability to maintain clinical measurements within normal limits will improve Compliance with prescribed medications will improve Ability to identify triggers associated with substance abuse/mental health issues will improve Ability to identify changes in lifestyle to reduce recurrence of condition will  improve Ability to verbalize feelings will improve Ability to disclose and discuss suicidal ideas Ability to demonstrate self-control will improve Ability to identify and develop effective coping behaviors will improve Ability to maintain clinical measurements within normal limits will improve Compliance with prescribed medications will improve Ability to identify triggers associated with substance abuse/mental health issues will improve     Medication Management: Evaluate patient's response, side effects, and tolerance of medication regimen.  Therapeutic Interventions: 1 to 1 sessions, Unit Group sessions and Medication administration.  Evaluation of Outcomes: Adequate for Discharge   RN Treatment Plan for Primary Diagnosis: Severe recurrent major depression without psychotic features ([place]) Long Term Goal(s): Knowledge of disease and therapeutic regimen to maintain health will improve  Short Term Goals: Ability to demonstrate self-control, Ability to participate in decision making will improve, Ability to verbalize feelings will improve, Ability to disclose and discuss suicidal ideas, Ability to identify and develop effective coping behaviors will improve and Compliance with prescribed medications will improve  Medication Management: RN will administer medications as ordered by provider, will assess and evaluate patient's response and provide education to patient for prescribed medication. RN will report any adverse and/or side effects to prescribing provider.  Therapeutic Interventions: 1 on 1 counseling sessions, Psychoeducation, Medication administration, Evaluate responses to treatment, Monitor vital signs and CBGs as ordered,  Perform/monitor CIWA, COWS, AIMS and Fall Risk screenings as ordered, Perform wound care treatments as ordered.  Evaluation of Outcomes: Adequate for Discharge   LCSW Treatment Plan for Primary Diagnosis: Severe recurrent major depression without psychotic  features ([place]) Long Term Goal(s): Safe transition to appropriate next level of care at discharge, Engage patient in therapeutic group addressing interpersonal concerns.  Short Term Goals: Engage patient in aftercare planning with referrals and resources  Therapeutic Interventions: Assess for all discharge needs, 1 to 1 time with Social worker, Explore available resources and support systems, Assess for adequacy in community support network, Educate family and significant other(s) on suicide prevention, Complete Psychosocial Assessment, Interpersonal group therapy.  Evaluation of Outcomes: Adequate for Discharge   Progress in Treatment: Attending groups: Yes.  Participating in groups: Yes. Taking medication as prescribed: Yes. Toleration medication: Yes. Family/Significant other contact made: Yes, individual(s) contacted:  the patient's daughter Patient understands diagnosis: Yes. Discussing patient identified problems/goals with staff: Yes. Medical problems stabilized or resolved: Yes. Denies suicidal/homicidal ideation: Yes. Issues/concerns per patient self-inventory: No. Other:   New problem(s) identified: None   New Short Term/Long Term Goal(s):Detox, medication stabilization, elimination of SI thoughts, development of comprehensive mental wellness plan.    Patient Goals:  Patient did not participate in treatment team meeting   Discharge Plan or Barriers: Patient will discharge to [facility] for substance use treatment. She has also been referred for medication management and therapy services at [facility], once she completes her residential treatment programming. CSW will continue to follow.     Reason for Continuation of Hospitalization: Depression Medication stabilization Suicidal ideation  Estimated Length of Stay: 3-5 days   Attendees: Patient: 01/17/2020 9:48 AM  Physician: Dr. Neita Garnet, MD 01/17/2020 9:48 AM  Nursing:  01/17/2020 9:48 AM  RN Care Manager:  01/17/2020 9:48 AM  Social Worker: Radonna Ricker, LCSW 01/17/2020 9:48 AM  Recreational Therapist:  01/17/2020 9:48 AM  Other:  01/17/2020 9:48 AM  Other:  01/17/2020 9:48 AM  Other: 01/17/2020 9:48 AM    Scribe for Treatment Team: Marylee Floras, [place] 01/17/2020 9:48 AM

## 2020-01-17 NOTE — BHH Group Notes (Signed)
LCSW Group Therapy Note 01/17/2020 3:56 PM  Type of Therapy and Topic: Group Therapy: Overcoming Obstacles  Participation Level: Active  Description of Group:  In this group patients will be encouraged to explore what they see as obstacles to their own wellness and recovery. They will be guided to discuss their thoughts, feelings, and behaviors related to these obstacles. The group will process together ways to cope with barriers, with attention given to specific choices patients can make. Each patient will be challenged to identify changes they are motivated to make in order to overcome their obstacles. This group will be process-oriented, with patients participating in exploration of their own experiences as well as giving and receiving support and challenge from other group members.  Therapeutic Goals: 1. Patient will identify personal and current obstacles as they relate to admission. 2. Patient will identify barriers that currently interfere with their wellness or overcoming obstacles.  3. Patient will identify feelings, thought process and behaviors related to these barriers. 4. Patient will identify two changes they are willing to make to overcome these obstacles:   Summary of Patient Progress  Denise Knight was engaged and participated throughout the group session. Denise Knight reports that her main obstacle at this time is "I'm fearful for what is ahead". Denise Knight shared that her sentiments were in regards to her going to a residential treatment facility at discharge. Denise Knight states that she is very anxious and typically is afraid of change. Denise Knight reports she is ready for this "change" because it will help her get to her "old self".    Therapeutic Modalities:  Cognitive Behavioral Therapy Solution Focused Therapy Motivational Interviewing Relapse Prevention Therapy   Theresa Duty Clinical Social Worker

## 2020-01-17 NOTE — Progress Notes (Signed)
   01/17/20 0011  Psych Admission Type (Psych Patients Only)  Admission Status Voluntary  Psychosocial Assessment  Patient Complaints Insomnia  Eye Contact Fair  Facial Expression Flat  Affect Appropriate to circumstance  Speech Logical/coherent  Interaction Assertive  Motor Activity Other (Comment) (WDL)  Appearance/Hygiene Unremarkable  Behavior Characteristics Appropriate to situation  Mood Depressed;Pleasant  Thought Process  Coherency WDL  Content WDL  Delusions None reported or observed  Perception WDL  Hallucination None reported or observed  Judgment WDL  Confusion None  Danger to Self  Current suicidal ideation? Denies  Danger to Others  Danger to Others None reported or observed

## 2020-01-17 NOTE — Progress Notes (Signed)
Patient learned today that she "can do this". Her goal for tomorrow is to go to rehab.

## 2020-01-17 NOTE — Progress Notes (Signed)
ADULT GRIEF GROUP NOTE:  Spiritual care group on grief and loss facilitated by chaplain Jerene Pitch  Group Goal:  Support / Education around grief and loss  Members engage in facilitated group support and psycho-social education.  Group Description:  Following introductions and group rules, group members engaged in facilitated group dialog and support around topic of loss, with particular support around experiences of loss in their lives. Group Identified types of loss (relationships / self / things) and identified patterns, circumstances, and changes that precipitate losses. Reflected on thoughts / feelings around loss, normalized grief responses, and recognized variety in grief experience.  Patient Progress: PT INVITED. DID NOT ATTEND

## 2020-01-17 NOTE — Progress Notes (Signed)
D:  Patient's self inventory sheet, patient has fair sleep, sleep medication helpful.  Fair appetite, low energy level, good concentration.  Rated depression 6, hopeless 10, anxiety 5.  Denied withdrawals.  Pain in belly button.  Denied SI.  Physical problems, pain in belly button.   Does have discharge plans. A:  Medications administered per MD orders.  Emotional support and encouragement given patient. R:  Denied SI and HI, contracts for safety.  Denied A/V hallucinations.  Safety maintained with 15 minute checks.

## 2020-01-18 NOTE — Progress Notes (Signed)
Nurs Dischg Note:  D:Patient denies SI/HI/AVH at this time. Pt appears calm and cooperative, and no distress noted.  A: All Personal items in locker returned to pt. Pt given AVS/ [facility] / [facility]/ Copy prescriptions with samples Pt escorted to the lobby to wait for ride to [facility]. Pt given morning medication  R:  Pt States she will comply with outpatient / other services, and take MEDS as prescribed.

## 2020-01-18 NOTE — Progress Notes (Signed)
   01/18/20 0000  Psych Admission Type (Psych Patients Only)  Admission Status Voluntary  Psychosocial Assessment  Patient Complaints Anxiety  Eye Contact Fair  Facial Expression Flat  Affect Appropriate to circumstance  Speech Logical/coherent  Interaction Assertive  Motor Activity Other (Comment) (WDL)  Appearance/Hygiene Unremarkable  Behavior Characteristics Anxious  Mood Depressed;Anxious  Thought Process  Coherency WDL  Content WDL  Delusions None reported or observed  Perception WDL  Hallucination None reported or observed  Judgment WDL  Confusion None  Danger to Self  Current suicidal ideation? Denies  Danger to Others  Danger to Others None reported or observed

## 2020-01-18 NOTE — Progress Notes (Signed)
  [facility] Adult Case Management Discharge Plan :  Will you be returning to the same living situation after discharge:  No. Patient discharged to [facility] for substance use treatment.  At discharge, do you have transportation home?: Yes,  patient was transported via [employer] Do you have the ability to pay for your medications: Yes,  [facility]  Release of information consent forms completed and in the chart;  Patient's signature needed at discharge.  Patient to Follow up at: Follow-up Information    [facility] Follow up.   Why: Accepted for admission screening on Tuesday, 01/18/2020 at 7:45am. Please be sure to bring your 14 day supply of medications and 30 day presciptions. Also be sure to bring any discharge paperwork from this hospitalization.  Contact information: [address] [place] [address] [phone]        [facility]. Call.   Why: Upon completion of your residential treatment program, please follow up for medication management and therapy services. Walk-in hours are Monday-Friday from 8:00am-5:00pm.  Contact information: [place] [address] [phone]           Next level of care provider has access to [place] and Suicide Prevention discussed: Yes,  with the patient's daughter  Have you used any form of tobacco in the last 30 days? (Cigarettes, Smokeless Tobacco, Cigars, and/or Pipes): Yes  Has patient been referred to the Quitline?: Patient refused referral  Patient has been referred for addiction treatment: Yes  Marylee Floras, [place] 01/18/2020, 9:04 AM
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
## 2020-02-17 ENCOUNTER — Emergency Department (HOSPITAL_COMMUNITY)

## 2020-02-17 ENCOUNTER — Emergency Department (HOSPITAL_COMMUNITY)
Admission: EM | Admit: 2020-02-17 | Discharge: 2020-02-17 | Disposition: A | Discharge status: Home / Self Care | Attending: Emergency Medicine | Admitting: Emergency Medicine

## 2020-02-17 ENCOUNTER — Encounter (HOSPITAL_COMMUNITY): Payer: Self-pay

## 2020-02-17 ENCOUNTER — Other Ambulatory Visit: Payer: Self-pay

## 2020-02-17 DIAGNOSIS — I10 Essential (primary) hypertension: Secondary | ICD-10-CM | POA: Insufficient documentation

## 2020-02-17 DIAGNOSIS — F17210 Nicotine dependence, cigarettes, uncomplicated: Secondary | ICD-10-CM | POA: Insufficient documentation

## 2020-02-17 DIAGNOSIS — R05 Cough: Secondary | ICD-10-CM

## 2020-02-17 DIAGNOSIS — R059 Cough, unspecified: Secondary | ICD-10-CM

## 2020-02-17 DIAGNOSIS — Z79899 Other long term (current) drug therapy: Secondary | ICD-10-CM | POA: Diagnosis not present

## 2020-02-17 DIAGNOSIS — J45909 Unspecified asthma, uncomplicated: Secondary | ICD-10-CM

## 2020-02-17 MED ORDER — PREDNISONE 20 MG PO TABS
60.0000 mg | ORAL_TABLET | Freq: Once | ORAL | Status: AC
  Administered 2020-02-17: 60 mg via ORAL
  Filled 2020-02-17: qty 3

## 2020-02-17 MED ORDER — PREDNISONE 10 MG (21) PO TBPK: ORAL_TABLET | ORAL | 0 refills | Status: DC

## 2020-02-17 NOTE — Discharge Instructions (Signed)
Take prednisone as directed.  Use your inhalers and nebulizers as directed.  Return the emergency department for any worsening difficulty breathing, chest pain, vomiting, or any other worsening concerning symptoms.

## 2020-02-17 NOTE — ED Provider Notes (Signed)
[facility] EMERGENCY DEPARTMENT Provider Note   CSN: [id number] Arrival date & time: 02/17/20  1103     History Chief Complaint  Patient presents with  . Cough    Denise Knight is a 54 y.o. female past medical history of asthma, bipolar 1, depression, GERD, hypertension who presents for evaluation of cough that has been ongoing for last 10 days.  Patient reports that cough is intermittently productive of mucus.  Sometimes it is a dry cough.  She states that she has tried taking Mucinex and over-the-counter Robitussin with no improvement.  She has also been using her nebulizer and breathing treatments at home.  She states occasionally, will provide some temporary relief but then the symptoms return.  Patient feels like she is having some coughing fits that cause her to have some trouble breathing.  She feels like she is wheezing.  She also reports some abdominal and chest soreness secondary to the cough.  She states when she gets the fits when she is coughing and having trouble breathing, it feels tight vertically when she takes deep breath in.  He has not noted any fevers.  She does report that several years ago, she had a admission for asthma.  She has never required intubation for asthma.  She does report that she recently drove to [facility] but otherwise no prior travel greater than 6 hours.  She states occasionally, she will get some left ankle swelling but states that is improved.  Denies any history of blood clots in her legs or lungs, history of exogenous hormone use, recent surgeries, history of cancer.  The history is provided by the patient.       Past Medical History:  Diagnosis Date  . Allergy    seasonal  . Anxiety   . Arthritis    "left knee" (07/05/2016)  . Asthma   . Bipolar 1 disorder ([place])   . Depression   . GERD (gastroesophageal reflux disease)   . Hypertension   . MI (mitral incompetence)   . Post traumatic stress disorder   .  Schizophrenia ([place])   . Stomach ulcer   . Substance abuse ([place])    cocaine quit july 2020  . Suicide attempt [facility])     Patient Active Problem List   Diagnosis Date Noted  . MDD (major depressive disorder) 01/12/2020  . Severe recurrent major depression without psychotic features ([place]) 01/12/2020  . Alcohol use disorder, moderate, dependence ([place]) 01/12/2020  . MDD (major depressive disorder), recurrent, severe, with psychosis ([place]) 06/01/2019  . Cocaine abuse ([place]) 12/27/2017  . Rectal bleeding 12/27/2017  . NSTEMI (non-ST elevated myocardial infarction) ([place]) 12/23/2017  . Acute viral bronchitis 06/14/2017  . Asthma exacerbation 06/14/2017  . Chest pain 07/05/2016    Past Surgical History:  Procedure Laterality Date  . CARDIAC CATHETERIZATION  07/05/2016  . CARDIAC CATHETERIZATION N/A 07/05/2016   Procedure: Left Heart Cath and Coronary Angiography;  Surgeon: Adrian Prows, MD;  Location: [facility];  Service: Cardiovascular;  Laterality: N/A;  . CYST EXCISION Right    "wrist"  . DILATION AND CURETTAGE OF UTERUS    . FOOT SURGERY Bilateral    "corns removed"  . LEFT HEART CATH AND CORONARY ANGIOGRAPHY N/A 12/24/2017   Procedure: LEFT HEART CATH AND CORONARY ANGIOGRAPHY;  Surgeon: Nigel Mormon, MD;  Location: [facility];  Service: Cardiovascular;  Laterality: N/A;  . TUBAL LIGATION       OB History  No obstetric history on file.     Family History  Problem Relation Age of Onset  . Breast cancer Other   . Lung cancer Other   . Congestive Heart Failure Other   . Diabetes Other   . Hypertension Other   . Colon cancer Neg Hx   . Colon polyps Neg Hx   . Esophageal cancer Neg Hx   . Stomach cancer Neg Hx   . Rectal cancer Neg Hx     Social History   Tobacco Use  . Smoking status: Current Some Day Smoker    Packs/day: 0.25    Years: 35.00    Pack years: 8.75    Types: Cigarettes    Last attempt to quit: 05/30/2019    Years since quitting: 0.7   . Smokeless tobacco: Never Used  Substance Use Topics  . Alcohol use: Yes    Alcohol/week: 7.0 standard drinks    Types: 7 Cans of beer per week    Comment: socially,stopped july 2020  . Drug use: Yes    Types: Cocaine, Marijuana    Comment: 2 days,july 2020 last used,08/06/19    Home Medications Prior to Admission medications   Medication Sig Start Date End Date Taking? Authorizing Provider  amantadine (SYMMETREL) 100 MG capsule Take 1 capsule (100 mg total) by mouth 2 (two) times daily. 01/17/20  Yes Connye Burkitt, NP  FLUoxetine (PROZAC) 20 MG capsule Take 20 mg by mouth daily.   Yes [provider]  guaiFENesin (MUCINEX) 600 MG 12 hr tablet Take 600 mg by mouth 2 (two) times daily.   Yes [provider]  guaiFENesin-dextromethorphan (ROBITUSSIN DM) 100-10 MG/5ML syrup Take 15-30 mLs by mouth every 4 (four) hours as needed for cough.    Yes [provider]  pantoprazole (PROTONIX) 40 MG tablet Take 1 tablet (40 mg total) by mouth daily. 01/18/20  Yes Connye Burkitt, NP  sertraline (ZOLOFT) 25 MG tablet Take 3 tablets (75 mg total) by mouth daily. 01/18/20  Yes Connye Burkitt, NP  traZODone (DESYREL) 100 MG tablet Take 2 tablets (200 mg total) by mouth at bedtime as needed for sleep. 01/17/20  Yes Connye Burkitt, NP  predniSONE (STERAPRED UNI-PAK 21 TAB) 10 MG (21) TBPK tablet Take 6 tabs by mouth daily  for 2 days, then 5 tabs for 2 days, then 4 tabs for 2 days, then 3 tabs for 2 days, 2 tabs for 2 days, then 1 tab by mouth daily for 2 days 02/17/20   Providence Lanius A, PA-C    Allergies    Aspirin, Food, and Morphine and related  Review of Systems   Review of Systems  Constitutional: Negative for fever.  Respiratory: Positive for cough, chest tightness, shortness of breath and wheezing.   Cardiovascular: Negative for chest pain.  Gastrointestinal: Negative for abdominal pain, nausea and vomiting.  All other systems reviewed and are negative.   Physical  Exam Updated Vital Signs BP (!) 145/90   Pulse 70   Temp 98.4 F (36.9 C) (Oral)   Resp 18   Ht 5' 8"  (1.727 m)   Wt 81.6 kg   SpO2 99%   BMI 27.37 kg/m   Physical Exam Vitals and nursing note reviewed.  Constitutional:      Appearance: She is well-developed.  HENT:     Head: Normocephalic and atraumatic.  Eyes:     General: No scleral icterus.       Right eye: No discharge.  Left eye: No discharge.     Conjunctiva/sclera: Conjunctivae normal.  Pulmonary:     Effort: Pulmonary effort is normal.     Comments: Able to speak in full sentences with any difficulty.  Intermittently coughing.  Some decreased air movement noted some very mild wheezing noted in the upper lung fields.  No evidence of respiratory distress. Musculoskeletal:     Comments: Bilateral lower extremities are symmetric in appearance without any overlying warmth, erythema, edema.  Skin:    General: Skin is warm and dry.  Neurological:     Mental Status: She is alert.  Psychiatric:        Speech: Speech normal.        Behavior: Behavior normal.     ED Results / Procedures / Treatments   Labs (all labs ordered are listed, but only abnormal results are displayed) Labs Reviewed - No data to display  EKG None  Radiology DG Chest 2 View  Result Date: 02/17/2020 CLINICAL DATA:  Cough productive cough and shortness of breath. EXAM: CHEST - 2 VIEW COMPARISON:  01/11/2020 FINDINGS: Mild cardiac enlargement. Lungs are clear.  No pleural effusion. Visualized skeletal structures are unremarkable. IMPRESSION: No active cardiopulmonary disease. Electronically Signed   By: Zetta Bills M.D.   On: 02/17/2020 11:52    Procedures Procedures (including critical care time)  Medications Ordered in ED Medications  predniSONE (DELTASONE) tablet 60 mg (60 mg Oral Given 02/17/20 1342)    ED Course  I have reviewed the triage vital signs and the nursing notes.  Pertinent labs & imaging results that were  available during my care of the patient were reviewed by me and considered in my medical decision making (see chart for details).    MDM Rules/Calculators/A&P                      54 year old female who presents for evaluation of cough x10 days.  Associated with some shortness of breath, chest tightness with coughing fits.  No fevers.  History of asthma does feel like she is wheezing.  She uses her breathing treatments with some improvement in symptoms but then they returned.  Comes in today because she is continued to have cough.  On initial arrival, she is afebrile, nontoxic-appearing.  Vital signs are stable.  She is slightly tachycardic but I suspect this is most likely due to coughing.  Chest x-ray ordered at triage.  Consider infectious etiology versus asthma exacerbation.  History/physical exam is not concerning for PE.  She did tell me she had some mild left ankle swelling but on my evaluation, her legs appear symmetric in appearance without any overlying warmth, erythema, edema.  Chest x-ray shows no evidence of infectious etiology.  Patient able to ambulate in the ED while maintaining O2 sats 100%.  Reevaluation.  Patient is resting comfortably.  Her vitals have stabilized and heart rate is now at 70s.  She is eating a sandwich without any difficulty.  Repeat lung exam shows improved breath sounds and wheezing has resolved.  Patient reports she feels better.  At this time, I suspect this is most likely due to an asthma exacerbation.  We will put her on a course of prednisone to help with symptomatic relief.  Encouraged primary care follow-up. At this time, patient exhibits no emergent life-threatening condition that require further evaluation in ED or admission.  Patient had ample opportunity for questions and discussion. All patient's questions were answered with full understanding. Strict return precautions  discussed. Patient expresses understanding and agreement to plan.   Portions of this  note were generated with [employer]. Dictation errors may occur despite best attempts at proofreading.  Final Clinical Impression(s) / ED Diagnoses Final diagnoses:  Cough  Asthma, unspecified asthma severity, unspecified whether complicated, unspecified whether persistent    Rx / DC Orders ED Discharge Orders         Ordered    predniSONE (STERAPRED UNI-PAK 21 TAB) 10 MG (21) TBPK tablet     02/17/20 1501           Volanda Napoleon, PA-C 02/17/20 1516    Malvin Johns, MD 02/17/20 1527

## 2020-02-17 NOTE — ED Notes (Signed)
Pt ambulated in hall with no assistance. Oxygen levels remained at 100%. Pt is back in bed resting.

## 2020-02-17 NOTE — ED Triage Notes (Signed)
Pt reports cough for 2 weeks now, taking mucinex and robitussin Dm without relief. Pt has hx of asthma. Taking neb treatments at home without relief. Pt a.o, resp e.u at this time.

## 2020-02-17 NOTE — ED Notes (Signed)
Verbalized understanding of DC instructions, Rx, follow up care 

## 2020-02-18 ENCOUNTER — Telehealth: Payer: Self-pay

## 2020-02-18 NOTE — Telephone Encounter (Signed)
Patient called in to state medication was not at pharmacy that was supposed to be called in ( prednisone) looked up information in the chart. Called [facility] on [address]. They did not have the prescription. Spoke to pharmacist and gave him the information. Called the patient back to notify her that the script would be ready in one hour.

## 2020-07-28 ENCOUNTER — Other Ambulatory Visit: Payer: Self-pay

## 2020-07-28 ENCOUNTER — Encounter (HOSPITAL_COMMUNITY): Payer: Self-pay

## 2020-07-28 ENCOUNTER — Emergency Department (HOSPITAL_COMMUNITY)
Admission: EM | Admit: 2020-07-28 | Discharge: 2020-07-28 | Disposition: A | Discharge status: Home / Self Care | Attending: Emergency Medicine | Admitting: Emergency Medicine

## 2020-07-28 ENCOUNTER — Emergency Department (HOSPITAL_COMMUNITY)

## 2020-07-28 DIAGNOSIS — Z5321 Procedure and treatment not carried out due to patient leaving prior to being seen by health care provider: Secondary | ICD-10-CM | POA: Insufficient documentation

## 2020-07-28 DIAGNOSIS — M79645 Pain in left finger(s): Secondary | ICD-10-CM | POA: Diagnosis not present

## 2020-07-28 DIAGNOSIS — M79602 Pain in left arm: Secondary | ICD-10-CM | POA: Insufficient documentation

## 2020-07-28 DIAGNOSIS — R55 Syncope and collapse: Secondary | ICD-10-CM | POA: Diagnosis not present

## 2020-07-28 HISTORY — DX: Type 2 diabetes mellitus without complications: E11.9

## 2020-07-28 LAB — CBC
HCT: 39.1 % (ref 36.0–46.0)
Hemoglobin: 12.2 g/dL (ref 12.0–15.0)
MCH: 24.9 pg — ABNORMAL LOW (ref 26.0–34.0)
MCHC: 31.2 g/dL (ref 30.0–36.0)
MCV: 80 fL (ref 80.0–100.0)
Platelets: 344 10*3/uL (ref 150–400)
RBC: 4.89 MIL/uL (ref 3.87–5.11)
RDW: 20.8 % — ABNORMAL HIGH (ref 11.5–15.5)
WBC: 7.8 10*3/uL (ref 4.0–10.5)
nRBC: 0 % (ref 0.0–0.2)

## 2020-07-28 LAB — BASIC METABOLIC PANEL
Anion gap: 7 (ref 5–15)
BUN: 12 mg/dL (ref 6–20)
CO2: 21 mmol/L — ABNORMAL LOW (ref 22–32)
Calcium: 9 mg/dL (ref 8.9–10.3)
Chloride: 109 mmol/L (ref 98–111)
Creatinine, Ser: 1.04 mg/dL — ABNORMAL HIGH (ref 0.44–1.00)
GFR calc Af Amer: >60 mL/min (ref >60)
GFR calc non Af Amer: >60 mL/min (ref >60)
Glucose, Bld: 108 mg/dL — ABNORMAL HIGH (ref 70–99)
Potassium: 4.2 mmol/L (ref 3.5–5.1)
Sodium: 137 mmol/L (ref 135–145)

## 2020-07-28 IMAGING — CR DG FINGER RING 2+V*L*
3 series · 3 of 3 positions shown · non-contrast
Comparison: None.

CLINICAL DATA: Left ring finger pain.

EXAM:
LEFT RING FINGER 2+V

[x finger pa left]
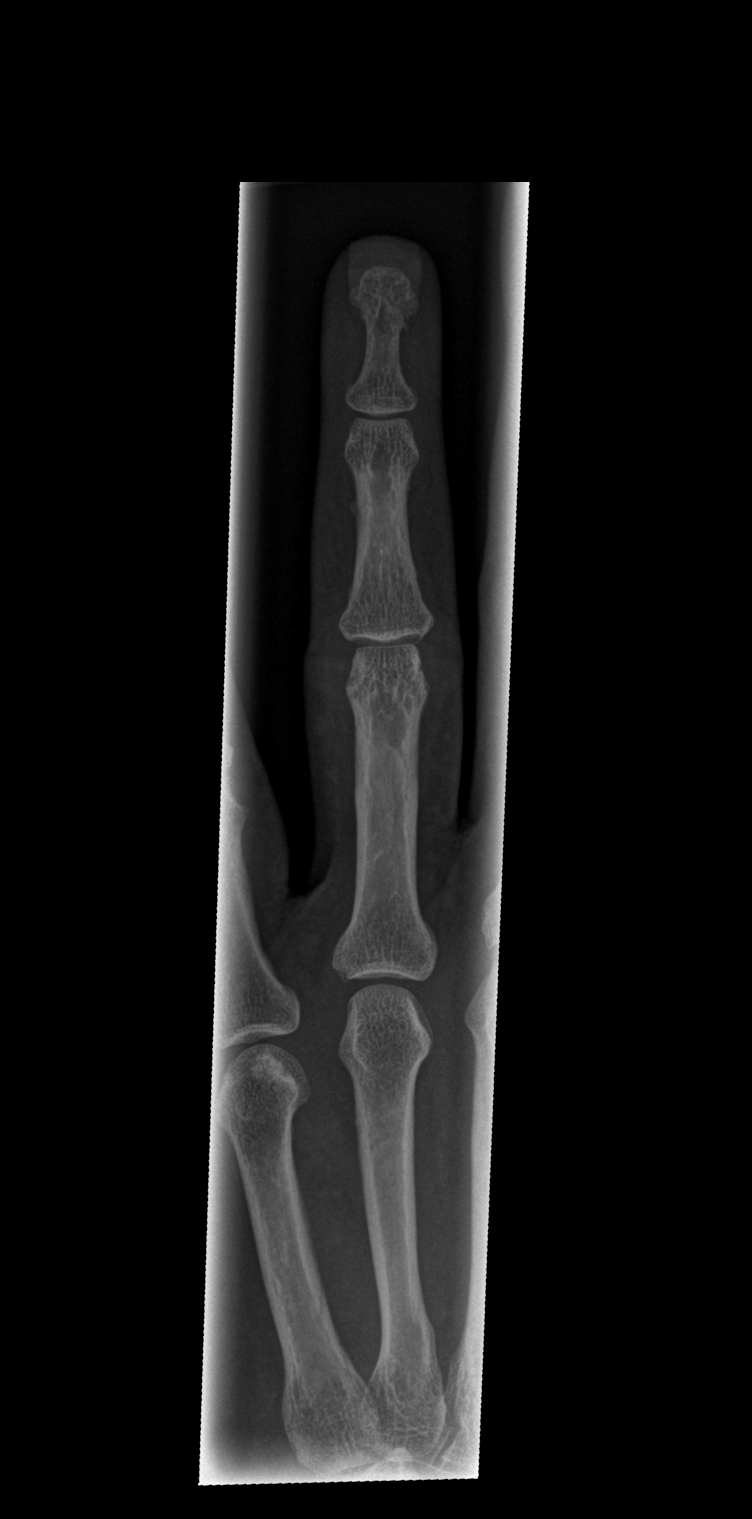

[x finger obl left]
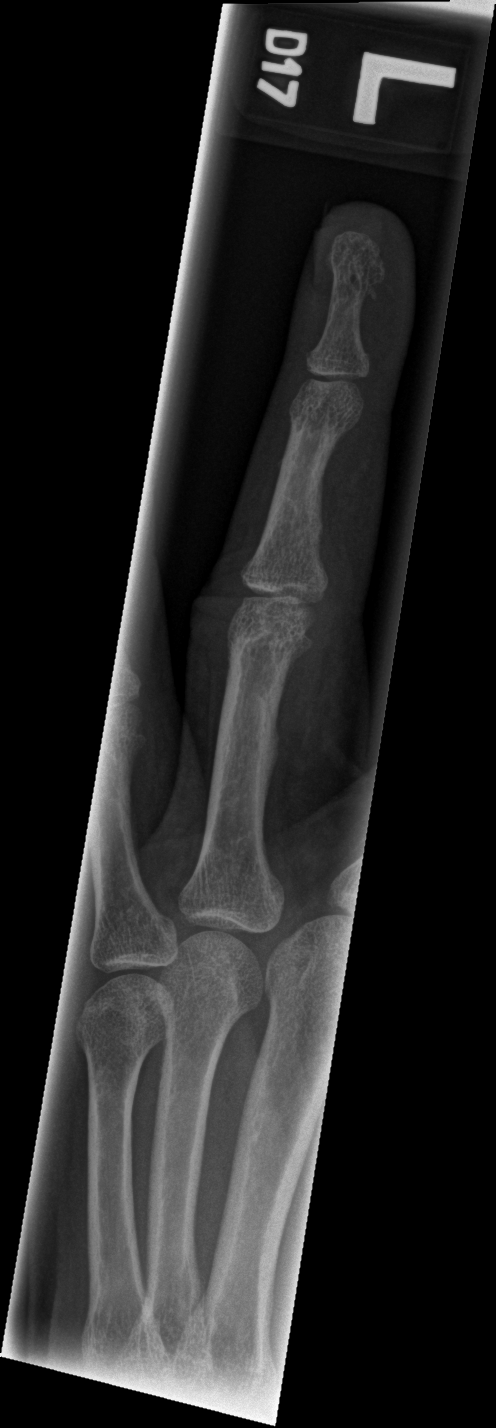

[x finger lat left]
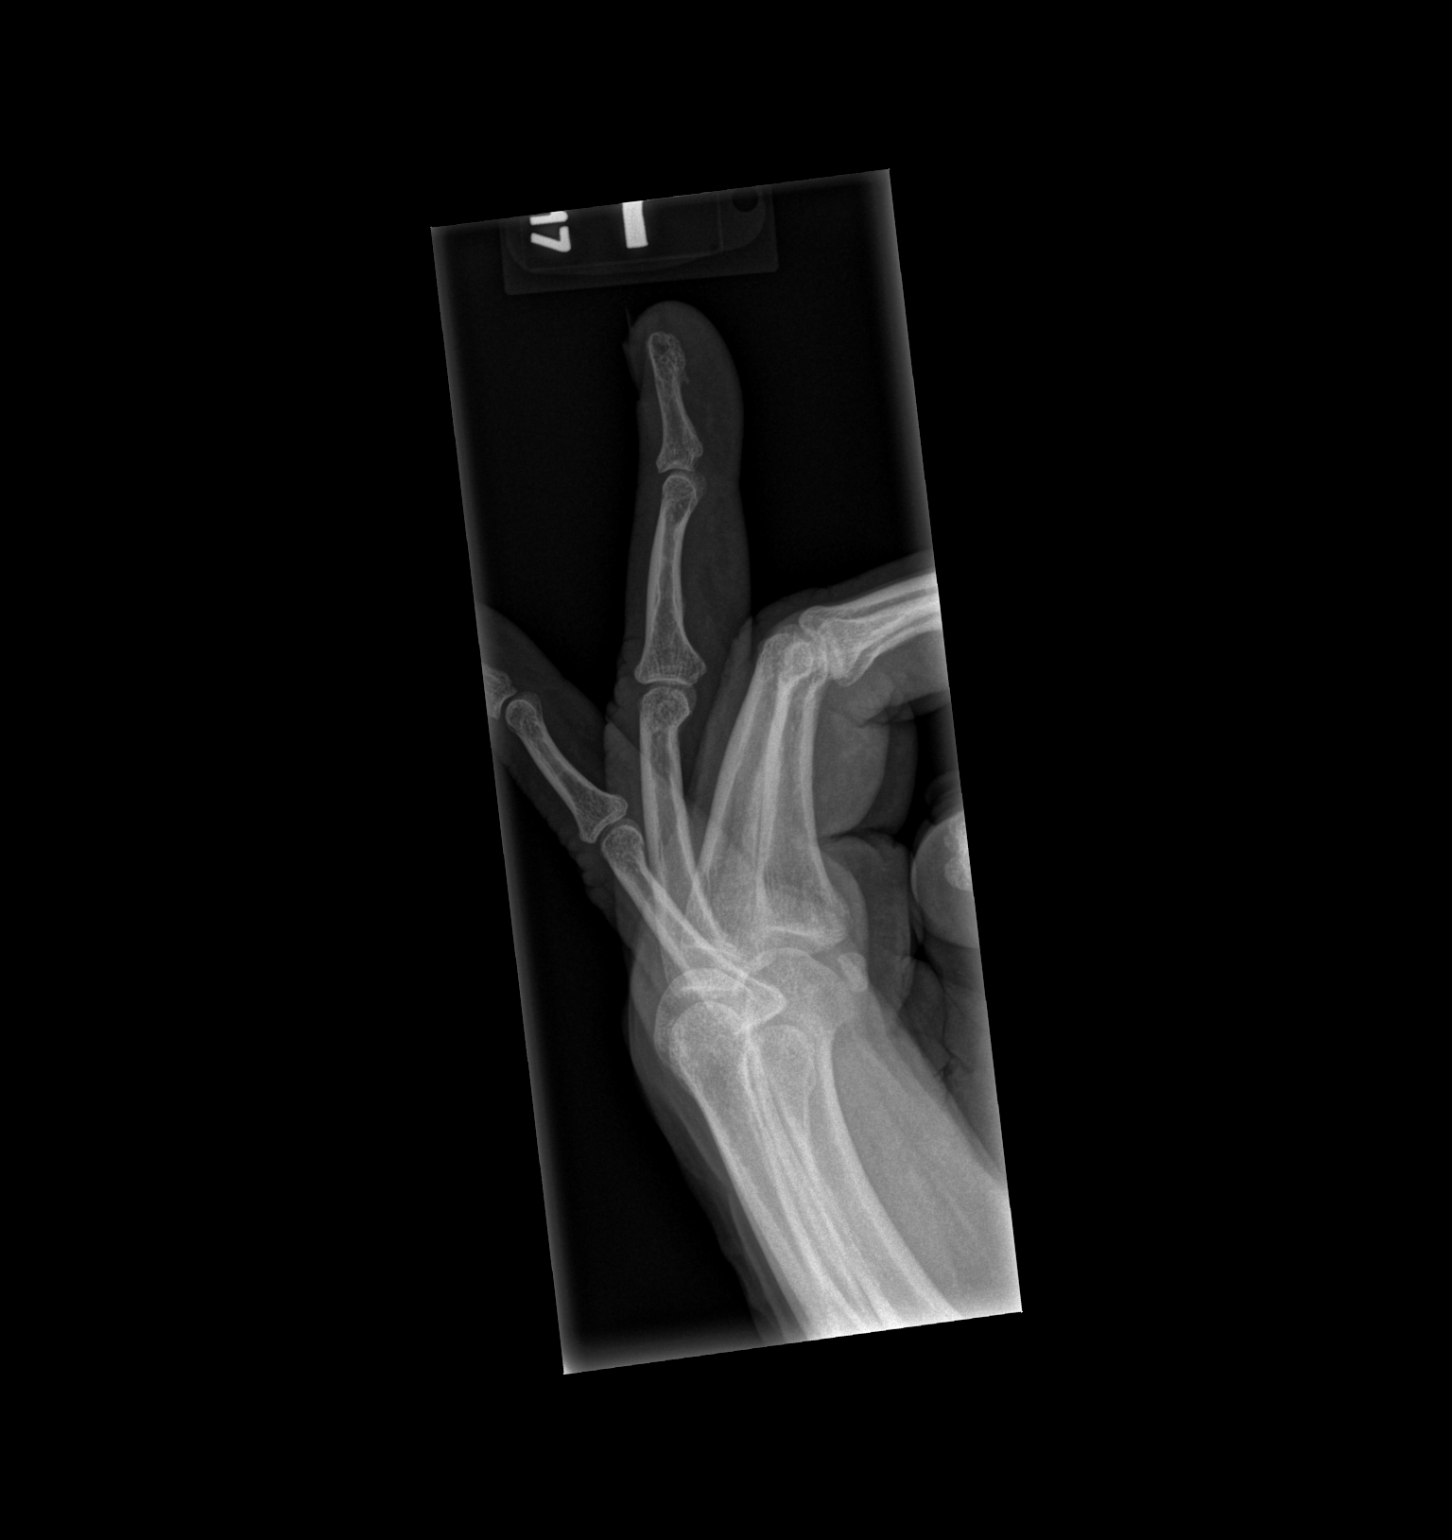

[3 of 3 positions shown; findings below may reference images not displayed]

FINDINGS: There is no evidence of an acute fracture or dislocation. There is
no evidence of arthropathy. A 1 mm area of cortical exostosis is
seen involving the distal phalanx of the third left finger. Soft
tissues are unremarkable.
IMPRESSION: No acute osseous abnormality.

## 2020-07-28 NOTE — ED Triage Notes (Signed)
Patient states she has LOC x 2 today. patient c/o left arm pain and left ring finger pain. Patient does not know if she hit her head or not.

## 2020-10-13 ENCOUNTER — Ambulatory Visit (HOSPITAL_COMMUNITY)
Admission: EM | Admit: 2020-10-13 | Discharge: 2020-10-13 | Disposition: A | Discharge status: Home / Self Care | Attending: Family Medicine | Admitting: Family Medicine

## 2020-10-13 ENCOUNTER — Other Ambulatory Visit: Payer: Self-pay

## 2020-10-13 ENCOUNTER — Encounter (HOSPITAL_COMMUNITY): Payer: Self-pay | Admitting: Emergency Medicine

## 2020-10-13 DIAGNOSIS — L03115 Cellulitis of right lower limb: Secondary | ICD-10-CM

## 2020-10-13 DIAGNOSIS — R059 Cough, unspecified: Secondary | ICD-10-CM | POA: Diagnosis not present

## 2020-10-13 MED ORDER — CETIRIZINE HCL 10 MG PO TABS: 10.0000 mg | ORAL_TABLET | Freq: Every day | ORAL | 0 refills | Status: DC

## 2020-10-13 MED ORDER — GUAIFENESIN 100 MG/5ML PO SYRP: 100.0000 mg | ORAL_SOLUTION | ORAL | 0 refills | Status: DC | PRN

## 2020-10-13 MED ORDER — ALBUTEROL SULFATE HFA 108 (90 BASE) MCG/ACT IN AERS: 1.0000 | INHALATION_SPRAY | Freq: Four times a day (QID) | RESPIRATORY_TRACT | 0 refills | Status: DC | PRN

## 2020-10-13 MED ORDER — DOXYCYCLINE HYCLATE 100 MG PO CAPS: 100.0000 mg | ORAL_CAPSULE | Freq: Two times a day (BID) | ORAL | 0 refills | Status: DC

## 2020-10-13 MED ORDER — FLUTICASONE PROPIONATE 50 MCG/ACT NA SUSP: 1.0000 | Freq: Every day | NASAL | 2 refills | Status: DC

## 2020-10-13 NOTE — Discharge Instructions (Addendum)
Treating you for what is most likely allergy induced asthma/bronchitis.  Flonase and Zyrtec daily for allergies.  Cough medicine as needed.  Inhaler as needed for cough, wheezing or shortness of breath. Doxycycline for right leg cellulitis Follow up as needed for continued or worsening symptoms

## 2020-10-13 NOTE — ED Triage Notes (Addendum)
Patient c/o productive cough w/ "yellowish" sputum production since Monday.  Patient c/o RT leg swelling x 2 weeks.   Patient has tried elevating leg w/ no relief of pain.   Patient has difficulty with ambulation and endorses increased pain with ambulation.   Patient endorses knee pain.   History of Cellulitis after going to nail salon per patient statement.   Cough  Patient denies fever.  Patient endorses SOB.   Patient has used nebulizer at home w/ some relief of symptoms.    History of COPD.

## 2020-10-13 NOTE — ED Provider Notes (Signed)
[facility]    CSN: [id number] Arrival date & time: 10/13/20  0825      History   Chief Complaint Chief Complaint  Patient presents with  . Cough  . Leg Swelling    HPI Denise Knight is a 54 y.o. female.   Patient is a 54 year old female past medical history of allergy, anxiety, arthritis, asthma, bipolar, depression, diabetes, GERD, hypertension, PTSD, schizophrenia, substance abuse, suicide attempt,.  She presents today with productive cough with yellowish sputum production since Monday.  Symptoms have been constant.  Denies any fever.  Mild shortness of breath.  Has been using nebulizer at home with some relief of symptoms.  History of COPD. She also has right leg swelling x2 weeks.  She has tried elevating the leg without any relief of the pain.  Difficulty with ambulation.  And knee pain.  It appears on 10/08/2019 when she was diagnosed with right lower extremity cellulitis and treated with antibiotics x10 days.  Reports she has lost all of her medicine and only get 1 dose of the antibiotics. Also requesting another inhaler.       Past Medical History:  Diagnosis Date  . Allergy    seasonal  . Anxiety   . Arthritis    "left knee" (07/05/2016)  . Asthma   . Bipolar 1 disorder ([place])   . Depression   . Diabetes mellitus without complication ([place])    pre-  . GERD (gastroesophageal reflux disease)   . Hypertension   . MI (mitral incompetence)   . Post traumatic stress disorder   . Schizophrenia ([place])   . Stomach ulcer   . Substance abuse ([place])    cocaine quit july 2020  . Suicide attempt [facility])     Patient Active Problem List   Diagnosis Date Noted  . MDD (major depressive disorder) 01/12/2020  . Severe recurrent major depression without psychotic features ([place]) 01/12/2020  . Alcohol use disorder, moderate, dependence ([place]) 01/12/2020  . MDD (major depressive disorder), recurrent, severe, with psychosis ([place]) 06/01/2019  . Cocaine abuse ([place])  12/27/2017  . Rectal bleeding 12/27/2017  . NSTEMI (non-ST elevated myocardial infarction) ([place]) 12/23/2017  . Acute viral bronchitis 06/14/2017  . Asthma exacerbation 06/14/2017  . Chest pain 07/05/2016    Past Surgical History:  Procedure Laterality Date  . CARDIAC CATHETERIZATION  07/05/2016  . CARDIAC CATHETERIZATION N/A 07/05/2016   Procedure: Left Heart Cath and Coronary Angiography;  Surgeon: Adrian Prows, MD;  Location: [facility];  Service: Cardiovascular;  Laterality: N/A;  . CYST EXCISION Right    "wrist"  . DILATION AND CURETTAGE OF UTERUS    . FOOT SURGERY Bilateral    "corns removed"  . LEFT HEART CATH AND CORONARY ANGIOGRAPHY N/A 12/24/2017   Procedure: LEFT HEART CATH AND CORONARY ANGIOGRAPHY;  Surgeon: Nigel Mormon, MD;  Location: [facility];  Service: Cardiovascular;  Laterality: N/A;  . TUBAL LIGATION      OB History   No obstetric history on file.      Home Medications    Prior to Admission medications   Medication Sig Start Date End Date Taking? Authorizing Provider  albuterol (VENTOLIN HFA) 108 (90 Base) MCG/ACT inhaler Inhale 1-2 puffs into the lungs every 6 (six) hours as needed for wheezing or shortness of breath. 10/13/20   Loura Halt A, NP  cetirizine (ZYRTEC) 10 MG tablet Take 1 tablet (10 mg total) by mouth daily. 10/13/20   Orvan July, NP  doxycycline (VIBRAMYCIN) 100 MG capsule Take 1 capsule (100 mg total) by mouth 2 (two) times daily. 10/13/20   Loura Halt A, NP  FLUoxetine (PROZAC) 20 MG capsule Take 20 mg by mouth daily.    [provider]  fluticasone (FLONASE) 50 MCG/ACT nasal spray Place 1 spray into both nostrils daily. 10/13/20   Loura Halt A, NP  guaiFENesin (MUCINEX) 600 MG 12 hr tablet Take 600 mg by mouth 2 (two) times daily.    [provider]  guaifenesin (ROBITUSSIN) 100 MG/5ML syrup Take 5-10 mLs (100-200 mg total) by mouth every 4 (four) hours as needed for cough. 10/13/20   Loura Halt A, NP   pantoprazole (PROTONIX) 40 MG tablet Take 1 tablet (40 mg total) by mouth daily. 01/18/20   Connye Burkitt, NP  predniSONE (STERAPRED UNI-PAK 21 TAB) 10 MG (21) TBPK tablet Take 6 tabs by mouth daily  for 2 days, then 5 tabs for 2 days, then 4 tabs for 2 days, then 3 tabs for 2 days, 2 tabs for 2 days, then 1 tab by mouth daily for 2 days 02/17/20   Providence Lanius A, PA-C  sertraline (ZOLOFT) 25 MG tablet Take 3 tablets (75 mg total) by mouth daily. 01/18/20   Connye Burkitt, NP  traZODone (DESYREL) 100 MG tablet Take 2 tablets (200 mg total) by mouth at bedtime as needed for sleep. 01/17/20   Connye Burkitt, NP  amantadine (SYMMETREL) 100 MG capsule Take 1 capsule (100 mg total) by mouth 2 (two) times daily. 01/17/20 10/13/20  Connye Burkitt, NP    Family History Family History  Problem Relation Age of Onset  . Breast cancer Other   . Lung cancer Other   . Congestive Heart Failure Other   . Diabetes Other   . Hypertension Other   . Colon cancer Neg Hx   . Colon polyps Neg Hx   . Esophageal cancer Neg Hx   . Stomach cancer Neg Hx   . Rectal cancer Neg Hx     Social History Social History   Tobacco Use  . Smoking status: Current Some Day Smoker    Packs/day: 0.25    Years: 35.00    Pack years: 8.75    Types: Cigarettes    Last attempt to quit: 05/30/2019    Years since quitting: 1.3  . Smokeless tobacco: Never Used  Vaping Use  . Vaping Use: Never used  Substance Use Topics  . Alcohol use: Yes    Alcohol/week: 7.0 standard drinks    Types: 7 Cans of beer per week  . Drug use: Yes    Types: Marijuana     Allergies   Aspirin, Food, and Morphine and related   Review of Systems Review of Systems   Physical Exam Triage Vital Signs ED Triage Vitals [10/13/20 0912]  Enc Vitals Group     BP      Pulse      Resp      Temp      Temp src      SpO2      Weight      Height      Head Circumference      Peak Flow      Pain Score 10     Pain Loc      Pain Edu?      Excl.  in [place]?    No data found.  Updated Vital Signs BP (!) 116/91 (BP Location: Right  Arm)   Pulse 98   Temp 97.9 F (36.6 C) (Oral)   Resp 15   SpO2 98%   Visual Acuity Right Eye Distance:   Left Eye Distance:   Bilateral Distance:    Right Eye Near:   Left Eye Near:    Bilateral Near:     Physical Exam Vitals and nursing note reviewed.  Constitutional:      General: She is not in acute distress.    Appearance: Normal appearance. She is not ill-appearing, toxic-appearing or diaphoretic.  HENT:     Head: Normocephalic.     Right Ear: Tympanic membrane and ear canal normal.     Left Ear: Tympanic membrane and ear canal normal.     Nose: Nose normal.     Mouth/Throat:     Pharynx: Oropharynx is clear.     Comments: PND   Eyes:     Conjunctiva/sclera: Conjunctivae normal.  Cardiovascular:     Rate and Rhythm: Normal rate and regular rhythm.  Pulmonary:     Effort: Pulmonary effort is normal.     Breath sounds: Normal breath sounds.  Musculoskeletal:        General: Normal range of motion.     Cervical back: Normal range of motion.     Right lower leg: Edema present.     Comments: Right lower extremity swelling, erythema from mid calf down into foot.  Mildly tender to palpation generalized.  No calf pain or swelling. 1+ pitting edema  Skin:    General: Skin is warm and dry.     Findings: No rash.  Neurological:     Mental Status: She is alert.  Psychiatric:        Mood and Affect: Mood normal.      UC Treatments / Results  Labs (all labs ordered are listed, but only abnormal results are displayed) Labs Reviewed - No data to display  EKG   Radiology No results found.  Procedures Procedures (including critical care time)  Medications Ordered in UC Medications - No data to display  Initial Impression / Assessment and Plan / UC Course  I have reviewed the triage vital signs and the nursing notes.  Pertinent labs & imaging results that were available  during my care of the patient were reviewed by me and considered in my medical decision making (see chart for details).     Cough Mostly from allergies.  Patient has mild allergy induced asthma. No wheezing or concerns on exam We will prescribe Zyrtec and Flonase for symptoms.  Robitussin as needed and albuterol as needed.  Cellulitis of right leg Patient was started on doxycycline but lost her medication.  She took 1 dose.  We will refill the doxycycline for her to start today. Recommended elevate the leg Follow up as needed for continued or worsening symptoms  Final Clinical Impressions(s) / UC Diagnoses   Final diagnoses:  Cough  Cellulitis of leg, right     Discharge Instructions     Treating you for what is most likely allergy induced asthma/bronchitis.  Flonase and Zyrtec daily for allergies.  Cough medicine as needed.  Inhaler as needed for cough, wheezing or shortness of breath. Doxycycline for right leg cellulitis Follow up as needed for continued or worsening symptoms     ED Prescriptions    Medication Sig Dispense Auth. Provider   guaifenesin (ROBITUSSIN) 100 MG/5ML syrup Take 5-10 mLs (100-200 mg total) by mouth every 4 (four) hours as needed for cough.  60 mL Kalisha Keadle A, NP   fluticasone (FLONASE) 50 MCG/ACT nasal spray Place 1 spray into both nostrils daily. 16 g Kimaria Struthers A, NP   cetirizine (ZYRTEC) 10 MG tablet Take 1 tablet (10 mg total) by mouth daily. 30 tablet Mykeal Carrick A, NP   albuterol (VENTOLIN HFA) 108 (90 Base) MCG/ACT inhaler Inhale 1-2 puffs into the lungs every 6 (six) hours as needed for wheezing or shortness of breath. 1 each Loura Halt A, NP   doxycycline (VIBRAMYCIN) 100 MG capsule Take 1 capsule (100 mg total) by mouth 2 (two) times daily. 20 capsule Loura Halt A, NP     PDMP not reviewed this encounter.   Loura Halt A, NP 10/13/20 1016

## 2020-11-03 ENCOUNTER — Emergency Department (HOSPITAL_BASED_OUTPATIENT_CLINIC_OR_DEPARTMENT_OTHER)
Admission: EM | Admit: 2020-11-03 | Discharge: 2020-11-03 | Disposition: A | Discharge status: Home / Self Care | Attending: Emergency Medicine | Admitting: Emergency Medicine

## 2020-11-03 ENCOUNTER — Encounter (HOSPITAL_BASED_OUTPATIENT_CLINIC_OR_DEPARTMENT_OTHER): Payer: Self-pay | Admitting: Emergency Medicine

## 2020-11-03 ENCOUNTER — Emergency Department (HOSPITAL_BASED_OUTPATIENT_CLINIC_OR_DEPARTMENT_OTHER)

## 2020-11-03 ENCOUNTER — Other Ambulatory Visit: Payer: Self-pay

## 2020-11-03 DIAGNOSIS — E119 Type 2 diabetes mellitus without complications: Secondary | ICD-10-CM | POA: Insufficient documentation

## 2020-11-03 DIAGNOSIS — F17210 Nicotine dependence, cigarettes, uncomplicated: Secondary | ICD-10-CM | POA: Insufficient documentation

## 2020-11-03 DIAGNOSIS — M25561 Pain in right knee: Secondary | ICD-10-CM

## 2020-11-03 DIAGNOSIS — L03115 Cellulitis of right lower limb: Secondary | ICD-10-CM | POA: Diagnosis not present

## 2020-11-03 DIAGNOSIS — M7989 Other specified soft tissue disorders: Secondary | ICD-10-CM | POA: Diagnosis present

## 2020-11-03 DIAGNOSIS — M25461 Effusion, right knee: Secondary | ICD-10-CM | POA: Diagnosis not present

## 2020-11-03 DIAGNOSIS — J45901 Unspecified asthma with (acute) exacerbation: Secondary | ICD-10-CM | POA: Insufficient documentation

## 2020-11-03 DIAGNOSIS — I10 Essential (primary) hypertension: Secondary | ICD-10-CM | POA: Insufficient documentation

## 2020-11-03 MED ORDER — HYDROCODONE-ACETAMINOPHEN 5-325 MG PO TABS
1.0000 | ORAL_TABLET | ORAL | 0 refills | Status: DC | PRN
Start: 2020-11-03 — End: 2021-04-09

## 2020-11-03 MED ORDER — HYDROCODONE-ACETAMINOPHEN 5-325 MG PO TABS
1.0000 | ORAL_TABLET | Freq: Once | ORAL | Status: AC
  Administered 2020-11-03: 1 via ORAL
  Filled 2020-11-03: qty 1

## 2020-11-03 NOTE — ED Provider Notes (Signed)
[facility] EMERGENCY DEPARTMENT Provider Note   CSN: [id number] Arrival date & time: 11/03/20  0319   History Chief Complaint  Patient presents with  . Knee Pain    Denise Knight is a 54 y.o. female.  The history is provided by the patient.  Knee Pain She has history of hypertension, prediabetes and comes in with pain and swelling of her right knee which has been present for the last month.  She had been diagnosed with cellulitis of her right lower leg and received a course of cephalexin for 10 days.  Following that, her leg redness improved although she has had itching in her legs since then.  Her knee continues to be swollen and very painful.  Is painful to walk on it.  She rates pain at 10/10.  She has taken acetaminophen and applied topical lidocaine patches with little to no relief.  She is allergic to aspirin and cannot take NSAIDs.  She does not recall any trauma.  She denies any fever or chills.  Past Medical History:  Diagnosis Date  . Allergy    seasonal  . Anxiety   . Arthritis    "left knee" (07/05/2016)  . Asthma   . Bipolar 1 disorder ([place])   . Depression   . Diabetes mellitus without complication ([place])    pre-  . GERD (gastroesophageal reflux disease)   . Hypertension   . MI (mitral incompetence)   . Post traumatic stress disorder   . Schizophrenia ([place])   . Stomach ulcer   . Substance abuse ([place])    cocaine quit july 2020  . Suicide attempt [facility])     Patient Active Problem List   Diagnosis Date Noted  . MDD (major depressive disorder) 01/12/2020  . Severe recurrent major depression without psychotic features ([place]) 01/12/2020  . Alcohol use disorder, moderate, dependence ([place]) 01/12/2020  . MDD (major depressive disorder), recurrent, severe, with psychosis ([place]) 06/01/2019  . Cocaine abuse ([place]) 12/27/2017  . Rectal bleeding 12/27/2017  . NSTEMI (non-ST elevated myocardial infarction) ([place]) 12/23/2017  . Acute viral bronchitis  06/14/2017  . Asthma exacerbation 06/14/2017  . Chest pain 07/05/2016    Past Surgical History:  Procedure Laterality Date  . CARDIAC CATHETERIZATION  07/05/2016  . CARDIAC CATHETERIZATION N/A 07/05/2016   Procedure: Left Heart Cath and Coronary Angiography;  Surgeon: Adrian Prows, MD;  Location: [place] CV LAB;  Service: Cardiovascular;  Laterality: N/A;  . CYST EXCISION Right    "wrist"  . DILATION AND CURETTAGE OF UTERUS    . FOOT SURGERY Bilateral    "corns removed"  . LEFT HEART CATH AND CORONARY ANGIOGRAPHY N/A 12/24/2017   Procedure: LEFT HEART CATH AND CORONARY ANGIOGRAPHY;  Surgeon: Nigel Mormon, MD;  Location: [facility] CV LAB;  Service: Cardiovascular;  Laterality: N/A;  . TUBAL LIGATION       OB History   No obstetric history on file.     Family History  Problem Relation Age of Onset  . Breast cancer Other   . Lung cancer Other   . Congestive Heart Failure Other   . Diabetes Other   . Hypertension Other   . Colon cancer Neg Hx   . Colon polyps Neg Hx   . Esophageal cancer Neg Hx   . Stomach cancer Neg Hx   . Rectal cancer Neg Hx     Social History   Tobacco Use  . Smoking status: Current Some Day Smoker    Packs/day:  0.25    Years: 35.00    Pack years: 8.75    Types: Cigarettes    Last attempt to quit: 05/30/2019    Years since quitting: 1.4  . Smokeless tobacco: Never Used  Vaping Use  . Vaping Use: Never used  Substance Use Topics  . Alcohol use: Yes    Alcohol/week: 7.0 standard drinks    Types: 7 Cans of beer per week  . Drug use: Yes    Types: Marijuana    Home Medications Prior to Admission medications   Medication Sig Start Date End Date Taking? Authorizing Provider  albuterol (VENTOLIN HFA) 108 (90 Base) MCG/ACT inhaler Inhale 1-2 puffs into the lungs every 6 (six) hours as needed for wheezing or shortness of breath. 10/13/20   Loura Halt A, NP  cetirizine (ZYRTEC) 10 MG tablet Take 1 tablet (10 mg total) by mouth daily.  10/13/20   Loura Halt A, NP  doxycycline (VIBRAMYCIN) 100 MG capsule Take 1 capsule (100 mg total) by mouth 2 (two) times daily. 10/13/20   Loura Halt A, NP  FLUoxetine (PROZAC) 20 MG capsule Take 20 mg by mouth daily.    [provider]  fluticasone (FLONASE) 50 MCG/ACT nasal spray Place 1 spray into both nostrils daily. 10/13/20   Loura Halt A, NP  guaiFENesin (MUCINEX) 600 MG 12 hr tablet Take 600 mg by mouth 2 (two) times daily.    [provider]  guaifenesin (ROBITUSSIN) 100 MG/5ML syrup Take 5-10 mLs (100-200 mg total) by mouth every 4 (four) hours as needed for cough. 10/13/20   Loura Halt A, NP  pantoprazole (PROTONIX) 40 MG tablet Take 1 tablet (40 mg total) by mouth daily. 01/18/20   Connye Burkitt, NP  prazosin (MINIPRESS) 1 MG capsule Take 1 mg by mouth at bedtime. 10/14/20   [provider]  predniSONE (STERAPRED UNI-PAK 21 TAB) 10 MG (21) TBPK tablet Take 6 tabs by mouth daily  for 2 days, then 5 tabs for 2 days, then 4 tabs for 2 days, then 3 tabs for 2 days, 2 tabs for 2 days, then 1 tab by mouth daily for 2 days 02/17/20   Providence Lanius A, PA-C  sertraline (ZOLOFT) 25 MG tablet Take 3 tablets (75 mg total) by mouth daily. 01/18/20   Connye Burkitt, NP  traZODone (DESYREL) 100 MG tablet Take 2 tablets (200 mg total) by mouth at bedtime as needed for sleep. 01/17/20   Connye Burkitt, NP  amantadine (SYMMETREL) 100 MG capsule Take 1 capsule (100 mg total) by mouth 2 (two) times daily. 01/17/20 10/13/20  Connye Burkitt, NP    Allergies    Aspirin, Food, and Morphine and related  Review of Systems   Review of Systems  All other systems reviewed and are negative.   Physical Exam Updated Vital Signs BP 107/79 (BP Location: Left Arm)   Pulse 98   Temp 97.7 F (36.5 C) (Oral)   Resp 18   Ht 5' 8.5" (1.74 m)   Wt 73.9 kg   SpO2 95%   BMI 24.41 kg/m   Physical Exam Vitals and nursing note reviewed.   55 year old female, resting comfortably and in no acute  distress. Vital signs are normal. Oxygen saturation is 95%, which is normal. Head is normocephalic and atraumatic. PERRLA, EOMI. Oropharynx is clear. Neck is nontender and supple without adenopathy or JVD. Back is nontender and there is no CVA tenderness. Lungs are clear without rales, wheezes, or  rhonchi. Chest is nontender. Heart has regular rate and rhythm without murmur. Abdomen is soft, flat, nontender without masses or hepatosplenomegaly and peristalsis is normoactive. Extremities: There is a moderate size effusion present in the right knee.  There is no instability on valgus or varus stress.  Lachman test is negative.  There is pain with passive flexion, but McMurray's test is negative. Skin is warm and dry without rash. Neurologic: Mental status is normal, cranial nerves are intact, there are no motor or sensory deficits.  ED Results / Procedures / Treatments    Radiology DG Knee Complete 4 Views Right  Result Date: 11/03/2020 CLINICAL DATA:  Knee pain and swelling.  Redness. EXAM: RIGHT KNEE - COMPLETE 4+ VIEW COMPARISON:  None. FINDINGS: Knee joint effusion. No fracture, erosion, or subluxation. No soft tissue emphysema. IMPRESSION: Knee joint effusion without underlying osseous abnormality. Electronically Signed   By: Monte Fantasia M.D.   On: 11/03/2020 05:47    Procedures Procedures   Medications Ordered in ED Medications  HYDROcodone-acetaminophen (NORCO/VICODIN) 5-325 MG per tablet 1 tablet (1 tablet Oral Given 11/03/20 0535)    ED Course  I have reviewed the triage vital signs and the nursing notes.  Pertinent imaging results that were available during my care of the patient were reviewed by me and considered in my medical decision making (see chart for details).  MDM Rules/Calculators/A&P Right knee effusion and pain.  Old records are reviewed confirming ED visit for cellulitis of the right lower leg on 11/27, treated with 10-day course of cephalexin.  No mention  of knee swelling or effusion on the ED note.  Also, she had an MRI of her left knee in 2017 which showed a meniscus tear.  ED note from 11/27 states that she had started a problem with that leg after stubbing her toe and its possible that she may have injured her knee in the same event.  We will start with a knee x-ray.  X-ray shows presence of effusion, but no bony injury.  She is placed in a knee immobilizer and given crutches and discharged with prescription for a small number of hydrocodone-acetaminophen tablets.  She is referred to orthopedics for further outpatient evaluation.  Final Clinical Impression(s) / ED Diagnoses Final diagnoses:  Right knee pain, unspecified chronicity  Effusion of right knee    Rx / DC Orders ED Discharge Orders         Ordered    HYDROcodone-acetaminophen (NORCO) 5-325 MG tablet  Every 4 hours PRN        11/03/20 1610           Delora Fuel, MD 96/04/54 [phone]

## 2020-11-03 NOTE — Discharge Instructions (Signed)
Apply ice for 30 minutes at a time, 4 times a day.  Wear the immobilizer as needed.  Use crutches as needed.

## 2020-11-03 NOTE — ED Triage Notes (Signed)
Patient presents with complaints of right knee pain and right lower extremity pain, swelling, redness. Patient states completed antibiotics for cellulitis in same extremity recently.

## 2020-12-05 ENCOUNTER — Other Ambulatory Visit: Payer: Self-pay

## 2020-12-05 ENCOUNTER — Ambulatory Visit: Attending: Orthopedic Surgery

## 2020-12-05 DIAGNOSIS — M6281 Muscle weakness (generalized): Secondary | ICD-10-CM

## 2020-12-05 DIAGNOSIS — M25561 Pain in right knee: Secondary | ICD-10-CM

## 2020-12-05 DIAGNOSIS — R262 Difficulty in walking, not elsewhere classified: Secondary | ICD-10-CM

## 2020-12-05 DIAGNOSIS — M25661 Stiffness of right knee, not elsewhere classified: Secondary | ICD-10-CM

## 2020-12-05 NOTE — Therapy (Signed)
[facility] [place], [place], [address] Phone: [phone]   Fax:  [phone]  Physical Therapy Evaluation  Patient Details  Name: Denise Knight MRN: [id number] Date of Birth: 03-Jan-1966 Referring Provider (PT): Earlie Server, MD   Encounter Date: 12/05/2020   PT End of Session - 12/05/20 1347    Visit Number 1    Number of Visits 17    Date for PT Re-Evaluation 02/03/21    PT Start Time 4315    PT Stop Time 1408    PT Time Calculation (min) 35 min    Activity Tolerance Patient tolerated treatment well    Behavior During Therapy [facility] for tasks assessed/performed           Past Medical History:  Diagnosis Date  . Allergy    seasonal  . Anxiety   . Arthritis    "left knee" (07/05/2016)  . Asthma   . Bipolar 1 disorder ([place])   . Depression   . Diabetes mellitus without complication ([place])    pre-  . GERD (gastroesophageal reflux disease)   . Hypertension   . MI (mitral incompetence)   . Post traumatic stress disorder   . Schizophrenia ([place])   . Stomach ulcer   . Substance abuse ([place])    cocaine quit july 2020  . Suicide attempt [facility])     Past Surgical History:  Procedure Laterality Date  . CARDIAC CATHETERIZATION  07/05/2016  . CARDIAC CATHETERIZATION N/A 07/05/2016   Procedure: Left Heart Cath and Coronary Angiography;  Surgeon: Adrian Prows, MD;  Location: [facility] CV LAB;  Service: Cardiovascular;  Laterality: N/A;  . CYST EXCISION Right    "wrist"  . DILATION AND CURETTAGE OF UTERUS    . FOOT SURGERY Bilateral    "corns removed"  . LEFT HEART CATH AND CORONARY ANGIOGRAPHY N/A 12/24/2017   Procedure: LEFT HEART CATH AND CORONARY ANGIOGRAPHY;  Surgeon: Nigel Mormon, MD;  Location: [facility] CV LAB;  Service: Cardiovascular;  Laterality: N/A;  . TUBAL LIGATION      There were no vitals filed for this visit.    Subjective Assessment - 12/05/20 1334    Subjective Patient  is  1 week s/p Rt knee arthroscopy medial meniscectomy and chondroplasty and has been doing pretty well up until her granddaughter bumped her knee yesterday and now it's back to aching a little more. She reports itching around her incision sites and has f/u with surgeon on 12/07/20 for suture removal. She has had fluid removed from the knee 3 times within the past year that per patient was due to RA. Patient is using crutches PRN, but reports the most difficulty right now with transfering from sit to stand.    Pertinent History depression. non-ST elevated myocardial infarction    Limitations Standing;Walking;House hold activities;Lifting;Sitting    How long can you sit comfortably? couple hours    How long can you stand comfortably? "haven't really tried standing for a period of time yet." "i have a lot of difficulty getting up from sitting."    How long can you walk comfortably? " I walked to the mailbox the other day it was a little painful."    Diagnostic tests X-ray IMPRESSION:  Knee joint effusion without underlying osseous abnormality.    Patient Stated Goals "Get back to normal."    Currently in Pain? Yes    Pain Score 8     Pain Location Knee    Pain Orientation  Right    Pain Descriptors / Indicators Aching;Dull    Pain Type Surgical pain    Pain Onset In the past 7 days    Pain Frequency Intermittent    Aggravating Factors  prolonged positioning, standing, walking, ice    Pain Relieving Factors pain medication    Effect of Pain on Daily Activities limited ability to perform standing and walking activity.    Multiple Pain Sites No              [facility] PT Assessment - 12/05/20 0001      Assessment   Medical Diagnosis S/P right knee scope medical meniscectomy chondroplasty 11/29/2020    Referring Provider (PT) Earlie Server, MD    Onset Date/Surgical Date 11/29/20    Next MD Visit 12/07/20    Prior Therapy n/a      Precautions   Precautions None      Restrictions   Weight  Bearing Restrictions No      Balance Screen   Has the patient fallen in the past 6 months No      [place] residence    Living Arrangements Children    Additional Comments 1 step to enter      Prior Function   Level of Independence Independent   axillary crutches PRN   Vocation Unemployed      Cognition   Overall Cognitive Status Within Functional Limits for tasks assessed      Observation/Other Assessments   Observations incision sites healing well, no signs of infection, sits with Rt knee extended    Focus on Therapeutic Outcomes (FOTO)  31% function, 53% predicted      Sensation   Light Touch Appears Intact      Coordination   Gross Motor Movements are Fluid and Coordinated Yes      AROM   Overall AROM Comments pain Rt knee AROM    Right Knee Extension --   lacking 2 degrees   Right Knee Flexion 60    Left Knee Extension --   5 hyperextension   Left Knee Flexion 138      Strength   Overall Strength Comments MMT deferred secondary to post-op acuity      Palpation   Palpation comment diffuse tenderness about anterior knee      Ambulation/Gait   Gait Comments foot flat initial contact RLE, decreased stance time RLE, limited knee flexion during stance/swing on RLE.                      Objective measurements completed on examination: See above findings.       [place] Adult PT Treatment/Exercise - 12/05/20 0001      Self-Care   Self-Care Other Self-Care Comments    Other Self-Care Comments  see patient education                  PT Education - 12/05/20 1332    Education Details education on current condition, POC, HEP, FOTO results and expected outcomes, modalities for pain control, instructed to discontinue use of pillow under the knee    Person(s) Educated Patient    Methods Explanation;Demonstration;Handout;Verbal cues;Tactile cues    Comprehension Verbalized understanding;Returned  demonstration;Verbal cues required;Tactile cues required            PT Short Term Goals - 12/05/20 1350      PT SHORT TERM GOAL #1   Title Patient will be independent and compliant with  established HEP.    Baseline issued at eval.    Time 3    Period Weeks    Status New    Target Date 12/26/20      PT SHORT TERM GOAL #2   Title Patient will demonstrate at least 100 degrees of knee flexion AROM to improve ability to complete sit<>stand transfer    Baseline see flowsheet    Time 4    Period Weeks    Status New    Target Date 01/02/21      PT SHORT TERM GOAL #3   Title Patient will demonstrate full knee extension AROM to improve gait mechanics.    Baseline see flowsheet    Time 4    Period Weeks    Status New    Target Date 01/02/21      PT SHORT TERM GOAL #4   Title Patient will perform SLR without quad lag.    Baseline poor VMO activation with quad set    Time 4    Period Weeks    Status New    Target Date 01/02/21             PT Long Term Goals - 12/05/20 1351      PT LONG TERM GOAL #1   Title Patient will demonstrate normalized gait mechanics without use of AD.    Baseline see gait assessment. axillary crutches PRN.    Time 8    Period Weeks    Status New    Target Date 01/30/21      PT LONG TERM GOAL #2   Title Patient will squat with proper body mechanics without reports of pain.    Baseline not assessed due to ROM limitations/post-op acuity    Time 8    Period Weeks    Status New    Target Date 01/30/21      PT LONG TERM GOAL #3   Title Patient will demonstrate full and pain free Rt knee AROM to improve ability to complete squatting and bending activity.    Baseline See flowsheet    Time 8    Period Weeks    Status New    Target Date 01/30/21      PT LONG TERM GOAL #4   Title Patient will demonstrate at least 4+/5 strength in Rt knee to improve stability necessary for prolonged standing and walking.    Baseline not assessed due to post-op  acuity    Time 8    Period Weeks    Status New    Target Date 01/30/21      PT LONG TERM GOAL #5   Title Patient will score at least 53% function on FOTO to signify clinically meaningful improvement in functional abilities.    Baseline 31% function    Time 8    Period Weeks    Status New    Target Date 01/30/21                  Plan - 12/05/20 1414    Clinical Impression Statement Patient is a 55 y/o female who presents to OPPT s/p Rt knee medial menisectomy and chondroplasty on 11/29/20. She currently presents with limited and painful knee AROM, gait abnormalities, balance deficits, strength deficits, and difficulty with transfers that are consistent with her recent post-op status. She will benefit from skilled PT to address above stated deficits in order to return to optimal function.    Examination-Activity Limitations Bed Mobility;Bend;Caring for Others;Hygiene/Grooming;Lift;Locomotion Level;Sit;Squat;Stairs;Stand;Toileting;Transfers  Examination-Participation Restrictions Cleaning;Laundry;Other   playing with grandchildren   Stability/Clinical Decision Making Stable/Uncomplicated    Clinical Decision Making Low    Rehab Potential Good    PT Frequency 2x / week    PT Duration --   6-8 weeks   PT Treatment/Interventions ADLs/Self Care Home Management;Cryotherapy;Dentist;Therapeutic activities;Therapeutic exercise;Balance training;Neuromuscular re-education;Patient/family education;Manual techniques;Passive range of motion;Dry needling;Vasopneumatic Device    PT Next Visit Plan review HEP. PROM to tolerance. gait training, hip strengthening. knee extension/flexion AAROM    PT Home Exercise Plan Access Code: [email or web address]    Consulted and Agree with Plan of Care Patient           Patient will benefit from skilled therapeutic intervention in order to improve the following deficits and impairments:  Abnormal gait,Decreased  range of motion,Difficulty walking,Decreased activity tolerance,Pain,Decreased balance,Decreased mobility,Decreased strength  Visit Diagnosis: Acute pain of right knee  Stiffness of right knee, not elsewhere classified  Muscle weakness (generalized)  Difficulty in walking, not elsewhere classified     Problem List Patient Active Problem List   Diagnosis Date Noted  . MDD (major depressive disorder) 01/12/2020  . Severe recurrent major depression without psychotic features ([place]) 01/12/2020  . Alcohol use disorder, moderate, dependence ([place]) 01/12/2020  . MDD (major depressive disorder), recurrent, severe, with psychosis ([place]) 06/01/2019  . Cocaine abuse ([place]) 12/27/2017  . Rectal bleeding 12/27/2017  . NSTEMI (non-ST elevated myocardial infarction) ([place]) 12/23/2017  . Acute viral bronchitis 06/14/2017  . Asthma exacerbation 06/14/2017  . Chest pain 07/05/2016   Gwendolyn Grant, PT, DPT, ATC 12/05/20 2:50 PM [facility] [address] [place], [place], [address] Phone: [phone]   Fax:  [phone]  Name: Antanette Richwine MRN: [id number] Date of Birth: Mar 23, 1966

## 2020-12-12 ENCOUNTER — Ambulatory Visit: Attending: Orthopedic Surgery

## 2020-12-12 ENCOUNTER — Telehealth: Payer: Self-pay

## 2020-12-12 DIAGNOSIS — M25661 Stiffness of right knee, not elsewhere classified: Secondary | ICD-10-CM | POA: Insufficient documentation

## 2020-12-12 DIAGNOSIS — M25561 Pain in right knee: Secondary | ICD-10-CM | POA: Insufficient documentation

## 2020-12-12 DIAGNOSIS — M6281 Muscle weakness (generalized): Secondary | ICD-10-CM | POA: Insufficient documentation

## 2020-12-12 DIAGNOSIS — R262 Difficulty in walking, not elsewhere classified: Secondary | ICD-10-CM | POA: Insufficient documentation

## 2020-12-12 NOTE — Telephone Encounter (Signed)
PT called and spoke with patient regarding no show and attendance policy. Pt reports she did not make it because she is feeling unwell. She states she has not been tested but denies Covid-19 exposure and reports that it is her allergies acting up. Asked that patient call to cancel if she is unable to make future appointments and informed patient that there is a return protocol if she does happen to be sick/have Covid-19 or exposure/symptoms. Confirmed next appointment with patient.  Haydee Monica, PT, DPT 12/12/20 4:40 PM

## 2020-12-14 ENCOUNTER — Ambulatory Visit

## 2020-12-19 ENCOUNTER — Ambulatory Visit

## 2020-12-19 ENCOUNTER — Other Ambulatory Visit: Payer: Self-pay

## 2020-12-19 DIAGNOSIS — M25661 Stiffness of right knee, not elsewhere classified: Secondary | ICD-10-CM | POA: Diagnosis present

## 2020-12-19 DIAGNOSIS — M6281 Muscle weakness (generalized): Secondary | ICD-10-CM | POA: Diagnosis present

## 2020-12-19 DIAGNOSIS — R262 Difficulty in walking, not elsewhere classified: Secondary | ICD-10-CM | POA: Diagnosis present

## 2020-12-19 DIAGNOSIS — M25561 Pain in right knee: Secondary | ICD-10-CM | POA: Diagnosis not present

## 2020-12-19 NOTE — Therapy (Signed)
[place], [place], [address] Phone: [phone]   Fax:  [phone]  Physical Therapy Treatment  Patient Details  Name: Denise Knight MRN: [id number] Date of Birth: 1965/11/23 Referring Provider (PT): Earlie Server, MD   Encounter Date: 12/19/2020   PT End of Session - 12/19/20 1738    Visit Number 2    Number of Visits 17    Date for PT Re-Evaluation 02/03/21    PT Start Time 3664   patient late   PT Stop Time 1810    PT Time Calculation (min) 48 min    Activity Tolerance Patient tolerated treatment well    Behavior During Therapy [facility] for tasks assessed/performed           Past Medical History:  Diagnosis Date  . Allergy    seasonal  . Anxiety   . Arthritis    "left knee" (07/05/2016)  . Asthma   . Bipolar 1 disorder ([place])   . Depression   . Diabetes mellitus without complication ([place])    pre-  . GERD (gastroesophageal reflux disease)   . Hypertension   . MI (mitral incompetence)   . Post traumatic stress disorder   . Schizophrenia ([place])   . Stomach ulcer   . Substance abuse ([place])    cocaine quit july 2020  . Suicide attempt [facility])     Past Surgical History:  Procedure Laterality Date  . CARDIAC CATHETERIZATION  07/05/2016  . CARDIAC CATHETERIZATION N/A 07/05/2016   Procedure: Left Heart Cath and Coronary Angiography;  Surgeon: Adrian Prows, MD;  Location: [facility];  Service: Cardiovascular;  Laterality: N/A;  . CYST EXCISION Right    "wrist"  . DILATION AND CURETTAGE OF UTERUS    . FOOT SURGERY Bilateral    "corns removed"  . LEFT HEART CATH AND CORONARY ANGIOGRAPHY N/A 12/24/2017   Procedure: LEFT HEART CATH AND CORONARY ANGIOGRAPHY;  Surgeon: Nigel Mormon, MD;  Location: [facility];  Service: Cardiovascular;  Laterality: N/A;  . TUBAL LIGATION      There were no vitals filed for this visit.   Subjective Assessment - 12/19/20 1724    Subjective  Patient reports soreness along anterior knee with occasional sharp pains. She had stitches removed since last session and reports compliance with HEP.    Pertinent History depression. non-ST elevated myocardial infarction    Limitations Standing;Walking;House hold activities;Lifting;Sitting    How long can you sit comfortably? couple hours    How long can you stand comfortably? "haven't really tried standing for a period of time yet." "i have a lot of difficulty getting up from sitting."    How long can you walk comfortably? " I walked to the mailbox the other day it was a little painful."    Diagnostic tests X-ray IMPRESSION:  Knee joint effusion without underlying osseous abnormality.    Patient Stated Goals "Get back to normal."    Currently in Pain? Yes    Pain Score 8     Pain Location Knee    Pain Orientation Right    Pain Descriptors / Indicators Sore    Pain Type Surgical pain    Pain Onset 1 to 4 weeks ago              [facility] PT Assessment - 12/19/20 0001      AROM   Right Knee Extension --   lacking 3   Right Knee Flexion 125  [facility] Adult PT Treatment/Exercise - 12/19/20 0001      Knee/Hip Exercises: Stretches   Hip Flexor Stretch 60 seconds      Knee/Hip Exercises: Standing   Heel Raises 2 sets;10 reps    Gait Training focusing on heel strike and weigh shift to RLE      Knee/Hip Exercises: Supine   Quad Sets 2 sets;10 reps    Short Arc Quad Sets 2 sets;10 reps    Bridges 2 sets;10 reps    Bridges Limitations partial range      Modalities   Modalities Cryotherapy      Cryotherapy   Number Minutes Cryotherapy 10 Minutes    Cryotherapy Location Knee    Type of Cryotherapy Ice pack      Manual Therapy   Joint Mobilization patellar mobilizations all planes grade II-III Rt    Soft tissue mobilization scar tissue Rt knee    Passive ROM knee flexion/extension Rt                    PT Short Term Goals -  12/19/20 1742      PT SHORT TERM GOAL #1   Title Patient will be independent and compliant with established HEP.    Baseline issued at eval.    Time 3    Period Weeks    Status On-going    Target Date 12/26/20      PT SHORT TERM GOAL #2   Title Patient will demonstrate at least 100 degrees of knee flexion AROM to improve ability to complete sit<>stand transfer    Baseline see flowsheet    Time 4    Period Weeks    Status Achieved    Target Date 01/02/21      PT SHORT TERM GOAL #3   Title Patient will demonstrate full knee extension AROM to improve gait mechanics.    Baseline see flowsheet    Time 4    Period Weeks    Status On-going    Target Date 01/02/21      PT SHORT TERM GOAL #4   Title Patient will perform SLR without quad lag.    Baseline poor VMO activation with quad set    Time 4    Period Weeks    Status Deferred    Target Date 01/02/21             PT Long Term Goals - 12/05/20 1351      PT LONG TERM GOAL #1   Title Patient will demonstrate normalized gait mechanics without use of AD.    Baseline see gait assessment. axillary crutches PRN.    Time 8    Period Weeks    Status New    Target Date 01/30/21      PT LONG TERM GOAL #2   Title Patient will squat with proper body mechanics without reports of pain.    Baseline not assessed due to ROM limitations/post-op acuity    Time 8    Period Weeks    Status New    Target Date 01/30/21      PT LONG TERM GOAL #3   Title Patient will demonstrate full and pain free Rt knee AROM to improve ability to complete squatting and bending activity.    Baseline See flowsheet    Time 8    Period Weeks    Status New    Target Date 01/30/21      PT LONG TERM GOAL #4  Title Patient will demonstrate at least 4+/5 strength in Rt knee to improve stability necessary for prolonged standing and walking.    Baseline not assessed due to post-op acuity    Time 8    Period Weeks    Status New    Target Date 01/30/21       PT LONG TERM GOAL #5   Title Patient will score at least 53% function on FOTO to signify clinically meaningful improvement in functional abilities.    Baseline 31% function    Time 8    Period Weeks    Status New    Target Date 01/30/21                 Plan - 12/19/20 1739    Clinical Impression Statement Patient's Rt knee flexion AROM has much improved since initial evaluation achieving 125 degrees at today's session. Patient still lacking full knee extension requiring continued emphasis on restoring ROM at future sessions. Patient demonstrates good VMO activation with quad sets and SAQ, though quickly fatigues. Patient demonstrates foot flat initial contact and excessive lateral trunk lean during stance on RLE with mild improvements in gait following gait training which focused on heel strike and weight shift to the RLE.    Examination-Activity Limitations Bed Mobility;Bend;Caring for Others;Hygiene/Grooming;Lift;Locomotion Level;Sit;Squat;Stairs;Stand;Toileting;Transfers    Examination-Participation Restrictions Cleaning;Laundry;Other   playing with grandchildren   Stability/Clinical Decision Making Stable/Uncomplicated    Rehab Potential Good    PT Frequency 2x / week    PT Duration --   6-8 weeks   PT Treatment/Interventions ADLs/Self Care Home Management;Cryotherapy;Dentist;Therapeutic activities;Therapeutic exercise;Balance training;Neuromuscular re-education;Patient/family education;Manual techniques;Passive range of motion;Dry needling;Vasopneumatic Device    PT Next Visit Plan PROM to tolerance. gait training, hip strengthening. knee extension/flexion ROM and strengthening    PT Home Exercise Plan Access Code: [email or web address]    Consulted and Agree with Plan of Care Patient           Patient will benefit from skilled therapeutic intervention in order to improve the following deficits and impairments:  Abnormal  gait,Decreased range of motion,Difficulty walking,Decreased activity tolerance,Pain,Decreased balance,Decreased mobility,Decreased strength  Visit Diagnosis: Acute pain of right knee  Stiffness of right knee, not elsewhere classified  Muscle weakness (generalized)  Difficulty in walking, not elsewhere classified     Problem List Patient Active Problem List   Diagnosis Date Noted  . MDD (major depressive disorder) 01/12/2020  . Severe recurrent major depression without psychotic features ([place]) 01/12/2020  . Alcohol use disorder, moderate, dependence ([place]) 01/12/2020  . MDD (major depressive disorder), recurrent, severe, with psychosis ([place]) 06/01/2019  . Cocaine abuse ([place]) 12/27/2017  . Rectal bleeding 12/27/2017  . NSTEMI (non-ST elevated myocardial infarction) ([place]) 12/23/2017  . Acute viral bronchitis 06/14/2017  . Asthma exacerbation 06/14/2017  . Chest pain 07/05/2016   Gwendolyn Grant, PT, DPT, ATC 12/19/20 6:02 PM [facility] [address] [place], [place], [address] Phone: [phone]   Fax:  [phone]  Name: Denise Knight MRN: [id number] Date of Birth: 15-Sep-1966

## 2020-12-21 ENCOUNTER — Ambulatory Visit

## 2020-12-21 ENCOUNTER — Other Ambulatory Visit: Payer: Self-pay

## 2020-12-21 DIAGNOSIS — R262 Difficulty in walking, not elsewhere classified: Secondary | ICD-10-CM

## 2020-12-21 DIAGNOSIS — M6281 Muscle weakness (generalized): Secondary | ICD-10-CM

## 2020-12-21 DIAGNOSIS — M25561 Pain in right knee: Secondary | ICD-10-CM | POA: Diagnosis not present

## 2020-12-21 DIAGNOSIS — M25661 Stiffness of right knee, not elsewhere classified: Secondary | ICD-10-CM

## 2020-12-21 NOTE — Therapy (Addendum)
[place], [place], [address] Phone: [phone]   Fax:  [phone]  Physical Therapy Treatment/Discharge   Patient Details  Name: Denise Knight MRN: [id number] Date of Birth: Jul 05, 1966 Referring Provider (PT): Earlie Server, MD   Encounter Date: 12/21/2020   PT End of Session - 12/21/20 1901    Visit Number 3    Number of Visits 17    Date for PT Re-Evaluation 02/03/21    PT Start Time 1846    PT Stop Time 1926    PT Time Calculation (min) 40 min    Activity Tolerance Patient tolerated treatment well    Behavior During Therapy [facility] for tasks assessed/performed           Past Medical History:  Diagnosis Date  . Allergy    seasonal  . Anxiety   . Arthritis    "left knee" (07/05/2016)  . Asthma   . Bipolar 1 disorder ([place])   . Depression   . Diabetes mellitus without complication ([place])    pre-  . GERD (gastroesophageal reflux disease)   . Hypertension   . MI (mitral incompetence)   . Post traumatic stress disorder   . Schizophrenia ([place])   . Stomach ulcer   . Substance abuse ([place])    cocaine quit july 2020  . Suicide attempt [facility])     Past Surgical History:  Procedure Laterality Date  . CARDIAC CATHETERIZATION  07/05/2016  . CARDIAC CATHETERIZATION N/A 07/05/2016   Procedure: Left Heart Cath and Coronary Angiography;  Surgeon: Adrian Prows, MD;  Location: [facility];  Service: Cardiovascular;  Laterality: N/A;  . CYST EXCISION Right    "wrist"  . DILATION AND CURETTAGE OF UTERUS    . FOOT SURGERY Bilateral    "corns removed"  . LEFT HEART CATH AND CORONARY ANGIOGRAPHY N/A 12/24/2017   Procedure: LEFT HEART CATH AND CORONARY ANGIOGRAPHY;  Surgeon: Nigel Mormon, MD;  Location: [facility];  Service: Cardiovascular;  Laterality: N/A;  . TUBAL LIGATION      There were no vitals filed for this visit.   Subjective Assessment - 12/21/20 1848    Subjective  Patient reports increased pain along anterior knee stating the knee cap feels "heavy" since she was walking down her stairs about 3 hours ago and her knee shifted towards the right and felt immediate pain. She denies any popping/clicking and did not fall and was able to bear weight immediately following the incident.    Currently in Pain? Yes    Pain Score 10-Worst pain ever    Pain Location Knee    Pain Orientation Right;Anterior    Pain Descriptors / Indicators Heaviness    Pain Type Surgical pain    Pain Onset 1 to 4 weeks ago    Pain Frequency Constant                             [facility] Adult PT Treatment/Exercise - 12/21/20 0001      Knee/Hip Exercises: Stretches   Hip Flexor Stretch 60 seconds      Knee/Hip Exercises: Standing   Heel Raises 2 sets;10 reps    Other Standing Knee Exercises forward step overs hurdle 1 x 10 each      Knee/Hip Exercises: Supine   Short Arc Quad Sets 2 sets;10 reps    Bridges 2 sets;10 reps    Bridges Limitations partial range  Straight Leg Raises 2 sets;10 reps    Straight Leg Raises Limitations partial range      Knee/Hip Exercises: Sidelying   Hip ABduction 2 sets;10 reps      Manual Therapy   Joint Mobilization patellar mobilizations all planes grade II-III Rt    Passive ROM knee flexion/extension Rt                    PT Short Term Goals - 12/19/20 1742      PT SHORT TERM GOAL #1   Title Patient will be independent and compliant with established HEP.    Baseline issued at eval.    Time 3    Period Weeks    Status On-going    Target Date 12/26/20      PT SHORT TERM GOAL #2   Title Patient will demonstrate at least 100 degrees of knee flexion AROM to improve ability to complete sit<>stand transfer    Baseline see flowsheet    Time 4    Period Weeks    Status Achieved    Target Date 01/02/21      PT SHORT TERM GOAL #3   Title Patient will demonstrate full knee extension AROM to improve gait  mechanics.    Baseline see flowsheet    Time 4    Period Weeks    Status On-going    Target Date 01/02/21      PT SHORT TERM GOAL #4   Title Patient will perform SLR without quad lag.    Baseline poor VMO activation with quad set    Time 4    Period Weeks    Status Deferred    Target Date 01/02/21             PT Long Term Goals - 12/05/20 1351      PT LONG TERM GOAL #1   Title Patient will demonstrate normalized gait mechanics without use of AD.    Baseline see gait assessment. axillary crutches PRN.    Time 8    Period Weeks    Status New    Target Date 01/30/21      PT LONG TERM GOAL #2   Title Patient will squat with proper body mechanics without reports of pain.    Baseline not assessed due to ROM limitations/post-op acuity    Time 8    Period Weeks    Status New    Target Date 01/30/21      PT LONG TERM GOAL #3   Title Patient will demonstrate full and pain free Rt knee AROM to improve ability to complete squatting and bending activity.    Baseline See flowsheet    Time 8    Period Weeks    Status New    Target Date 01/30/21      PT LONG TERM GOAL #4   Title Patient will demonstrate at least 4+/5 strength in Rt knee to improve stability necessary for prolonged standing and walking.    Baseline not assessed due to post-op acuity    Time 8    Period Weeks    Status New    Target Date 01/30/21      PT LONG TERM GOAL #5   Title Patient will score at least 53% function on FOTO to signify clinically meaningful improvement in functional abilities.    Baseline 31% function    Time 8    Period Weeks    Status New    Target Date  01/30/21                 Plan - 12/21/20 1901    Clinical Impression Statement Though patient reports high subjective pain level at beginning of session she is no obvious distress throughout session. Good tolerance to PROM with ability to achieve full knee flexion and extension without increased pain reported. Overall good  tolerance to light progression of quadricep and hip strengthening with patient able to complete partial range of SLR without quad lag. Patient reports a reduction in pain at conclusion of session rated as 8/10.    Examination-Activity Limitations Bed Mobility;Bend;Caring for Others;Hygiene/Grooming;Lift;Locomotion Level;Sit;Squat;Stairs;Stand;Toileting;Transfers    Examination-Participation Restrictions Cleaning;Laundry;Other   playing with grandchildren   Stability/Clinical Decision Making Stable/Uncomplicated    Rehab Potential Good    PT Frequency 2x / week    PT Duration --   6-8 weeks   PT Treatment/Interventions ADLs/Self Care Home Management;Cryotherapy;Dentist;Therapeutic activities;Therapeutic exercise;Balance training;Neuromuscular re-education;Patient/family education;Manual techniques;Passive range of motion;Dry needling;Vasopneumatic Device    PT Next Visit Plan PROM to tolerance. gait training, hip strengthening. knee extension/flexion ROM and strengthening    PT Home Exercise Plan Access Code: [email or web address]    Consulted and Agree with Plan of Care Patient           Patient will benefit from skilled therapeutic intervention in order to improve the following deficits and impairments:  Abnormal gait,Decreased range of motion,Difficulty walking,Decreased activity tolerance,Pain,Decreased balance,Decreased mobility,Decreased strength  Visit Diagnosis: Acute pain of right knee  Stiffness of right knee, not elsewhere classified  Muscle weakness (generalized)  Difficulty in walking, not elsewhere classified     Problem List Patient Active Problem List   Diagnosis Date Noted  . MDD (major depressive disorder) 01/12/2020  . Severe recurrent major depression without psychotic features ([place]) 01/12/2020  . Alcohol use disorder, moderate, dependence ([place]) 01/12/2020  . MDD (major depressive disorder), recurrent, severe, with  psychosis ([place]) 06/01/2019  . Cocaine abuse ([place]) 12/27/2017  . Rectal bleeding 12/27/2017  . NSTEMI (non-ST elevated myocardial infarction) ([place]) 12/23/2017  . Acute viral bronchitis 06/14/2017  . Asthma exacerbation 06/14/2017  . Chest pain 07/05/2016   Gwendolyn Grant, PT, DPT, ATC 12/21/20 7:29 PM PHYSICAL THERAPY DISCHARGE SUMMARY  Visits from Start of Care: 3;   Current functional level related to goals / functional outcomes: See functional goals above   Remaining deficits: See above   Education / Equipment: Spoke with patient on the phone regarding attendance policy and was instructed that she would need to f/u with physician and obtain new referral if she needs to continue with PT at this time with patient verbalizing understanding.  Plan: Patient agrees to discharge.  Patient goals were partially met. Patient is being discharged due to                                                    Has no-showed for 3 consecutive appointments so per attendance policy is appropriate for discharge at this time. ????             Gwendolyn Grant, PT, DPT, ATC 01/18/21 12:20 PM   [facility] [address] [place], [place], [address] Phone: [phone]   Fax:  [phone]  Name: Denise Knight MRN: [id number] Date of Birth: 09-Jun-1966

## 2020-12-26 ENCOUNTER — Ambulatory Visit

## 2020-12-27 ENCOUNTER — Telehealth: Payer: Self-pay

## 2020-12-27 NOTE — Telephone Encounter (Signed)
Patient states she wasn't feeling well and that's the reason she did not make it to her scheduled PT appointment. PT reviewed attendance policy and asked that patient call to cancel if she will be unable to make scheduled appointments. Patient verbalized understanding and confirmed next scheduled appointment.

## 2020-12-28 ENCOUNTER — Ambulatory Visit

## 2021-01-02 ENCOUNTER — Encounter

## 2021-01-02 ENCOUNTER — Telehealth: Payer: Self-pay

## 2021-01-02 NOTE — Telephone Encounter (Signed)
Patient states she overslept and missed her appointment. Attendance policy was reviewed informing patient that she can schedule 1 appointment at a time moving forward due to no showing for appointments. Patient confirmed next scheduled visit.

## 2021-01-04 ENCOUNTER — Ambulatory Visit

## 2021-01-09 ENCOUNTER — Encounter

## 2021-01-10 ENCOUNTER — Emergency Department (HOSPITAL_COMMUNITY)
Admission: EM | Admit: 2021-01-10 | Discharge: 2021-01-10 | Disposition: A | Discharge status: Home / Self Care | Attending: Emergency Medicine | Admitting: Emergency Medicine

## 2021-01-10 ENCOUNTER — Encounter (HOSPITAL_COMMUNITY): Payer: Self-pay | Admitting: Emergency Medicine

## 2021-01-10 ENCOUNTER — Emergency Department (HOSPITAL_COMMUNITY)

## 2021-01-10 DIAGNOSIS — F1490 Cocaine use, unspecified, uncomplicated: Secondary | ICD-10-CM | POA: Insufficient documentation

## 2021-01-10 DIAGNOSIS — R000 Tachycardia, unspecified: Secondary | ICD-10-CM | POA: Diagnosis not present

## 2021-01-10 DIAGNOSIS — Z20822 Contact with and (suspected) exposure to covid-19: Secondary | ICD-10-CM | POA: Diagnosis not present

## 2021-01-10 DIAGNOSIS — J45901 Unspecified asthma with (acute) exacerbation: Secondary | ICD-10-CM | POA: Insufficient documentation

## 2021-01-10 DIAGNOSIS — R079 Chest pain, unspecified: Secondary | ICD-10-CM | POA: Diagnosis present

## 2021-01-10 DIAGNOSIS — I10 Essential (primary) hypertension: Secondary | ICD-10-CM | POA: Diagnosis not present

## 2021-01-10 DIAGNOSIS — R112 Nausea with vomiting, unspecified: Secondary | ICD-10-CM | POA: Insufficient documentation

## 2021-01-10 DIAGNOSIS — I2510 Atherosclerotic heart disease of native coronary artery without angina pectoris: Secondary | ICD-10-CM | POA: Insufficient documentation

## 2021-01-10 DIAGNOSIS — Z955 Presence of coronary angioplasty implant and graft: Secondary | ICD-10-CM | POA: Insufficient documentation

## 2021-01-10 DIAGNOSIS — Z7951 Long term (current) use of inhaled steroids: Secondary | ICD-10-CM | POA: Insufficient documentation

## 2021-01-10 DIAGNOSIS — F17210 Nicotine dependence, cigarettes, uncomplicated: Secondary | ICD-10-CM | POA: Diagnosis not present

## 2021-01-10 DIAGNOSIS — E119 Type 2 diabetes mellitus without complications: Secondary | ICD-10-CM | POA: Diagnosis not present

## 2021-01-10 LAB — BASIC METABOLIC PANEL
Anion gap: 14 (ref 5–15)
BUN: 9 mg/dL (ref 6–20)
CO2: 19 mmol/L — ABNORMAL LOW (ref 22–32)
Calcium: 9.6 mg/dL (ref 8.9–10.3)
Chloride: 104 mmol/L (ref 98–111)
Creatinine, Ser: 1.21 mg/dL — ABNORMAL HIGH (ref 0.44–1.00)
GFR, Estimated: 53 mL/min — ABNORMAL LOW (ref >60)
Glucose, Bld: 99 mg/dL (ref 70–99)
Potassium: 3.7 mmol/L (ref 3.5–5.1)
Sodium: 137 mmol/L (ref 135–145)

## 2021-01-10 LAB — CBC
HCT: 41.8 % (ref 36.0–46.0)
Hemoglobin: 13.6 g/dL (ref 12.0–15.0)
MCH: 26.7 pg (ref 26.0–34.0)
MCHC: 32.5 g/dL (ref 30.0–36.0)
MCV: 82.1 fL (ref 80.0–100.0)
Platelets: 316 10*3/uL (ref 150–400)
RBC: 5.09 MIL/uL (ref 3.87–5.11)
RDW: 15.9 % — ABNORMAL HIGH (ref 11.5–15.5)
WBC: 8.8 10*3/uL (ref 4.0–10.5)
nRBC: 0 % (ref 0.0–0.2)

## 2021-01-10 LAB — RESP PANEL BY RT-PCR (FLU A&B, COVID) ARPGX2
Influenza A by PCR: NEGATIVE
Influenza B by PCR: NEGATIVE
SARS Coronavirus 2 by RT PCR: NEGATIVE

## 2021-01-10 LAB — HEPATIC FUNCTION PANEL
ALT: 18 U/L (ref 0–44)
AST: 31 U/L (ref 15–41)
Albumin: 3.6 g/dL (ref 3.5–5.0)
Alkaline Phosphatase: 55 U/L (ref 38–126)
Bilirubin, Direct: <0.1 mg/dL (ref 0.0–0.2)
Total Bilirubin: 0.6 mg/dL (ref 0.3–1.2)
Total Protein: 7.5 g/dL (ref 6.5–8.1)

## 2021-01-10 LAB — I-STAT BETA HCG BLOOD, ED (MC, WL, AP ONLY): I-stat hCG, quantitative: <5 m[IU]/mL (ref <5)

## 2021-01-10 LAB — TROPONIN I (HIGH SENSITIVITY)
Troponin I (High Sensitivity): 17 ng/L (ref <18)
Troponin I (High Sensitivity): 16 ng/L (ref <18)

## 2021-01-10 MED ORDER — ONDANSETRON 4 MG PO TBDP: 4.0000 mg | ORAL_TABLET | Freq: Three times a day (TID) | ORAL | 0 refills | Status: DC | PRN

## 2021-01-10 MED ORDER — LACTATED RINGERS IV BOLUS
1000.0000 mL | Freq: Once | INTRAVENOUS | Status: AC
  Administered 2021-01-10: 1000 mL via INTRAVENOUS

## 2021-01-10 MED ORDER — LIDOCAINE VISCOUS HCL 2 % MT SOLN
15.0000 mL | Freq: Once | OROMUCOSAL | Status: AC
  Administered 2021-01-10: 15 mL via ORAL
  Filled 2021-01-10: qty 15

## 2021-01-10 MED ORDER — ONDANSETRON HCL 4 MG/2ML IJ SOLN
4.0000 mg | Freq: Once | INTRAMUSCULAR | Status: AC
  Administered 2021-01-10: 4 mg via INTRAVENOUS
  Filled 2021-01-10: qty 2

## 2021-01-10 MED ORDER — NITROGLYCERIN 0.4 MG SL SUBL
0.4000 mg | SUBLINGUAL_TABLET | Freq: Once | SUBLINGUAL | Status: AC
  Administered 2021-01-10: 0.4 mg via SUBLINGUAL
  Filled 2021-01-10: qty 1

## 2021-01-10 MED ORDER — ALUM & MAG HYDROXIDE-SIMETH 200-200-20 MG/5ML PO SUSP
30.0000 mL | Freq: Once | ORAL | Status: AC
  Administered 2021-01-10: 30 mL via ORAL
  Filled 2021-01-10: qty 30

## 2021-01-10 NOTE — ED Notes (Signed)
Pt was brought to the room and hooked up to the monitor. Pt's blood pressure laying down was 133/122. BP was redone 102/81 immediately after the first one. Pt reported difficulty swallowing that started 45 minutes ago.

## 2021-01-10 NOTE — ED Triage Notes (Signed)
Patient here with complaint of chest pain and nausea that started two days ago. Patient describes pain as feeling like an elephant standing on the right side of her chest. Patient alert, oriented, and in no apparent distress at this time.

## 2021-01-10 NOTE — ED Provider Notes (Signed)
[facility] EMERGENCY DEPARTMENT Provider Note   CSN: [id number] Arrival date & time: 01/10/21  1035     History Chief Complaint  Patient presents with  . Chest Pain    Denise Knight is a 55 y.o. female.  HPI Patient presents with chest pain nausea and vomiting.  Began last night.  States it was episodic yesterday but has been constant all day today.  It is in the mid chest.  Feels as if someone sitting on the chest.  No fevers.  Has been vomiting.  States the vomiting does not help or hurt the pain.  Really has had no appetite.  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  Patient did use some cocaine yesterday.  States she used prior to the chest pain started.    Past Medical History:  Diagnosis Date  . Allergy    seasonal  . Anxiety   . Arthritis    "left knee" (07/05/2016)  . Asthma   . Bipolar 1 disorder ([place])   . Depression   . Diabetes mellitus without complication ([place])    pre-  . GERD (gastroesophageal reflux disease)   . Hypertension   . MI (mitral incompetence)   . Post traumatic stress disorder   . Schizophrenia ([place])   . Stomach ulcer   . Substance abuse ([place])    cocaine quit july 2020  . Suicide attempt [facility])     Patient Active Problem List   Diagnosis Date Noted  . MDD (major depressive disorder) 01/12/2020  . Severe recurrent major depression without psychotic features ([place]) 01/12/2020  . Alcohol use disorder, moderate, dependence ([place]) 01/12/2020  . MDD (major depressive disorder), recurrent, severe, with psychosis ([place]) 06/01/2019  . Cocaine abuse ([place]) 12/27/2017  . Rectal bleeding 12/27/2017  . NSTEMI (non-ST elevated myocardial infarction) ([place]) 12/23/2017  . Acute viral bronchitis 06/14/2017  . Asthma exacerbation 06/14/2017  . Chest pain 07/05/2016    Past Surgical History:  Procedure Laterality Date  . CARDIAC CATHETERIZATION  07/05/2016  . CARDIAC CATHETERIZATION N/A 07/05/2016    Procedure: Left Heart Cath and Coronary Angiography;  Surgeon: Adrian Prows, MD;  Location: [facility] CV LAB;  Service: Cardiovascular;  Laterality: N/A;  . CYST EXCISION Right    "wrist"  . DILATION AND CURETTAGE OF UTERUS    . FOOT SURGERY Bilateral    "corns removed"  . LEFT HEART CATH AND CORONARY ANGIOGRAPHY N/A 12/24/2017   Procedure: LEFT HEART CATH AND CORONARY ANGIOGRAPHY;  Surgeon: Nigel Mormon, MD;  Location: [facility] CV LAB;  Service: Cardiovascular;  Laterality: N/A;  . TUBAL LIGATION       OB History   No obstetric history on file.     Family History  Problem Relation Age of Onset  . Breast cancer Other   . Lung cancer Other   . Congestive Heart Failure Other   . Diabetes Other   . Hypertension Other   . Colon cancer Neg Hx   . Colon polyps Neg Hx   . Esophageal cancer Neg Hx   . Stomach cancer Neg Hx   . Rectal cancer Neg Hx     Social History   Tobacco Use  . Smoking status: Current Some Day Smoker    Packs/day: 0.25    Years: 35.00    Pack years: 8.75    Types: Cigarettes    Last attempt to quit: 05/30/2019    Years since quitting: 1.6  .  Smokeless tobacco: Never Used  Vaping Use  . Vaping Use: Never used  Substance Use Topics  . Alcohol use: Yes    Alcohol/week: 7.0 standard drinks    Types: 7 Cans of beer per week  . Drug use: Yes    Types: Marijuana    Home Medications Prior to Admission medications   Medication Sig Start Date End Date Taking? Authorizing Provider  albuterol (VENTOLIN HFA) 108 (90 Base) MCG/ACT inhaler Inhale 1-2 puffs into the lungs every 6 (six) hours as needed for wheezing or shortness of breath. 10/13/20   Loura Halt A, NP  cetirizine (ZYRTEC) 10 MG tablet Take 1 tablet (10 mg total) by mouth daily. 10/13/20   Loura Halt A, NP  FLUoxetine (PROZAC) 20 MG capsule Take 20 mg by mouth daily. Patient not taking: Reported on 12/05/2020    [provider]  fluticasone (FLONASE) 50 MCG/ACT nasal spray Place 1  spray into both nostrils daily. 10/13/20   Loura Halt A, NP  guaiFENesin (MUCINEX) 600 MG 12 hr tablet Take 600 mg by mouth 2 (two) times daily. Patient not taking: Reported on 12/05/2020    [provider]  guaifenesin (ROBITUSSIN) 100 MG/5ML syrup Take 5-10 mLs (100-200 mg total) by mouth every 4 (four) hours as needed for cough. Patient not taking: Reported on 12/05/2020 10/13/20   Loura Halt A, NP  HYDROcodone-acetaminophen (NORCO) 5-325 MG tablet Take 1 tablet by mouth every 4 (four) hours as needed for moderate pain. 50/27/74   Delora Fuel, MD  pantoprazole (PROTONIX) 40 MG tablet Take 1 tablet (40 mg total) by mouth daily. 01/18/20   Connye Burkitt, NP  prazosin (MINIPRESS) 1 MG capsule Take 1 mg by mouth at bedtime. 10/14/20   [provider]  sertraline (ZOLOFT) 25 MG tablet Take 3 tablets (75 mg total) by mouth daily. 01/18/20   Connye Burkitt, NP  traZODone (DESYREL) 100 MG tablet Take 2 tablets (200 mg total) by mouth at bedtime as needed for sleep. 01/17/20   Connye Burkitt, NP  amantadine (SYMMETREL) 100 MG capsule Take 1 capsule (100 mg total) by mouth 2 (two) times daily. 01/17/20 10/13/20  Connye Burkitt, NP    Allergies    Aspirin, Food, and Morphine and related  Review of Systems   Review of Systems  Constitutional: Positive for appetite change.  HENT: Negative for congestion.   Respiratory: Negative for shortness of breath.   Cardiovascular: Positive for chest pain.  Gastrointestinal: Positive for abdominal pain.  Genitourinary: Negative for flank pain.  Musculoskeletal: Negative for back pain.  Skin: Negative for pallor.  Neurological: Negative for weakness.  Psychiatric/Behavioral: Negative for confusion.    Physical Exam Updated Vital Signs BP 119/85   Pulse 94   Temp 98.3 F (36.8 C) (Oral)   Resp (!) 21   SpO2 93%   Physical Exam Vitals and nursing note reviewed.  HENT:     Head: Atraumatic.  Eyes:     Pupils: Pupils are equal, round, and  reactive to light.  Cardiovascular:     Rate and Rhythm: Regular rhythm. Tachycardia present.  Pulmonary:     Breath sounds: No wheezing, rhonchi or rales.  Chest:     Chest wall: No tenderness.  Abdominal:     Palpations: There is no splenomegaly.     Comments: Upper abdominal tenderness without rebound or guarding  Musculoskeletal:     Right lower leg: No edema.     Left lower leg: No edema.  Skin:    General: Skin is warm.     Capillary Refill: Capillary refill takes less than 2 seconds.  Neurological:     Mental Status: She is alert and oriented to person, place, and time.     ED Results / Procedures / Treatments   Labs (all labs ordered are listed, but only abnormal results are displayed) Labs Reviewed  BASIC METABOLIC PANEL - Abnormal; Notable for the following components:      Result Value   CO2 19 (*)    Creatinine, Ser 1.21 (*)    GFR, Estimated 53 (*)    All other components within normal limits  CBC - Abnormal; Notable for the following components:   RDW 15.9 (*)    All other components within normal limits  RESP PANEL BY RT-PCR (FLU A&B, COVID) ARPGX2  RAPID URINE DRUG SCREEN, HOSP PERFORMED  HEPATIC FUNCTION PANEL  I-STAT BETA HCG BLOOD, ED ([facility], [facility], [facility] ONLY)  TROPONIN I (HIGH SENSITIVITY)  TROPONIN I (HIGH SENSITIVITY)    EKG EKG Interpretation  Date/Time:  Wednesday January 10 2021 10:43:35 EST Ventricular Rate:  113 PR Interval:  144 QRS Duration: 62 QT Interval:  344 QTC Calculation: 471 R Axis:   50 Text Interpretation: Sinus tachycardia Nonspecific T wave abnormality Abnormal ECG Confirmed by Davonna Belling [id number]) on 01/10/2021 11:19:30 AM   Radiology DG Chest 2 View  Result Date: 01/10/2021 CLINICAL DATA:  Chest pain and cough for 2 days EXAM: CHEST - 2 VIEW COMPARISON:  02/17/2020 FINDINGS: The heart size and mediastinal contours are within normal limits. Both lungs are clear. The visualized skeletal structures are unremarkable. IMPRESSION:  No active cardiopulmonary disease. Electronically Signed   By: Inez Catalina M.D.   On: 01/10/2021 11:54    Procedures Procedures   Medications Ordered in ED Medications  lactated ringers bolus 1,000 mL (has no administration in time range)  nitroGLYCERIN (NITROSTAT) SL tablet 0.4 mg (has no administration in time range)  ondansetron (ZOFRAN) injection 4 mg (has no administration in time range)    ED Course  I have reviewed the triage vital signs and the nursing notes.  Pertinent labs & imaging results that were available during my care of the patient were reviewed by me and considered in my medical decision making (see chart for details).    Patient with chest pain nausea vomiting.  EKG reassuring.  Troponin negative x2.  Does have history of coronary artery disease but appears to be cocaine related.  Has recently used cocaine also.  Pain improved after treatment.  Troponin negative x2 and has not raise up so ischemia felt less likely.  Also potentially GI cause.  Feels better after GI cocktail also.  Doubt this is an acute ischemia.  Will discharge home.  Instructed to stop using cocaine.  Outpatient follow-up as needed. Final Clinical Impression(s) / ED Diagnoses Final diagnoses:  None    Rx / DC Orders ED Discharge Orders    None       Davonna Belling, MD 01/10/21 1555

## 2021-01-11 ENCOUNTER — Encounter

## 2021-01-16 ENCOUNTER — Encounter

## 2021-01-16 ENCOUNTER — Ambulatory Visit: Attending: Orthopedic Surgery

## 2021-01-18 ENCOUNTER — Telehealth: Payer: Self-pay

## 2021-01-18 ENCOUNTER — Encounter

## 2021-01-18 NOTE — Telephone Encounter (Signed)
Spoke with patient regarding missed appointment on 01/16/21 with patient reporting she forgot it was on this day. Attendance policy was reviewed with patient informing her that she would have to be discharged at this time due to number of no-show appointments and would require a new physician referral in order to return to PT. She verbalized understanding.

## 2021-01-23 ENCOUNTER — Encounter

## 2021-01-25 ENCOUNTER — Encounter

## 2021-01-30 ENCOUNTER — Encounter

## 2021-02-01 ENCOUNTER — Encounter

## 2021-04-09 ENCOUNTER — Other Ambulatory Visit: Payer: Self-pay

## 2021-04-09 ENCOUNTER — Emergency Department (HOSPITAL_COMMUNITY)

## 2021-04-09 ENCOUNTER — Emergency Department (HOSPITAL_COMMUNITY)
Admission: EM | Admit: 2021-04-09 | Discharge: 2021-04-09 | Disposition: A | Discharge status: Home / Self Care | Attending: Emergency Medicine | Admitting: Emergency Medicine

## 2021-04-09 DIAGNOSIS — J45909 Unspecified asthma, uncomplicated: Secondary | ICD-10-CM | POA: Diagnosis not present

## 2021-04-09 DIAGNOSIS — Z20822 Contact with and (suspected) exposure to covid-19: Secondary | ICD-10-CM | POA: Diagnosis not present

## 2021-04-09 DIAGNOSIS — I10 Essential (primary) hypertension: Secondary | ICD-10-CM | POA: Insufficient documentation

## 2021-04-09 DIAGNOSIS — E119 Type 2 diabetes mellitus without complications: Secondary | ICD-10-CM | POA: Insufficient documentation

## 2021-04-09 DIAGNOSIS — M25512 Pain in left shoulder: Secondary | ICD-10-CM | POA: Insufficient documentation

## 2021-04-09 DIAGNOSIS — F17210 Nicotine dependence, cigarettes, uncomplicated: Secondary | ICD-10-CM | POA: Insufficient documentation

## 2021-04-09 DIAGNOSIS — M25522 Pain in left elbow: Secondary | ICD-10-CM | POA: Insufficient documentation

## 2021-04-09 DIAGNOSIS — R29898 Other symptoms and signs involving the musculoskeletal system: Secondary | ICD-10-CM

## 2021-04-09 DIAGNOSIS — R531 Weakness: Secondary | ICD-10-CM | POA: Diagnosis not present

## 2021-04-09 LAB — CBC WITH DIFFERENTIAL/PLATELET
Abs Immature Granulocytes: 0.02 10*3/uL (ref 0.00–0.07)
Basophils Absolute: 0.1 10*3/uL (ref 0.0–0.1)
Basophils Relative: 1 %
Eosinophils Absolute: 0.4 10*3/uL (ref 0.0–0.5)
Eosinophils Relative: 4 %
HCT: 37.8 % (ref 36.0–46.0)
Hemoglobin: 11.7 g/dL — ABNORMAL LOW (ref 12.0–15.0)
Immature Granulocytes: 0 %
Lymphocytes Relative: 33 %
Lymphs Abs: 2.9 10*3/uL (ref 0.7–4.0)
MCH: 26.2 pg (ref 26.0–34.0)
MCHC: 31 g/dL (ref 30.0–36.0)
MCV: 84.8 fL (ref 80.0–100.0)
Monocytes Absolute: 0.5 10*3/uL (ref 0.1–1.0)
Monocytes Relative: 6 %
Neutro Abs: 5 10*3/uL (ref 1.7–7.7)
Neutrophils Relative %: 56 %
Platelets: 279 10*3/uL (ref 150–400)
RBC: 4.46 MIL/uL (ref 3.87–5.11)
RDW: 17.7 % — ABNORMAL HIGH (ref 11.5–15.5)
WBC: 8.8 10*3/uL (ref 4.0–10.5)
nRBC: 0 % (ref 0.0–0.2)

## 2021-04-09 LAB — COMPREHENSIVE METABOLIC PANEL
ALT: 17 U/L (ref 0–44)
AST: 23 U/L (ref 15–41)
Albumin: 3.7 g/dL (ref 3.5–5.0)
Alkaline Phosphatase: 56 U/L (ref 38–126)
Anion gap: 9 (ref 5–15)
BUN: 10 mg/dL (ref 6–20)
CO2: 22 mmol/L (ref 22–32)
Calcium: 9.1 mg/dL (ref 8.9–10.3)
Chloride: 108 mmol/L (ref 98–111)
Creatinine, Ser: 0.88 mg/dL (ref 0.44–1.00)
GFR, Estimated: >60 mL/min (ref >60)
Glucose, Bld: 93 mg/dL (ref 70–99)
Potassium: 3.9 mmol/L (ref 3.5–5.1)
Sodium: 139 mmol/L (ref 135–145)
Total Bilirubin: 0.7 mg/dL (ref 0.3–1.2)
Total Protein: 6.9 g/dL (ref 6.5–8.1)

## 2021-04-09 LAB — CK: Total CK: 265 U/L — ABNORMAL HIGH (ref 38–234)

## 2021-04-09 LAB — RESP PANEL BY RT-PCR (FLU A&B, COVID) ARPGX2
Influenza A by PCR: NEGATIVE
Influenza B by PCR: NEGATIVE
SARS Coronavirus 2 by RT PCR: NEGATIVE

## 2021-04-09 IMAGING — MR MR HEAD WO/W CM
13 of 14 series · 44 of 48 positions shown · IV contrast (gadavist)
Comparison: [DATE]

CLINICAL DATA: Of motor neuron disease. Left arm weakness and pain.

EXAM:
MRI HEAD WITHOUT AND WITH CONTRAST
TECHNIQUE: Multiplanar, multiecho pulse sequences of the brain and surrounding
structures were obtained without and with intravenous contrast.
CONTRAST:  9mL GADAVIST GADOBUTROL 1 MMOL/ML IV SOLN

[Series 5: DWI · axial · 3.0mm · 0.88mm/px · z∈[-76,+65]mm · 8 of 96 slices shown (1 of 4)]
[im 1/96]
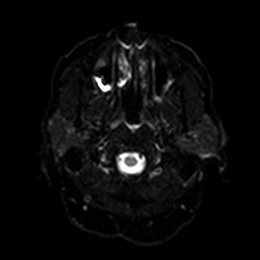
[im 14/96]
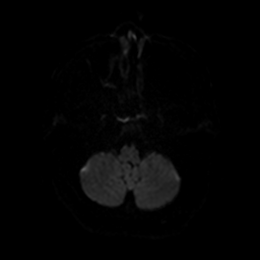
[im 28/96]
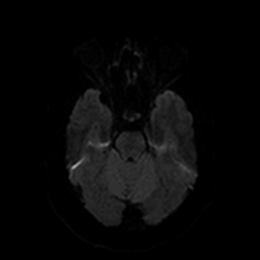
[im 41/96]
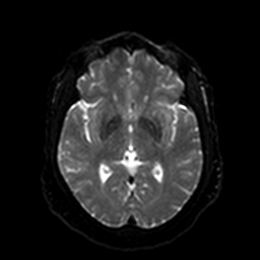
[im 55/96]
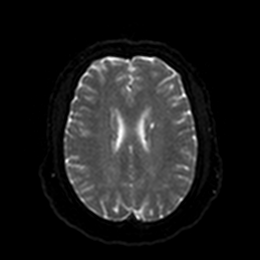
[im 68/96]
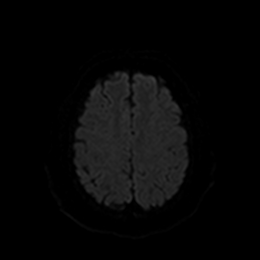
[im 82/96]
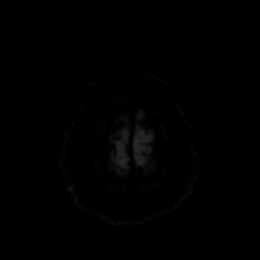
[im 96/96]
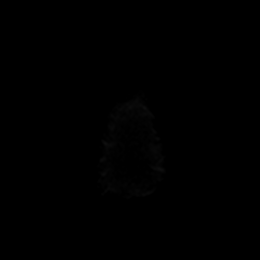

[Series 6: DWI · axial · 3.0mm · 0.88mm/px · z∈[-76,+65]mm · 4 of 48 slices shown (2 of 4)]
[im 1/48]
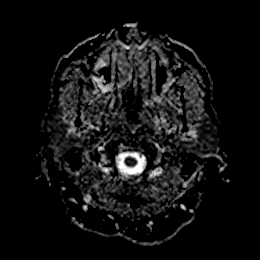
[im 16/48]
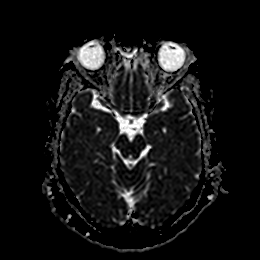
[im 32/48]
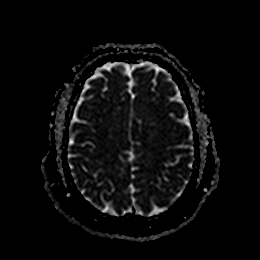
[im 48/48]
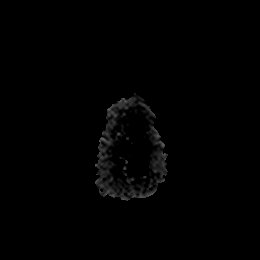

[Series 7: DWI · coronal · 4.0mm · 0.88mm/px · 5 of 64 slices shown (3 of 4)]
[im 1/64]
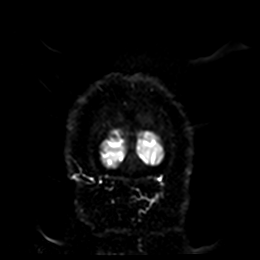
[im 16/64]
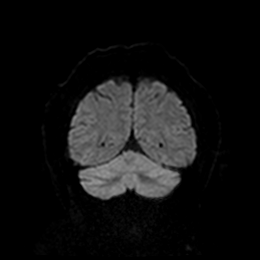
[im 32/64]
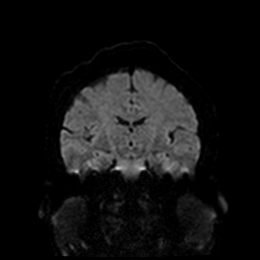
[im 48/64]
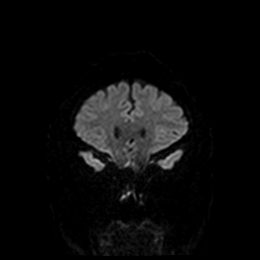
[im 64/64]
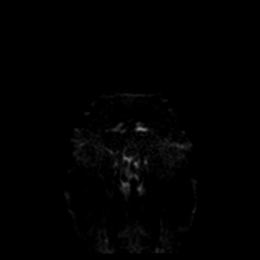

[Series 7: FLAIR · axial · 5.0mm · 0.90mm/px · z∈[-83,+61]mm · 2 of 25 slices shown (1 of 2)]
[im 1/25]
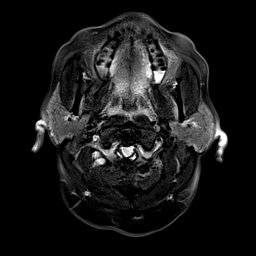
[im 25/25]
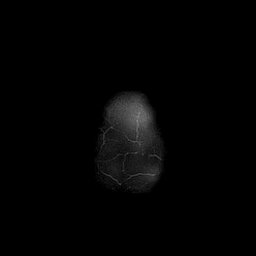

[Series 8: T1 · sagittal · 5.0mm · 0.90mm/px · 2 of 25 slices shown]
[im 1/25]
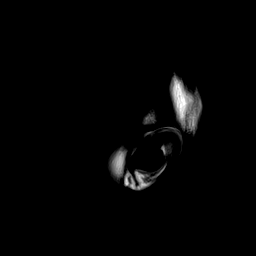
[im 25/25]
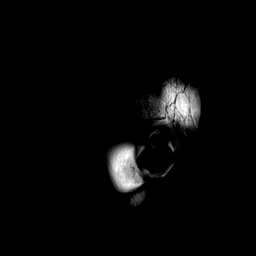

[Series 8: DWI · coronal · 4.0mm · 0.88mm/px · 3 of 32 slices shown (4 of 4)]
[im 1/32]
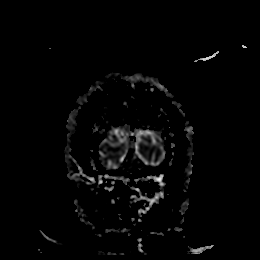
[im 16/32]
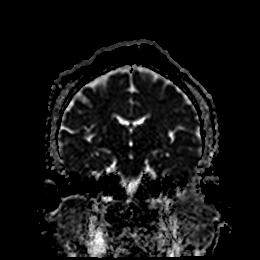
[im 32/32]
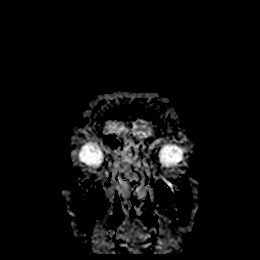

[Series 10: T2 · axial · 5.0mm · 0.72mm/px · z∈[-78,+65]mm · 2 of 25 slices shown]
[im 1/25]
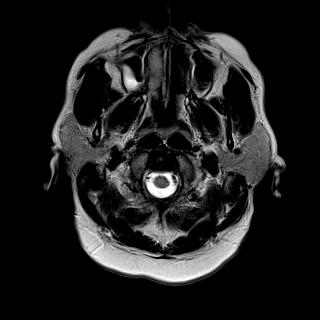
[im 25/25]
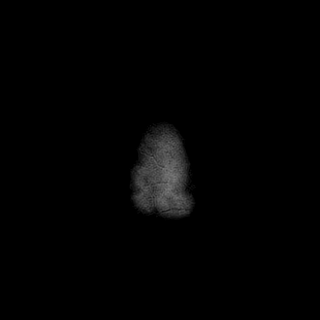

[Series 10: T2 post-contrast · coronal · 5.0mm · 0.72mm/px · 2 of 26 slices shown]
[im 1/26]
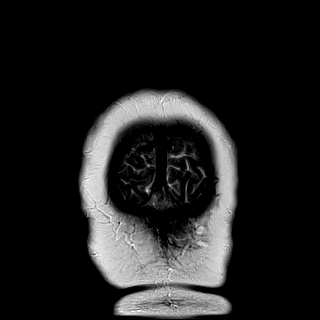
[im 26/26]
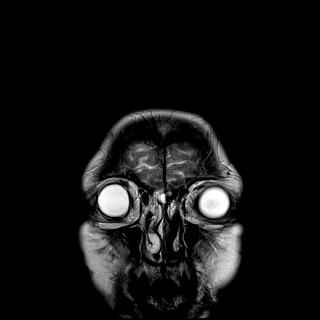

[Series 11: mag_images · axial · 3.0mm · 0.90mm/px · z∈[-76,+65]mm · 4 of 48 slices shown]
[im 1/48]
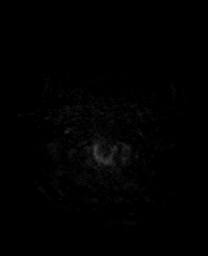
[im 16/48]
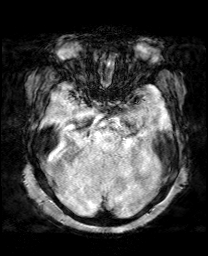
[im 32/48]
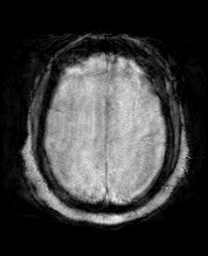
[im 48/48]
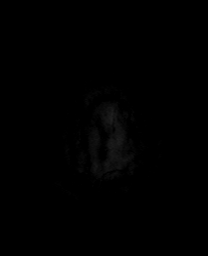

[Series 12: pha_images · axial · 3.0mm · 0.90mm/px · z∈[-73,+65]mm · 4 of 46 slices shown]
[im 1/46]
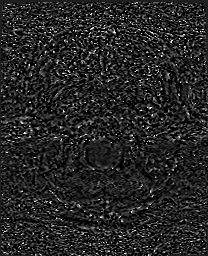
[im 16/46]
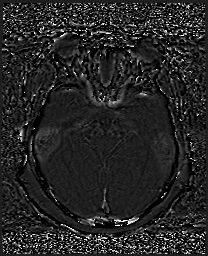
[im 31/46]
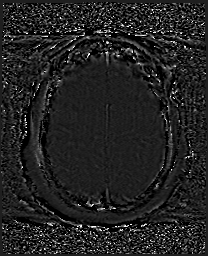
[im 46/46]
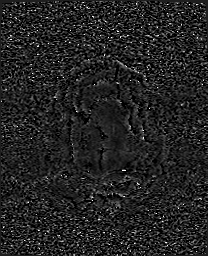

[Series 13: swi_images · axial · 3.0mm · 0.90mm/px · z∈[-76,+65]mm · 4 of 48 slices shown]
[im 1/48]
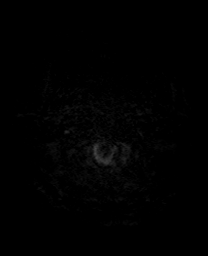
[im 16/48]
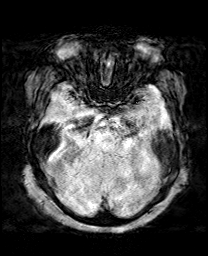
[im 32/48]
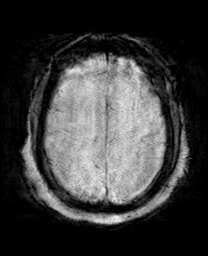
[im 48/48]
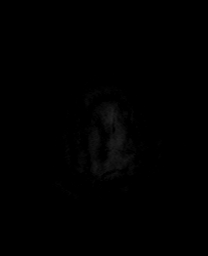

[Series 29: FLAIR · axial · 5.0mm · 0.90mm/px · z∈[+99,+237]mm · 2 of 24 slices shown (2 of 2)]
[im 1/24]
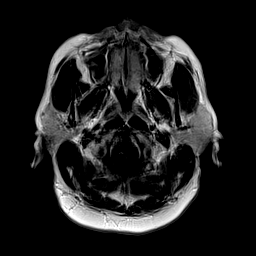
[im 24/24]
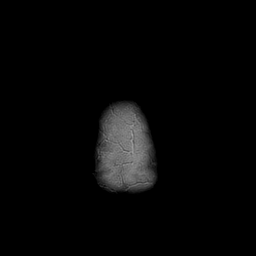

[Series 31: T1 post-contrast · coronal · 5.0mm · 0.34mm/px · 2 of 26 slices shown]
[im 1/26]
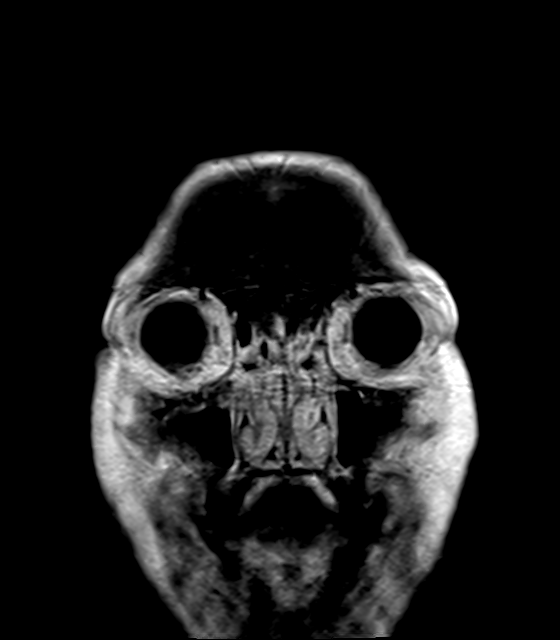
[im 26/26]
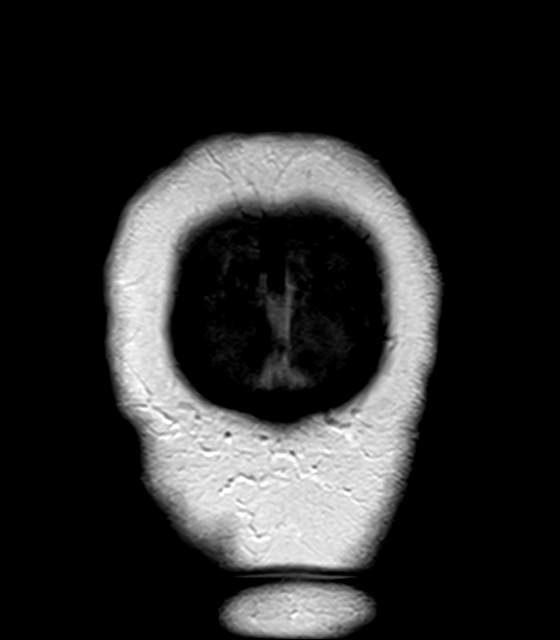

[44 of 48 positions shown; findings below may reference images not displayed]

FINDINGS: Mild-to-moderate motion limited study.  Within this limitation:

Brain: No acute infarction, hemorrhage, hydrocephalus, extra-axial
collection or mass lesion. Similar mild for age T2/FLAIR
hyperintense white matter change, most pronounced in the left corona
radiata and resembling the sequela of chronic microvascular ischemic
disease there. No evidence of abnormal enhancement on the motion
limited old postcontrast imaging.

Vascular: Major arterial flow voids are maintained at the skull
base.

Skull and upper cervical spine: Normal marrow signal.

Sinuses/Orbits: Moderate ethmoid, maxillary and sphenoid sinus
mucosal thickening.

Other: No sizable mastoid effusions.
IMPRESSION: 1. No evidence of acute abnormality on this mild to moderately
motion limited study.
2. Similar appearance of brain in comparison to [DATE] prior
with mild cerebral white matter disease resembling chronic
microvascular ischemic change in the left corona radiata.
3. Moderate paranasal sinus mucosal thickening.

## 2021-04-09 IMAGING — MR MR CERVICAL SPINE WO/W CM
7 of 8 series · 33 of 48 positions shown · IV contrast (Gadavist)
Comparison: None.

EXAM:
MRI CERVICAL SPINE WITHOUT AND WITH CONTRAST
TECHNIQUE: Multiplanar and multiecho pulse sequences of the cervical spine, to
include the craniocervical junction and cervicothoracic junction,
were obtained without and with intravenous contrast.

CONTRAST:  9mL GADAVIST GADOBUTROL 1 MMOL/ML IV SOLN

[Series 15: T2 · sagittal · 3.0mm · 0.69mm/px · 4 of 15 slices shown (1 of 2)]
[im 1/15]
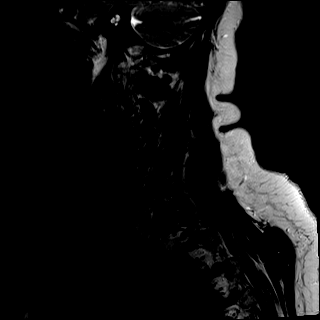
[im 5/15]
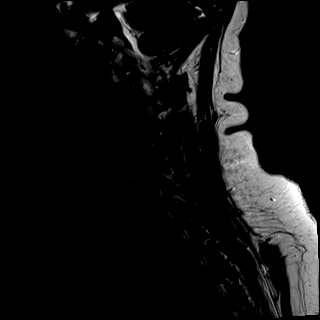
[im 10/15]
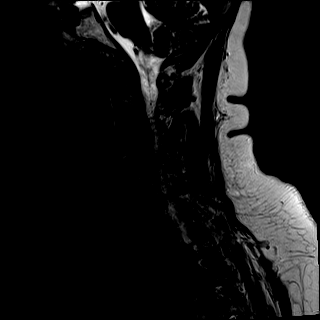
[im 15/15]
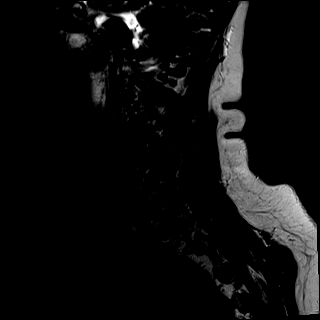

[Series 16: T1 · sagittal · 3.0mm · 0.69mm/px · 4 of 15 slices shown (1 of 2)]
[im 1/15]
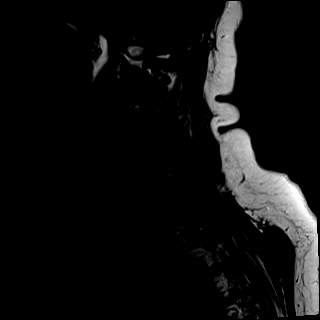
[im 5/15]
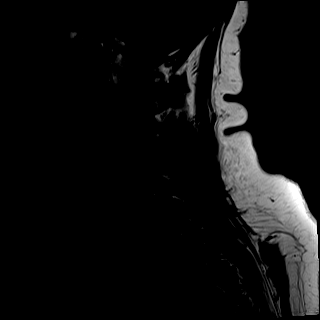
[im 10/15]
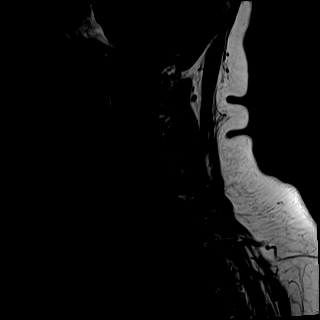
[im 15/15]
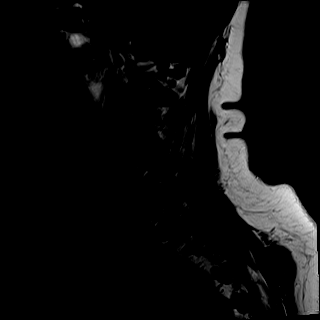

[Series 19: T2 · axial · 3.0mm · 0.66mm/px · z∈[-242,-134]mm · 8 of 35 slices shown (2 of 2)]
[im 1/35]
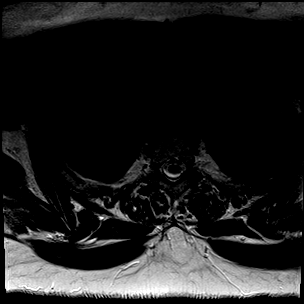
[im 5/35]
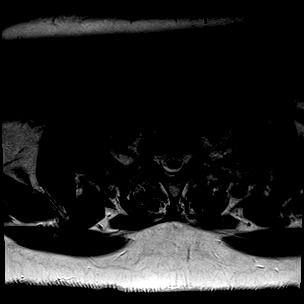
[im 10/35]
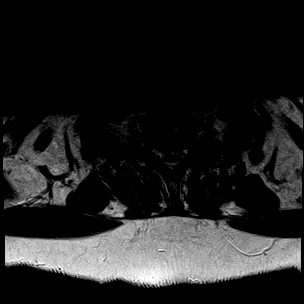
[im 15/35]
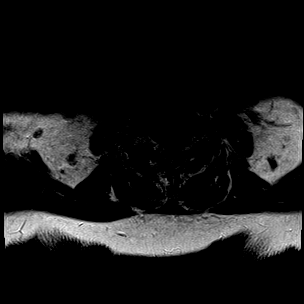
[im 20/35]
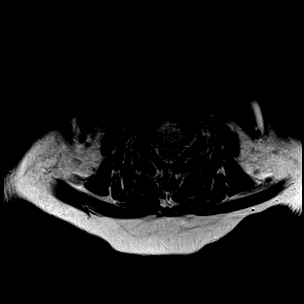
[im 25/35]
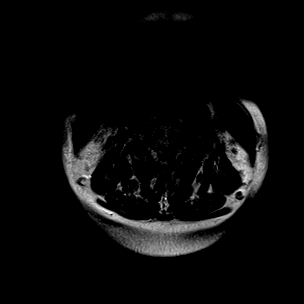
[im 30/35]
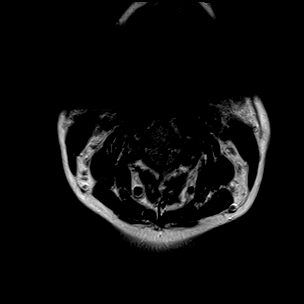
[im 35/35]
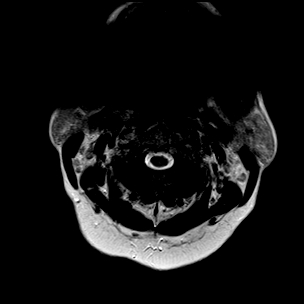

[Series 20: T1 · axial · 3.0mm · 0.45mm/px · z∈[-242,-134]mm · 8 of 35 slices shown (2 of 2)]
[im 1/35]
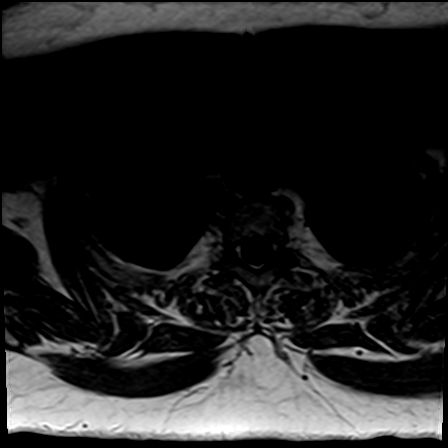
[im 5/35]
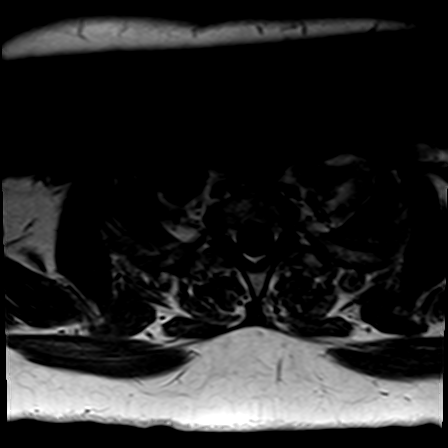
[im 10/35]
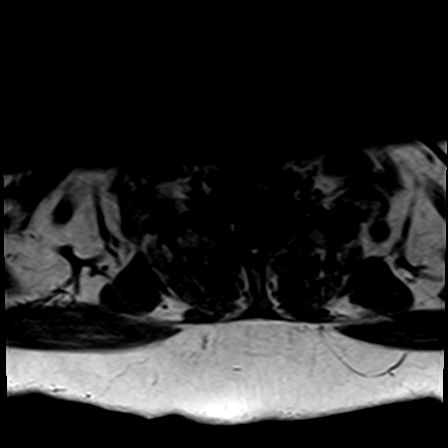
[im 15/35]
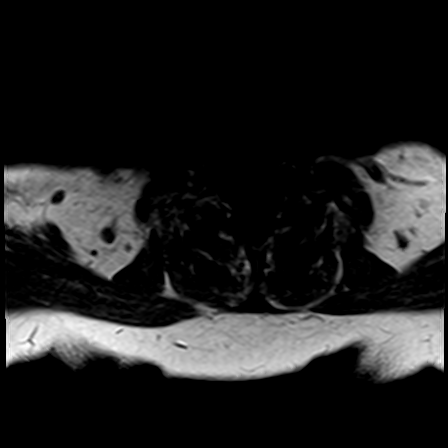
[im 20/35]
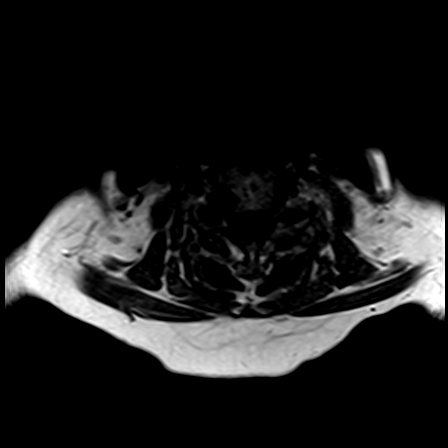
[im 25/35]
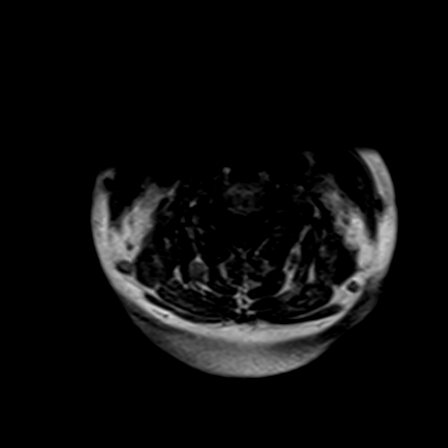
[im 30/35]
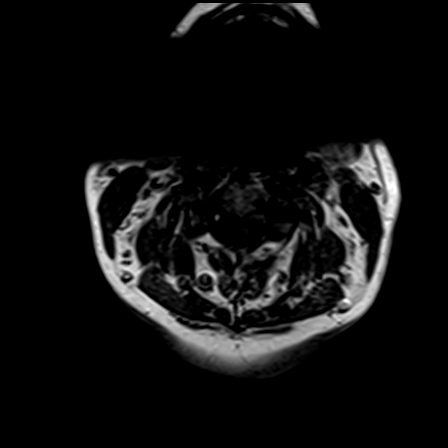
[im 35/35]
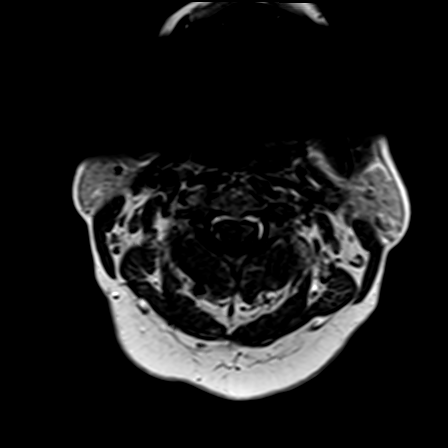

[Series 21: STIR · sagittal · 3.0mm · 0.98mm/px · 4 of 15 slices shown]
[im 1/15]
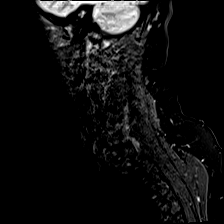
[im 5/15]
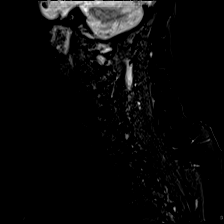
[im 10/15]
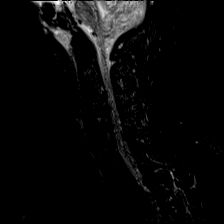
[im 15/15]
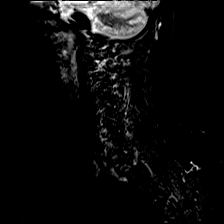

[Series 23: T1 post-contrast · axial · 3.0mm · 0.39mm/px · 1 of 35 slices shown]
[im 1/35]
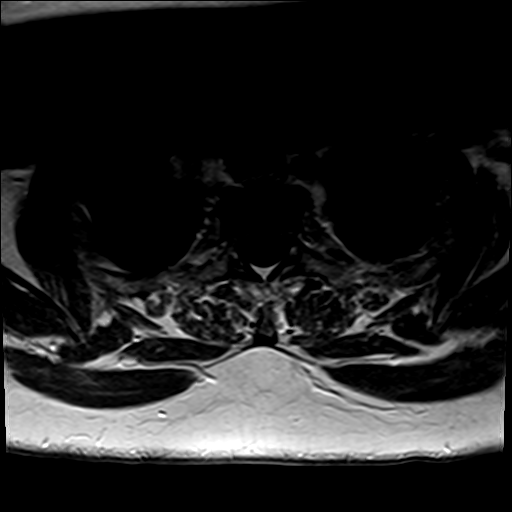

[Series 24: T1 fat-sat post-contrast · sagittal · 3.0mm · 0.43mm/px · 4 of 15 slices shown]
[im 1/15]
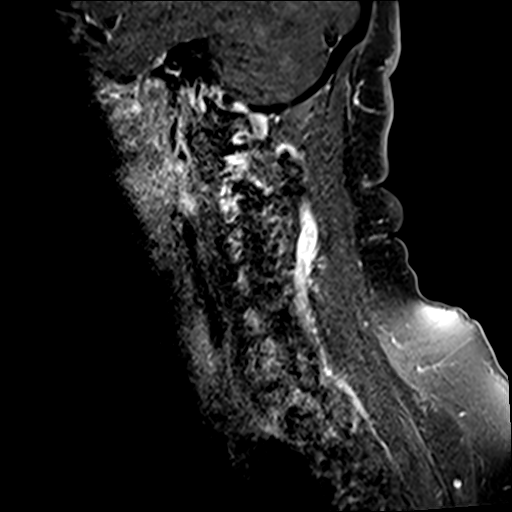
[im 5/15]
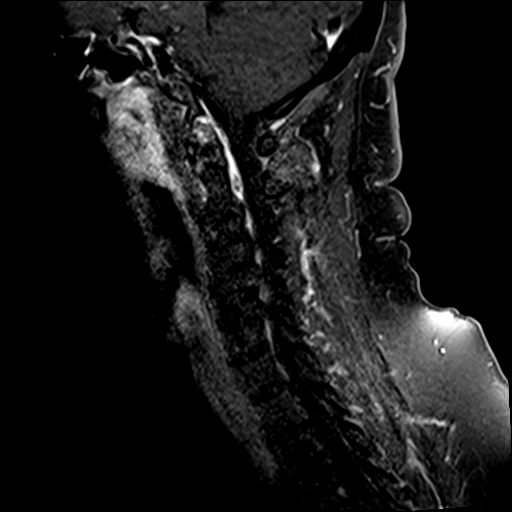
[im 10/15]
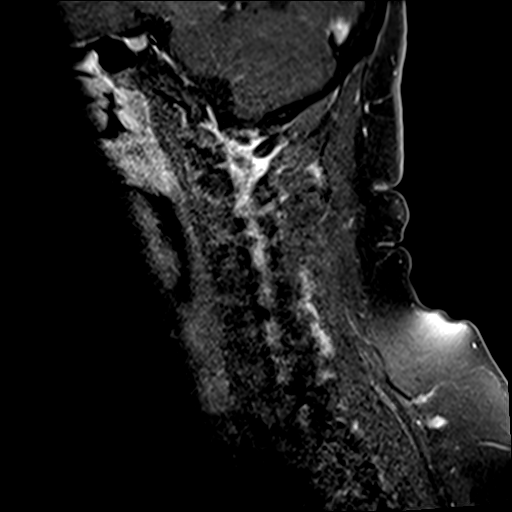
[im 15/15]
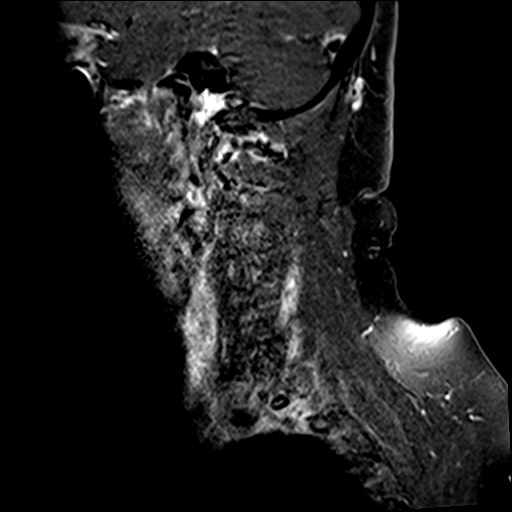

[33 of 48 positions shown; findings below may reference images not displayed]

FINDINGS: Overall, moderately motion limited study. The sagittal STIR and
axial T2 sequences are particularly limited by motion, limiting
evaluation of the cord.

Alignment: No substantial sagittal subluxation.

Vertebrae: No obvious marrow edema on the limited STIR sagittal
sequence to suggest acute fracture discitis/osteomyelitis. Vertebral
body heights are maintained.

Cord: No obvious cord signal abnormality or enhancement on this
moderately motion limited study.

Posterior Fossa, vertebral arteries, paraspinal tissues: Limited
evaluation of the vertebral artery flow voids which appear grossly
maintained. Posterior fossa is better characterized on concurrent
MRI head.

Disc levels:

C2-C3: Left eccentric posterior disc osteophyte complex with right
greater than left facet and uncovertebral hypertrophy. Moderate
right foraminal stenosis. Mild left eccentric canal stenosis.

C3-C4: Posterior disc osteophyte complex and right greater than left
facet and uncovertebral hypertrophy. Resulting moderate to severe
right and moderate left foraminal stenosis and mild to moderate
canal stenosis.

C4-C5: Posterior disc osteophyte complex with right greater than
left facet and uncovertebral hypertrophy. Resulting severe right and
moderate left foraminal stenosis with moderate canal stenosis.

C5-C6: Posterior disc osteophyte complex and right greater than left
facet and uncovertebral hypertrophy. Evaluation of this levels
particularly limited by motion with suspected severe right and
moderate left foraminal stenosis. Mild canal stenosis.

C6-C7: Posterior disc osteophyte complex and mild bilateral
facet/uncovertebral hypertrophy without significant foraminal
stenosis. Mild canal stenosis.

C7-T1: Posterior disc osteophyte complex and bilateral uncovertebral
hypertrophy with mild left foraminal stenosis. No significant canal
stenosis.
IMPRESSION: 1. Moderately motion limited evaluation.
2. Multilevel foraminal stenosis, likely severe on the right at
C4-C5 and C5-C6, moderate to severe on the right at C3-C4, and
moderate on the right at C2-C3 and the left at C4-C5 and C5-C6.
3. Multilevel canal stenosis, moderate at C4-C5 and mild at C2-C3,
C3-C4, C5-C6, and C6-C7.
4. No obvious cord signal abnormality or enhancement with
particularly limited evaluation of the cord due to motion.

## 2021-04-09 MED ORDER — METHOCARBAMOL 500 MG PO TABS
1000.0000 mg | ORAL_TABLET | Freq: Once | ORAL | Status: AC
  Administered 2021-04-09: 1000 mg via ORAL
  Filled 2021-04-09: qty 2

## 2021-04-09 MED ORDER — FENTANYL CITRATE (PF) 100 MCG/2ML IJ SOLN
50.0000 ug | Freq: Once | INTRAMUSCULAR | Status: AC
  Administered 2021-04-09: 50 ug via INTRAVENOUS
  Filled 2021-04-09: qty 2

## 2021-04-09 MED ORDER — GADOBUTROL 1 MMOL/ML IV SOLN
9.0000 mL | Freq: Once | INTRAVENOUS | Status: AC | PRN
  Administered 2021-04-09: 9 mL via INTRAVENOUS

## 2021-04-09 MED ORDER — LORAZEPAM 2 MG/ML IJ SOLN
1.0000 mg | Freq: Once | INTRAMUSCULAR | Status: AC
  Administered 2021-04-09: 1 mg via INTRAVENOUS
  Filled 2021-04-09: qty 1

## 2021-04-09 MED ORDER — METHOCARBAMOL 500 MG PO TABS: 500.0000 mg | ORAL_TABLET | Freq: Two times a day (BID) | ORAL | 0 refills | Status: DC

## 2021-04-09 MED ORDER — HYDROCODONE-ACETAMINOPHEN 5-325 MG PO TABS: 1.0000 | ORAL_TABLET | Freq: Four times a day (QID) | ORAL | 0 refills | Status: DC | PRN

## 2021-04-09 NOTE — Consult Note (Signed)
Neurosurgery Consultation  Reason for Consult: LUE weakness Referring Physician: Marijean Bravo  CC: LUE pain / weakness  HPI: This is a 55 y.o. woman that presents with acute onset LUE weakness and pain. The pain is sharp in quality, severe in intensity, worse with any movement of the L shoulder. No known trauma / inciting event, no other areas of weakness, no new numbness, or parasthesias, no recent change in bowel or bladder function. No h/o shoulder issues, no pain running further down the arm and into the hand. Weakness / pain is all proximal in the shoulder, not extending distally into the hand.    ROS: A 14 point ROS was performed and is negative except as noted in the HPI.   PMHx:  Past Medical History:  Diagnosis Date  . Allergy    seasonal  . Anxiety   . Arthritis    "left knee" (07/05/2016)  . Asthma   . Bipolar 1 disorder ([place])   . Depression   . Diabetes mellitus without complication ([place])    pre-  . GERD (gastroesophageal reflux disease)   . Hypertension   . MI (mitral incompetence)   . Post traumatic stress disorder   . Schizophrenia ([place])   . Stomach ulcer   . Substance abuse ([place])    cocaine quit july 2020  . Suicide attempt ([place])    FamHx:  Family History  Problem Relation Age of Onset  . Breast cancer Other   . Lung cancer Other   . Congestive Heart Failure Other   . Diabetes Other   . Hypertension Other   . Colon cancer Neg Hx   . Colon polyps Neg Hx   . Esophageal cancer Neg Hx   . Stomach cancer Neg Hx   . Rectal cancer Neg Hx    SocHx:  reports that she has been smoking cigarettes. She has a 8.75 pack-year smoking history. She has never used smokeless tobacco. She reports current alcohol use of about 7.0 standard drinks of alcohol per week. She reports current drug use. Drug: Marijuana.  Exam: Vital signs in last 24 hours: Temp:  [97.5 F (36.4 C)-98.6 F (37 C)] 98.6 F (37 C) (05/30 1934) Pulse Rate:  [81-91] 82 (05/30 1934) Resp:  [16-17] 17  (05/30 1934) BP: (114-153)/(91-129) 114/99 (05/30 1934) SpO2:  [94 %-99 %] 98 % (05/30 1934) Weight:  [93.9 kg] 93.9 kg (05/30 1425) General: Awake, alert, cooperative, lying in bed in NAD Head: Normocephalic and atruamatic HEENT: Neck supple Pulmonary: breathing room air comfortably, no evidence of increased work of breathing Cardiac: RRR Abdomen: S NT ND Extremities: Warm and well perfused x4, unable to adduct / abduct / int/ext rotate LUE without severe pain, tender to palpation in the left shoulder and scapula, +empty and full can tests Neuro: AOx3, PERRL, EOMI, FS Strength 5/5 x4, SILTx4, no hoffman's, no clonus   Assessment and Plan: 55 y.o. woman w/ acute onset LUE weakness and pain. MRI personally reviewed, which shows multi-level severe foraminal disease, mild canal stenosis, no signal change.   -no acute neurosurgical intervention indicated at this time, on my exam this was more consistent with a shoulder pathology, I do not think the patient is symptomatic from her radiographic findings on her C-spine, no need for scheduled follow up with me -please call with any concerns or questions  Judith Part, MD 04/09/21 7:41 PM [facility]

## 2021-04-09 NOTE — ED Triage Notes (Signed)
Pt woke up this am around 1000. Pt unable to move left arm and has pain from shoulder down arm. Unable to move arm , can move fingers. Endorses tingling in fingers.  Pt alert and oriented X4

## 2021-04-09 NOTE — ED Provider Notes (Signed)
Care assumed from [place] at shift change, please see her note for full details, but in brief Denise Knight is a 55 y.o. female presents with left shoulder pain and weakness of the left arm primarily at the shoulder and elbow.  Shoulder x-ray unremarkable.  Patient does drink alcohol, concern for potential nerve palsy as symptoms were present when she woke up this morning after drinking last night.  MRI of the brain and cervical spine ordered to rule out stroke or acute spinal process as cause for left arm weakness.  Plan: Follow-up MRIs, if negative can likely be discharged with sling and outpatient follow-up  BP (!) 135/95   Pulse 82   Temp (!) 97.5 F (36.4 C) (Temporal)   Resp 16   Ht 5' 9"  (1.753 m)   Wt 93.9 kg   SpO2 98%   BMI 30.57 kg/m     ED Course/Procedures   Labs Reviewed  CBC WITH DIFFERENTIAL/PLATELET - Abnormal; Notable for the following components:      Result Value   Hemoglobin 11.7 (*)    RDW 17.7 (*)    All other components within normal limits  CK - Abnormal; Notable for the following components:   Total CK 265 (*)    All other components within normal limits  COMPREHENSIVE METABOLIC PANEL   MR Brain W and Wo Contrast  Result Date: 04/09/2021 CLINICAL DATA:  Of motor neuron disease. Left arm weakness and pain. EXAM: MRI HEAD WITHOUT AND WITH CONTRAST TECHNIQUE: Multiplanar, multiecho pulse sequences of the brain and surrounding structures were obtained without and with intravenous contrast. CONTRAST:  35m GADAVIST GADOBUTROL 1 MMOL/ML IV SOLN COMPARISON:  August 17, 2019 FINDINGS: Mild-to-moderate motion limited study.  Within this limitation: Brain: No acute infarction, hemorrhage, hydrocephalus, extra-axial collection or mass lesion. Similar mild for age T2/FLAIR hyperintense white matter change, most pronounced in the left corona radiata and resembling the sequela of chronic microvascular ischemic disease there. No evidence of abnormal  enhancement on the motion limited old postcontrast imaging. Vascular: Major arterial flow voids are maintained at the skull base. Skull and upper cervical spine: Normal marrow signal. Sinuses/Orbits: Moderate ethmoid, maxillary and sphenoid sinus mucosal thickening. Other: No sizable mastoid effusions. IMPRESSION: 1. No evidence of acute abnormality on this mild to moderately motion limited study. 2. Similar appearance of brain in comparison to October 2020 prior with mild cerebral white matter disease resembling chronic microvascular ischemic change in the left corona radiata. 3. Moderate paranasal sinus mucosal thickening. Electronically Signed   By: FMargaretha SheffieldMD   On: 04/09/2021 17:13   MR Cervical Spine W or Wo Contrast  Result Date: 04/09/2021 EXAM: MRI CERVICAL SPINE WITHOUT AND WITH CONTRAST TECHNIQUE: Multiplanar and multiecho pulse sequences of the cervical spine, to include the craniocervical junction and cervicothoracic junction, were obtained without and with intravenous contrast. CONTRAST:  977mGADAVIST GADOBUTROL 1 MMOL/ML IV SOLN COMPARISON:  None. FINDINGS: Overall, moderately motion limited study. The sagittal STIR and axial T2 sequences are particularly limited by motion, limiting evaluation of the cord. Alignment: No substantial sagittal subluxation. Vertebrae: No obvious marrow edema on the limited STIR sagittal sequence to suggest acute fracture discitis/osteomyelitis. Vertebral body heights are maintained. Cord: No obvious cord signal abnormality or enhancement on this moderately motion limited study. Posterior Fossa, vertebral arteries, paraspinal tissues: Limited evaluation of the vertebral artery flow voids which appear grossly maintained. Posterior fossa is better characterized on concurrent MRI head. Disc levels: C2-C3: Left eccentric posterior disc  osteophyte complex with right greater than left facet and uncovertebral hypertrophy. Moderate right foraminal stenosis. Mild left  eccentric canal stenosis. C3-C4: Posterior disc osteophyte complex and right greater than left facet and uncovertebral hypertrophy. Resulting moderate to severe right and moderate left foraminal stenosis and mild to moderate canal stenosis. C4-C5: Posterior disc osteophyte complex with right greater than left facet and uncovertebral hypertrophy. Resulting severe right and moderate left foraminal stenosis with moderate canal stenosis. C5-C6: Posterior disc osteophyte complex and right greater than left facet and uncovertebral hypertrophy. Evaluation of this levels particularly limited by motion with suspected severe right and moderate left foraminal stenosis. Mild canal stenosis. C6-C7: Posterior disc osteophyte complex and mild bilateral facet/uncovertebral hypertrophy without significant foraminal stenosis. Mild canal stenosis. C7-T1: Posterior disc osteophyte complex and bilateral uncovertebral hypertrophy with mild left foraminal stenosis. No significant canal stenosis. IMPRESSION: 1. Moderately motion limited evaluation. 2. Multilevel foraminal stenosis, likely severe on the right at C4-C5 and C5-C6, moderate to severe on the right at C3-C4, and moderate on the right at C2-C3 and the left at C4-C5 and C5-C6. 3. Multilevel canal stenosis, moderate at C4-C5 and mild at C2-C3, C3-C4, C5-C6, and C6-C7. 4. No obvious cord signal abnormality or enhancement with particularly limited evaluation of the cord due to motion. Electronically Signed   By: Margaretha Sheffield MD   On: 04/09/2021 17:33   DG Shoulder Left  Result Date: 04/09/2021 CLINICAL DATA:  Left shoulder pain. EXAM: LEFT SHOULDER - 2+ VIEW COMPARISON:  None. FINDINGS: There is no evidence of fracture or dislocation. There is no evidence of arthropathy or other focal bone abnormality. Soft tissues are unremarkable. IMPRESSION: Negative. Electronically Signed   By: Lajean Manes M.D.   On: 04/09/2021 15:13     Procedures  MDM   MRI is returned, no  acute abnormalities noted on brain, but MRI of the cervical spine with multilevel foraminal stenosis, more severe on the right, as well as some mild to moderate canal stenosis, study somewhat limited due to some motion artifact, but no obvious cord signal abnormality.  Will discuss with neurosurgery given weakness associated with pain.  Dr. Venetia Constable with neurosurgery has evaluated patient, feels this is more likely a primary shoulder issue and is less likely coming from degenerative changes noted over the cervical spine.  Patient's pain has been improved here in the ED with treatment, will discharge with sling, but cautioned to do range of motion exercises at least 3 times a day to prevent frozen shoulder and provided pain medication, patient not able to tolerate NSAIDs due to prior GI bleed.  We will also prescribe muscle relaxer and encourage patient to follow-up with her orthopedist.       Felma, Pfefferle 04/09/21 2021    Dorie Rank, MD 04/10/21 1505

## 2021-04-09 NOTE — ED Notes (Signed)
Patient transported to X-ray 

## 2021-04-09 NOTE — ED Notes (Signed)
Patient transported to MRI 

## 2021-04-09 NOTE — ED Notes (Signed)
Patient transferred to MRI

## 2021-04-09 NOTE — Discharge Instructions (Addendum)
MRIs of your brain showed no evidence of stroke or other abnormality.  MRI of your neck shows some chronic degenerative changes causing some canal and foraminal stenosis.  This seems to be worse on the right and is less likely causing your symptoms today.  Suspect this is more so a primary shoulder issue, use sling, but take arm out of sling at least 3 times a day to do range of motion exercises and use medications provided to help with pain.  Follow-up with with the orthopedist you have previously seen for your knee.  Use prescribed medications to help with pain.  Return for new or worsening symptoms.

## 2021-04-09 NOTE — ED Provider Notes (Signed)
[facility] EMERGENCY DEPARTMENT Provider Note   CSN: [id number] Arrival date & time: 04/09/21  1341     History Chief Complaint  Patient presents with  . Shoulder Pain    Denise Knight is a 55 y.o. female presenting for evaluation of left shoulder pain and weakness.  Patient states that when she woke up this morning at 10 AM she had pain in her left shoulder and elbow.  She has been unable to lift her left shoulder or move her left elbow since this morning.  She has no difficulty moving her hand or fingers.  She is unable to move it due to both weakness and pain.  No fall, trauma, or injury.  No significant neck pain.  No symptoms on the right side.  No history of similar.  She does have a history of alcohol use, drank last night but states it was a normal amount.  Denies drug use.  She denies fevers or chills.   Additional history obtained per chart review.  Patient with a history of bipolar, prediabetes, GERD, hypertension, schizophrenia.  HPI     Past Medical History:  Diagnosis Date  . Allergy    seasonal  . Anxiety   . Arthritis    "left knee" (07/05/2016)  . Asthma   . Bipolar 1 disorder ([place])   . Depression   . Diabetes mellitus without complication ([place])    pre-  . GERD (gastroesophageal reflux disease)   . Hypertension   . MI (mitral incompetence)   . Post traumatic stress disorder   . Schizophrenia ([place])   . Stomach ulcer   . Substance abuse ([place])    cocaine quit july 2020  . Suicide attempt [facility])     Patient Active Problem List   Diagnosis Date Noted  . MDD (major depressive disorder) 01/12/2020  . Severe recurrent major depression without psychotic features ([place]) 01/12/2020  . Alcohol use disorder, moderate, dependence ([place]) 01/12/2020  . MDD (major depressive disorder), recurrent, severe, with psychosis ([place]) 06/01/2019  . Cocaine abuse ([place]) 12/27/2017  . Rectal bleeding 12/27/2017  . NSTEMI (non-ST elevated myocardial  infarction) ([place]) 12/23/2017  . Acute viral bronchitis 06/14/2017  . Asthma exacerbation 06/14/2017  . Chest pain 07/05/2016    Past Surgical History:  Procedure Laterality Date  . CARDIAC CATHETERIZATION  07/05/2016  . CARDIAC CATHETERIZATION N/A 07/05/2016   Procedure: Left Heart Cath and Coronary Angiography;  Surgeon: Adrian Prows, MD;  Location: [facility];  Service: Cardiovascular;  Laterality: N/A;  . CYST EXCISION Right    "wrist"  . DILATION AND CURETTAGE OF UTERUS    . FOOT SURGERY Bilateral    "corns removed"  . LEFT HEART CATH AND CORONARY ANGIOGRAPHY N/A 12/24/2017   Procedure: LEFT HEART CATH AND CORONARY ANGIOGRAPHY;  Surgeon: Nigel Mormon, MD;  Location: [facility];  Service: Cardiovascular;  Laterality: N/A;  . TUBAL LIGATION       OB History   No obstetric history on file.     Family History  Problem Relation Age of Onset  . Breast cancer Other   . Lung cancer Other   . Congestive Heart Failure Other   . Diabetes Other   . Hypertension Other   . Colon cancer Neg Hx   . Colon polyps Neg Hx   . Esophageal cancer Neg Hx   . Stomach cancer Neg Hx   . Rectal cancer Neg Hx     Social History  Tobacco Use  . Smoking status: Current Some Day Smoker    Packs/day: 0.25    Years: 35.00    Pack years: 8.75    Types: Cigarettes    Last attempt to quit: 05/30/2019    Years since quitting: 1.8  . Smokeless tobacco: Never Used  Vaping Use  . Vaping Use: Never used  Substance Use Topics  . Alcohol use: Yes    Alcohol/week: 7.0 standard drinks    Types: 7 Cans of beer per week  . Drug use: Yes    Types: Marijuana    Home Medications Prior to Admission medications   Medication Sig Start Date End Date Taking? Authorizing Provider  albuterol (VENTOLIN HFA) 108 (90 Base) MCG/ACT inhaler Inhale 1-2 puffs into the lungs every 6 (six) hours as needed for wheezing or shortness of breath. 10/13/20   Loura Halt A, NP  cetirizine (ZYRTEC) 10 MG  tablet Take 1 tablet (10 mg total) by mouth daily. 10/13/20   Loura Halt A, NP  FLUoxetine (PROZAC) 20 MG capsule Take 20 mg by mouth daily. Patient not taking: Reported on 12/05/2020    [provider]  fluticasone (FLONASE) 50 MCG/ACT nasal spray Place 1 spray into both nostrils daily. 10/13/20   Loura Halt A, NP  guaiFENesin (MUCINEX) 600 MG 12 hr tablet Take 600 mg by mouth 2 (two) times daily. Patient not taking: Reported on 12/05/2020    [provider]  guaifenesin (ROBITUSSIN) 100 MG/5ML syrup Take 5-10 mLs (100-200 mg total) by mouth every 4 (four) hours as needed for cough. Patient not taking: Reported on 12/05/2020 10/13/20   Loura Halt A, NP  HYDROcodone-acetaminophen (NORCO) 5-325 MG tablet Take 1 tablet by mouth every 4 (four) hours as needed for moderate pain. 50/09/38   Delora Fuel, MD  ondansetron (ZOFRAN-ODT) 4 MG disintegrating tablet Take 1 tablet (4 mg total) by mouth every 8 (eight) hours as needed for nausea or vomiting. 01/10/21   Davonna Belling, MD  pantoprazole (PROTONIX) 40 MG tablet Take 1 tablet (40 mg total) by mouth daily. 01/18/20   Connye Burkitt, NP  prazosin (MINIPRESS) 1 MG capsule Take 1 mg by mouth at bedtime. 10/14/20   [provider]  sertraline (ZOLOFT) 25 MG tablet Take 3 tablets (75 mg total) by mouth daily. 01/18/20   Connye Burkitt, NP  traZODone (DESYREL) 100 MG tablet Take 2 tablets (200 mg total) by mouth at bedtime as needed for sleep. 01/17/20   Connye Burkitt, NP  amantadine (SYMMETREL) 100 MG capsule Take 1 capsule (100 mg total) by mouth 2 (two) times daily. 01/17/20 10/13/20  Connye Burkitt, NP    Allergies    Aspirin, Food, and Morphine and related  Review of Systems   Review of Systems  Musculoskeletal: Positive for arthralgias.  Neurological: Positive for weakness and numbness.  All other systems reviewed and are negative.   Physical Exam Updated Vital Signs BP (!) 135/91   Pulse 86   Temp (!) 97.5 F (36.4 C)  (Temporal)   Resp 16   Ht 5' 9"  (1.753 m)   Wt 93.9 kg   SpO2 95%   BMI 30.57 kg/m   Physical Exam Vitals and nursing note reviewed.  Constitutional:      General: She is not in acute distress.    Appearance: She is well-developed.     Comments: Sitting in the bed in NAD  HENT:     Head: Normocephalic and atraumatic.  Eyes:  Conjunctiva/sclera: Conjunctivae normal.     Pupils: Pupils are equal, round, and reactive to light.  Cardiovascular:     Rate and Rhythm: Normal rate and regular rhythm.     Pulses: Normal pulses.  Pulmonary:     Effort: Pulmonary effort is normal. No respiratory distress.     Breath sounds: Normal breath sounds. No wheezing.  Abdominal:     General: There is no distension.     Palpations: Abdomen is soft. There is no mass.     Tenderness: There is no abdominal tenderness. There is no guarding or rebound.  Musculoskeletal:     Cervical back: Normal range of motion and neck supple.     Comments: Laxity of the muscles of the left shoulder noted when compared to the right.  Patient unable to abduct due to weakness and pain.  Unable to flex or extend the elbow due to pain.  Able to move fingers without difficulty.  Good distal sensation and cap refill.  Radial pulses 2+ bilaterally.  Decreased grip strength of the left hand, patient states she has pain in her elbow when she tries  Skin:    General: Skin is warm and dry.     Capillary Refill: Capillary refill takes less than 2 seconds.  Neurological:     Mental Status: She is alert and oriented to person, place, and time.     ED Results / Procedures / Treatments   Labs (all labs ordered are listed, but only abnormal results are displayed) Labs Reviewed  CBC WITH DIFFERENTIAL/PLATELET - Abnormal; Notable for the following components:      Result Value   Hemoglobin 11.7 (*)    RDW 17.7 (*)    All other components within normal limits  COMPREHENSIVE METABOLIC PANEL  CK     EKG None  Radiology No results found.  Procedures Procedures   Medications Ordered in ED Medications  methocarbamol (ROBAXIN) tablet 1,000 mg (1,000 mg Oral Given 04/09/21 1440)  fentaNYL (SUBLIMAZE) injection 50 mcg (50 mcg Intravenous Given 04/09/21 1441)    ED Course  I have reviewed the triage vital signs and the nursing notes.  Pertinent labs & imaging results that were available during my care of the patient were reviewed by me and considered in my medical decision making (see chart for details).    MDM Rules/Calculators/A&P                          Patient presenting for evaluation of left shoulder and elbow pain and weakness.  On exam, she has decreased muscle tone on the right side.  Limited movement due to pain and weakness.  However good distal sensation and cap refill of the fingers.  Favor palsy over stroke.  As it is, patient's last seen normal was last night, she is out the window for tPA.  However as patient does not report any significant alcohol or drug use outside of her normal last night, will obtain MRIs to ensure no mass, lesion, concerning neuro impingement. Basic labs ordered.   Shoulder x-ray viewed and independently interpreted by me, no obvious fracture or dislocation.  Patient signed out to K. Bick, PA-C for follow-up on MRI imaging.  Final Clinical Impression(s) / ED Diagnoses Final diagnoses:  None    Rx / DC Orders ED Discharge Orders    None       Franchot Heidelberg, PA-C 04/09/21 1527    Gareth Morgan, MD 04/10/21 1454

## 2021-05-09 ENCOUNTER — Other Ambulatory Visit: Payer: Self-pay | Admitting: Family Medicine

## 2021-05-09 DIAGNOSIS — Z1231 Encounter for screening mammogram for malignant neoplasm of breast: Secondary | ICD-10-CM

## 2021-07-03 ENCOUNTER — Ambulatory Visit

## 2021-07-03 ENCOUNTER — Other Ambulatory Visit: Payer: Self-pay

## 2021-07-03 ENCOUNTER — Ambulatory Visit
Admission: RE | Admit: 2021-07-03 | Discharge: 2021-07-03 | Disposition: A | Discharge status: Home / Self Care | Source: Ambulatory Visit | Attending: Family Medicine | Admitting: Family Medicine

## 2021-07-03 DIAGNOSIS — Z1231 Encounter for screening mammogram for malignant neoplasm of breast: Secondary | ICD-10-CM

## 2021-07-25 ENCOUNTER — Encounter (HOSPITAL_COMMUNITY): Payer: Self-pay | Admitting: Emergency Medicine

## 2021-07-25 ENCOUNTER — Emergency Department (HOSPITAL_COMMUNITY)
Admission: EM | Admit: 2021-07-25 | Discharge: 2021-07-25 | Disposition: A | Discharge status: Home / Self Care | Attending: Emergency Medicine | Admitting: Emergency Medicine

## 2021-07-25 ENCOUNTER — Emergency Department (HOSPITAL_COMMUNITY)

## 2021-07-25 DIAGNOSIS — R110 Nausea: Secondary | ICD-10-CM | POA: Diagnosis not present

## 2021-07-25 DIAGNOSIS — Z20822 Contact with and (suspected) exposure to covid-19: Secondary | ICD-10-CM | POA: Diagnosis not present

## 2021-07-25 DIAGNOSIS — R0989 Other specified symptoms and signs involving the circulatory and respiratory systems: Secondary | ICD-10-CM | POA: Insufficient documentation

## 2021-07-25 DIAGNOSIS — R5383 Other fatigue: Secondary | ICD-10-CM | POA: Insufficient documentation

## 2021-07-25 DIAGNOSIS — E119 Type 2 diabetes mellitus without complications: Secondary | ICD-10-CM | POA: Diagnosis not present

## 2021-07-25 DIAGNOSIS — F17210 Nicotine dependence, cigarettes, uncomplicated: Secondary | ICD-10-CM | POA: Diagnosis not present

## 2021-07-25 DIAGNOSIS — R059 Cough, unspecified: Secondary | ICD-10-CM | POA: Insufficient documentation

## 2021-07-25 DIAGNOSIS — R519 Headache, unspecified: Secondary | ICD-10-CM

## 2021-07-25 DIAGNOSIS — J45901 Unspecified asthma with (acute) exacerbation: Secondary | ICD-10-CM | POA: Diagnosis not present

## 2021-07-25 DIAGNOSIS — G44209 Tension-type headache, unspecified, not intractable: Secondary | ICD-10-CM

## 2021-07-25 DIAGNOSIS — J3489 Other specified disorders of nose and nasal sinuses: Secondary | ICD-10-CM | POA: Diagnosis not present

## 2021-07-25 DIAGNOSIS — Z7951 Long term (current) use of inhaled steroids: Secondary | ICD-10-CM | POA: Insufficient documentation

## 2021-07-25 LAB — CBC WITH DIFFERENTIAL/PLATELET
Abs Immature Granulocytes: 0.01 10*3/uL (ref 0.00–0.07)
Basophils Absolute: 0.1 10*3/uL (ref 0.0–0.1)
Basophils Relative: 2 %
Eosinophils Absolute: 0.3 10*3/uL (ref 0.0–0.5)
Eosinophils Relative: 5 %
HCT: 44.7 % (ref 36.0–46.0)
Hemoglobin: 14.6 g/dL (ref 12.0–15.0)
Immature Granulocytes: 0 %
Lymphocytes Relative: 40 %
Lymphs Abs: 2.1 10*3/uL (ref 0.7–4.0)
MCH: 27.3 pg (ref 26.0–34.0)
MCHC: 32.7 g/dL (ref 30.0–36.0)
MCV: 83.7 fL (ref 80.0–100.0)
Monocytes Absolute: 0.4 10*3/uL (ref 0.1–1.0)
Monocytes Relative: 7 %
Neutro Abs: 2.3 10*3/uL (ref 1.7–7.7)
Neutrophils Relative %: 46 %
Platelets: 301 10*3/uL (ref 150–400)
RBC: 5.34 MIL/uL — ABNORMAL HIGH (ref 3.87–5.11)
RDW: 15.2 % (ref 11.5–15.5)
WBC: 5.2 10*3/uL (ref 4.0–10.5)
nRBC: 0 % (ref 0.0–0.2)

## 2021-07-25 LAB — COMPREHENSIVE METABOLIC PANEL
ALT: 28 U/L (ref 0–44)
AST: 25 U/L (ref 15–41)
Albumin: 3.6 g/dL (ref 3.5–5.0)
Alkaline Phosphatase: 56 U/L (ref 38–126)
Anion gap: 9 (ref 5–15)
BUN: 12 mg/dL (ref 6–20)
CO2: 22 mmol/L (ref 22–32)
Calcium: 9.5 mg/dL (ref 8.9–10.3)
Chloride: 108 mmol/L (ref 98–111)
Creatinine, Ser: 1.24 mg/dL — ABNORMAL HIGH (ref 0.44–1.00)
GFR, Estimated: 51 mL/min — ABNORMAL LOW (ref >60)
Glucose, Bld: 132 mg/dL — ABNORMAL HIGH (ref 70–99)
Potassium: 3.3 mmol/L — ABNORMAL LOW (ref 3.5–5.1)
Sodium: 139 mmol/L (ref 135–145)
Total Bilirubin: 0.4 mg/dL (ref 0.3–1.2)
Total Protein: 7.2 g/dL (ref 6.5–8.1)

## 2021-07-25 LAB — RESP PANEL BY RT-PCR (FLU A&B, COVID) ARPGX2
Influenza A by PCR: NEGATIVE
Influenza B by PCR: NEGATIVE
SARS Coronavirus 2 by RT PCR: NEGATIVE

## 2021-07-25 IMAGING — CR DG CHEST 2V
2 series · 2 of 2 positions shown · non-contrast
Comparison: Radiograph [DATE]

CLINICAL DATA: Shortness of breath/cough

EXAM:
CHEST - 2 VIEW

[chest lat]
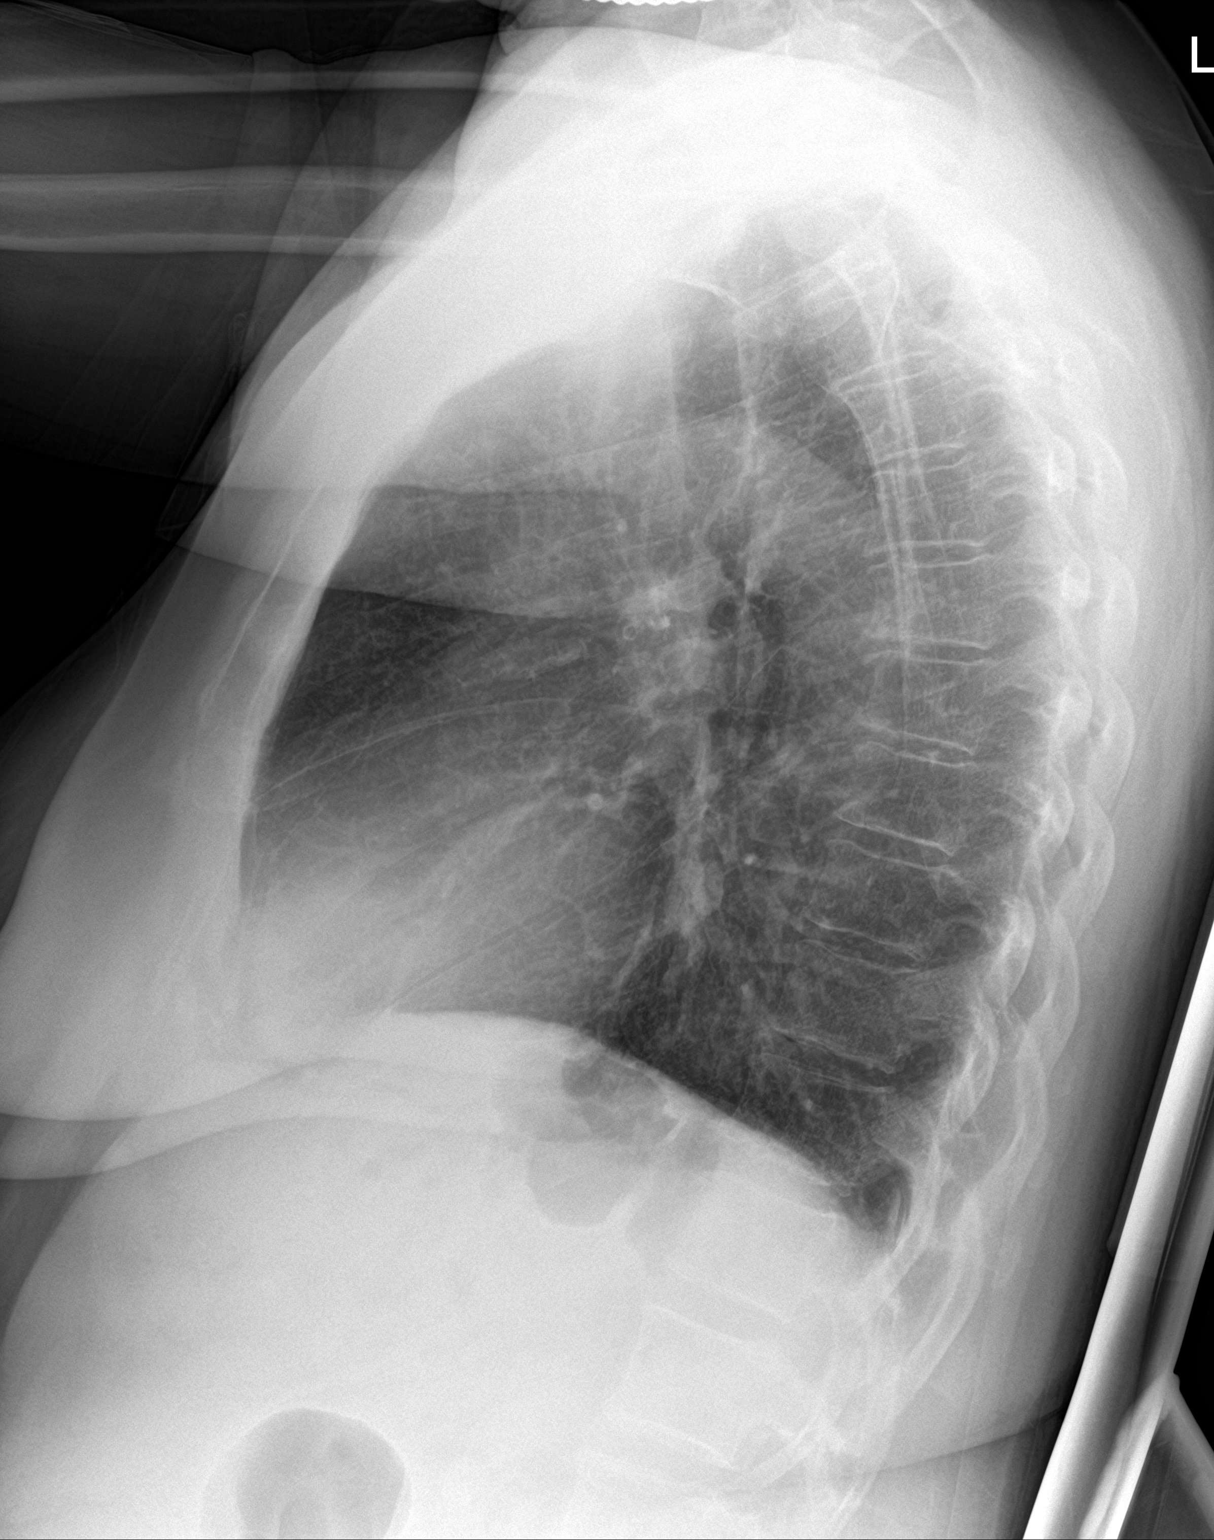

[chest ap]
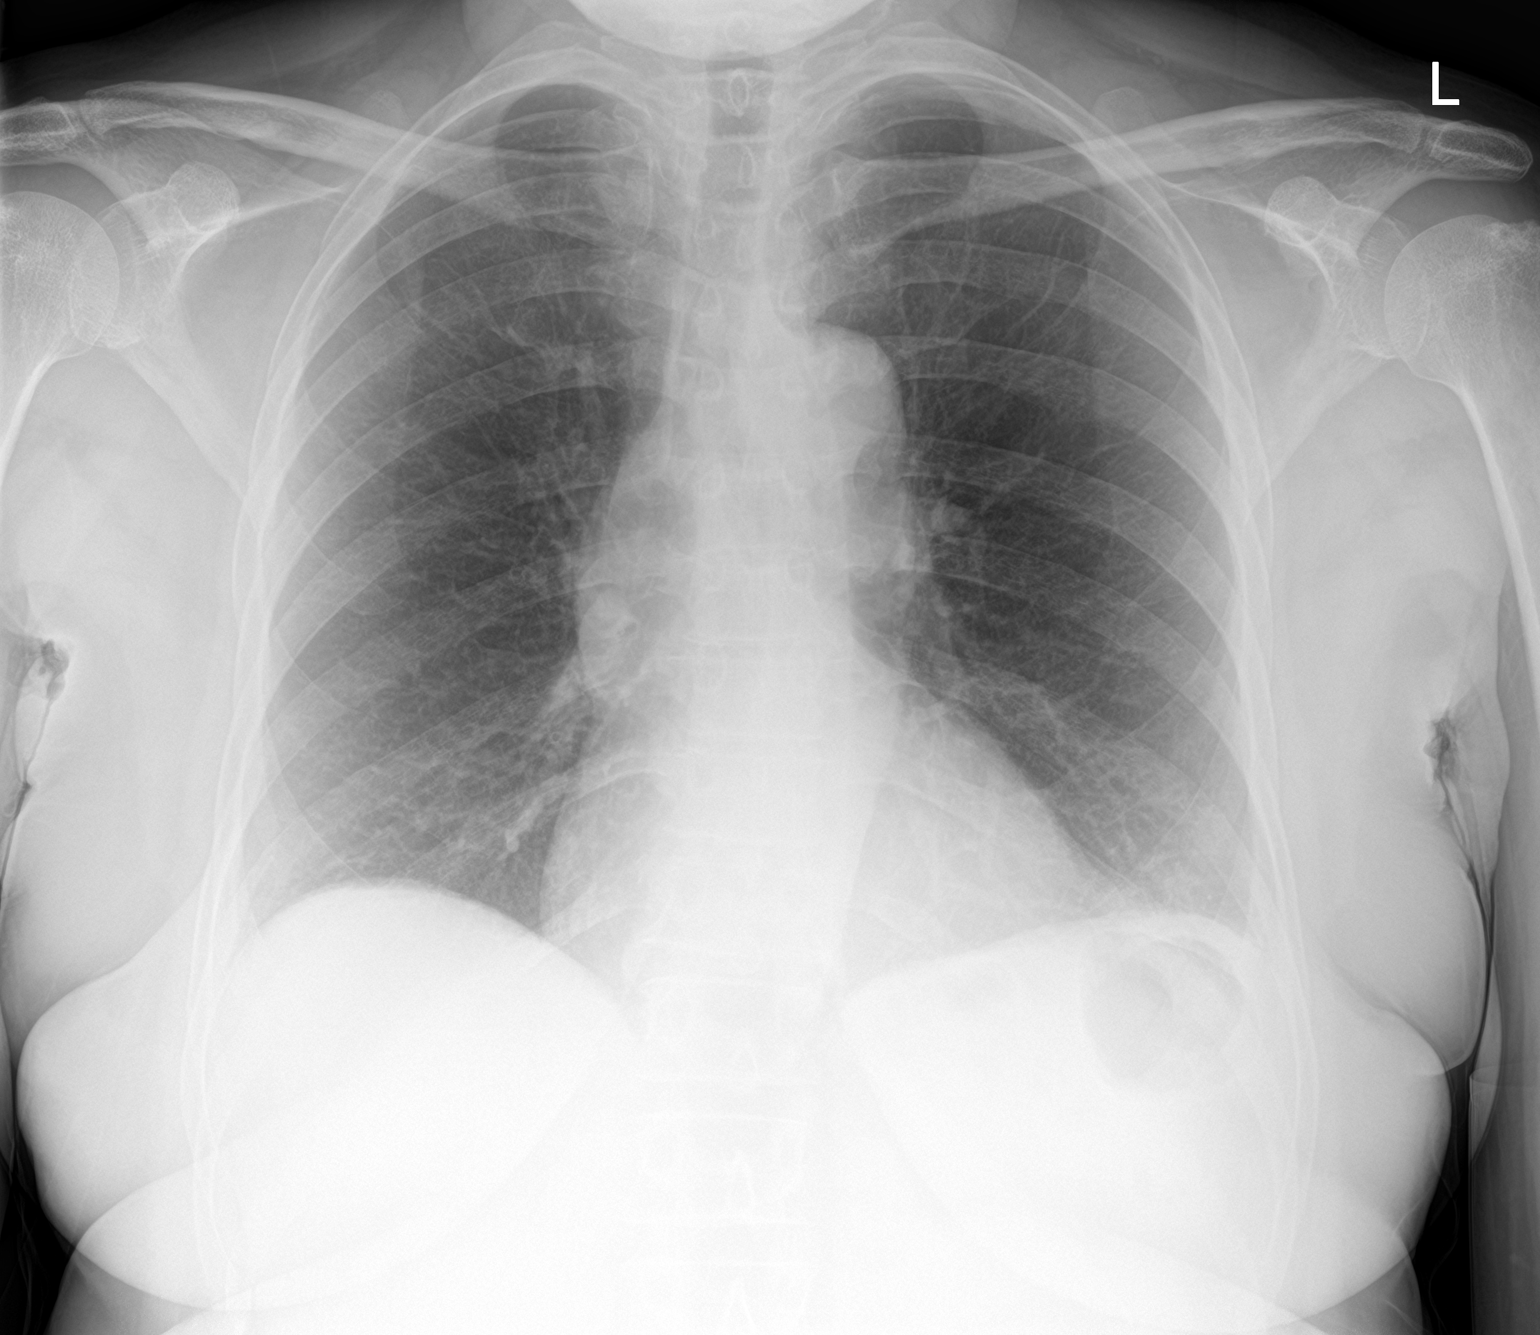

[2 of 2 positions shown; findings below may reference images not displayed]

FINDINGS: Unchanged cardiomediastinal silhouette. There is no new focal
airspace disease. There is no large pleural effusion or visible
pneumothorax. There is no acute osseous abnormality. Thoracic
spondylosis.
IMPRESSION: No focal airspace disease.

## 2021-07-25 MED ORDER — SODIUM CHLORIDE 0.9 % IV BOLUS
500.0000 mL | Freq: Once | INTRAVENOUS | Status: AC
Start: 2021-07-25 — End: 2021-07-25
  Administered 2021-07-25: 500 mL via INTRAVENOUS

## 2021-07-25 MED ORDER — METOCLOPRAMIDE HCL 5 MG/ML IJ SOLN
10.0000 mg | Freq: Once | INTRAMUSCULAR | Status: AC
  Administered 2021-07-25: 10 mg via INTRAVENOUS
  Filled 2021-07-25: qty 2

## 2021-07-25 MED ORDER — KETOROLAC TROMETHAMINE 15 MG/ML IJ SOLN
15.0000 mg | Freq: Once | INTRAMUSCULAR | Status: AC
  Administered 2021-07-25: 15 mg via INTRAVENOUS
  Filled 2021-07-25: qty 1

## 2021-07-25 MED ORDER — DIPHENHYDRAMINE HCL 50 MG/ML IJ SOLN
12.5000 mg | Freq: Once | INTRAMUSCULAR | Status: AC
  Administered 2021-07-25: 12.5 mg via INTRAVENOUS
  Filled 2021-07-25: qty 1

## 2021-07-25 NOTE — ED Provider Notes (Signed)
Emergency Medicine Provider Triage Evaluation Note  Angelys Yetman , a 55 y.o. female  was evaluated in triage.  Pt complains of Persistent headache cough, congestion, fatigue.  Denies any chest pain or shortness of breath.  He is vaccinated against COVID-19  Review of Systems  Positive: Headache, cough, congestion Negative: Fever  Physical Exam  BP 113/80 (BP Location: Left Arm)   Pulse (!) 104   Temp 98.1 F (36.7 C) (Oral)   Resp 17   SpO2 100%  Gen:   Awake, no distress, speaking full sentences Resp:  Normal effort  MSK:   Moves extremities without difficulty  Other:  Abdomen soft nontender  Medical Decision Making  Medically screening exam initiated at 11:59 AM.  Appropriate orders placed.  Fe Okubo was informed that the remainder of the evaluation will be completed by another provider, this initial triage assessment does not replace that evaluation, and the importance of remaining in the ED until their evaluation is complete.    Pati Gallo N[place], [place] 07/25/21 1208    Elnora Morrison, MD 07/26/21 [phone]

## 2021-07-25 NOTE — ED Provider Notes (Signed)
[facility] EMERGENCY DEPARTMENT Provider Note   CSN: [id number] Arrival date & time: 07/25/21  1125     History Chief Complaint  Patient presents with   Headache    Denise Knight is a 55 y.o. female.  With past medical history of asthma, hypertension, cocaine abuse, allergies who presented to the emergency department for headache.  States that her headache began 3 days ago.  Describes it as initially a frontal headache that has now become global.  Describes rather sudden onset; however, pain severity has waxed and waned since then.  She describes it as constant, throbbing.  Not associated with photophobia or phonophobia.  States that she is tried Excedrin headache, Tylenol without relief of symptoms.  Endorsed an episode of nausea  vomiting yesterday and one episode of diarrhea without recurrence.  Denies visual changes.  Denies syncope, numbness or tingling in her extremities, headedness, dizziness, fever, trauma to the head, falls.   Since having headache she has endorsed onset of congestion, left sinus pressure, rhinorrhea, sore throat, dry cough (although attributes this to her asthma).  She denies chest pain, shortness of breath, abdominal pain, urinary symptoms.   Headache Associated symptoms: congestion, cough, fatigue and nausea   Associated symptoms: no dizziness, no fever, no neck pain, no neck stiffness, no numbness, no photophobia and no sore throat       Past Medical History:  Diagnosis Date   Allergy    seasonal   Anxiety    Arthritis    "left knee" (07/05/2016)   Asthma    Bipolar 1 disorder (HCC)    Depression    Diabetes mellitus without complication (HCC)    pre-   GERD (gastroesophageal reflux disease)    Hypertension    MI (mitral incompetence)    Post traumatic stress disorder    Schizophrenia ([place])    Stomach ulcer    Substance abuse ([place])    cocaine quit july 2020   Suicide attempt [facility])     Patient Active Problem List    Diagnosis Date Noted   MDD (major depressive disorder) 01/12/2020   Severe recurrent major depression without psychotic features ([place]) 01/12/2020   Alcohol use disorder, moderate, dependence ([place]) 01/12/2020   MDD (major depressive disorder), recurrent, severe, with psychosis ([place]) 06/01/2019   Cocaine abuse ([place]) 12/27/2017   Rectal bleeding 12/27/2017   NSTEMI (non-ST elevated myocardial infarction) ([place]) 12/23/2017   Acute viral bronchitis 06/14/2017   Asthma exacerbation 06/14/2017   Chest pain 07/05/2016    Past Surgical History:  Procedure Laterality Date   CARDIAC CATHETERIZATION  07/05/2016   CARDIAC CATHETERIZATION N/A 07/05/2016   Procedure: Left Heart Cath and Coronary Angiography;  Surgeon: Adrian Prows, MD;  Location: [place] [facility];  Service: Cardiovascular;  Laterality: N/A;   CYST EXCISION Right    "wrist"   DILATION AND CURETTAGE OF UTERUS     FOOT SURGERY Bilateral    "corns removed"   LEFT HEART CATH AND CORONARY ANGIOGRAPHY N/A 12/24/2017   Procedure: LEFT HEART CATH AND CORONARY ANGIOGRAPHY;  Surgeon: Nigel Mormon, MD;  Location: [place] [facility];  Service: Cardiovascular;  Laterality: N/A;   TUBAL LIGATION       OB History   No obstetric history on file.     Family History  Problem Relation Age of Onset   Breast cancer Other    Lung cancer Other    Congestive Heart Failure Other    Diabetes Other  Hypertension Other    Colon cancer Neg Hx    Colon polyps Neg Hx    Esophageal cancer Neg Hx    Stomach cancer Neg Hx    Rectal cancer Neg Hx     Social History   Tobacco Use   Smoking status: Some Days    Packs/day: 0.25    Years: 35.00    Pack years: 8.75    Types: Cigarettes    Last attempt to quit: 05/30/2019    Years since quitting: 2.1   Smokeless tobacco: Never  Vaping Use   Vaping Use: Never used  Substance Use Topics   Alcohol use: Yes    Alcohol/week: 7.0 standard drinks    Types: 7 Cans of beer per week   Drug use:  Yes    Types: Marijuana    Home Medications Prior to Admission medications   Medication Sig Start Date End Date Taking? Authorizing Provider  albuterol (VENTOLIN HFA) 108 (90 Base) MCG/ACT inhaler Inhale 1-2 puffs into the lungs every 6 (six) hours as needed for wheezing or shortness of breath. 10/13/20   Loura Halt A, NP  cetirizine (ZYRTEC) 10 MG tablet Take 1 tablet (10 mg total) by mouth daily. 10/13/20   Loura Halt A, NP  FLUoxetine (PROZAC) 20 MG capsule Take 20 mg by mouth daily. Patient not taking: Reported on 12/05/2020    [provider]  fluticasone (FLONASE) 50 MCG/ACT nasal spray Place 1 spray into both nostrils daily. 10/13/20   Loura Halt A, NP  guaiFENesin (MUCINEX) 600 MG 12 hr tablet Take 600 mg by mouth 2 (two) times daily. Patient not taking: Reported on 12/05/2020    [provider]  guaifenesin (ROBITUSSIN) 100 MG/5ML syrup Take 5-10 mLs (100-200 mg total) by mouth every 4 (four) hours as needed for cough. Patient not taking: Reported on 12/05/2020 10/13/20   Loura Halt A, NP  HYDROcodone-acetaminophen (NORCO) 5-325 MG tablet Take 1 tablet by mouth every 6 (six) hours as needed. 04/09/21   Jacqlyn Larsen, PA-C  methocarbamol (ROBAXIN) 500 MG tablet Take 1 tablet (500 mg total) by mouth 2 (two) times daily. 04/09/21   Jacqlyn Larsen, PA-C  ondansetron (ZOFRAN-ODT) 4 MG disintegrating tablet Take 1 tablet (4 mg total) by mouth every 8 (eight) hours as needed for nausea or vomiting. 01/10/21   Davonna Belling, MD  pantoprazole (PROTONIX) 40 MG tablet Take 1 tablet (40 mg total) by mouth daily. 01/18/20   Connye Burkitt, NP  prazosin (MINIPRESS) 1 MG capsule Take 1 mg by mouth at bedtime. 10/14/20   [provider]  sertraline (ZOLOFT) 25 MG tablet Take 3 tablets (75 mg total) by mouth daily. 01/18/20   Connye Burkitt, NP  traZODone (DESYREL) 100 MG tablet Take 2 tablets (200 mg total) by mouth at bedtime as needed for sleep. 01/17/20   Connye Burkitt, NP   amantadine (SYMMETREL) 100 MG capsule Take 1 capsule (100 mg total) by mouth 2 (two) times daily. 01/17/20 10/13/20  Connye Burkitt, NP    Allergies    Aspirin, Food, and Morphine and related  Review of Systems   Review of Systems  Constitutional:  Positive for fatigue. Negative for fever.  HENT:  Positive for congestion and sinus pain. Negative for sore throat.   Eyes:  Negative for photophobia and visual disturbance.  Respiratory:  Positive for cough.   Gastrointestinal:  Positive for nausea.  Musculoskeletal:  Negative for neck pain and neck stiffness.  Neurological:  Positive for headaches. Negative for dizziness, syncope, light-headedness and numbness.  All other systems reviewed and are negative.  Physical Exam Updated Vital Signs BP 106/85 (BP Location: Left Arm)   Pulse 91   Temp 98.1 F (36.7 C) (Oral)   Resp 15   SpO2 96%   Physical Exam Vitals and nursing note reviewed.  Constitutional:      General: She is not in acute distress.    Appearance: Normal appearance. She is well-developed.  HENT:     Head: Normocephalic and atraumatic.     Nose: Congestion and rhinorrhea present.     Mouth/Throat:     Mouth: Mucous membranes are moist.     Pharynx: Oropharynx is clear.  Eyes:     General: No scleral icterus.    Extraocular Movements: Extraocular movements intact.     Right eye: No nystagmus.     Left eye: No nystagmus.     Pupils: Pupils are equal, round, and reactive to light.     Comments: Photophobia bilaterally  Neck:     Meningeal: Brudzinski's sign absent.  Cardiovascular:     Rate and Rhythm: Normal rate and regular rhythm.     Pulses: Normal pulses.     Heart sounds: Normal heart sounds. No murmur heard. Pulmonary:     Effort: Pulmonary effort is normal. No respiratory distress.     Breath sounds: Normal breath sounds.  Abdominal:     General: Bowel sounds are normal.     Palpations: Abdomen is soft.     Tenderness: There is no abdominal  tenderness.  Musculoskeletal:        General: Normal range of motion.     Cervical back: Normal range of motion and neck supple. No rigidity. No pain with movement.     Right lower leg: No edema.     Left lower leg: No edema.  Skin:    General: Skin is warm and dry.     Capillary Refill: Capillary refill takes less than 2 seconds.  Neurological:     General: No focal deficit present.     Mental Status: She is alert and oriented to person, place, and time. Mental status is at baseline.     GCS: GCS eye subscore is 4. GCS verbal subscore is 5. GCS motor subscore is 6.     Cranial Nerves: Cranial nerves are intact. No cranial nerve deficit or facial asymmetry.     Sensory: Sensation is intact. No sensory deficit.     Motor: Motor function is intact. No weakness.     Coordination: Finger-Nose-Finger Test normal.  Psychiatric:        Mood and Affect: Mood normal.        Speech: Speech normal.        Behavior: Behavior normal.    ED Results / Procedures / Treatments   Labs (all labs ordered are listed, but only abnormal results are displayed) Labs Reviewed  COMPREHENSIVE METABOLIC PANEL - Abnormal; Notable for the following components:      Result Value   Potassium 3.3 (*)    Glucose, Bld 132 (*)    Creatinine, Ser 1.24 (*)    GFR, Estimated 51 (*)    All other components within normal limits  CBC WITH DIFFERENTIAL/PLATELET - Abnormal; Notable for the following components:   RBC 5.34 (*)    All other components within normal limits  RESP PANEL BY RT-PCR (FLU A&B, COVID) ARPGX2  URINALYSIS, ROUTINE W REFLEX MICROSCOPIC  EKG None  Radiology DG Chest 2 View  Result Date: 07/25/2021 CLINICAL DATA:  Shortness of breath/cough EXAM: CHEST - 2 VIEW COMPARISON:  Radiograph 01/10/2021 FINDINGS: Unchanged cardiomediastinal silhouette. There is no new focal airspace disease. There is no large pleural effusion or visible pneumothorax. There is no acute osseous abnormality. Thoracic  spondylosis. IMPRESSION: No focal airspace disease. Electronically Signed   By: Maurine Simmering M.D.   On: 07/25/2021 12:45    Procedures Procedures   Medications Ordered in ED Medications  sodium chloride 0.9 % bolus 500 mL (has no administration in time range)  ketorolac (TORADOL) 15 MG/ML injection 15 mg (has no administration in time range)  metoCLOPramide (REGLAN) injection 10 mg (has no administration in time range)  diphenhydrAMINE (BENADRYL) injection 12.5 mg (has no administration in time range)    ED Course  I have reviewed the triage vital signs and the nursing notes.  Pertinent labs & imaging results that were available during my care of the patient were reviewed by me and considered in my medical decision making (see chart for details).    MDM Rules/Calculators/A&P Saraya Tirey is a 55 year old female with history, HPI and PE as described above. Initial labs unremarkable. Chest x-ray without acute cardiopulmonary findings. Given abortive headache medication including Toradol, Reglan, Benadryl and 500 mL fluid bolus.  Her primary complaint is headache sounds most consistent with a migraine or sinus headache.  She does not have headache red flags.  Her neurological exam is without evidence of meningismus, altered mental status, focal neurological findings.  Due to this doubt meningitis, encephalitis, stroke.  Her presentation is not consistent with acute intracranial bleed including subarachnoid hemorrhage.  There is no history of trauma so doubt intracranial hemorrhage as well.  Given history and physical there is no temporal artery tenderness or cord felt on palpation. Given headache cocktail and on reassessment has improvement in symptoms. COVID, flu negative  This is likely a viral syndrome given the upper respiratory symptoms along with headache.  Aborted headache with headache cocktail here in the emergency department.  Also given fluid resuscitation.  Spoke with the  patient about readiness for discharge.  She is agreeable to be discharged at this time.  Discussed following with primary care provider.  She is hemodynamically stable. Final Clinical Impression(s) / ED Diagnoses Final diagnoses:  Sinus headache  Acute non intractable tension-type headache    Rx / DC Orders ED Discharge Orders     None        Mickie Hillier, PA-C 07/25/21 1839    Tegeler, Gwenyth Allegra, MD 07/25/21 1845

## 2021-07-25 NOTE — ED Triage Notes (Signed)
Patient complains of persistent headache x3 days and cough that started on Saturday. Patient denies fevers, reports taking tylenol for headache without relief. Patient alert, oriented, and in no apparent distress at this time.

## 2021-07-25 NOTE — Discharge Instructions (Addendum)
You were seen in the emergency department today for headache and upper respiratory symptoms including cough, runny nose, sinus pressure.  While you were here we gave you some medications for headache which improved your symptoms.  There is no indication to get imaging of your head at this time.  This is likely all related to a viral upper respiratory infection.  Your COVID test was negative.  Your flu test was negative.  However there are many other viruses in her environment.  Please continue to symptomatically treat yourself with Tylenol, ibuprofen, other over-the-counter decongestions.  Please follow-up with your primary care physician as needed.  You may return to emergency department for any reason.

## 2021-09-01 ENCOUNTER — Emergency Department (HOSPITAL_COMMUNITY)
Admission: EM | Admit: 2021-09-01 | Discharge: 2021-09-02 | Disposition: A | Discharge status: Other Acute Inpatient Hospital | Attending: Emergency Medicine | Admitting: Emergency Medicine

## 2021-09-01 ENCOUNTER — Other Ambulatory Visit: Payer: Self-pay

## 2021-09-01 DIAGNOSIS — R45851 Suicidal ideations: Secondary | ICD-10-CM | POA: Diagnosis not present

## 2021-09-01 DIAGNOSIS — Z79899 Other long term (current) drug therapy: Secondary | ICD-10-CM | POA: Diagnosis not present

## 2021-09-01 DIAGNOSIS — J45909 Unspecified asthma, uncomplicated: Secondary | ICD-10-CM | POA: Diagnosis not present

## 2021-09-01 DIAGNOSIS — I10 Essential (primary) hypertension: Secondary | ICD-10-CM | POA: Insufficient documentation

## 2021-09-01 DIAGNOSIS — R079 Chest pain, unspecified: Secondary | ICD-10-CM | POA: Insufficient documentation

## 2021-09-01 DIAGNOSIS — E119 Type 2 diabetes mellitus without complications: Secondary | ICD-10-CM | POA: Insufficient documentation

## 2021-09-01 DIAGNOSIS — F1420 Cocaine dependence, uncomplicated: Secondary | ICD-10-CM | POA: Diagnosis not present

## 2021-09-01 DIAGNOSIS — F332 Major depressive disorder, recurrent severe without psychotic features: Secondary | ICD-10-CM | POA: Diagnosis not present

## 2021-09-01 DIAGNOSIS — Z20822 Contact with and (suspected) exposure to covid-19: Secondary | ICD-10-CM | POA: Insufficient documentation

## 2021-09-01 DIAGNOSIS — F17210 Nicotine dependence, cigarettes, uncomplicated: Secondary | ICD-10-CM | POA: Insufficient documentation

## 2021-09-01 NOTE — ED Triage Notes (Signed)
Pt reports right sided chest pain "feels like an elephant standing on my chest" onset 2 hours PTA associated with nausea, vomiting, lightheadedness and SOB. Pt is tearful and dry heaving in triage.

## 2021-09-02 ENCOUNTER — Inpatient Hospital Stay (HOSPITAL_COMMUNITY)
Admission: AD | Admit: 2021-09-02 | Discharge: 2021-09-13 | DRG: 885 | Disposition: A | Discharge status: Home / Self Care | Source: Intra-hospital | Attending: Psychiatry | Admitting: Psychiatry

## 2021-09-02 ENCOUNTER — Encounter (HOSPITAL_COMMUNITY): Payer: Self-pay | Admitting: Urology

## 2021-09-02 ENCOUNTER — Emergency Department (HOSPITAL_COMMUNITY)

## 2021-09-02 ENCOUNTER — Other Ambulatory Visit: Payer: Self-pay

## 2021-09-02 ENCOUNTER — Encounter (HOSPITAL_COMMUNITY): Payer: Self-pay

## 2021-09-02 DIAGNOSIS — Z91410 Personal history of adult physical and sexual abuse: Secondary | ICD-10-CM

## 2021-09-02 DIAGNOSIS — F1020 Alcohol dependence, uncomplicated: Secondary | ICD-10-CM | POA: Diagnosis present

## 2021-09-02 DIAGNOSIS — Z9151 Personal history of suicidal behavior: Secondary | ICD-10-CM

## 2021-09-02 DIAGNOSIS — E7800 Pure hypercholesterolemia, unspecified: Secondary | ICD-10-CM | POA: Diagnosis present

## 2021-09-02 DIAGNOSIS — R946 Abnormal results of thyroid function studies: Secondary | ICD-10-CM | POA: Diagnosis not present

## 2021-09-02 DIAGNOSIS — F332 Major depressive disorder, recurrent severe without psychotic features: Secondary | ICD-10-CM | POA: Diagnosis not present

## 2021-09-02 DIAGNOSIS — D649 Anemia, unspecified: Secondary | ICD-10-CM | POA: Diagnosis present

## 2021-09-02 DIAGNOSIS — E7220 Disorder of urea cycle metabolism, unspecified: Secondary | ICD-10-CM | POA: Diagnosis present

## 2021-09-02 DIAGNOSIS — F4310 Post-traumatic stress disorder, unspecified: Secondary | ICD-10-CM | POA: Diagnosis present

## 2021-09-02 DIAGNOSIS — Z803 Family history of malignant neoplasm of breast: Secondary | ICD-10-CM | POA: Diagnosis not present

## 2021-09-02 DIAGNOSIS — E871 Hypo-osmolality and hyponatremia: Secondary | ICD-10-CM | POA: Diagnosis present

## 2021-09-02 DIAGNOSIS — R300 Dysuria: Secondary | ICD-10-CM | POA: Diagnosis present

## 2021-09-02 DIAGNOSIS — I10 Essential (primary) hypertension: Secondary | ICD-10-CM | POA: Diagnosis present

## 2021-09-02 DIAGNOSIS — Z8249 Family history of ischemic heart disease and other diseases of the circulatory system: Secondary | ICD-10-CM | POA: Diagnosis not present

## 2021-09-02 DIAGNOSIS — R9431 Abnormal electrocardiogram [ECG] [EKG]: Secondary | ICD-10-CM | POA: Diagnosis present

## 2021-09-02 DIAGNOSIS — Z62810 Personal history of physical and sexual abuse in childhood: Secondary | ICD-10-CM | POA: Diagnosis present

## 2021-09-02 DIAGNOSIS — F1420 Cocaine dependence, uncomplicated: Secondary | ICD-10-CM | POA: Diagnosis present

## 2021-09-02 DIAGNOSIS — F3181 Bipolar II disorder: Secondary | ICD-10-CM | POA: Diagnosis present

## 2021-09-02 DIAGNOSIS — K5900 Constipation, unspecified: Secondary | ICD-10-CM | POA: Diagnosis present

## 2021-09-02 DIAGNOSIS — M7989 Other specified soft tissue disorders: Secondary | ICD-10-CM | POA: Diagnosis present

## 2021-09-02 DIAGNOSIS — D72829 Elevated white blood cell count, unspecified: Secondary | ICD-10-CM | POA: Diagnosis not present

## 2021-09-02 DIAGNOSIS — F1220 Cannabis dependence, uncomplicated: Secondary | ICD-10-CM | POA: Diagnosis present

## 2021-09-02 DIAGNOSIS — Z23 Encounter for immunization: Secondary | ICD-10-CM | POA: Diagnosis not present

## 2021-09-02 DIAGNOSIS — Z801 Family history of malignant neoplasm of trachea, bronchus and lung: Secondary | ICD-10-CM

## 2021-09-02 DIAGNOSIS — E559 Vitamin D deficiency, unspecified: Secondary | ICD-10-CM | POA: Diagnosis present

## 2021-09-02 DIAGNOSIS — Z833 Family history of diabetes mellitus: Secondary | ICD-10-CM

## 2021-09-02 DIAGNOSIS — F1410 Cocaine abuse, uncomplicated: Secondary | ICD-10-CM | POA: Diagnosis present

## 2021-09-02 DIAGNOSIS — J449 Chronic obstructive pulmonary disease, unspecified: Secondary | ICD-10-CM | POA: Diagnosis present

## 2021-09-02 DIAGNOSIS — B3731 Acute candidiasis of vulva and vagina: Secondary | ICD-10-CM | POA: Diagnosis present

## 2021-09-02 DIAGNOSIS — E038 Other specified hypothyroidism: Secondary | ICD-10-CM | POA: Diagnosis present

## 2021-09-02 DIAGNOSIS — G4700 Insomnia, unspecified: Secondary | ICD-10-CM | POA: Diagnosis present

## 2021-09-02 DIAGNOSIS — Z79899 Other long term (current) drug therapy: Secondary | ICD-10-CM

## 2021-09-02 DIAGNOSIS — R079 Chest pain, unspecified: Secondary | ICD-10-CM | POA: Diagnosis present

## 2021-09-02 DIAGNOSIS — R42 Dizziness and giddiness: Secondary | ICD-10-CM | POA: Diagnosis present

## 2021-09-02 DIAGNOSIS — Z91199 Patient's noncompliance with other medical treatment and regimen due to unspecified reason: Secondary | ICD-10-CM

## 2021-09-02 DIAGNOSIS — E785 Hyperlipidemia, unspecified: Secondary | ICD-10-CM | POA: Diagnosis present

## 2021-09-02 DIAGNOSIS — K649 Unspecified hemorrhoids: Secondary | ICD-10-CM | POA: Diagnosis present

## 2021-09-02 DIAGNOSIS — Z751 Person awaiting admission to adequate facility elsewhere: Secondary | ICD-10-CM

## 2021-09-02 DIAGNOSIS — F333 Major depressive disorder, recurrent, severe with psychotic symptoms: Secondary | ICD-10-CM | POA: Diagnosis present

## 2021-09-02 LAB — CBC
HCT: 40.4 % (ref 36.0–46.0)
Hemoglobin: 13.4 g/dL (ref 12.0–15.0)
MCH: 27.3 pg (ref 26.0–34.0)
MCHC: 33.2 g/dL (ref 30.0–36.0)
MCV: 82.3 fL (ref 80.0–100.0)
Platelets: 309 10*3/uL (ref 150–400)
RBC: 4.91 MIL/uL (ref 3.87–5.11)
RDW: 16.7 % — ABNORMAL HIGH (ref 11.5–15.5)
WBC: 13.1 10*3/uL — ABNORMAL HIGH (ref 4.0–10.5)
nRBC: 0 % (ref 0.0–0.2)

## 2021-09-02 LAB — URINALYSIS, ROUTINE W REFLEX MICROSCOPIC
Bilirubin Urine: NEGATIVE
Glucose, UA: NEGATIVE mg/dL
Ketones, ur: NEGATIVE mg/dL
Nitrite: NEGATIVE
Protein, ur: 100 mg/dL — AB
Specific Gravity, Urine: 1.021 (ref 1.005–1.030)
pH: 5 (ref 5.0–8.0)

## 2021-09-02 LAB — RAPID URINE DRUG SCREEN, HOSP PERFORMED
Amphetamines: NOT DETECTED
Barbiturates: NOT DETECTED
Benzodiazepines: NOT DETECTED
Cocaine: POSITIVE — AB
Opiates: NOT DETECTED
Tetrahydrocannabinol: POSITIVE — AB

## 2021-09-02 LAB — SALICYLATE LEVEL: Salicylate Lvl: <7 mg/dL — ABNORMAL LOW (ref 7.0–30.0)

## 2021-09-02 LAB — RESP PANEL BY RT-PCR (FLU A&B, COVID) ARPGX2
Influenza A by PCR: NEGATIVE
Influenza B by PCR: NEGATIVE
SARS Coronavirus 2 by RT PCR: NEGATIVE

## 2021-09-02 LAB — BASIC METABOLIC PANEL
Anion gap: 13 (ref 5–15)
BUN: 9 mg/dL (ref 6–20)
CO2: 22 mmol/L (ref 22–32)
Calcium: 9.5 mg/dL (ref 8.9–10.3)
Chloride: 97 mmol/L — ABNORMAL LOW (ref 98–111)
Creatinine, Ser: 1.05 mg/dL — ABNORMAL HIGH (ref 0.44–1.00)
GFR, Estimated: >60 mL/min (ref >60)
Glucose, Bld: 114 mg/dL — ABNORMAL HIGH (ref 70–99)
Potassium: 4.1 mmol/L (ref 3.5–5.1)
Sodium: 132 mmol/L — ABNORMAL LOW (ref 135–145)

## 2021-09-02 LAB — I-STAT BETA HCG BLOOD, ED (MC, WL, AP ONLY): I-stat hCG, quantitative: 5.2 m[IU]/mL — ABNORMAL HIGH (ref <5)

## 2021-09-02 LAB — ETHANOL: Alcohol, Ethyl (B): <10 mg/dL (ref <10)

## 2021-09-02 LAB — ACETAMINOPHEN LEVEL: Acetaminophen (Tylenol), Serum: <10 ug/mL — ABNORMAL LOW (ref 10–30)

## 2021-09-02 LAB — TROPONIN I (HIGH SENSITIVITY)
Troponin I (High Sensitivity): 14 ng/L (ref <18)
Troponin I (High Sensitivity): 12 ng/L (ref <18)

## 2021-09-02 IMAGING — DX DG CHEST 2V
2 series · 2 of 2 positions shown · non-contrast
Comparison: [DATE]

CLINICAL DATA: Chest pain.  Right-sided pain for 2 days.

EXAM:
CHEST - 2 VIEW

[chest lat]
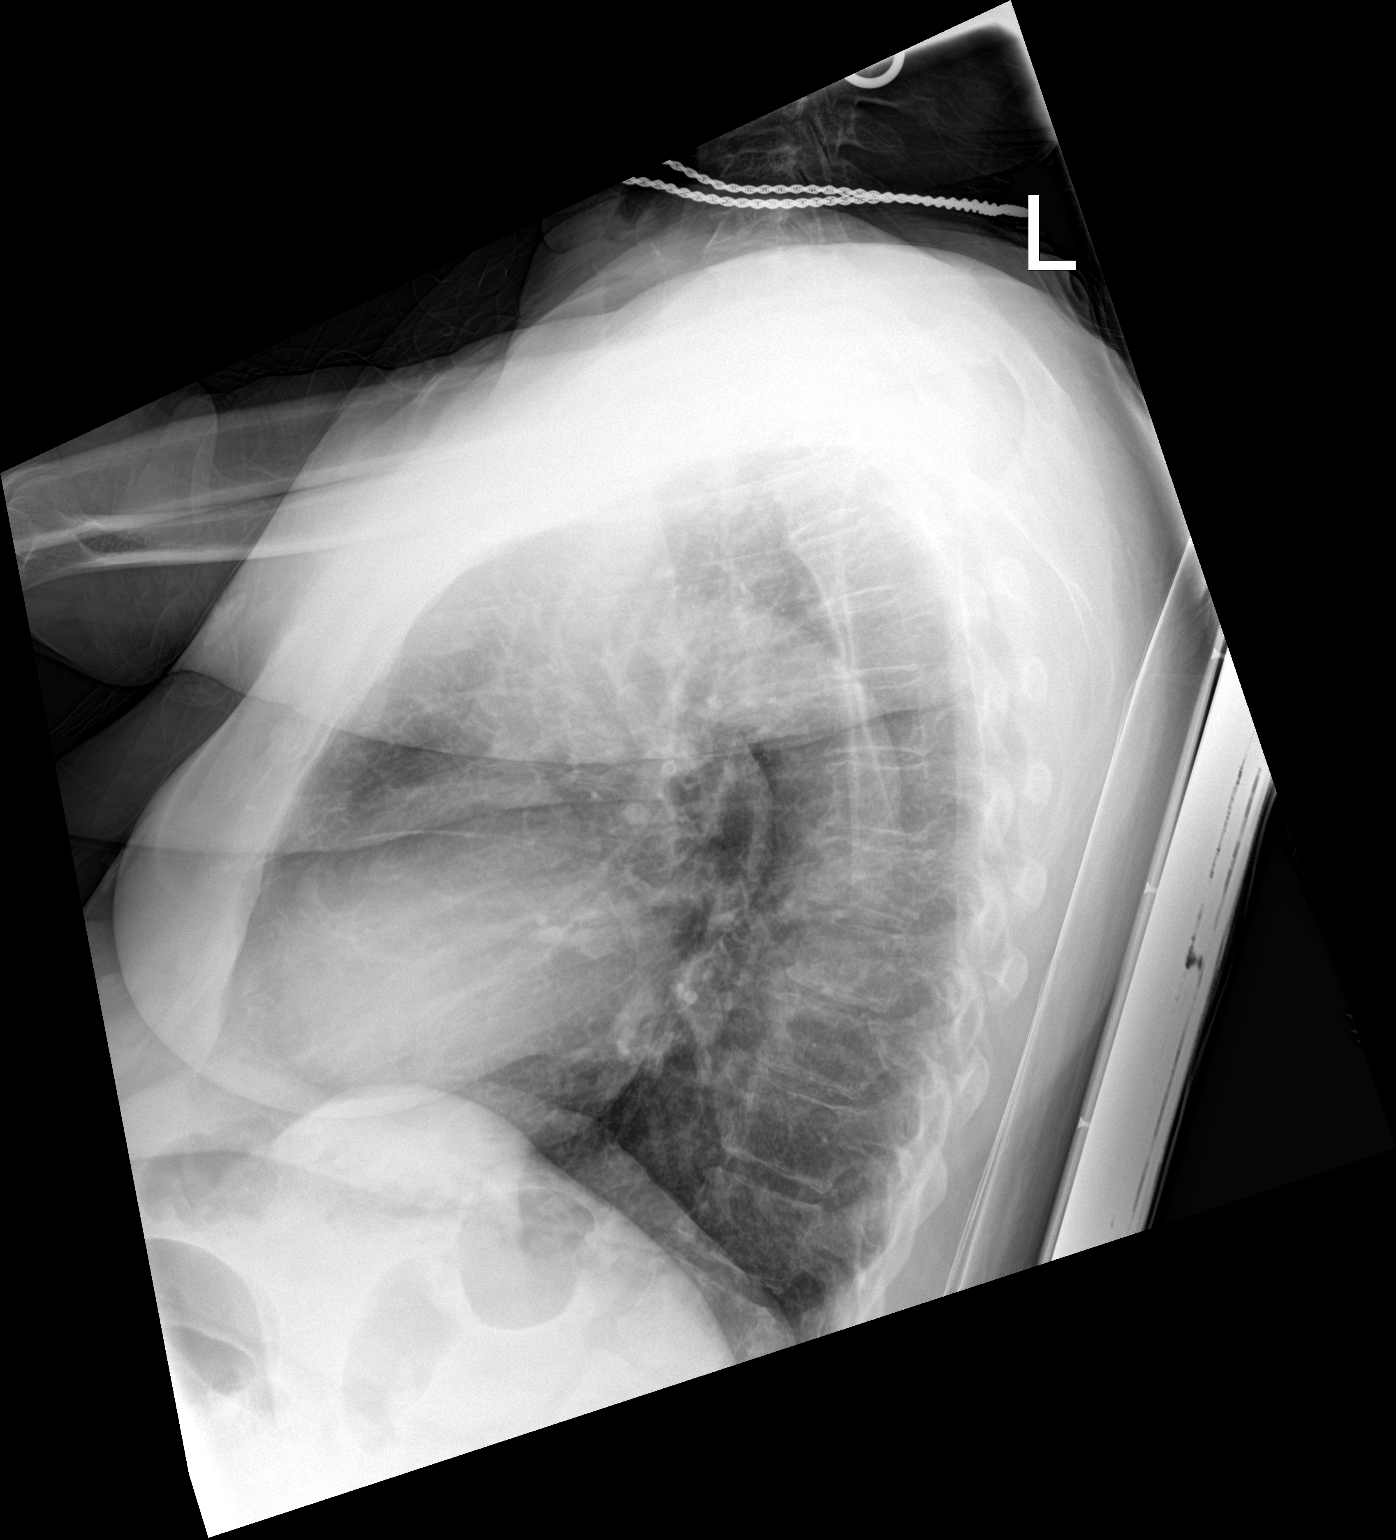

[chest ap]
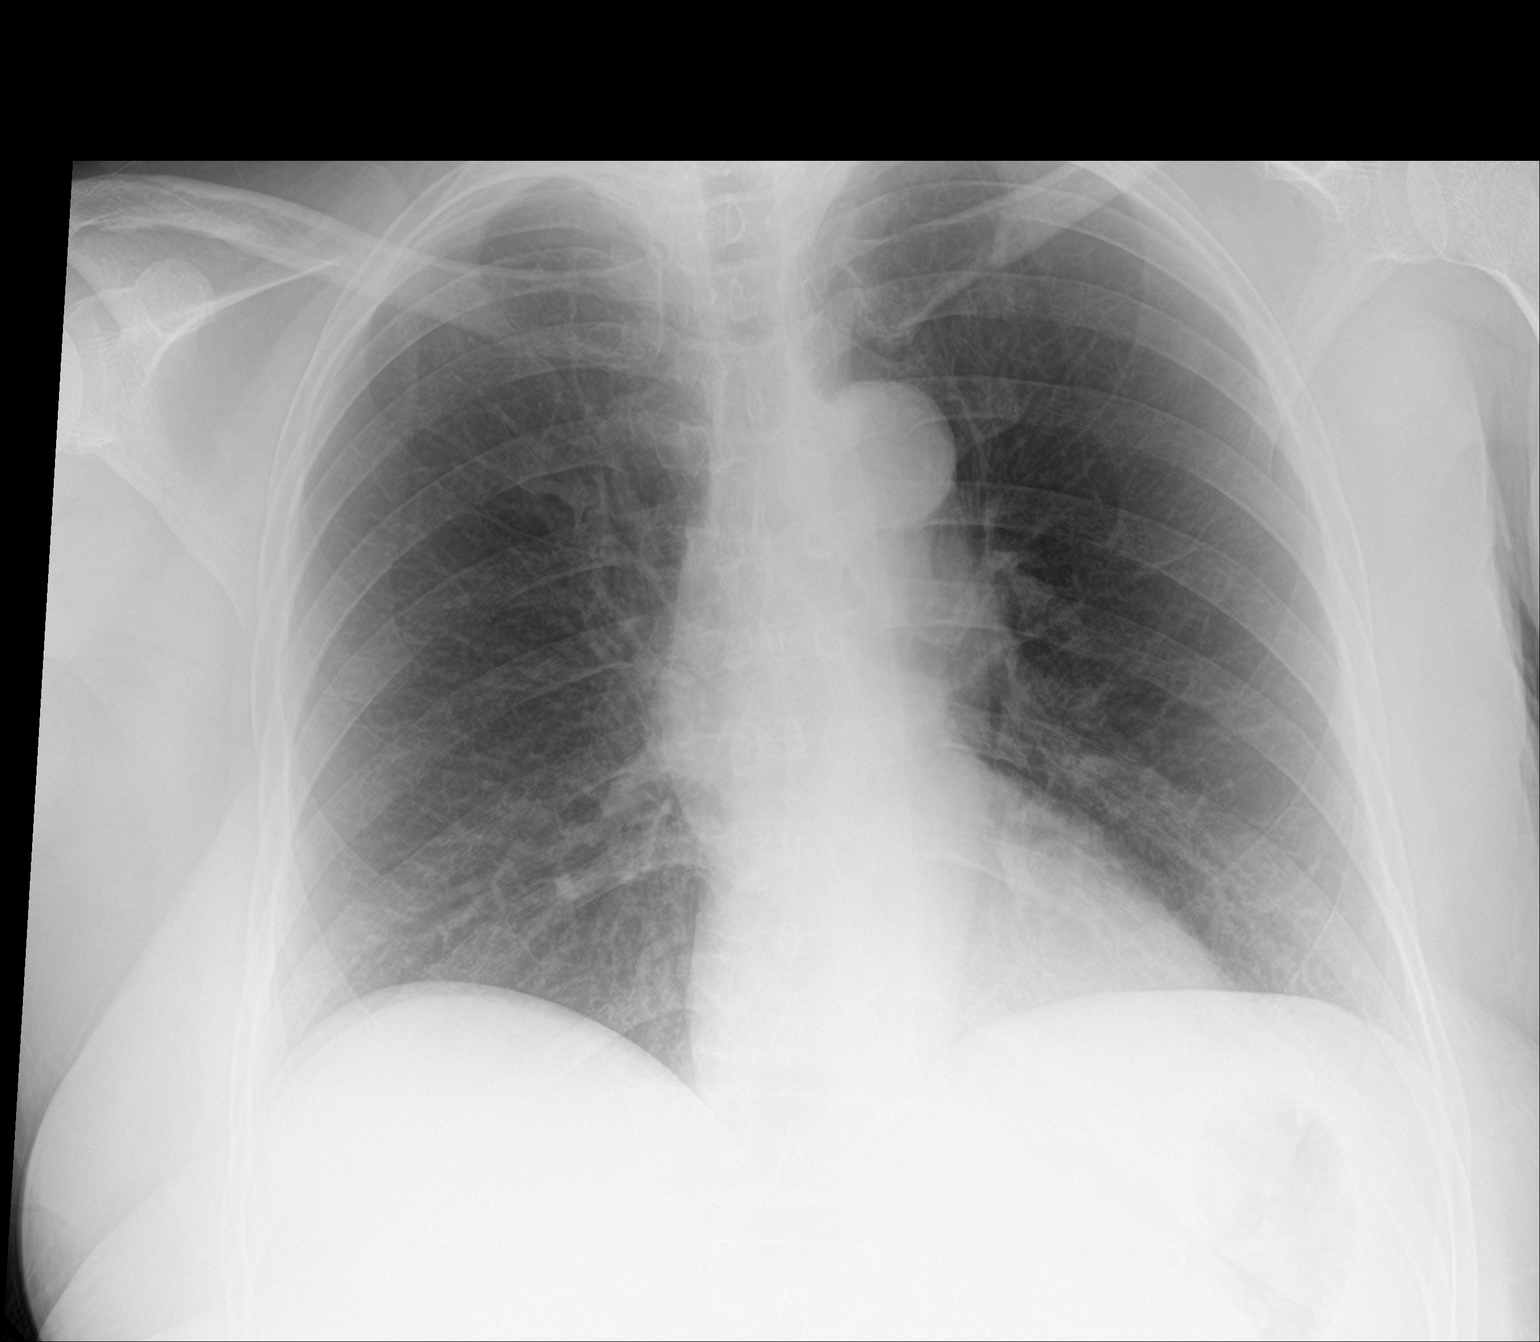

[2 of 2 positions shown; findings below may reference images not displayed]

FINDINGS: The cardiomediastinal contours are normal. The lungs are clear.
Pulmonary vasculature is normal. No consolidation, pleural effusion,
or pneumothorax. Thoracic degenerative change with mild spurring. No
acute osseous abnormalities are seen.
IMPRESSION: No acute chest findings.

## 2021-09-02 MED ORDER — TRAZODONE HCL 50 MG PO TABS
50.0000 mg | ORAL_TABLET | Freq: Every evening | ORAL | Status: DC | PRN
  Filled 2021-09-02: qty 1

## 2021-09-02 MED ORDER — PANTOPRAZOLE SODIUM 40 MG PO TBEC
40.0000 mg | DELAYED_RELEASE_TABLET | Freq: Every day | ORAL | Status: DC
  Administered 2021-09-02 – 2021-09-04 (×3): 40 mg via ORAL
  Filled 2021-09-02 (×5): qty 1

## 2021-09-02 MED ORDER — ALUM & MAG HYDROXIDE-SIMETH 200-200-20 MG/5ML PO SUSP: 30.0000 mL | ORAL | Status: DC | PRN

## 2021-09-02 MED ORDER — MIRTAZAPINE 7.5 MG PO TABS
7.5000 mg | ORAL_TABLET | Freq: Every day | ORAL | Status: DC
  Administered 2021-09-02 – 2021-09-03 (×2): 7.5 mg via ORAL
  Filled 2021-09-02 (×5): qty 1

## 2021-09-02 MED ORDER — ACETAMINOPHEN 325 MG PO TABS
650.0000 mg | ORAL_TABLET | Freq: Four times a day (QID) | ORAL | Status: DC | PRN
  Administered 2021-09-03 – 2021-09-10 (×3): 650 mg via ORAL
  Filled 2021-09-02 (×3): qty 2

## 2021-09-02 MED ORDER — MAGNESIUM HYDROXIDE 400 MG/5ML PO SUSP: 30.0000 mL | Freq: Every day | ORAL | Status: DC | PRN

## 2021-09-02 MED ORDER — INFLUENZA VAC SPLIT QUAD 0.5 ML IM SUSY
0.5000 mL | PREFILLED_SYRINGE | INTRAMUSCULAR | Status: AC
  Administered 2021-09-03: 0.5 mL via INTRAMUSCULAR
  Filled 2021-09-02: qty 0.5

## 2021-09-02 MED ORDER — ALBUTEROL SULFATE HFA 108 (90 BASE) MCG/ACT IN AERS
1.0000 | INHALATION_SPRAY | Freq: Four times a day (QID) | RESPIRATORY_TRACT | Status: DC | PRN
  Administered 2021-09-02 – 2021-09-13 (×9): 2 via RESPIRATORY_TRACT
  Filled 2021-09-02: qty 6.7

## 2021-09-02 MED ORDER — LAMOTRIGINE 25 MG PO TABS
25.0000 mg | ORAL_TABLET | Freq: Every day | ORAL | Status: DC
  Administered 2021-09-02 – 2021-09-08 (×7): 25 mg via ORAL
  Filled 2021-09-02 (×11): qty 1

## 2021-09-02 MED ORDER — HYDROXYZINE HCL 25 MG PO TABS
25.0000 mg | ORAL_TABLET | Freq: Three times a day (TID) | ORAL | Status: DC | PRN
  Administered 2021-09-02 – 2021-09-12 (×20): 25 mg via ORAL
  Filled 2021-09-02 (×20): qty 1
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
## 2021-09-02 NOTE — [facility] Group Notes (Signed)
Psychoeducational Group Note  Date: 09-02-21 Time:  1300  Group Topic/Focus:  Making Healthy Choices:   The focus of this group is to help patients identify negative/unhealthy choices they were using prior to admission and identify positive/healthier coping strategies to replace them upon discharge.In this group, patients started asking about the brain and how the brain works with and how the chemicals work for those who use substances, the pros and cons of saboxone.  Participation Level:  Active  Participation Quality:  Appropriate  Affect:  Appropriate  Cognitive:  Oriented  Insight:  Improving  Engagement in Group:  Engaged  Additional Comments:Pt states her energy level is low at a 0/10. Spent the group listening, not sharing. Pt did not feel well at all but was making the effort to come and be a part of the group.  Paulino Rily

## 2021-09-02 NOTE — ED Notes (Signed)
Pt's belongings inventoried and placed in locker 12 (2 bags), valuables (Jewelry, phone, wallet) placed with security.

## 2021-09-02 NOTE — H&P (Addendum)
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 ordered  Chest Pain -Likely musculoskeletal in nature -Today's QTC 447. -We will continue to monitor  Hypercholesteremia -Lipid panel ordered, likely will recommend PCP follow-up should lipid panel returned abnormal  Asthma -Albuterol inhaler prn for wheezing/shortness of breath  HTN -Last blood pressure: 107/59 -We will continue to monitor.  May add antihypertensive should patient's vitals be unstable.  Vertigo -We will continue to monitor -Recommend PCP follow-up to monitor vertigo  PRNs Tylenol 650 mg for mild pain Maalox/Mylanta 30 mL for indigestion Bendaryl q6hr for  itching/allergies Hydroxyzine 25 mg tid for anxiety Milk of Magnesia 30 mL for constipation  3. Safety and Monitoring: Voluntary admission to inpatient psychiatric unit for safety, stabilization and treatment Daily contact with patient to assess and evaluate symptoms and progress in treatment Patient's case to be discussed in multi-disciplinary team meeting Observation Level : q15 minute checks Vital signs: q12 hours Precautions: suicide, elopement, and assault   4. Discharge Planning: Social work and case management to assist with discharge planning and identification of hospital follow-up needs prior to discharge Estimated LOS: 5-7 days Discharge Concerns: Need to establish a safety plan; Medication compliance and effectiveness Discharge Goals: Return home with outpatient referrals for mental health follow-up including medication management/psychotherapy  Observation Level/Precautions:  15 minute checks  Laboratory:  CBC Chemistry Profile HbAIC UA  Psychotherapy:    Medications:    Consultations:    Discharge Concerns:    Estimated LOS:  Other:     Physician Treatment Plan for Primary Diagnosis: <principal problem not specified> Long Term Goal(s): Improvement in symptoms so as ready for discharge  Short Term Goals: Ability to identify changes in lifestyle to reduce recurrence of condition will improve, Ability to verbalize feelings will improve, Ability to disclose and discuss suicidal ideas, Ability to demonstrate self-control will improve, Ability to identify and develop effective coping behaviors will improve, Ability to  maintain clinical measurements within normal limits will improve, Compliance with prescribed medications will improve, and Ability to identify triggers associated with substance abuse/mental health issues will improve  Physician Treatment Plan for Secondary Diagnosis: Active Problems:   Cocaine abuse (HCC)   Alcohol use disorder, moderate, dependence (HCC)    Cocaine use disorder, severe, dependence (HCC)   Long QT interval   Cannabis dependence with current use ([place])  Long Term Goal(s): Improvement in symptoms so as ready for discharge  Short Term Goals: Ability to identify changes in lifestyle to reduce recurrence of condition will improve, Ability to verbalize feelings will improve, Ability to disclose and discuss suicidal ideas, Ability to demonstrate self-control will improve, Ability to identify and develop effective coping behaviors will improve, Ability to maintain clinical measurements within normal limits will improve, Compliance with prescribed medications will improve, and Ability to identify triggers associated with substance abuse/mental health issues will improve  I certify that inpatient services furnished can reasonably be expected to improve the patient's condition.    France Ravens, MD 10/23/20224:48 PM

## 2021-09-02 NOTE — ED Provider Notes (Signed)
[facility] EMERGENCY DEPARTMENT Provider Note   CSN: [id number] Arrival date & time: 09/01/21  2342     History Chief Complaint  Patient presents with   Chest Pain   Suicidal    Denise Knight is a 55 y.o. female.  The history is provided by the patient and medical records.  Chest Pain  55 y.o. F with hx of anxiety, arthritis, asthma, bipolar disorder, depression, GERD, schizophrenia, PTSD, substance abuse, presenting to the ED chest pain.  Patient states this has been ongoing for about 2 hours.  States it feels like muscle spasms that she has had before.  States when she tries to take a deep breath it hurts and chest is very tender to the touch.  She denies any known cardiac history.  She does smoke most days but not every day.  Also during triage process, reported suicidal ideation.  States she has felt this way for a few days now.  She has a plan to overdose on pills.  She denies any ingestion today but reports 3 days ago she did take a whole bottle of Aleve.  She has not had any stomach pain or vomiting afterwards.  She denies any recent incident to cause her increased stress or depression.  She does have history of SI attempts in the past.  She denies any homicidal ideation.  No hallucinations.  Denies any illicit drug or alcohol abuse.  Past Medical History:  Diagnosis Date   Allergy    seasonal   Anxiety    Arthritis    "left knee" (07/05/2016)   Asthma    Bipolar 1 disorder ([place])    Depression    Diabetes mellitus without complication (HCC)    pre-   GERD (gastroesophageal reflux disease)    Hypertension    MI (mitral incompetence)    Post traumatic stress disorder    Schizophrenia ([place])    Stomach ulcer    Substance abuse ([place])    cocaine quit july 2020   Suicide attempt [facility])     Patient Active Problem List   Diagnosis Date Noted   MDD (major depressive disorder) 01/12/2020   Severe recurrent major depression without psychotic  features ([place]) 01/12/2020   Alcohol use disorder, moderate, dependence ([place]) 01/12/2020   MDD (major depressive disorder), recurrent, severe, with psychosis ([place]) 06/01/2019   Cocaine abuse ([place]) 12/27/2017   Rectal bleeding 12/27/2017   NSTEMI (non-ST elevated myocardial infarction) ([place]) 12/23/2017   Acute viral bronchitis 06/14/2017   Asthma exacerbation 06/14/2017   Chest pain 07/05/2016    Past Surgical History:  Procedure Laterality Date   CARDIAC CATHETERIZATION  07/05/2016   CARDIAC CATHETERIZATION N/A 07/05/2016   Procedure: Left Heart Cath and Coronary Angiography;  Surgeon: Adrian Prows, MD;  Location: [place] [facility];  Service: Cardiovascular;  Laterality: N/A;   CYST EXCISION Right    "wrist"   DILATION AND CURETTAGE OF UTERUS     FOOT SURGERY Bilateral    "corns removed"   LEFT HEART CATH AND CORONARY ANGIOGRAPHY N/A 12/24/2017   Procedure: LEFT HEART CATH AND CORONARY ANGIOGRAPHY;  Surgeon: Nigel Mormon, MD;  Location: [place] [facility];  Service: Cardiovascular;  Laterality: N/A;   TUBAL LIGATION       OB History   No obstetric history on file.     Family History  Problem Relation Age of Onset   Breast cancer Other    Lung cancer Other    Congestive Heart Failure  Other    Diabetes Other    Hypertension Other    Colon cancer Neg Hx    Colon polyps Neg Hx    Esophageal cancer Neg Hx    Stomach cancer Neg Hx    Rectal cancer Neg Hx     Social History   Tobacco Use   Smoking status: Some Days    Packs/day: 0.25    Years: 35.00    Pack years: 8.75    Types: Cigarettes    Last attempt to quit: 05/30/2019    Years since quitting: 2.2   Smokeless tobacco: Never  Vaping Use   Vaping Use: Never used  Substance Use Topics   Alcohol use: Yes    Alcohol/week: 7.0 standard drinks    Types: 7 Cans of beer per week   Drug use: Yes    Types: Marijuana    Home Medications Prior to Admission medications   Medication Sig Start Date End Date  Taking? Authorizing Provider  albuterol (VENTOLIN HFA) 108 (90 Base) MCG/ACT inhaler Inhale 1-2 puffs into the lungs every 6 (six) hours as needed for wheezing or shortness of breath. 10/13/20   Loura Halt A, NP  cetirizine (ZYRTEC) 10 MG tablet Take 1 tablet (10 mg total) by mouth daily. 10/13/20   Loura Halt A, NP  FLUoxetine (PROZAC) 20 MG capsule Take 20 mg by mouth daily. Patient not taking: Reported on 12/05/2020    [provider]  fluticasone (FLONASE) 50 MCG/ACT nasal spray Place 1 spray into both nostrils daily. 10/13/20   Loura Halt A, NP  guaiFENesin (MUCINEX) 600 MG 12 hr tablet Take 600 mg by mouth 2 (two) times daily. Patient not taking: Reported on 12/05/2020    [provider]  guaifenesin (ROBITUSSIN) 100 MG/5ML syrup Take 5-10 mLs (100-200 mg total) by mouth every 4 (four) hours as needed for cough. Patient not taking: Reported on 12/05/2020 10/13/20   Loura Halt A, NP  HYDROcodone-acetaminophen (NORCO) 5-325 MG tablet Take 1 tablet by mouth every 6 (six) hours as needed. 04/09/21   Jacqlyn Larsen, PA-C  methocarbamol (ROBAXIN) 500 MG tablet Take 1 tablet (500 mg total) by mouth 2 (two) times daily. 04/09/21   Jacqlyn Larsen, PA-C  ondansetron (ZOFRAN-ODT) 4 MG disintegrating tablet Take 1 tablet (4 mg total) by mouth every 8 (eight) hours as needed for nausea or vomiting. 01/10/21   Davonna Belling, MD  pantoprazole (PROTONIX) 40 MG tablet Take 1 tablet (40 mg total) by mouth daily. 01/18/20   Connye Burkitt, NP  prazosin (MINIPRESS) 1 MG capsule Take 1 mg by mouth at bedtime. 10/14/20   [provider]  sertraline (ZOLOFT) 25 MG tablet Take 3 tablets (75 mg total) by mouth daily. 01/18/20   Connye Burkitt, NP  traZODone (DESYREL) 100 MG tablet Take 2 tablets (200 mg total) by mouth at bedtime as needed for sleep. 01/17/20   Connye Burkitt, NP  amantadine (SYMMETREL) 100 MG capsule Take 1 capsule (100 mg total) by mouth 2 (two) times daily. 01/17/20 10/13/20  Connye Burkitt, NP    Allergies    Aspirin, Food, and Morphine and related  Review of Systems   Review of Systems  Cardiovascular:  Positive for chest pain.  Psychiatric/Behavioral:  Positive for suicidal ideas.   All other systems reviewed and are negative.  Physical Exam Updated Vital Signs BP (!) 158/99 (BP Location: Right Arm)   Pulse 99   Temp 97.9 F (36.6 C)  Resp (!) 24   Ht 5' 8"  (1.727 m)   Wt 88.5 kg   SpO2 98%   BMI 29.65 kg/m   Physical Exam Vitals and nursing note reviewed.  Constitutional:      Appearance: She is well-developed.  HENT:     Head: Normocephalic and atraumatic.  Eyes:     Conjunctiva/sclera: Conjunctivae normal.     Pupils: Pupils are equal, round, and reactive to light.  Cardiovascular:     Rate and Rhythm: Normal rate and regular rhythm.     Heart sounds: Normal heart sounds.  Pulmonary:     Effort: Pulmonary effort is normal.     Breath sounds: Normal breath sounds.  Chest:     Comments: Reproducible pain to mid chest wall, no deformity Abdominal:     General: Bowel sounds are normal.     Palpations: Abdomen is soft.  Musculoskeletal:        General: Normal range of motion.     Cervical back: Normal range of motion.  Skin:    General: Skin is warm and dry.  Neurological:     Mental Status: She is alert and oriented to person, place, and time.  Psychiatric:     Comments: Ongoing SI with plan to OD Denies HI/AVH    ED Results / Procedures / Treatments   Labs (all labs ordered are listed, but only abnormal results are displayed) Labs Reviewed  BASIC METABOLIC PANEL - Abnormal; Notable for the following components:      Result Value   Sodium 132 (*)    Chloride 97 (*)    Glucose, Bld 114 (*)    Creatinine, Ser 1.05 (*)    All other components within normal limits  CBC - Abnormal; Notable for the following components:   WBC 13.1 (*)    RDW 16.7 (*)    All other components within normal limits  SALICYLATE LEVEL - Abnormal;  Notable for the following components:   Salicylate Lvl <1.6 (*)    All other components within normal limits  ACETAMINOPHEN LEVEL - Abnormal; Notable for the following components:   Acetaminophen (Tylenol), Serum <10 (*)    All other components within normal limits  RESP PANEL BY RT-PCR (FLU A&B, COVID) ARPGX2  ETHANOL  RAPID URINE DRUG SCREEN, HOSP PERFORMED  I-STAT BETA HCG BLOOD, ED ([facility], [facility], [facility] ONLY)  TROPONIN I (HIGH SENSITIVITY)    EKG EKG Interpretation  Date/Time:  Saturday September 01 2021 23:53:20 EDT Ventricular Rate:  112 PR Interval:  144 QRS Duration: 62 QT Interval:  376 QTC Calculation: 513 R Axis:   36 Text Interpretation: Sinus tachycardia Otherwise normal ECG Confirmed by Orpah Greek [phone]) on 09/02/2021 1:18:40 AM  Radiology DG Chest 2 View  Result Date: 09/02/2021 CLINICAL DATA:  Chest pain.  Right-sided pain for 2 days. EXAM: CHEST - 2 VIEW COMPARISON:  07/25/2021 FINDINGS: The cardiomediastinal contours are normal. The lungs are clear. Pulmonary vasculature is normal. No consolidation, pleural effusion, or pneumothorax. Thoracic degenerative change with mild spurring. No acute osseous abnormalities are seen. IMPRESSION: No acute chest findings. Electronically Signed   By: Keith Rake M.D.   On: 09/02/2021 00:54    Procedures Procedures   Medications Ordered in ED Medications - No data to display  ED Course  I have reviewed the triage vital signs and the nursing notes.  Pertinent labs & imaging results that were available during my care of the patient were reviewed by me and considered in  my medical decision making (see chart for details).    MDM Rules/Calculators/A&P                           55 year old female presenting to the ED with chest pain.  States he feels like "muscle spasms" that she has had previously.  Denies any known cardiac history.  She is an occasional smoker.  She is afebrile and nontoxic.  She does have  reproducible tenderness to the mid chest wall.  No signs of trauma or deformity.  EKG is nonischemic.  Labs are reassuring including troponin x2.  Chest x-ray is clear.  Low suspicion for ACS, PE, dissection, other acute cardiac event at this time.  Patient also reported some suicidal ideation of the past few days.  Reports 3 days ago she took a whole bottle of Aleve.  She has not had any abdominal pain or vomiting from this.  She continues to feel suicidal with plan to overdose.  She denies any ingestion today.  As patient's labs are reassuring, no evidence of toxic ingestion.  Medically cleared.  Will get TTS consult.  4:52 AM Accepted to [facility] under care of Dr. Magdalen Spatz.  Patient willing to sign voluntarily.  Safe transport called for transfer.  Final Clinical Impression(s) / ED Diagnoses Final diagnoses:  Chest pain, unspecified type  Suicidal ideation    Rx / DC Orders ED Discharge Orders     None        Larene Pickett, PA-C 09/02/21 [id number]    Orpah Greek, MD 09/02/21 [phone]

## 2021-09-02 NOTE — Progress Notes (Signed)
Patient ID: Denise Knight, female   DOB: 03/08/1966, 55 y.o.   MRN: [id number] Admission note: Patient is a 31-yo female who presented to [facility] for chest pain, depressive symptoms and suicidal ideation. Patient states she had a plan to overdose on her medication. She states she took "a whole bottle of ibuprofen on Friday." She also report crying spells, insomnia, social withdrawal and loss of interest in daily activities. She reports a prior hx of three suicide attempts. She states that she binge drinks and uses crack cocaine 2-3 times per month. She doesn't remember that last time she drank. She has multiple admissions here and at [facility]. She states she only experiences auditory/visual hallucinations when she uses. Patient continues to complain of chest pain. Notified MD of same symptoms.

## 2021-09-02 NOTE — ED Notes (Signed)
Awaiting Safe Transport-have left messages with no return of call

## 2021-09-02 NOTE — Progress Notes (Signed)
Adult Psychoeducational Group Note  Date:  09/02/2021 Time:  8:24 PM  Group Topic/Focus:  Wrap-Up Group:   The focus of this group is to help patients review their daily goal of treatment and discuss progress on daily workbooks.  Participation Level:  Active  Participation Quality:  Appropriate  Affect:  Appropriate  Cognitive:  Appropriate  Insight: Appropriate  Engagement in Group:  Engaged  Modes of Intervention:  Exploration  Additional Comments:  patient attended and participated in group tonight. She reports that today she went to group, that was the  most important thing that happen for her. Tomorrow her goal is to not think about harming herself  Debe Coder 09/02/2021, 8:24 PM

## 2021-09-02 NOTE — Tx Team (Addendum)
Initial Treatment Plan 09/02/2021 5:41 PM Jezabelle Chisolm KIC:[id number]    PATIENT STRESSORS: Financial difficulties   Health problems   Marital or family conflict     PATIENT STRENGTHS:     PATIENT IDENTIFIED PROBLEMS: "I need to get mentally stable"  "Need to work on my depressive symptoms."  Alcohol and crack cocaine abuse  Depression  Risk for suicide             DISCHARGE CRITERIA:  Adequate post-discharge living arrangements Motivation to continue treatment in a less acute level of care Need for constant or close observation no longer present  PRELIMINARY DISCHARGE PLAN: Return to previous living arrangement Return to previous work or school arrangements  PATIENT/FAMILY INVOLVEMENT: This treatment plan has been presented to and reviewed with the patient, Maday Guarino.  The patient and family have been given the opportunity to ask questions and make suggestions.  Zipporah Plants, RN 09/02/2021, 5:41 PM
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
## 2021-09-02 NOTE — ED Notes (Signed)
During the triage process, the patient revealed she was having thoughts of suicide and hurting herself. She stated she wishes she was not alive and said her thoughts were to "OD on pills" but would not disclose which pills. She denies actively attempting suicide. SI orders placed. Charge RN informed.

## 2021-09-02 NOTE — Plan of Care (Signed)
Patient admitted to unit for suicidal ideation and depression.

## 2021-09-02 NOTE — ED Notes (Signed)
Safe Transport [phone] at 7:52 and again at 8:00

## 2021-09-03 ENCOUNTER — Encounter (HOSPITAL_COMMUNITY): Payer: Self-pay

## 2021-09-03 DIAGNOSIS — E559 Vitamin D deficiency, unspecified: Secondary | ICD-10-CM | POA: Diagnosis present

## 2021-09-03 LAB — RPR: RPR Ser Ql: NONREACTIVE

## 2021-09-03 LAB — HEMOGLOBIN A1C
Hgb A1c MFr Bld: 5.7 % — ABNORMAL HIGH (ref 4.8–5.6)
Mean Plasma Glucose: 116.89 mg/dL

## 2021-09-03 LAB — BASIC METABOLIC PANEL
Anion gap: 8 (ref 5–15)
BUN: 14 mg/dL (ref 6–20)
CO2: 23 mmol/L (ref 22–32)
Calcium: 8.9 mg/dL (ref 8.9–10.3)
Chloride: 106 mmol/L (ref 98–111)
Creatinine, Ser: 1.01 mg/dL — ABNORMAL HIGH (ref 0.44–1.00)
GFR, Estimated: >60 mL/min (ref >60)
Glucose, Bld: 152 mg/dL — ABNORMAL HIGH (ref 70–99)
Potassium: 3.7 mmol/L (ref 3.5–5.1)
Sodium: 137 mmol/L (ref 135–145)

## 2021-09-03 LAB — LIPID PANEL
Cholesterol: 230 mg/dL — ABNORMAL HIGH (ref 0–200)
HDL: 62 mg/dL (ref >40)
LDL Cholesterol: 146 mg/dL — ABNORMAL HIGH (ref 0–99)
Total CHOL/HDL Ratio: 3.7 RATIO
Triglycerides: 111 mg/dL (ref <150)
VLDL: 22 mg/dL (ref 0–40)

## 2021-09-03 LAB — T4, FREE: Free T4: 0.7 ng/dL (ref 0.61–1.12)

## 2021-09-03 LAB — MAGNESIUM: Magnesium: 2.2 mg/dL (ref 1.7–2.4)

## 2021-09-03 LAB — VITAMIN D 25 HYDROXY (VIT D DEFICIENCY, FRACTURES): Vit D, 25-Hydroxy: 7.23 ng/mL — ABNORMAL LOW (ref 30–100)

## 2021-09-03 LAB — TSH: TSH: 5.075 u[IU]/mL — ABNORMAL HIGH (ref 0.350–4.500)

## 2021-09-03 LAB — VITAMIN B12: Vitamin B-12: 182 pg/mL (ref 180–914)

## 2021-09-03 LAB — AMMONIA: Ammonia: 38 umol/L — ABNORMAL HIGH (ref 9–35)

## 2021-09-03 MED ORDER — CLOTRIMAZOLE 1 % VA CREA
1.0000 | TOPICAL_CREAM | Freq: Every day | VAGINAL | Status: AC
  Administered 2021-09-04 – 2021-09-08 (×5): 1 via VAGINAL
  Filled 2021-09-03: qty 45

## 2021-09-03 MED ORDER — CLOTRIMAZOLE 2 % VA CREA
1.0000 | TOPICAL_CREAM | Freq: Every day | VAGINAL | Status: DC
  Filled 2021-09-03 (×2): qty 21

## 2021-09-03 MED ORDER — LACTULOSE 10 GM/15ML PO SOLN
20.0000 g | Freq: Two times a day (BID) | ORAL | Status: DC | PRN
  Administered 2021-09-04: 20 g via ORAL
  Filled 2021-09-03: qty 30

## 2021-09-03 MED ORDER — B COMPLEX-C PO TABS
1.0000 | ORAL_TABLET | Freq: Every day | ORAL | Status: DC
  Administered 2021-09-03 – 2021-09-13 (×11): 1 via ORAL
  Filled 2021-09-03 (×16): qty 1

## 2021-09-03 MED ORDER — MELATONIN 5 MG PO TABS
5.0000 mg | ORAL_TABLET | Freq: Every day | ORAL | Status: DC
  Administered 2021-09-03 – 2021-09-06 (×4): 5 mg via ORAL
  Filled 2021-09-03 (×7): qty 1

## 2021-09-03 MED ORDER — VITAMIN D (ERGOCALCIFEROL) 1.25 MG (50000 UNIT) PO CAPS
50000.0000 [IU] | ORAL_CAPSULE | ORAL | Status: DC
Start: 2021-09-04 — End: 2021-09-13
  Administered 2021-09-04 – 2021-09-11 (×2): 50000 [IU] via ORAL
  Filled 2021-09-03 (×2): qty 1

## 2021-09-03 NOTE — Progress Notes (Signed)
Patient endorses Passive SI, with no plan and verbally contracted for safety denies HI/A/VH. C/O ANXIETY 8/10 PRN atarax 25 mg PO given at 2038 and albuterol for wheezing. Compliant with scheduled medications. No adverse effect noted. Prn medications effective. Will continue to monitor. Q 15 safety checks ongoing without self harm gestures.

## 2021-09-03 NOTE — BH IP Treatment Plan (Signed)
Interdisciplinary Treatment and Diagnostic Plan Update  09/03/2021 Time of Session: 2:10pm Denise Knight MRN: [id number]  Principal Diagnosis: MDD (major depressive disorder), recurrent, severe, with psychosis ([place])  Secondary Diagnoses: Principal Problem:   MDD (major depressive disorder), recurrent, severe, with psychosis ([place]) Active Problems:   Cocaine abuse ([place])   Alcohol use disorder, moderate, dependence ([place])   Cocaine use disorder, severe, dependence ([place])   Long QT interval   Cannabis dependence with current use ([place] )   Current Medications:  Current Facility-Administered Medications  Medication Dose Route Frequency Provider Last Rate Last Admin   acetaminophen (TYLENOL) tablet 650 mg  650 mg Oral Q6H PRN Ajibola, Ene A, NP       albuterol (VENTOLIN HFA) 108 (90 Base) MCG/ACT inhaler 1-2 puff  1-2 puff Inhalation Q6H PRN France Ravens, MD   2 puff at 09/02/21 2041   alum & mag hydroxide-simeth (MAALOX/MYLANTA) 200-200-20 MG/5ML suspension 30 mL  30 mL Oral Q4H PRN Ajibola, Ene A, NP       hydrOXYzine (ATARAX/VISTARIL) tablet 25 mg  25 mg Oral TID PRN Ajibola, Ene A, NP   25 mg at 09/03/21 0757   lamoTRIgine (LAMICTAL) tablet 25 mg  25 mg Oral Daily France Ravens, MD   25 mg at 09/03/21 0758   magnesium hydroxide (MILK OF MAGNESIA) suspension 30 mL  30 mL Oral Daily PRN Ajibola, Ene A, NP       mirtazapine (REMERON) tablet 7.5 mg  7.5 mg Oral QHS France Ravens, MD   7.5 mg at 09/02/21 2038   pantoprazole (PROTONIX) EC tablet 40 mg  40 mg Oral Daily Lindell Spar I, NP   40 mg at 09/03/21 0757   traZODone (DESYREL) tablet 50 mg  50 mg Oral QHS PRN Ajibola, Ene A, NP       PTA Medications: Medications Prior to Admission  Medication Sig Dispense Refill Last Dose   meclizine (ANTIVERT) 25 MG tablet Take 25 mg by mouth 3 (three) times daily as needed for dizziness.      albuterol (VENTOLIN HFA) 108 (90 Base) MCG/ACT inhaler Inhale 1-2 puffs into the lungs every 6 (six) hours as  needed for wheezing or shortness of breath. 1 each 0    amLODipine (NORVASC) 10 MG tablet Take 10 mg by mouth daily.      cetirizine (ZYRTEC) 10 MG tablet Take 1 tablet (10 mg total) by mouth daily. 30 tablet 0    simvastatin (ZOCOR) 40 MG tablet Take 40 mg by mouth daily.       Patient Stressors: Financial difficulties   Health problems   Marital or family conflict    Patient Strengths:    Treatment Modalities: Medication Management, Group therapy, Case management,  1 to 1 session with clinician, Psychoeducation, Recreational therapy.   Physician Treatment Plan for Primary Diagnosis: MDD (major depressive disorder), recurrent, severe, with psychosis ([place]) Long Term Goal(s): Improvement in symptoms so as ready for discharge   Short Term Goals: Ability to identify changes in lifestyle to reduce recurrence of condition will improve Ability to verbalize feelings will improve Ability to disclose and discuss suicidal ideas Ability to demonstrate self-control will improve Ability to identify and develop effective coping behaviors will improve Ability to maintain clinical measurements within normal limits will improve Compliance with prescribed medications will improve Ability to identify triggers associated with substance abuse/mental health issues will improve  Medication Management: Evaluate patient's response, side effects, and tolerance of medication regimen.  Therapeutic Interventions: 1 to 1 sessions,  Unit Group sessions and Medication administration.  Evaluation of Outcomes: Not Met  Physician Treatment Plan for Secondary Diagnosis: Principal Problem:   MDD (major depressive disorder), recurrent, severe, with psychosis ([place]) Active Problems:   Cocaine abuse ([place])   Alcohol use disorder, moderate, dependence ([place])   Cocaine use disorder, severe, dependence ([place])   Long QT interval   Cannabis dependence with current use ([place])  Long Term Goal(s): Improvement in symptoms so as  ready for discharge   Short Term Goals: Ability to identify changes in lifestyle to reduce recurrence of condition will improve Ability to verbalize feelings will improve Ability to disclose and discuss suicidal ideas Ability to demonstrate self-control will improve Ability to identify and develop effective coping behaviors will improve Ability to maintain clinical measurements within normal limits will improve Compliance with prescribed medications will improve Ability to identify triggers associated with substance abuse/mental health issues will improve     Medication Management: Evaluate patient's response, side effects, and tolerance of medication regimen.  Therapeutic Interventions: 1 to 1 sessions, Unit Group sessions and Medication administration.  Evaluation of Outcomes: Not Met   RN Treatment Plan for Primary Diagnosis: MDD (major depressive disorder), recurrent, severe, with psychosis ([place]) Long Term Goal(s): Knowledge of disease and therapeutic regimen to maintain health will improve  Short Term Goals: Ability to remain free from injury will improve, Ability to verbalize frustration and anger appropriately will improve, Ability to demonstrate self-control, Ability to participate in decision making will improve, Ability to identify and develop effective coping behaviors will improve, and Compliance with prescribed medications will improve  Medication Management: RN will administer medications as ordered by provider, will assess and evaluate patient's response and provide education to patient for prescribed medication. RN will report any adverse and/or side effects to prescribing provider.  Therapeutic Interventions: 1 on 1 counseling sessions, Psychoeducation, Medication administration, Evaluate responses to treatment, Monitor vital signs and CBGs as ordered, Perform/monitor CIWA, COWS, AIMS and Fall Risk screenings as ordered, Perform wound care treatments as  ordered.  Evaluation of Outcomes: Not Met   LCSW Treatment Plan for Primary Diagnosis: MDD (major depressive disorder), recurrent, severe, with psychosis ([place]) Long Term Goal(s): Safe transition to appropriate next level of care at discharge, Engage patient in therapeutic group addressing interpersonal concerns.  Short Term Goals: Engage patient in aftercare planning with referrals and resources, Increase social support, Facilitate acceptance of mental health diagnosis and concerns, Facilitate patient progression through stages of change regarding substance use diagnoses and concerns, Identify triggers associated with mental health/substance abuse issues, and Increase skills for wellness and recovery  Therapeutic Interventions: Assess for all discharge needs, 1 to 1 time with Social worker, Explore available resources and support systems, Assess for adequacy in community support network, Educate family and significant other(s) on suicide prevention, Complete Psychosocial Assessment, Interpersonal group therapy.  Evaluation of Outcomes: Not Met   Progress in Treatment: Attending groups: Yes. Participating in groups: Yes. Taking medication as prescribed: Yes. Toleration medication: Yes. Family/Significant other contact made: No, will contact:  if consent is provided Patient understands diagnosis: Yes. Discussing patient identified problems/goals with staff: Yes. Medical problems stabilized or resolved: Yes. Denies suicidal/homicidal ideation: Yes. Issues/concerns per patient self-inventory: No.   New problem(s) identified: No, Describe:  none  New Short Term/Long Term Goal(s): detox, medication management for mood stabilization; elimination of SI thoughts; development of comprehensive mental wellness/sobriety plan  Patient Goals:  "Get back on medicine, get rid of suicidal ideation. Get into rehab"  Discharge Plan or  Barriers: Patient recently admitted. CSW will continue to follow and  assess for appropriate referrals and possible discharge planning.    Reason for Continuation of Hospitalization: Anxiety Depression Medication stabilization Suicidal ideation Withdrawal symptoms  Estimated Length of Stay: 3-5 days   Scribe for Treatment Team: Vassie Moselle, LCSW 09/03/2021 2:57 PM

## 2021-09-03 NOTE — [facility] Group Notes (Signed)
Patient did not attend group.  Spiritual care group on grief and loss facilitated by chaplain Janne Napoleon, [facility]   Group Goal:   Support / Education around grief and loss   Members engage in facilitated group support and psycho-social education.   Group Description:   Following introductions and group rules, group members engaged in facilitated group dialog and support around topic of loss, with particular support around experiences of loss in their lives. Group Identified types of loss (relationships / self / things) and identified patterns, circumstances, and changes that precipitate losses. Reflected on thoughts / feelings around loss, normalized grief responses, and recognized variety in grief experience. Group noted Worden's four tasks of grief in discussion.   Group drew on Adlerian / Rogerian, narrative, MI,   Patient Progress:

## 2021-09-03 NOTE — Progress Notes (Addendum)
[facility] MD Progress Note  09/03/2021 3:37 PM Denise Knight  MRN:  [id number] Subjective:  Patient is a 55 year old female with reported psychiatric history of depression, bipolar, PTSD admitted to [facility] from [facility] ED due to worsening suicidal ideation and suicide attempt with Advil overdose.  Chart Review, 24 hr Events: The patient's chart was reviewed and nursing notes were reviewed. The patient's case was discussed in multidisciplinary team meeting.  Per MAR: - Patient is compliant with scheduled meds. - PRNs: albuterol, hydroxyzine Per RN notes, no documented behavioral issues and is attending group. Patient slept, 6.75 hours  Patient had the following psychiatric recommendations yesterday: -Start Remeron 7.5 nightly for PTSD and sleep.  R/B/SE discussed with patient patient is agreeable to medication trial -Start Lamictal 25 mg daily for mood stability and bipolar disorder.  R/B/SE discussed with patient and patient agreeable to medication trial.  TODAY'S INTERVIEW Patient seen and assessed with attending Dr. Berdine Addison.  Patient feels that she is doing the same as yesterday.  Patient feels that she is still experiencing severe depression as well as passive suicidal ideation.  Patient states that "I want to anything while I am on the unit because I will have pills to overdose on".  Patient also complaining of not having a bowel movement since admission.  Discussed given her hyperammonemia, lactulose will be added to her medication so that she is able to reduce ammonia as well as aid with bowel movements.  Patient also complaining that she did not complete her yeast infection treatment.  Discussed adding clotrimazole vaginal cream for 3 days.  Patient verbalized agreement.  Encourage patient to attend group and be more active while on the unit.  Patient states she is eating appropriately, sleeping appropriately, and hydrating appropriately.  Patient has no additional complaints at this  time.  Patient denies present HI/AVH.  Patient denies paranoia, first rank symptoms, ideas of reference, thought broadcasting.  Principal Problem: MDD (major depressive disorder), recurrent, severe, with psychosis ([place]) Diagnosis: Principal Problem:   MDD (major depressive disorder), recurrent, severe, with psychosis ([place]) Active Problems:   Cocaine abuse ([place])   Alcohol use disorder, moderate, dependence ([place])   Cocaine use disorder, severe, dependence ([place])   Long QT interval   Cannabis dependence with current use ([place])   Vitamin D deficiency  Total Time spent with patient: 25 minutes  Past Psychiatric History: see H&P  Past Medical History:  Past Medical History:  Diagnosis Date   Allergy    seasonal   Anxiety    Arthritis    "left knee" (07/05/2016)   Asthma    Bipolar 1 disorder ([place])    Depression    Diabetes mellitus without complication (HCC)    pre-   GERD (gastroesophageal reflux disease)    Hypertension    MI (mitral incompetence)    Post traumatic stress disorder    Schizophrenia ([place])    Stomach ulcer    Substance abuse ([place])    cocaine quit july 2020   Suicide attempt [facility])     Past Surgical History:  Procedure Laterality Date   CARDIAC CATHETERIZATION  07/05/2016   CARDIAC CATHETERIZATION N/A 07/05/2016   Procedure: Left Heart Cath and Coronary Angiography;  Surgeon: Adrian Prows, MD;  Location: [place] [facility];  Service: Cardiovascular;  Laterality: N/A;   CYST EXCISION Right    "wrist"   DILATION AND CURETTAGE OF UTERUS     FOOT SURGERY Bilateral    "corns removed"   LEFT HEART  CATH AND CORONARY ANGIOGRAPHY N/A 12/24/2017   Procedure: LEFT HEART CATH AND CORONARY ANGIOGRAPHY;  Surgeon: Nigel Mormon, MD;  Location: [facility];  Service: Cardiovascular;  Laterality: N/A;   TUBAL LIGATION     Family History:  Family History  Problem Relation Age of Onset   Breast cancer Other    Lung cancer Other    Congestive Heart Failure Other     Diabetes Other    Hypertension Other    Colon cancer Neg Hx    Colon polyps Neg Hx    Esophageal cancer Neg Hx    Stomach cancer Neg Hx    Rectal cancer Neg Hx    Family Psychiatric  History: seee H&P Social History:  Social History   Substance and Sexual Activity  Alcohol Use Yes   Alcohol/week: 7.0 standard drinks   Types: 7 Cans of beer per week     Social History   Substance and Sexual Activity  Drug Use Yes   Types: Marijuana    Social History   Socioeconomic History   Marital status: Widowed    Spouse name: Not on file   Number of children: 2   Years of education: Not on file   Highest education level: Not on file  Occupational History   Occupation: disabled   Tobacco Use   Smoking status: Some Days    Packs/day: 0.25    Years: 35.00    Pack years: 8.75    Types: Cigarettes    Last attempt to quit: 05/30/2019    Years since quitting: 2.2   Smokeless tobacco: Never  Vaping Use   Vaping Use: Never used  Substance and Sexual Activity   Alcohol use: Yes    Alcohol/week: 7.0 standard drinks    Types: 7 Cans of beer per week   Drug use: Yes    Types: Marijuana   Sexual activity: Yes    Birth control/protection: Surgical  Other Topics Concern   Not on file  Social History Narrative   Not on file   Social Determinants of Health   Financial Resource Strain: Not on file  Food Insecurity: Not on file  Transportation Needs: Not on file  Physical Activity: Not on file  Stress: Not on file  Social Connections: Not on file   Additional Social History:                         Sleep: Fair  Appetite:  Fair  Current Medications: Current Facility-Administered Medications  Medication Dose Route Frequency Provider Last Rate Last Admin   acetaminophen (TYLENOL) tablet 650 mg  650 mg Oral Q6H PRN Ajibola, Ene A, NP       albuterol (VENTOLIN HFA) 108 (90 Base) MCG/ACT inhaler 1-2 puff  1-2 puff Inhalation Q6H PRN France Ravens, MD   2 puff at  09/02/21 2041   alum & mag hydroxide-simeth (MAALOX/MYLANTA) 200-200-20 MG/5ML suspension 30 mL  30 mL Oral Q4H PRN Ajibola, Ene A, NP       B-complex with vitamin C tablet 1 tablet  1 tablet Oral Daily Yerick Eggebrecht, Jackie Plum, MD       hydrOXYzine (ATARAX/VISTARIL) tablet 25 mg  25 mg Oral TID PRN Ajibola, Ene A, NP   25 mg at 09/03/21 0757   lactulose (CHRONULAC) 10 GM/15ML solution 20 g  20 g Oral BID PRN Maida Sale, MD       lamoTRIgine (LAMICTAL) tablet 25 mg  25  mg Oral Daily France Ravens, MD   25 mg at 09/03/21 0758   melatonin tablet 5 mg  5 mg Oral QHS Deann Mclaine, Jackie Plum, MD       mirtazapine (REMERON) tablet 7.5 mg  7.5 mg Oral QHS France Ravens, MD   7.5 mg at 09/02/21 2038   pantoprazole (PROTONIX) EC tablet 40 mg  40 mg Oral Daily Lindell Spar I, NP   40 mg at 09/03/21 0757   Vitamin D (Ergocalciferol) (DRISDOL) capsule 50,000 Units  50,000 Units Oral Q7 days Maida Sale, MD        Lab Results:  Results for orders placed or performed during the hospital encounter of 09/02/21 (from the past 48 hour(s))  Urinalysis, Routine w reflex microscopic Urine, Clean Catch     Status: Abnormal   Collection Time: 09/02/21  3:35 PM  Result Value Ref Range   Color, Urine YELLOW YELLOW   APPearance CLOUDY (A) CLEAR   Specific Gravity, Urine 1.021 1.005 - 1.030   pH 5.0 5.0 - 8.0   Glucose, UA NEGATIVE NEGATIVE mg/dL   Hgb urine dipstick MODERATE (A) NEGATIVE   Bilirubin Urine NEGATIVE NEGATIVE   Ketones, ur NEGATIVE NEGATIVE mg/dL   Protein, ur 100 (A) NEGATIVE mg/dL   Nitrite NEGATIVE NEGATIVE   Leukocytes,Ua SMALL (A) NEGATIVE   RBC / HPF 11-20 0 - 5 RBC/hpf   WBC, UA 6-10 0 - 5 WBC/hpf   Bacteria, UA RARE (A) NONE SEEN   Squamous Epithelial / LPF 21-50 0 - 5   Mucus PRESENT    Hyaline Casts, UA PRESENT    Amorphous Crystal PRESENT     Comment: Performed at [facility], [place] [address], [place], [place] [address]  Magnesium     Status: None    Collection Time: 09/03/21  6:39 AM  Result Value Ref Range   Magnesium 2.2 1.7 - 2.4 mg/dL    Comment: Performed at [facility], [place] [address] [place], [place] [address]  Basic metabolic panel     Status: Abnormal   Collection Time: 09/03/21  6:39 AM  Result Value Ref Range   Sodium 137 135 - 145 mmol/L   Potassium 3.7 3.5 - 5.1 mmol/L   Chloride 106 98 - 111 mmol/L   CO2 23 22 - 32 mmol/L   Glucose, Bld 152 (H) 70 - 99 mg/dL    Comment: Glucose reference range applies only to samples taken after fasting for at least 8 hours.   BUN 14 6 - 20 mg/dL   Creatinine, Ser 1.01 (H) 0.44 - 1.00 mg/dL   Calcium 8.9 8.9 - 10.3 mg/dL   GFR, Estimated >60 >60 mL/min    Comment: (NOTE) Calculated using the CKD-EPI Creatinine Equation (2021)    Anion gap 8 5 - 15    Comment: Performed at [facility], [place] [address] [place], [place] [address]  Ammonia     Status: Abnormal   Collection Time: 09/03/21  6:39 AM  Result Value Ref Range   Ammonia 38 (H) 9 - 35 umol/L    Comment: Performed at [facility], [place] [address], [place], [place] [address]  VITAMIN D 25 Hydroxy (Vit-D Deficiency, Fractures)     Status: Abnormal   Collection Time: 09/03/21  6:39 AM  Result Value Ref Range   Vit D, 25-Hydroxy 7.23 (L) 30 - 100 ng/mL    Comment: (NOTE) Vitamin D deficiency has been defined by the Institute  of Medicine  and an Endocrine Society practice guideline as a level of serum 25-OH  vitamin D less than 20 ng/mL (1,2). The Endocrine Society went on to  further define vitamin D insufficiency as a level between 21 and 29  ng/mL (2).  1. IOM (Institute of Medicine). 2010. Dietary reference intakes for  calcium and D. [place]: The [employer]. 2. Holick MF, [place], Bischoff-Ferrari HA, et al. Evaluation,  treatment, and prevention of vitamin D deficiency: an Endocrine  Society clinical practice guideline, JCEM. 2011 Jul;  96(7): 1911-30.  Performed at [facility], [place] [address] [place], [place] [address]   RPR     Status: None   Collection Time: 09/03/21  6:39 AM  Result Value Ref Range   RPR Ser Ql NON REACTIVE NON REACTIVE    Comment: Performed at [facility], [place] [address], [place], [place] [address]  TSH     Status: Abnormal   Collection Time: 09/03/21  6:39 AM  Result Value Ref Range   TSH 5.075 (H) 0.350 - 4.500 uIU/mL    Comment: Performed by a 3rd Generation assay with a functional sensitivity of <=0.01 uIU/mL. Performed at [facility], [place] [address] [place], [place] [address]   Lipid panel     Status: Abnormal   Collection Time: 09/03/21  6:39 AM  Result Value Ref Range   Cholesterol 230 (H) 0 - 200 mg/dL   Triglycerides 111 <150 mg/dL   HDL 62 >40 mg/dL   Total CHOL/HDL Ratio 3.7 RATIO   VLDL 22 0 - 40 mg/dL   LDL Cholesterol 146 (H) 0 - 99 mg/dL    Comment:        Total Cholesterol/HDL:CHD Risk Coronary Heart Disease Risk Table                     Men   Women  1/2 Average Risk   3.4   3.3  Average Risk       5.0   4.4  2 X Average Risk   9.6   7.1  3 X Average Risk  23.4   11.0        Use the calculated Patient Ratio above and the CHD Risk Table to determine the patient's CHD Risk.        ATP III CLASSIFICATION (LDL):  <100     mg/dL   Optimal  100-129  mg/dL   Near or Above                    Optimal  130-159  mg/dL   Borderline  160-189  mg/dL   High  >190     mg/dL   Very High Performed at [place] [address], [place], [place] [address]   Vitamin B12     Status: None   Collection Time: 09/03/21  6:39 AM  Result Value Ref Range   Vitamin B-12 182 180 - 914 pg/mL    Comment: (NOTE) This assay is not validated for testing neonatal or myeloproliferative syndrome specimens for Vitamin B12 levels. Performed at [facility], [place] [address], [place], [place] [address]     Blood  Alcohol level:  Lab Results  Component Value Date   ETH <10 09/02/2021   ETH 33 (H) 42/59/5638    Metabolic Disorder Labs: Lab Results  Component Value Date   HGBA1C 5.7 (H) 01/13/2020   MPG  116.89 01/13/2020   MPG 120 06/15/2017   No results found for: PROLACTIN Lab Results  Component Value Date   CHOL 230 (H) 09/03/2021   TRIG 111 09/03/2021   HDL 62 09/03/2021   CHOLHDL 3.7 09/03/2021   VLDL 22 09/03/2021   LDLCALC 146 (H) 09/03/2021   LDLCALC 114 (H) 01/13/2020    Physical Findings: AIMS: Facial and Oral Movements Muscles of Facial Expression: None, normal Lips and Perioral Area: None, normal Jaw: None, normal Tongue: None, normal,Extremity Movements Upper (arms, wrists, hands, fingers): None, normal Lower (legs, knees, ankles, toes): None, normal, Trunk Movements Neck, shoulders, hips: None, normal, Overall Severity Severity of abnormal movements (highest score from questions above): None, normal Incapacitation due to abnormal movements: None, normal Patient's awareness of abnormal movements (rate only patient's report): No Awareness, Dental Status Current problems with teeth and/or dentures?: No Does patient usually wear dentures?: No  CIWA:    COWS:     Musculoskeletal: Strength & Muscle Tone: within normal limits Gait & Station: normal Patient leans: N/A  Psychiatric Specialty Exam:  Presentation  General Appearance: Casual; Bizarre  Eye Contact:Minimal  Speech:Clear and Coherent  Speech Volume:Decreased  Handedness:No data recorded  Mood and Affect  Mood:Depressed  Affect:Depressed; Tearful   Thought Process  Thought Processes:Linear; Goal Directed; Coherent  Descriptions of Associations:Intact  Orientation:Full (Time, Place and Person)  Thought Content:Logical  History of Schizophrenia/Schizoaffective disorder:No  Duration of Psychotic Symptoms:No data recorded Hallucinations:Hallucinations: None  Ideas of  Reference:None  Suicidal Thoughts:Suicidal Thoughts: No  Homicidal Thoughts:Homicidal Thoughts: No   Sensorium  Memory:Immediate Good; Recent Good; Remote Good  Judgment:Fair  Insight:Fair   Executive Functions  Concentration:Fair  Attention Span:Fair  [place]   Psychomotor Activity  Psychomotor Activity:Psychomotor Activity: Normal   Assets  Assets:Communication Skills; Desire for Improvement; Resilience   Sleep  Sleep:Sleep: Fair    Physical Exam: Physical Exam Vitals and nursing note reviewed.  Constitutional:      Appearance: Normal appearance. She is normal weight.  HENT:     Head: Normocephalic and atraumatic.  Pulmonary:     Effort: Pulmonary effort is normal.  Neurological:     General: No focal deficit present.     Mental Status: She is oriented to person, place, and time.   Review of Systems  Respiratory:  Negative for shortness of breath.   Cardiovascular:  Negative for chest pain.  Gastrointestinal:  Negative for abdominal pain, constipation, diarrhea, heartburn, nausea and vomiting.  Neurological:  Negative for headaches.  Blood pressure (!) 129/93, pulse 85, temperature 98.6 F (37 C), temperature source Oral, resp. rate 18, height 5' 8.5" (1.74 m), weight 88.5 kg, SpO2 100 %. Body mass index is 29.22 kg/m.   Treatment Plan Summary: Daily contact with patient to assess and evaluate symptoms and progress in treatment and Medication management  ASSESSMENT Patient is a 55 year old female with reported psychiatric history of depression, bipolar, PTSD admitted to [facility] from [facility] ED due to worsening suicidal ideation and suicide attempt with Advil overdose and smoking crack.  Patient states she is largely unchanged from yesterday and is also likely withdrawing from the crack she smoked Saturday.  We will continue to assess patient's mood and physical complaints daily. PLAN Psychiatric  Problems Bipolar Disorder, Type II depressive episode (r/o MDD -severe) PTSD Substance Abuse Alcohol Use Disorder-Severe -Remeron 7.5 nightly for PTSD and sleep.  R/B/SE discussed with patient patient is agreeable to medication trial -Lamictal 25 mg daily for  mood stability and bipolar disorder.  R/B/SE discussed with patient and patient agreeable to medication trial -Melatonin 5 mg   Medical Problems Magnesium 2.2, creatinine 1.01, ammonia 38, vitamin D 7.23, negative RPR, TSH 5.075  Subclinical Hypothyroidism Free T3, Free T4 pending  Vitamin D Deficiency -Cholecalciferol 50000 units every week  Hyperammonemia Lactulose 10 g bid   Chest Pain -Likely musculoskeletal in nature -Today's QTC 447. -We will continue to monitor   Hypercholesteremia -Lipid panel ordered, likely will recommend PCP follow-up should lipid panel returned abnormal   Asthma -Albuterol inhaler prn for wheezing/shortness of breath   HTN -Last blood pressure: 107/59 -We will continue to monitor.  May add antihypertensive should patient's vitals be unstable.   Vertigo -We will continue to monitor -Recommend PCP follow-up to monitor vertigo  Refractory Reported Yeast Infection Clotrimazole cream qhs x3days  PRNs Tylenol 650 mg for mild pain Maalox/Mylanta 30 mL for indigestion Bendaryl q6hr for itching/allergies Hydroxyzine 25 mg tid for anxiety Milk of Magnesia 30 mL for constipation   3. Safety and Monitoring: Voluntary admission to inpatient psychiatric unit for safety, stabilization and treatment Daily contact with patient to assess and evaluate symptoms and progress in treatment Patient's case to be discussed in multi-disciplinary team meeting Observation Level : q15 minute checks Vital signs: q12 hours Precautions: suicide, elopement, and assault   4. Discharge Planning: Social work and case management to assist with discharge planning and identification of hospital follow-up needs  prior to discharge Estimated LOS: 5-7 days Discharge Concerns: Need to establish a safety plan; Medication compliance and effectiveness Discharge Goals: Return home with outpatient referrals for mental health follow-up including medication management/psychotherapy  France Ravens, MD 09/03/2021, 3:37 PM

## 2021-09-03 NOTE — Progress Notes (Signed)
Psychoeducational Group Note  Date:  09/03/2021 Time:  2209  Group Topic/Focus:  Wrap-Up Group:   The focus of this group is to help patients review their daily goal of treatment and discuss progress on daily workbooks.  Participation Level: Did Not Attend  Participation Quality:  Not Applicable  Affect:  Not Applicable  Cognitive:  Not Applicable  Insight:  Not Applicable  Engagement in Group: Not Applicable  Additional Comments:  The patient did not attend group this evening.   Allante Whitmire S 09/03/2021, 10:09 PM

## 2021-09-03 NOTE — Group Note (Signed)
LCSW Group Therapy Note  Group Date: 09/03/2021 Start Time: 1300 End Time: 1400   Type of Therapy and Topic:  Group Therapy: Anger Cues and Responses  Participation Level:  Minimal   Description of Group:   In this group, patients learned how to recognize the physical, cognitive, emotional, and behavioral responses they have to anger-provoking situations.  They identified a recent time they became angry and how they reacted.  They analyzed how their reaction was possibly beneficial and how it was possibly unhelpful.  The group discussed a variety of healthier coping skills that could help with such a situation in the future.  Focus was placed on how helpful it is to recognize the underlying emotions to our anger, because working on those can lead to a more permanent solution as well as our ability to focus on the important rather than the urgent.  Therapeutic Goals: Patients will remember their last incident of anger and how they felt emotionally and physically, what their thoughts were at the time, and how they behaved. Patients will identify how their behavior at that time worked for them, as well as how it worked against them. Patients will explore possible new behaviors to use in future anger situations. Patients will learn that anger itself is normal and cannot be eliminated, and that healthier reactions can assist with resolving conflict rather than worsening situations.  Summary of Patient Progress: Denise Knight introduced herself but participated very minimally in group. Patient did not participate in activities but was respectful toward others who were participating. Throughout most of group, patient colored rather than participate.   Therapeutic Modalities:   Cognitive Behavioral Therapy    Zachery Conch, LCSW 09/03/2021  1:58 PM

## 2021-09-03 NOTE — Group Note (Signed)
Recreation Therapy Group Note   Group Topic:Stress Management  Group Date: 09/03/2021 Start Time: 0935 End Time: 3202 Facilitators: Victorino Sparrow, LRT/CTRS Location: [address] Dayroom  Goal Area(s) Addresses:  Patient will identify positive stress management techniques. Patient will identify benefits of using stress management post d/c.  Group Description:  Meditation.  LRT played a meditation that focused on having patience when dealing with situations beyond our control.  Patients were to sit and focus as the meditation played to fully engage.   Affect/Mood: N/A   Participation Level: Did not attend    Clinical Observations/Individualized Feedback: Pt did not attend group.    Plan: Continue to engage patient in RT group sessions 2-3x/week.   Victorino Sparrow, LRT/CTRS 09/03/2021 1:22 PM

## 2021-09-03 NOTE — Progress Notes (Signed)
DAR NOTE: Patient presents with anxious affect and depressed mood.  Reports passive SI but verbally contracts for safety.  Described energy level as low with poor concentration.  Rates depression at 9, hopelessness at 9, and anxiety at 9.  Maintained on routine safety checks.  Medications given as prescribed.  Support and encouragement offered as needed.  Attended group and participated.  States goal for today is "not hurting myself."  Patient observed socializing with peers in the dayroom. Tylenol 650 mg given for complain of pain.  Patient is safe on and off the unit.

## 2021-09-03 NOTE — [facility] Group Notes (Signed)
Patient was given material for group today

## 2021-09-04 DIAGNOSIS — F3181 Bipolar II disorder: Secondary | ICD-10-CM | POA: Diagnosis present

## 2021-09-04 LAB — T3, FREE: T3, Free: 2.2 pg/mL (ref 2.0–4.4)

## 2021-09-04 MED ORDER — SIMVASTATIN 40 MG PO TABS
40.0000 mg | ORAL_TABLET | Freq: Every day | ORAL | Status: DC
  Administered 2021-09-05 – 2021-09-12 (×8): 40 mg via ORAL
  Filled 2021-09-04 (×10): qty 1

## 2021-09-04 MED ORDER — MIRTAZAPINE 15 MG PO TABS
15.0000 mg | ORAL_TABLET | Freq: Every day | ORAL | Status: DC
  Administered 2021-09-04 – 2021-09-12 (×9): 15 mg via ORAL
  Filled 2021-09-04 (×12): qty 1

## 2021-09-04 MED ORDER — AMLODIPINE BESYLATE 10 MG PO TABS
10.0000 mg | ORAL_TABLET | Freq: Every day | ORAL | Status: DC
  Administered 2021-09-04 – 2021-09-13 (×10): 10 mg via ORAL
  Filled 2021-09-04 (×14): qty 1

## 2021-09-04 MED ORDER — POLYETHYLENE GLYCOL 3350 17 G PO PACK
17.0000 g | PACK | Freq: Every day | ORAL | Status: DC | PRN
  Administered 2021-09-05 – 2021-09-12 (×4): 17 g via ORAL
  Filled 2021-09-04 (×4): qty 1

## 2021-09-04 MED ORDER — PANTOPRAZOLE SODIUM 40 MG PO TBEC
40.0000 mg | DELAYED_RELEASE_TABLET | Freq: Every day | ORAL | Status: DC
  Administered 2021-09-05 – 2021-09-07 (×3): 40 mg via ORAL
  Filled 2021-09-04 (×5): qty 1

## 2021-09-04 MED ORDER — WHITE PETROLATUM EX OINT
TOPICAL_OINTMENT | CUTANEOUS | Status: AC
  Filled 2021-09-04: qty 5

## 2021-09-04 MED ORDER — PANTOPRAZOLE SODIUM 40 MG PO TBEC: 40.0000 mg | DELAYED_RELEASE_TABLET | Freq: Every morning | ORAL | Status: DC

## 2021-09-04 MED ORDER — RISPERIDONE 1 MG PO TABS
1.0000 mg | ORAL_TABLET | Freq: Every day | ORAL | Status: DC
  Administered 2021-09-04: 1 mg via ORAL
  Filled 2021-09-04 (×4): qty 1

## 2021-09-04 NOTE — Progress Notes (Signed)
   09/04/21 2200  Psych Admission Type (Psych Patients Only)  Admission Status Voluntary  Psychosocial Assessment  Patient Complaints Anxiety  Eye Contact Fair  Facial Expression Blank  Affect Flat  Speech Logical/coherent  Interaction Assertive  Motor Activity Other (Comment) (WNL)  Appearance/Hygiene Unremarkable  Behavior Characteristics Cooperative  Mood Depressed;Anxious  Thought Process  Coherency Circumstantial  Content Blaming self  Delusions Other (Comment)  Perception WDL  Hallucination None reported or observed  Judgment Poor  Confusion WDL  Danger to Self  Current suicidal ideation? Passive  Self-Injurious Behavior No self-injurious ideation or behavior indicators observed or expressed   Agreement Not to Harm Self Yes  Description of Agreement Verbal  Danger to Others  Danger to Others None reported or observed  Patient compliant with medications continues to endorse passive SI and verbally contract for safety. Support and encouragement provided as needed.

## 2021-09-04 NOTE — Progress Notes (Signed)
   09/04/21 1200  Psych Admission Type (Psych Patients Only)  Admission Status Voluntary  Psychosocial Assessment  Patient Complaints Anxiety  Eye Contact Fair  Facial Expression Blank  Affect Flat  Speech Logical/coherent  Interaction Assertive  Motor Activity Other (Comment) (WNL)  Appearance/Hygiene Unremarkable  Behavior Characteristics Cooperative  Mood Depressed;Anxious  Thought Process  Coherency Circumstantial  Content Blaming self  Delusions Other (Comment)  Perception WDL  Hallucination None reported or observed  Judgment Poor  Confusion WDL  Danger to Self  Current suicidal ideation? Passive  Self-Injurious Behavior No self-injurious ideation or behavior indicators observed or expressed   Agreement Not to Harm Self Yes  Description of Agreement Verbal  Danger to Others  Danger to Others None reported or observed

## 2021-09-04 NOTE — Group Note (Signed)
Group Topic: Goal Setting  Group Date: 09/04/2021 Start Time: 0830 End Time: 0900 Facilitators: Loney Loh, NT  Department: [place] [facility]  Number of Participants: 11  Group Focus: goals/reality orientation  Name: Lynix Bonine Date of Birth: October 13, 1966  MR: [id number]    Level of Participation: Pt did not attend.
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
## 2021-09-04 NOTE — Progress Notes (Signed)
Psychoeducational Group Note  Date:  09/04/2021 Time:  2212  Group Topic/Focus:  Wrap-Up Group:   The focus of this group is to help patients review their daily goal of treatment and discuss progress on daily workbooks.  Participation Level: Did Not Attend  Participation Quality:  Not Applicable  Affect:  Not Applicable  Cognitive:  Not Applicable  Insight:  Not Applicable  Engagement in Group: Not Applicable  Additional Comments:  Patient did not attend group due to her stomach issues.   [name] S 09/04/2021, 10:12 PM

## 2021-09-04 NOTE — [facility] Group Notes (Signed)
Adult Psychoeducational Group Note  Date:  09/04/2021 Time:  4:45 PM  Group Topic/Focus:  Managing Feelings:   The focus of this group is to identify what feelings patients have difficulty handling and develop a plan to handle them in a healthier way upon discharge.  Participation Level:  Active  Participation Quality:  Attentive  Affect:  Appropriate  Cognitive:  Alert  Insight: Appropriate  Engagement in Group:  Engaged  Modes of Intervention:  Discussion  Additional Comments:  Patient attended and participated in the Psycho-Ed group.  Annie Sable 09/04/2021, 4:45 PM

## 2021-09-04 NOTE — Progress Notes (Addendum)
[facility] MD Progress Note  09/04/2021 6:31 PM Denise Knight  MRN:  [id number] Subjective:  Patient is a 55 year old female with reported psychiatric history of depression, bipolar, PTSD admitted to [facility] from [facility] ED due to worsening suicidal ideation and suicide attempt with Advil overdose.  Chart Review, 24 hr Events: The patient's chart was reviewed and nursing notes were reviewed. The patient's case was discussed in multidisciplinary team meeting.  Per MAR: - Patient is compliant with scheduled meds. - PRNs: albuterol, hydroxyzine Per RN notes, no documented behavioral issues and is attending group. Patient slept, 5 hours  Patient had the following psychiatric recommendations yesterday: -Start Remeron 7.5 nightly for PTSD and sleep -Start Lamictal 25 mg daily for mood stability and bipolar disorder.  R/B/SE discussed with patient and patient agreeable to medication trial.  TODAY'S INTERVIEW Patient seen and assessed with attending Dr. Nelda Marseille.  Patient feels that she is doing "so-so" today.  Patient states that she has been having a lot of racing thoughts as well as unable to sleep due to these racing thoughts.  Patient states that she has been up since 2:30 or 3:00.  Patient states that she is also much more anxious and upset due to one of the patients that is causing a lot of disruptions in patient's care.  This patient has since been moved to a different hall which provided patient with much relief.  Patient also mentions that she has had visual hallucinations stating that she saw a woman by the door while in the group room even though the other patient stated that there was no person there.  Patient also states that last night she may have been unable to sleep due to thinking there was a woman by her window. Discussed with patient the possibility that she requires an antipsychotic which patient feels is appropriate at this time.  Patient states that she has been tried on  multiple antipsychotics and that Risperdal had shown good effect with her to the point that she decided to discontinue it after feeling better for a few months last year.  Also discussed with patient lab results of having her TSH be elevated but also mention how T3 and T4 were normal which indicated more likely a stress response rather than hypothyroidism.  Patient verbalized understanding.  Patient also requesting clotrimazole vaginal cream for her reported yeast infection.  Patient still endorsing passive SI but is able to contract for safety while on the unit.    Patient denies present HI/AH.  Patient denies first rank symptoms, ideas of reference, thought broadcasting.  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 ([place]) Active Problems:   Cocaine abuse ([place])   Alcohol use disorder, moderate, dependence ([place])   Cannabis dependence with current use ([place])   Vitamin D deficiency  Total Time spent with patient: 25 minutes  Past Psychiatric History: see H&P  Past Medical History:  Past Medical History:  Diagnosis Date   Allergy    seasonal   Anxiety    Arthritis    "left knee" (07/05/2016)   Asthma    Bipolar 1 disorder ([place])    Depression    Diabetes mellitus without complication (HCC)    pre-   GERD (gastroesophageal reflux disease)    Hypertension    MI (mitral incompetence)    Post traumatic stress disorder    Schizophrenia ([place])    Stomach ulcer  Substance abuse ([place])    cocaine quit july 2020   Suicide attempt [facility])     Past Surgical History:  Procedure Laterality Date   CARDIAC CATHETERIZATION  07/05/2016   CARDIAC CATHETERIZATION N/A 07/05/2016   Procedure: Left Heart Cath and Coronary Angiography;  Surgeon: Adrian Prows, MD;  Location: [place] [facility];  Service: Cardiovascular;  Laterality: N/A;   CYST EXCISION Right    "wrist"    DILATION AND CURETTAGE OF UTERUS     FOOT SURGERY Bilateral    "corns removed"   LEFT HEART CATH AND CORONARY ANGIOGRAPHY N/A 12/24/2017   Procedure: LEFT HEART CATH AND CORONARY ANGIOGRAPHY;  Surgeon: Nigel Mormon, MD;  Location: [place] [facility];  Service: Cardiovascular;  Laterality: N/A;   TUBAL LIGATION     Family History:  Family History  Problem Relation Age of Onset   Breast cancer Other    Lung cancer Other    Congestive Heart Failure Other    Diabetes Other    Hypertension Other    Colon cancer Neg Hx    Colon polyps Neg Hx    Esophageal cancer Neg Hx    Stomach cancer Neg Hx    Rectal cancer Neg Hx    Family Psychiatric  History: seee H&P Social History:  Social History   Substance and Sexual Activity  Alcohol Use Yes   Alcohol/week: 7.0 standard drinks   Types: 7 Cans of beer per week     Social History   Substance and Sexual Activity  Drug Use Yes   Types: Marijuana    Social History   Socioeconomic History   Marital status: Widowed    Spouse name: Not on file   Number of children: 2   Years of education: Not on file   Highest education level: Not on file  Occupational History   Occupation: disabled   Tobacco Use   Smoking status: Some Days    Packs/day: 0.25    Years: 35.00    Pack years: 8.75    Types: Cigarettes    Last attempt to quit: 05/30/2019    Years since quitting: 2.2   Smokeless tobacco: Never  Vaping Use   Vaping Use: Never used  Substance and Sexual Activity   Alcohol use: Yes    Alcohol/week: 7.0 standard drinks    Types: 7 Cans of beer per week   Drug use: Yes    Types: Marijuana   Sexual activity: Yes    Birth control/protection: Surgical  Other Topics Concern   Not on file  Social History Narrative   Not on file   Social Determinants of Health   Financial Resource Strain: Not on file  Food Insecurity: Not on file  Transportation Needs: Not on file  Physical Activity: Not on file  Stress: Not on file   Social Connections: Not on file   Additional Social History:                         Sleep: Poor  Appetite:  Fair  Current Medications: Current Facility-Administered Medications  Medication Dose Route Frequency Provider Last Rate Last Admin   acetaminophen (TYLENOL) tablet 650 mg  650 mg Oral Q6H PRN Ajibola, Ene A, NP   650 mg at 09/03/21 1853   albuterol (VENTOLIN HFA) 108 (90 Base) MCG/ACT inhaler 1-2 puff  1-2 puff Inhalation Q6H PRN France Ravens, MD   2 puff at 09/04/21 1252   alum & mag  hydroxide-simeth (MAALOX/MYLANTA) 200-200-20 MG/5ML suspension 30 mL  30 mL Oral Q4H PRN Ajibola, Ene A, NP       B-complex with vitamin C tablet 1 tablet  1 tablet Oral Daily Hill, Jackie Plum, MD   1 tablet at 09/04/21 7106   clotrimazole (GYNE-LOTRIMIN) vaginal cream 1 Applicatorful  1 Applicatorful Vaginal QHS Hill, Jackie Plum, MD       hydrOXYzine (ATARAX/VISTARIL) tablet 25 mg  25 mg Oral TID PRN Ajibola, Ene A, NP   25 mg at 09/04/21 0836   lactulose (CHRONULAC) 10 GM/15ML solution 20 g  20 g Oral BID PRN Maida Sale, MD       lamoTRIgine (LAMICTAL) tablet 25 mg  25 mg Oral Daily France Ravens, MD   25 mg at 09/04/21 2694   melatonin tablet 5 mg  5 mg Oral QHS Hill, Jackie Plum, MD   5 mg at 09/03/21 2102   mirtazapine (REMERON) tablet 15 mg  15 mg Oral Aliene Altes, MD       Derrill Memo ON 09/05/2021] pantoprazole (PROTONIX) EC tablet 40 mg  40 mg Oral Daily Niyam Bisping E, MD       polyethylene glycol (MIRALAX / GLYCOLAX) packet 17 g  17 g Oral Daily PRN France Ravens, MD       risperiDONE (RISPERDAL) tablet 1 mg  1 mg Oral QHS France Ravens, MD       Vitamin D (Ergocalciferol) (DRISDOL) capsule 50,000 Units  50,000 Units Oral Q7 days Maida Sale, MD   50,000 Units at 09/04/21 8546    Lab Results:  Results for orders placed or performed during the hospital encounter of 09/02/21 (from the past 48 hour(s))  Magnesium     Status: None   Collection Time:  09/03/21  6:39 AM  Result Value Ref Range   Magnesium 2.2 1.7 - 2.4 mg/dL    Comment: Performed at [facility], [place] [address] [place], [place] [address]  Basic metabolic panel     Status: Abnormal   Collection Time: 09/03/21  6:39 AM  Result Value Ref Range   Sodium 137 135 - 145 mmol/L   Potassium 3.7 3.5 - 5.1 mmol/L   Chloride 106 98 - 111 mmol/L   CO2 23 22 - 32 mmol/L   Glucose, Bld 152 (H) 70 - 99 mg/dL    Comment: Glucose reference range applies only to samples taken after fasting for at least 8 hours.   BUN 14 6 - 20 mg/dL   Creatinine, Ser 1.01 (H) 0.44 - 1.00 mg/dL   Calcium 8.9 8.9 - 10.3 mg/dL   GFR, Estimated >60 >60 mL/min    Comment: (NOTE) Calculated using the CKD-EPI Creatinine Equation (2021)    Anion gap 8 5 - 15    Comment: Performed at [facility], [place] [address] [place], [place] [address]  Ammonia     Status: Abnormal   Collection Time: 09/03/21  6:39 AM  Result Value Ref Range   Ammonia 38 (H) 9 - 35 umol/L    Comment: Performed at [facility], [place] [address], [place], [place] [address]  VITAMIN D 25 Hydroxy (Vit-D Deficiency, Fractures)     Status: Abnormal   Collection Time: 09/03/21  6:39 AM  Result Value Ref Range   Vit D, 25-Hydroxy 7.23 (L) 30 - 100 ng/mL    Comment: (NOTE) Vitamin D deficiency has been defined by the [place]  practice guideline as a level of serum 25-OH  vitamin D less than 20 ng/mL (1,2). The Endocrine Society went on to  further define vitamin D insufficiency as a level between 21 and 29  ng/mL (2).  1. IOM (Institute of Medicine). 2010. Dietary reference intakes for  calcium and D. [place]: The [employer]. 2. Holick MF, Binkley Hutchinson, Bischoff-Ferrari HA, et al. Evaluation,  treatment, and prevention of vitamin D deficiency: an Endocrine  Society clinical practice guideline, JCEM. 2011 Jul; 96(7):  1911-30.  Performed at [facility], [place] [address], [place], [place] [address]   RPR     Status: None   Collection Time: 09/03/21  6:39 AM  Result Value Ref Range   RPR Ser Ql NON REACTIVE NON REACTIVE    Comment: Performed at [facility], [place] [address] [place], [place] [address]  TSH     Status: Abnormal   Collection Time: 09/03/21  6:39 AM  Result Value Ref Range   TSH 5.075 (H) 0.350 - 4.500 uIU/mL    Comment: Performed by a 3rd Generation assay with a functional sensitivity of <=0.01 uIU/mL. Performed at [facility], [place] [address] [place], [place] [address]   Lipid panel     Status: Abnormal   Collection Time: 09/03/21  6:39 AM  Result Value Ref Range   Cholesterol 230 (H) 0 - 200 mg/dL   Triglycerides 111 <150 mg/dL   HDL 62 >40 mg/dL   Total CHOL/HDL Ratio 3.7 RATIO   VLDL 22 0 - 40 mg/dL   LDL Cholesterol 146 (H) 0 - 99 mg/dL    Comment:        Total Cholesterol/HDL:CHD Risk Coronary Heart Disease Risk Table                     Men   Women  1/2 Average Risk   3.4   3.3  Average Risk       5.0   4.4  2 X Average Risk   9.6   7.1  3 X Average Risk  23.4   11.0        Use the calculated Patient Ratio above and the CHD Risk Table to determine the patient's CHD Risk.        ATP III CLASSIFICATION (LDL):  <100     mg/dL   Optimal  100-129  mg/dL   Near or Above                    Optimal  130-159  mg/dL   Borderline  160-189  mg/dL   High  >190     mg/dL   Very High Performed at [place] [address] [place], [place] [address]   Vitamin B12     Status: None   Collection Time: 09/03/21  6:39 AM  Result Value Ref Range   Vitamin B-12 182 180 - 914 pg/mL    Comment: (NOTE) This assay is not validated for testing neonatal or myeloproliferative syndrome specimens for Vitamin B12 levels. Performed at [facility], [place] [address], [place], [place] [address]   Hemoglobin A1c      Status: Abnormal   Collection Time: 09/03/21  6:46 PM  Result Value Ref Range   Hgb A1c MFr Bld 5.7 (H) 4.8 - 5.6 %    Comment: (NOTE) Pre diabetes:          5.7%-6.4%  Diabetes:              >6.4%  Glycemic control for   <7.0% adults with diabetes    Mean Plasma Glucose 116.89 mg/dL    Comment: Performed at [facility] [address], [place], [place] [address]  T4, free     Status: None   Collection Time: 09/03/21  6:46 PM  Result Value Ref Range   Free T4 0.70 0.61 - 1.12 ng/dL    Comment: (NOTE) Biotin ingestion may interfere with free T4 tests. If the results are inconsistent with the TSH level, previous test results, or the clinical presentation, then consider biotin interference. If needed, order repeat testing after stopping biotin. Performed at [facility], [place] [address] [place], [place] [address]   T3, free     Status: None   Collection Time: 09/03/21  6:46 PM  Result Value Ref Range   T3, Free 2.2 2.0 - 4.4 pg/mL    Comment: (NOTE) Performed At: [facility] [place], [place] [id number] Rush Farmer MD VO:[id number]     Blood Alcohol level:  Lab Results  Component Value Date   ETH <10 09/02/2021   ETH 33 (H) 99/37/1696    Metabolic Disorder Labs: Lab Results  Component Value Date   HGBA1C 5.7 (H) 09/03/2021   MPG 116.89 09/03/2021   MPG 116.89 01/13/2020   No results found for: PROLACTIN Lab Results  Component Value Date   CHOL 230 (H) 09/03/2021   TRIG 111 09/03/2021   HDL 62 09/03/2021   CHOLHDL 3.7 09/03/2021   VLDL 22 09/03/2021   LDLCALC 146 (H) 09/03/2021   LDLCALC 114 (H) 01/13/2020    Physical Findings:   Musculoskeletal: Strength & Muscle Tone: within normal limits Gait & Station: normal Patient leans: N/A  Psychiatric Specialty Exam:  Presentation  General Appearance: Casual  Eye Contact:Minimal  Speech:Clear and Coherent  Speech Volume:Normal  Handedness:No data  recorded  Mood and Affect  Mood:Depressed  Affect:Depressed   Thought Process  Thought Processes:Coherent; Goal Directed; Linear  Descriptions of Associations:Intact  Orientation:Full (Time, Place and Person)  Thought Content:Reports paranoia and VH; denies AH, ideas of reference, first rank symptoms and is not grossly responding to internal/external stimuli on exam  History of Schizophrenia/Schizoaffective disorder:No  Duration of Psychotic Symptoms:No data recorded Hallucinations:Hallucinations: Visual Description of Visual Hallucinations: Patient reports seeing a woman standing in the group room and by her window when she was sleeping  Ideas of Reference:None  Suicidal Thoughts:Passive but can contract for safety on the unit  Homicidal Thoughts:Homicidal Thoughts: No   Sensorium  Memory:Immediate Good; Recent Good; Remote Good  Judgment:Fair  Insight:Fair   Executive Functions  Concentration:Fair  Attention Span:Fair  [place] of Knowledge:Fair  Language:Fair   Psychomotor Activity  Psychomotor Activity:Psychomotor Activity: Normal   Assets  Assets:Communication Skills; Desire for Improvement; Resilience   Sleep  Sleep:Sleep: Poor Number of Hours of Sleep: 5    Physical Exam Vitals and nursing note reviewed.  Constitutional:      Appearance: Normal appearance. She is normal weight.  HENT:     Head: Normocephalic and atraumatic.  Pulmonary:     Effort: Pulmonary effort is normal.  Neurological:     General: No focal deficit present.     Mental Status: She is oriented to person, place, and time.   Review of Systems  Respiratory:  Negative for shortness of breath.   Cardiovascular:  Negative for chest pain.  Gastrointestinal:  Negative for abdominal pain, constipation, diarrhea, heartburn, nausea and vomiting.  Neurological:  Negative for headaches.  Blood pressure (!) 136/91, pulse 91, temperature 98.5 F (36.9 C), resp. rate  18, height 5' 8.5" (1.74 m), weight 88.5 kg, SpO2 100 %. Body mass index is 29.22 kg/m.   Treatment Plan Summary: Daily contact with patient to assess and evaluate symptoms and progress in treatment and Medication management  ASSESSMENT Patient is a 55 year old female with reported psychiatric history of depression, bipolar, PTSD admitted to [facility] from [facility] ED due to worsening suicidal ideation and suicide attempt with Advil overdose and smoking crack.  Patient appears to have no change in mood since yesterday and poor sleep.  Uncertain whether this is her bipolar disorder or withdrawing from crack at this time.  Patient also endorsing new psychotic symptoms including visual hallucinations of woman by door in group room so plan to add Risperdal to manage psychotic symptoms.  PLAN Psychiatric Problems Bipolar Disorder, Type II depressive episode severe with psychotic features (r/o MDD -severe, r/o substance induced mood d/o) PTSD by hx -Increase Remeron 15 nightly for PTSD and sleep.  R/B/SE discussed with patient patient is agreeable to medication trial -Continue Lamictal 25 mg daily for mood stability and bipolar disorder titrating up over time.  R/B/SE discussed with patient and patient agreeable to medication trial -Continue Melatonin 5 mg, for sleep -Start Risperdal 1 mg qhs for psychotic symptoms.  R/B/SE discussed with patient and patient agreeable to medication trial.  -Plan for ECG tomorrow to assess Qtc; A1c 5.7, Cholesterol 230 and LDL 146  Stimulant use d/o - cocaine type Alcohol use d/o Cannabis use d/o - Counseled on need to abstain from substances and she is interested in residential rehab after discharge  Medical Problems Magnesium 2.2, creatinine 1.01, ammonia 38, vitamin D 7.23, negative RPR, TSH 5.075  Elevated TSH, likely 2/2 stress response (r/o subclinical hypothyroidism) Free T4 0.7, Free T3 2.2 both WNL - Will need repeat TSH in 4-6 weeks  Vitamin D  Deficiency -Cholecalciferol 50000 units every week  Hyperammonemia and constipation Lactulose 10 g bid and repeat ammonia level in morning Monitor and add miralax PRN if constipation dose not resolve with Lactulose  Leukocytosis - rechecking CBC for trending of WBC tomorrow  Elevated HCG - Checking UPT and repeating HCG for trending  Abnormal UA (moderate blood, 100 protein, small leukocytes) - Rechecking clean catch UA - patient currently asymptomatic  Chest Pain with Prolonged QTC prior to admission -Likely musculoskeletal in nature -Most recent QTC 447 and rechecking EKG tomorrow for trending with start of Risperdal -We will continue to monitor   Hypercholesteremia - Cholesterol 230 and LDL 146 - will verify and restart home cholesterol med   Asthma -Albuterol inhaler prn for wheezing/shortness of breath - CXR negative   HTN - Restart home BP med   Vertigo -We will continue to monitor -Recommend PCP follow-up to monitor vertigo  Refractory Reported Yeast Infection Clotrimazole cream qhs x3days  PRNs Tylenol 650 mg for mild pain Maalox/Mylanta 30 mL for indigestion Bendaryl q6hr for itching/allergies Hydroxyzine 25 mg tid for anxiety Milk of Magnesia 30 mL for constipation   3. Safety and Monitoring: Voluntary admission to inpatient psychiatric unit for safety, stabilization and treatment Daily contact with patient to assess and evaluate symptoms and progress in treatment Patient's case to be discussed in multi-disciplinary team meeting Observation Level : q15 minute checks Vital signs: q12 hours Precautions: suicide, elopement, and assault  4. Discharge Planning: Social work and case management to assist with discharge planning and identification of hospital follow-up needs prior to discharge Estimated LOS: 5-7 days Discharge Concerns: Need to establish a safety plan; Medication compliance and effectiveness Discharge Goals: Return home with outpatient  referrals for mental health follow-up including medication management/psychotherapy  France Ravens, MD 09/04/2021, 6:31 PM

## 2021-09-04 NOTE — Progress Notes (Signed)
Patient c/o sleep disturbances at 0018, prn Atarax 25 mg PO given and effective. Patient stated she slept after dinner. Continues to endorse Passive SI, but verbally contracted for safety. Denies HI/AH, stated she has been seeing someone come in her room but there will not be anyone in the room. Support and encouragement provided as needed.

## 2021-09-05 DIAGNOSIS — F3181 Bipolar II disorder: Secondary | ICD-10-CM | POA: Diagnosis not present

## 2021-09-05 LAB — CBC WITH DIFFERENTIAL/PLATELET
Abs Immature Granulocytes: 0.02 10*3/uL (ref 0.00–0.07)
Basophils Absolute: 0.1 10*3/uL (ref 0.0–0.1)
Basophils Relative: 1 %
Eosinophils Absolute: 0.3 10*3/uL (ref 0.0–0.5)
Eosinophils Relative: 4 %
HCT: 37.7 % (ref 36.0–46.0)
Hemoglobin: 11.8 g/dL — ABNORMAL LOW (ref 12.0–15.0)
Immature Granulocytes: 0 %
Lymphocytes Relative: 37 %
Lymphs Abs: 2.6 10*3/uL (ref 0.7–4.0)
MCH: 27.3 pg (ref 26.0–34.0)
MCHC: 31.3 g/dL (ref 30.0–36.0)
MCV: 87.1 fL (ref 80.0–100.0)
Monocytes Absolute: 0.5 10*3/uL (ref 0.1–1.0)
Monocytes Relative: 7 %
Neutro Abs: 3.6 10*3/uL (ref 1.7–7.7)
Neutrophils Relative %: 51 %
Platelets: 250 10*3/uL (ref 150–400)
RBC: 4.33 MIL/uL (ref 3.87–5.11)
RDW: 17.8 % — ABNORMAL HIGH (ref 11.5–15.5)
WBC: 7 10*3/uL (ref 4.0–10.5)
nRBC: 0 % (ref 0.0–0.2)

## 2021-09-05 LAB — URINALYSIS, COMPLETE (UACMP) WITH MICROSCOPIC
Bilirubin Urine: NEGATIVE
Glucose, UA: NEGATIVE mg/dL
Ketones, ur: NEGATIVE mg/dL
Nitrite: NEGATIVE
Protein, ur: NEGATIVE mg/dL
Specific Gravity, Urine: 1.004 — ABNORMAL LOW (ref 1.005–1.030)
pH: 7 (ref 5.0–8.0)

## 2021-09-05 LAB — PREGNANCY, URINE: Preg Test, Ur: NEGATIVE

## 2021-09-05 LAB — HCG, QUANTITATIVE, PREGNANCY: hCG, Beta Chain, Quant, S: 6 m[IU]/mL — ABNORMAL HIGH (ref <5)

## 2021-09-05 LAB — AMMONIA: Ammonia: 31 umol/L (ref 9–35)

## 2021-09-05 MED ORDER — PRAZOSIN HCL 1 MG PO CAPS
1.0000 mg | ORAL_CAPSULE | Freq: Every day | ORAL | Status: DC
  Administered 2021-09-05 – 2021-09-06 (×2): 1 mg via ORAL
  Filled 2021-09-05 (×4): qty 1

## 2021-09-05 MED ORDER — RISPERIDONE 2 MG PO TABS
2.0000 mg | ORAL_TABLET | Freq: Every day | ORAL | Status: DC
  Administered 2021-09-05 – 2021-09-12 (×8): 2 mg via ORAL
  Filled 2021-09-05 (×11): qty 1

## 2021-09-05 NOTE — BHH Counselor (Signed)
CSW faxed the Pt's information to [facility] on 09/05/2021.  CSW received a call today stating that [facility] would not accept the Pt because at this time she is considered to have "more of a mental health diagnosis then a substance use diagnosis".  The treatment center suggested that the [facility] contact their sister company, [facility], in [place].  CSW will discuss this option with the Pt and then send the referral the [facility] on 09/06/2021.

## 2021-09-05 NOTE — Group Note (Signed)
LCSW Group Therapy Note   Group Date: 09/05/2021 Start Time: 1300 End Time: 1330   Type of Therapy and Topic:  Group Therapy:   Therapy Type: Group Therapy  Participation Level:  Minimal   Patients received a worksheet with an outline of 2 gingerbread men with a separation in the middle of the page. One sign designated what the pt sees about themselves and the other is what others see. Pts were asked to introduce themselves and share something they like about themself. Pts were then asked to draw, write or color how they view themselves as well as how they are viewed by others. CSW led discussion about the feelings and words associated with each side.   Patient Summary:   During introductions pt shared their name and stated they liked how they can isolate when around others about themselves. Pt was appropriate and but did not participate in group discussion.     Mliss Fritz, LCSWA 09/05/2021  1:33 PM

## 2021-09-05 NOTE — Progress Notes (Signed)
   09/05/21 1000  Psych Admission Type (Psych Patients Only)  Admission Status Voluntary  Psychosocial Assessment  Patient Complaints Anxiety  Eye Contact Fair  Facial Expression Blank  Affect Flat  Speech Logical/coherent  Interaction Assertive  Motor Activity Other (Comment) (WNL)  Appearance/Hygiene Unremarkable  Behavior Characteristics Cooperative  Mood Depressed  Thought Process  Coherency Circumstantial  Content Blaming self  Delusions Other (Comment)  Perception WDL  Hallucination None reported or observed  Judgment Poor  Confusion WDL  Danger to Self  Current suicidal ideation? Passive  Self-Injurious Behavior No self-injurious ideation or behavior indicators observed or expressed   Agreement Not to Harm Self Yes  Description of Agreement Verbal  Danger to Others  Danger to Others None reported or observed

## 2021-09-05 NOTE — Progress Notes (Signed)
The patient was unable to come up with a coping skill to be used outside of the hospital. Her goal for tomorrow is to make a decision regarding where she will be going to rehab.

## 2021-09-05 NOTE — Progress Notes (Addendum)
[facility] MD Progress Note  09/05/2021 6:05 PM Denise Knight  MRN:  [id number] Subjective:  Patient is a 55 year old female with reported psychiatric history of depression, bipolar, PTSD admitted to [facility] from [facility] ED due to worsening suicidal ideation and suicide attempt with Advil overdose.  Chart Review, 24 hr Events: The patient's chart was reviewed and nursing notes were reviewed. The patient's case was discussed in multidisciplinary team meeting.  Per MAR: - Patient is compliant with scheduled meds. - PRNs: albuterol, hydroxyzine x2, lactulose Per RN notes, no documented behavioral issues and is attending group. Patient slept, 6.75 hours  Patient had the following psychiatric recommendations yesterday: -Remeron 15 nightly for PTSD and sleep.   -Continue Lamictal 25 mg daily for mood stability and bipolar disorder titrating up over time.  -Continue Melatonin 5 mg, for sleep -Start Risperdal 1 mg qhs for psychotic symptoms.  TODAY'S INTERVIEW Patient seen and assessed with attending Dr. Nelda Marseille.  Patient states that her mood has mildly improved since yesterday.  Patient feels that she is more alert and less depressed.  Patient does state that she just feels "tired".  Patient states that she did not sleep very well last night and woke up in the middle of the night pacing.  Patient states that she went back to bed after taking a hydroxyzine which did help mildly allowing her to fall asleep a few more hours prior to waking yet again.  Patient is uncertain exactly what causes her insomnia.  Patient states she had not taken any naps yesterday and yet still felt that she could not sleep well during the nighttime.  Discussed with patient that she would likely need increased dosages of her present medications as well as discussed option to add prazosin.  Patient states that prazosin would likely help as she has had nightmares the past few nights where she would stare her roommate because  she would jump out of bed after awakening from the nightmare.  Patient is also agreeable to increasing her Risperdal in order to help with her racing thoughts.  Patient states that her suicidal ideations are "lessening" and that she is feeling better than when she was first admitted.  Patient also endorses that she has not had a good bowel movement since being admitted.  Patient does admit she has hemorrhoids which may be contributing to her inability to have a bowel movement.  Patient was like to try MiraLAX for her constipation and then reassess tomorrow.    Patient denies present HI/AVH.  Patient denies first rank symptoms, ideas of reference, thought broadcasting.  Patient denies seeing people by the window when she is sleeping or strangers in the group room that others cannot see.  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 ([place]) Active Problems:   Cocaine abuse ([place])   Alcohol use disorder, moderate, dependence ([place])   Cannabis dependence with current use ([place])   Vitamin D deficiency  Total Time spent with patient: 25 minutes  Past Psychiatric History: see H&P  Past Medical History:  Past Medical History:  Diagnosis Date   Allergy    seasonal   Anxiety    Arthritis    "left knee" (07/05/2016)   Asthma    Bipolar 1 disorder ([place])    Depression    Diabetes mellitus without complication ([place])    pre-   GERD (gastroesophageal reflux disease)    Hypertension  MI (mitral incompetence)    Post traumatic stress disorder    Schizophrenia ([place])    Stomach ulcer    Substance abuse ([place])    cocaine quit july 2020   Suicide attempt [facility])     Past Surgical History:  Procedure Laterality Date   CARDIAC CATHETERIZATION  07/05/2016   CARDIAC CATHETERIZATION N/A 07/05/2016   Procedure: Left Heart Cath and Coronary Angiography;  Surgeon: Adrian Prows, MD;   Location: [place] [facility];  Service: Cardiovascular;  Laterality: N/A;   CYST EXCISION Right    "wrist"   DILATION AND CURETTAGE OF UTERUS     FOOT SURGERY Bilateral    "corns removed"   LEFT HEART CATH AND CORONARY ANGIOGRAPHY N/A 12/24/2017   Procedure: LEFT HEART CATH AND CORONARY ANGIOGRAPHY;  Surgeon: Nigel Mormon, MD;  Location: [place] [facility];  Service: Cardiovascular;  Laterality: N/A;   TUBAL LIGATION     Family History:  Family History  Problem Relation Age of Onset   Breast cancer Other    Lung cancer Other    Congestive Heart Failure Other    Diabetes Other    Hypertension Other    Colon cancer Neg Hx    Colon polyps Neg Hx    Esophageal cancer Neg Hx    Stomach cancer Neg Hx    Rectal cancer Neg Hx    Family Psychiatric  History: seee H&P Social History:  Social History   Substance and Sexual Activity  Alcohol Use Yes   Alcohol/week: 7.0 standard drinks   Types: 7 Cans of beer per week     Social History   Substance and Sexual Activity  Drug Use Yes   Types: Marijuana    Social History   Socioeconomic History   Marital status: Widowed    Spouse name: Not on file   Number of children: 2   Years of education: Not on file   Highest education level: Not on file  Occupational History   Occupation: disabled   Tobacco Use   Smoking status: Some Days    Packs/day: 0.25    Years: 35.00    Pack years: 8.75    Types: Cigarettes    Last attempt to quit: 05/30/2019    Years since quitting: 2.2   Smokeless tobacco: Never  Vaping Use   Vaping Use: Never used  Substance and Sexual Activity   Alcohol use: Yes    Alcohol/week: 7.0 standard drinks    Types: 7 Cans of beer per week   Drug use: Yes    Types: Marijuana   Sexual activity: Yes    Birth control/protection: Surgical  Other Topics Concern   Not on file  Social History Narrative   Not on file   Social Determinants of Health   Financial Resource Strain: Not on file  Food  Insecurity: Not on file  Transportation Needs: Not on file  Physical Activity: Not on file  Stress: Not on file  Social Connections: Not on file   Additional Social History:                         Sleep: Poor  Appetite:  Fair  Current Medications: Current Facility-Administered Medications  Medication Dose Route Frequency Provider Last Rate Last Admin   acetaminophen (TYLENOL) tablet 650 mg  650 mg Oral Q6H PRN Ajibola, Ene A, NP   650 mg at 09/03/21 1853   albuterol (VENTOLIN HFA) 108 (90 Base) MCG/ACT inhaler  1-2 puff  1-2 puff Inhalation Q6H PRN France Ravens, MD   2 puff at 09/04/21 1252   alum & mag hydroxide-simeth (MAALOX/MYLANTA) 200-200-20 MG/5ML suspension 30 mL  30 mL Oral Q4H PRN Ajibola, Ene A, NP       amLODipine (NORVASC) tablet 10 mg  10 mg Oral Daily Nelda Marseille, Tasha Jindra E, MD   10 mg at 09/05/21 0827   B-complex with vitamin C tablet 1 tablet  1 tablet Oral Daily Maida Sale, MD   1 tablet at 09/05/21 7001   clotrimazole (GYNE-LOTRIMIN) vaginal cream 1 Applicatorful  1 Applicatorful Vaginal QHS Maida Sale, MD   1 Applicatorful at 74/94/49 2116   hydrOXYzine (ATARAX/VISTARIL) tablet 25 mg  25 mg Oral TID PRN Ajibola, Ene A, NP   25 mg at 09/05/21 1449   lamoTRIgine (LAMICTAL) tablet 25 mg  25 mg Oral Daily France Ravens, MD   25 mg at 09/05/21 6759   melatonin tablet 5 mg  5 mg Oral QHS Hill, Jackie Plum, MD   5 mg at 09/04/21 2114   mirtazapine (REMERON) tablet 15 mg  15 mg Oral Aliene Altes, MD   15 mg at 09/04/21 2113   pantoprazole (PROTONIX) EC tablet 40 mg  40 mg Oral Daily Harlow Asa, MD   40 mg at 09/05/21 1638   polyethylene glycol (MIRALAX / GLYCOLAX) packet 17 g  17 g Oral Daily PRN France Ravens, MD   17 g at 09/05/21 4665   prazosin (MINIPRESS) capsule 1 mg  1 mg Oral QHS Nelda Marseille, Kalima Saylor E, MD       risperiDONE (RISPERDAL) tablet 2 mg  2 mg Oral QHS Nelda Marseille, Halimah Bewick E, MD       simvastatin (ZOCOR) tablet 40 mg  40 mg Oral q1800  Harlow Asa, MD       Vitamin D (Ergocalciferol) (DRISDOL) capsule 50,000 Units  50,000 Units Oral Q7 days Maida Sale, MD   50,000 Units at 09/04/21 9935    Lab Results:  Results for orders placed or performed during the hospital encounter of 09/02/21 (from the past 48 hour(s))  Hemoglobin A1c     Status: Abnormal   Collection Time: 09/03/21  6:46 PM  Result Value Ref Range   Hgb A1c MFr Bld 5.7 (H) 4.8 - 5.6 %    Comment: (NOTE) Pre diabetes:          5.7%-6.4%  Diabetes:              >6.4%  Glycemic control for   <7.0% adults with diabetes    Mean Plasma Glucose 116.89 mg/dL    Comment: Performed at [facility], [place] [address] [place], [place] [address]  T4, free     Status: None   Collection Time: 09/03/21  6:46 PM  Result Value Ref Range   Free T4 0.70 0.61 - 1.12 ng/dL    Comment: (NOTE) Biotin ingestion may interfere with free T4 tests. If the results are inconsistent with the TSH level, previous test results, or the clinical presentation, then consider biotin interference. If needed, order repeat testing after stopping biotin. Performed at [facility], [place] [address], [place], [place] [address]   T3, free     Status: None   Collection Time: 09/03/21  6:46 PM  Result Value Ref Range   T3, Free 2.2 2.0 - 4.4 pg/mL    Comment: (NOTE) Performed At: [place], [place]  [id number] Rush Farmer MD IR:[id number]   Pregnancy, urine     Status: None   Collection Time: 09/04/21 10:55 AM  Result Value Ref Range   Preg Test, Ur NEGATIVE NEGATIVE    Comment:        THE SENSITIVITY OF THIS METHODOLOGY IS >20 mIU/mL. Performed at [facility], [place] [address], [place], [place] [address]   Ammonia     Status: None   Collection Time: 09/05/21  6:36 AM  Result Value Ref Range   Ammonia 31 9 - 35 umol/L    Comment: Performed at [facility], [place] [address], [place],  [place] [address]  hCG, quantitative, pregnancy     Status: Abnormal   Collection Time: 09/05/21  6:36 AM  Result Value Ref Range   hCG, Beta Chain, Quant, S 6 (H) <5 mIU/mL    Comment:          GEST. AGE      CONC.  (mIU/mL)   <=1 WEEK        5 - 50     2 WEEKS       50 - 500     3 WEEKS       100 - 10,000     4 WEEKS     1,000 - 30,000     5 WEEKS     3,500 - 115,000   6-8 WEEKS     12,000 - 270,000    12 WEEKS     15,000 - 220,000        FEMALE AND NON-PREGNANT FEMALE:     LESS THAN 5 mIU/mL Performed at [facility], [place] [address], [place],  [address]   CBC with Differential/Platelet     Status: Abnormal   Collection Time: 09/05/21  6:36 AM  Result Value Ref Range   WBC 7.0 4.0 - 10.5 K/uL   RBC 4.33 3.87 - 5.11 MIL/uL   Hemoglobin 11.8 (L) 12.0 - 15.0 g/dL   HCT 37.7 36.0 - 46.0 %   MCV 87.1 80.0 - 100.0 fL   MCH 27.3 26.0 - 34.0 pg   MCHC 31.3 30.0 - 36.0 g/dL   RDW 17.8 (H) 11.5 - 15.5 %   Platelets 250 150 - 400 K/uL   nRBC 0.0 0.0 - 0.2 %   Neutrophils Relative % 51 %   Neutro Abs 3.6 1.7 - 7.7 K/uL   Lymphocytes Relative 37 %   Lymphs Abs 2.6 0.7 - 4.0 K/uL   Monocytes Relative 7 %   Monocytes Absolute 0.5 0.1 - 1.0 K/uL   Eosinophils Relative 4 %   Eosinophils Absolute 0.3 0.0 - 0.5 K/uL   Basophils Relative 1 %   Basophils Absolute 0.1 0.0 - 0.1 K/uL   Immature Granulocytes 0 %   Abs Immature Granulocytes 0.02 0.00 - 0.07 K/uL    Comment: Performed at [facility], [place] [address] [place],  [address]  Urinalysis, Complete w Microscopic Urine, Clean Catch     Status: Abnormal   Collection Time: 09/05/21 10:58 AM  Result Value Ref Range   Color, Urine YELLOW YELLOW   APPearance CLOUDY (A) CLEAR   Specific Gravity, Urine 1.004 (L) 1.005 - 1.030   pH 7.0 5.0 - 8.0   Glucose, UA NEGATIVE NEGATIVE mg/dL   Hgb urine dipstick SMALL (A) NEGATIVE   Bilirubin Urine NEGATIVE NEGATIVE   Ketones, ur NEGATIVE NEGATIVE mg/dL    Protein, ur NEGATIVE  NEGATIVE mg/dL   Nitrite NEGATIVE NEGATIVE   Leukocytes,Ua TRACE (A) NEGATIVE   RBC / HPF 6-10 0 - 5 RBC/hpf   WBC, UA 0-5 0 - 5 WBC/hpf   Bacteria, UA RARE (A) NONE SEEN   Squamous Epithelial / LPF 11-20 0 - 5   Mucus PRESENT     Comment: Performed at [facility], [place] [address], [place], [place] [address]    Blood Alcohol level:  Lab Results  Component Value Date   ETH <10 09/02/2021   ETH 33 (H) 86/76/7209    Metabolic Disorder Labs: Lab Results  Component Value Date   HGBA1C 5.7 (H) 09/03/2021   MPG 116.89 09/03/2021   MPG 116.89 01/13/2020   No results found for: PROLACTIN Lab Results  Component Value Date   CHOL 230 (H) 09/03/2021   TRIG 111 09/03/2021   HDL 62 09/03/2021   CHOLHDL 3.7 09/03/2021   VLDL 22 09/03/2021   LDLCALC 146 (H) 09/03/2021   LDLCALC 114 (H) 01/13/2020    Physical Findings:   Musculoskeletal: Strength & Muscle Tone: within normal limits Gait & Station: normal Patient leans: N/A  Psychiatric Specialty Exam:  Presentation  General Appearance: Casual  Eye Contact:Good  Speech:Clear and Coherent  Speech Volume:Normal, regular rate  Handedness:No data recorded  Mood and Affect  Mood:anxious, dypshoric  Affect:Congruent   Thought Process  Thought Processes:Coherent; Goal Directed; Linear  Descriptions of Associations:Intact  Orientation:Full (Time, Place and Person)  Thought Content:Denies VH today; denies AH, ideas of reference, first rank symptoms and is not grossly responding to internal/external stimuli on exam but guarded on exam  History of Schizophrenia/Schizoaffective disorder:No  Duration of Psychotic Symptoms:No data recorded Hallucinations:Denied today  Ideas of Reference:None  Suicidal Thoughts:Passive but can contract for safety on the unit  Homicidal Thoughts:Homicidal Thoughts: No   Sensorium  Memory:Immediate Good; Recent Good; Remote  Good  Judgment:Fair  Insight:Fair   Executive Functions  Concentration:Fair  Attention Span:Fair  [place]   Psychomotor Activity  Psychomotor Activity:Psychomotor Activity: Normal   Assets  Assets:Communication Skills; Desire for Improvement; Resilience   Sleep  Sleep:Sleep: Fair Number of Hours of Sleep: 6.75    Physical Exam Vitals and nursing note reviewed.  Constitutional:      Appearance: Normal appearance. She is normal weight.  HENT:     Head: Normocephalic and atraumatic.  Pulmonary:     Effort: Pulmonary effort is normal.  Neurological:     General: No focal deficit present.     Mental Status: She is oriented to person, place, and time.   Review of Systems  Respiratory:  Negative for shortness of breath.   Cardiovascular:  Negative for chest pain.  Gastrointestinal:  Negative for abdominal pain, constipation, diarrhea, heartburn, nausea and vomiting.  Neurological:  Negative for headaches.  Blood pressure 103/61, pulse 87, temperature 98.6 F (37 C), resp. rate 18, height 5' 8.5" (1.74 m), weight 88.5 kg, SpO2 100 %. Body mass index is 29.22 kg/m.   Treatment Plan Summary: Daily contact with patient to assess and evaluate symptoms and progress in treatment and Medication management  ASSESSMENT Patient is a 55 year old female with reported psychiatric history of depression, bipolar, PTSD admitted to [facility] from [facility] ED due to worsening suicidal ideation and suicide attempt with Advil overdose and smoking crack.  Patient appears to have no change in mood since yesterday and poor sleep.  Uncertain whether this is her bipolar disorder or withdrawing from  crack at this time.  Patient endorsing improved mood but continued racing thoughts.  Patient also endorsing nightmares past few nights which is likely contributing to her insomnia.  Patient agreeable to taking prazosin, and increasing Risperdal  dose.  PLAN Psychiatric Problems Bipolar Disorder, Type II depressive episode severe with psychotic features (r/o MDD -severe, r/o substance induced mood d/o) PTSD by hx -Remeron 15 nightly for PTSD and sleep -Lamictal 25 mg daily for mood stability and bipolar disorder titrating up over time. -Melatonin 5 mg, for sleep -Increase Risperdal to 2 mg qhs for psychotic symptoms.  R/B/SE discussed with patient and patient agreeable to medication trial.  -Qtc (10/26) 452; A1c 5.7, Cholesterol 230 and LDL 146  Stimulant use d/o - cocaine type Alcohol use d/o Cannabis use d/o - Counseled on need to abstain from substances and she is interested in residential rehab after discharge  Medical Problems Magnesium 2.2, creatinine 1.01, ammonia 38, vitamin D 7.23, negative RPR, TSH 5.075  Elevated TSH, likely 2/2 stress response (r/o subclinical hypothyroidism) Free T4 0.7, Free T3 2.2 both WNL - Will need repeat TSH in 4-6 weeks  Vitamin D Deficiency -Cholecalciferol 50000 units every week  Hyperammonemia (RESOLVED) and constipation Discontinued Lactulose 10 g bid Ammonia 38 (10/24) -> 31 (10/26), normalized Miralax for contipation  Leukocytosis, RESOLVED - WBC downtrending to 7.0  Normocytic Anemia Hgb 11.8 -Will continue to monitor  Elevated HCG -Negative UPT Quantitative hCG 6, likely menopausal changes- will need GYN f/u after discharge  Abnormal UA (cloudy, 1.004, small hgb, negative protein, trace leucocyte, rare bacteria) -Urine Culture reflex pending  Chest Pain with Prolonged QTC prior to admission -Likely musculoskeletal in nature -Most recent QTC 452 -will continue to monitor   Hypercholesteremia - Cholesterol 230 and LDL 146 - will verify and restart home cholesterol med   Asthma -Albuterol inhaler prn for wheezing/shortness of breath - CXR negative   HTN - Restart home BP med   Vertigo -We will continue to monitor -Recommend PCP follow-up to monitor  vertigo  Refractory Reported Yeast Infection Clotrimazole cream qhs x3days  PRNs Tylenol 650 mg for mild pain Maalox/Mylanta 30 mL for indigestion Bendaryl q6hr for itching/allergies Hydroxyzine 25 mg tid for anxiety Milk of Magnesia 30 mL for constipation   3. Safety and Monitoring: Voluntary admission to inpatient psychiatric unit for safety, stabilization and treatment Daily contact with patient to assess and evaluate symptoms and progress in treatment Patient's case to be discussed in multi-disciplinary team meeting Observation Level : q15 minute checks Vital signs: q12 hours Precautions: suicide, elopement, and assault   4. Discharge Planning: Social work and case management to assist with discharge planning and identification of hospital follow-up needs prior to discharge Estimated LOS: 5-7 days Discharge Concerns: Need to establish a safety plan; Medication compliance and effectiveness Discharge Goals: Return home with outpatient referrals for mental health follow-up including medication management/psychotherapy  France Ravens, MD 09/05/2021, 6:05 PM

## 2021-09-05 NOTE — Group Note (Signed)
Recreation Therapy Group Note   Group Topic:Stress Management  Group Date: 09/05/2021 Start Time: 0935 End Time: 0953 Facilitators: Victorino Sparrow, LRT/CTRS Location: [address] Dayroom   Goal Area(s) Addresses:  Patient will actively participate in stress management techniques presented during session.  Patient will successfully identify benefit of practicing stress management post d/c.    Group Description: Guided Imagery. LRT provided education, instruction, and demonstration on practice of visualization via guided imagery. Patient was asked to participate in the technique introduced during session. Patients were given suggestions of ways to access scripts post d/c and encouraged to explore Youtube and other apps available on smartphones, tablets, and computers.   Affect/Mood: N/A   Participation Level: Did not attend    Clinical Observations/Individualized Feedback: Pt did not attend group.    Plan: Continue to engage patient in RT group sessions 2-3x/week.   Victorino Sparrow, LRT/CTRS 09/05/2021 12:01 PM

## 2021-09-06 DIAGNOSIS — F3181 Bipolar II disorder: Secondary | ICD-10-CM | POA: Diagnosis not present

## 2021-09-06 MED ORDER — MAGNESIUM HYDROXIDE 400 MG/5ML PO SUSP
15.0000 mL | Freq: Every day | ORAL | Status: DC | PRN
  Administered 2021-09-10 – 2021-09-11 (×2): 15 mL via ORAL
  Filled 2021-09-06 (×3): qty 30

## 2021-09-06 MED ORDER — NITROFURANTOIN MONOHYD MACRO 100 MG PO CAPS
100.0000 mg | ORAL_CAPSULE | Freq: Every day | ORAL | Status: DC
  Filled 2021-09-06: qty 1

## 2021-09-06 MED ORDER — TRAZODONE HCL 50 MG PO TABS
50.0000 mg | ORAL_TABLET | Freq: Every day | ORAL | Status: DC
  Administered 2021-09-06: 50 mg via ORAL
  Filled 2021-09-06 (×3): qty 1

## 2021-09-06 MED ORDER — NITROFURANTOIN MONOHYD MACRO 100 MG PO CAPS
100.0000 mg | ORAL_CAPSULE | Freq: Two times a day (BID) | ORAL | Status: AC
  Administered 2021-09-06 – 2021-09-11 (×10): 100 mg via ORAL
  Filled 2021-09-06 (×11): qty 1

## 2021-09-06 NOTE — Progress Notes (Signed)
Pt stated her SI thoughts were decreasing, pt visible on the unit this evening    09/06/21 2200  Psych Admission Type (Psych Patients Only)  Admission Status Voluntary  Psychosocial Assessment  Patient Complaints Anxiety  Eye Contact Fair  Facial Expression Anxious  Affect Other (Comment) (improved)  Speech Logical/coherent  Interaction Assertive  Motor Activity Other (Comment) (WNL)  Appearance/Hygiene Unremarkable  Behavior Characteristics Cooperative  Mood Pleasant  Thought Process  Coherency WDL  Content WDL  Delusions WDL  Perception WDL  Hallucination None reported or observed  Judgment WDL  Confusion WDL  Danger to Self  Current suicidal ideation? Passive  Self-Injurious Behavior Some self-injurious ideation observed or expressed.  No lethal plan expressed   Agreement Not to Harm Self Yes  Description of Agreement verbal

## 2021-09-06 NOTE — BHH Counselor (Signed)
CSW contacted [facility] in [place] to seek admission for the Pt.  CSW was informed that they do not take [employer] insurance at this time.

## 2021-09-06 NOTE — BHH Group Notes (Signed)
Patient did not attend the relaxation group.

## 2021-09-06 NOTE — Progress Notes (Addendum)
[facility] MD Progress Note  09/06/2021 6:52 AM Denise Knight  MRN:  [id number] Subjective:  Patient is a 55 year old female with reported psychiatric history of depression, bipolar, PTSD admitted to [facility] from [facility] ED due to worsening suicidal ideation and suicide attempt with Advil overdose.  Chart Review, 24 hr Events: The patient's chart was reviewed and nursing notes were reviewed. The patient's case was discussed in multidisciplinary team meeting.  Per MAR: - Patient is compliant with scheduled meds. - PRNs: albuterol, hydroxyzine x3, miralax Per RN notes, no documented behavioral issues and is attending group. Patient slept, 6.25 hours  Patient had the following psychiatric recommendations yesterday: -Remeron 15 nightly for PTSD and sleep -Lamictal 25 mg daily for mood stability and bipolar disorder titrating up over time. -Melatonin 5 mg, for sleep -Prazosin 1 mg qhs for nightmares -Risperdal 2 mg qhs for psychotic symptoms.   TODAY'S INTERVIEW Patient seen and assessed with attending Dr. Nelda Marseille.  Patient states she is doing well.  Patient states she has improved mood and "very, very, very low" SI.  Patient states that her main complaint now is that she has not had very good bowel movements and "green urine."  Discussed with patient that she will likely have good bowel movement with continued MiraLAX.  Patient states that she has also some abdominal pain near her bellybutton which is likely due to increased stool burden.  Encourage patient to take cranberry juice/prune juice as well as MiraLAX.  Patient verbalized understanding.  With regards to patient's abnormally colored urine, patient states that she has dysuria but only after emptying.  Discussed treatment with nitrofurantoin given report of dysuria and based on UA results and patient verbalized agreement for this.  Patient also is complaining of not sleeping well.  Patient states that she wakes up multiple times  throughout the night due to restlessness.  Discussed with patient that we will add trazodone in order to help with sleep and patient verbalized agreement.  Discussed with patient importance not to stand up too quickly as she is on a lot of medications that affect orthostatics.  Patient denies present HI/AVH.  Patient denies first rank symptoms, ideas of reference, thought broadcasting, paranoia, thought insertion/extraction.   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 ([place]) Active Problems:   Cocaine abuse ([place])   Alcohol use disorder, moderate, dependence ([place])   Cannabis dependence with current use ([place])   Vitamin D deficiency  Total Time spent with patient: 25 minutes  Past Psychiatric History: see H&P  Past Medical History:  Past Medical History:  Diagnosis Date   Allergy    seasonal   Anxiety    Arthritis    "left knee" (07/05/2016)   Asthma    Bipolar 1 disorder ([place])    Depression    Diabetes mellitus without complication (HCC)    pre-   GERD (gastroesophageal reflux disease)    Hypertension    MI (mitral incompetence)    Post traumatic stress disorder    Schizophrenia ([place])    Stomach ulcer    Substance abuse ([place])    cocaine quit july 2020   Suicide attempt [facility])     Past Surgical History:  Procedure Laterality Date   CARDIAC CATHETERIZATION  07/05/2016   CARDIAC CATHETERIZATION N/A 07/05/2016   Procedure: Left Heart Cath and Coronary Angiography;  Surgeon: Adrian Prows, MD;  Location: [place] CV LAB;  Service: Cardiovascular;  Laterality: N/A;   CYST EXCISION Right    "wrist"   DILATION AND CURETTAGE OF UTERUS     FOOT SURGERY Bilateral    "corns removed"   LEFT HEART CATH AND CORONARY ANGIOGRAPHY N/A 12/24/2017   Procedure: LEFT HEART CATH AND CORONARY ANGIOGRAPHY;  Surgeon: Nigel Mormon, MD;  Location: [place]  CV LAB;  Service: Cardiovascular;  Laterality: N/A;   TUBAL LIGATION     Family History:  Family History  Problem Relation Age of Onset   Breast cancer Other    Lung cancer Other    Congestive Heart Failure Other    Diabetes Other    Hypertension Other    Colon cancer Neg Hx    Colon polyps Neg Hx    Esophageal cancer Neg Hx    Stomach cancer Neg Hx    Rectal cancer Neg Hx    Family Psychiatric  History: seee H&P Social History:  Social History   Substance and Sexual Activity  Alcohol Use Yes   Alcohol/week: 7.0 standard drinks   Types: 7 Cans of beer per week     Social History   Substance and Sexual Activity  Drug Use Yes   Types: Marijuana    Social History   Socioeconomic History   Marital status: Widowed    Spouse name: Not on file   Number of children: 2   Years of education: Not on file   Highest education level: Not on file  Occupational History   Occupation: disabled   Tobacco Use   Smoking status: Some Days    Packs/day: 0.25    Years: 35.00    Pack years: 8.75    Types: Cigarettes    Last attempt to quit: 05/30/2019    Years since quitting: 2.2   Smokeless tobacco: Never  Vaping Use   Vaping Use: Never used  Substance and Sexual Activity   Alcohol use: Yes    Alcohol/week: 7.0 standard drinks    Types: 7 Cans of beer per week   Drug use: Yes    Types: Marijuana   Sexual activity: Yes    Birth control/protection: Surgical  Other Topics Concern   Not on file  Social History Narrative   Not on file   Social Determinants of Health   Financial Resource Strain: Not on file  Food Insecurity: Not on file  Transportation Needs: Not on file  Physical Activity: Not on file  Stress: Not on file  Social Connections: Not on file   Additional Social History:                         Sleep: Fair  Appetite:  Fair  Current Medications: Current Facility-Administered Medications  Medication Dose Route Frequency Provider Last Rate Last  Admin   acetaminophen (TYLENOL) tablet 650 mg  650 mg Oral Q6H PRN Ajibola, Ene A, NP   650 mg at 09/03/21 1853   albuterol (VENTOLIN HFA) 108 (90 Base) MCG/ACT inhaler 1-2 puff  1-2 puff Inhalation Q6H PRN France Ravens, MD   2 puff at 09/05/21 1945   alum & mag hydroxide-simeth (MAALOX/MYLANTA) 200-200-20 MG/5ML suspension 30 mL  30 mL Oral Q4H PRN Ajibola, Ene A, NP       amLODipine (NORVASC) tablet 10 mg  10 mg Oral Daily Nelda Marseille, Niala Stcharles E, MD   10 mg at 09/05/21 0827   B-complex with vitamin C tablet 1 tablet  1 tablet Oral Daily Hill,  Jackie Plum, MD   1 tablet at 09/05/21 8811   clotrimazole (GYNE-LOTRIMIN) vaginal cream 1 Applicatorful  1 Applicatorful Vaginal QHS Hill, Jackie Plum, MD   1 Applicatorful at 01/23/93 2134   hydrOXYzine (ATARAX/VISTARIL) tablet 25 mg  25 mg Oral TID PRN Ajibola, Ene A, NP   25 mg at 09/05/21 1449   lamoTRIgine (LAMICTAL) tablet 25 mg  25 mg Oral Daily France Ravens, MD   25 mg at 09/05/21 5859   melatonin tablet 5 mg  5 mg Oral QHS Hill, Jackie Plum, MD   5 mg at 09/05/21 2132   mirtazapine (REMERON) tablet 15 mg  15 mg Oral Aliene Altes, MD   15 mg at 09/05/21 2132   pantoprazole (PROTONIX) EC tablet 40 mg  40 mg Oral Daily Viann Fish E, MD   40 mg at 09/06/21 2924   polyethylene glycol (MIRALAX / GLYCOLAX) packet 17 g  17 g Oral Daily PRN France Ravens, MD   17 g at 09/05/21 4628   prazosin (MINIPRESS) capsule 1 mg  1 mg Oral QHS Nelda Marseille, Nehan Flaum E, MD   1 mg at 09/05/21 2132   risperiDONE (RISPERDAL) tablet 2 mg  2 mg Oral QHS Nelda Marseille, Trystyn Sitts E, MD   2 mg at 09/05/21 2132   simvastatin (ZOCOR) tablet 40 mg  40 mg Oral q1800 Harlow Asa, MD   40 mg at 09/05/21 1820   Vitamin D (Ergocalciferol) (DRISDOL) capsule 50,000 Units  50,000 Units Oral Q7 days Maida Sale, MD   50,000 Units at 09/04/21 6381    Lab Results:  Results for orders placed or performed during the hospital encounter of 09/02/21 (from the past 48 hour(s))  Pregnancy,  urine     Status: None   Collection Time: 09/04/21 10:55 AM  Result Value Ref Range   Preg Test, Ur NEGATIVE NEGATIVE    Comment:        THE SENSITIVITY OF THIS METHODOLOGY IS >20 mIU/mL. Performed at [facility], [place] [address] [place], [place] [address]   Ammonia     Status: None   Collection Time: 09/05/21  6:36 AM  Result Value Ref Range   Ammonia 31 9 - 35 umol/L    Comment: Performed at [facility], [place] [address] [place], [place] [address]  hCG, quantitative, pregnancy     Status: Abnormal   Collection Time: 09/05/21  6:36 AM  Result Value Ref Range   hCG, Beta Chain, Quant, S 6 (H) <5 mIU/mL    Comment:          GEST. AGE      CONC.  (mIU/mL)   <=1 WEEK        5 - 50     2 WEEKS       50 - 500     3 WEEKS       100 - 10,000     4 WEEKS     1,000 - 30,000     5 WEEKS     3,500 - 115,000   6-8 WEEKS     12,000 - 270,000    12 WEEKS     15,000 - 220,000        FEMALE AND NON-PREGNANT FEMALE:     LESS THAN 5 mIU/mL Performed at [facility], [place] [address] [place], [place] [address]   CBC with Differential/Platelet     Status: Abnormal   Collection Time: 09/05/21  6:36  AM  Result Value Ref Range   WBC 7.0 4.0 - 10.5 K/uL   RBC 4.33 3.87 - 5.11 MIL/uL   Hemoglobin 11.8 (L) 12.0 - 15.0 g/dL   HCT 37.7 36.0 - 46.0 %   MCV 87.1 80.0 - 100.0 fL   MCH 27.3 26.0 - 34.0 pg   MCHC 31.3 30.0 - 36.0 g/dL   RDW 17.8 (H) 11.5 - 15.5 %   Platelets 250 150 - 400 K/uL   nRBC 0.0 0.0 - 0.2 %   Neutrophils Relative % 51 %   Neutro Abs 3.6 1.7 - 7.7 K/uL   Lymphocytes Relative 37 %   Lymphs Abs 2.6 0.7 - 4.0 K/uL   Monocytes Relative 7 %   Monocytes Absolute 0.5 0.1 - 1.0 K/uL   Eosinophils Relative 4 %   Eosinophils Absolute 0.3 0.0 - 0.5 K/uL   Basophils Relative 1 %   Basophils Absolute 0.1 0.0 - 0.1 K/uL   Immature Granulocytes 0 %   Abs Immature Granulocytes 0.02 0.00 - 0.07 K/uL    Comment: Performed at [facility], [place] [address], [place], [place] [address]  Urinalysis, Complete w Microscopic Urine, Clean Catch     Status: Abnormal   Collection Time: 09/05/21 10:58 AM  Result Value Ref Range   Color, Urine YELLOW YELLOW   APPearance CLOUDY (A) CLEAR   Specific Gravity, Urine 1.004 (L) 1.005 - 1.030   pH 7.0 5.0 - 8.0   Glucose, UA NEGATIVE NEGATIVE mg/dL   Hgb urine dipstick SMALL (A) NEGATIVE   Bilirubin Urine NEGATIVE NEGATIVE   Ketones, ur NEGATIVE NEGATIVE mg/dL   Protein, ur NEGATIVE NEGATIVE mg/dL   Nitrite NEGATIVE NEGATIVE   Leukocytes,Ua TRACE (A) NEGATIVE   RBC / HPF 6-10 0 - 5 RBC/hpf   WBC, UA 0-5 0 - 5 WBC/hpf   Bacteria, UA RARE (A) NONE SEEN   Squamous Epithelial / LPF 11-20 0 - 5   Mucus PRESENT     Comment: Performed at [facility], [place] [address], [place], [place] [address]    Blood Alcohol level:  Lab Results  Component Value Date   ETH <10 09/02/2021   ETH 33 (H) 82/95/6213    Metabolic Disorder Labs: Lab Results  Component Value Date   HGBA1C 5.7 (H) 09/03/2021   MPG 116.89 09/03/2021   MPG 116.89 01/13/2020   No results found for: PROLACTIN Lab Results  Component Value Date   CHOL 230 (H) 09/03/2021   TRIG 111 09/03/2021   HDL 62 09/03/2021   CHOLHDL 3.7 09/03/2021   VLDL 22 09/03/2021   LDLCALC 146 (H) 09/03/2021   LDLCALC 114 (H) 01/13/2020    Physical Findings:   Musculoskeletal: Strength & Muscle Tone: within normal limits Gait & Station: normal Patient leans: N/A  Psychiatric Specialty Exam:  Presentation  General Appearance: Casual  Eye Contact:Good  Speech:Clear and Coherent  Speech Volume:Normal  Handedness:No data recorded  Mood and Affect  Mood:described as improving - appears more euthymic  Affect:mildly constricted   Thought Process  Thought Processes:Coherent; Goal Directed; Linear  Descriptions of Associations:Intact  Orientation:Full (Time, Place and  Person)  Thought Content:Denies paranoia, VH; denies AH, ideas of reference, first rank symptoms and is not grossly responding to internal/external stimuli on exam  History of Schizophrenia/Schizoaffective disorder:No  Duration of Psychotic Symptoms:No data recorded Hallucinations:Hallucinations: None  Ideas of Reference:None  Suicidal Thoughts:Passive but can contract for safety on the unit  Homicidal Thoughts:Homicidal Thoughts: No  Sensorium  Memory:Immediate Good; Recent Good; Remote Good  Judgment:Fair  Insight:Fair   Executive Functions  Concentration:Fair  Attention Span:Fair  [place]   Psychomotor Activity  Psychomotor Activity:Psychomotor Activity: Normal   Assets  Assets:Communication Skills; Desire for Improvement; Resilience   Sleep  Sleep:6.25 hours    Physical Exam Vitals and nursing note reviewed.  Constitutional:      Appearance: Normal appearance. She is normal weight.  HENT:     Head: Normocephalic and atraumatic.  Pulmonary:     Effort: Pulmonary effort is normal.  Neurological:     General: No focal deficit present.     Mental Status: She is oriented to person, place, and time.   Review of Systems  Respiratory:  Negative for shortness of breath.   Cardiovascular:  Negative for chest pain.  Gastrointestinal:  Negative for abdominal pain, constipation, diarrhea, heartburn, nausea and vomiting.  Neurological:  Negative for headaches.  Blood pressure 125/90, pulse 93, temperature 98.6 F (37 C), resp. rate 18, height 5' 8.5" (1.74 m), weight 88.5 kg, SpO2 100 %. Body mass index is 29.22 kg/m.   Treatment Plan Summary: Daily contact with patient to assess and evaluate symptoms and progress in treatment and Medication management  ASSESSMENT Patient is a 55 year old female with reported psychiatric history of depression, bipolar, PTSD admitted to [facility] from [facility] ED due to worsening  suicidal ideation and suicide attempt with Advil overdose and smoking crack.   Patient endorsing improved mood.  Patient endorsing mild SI but otherwise denies present HI/AVH.  Patient appears to be progressing well.  PLAN Psychiatric Problems Bipolar Disorder, Type II depressive episode severe with psychotic features (r/o MDD -severe, r/o substance induced mood d/o) PTSD by hx -Remeron 15 nightly for PTSD and sleep -Lamictal 25 mg daily for mood stability and bipolar depression titrating up over time. -Melatonin 5 mg, for sleep -Prazosin 1 mg qhs for nightmares -Risperdal 2 mg qhs for psychotic symptoms and mood stabilization  -Qtc (10/26) 452; A1c 5.7, Cholesterol 230 and LDL 146 -Trazodone 50 mg qhs for sleep.  R/B/SE discussed with patient patient agreed to medication trial.  Stimulant use d/o - cocaine type Alcohol use d/o Cannabis use d/o - Counseled on need to abstain from substances and she is interested in residential rehab after discharge - SW exploring rehab options  Medical Problems  Elevated TSH, likely 2/2 stress response (r/o subclinical hypothyroidism) Free T4 0.7, Free T3 2.2 both WNL - Will need repeat TSH in 4-6 weeks  Vitamin D Deficiency -Cholecalciferol 50000 units every week  Hyperammonemia (RESOLVED) and constipation Discontinued Lactulose 10 g bid Ammonia 38 (10/24) -> 31 (10/26), normalized Miralax for contipation and MOM PRN  Leukocytosis, RESOLVED - WBC downtrending to 7.0  Normocytic Anemia Hgb 11.8 -Will continue to monitor  Elevated HCG -Negative UPT Quantitative hCG 6, questionably post-menopausal related - will need f/u with GYN after discharge for w/u which was discussed with patient in detail  Abnormal UA (cloudy, 1.004, small hgb, negative protein, trace leucocyte, rare bacteria) -Urine Culture reflex pending - Due to dysuria and UA findings will start Macrobid 118m bid for 5 days  Chest Pain with Prolonged QTC prior to admission -  resolved -Likely musculoskeletal in nature -Most recent QTC 452 -will continue to monitor   Hypercholesteremia - Cholesterol 230 and LDL 146  - continue home zocor 443mdaily   Asthma -Albuterol inhaler prn for wheezing/shortness of breath - CXR negative   HTN -  continue Norvasc 44m daily   Vertigo -We will continue to monitor -Recommend PCP follow-up to monitor vertigo  Refractory Reported Yeast Infection Clotrimazole cream qhs x3days  PRNs Tylenol 650 mg for mild pain Maalox/Mylanta 30 mL for indigestion Bendaryl q6hr for itching/allergies Hydroxyzine 25 mg tid for anxiety Milk of Magnesia 30 mL for constipation   3. Safety and Monitoring: Voluntary admission to inpatient psychiatric unit for safety, stabilization and treatment Daily contact with patient to assess and evaluate symptoms and progress in treatment Patient's case to be discussed in multi-disciplinary team meeting Observation Level : q15 minute checks Vital signs: q12 hours Precautions: suicide, elopement, and assault   4. Discharge Planning: Social work and case management to assist with discharge planning and identification of hospital follow-up needs prior to discharge Estimated LOS: 5-7 days Discharge Concerns: Need to establish a safety plan; Medication compliance and effectiveness Discharge Goals: Return home with outpatient referrals for mental health follow-up including medication management/psychotherapy  AFrance Ravens MD 09/06/2021, 6:52 AM

## 2021-09-06 NOTE — Progress Notes (Signed)
[facility] Group Notes:  (Nursing/MHT/Case Management/Adjunct)  Date:  09/06/2021  Time:  2015  Type of Therapy:   wrap up group  Participation Level:  Active  Participation Quality:  Appropriate, Attentive, Sharing, and Supportive  Affect:  Appropriate  Cognitive:  Alert  Insight:  Improving  Engagement in Group:  Engaged  Modes of Intervention:  Clarification, Education, and Support  Summary of Progress/Problems: Positive thinking and positive change were discussed.   Shellia Cleverly 09/06/2021, 9:47 PM

## 2021-09-06 NOTE — Plan of Care (Signed)
Has been appropriate on the unit with no sign of distress. Complained of constipation which was relieved by Miralax + prune juice. Denying thoughts of self harm. Mood improving.

## 2021-09-06 NOTE — Progress Notes (Signed)
Pt visible on the unit much of the evening. Pt stated she was feeling a little better, but still passive SI-contracts for safety    09/06/21 0100  Psych Admission Type (Psych Patients Only)  Admission Status Voluntary  Psychosocial Assessment  Patient Complaints Anxiety  Eye Contact Fair  Facial Expression Blank  Affect Flat  Speech Logical/coherent  Interaction Assertive  Motor Activity Other (Comment) (WNL)  Appearance/Hygiene Unremarkable  Behavior Characteristics Cooperative  Mood Depressed  Thought Process  Coherency Circumstantial  Content Blaming self  Delusions Other (Comment)  Perception WDL  Hallucination None reported or observed  Judgment Poor  Confusion WDL  Danger to Self  Current suicidal ideation? Passive  Self-Injurious Behavior No self-injurious ideation or behavior indicators observed or expressed   Agreement Not to Harm Self Yes  Description of Agreement Verbal  Danger to Others  Danger to Others None reported or observed

## 2021-09-06 NOTE — BHH Suicide Risk Assessment (Signed)
Denise Knight INPATIENT:  Family/Significant Other Suicide Prevention Education  Suicide Prevention Education:  Education Completed; Denise Knight [phone] (Daughter) has been identified by the patient as the family member/significant other with whom the patient will be residing, and identified as the person(s) who will aid the patient in the event of a mental health crisis (suicidal ideations/suicide attempt).  With written consent from the patient, the family member/significant other has been provided the following suicide prevention education, prior to the and/or following the discharge of the patient.  The suicide prevention education provided includes the following: Suicide risk factors Suicide prevention and interventions National Suicide Hotline telephone number Denise Knight assessment telephone number Denise Knight Emergency Assistance Denise Knight and/or Residential Mobile Crisis Unit telephone number  Request made of family/significant other to: Remove weapons (e.g., guns, rifles, knives), all items previously/currently identified as safety concern.   Remove drugs/medications (over-the-counter, prescriptions, illicit drugs), all items previously/currently identified as a safety concern.  The family member/significant other verbalizes understanding of the suicide prevention education information provided.  The family member/significant other agrees to remove the items of safety concern listed above.  CSW spoke with [name] who states that her mother has been experiencing issues with depression, suicidal thoughts, and substance use for a few years.  She states that her mother was in a residential treatment facility for substance use last year in 2021 Denise Knight).  She states that her mother only has [place] benefits and hopes that her mother can get into a treatment center through the [place].  [name] states that she has no questions or concerns at this  time and does not believe that her mother has access to any weapons or firearms outside the Knight.  CSW completed SPE with [name].    Denise Knight Denise Knight 09/06/2021, 2:59 PM

## 2021-09-06 NOTE — BHH Counselor (Signed)
CSW spoke with the Pt who states that she is open to [facility] in [place].  CSW contacted [facility] in [place] which does accept [place] Benefits.  CSW was informed by [facility] that they do accept [place] but not [place] benefits.  [facility] advised that the Pt would likely have to go through the [place] to seek inpatient substance use treatment.

## 2021-09-06 NOTE — BHH Counselor (Signed)
CSW contacted [facility] in [place] seeking inpatient residential treatment for the Pt.  CSW was informed that there are currently no beds available at this time and that the Pt would need to call back each Tuesday to check on bed availability.  The Pt was scheduled for a phone intake on 09/11/2021 at 1:00pm with Beth at phone number [phone] ext [phone].  CSW will provide the Pt with this phone number to complete this phone call.

## 2021-09-07 ENCOUNTER — Encounter (HOSPITAL_COMMUNITY): Payer: Self-pay

## 2021-09-07 DIAGNOSIS — F3181 Bipolar II disorder: Secondary | ICD-10-CM | POA: Diagnosis not present

## 2021-09-07 MED ORDER — MELATONIN 5 MG PO TABS
10.0000 mg | ORAL_TABLET | Freq: Every day | ORAL | Status: DC
Start: 2021-09-07 — End: 2021-09-11
  Administered 2021-09-07 – 2021-09-10 (×4): 10 mg via ORAL
  Filled 2021-09-07 (×5): qty 2

## 2021-09-07 MED ORDER — PRAZOSIN HCL 2 MG PO CAPS
2.0000 mg | ORAL_CAPSULE | Freq: Every day | ORAL | Status: DC
  Administered 2021-09-07: 2 mg via ORAL
  Filled 2021-09-07 (×3): qty 1

## 2021-09-07 NOTE — BHH Group Notes (Signed)
The focus of this group is to help patients establish daily goals to achieve during treatment and discuss how the patient can incorporate goal setting into their daily lives to aide in recovery.  Pt attended morning goals group and stated that her goal for today was to find a rehab place.So she can seek extra treatment.

## 2021-09-07 NOTE — Progress Notes (Addendum)
[facility] MD Progress Note  09/07/2021 3:48 PM Jeriann Sayres  MRN:  [id number] Subjective:  Patient is a 55 year old female with reported psychiatric history of depression, bipolar, PTSD admitted to [facility] from [facility] ED due to worsening suicidal ideation and suicide attempt with Advil overdose.  Chart Review, 24 hr Events: The patient's chart was reviewed and nursing notes were reviewed. The patient's case was discussed in multidisciplinary team meeting.  Per MAR: - Patient is compliant with scheduled meds. - PRNs: albuterol, hydroxyzine x2, lactulose Per RN notes, no documented behavioral issues and is attending group. Patient slept, 5.75 hours  Patient had the following psychiatric recommendations yesterday: -Remeron 15 nightly for PTSD, sleep and depression.   -Continue Lamictal 25 mg daily for mood stability and bipolar disorder titrating up over time.  -Continue Melatonin 5 mg, for sleep - Start Trazodone 67m qhs for sleep - Continue Prazosin 125mqhs for nightmares -Start Risperdal 2 mg qhs for psychotic symptoms.  TODAY'S INTERVIEW Patient seen and assessed with attending Dr. SiNelda Marseille Patient states that her mood has greatly improved since yesterday.  Patient states that she finally had a bowel movement and has denied any abdominal pain at this time.  Patient states that she plans to continue prune juice daily po regimen to ensure she will have regular bowel movements going forward.  Patient denies present SI/HI/AVH.  Patient denies any physical complaints at this time.  Patient's main concern is that she still is not sleeping well and she states she had 3 different nightmares last night which led to her staying up for some time.  Patient states that she would like to increase her prazosin and discontinue trazodone in order to reduce possible vivid dream/nightmares from occurring.  Patient also requesting increase in melatonin if possible in order to help with her sleep.   Patient states that she would like to have a bed to bed transfer in order to continue her rehabilitation.  Patient states that her main concern is her substance use since she feels that if she is discharged home, she will relapse and smoke crack.  Patient appeared tearful when making this statement.  Patient states she has been eating appropriately and hydrating appropriately.  Patient denies present SI/HI/AVH.  Patient denies first rank symptoms, ideas of reference, thought broadcasting.  Patient specifically denies seeing people by the window when she is sleeping or strangers in the group room that others cannot see.  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 (HCCrystal MountainActive Problems:   Cocaine abuse (HCBarton  Alcohol use disorder, moderate, dependence (HCNormandy Park  Cannabis dependence with current use (HCLane  Vitamin D deficiency  Total Time spent with patient: 25 minutes  Past Psychiatric History: see H&P  Past Medical History:  Past Medical History:  Diagnosis Date   Allergy    seasonal   Anxiety    Arthritis    "left knee" (07/05/2016)   Asthma    Bipolar 1 disorder (HCMiddleburg Heights   Depression    Diabetes mellitus without complication (HCC)    pre-   GERD (gastroesophageal reflux disease)    Hypertension    MI (mitral incompetence)    Post traumatic stress disorder    Schizophrenia (HCWarwick   Stomach ulcer    Substance abuse (HCSturgis   cocaine quit july 2020   Suicide attempt (HCBuck Grove  Past Surgical History:  Procedure Laterality Date   CARDIAC CATHETERIZATION  07/05/2016   CARDIAC CATHETERIZATION N/A 07/05/2016   Procedure: Left Heart Cath and Coronary Angiography;  Surgeon: Adrian Prows, MD;  Location: [place] [facility];  Service: Cardiovascular;  Laterality: N/A;   CYST EXCISION Right    "wrist"   DILATION AND CURETTAGE OF UTERUS     FOOT SURGERY  Bilateral    "corns removed"   LEFT HEART CATH AND CORONARY ANGIOGRAPHY N/A 12/24/2017   Procedure: LEFT HEART CATH AND CORONARY ANGIOGRAPHY;  Surgeon: Nigel Mormon, MD;  Location: [place] [facility];  Service: Cardiovascular;  Laterality: N/A;   TUBAL LIGATION     Family History:  Family History  Problem Relation Age of Onset   Breast cancer Other    Lung cancer Other    Congestive Heart Failure Other    Diabetes Other    Hypertension Other    Colon cancer Neg Hx    Colon polyps Neg Hx    Esophageal cancer Neg Hx    Stomach cancer Neg Hx    Rectal cancer Neg Hx    Family Psychiatric  History: seee H&P Social History:  Social History   Substance and Sexual Activity  Alcohol Use Yes   Alcohol/week: 7.0 standard drinks   Types: 7 Cans of beer per week     Social History   Substance and Sexual Activity  Drug Use Yes   Types: Marijuana    Social History   Socioeconomic History   Marital status: Widowed    Spouse name: Not on file   Number of children: 2   Years of education: Not on file   Highest education level: Not on file  Occupational History   Occupation: disabled   Tobacco Use   Smoking status: Some Days    Packs/day: 0.25    Years: 35.00    Pack years: 8.75    Types: Cigarettes    Last attempt to quit: 05/30/2019    Years since quitting: 2.2   Smokeless tobacco: Never  Vaping Use   Vaping Use: Never used  Substance and Sexual Activity   Alcohol use: Yes    Alcohol/week: 7.0 standard drinks    Types: 7 Cans of beer per week   Drug use: Yes    Types: Marijuana   Sexual activity: Yes    Birth control/protection: Surgical  Other Topics Concern   Not on file  Social History Narrative   Not on file   Social Determinants of Health   Financial Resource Strain: Not on file  Food Insecurity: Not on file  Transportation Needs: Not on file  Physical Activity: Not on file  Stress: Not on file  Social Connections: Not on file   Sleep:  Poor  Appetite:  Fair  Current Medications: Current Facility-Administered Medications  Medication Dose Route Frequency Provider Last Rate Last Admin   acetaminophen (TYLENOL) tablet 650 mg  650 mg Oral Q6H PRN Ajibola, Ene A, NP   650 mg at 09/03/21 1853   albuterol (VENTOLIN HFA) 108 (90 Base) MCG/ACT inhaler 1-2 puff  1-2 puff Inhalation Q6H PRN France Ravens, MD   2 puff at 09/07/21 0826   alum & mag hydroxide-simeth (MAALOX/MYLANTA) 200-200-20 MG/5ML suspension 30 mL  30 mL Oral Q4H PRN Ajibola, Ene A, NP       amLODipine (NORVASC) tablet 10 mg  10 mg Oral Daily Harlow Asa, MD   10 mg at 09/07/21 [phone]  B-complex with vitamin C tablet 1 tablet  1 tablet Oral Daily Hill, Jackie Plum, MD   1 tablet at 09/07/21 0825   clotrimazole (GYNE-LOTRIMIN) vaginal cream 1 Applicatorful  1 Applicatorful Vaginal QHS Hill, Jackie Plum, MD   1 Applicatorful at 33/54/56 2124   hydrOXYzine (ATARAX/VISTARIL) tablet 25 mg  25 mg Oral TID PRN Ajibola, Ene A, NP   25 mg at 09/06/21 0911   lamoTRIgine (LAMICTAL) tablet 25 mg  25 mg Oral Daily France Ravens, MD   25 mg at 09/07/21 2563   magnesium hydroxide (MILK OF MAGNESIA) suspension 15 mL  15 mL Oral Daily PRN France Ravens, MD       melatonin tablet 10 mg  10 mg Oral QHS Nelda Marseille, Zahir Eisenhour E, MD       mirtazapine (REMERON) tablet 15 mg  15 mg Oral Aliene Altes, MD   15 mg at 09/06/21 2120   nitrofurantoin (macrocrystal-monohydrate) (MACROBID) capsule 100 mg  100 mg Oral Q12H [place] Lapaglia E, MD   100 mg at 09/07/21 0825   pantoprazole (PROTONIX) EC tablet 40 mg  40 mg Oral Daily Nelda Marseille, Detrich Rakestraw E, MD   40 mg at 09/07/21 0626   polyethylene glycol (MIRALAX / GLYCOLAX) packet 17 g  17 g Oral Daily PRN France Ravens, MD   17 g at 09/06/21 1542   prazosin (MINIPRESS) capsule 2 mg  2 mg Oral QHS France Ravens, MD       risperiDONE (RISPERDAL) tablet 2 mg  2 mg Oral QHS Nelda Marseille, Rheanna Sergent E, MD   2 mg at 09/06/21 2121   simvastatin (ZOCOR) tablet 40 mg  40 mg Oral q1800  Harlow Asa, MD   40 mg at 09/06/21 1736   Vitamin D (Ergocalciferol) (DRISDOL) capsule 50,000 Units  50,000 Units Oral Q7 days Maida Sale, MD   50,000 Units at 09/04/21 8937    Lab Results:  No results found for this or any previous visit (from the past 48 hour(s)).   Blood Alcohol level:  Lab Results  Component Value Date   ETH <10 09/02/2021   ETH 33 (H) 34/28/7681    Metabolic Disorder Labs: Lab Results  Component Value Date   HGBA1C 5.7 (H) 09/03/2021   MPG 116.89 09/03/2021   MPG 116.89 01/13/2020   No results found for: PROLACTIN Lab Results  Component Value Date   CHOL 230 (H) 09/03/2021   TRIG 111 09/03/2021   HDL 62 09/03/2021   CHOLHDL 3.7 09/03/2021   VLDL 22 09/03/2021   LDLCALC 146 (H) 09/03/2021   LDLCALC 114 (H) 01/13/2020    Physical Findings:   Musculoskeletal: Strength & Muscle Tone: within normal limits Gait & Station: normal Patient leans: N/A  Psychiatric Specialty Exam:  Presentation  General Appearance: Casually dressed, adequate hygiene  Eye Contact:Good  Speech:Clear and Coherent  Speech Volume:Normal , regular rate  Handedness:No data recorded  Mood and Affect  Mood: described as improving - appears mildly anxious when discussing discharge options  Affect:teaful at times when discussing risk of relapse; anxious   Thought Process  Thought Processes:Coherent; Goal Directed; Linear  Descriptions of Associations:Intact  Orientation:Full (Time, Place and Person)  Thought Content:Denies AVH, ideas of reference, first rank symptoms or ideas of reference and is not grossly responding to internal/external stimuli on exam  History of Schizophrenia/Schizoaffective disorder:No  Duration of Psychotic Symptoms:No data recorded Hallucinations:Denied today  Ideas of Reference:None  Suicidal Thoughts:Denied   Homicidal Thoughts:Denied   Sensorium  Memory:Immediate Good; Recent  Good; Remote  Good  Judgment:Fair  Insight:Fair   Executive Functions  Concentration:Fair  Attention Span:Fair  [place]   Psychomotor Activity  Psychomotor Activity:normal   Assets  Assets:Communication Skills; Desire for Improvement; Resilience   Sleep  Sleep:5.75 hours    Physical Exam Vitals and nursing note reviewed.  Constitutional:      Appearance: Normal appearance. She is normal weight.  HENT:     Head: Normocephalic and atraumatic.  Pulmonary:     Effort: Pulmonary effort is normal.  Neurological:     General: No focal deficit present.     Mental Status: She is oriented to person, place, and time.   Review of Systems  Respiratory:  Negative for shortness of breath.   Cardiovascular:  Negative for chest pain.  Gastrointestinal:  Negative for abdominal pain, constipation, diarrhea, heartburn, nausea and vomiting.  Neurological:  Negative for headaches.  Blood pressure 116/86, pulse 92, temperature 97.8 F (36.6 C), temperature source Oral, resp. rate 16, height 5' 8.5" (1.74 m), weight 88.5 kg, SpO2 100 %. Body mass index is 29.22 kg/m.   Treatment Plan Summary: Daily contact with patient to assess and evaluate symptoms and progress in treatment and Medication management  ASSESSMENT Patient is a 55 year old female with reported psychiatric history of depression, bipolar, PTSD admitted to [facility] from [facility] ED due to worsening suicidal ideation and suicide attempt with Advil overdose and smoking crack.    Patient endorsing improved mood and denies SI or signs of psychosis. Patient endorsing nightmares past few nights which is likely contributing to her insomnia.  Given patient's persistent nightmares as well as insomnia, plan for increase in prazosin and removal of trazodone in order to treat vivid nightmares.  Patient also requested increase in melatonin.In addition, social work is assisting with finding patient placement  for substance rehab.    PLAN Psychiatric Problems Bipolar Disorder, Type II depressive episode severe with psychotic features (r/o MDD -severe, r/o substance induced mood d/o) PTSD by hx -Continue Remeron 15 nightly for PTSD, depression, and sleep -Continue Lamictal 25 mg daily for mood stability and bipolar disorder titrating up over time. -Increase Melatonin 10 mg, for sleep - Discontinue Trazodone - Increase Prazosin to 1m qhs for nightmares - monitoring BP with med increase and patient counseled on risk of orthostatic BP changes with dose increase -Continue Risperdal to 2 mg qhs for psychotic symptoms and mood stabilization.    -Qtc (10/26) 452; A1c 5.7, Cholesterol 230 and LDL 146  Stimulant use d/o - cocaine type Alcohol use d/o Cannabis use d/o - Counseled on need to abstain from substances and she is interested in residential rehab after discharge - No acute signs of withdrawal at this time  Medical Problems  Elevated TSH, likely 2/2 stress response (r/o subclinical hypothyroidism) Free T4 0.7, Free T3 2.2 both WNL - Will need repeat TSH in 4-6 weeks  Vitamin D Deficiency -Cholecalciferol 50000 units every week  Hyperammonemia (RESOLVED) and constipation (RESOLVED) Discontinued Lactulose Ammonia 38 (10/24) -> 31 (10/26), normalized Miralax for contipation PRN and patient drinking prune juice bid  Leukocytosis, RESOLVED - WBC downtrending to 7.0  Normocytic Anemia Hgb 11.8 -Will continue to monitor  Elevated HCG -Negative UPT Quantitative hCG 6, likely menopausal changes- will need GYN f/u after discharge  Abnormal UA (cloudy, 1.004, small hgb, negative protein, trace leucocyte, rare bacteria) -Urine Culture reflex pending - Continue Macrobid 1059mbid for 5 days  Chest Pain with Prolonged QTC prior to  admission -Likely musculoskeletal in nature -Most recent QTC 452 -will continue to monitor   Hypercholesteremia - Cholesterol 230 and LDL 146  - Continue  Zocor 28m daily   Asthma -Albuterol inhaler prn for wheezing/shortness of breath - CXR negative   HTN - Continue Norvasc 165mdaily   Vertigo -We will continue to monitor -Recommend PCP follow-up to monitor vertigo  Refractory Reported Yeast Infection Clotrimazole cream qhs x3days  PRNs Tylenol 650 mg for mild pain Maalox/Mylanta 30 mL for indigestion Bendaryl q6hr for itching/allergies Hydroxyzine 25 mg tid for anxiety Milk of Magnesia 30 mL for constipation   3. Safety and Monitoring: Voluntary admission to inpatient psychiatric unit for safety, stabilization and treatment Daily contact with patient to assess and evaluate symptoms and progress in treatment Patient's case to be discussed in multi-disciplinary team meeting Observation Level : q15 minute checks Vital signs: q12 hours Precautions: suicide, elopement, and assault   4. Discharge Planning: Social work and case management to assist with discharge planning and identification of hospital follow-up needs prior to discharge Estimated LOS: 7-10 days Discharge Concerns: Need to establish a safety plan; Medication compliance and effectiveness Discharge Goals: Return home with outpatient referrals for mental health follow-up including medication management/psychotherapy  AnFrance RavensMD 09/07/2021, 3:48 PM

## 2021-09-07 NOTE — BHH Counselor (Signed)
CSW attempted to contact the Pt's insurance company to check on possible residential treatment facilities that the insurance covers.  CSW  called [phone] and was told that they would ned to contact the pre-authorization department at [phone].  CSW attempted to contact that department and was transferred to the "provider side".  CSW then waited on hold for approximately 25 minutes before someone answer and promptly hung up the phone.  CSW wrote down the number to the pre-authorization department and provided the Pt with the phone number so she could contact her own insurance company at her earliest convince.  CSW also attempted to use [employer] to research options from [place] and learned that [place] does not pay for Inpatient Residential Substance Use Treatment outside of the [place] in most cases and is mostly used for outpatient resources and not inpatient resources unless it is a hospital.  CSW has provided the Pt with a few options that may accept her insurance.  Csw will attempt to resend the Pt's paperwork to [facility] on Monday if she is no longer stating that she is feeling suicidal in hopes that the treatment center will accept her since her mental health will be more stable and controlled.  The Pt also has a phone interview with [facility] in [place] on 09/11/2021.  CSW is also looking into SAIOP or CDIOP option for outpatient substance use treatment.

## 2021-09-07 NOTE — Plan of Care (Signed)
  Problem: Coping: Goal: Coping ability will improve Outcome: Progressing   Problem: Education: Goal: Ability to state activities that reduce stress will improve Outcome: Progressing   Problem: Coping: Goal: Ability to identify and develop effective coping behavior will improve Outcome: Progressing

## 2021-09-07 NOTE — Group Note (Addendum)
LCSW Group Therapy Note   Group Date: 09/07/2021 Start Time: 1300 End Time: 1400   Type of Therapy and Topic:  Group Therapy:   Participation Level:  Active  Topic: Coping Skills  Due to the acuity and complex discharge plans, group was not held. Patient was provided therapeutic worksheets and asked to meet with CSW as needed.  Mliss Fritz, [place] 09/07/2021  2:51 PM

## 2021-09-07 NOTE — BHH Group Notes (Signed)
Patient was engaged in Psycho-ed  group as evidenced by participating in music program where she sang her goals out. Educated patient on self-love and self-care.

## 2021-09-07 NOTE — Progress Notes (Signed)
  Pt reports, "high anxiety and depression earlier."  Per pt, "she took Vistaril and her peers encouraged her and now she feels better."  Pt denies SI/HI, and verbally contracts for safety.  Pt denies AVH.  Medication administered.  Pt is safe on unit with Q 15 minute safety checks.    09/07/21 2030  Psych Admission Type (Psych Patients Only)  Admission Status Voluntary  Psychosocial Assessment  Patient Complaints Anxiety;Depression  Eye Contact Fair  Facial Expression Anxious  Affect Appropriate to circumstance  Speech Logical/coherent  Interaction Assertive  Motor Activity Other (Comment) (wnl)  Appearance/Hygiene Unremarkable  Behavior Characteristics Cooperative  Mood Pleasant  Thought Process  Coherency WDL  Content WDL  Delusions None reported or observed  Perception WDL  Hallucination None reported or observed  Judgment WDL  Confusion WDL  Danger to Self  Current suicidal ideation? Denies  Self-Injurious Behavior No self-injurious ideation or behavior indicators observed or expressed   Agreement Not to Harm Self Yes  Description of Agreement verbal  Danger to Others  Danger to Others None reported or observed

## 2021-09-07 NOTE — BH IP Treatment Plan (Signed)
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) Active Problems:   Cocaine abuse ([place])   Alcohol use disorder, moderate, dependence ([place])   Cannabis dependence with current use ([place])   Vitamin D deficiency   Current Medications:  Current Facility-Administered Medications  Medication Dose Route Frequency Provider Last Rate Last Admin   acetaminophen (TYLENOL) tablet 650 mg  650 mg Oral Q6H PRN Ajibola, Ene A, NP   650 mg at 09/03/21 1853   albuterol (VENTOLIN HFA) 108 (90 Base) MCG/ACT inhaler 1-2 puff  1-2 puff Inhalation Q6H PRN France Ravens, MD   2 puff at 09/07/21 0826   alum & mag hydroxide-simeth (MAALOX/MYLANTA) 200-200-20 MG/5ML suspension 30 mL  30 mL Oral Q4H PRN Ajibola, Ene A, NP       amLODipine (NORVASC) tablet 10 mg  10 mg Oral Daily Nelda Marseille, Amy E, MD   10 mg at 09/07/21 8250   B-complex with vitamin C tablet 1 tablet  1 tablet Oral Daily Maida Sale, MD   1 tablet at 09/07/21 0825   clotrimazole (GYNE-LOTRIMIN) vaginal cream 1 Applicatorful  1 Applicatorful Vaginal QHS Hill, Jackie Plum, MD   1 Applicatorful at 03/70/48 2124   hydrOXYzine (ATARAX/VISTARIL) tablet 25 mg  25 mg Oral TID PRN Ajibola, Ene A, NP   25 mg at 09/06/21 0911   lamoTRIgine (LAMICTAL) tablet 25 mg  25 mg Oral Daily France Ravens, MD   25 mg at 09/07/21 8891   magnesium hydroxide (MILK OF MAGNESIA) suspension 15 mL  15 mL Oral Daily PRN France Ravens, MD       melatonin tablet 5 mg  5 mg Oral QHS Hill, Jackie Plum, MD   5 mg at 09/06/21 2121   mirtazapine (REMERON) tablet 15 mg  15 mg Oral Aliene Altes, MD   15 mg at 09/06/21 2120   nitrofurantoin (macrocrystal-monohydrate) (MACROBID) capsule 100 mg  100 mg Oral Q12H  Singleton, Amy E, MD   100 mg at 09/07/21 0825   pantoprazole (PROTONIX) EC tablet 40 mg  40 mg Oral Daily Nelda Marseille, Amy E, MD   40 mg at 09/07/21 0626   polyethylene glycol (MIRALAX / GLYCOLAX) packet 17 g  17 g Oral Daily PRN France Ravens, MD   17 g at 09/06/21 1542   prazosin (MINIPRESS) capsule 2 mg  2 mg Oral QHS France Ravens, MD       risperiDONE (RISPERDAL) tablet 2 mg  2 mg Oral QHS Nelda Marseille, Amy E, MD   2 mg at 09/06/21 2121   simvastatin (ZOCOR) tablet 40 mg  40 mg Oral q1800 Harlow Asa, MD   40 mg at 09/06/21 1736   traZODone (DESYREL) tablet 50 mg  50 mg Oral Aliene Altes, MD   50 mg at 09/06/21 2121   Vitamin D (Ergocalciferol) (DRISDOL) capsule 50,000 Units  50,000 Units Oral Q7 days Maida Sale, MD   50,000 Units at 09/04/21 6945   PTA Medications: Medications Prior to Admission  Medication Sig Dispense Refill Last Dose   meclizine (ANTIVERT) 25 MG tablet Take 25 mg by mouth 3 (three) times daily as needed for dizziness.      albuterol (VENTOLIN HFA) 108 (90 Base) MCG/ACT inhaler Inhale 1-2 puffs into the lungs every 6 (six) hours  as needed for wheezing or shortness of breath. 1 each 0    amLODipine (NORVASC) 10 MG tablet Take 10 mg by mouth daily.      cetirizine (ZYRTEC) 10 MG tablet Take 1 tablet (10 mg total) by mouth daily. 30 tablet 0    simvastatin (ZOCOR) 40 MG tablet Take 40 mg by mouth daily.       Patient Stressors: Financial difficulties   Health problems   Marital or family conflict    Patient Strengths:    Treatment Modalities: Medication Management, Group therapy, Case management,  1 to 1 session with clinician, Psychoeducation, Recreational therapy.   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 (Denise Knight) Long Term Goal(s): Improvement in symptoms so as ready for discharge   Short Term Goals: Ability to identify changes in lifestyle to reduce recurrence of condition will  improve Ability to verbalize feelings will improve Ability to disclose and discuss suicidal ideas Ability to demonstrate self-control will improve Ability to identify and develop effective coping behaviors will improve Ability to maintain clinical measurements within normal limits will improve Compliance with prescribed medications will improve Ability to identify triggers associated with substance abuse/mental health issues will improve  Medication Management: Evaluate patient's response, side effects, and tolerance of medication regimen.  Therapeutic Interventions: 1 to 1 sessions, Unit Group sessions and Medication administration.  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 (Denise Knight) Active Problems:   Cocaine abuse ([facility])   Alcohol use disorder, moderate, dependence ([place])   Cannabis dependence with current use (Denise Knight)   Vitamin D deficiency  Long Term Goal(s): Improvement in symptoms so as ready for discharge   Short Term Goals: Ability to identify changes in lifestyle to reduce recurrence of condition will improve Ability to verbalize feelings will improve Ability to disclose and discuss suicidal ideas Ability to demonstrate self-control will improve Ability to identify and develop effective coping behaviors will improve Ability to maintain clinical measurements within normal limits will improve Compliance with prescribed medications will improve Ability to identify triggers associated with substance abuse/mental health issues will improve     Medication Management: Evaluate patient's response, side effects, and tolerance of medication regimen.  Therapeutic Interventions: 1 to 1 sessions, Unit Group sessions and Medication administration.  Evaluation of Outcomes: Progressing   RN Treatment Plan for Primary Diagnosis: Severe depressed bipolar II disorder  with psychotic features ([name]) Long Term Goal(s): Knowledge of disease and therapeutic regimen to maintain health will improve  Short Term Goals: Ability to participate in decision making will improve, Ability to verbalize feelings will improve, and Ability to identify and develop effective coping behaviors will improve  Medication Management: RN will administer medications as ordered by provider, will assess and evaluate patient's response and provide education to patient for prescribed medication. RN will report any adverse and/or side effects to prescribing provider.  Therapeutic Interventions: 1 on 1 counseling sessions, Psychoeducation, Medication administration, Evaluate responses to treatment, Monitor vital signs and CBGs as ordered, Perform/monitor CIWA, COWS, AIMS and Fall Risk screenings as ordered, Perform wound care treatments as ordered.  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  Short Term Goals: Engage patient in aftercare planning with referrals  and resources, Increase social support, and Increase ability to appropriately verbalize feelings  Therapeutic Interventions: Assess for all discharge needs, 1 to 1 time with Education officer, museum, Explore available resources and support systems, Assess for adequacy in community support network, Educate family and significant other(s) on suicide prevention, Complete Psychosocial Assessment, Interpersonal group therapy.  Evaluation of Outcomes: Progressing   Progress in Treatment: Attending groups: Yes. and No. Participating in groups: No. Taking medication as prescribed: Yes. Toleration medication: Yes. Family/Significant other contact made: Yes, individual(s) contacted:   daughter Patient understands diagnosis: Yes. Discussing patient identified problems/goals with staff: Yes. Medical problems stabilized or resolved: Yes. Denies suicidal/homicidal ideation: Yes. Issues/concerns per patient self-inventory: No. Other: None  New problem(s) identified: No, Describe:  None  New Short Term/Long Term Goal(s):medication stabilization, elimination of SI thoughts, development of comprehensive mental wellness plan.   Patient Goals:  "to get back on my medicine, get rid of suicidal ideation, and get into rehab."  Discharge Plan or Barriers: Residential treatment options are continued to be explored for pt by CSW. Pt's rare VA insurance is currently a barrier and is rarely accepted at treatment facilities.   Reason for Continuation of Hospitalization: Medication stabilization  Estimated Length of Stay: 3-5 days   Scribe for Treatment Team: Eliott Nine 09/07/2021 10:58 AM

## 2021-09-08 DIAGNOSIS — F3181 Bipolar II disorder: Secondary | ICD-10-CM | POA: Diagnosis not present

## 2021-09-08 MED ORDER — LAMOTRIGINE 25 MG PO TABS
25.0000 mg | ORAL_TABLET | Freq: Every day | ORAL | Status: DC
  Administered 2021-09-09 – 2021-09-13 (×5): 25 mg via ORAL
  Filled 2021-09-08 (×7): qty 1

## 2021-09-08 MED ORDER — PRAZOSIN HCL 2 MG PO CAPS
3.0000 mg | ORAL_CAPSULE | Freq: Every day | ORAL | Status: DC
  Administered 2021-09-08 – 2021-09-09 (×2): 3 mg via ORAL
  Filled 2021-09-08 (×4): qty 1

## 2021-09-08 MED ORDER — PANTOPRAZOLE SODIUM 40 MG PO TBEC
40.0000 mg | DELAYED_RELEASE_TABLET | Freq: Every day | ORAL | Status: DC
  Administered 2021-09-08 – 2021-09-13 (×6): 40 mg via ORAL
  Filled 2021-09-08 (×8): qty 1

## 2021-09-08 MED ORDER — NYSTATIN 100000 UNIT/GM EX POWD
Freq: Two times a day (BID) | CUTANEOUS | Status: DC
  Administered 2021-09-10: 1 [IU] via TOPICAL
  Filled 2021-09-08 (×3): qty 15

## 2021-09-08 MED ORDER — LAMOTRIGINE 25 MG PO TABS
50.0000 mg | ORAL_TABLET | Freq: Every day | ORAL | Status: DC
  Filled 2021-09-08: qty 2

## 2021-09-08 NOTE — Progress Notes (Signed)
DAR NOTE: Patient presents with calm affect and mood.  Denies suicidal thoughts, auditory and visual hallucinations.  Described energy level as low with poor concentration.  Rates depression at 9, hopelessness at 9, and anxiety at 9.  Maintained on routine safety checks.  Medications given as prescribed.  Support and encouragement offered as needed.  Attended group and participated.  States goal for today is "getting into Rehab."  Patient observed socializing with peers in the dayroom.  Offered no complaint.

## 2021-09-08 NOTE — Group Note (Signed)
LCSW Group Therapy Note  Group Date: 09/08/2021 Start Time: 1005 End Time: 1105   Type of Therapy and Topic:  Group Therapy: Anger Cues and Responses  Participation Level:  Active   Description of Group:   In this group, patients learned how to recognize the physical, cognitive, emotional, and behavioral responses they have to anger-provoking situations.  They identified a recent time they became angry and how they reacted.  They analyzed how their reaction was possibly beneficial and how it was possibly unhelpful.  The group discussed a variety of healthier coping skills that could help with such a situation in the future.  Focus was placed on how helpful it is to recognize the underlying emotions to our anger, because working on those can lead to a more permanent solution as well as our ability to focus on the important rather than the urgent.  Therapeutic Goals: Patients will remember their last incident of anger and how they felt emotionally and physically, what their thoughts were at the time, and how they behaved. Patients will identify how their behavior at that time worked for them, as well as how it worked against them. Patients will explore possible new behaviors to use in future anger situations. Patients will learn that anger itself is normal and cannot be eliminated, and that healthier reactions can assist with resolving conflict rather than worsening situations.  Summary of Patient Progress:  Jamileth was active during the group. She shared a recent occurrence wherein feeling frustrated that she was not given enough time to eat her dinner led to anger. She stated she tends to bottle things up and then will explode on somebody who is not actually the one responsible.  She demonstrated growing insight into the subject matter, was respectful of peers, and participated throughout the entire session.  Therapeutic Modalities:   Cognitive Behavioral Therapy    Maretta Los, LCSWA 09/08/2021  12:03 PM

## 2021-09-08 NOTE — BHH Group Notes (Signed)
.  Psychoeducational Group Note    Date:09/08/2021 Time: 1300-1400    Purpose of Group: . The group focus' on teaching patients on how to identify their needs and how Life Skills:  A group where two lists are made. What people need and what are things that we do that are healthy. The lists are developed by the patients and it is explained that we often do the actions that are not healthy to get our list of needs met.  to develop the coping skills needed to get their needs met  Participation Level:  Active  Participation Quality:  Appropriate  Affect:  Appropriate  Cognitive:  Oriented  Insight:  Improving  Engagement in Group:  Engaged  Additional Comments:  Rates her energy at a 8.5. Participated fully in the group.  Denise Knight
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
## 2021-09-08 NOTE — Progress Notes (Addendum)
[facility] MD Progress Note  09/08/2021 5:05 PM Denise Knight  MRN:  [id number]   Subjective: [name] states " every year around this time I get really depressed."  Patient was seen and evaluated face-to-face.  She was observed sitting in dayroom interacting with peers.  Denying suicidal or homicidal ideations.  Denies auditory or visual hallucinations.  Reports ongoing ruminations regarding the passing of her mother which she states died 37 years ago 27-Oct-2023.  Patient reports mood irritability poor concentration and worsening depression.   Patient denied skin irritation, headaches, nausea or vomiting.  Reports ongoing nightmares and vivid dreams.  Discussed titration to Minipress patient was receptive to plan.  Patient reports history of suicide attempts.  States she gets overwhelmed easily.  States she has 2 adult children 57 and 35 years old.  She reports a good relationship.  States her last inpatient admission was " a while ago" chart reviewed patient positive for cocaine and marijuana.  She reports an increase in her appetite.  Support, encouragement  and reassurance was provided.  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 ([place]) Active Problems:   Cocaine abuse ([place])   Alcohol use disorder, moderate, dependence ([place])   Cannabis dependence with current use ([place])   Vitamin D deficiency  Total Time spent with patient: 15 minutes  Past Psychiatric History:   Past Medical History:  Past Medical History:  Diagnosis Date   Allergy    seasonal   Anxiety    Arthritis    "left knee" (07/05/2016)   Asthma    Bipolar 1 disorder ([place])    Depression    Diabetes mellitus without complication ([facility])    pre-   GERD (gastroesophageal reflux disease)    Hypertension    MI (mitral incompetence)    Post traumatic stress disorder    Schizophrenia ([place])     Stomach ulcer    Substance abuse ([place])    cocaine quit july 2020   Suicide attempt [facility])     Past Surgical History:  Procedure Laterality Date   CARDIAC CATHETERIZATION  07/05/2016   CARDIAC CATHETERIZATION N/A 07/05/2016   Procedure: Left Heart Cath and Coronary Angiography;  Surgeon: Adrian Prows, MD;  Location: [place] CV [facility];  Service: Cardiovascular;  Laterality: N/A;   CYST EXCISION Right    "wrist"   DILATION AND CURETTAGE OF UTERUS     FOOT SURGERY Bilateral    "corns removed"   LEFT HEART CATH AND CORONARY ANGIOGRAPHY N/A 12/24/2017   Procedure: LEFT HEART CATH AND CORONARY ANGIOGRAPHY;  Surgeon: Nigel Mormon, MD;  Location: [place] CV LAB;  Service: Cardiovascular;  Laterality: N/A;   TUBAL LIGATION     Family History:  Family History  Problem Relation Age of Onset   Breast cancer Other    Lung cancer Other    Congestive Heart Failure Other    Diabetes Other    Hypertension Other    Colon cancer Neg Hx    Colon polyps Neg Hx    Esophageal cancer Neg Hx    Stomach cancer Neg Hx    Rectal cancer Neg Hx    Family Psychiatric  History:  Social History:  Social History   Substance and Sexual Activity  Alcohol Use Yes   Alcohol/week: 7.0 standard drinks   Types: 7 Cans of beer per week     Social History   Substance and  Sexual Activity  Drug Use Yes   Types: Marijuana    Social History   Socioeconomic History   Marital status: Widowed    Spouse name: Not on file   Number of children: 2   Years of education: Not on file   Highest education level: Not on file  Occupational History   Occupation: disabled   Tobacco Use   Smoking status: Some Days    Packs/day: 0.25    Years: 35.00    Pack years: 8.75    Types: Cigarettes    Last attempt to quit: 05/30/2019    Years since quitting: 2.2   Smokeless tobacco: Never  Vaping Use   Vaping Use: Never used  Substance and Sexual Activity   Alcohol use: Yes    Alcohol/week: 7.0 standard drinks     Types: 7 Cans of beer per week   Drug use: Yes    Types: Marijuana   Sexual activity: Yes    Birth control/protection: Surgical  Other Topics Concern   Not on file  Social History Narrative   Not on file   Social Determinants of Health   Financial Resource Strain: Not on file  Food Insecurity: Not on file  Transportation Needs: Not on file  Physical Activity: Not on file  Stress: Not on file  Social Connections: Not on file   Additional Social History:                         Sleep: Fair  Appetite:  Fair  Current Medications: Current Facility-Administered Medications  Medication Dose Route Frequency Provider Last Rate Last Admin   acetaminophen (TYLENOL) tablet 650 mg  650 mg Oral Q6H PRN Ajibola, Ene A, NP   650 mg at 09/03/21 1853   albuterol (VENTOLIN HFA) 108 (90 Base) MCG/ACT inhaler 1-2 puff  1-2 puff Inhalation Q6H PRN France Ravens, MD   2 puff at 09/07/21 0826   alum & mag hydroxide-simeth (MAALOX/MYLANTA) 200-200-20 MG/5ML suspension 30 mL  30 mL Oral Q4H PRN Ajibola, Ene A, NP       amLODipine (NORVASC) tablet 10 mg  10 mg Oral Daily Nelda Marseille, Oswald Pott E, MD   10 mg at 09/08/21 0915   B-complex with vitamin C tablet 1 tablet  1 tablet Oral Daily Hill, Jackie Plum, MD   1 tablet at 09/08/21 0915   clotrimazole (GYNE-LOTRIMIN) vaginal cream 1 Applicatorful  1 Applicatorful Vaginal QHS Hill, Jackie Plum, MD   1 Applicatorful at 41/93/79 2222   hydrOXYzine (ATARAX/VISTARIL) tablet 25 mg  25 mg Oral TID PRN Ajibola, Ene A, NP   25 mg at 09/08/21 0633   [START ON 09/09/2021] lamoTRIgine (LAMICTAL) tablet 50 mg  50 mg Oral Daily Derrill Center, NP       magnesium hydroxide (MILK OF MAGNESIA) suspension 15 mL  15 mL Oral Daily PRN France Ravens, MD       melatonin tablet 10 mg  10 mg Oral QHS Nelda Marseille, Chandler Swiderski E, MD   10 mg at 09/07/21 2110   mirtazapine (REMERON) tablet 15 mg  15 mg Oral Aliene Altes, MD   15 mg at 09/07/21 2110   nitrofurantoin  (macrocrystal-monohydrate) (MACROBID) capsule 100 mg  100 mg Oral Q12H Sady Monaco E, MD   100 mg at 09/08/21 0918   pantoprazole (PROTONIX) EC tablet 40 mg  40 mg Oral QAC breakfast Bobbitt, Shalon E, NP   40 mg at 09/08/21 0634   polyethylene glycol (  MIRALAX / GLYCOLAX) packet 17 g  17 g Oral Daily PRN France Ravens, MD   17 g at 09/06/21 1542   prazosin (MINIPRESS) capsule 3 mg  3 mg Oral QHS Derrill Center, NP       risperiDONE (RISPERDAL) tablet 2 mg  2 mg Oral QHS Nelda Marseille, Rene Gonsoulin E, MD   2 mg at 09/07/21 2110   simvastatin (ZOCOR) tablet 40 mg  40 mg Oral q1800 Harlow Asa, MD   40 mg at 09/07/21 1708   Vitamin D (Ergocalciferol) (DRISDOL) capsule 50,000 Units  50,000 Units Oral Q7 days Maida Sale, MD   50,000 Units at 09/04/21 0962    Lab Results: No results found for this or any previous visit (from the past 48 hour(s)).  Blood Alcohol level:  Lab Results  Component Value Date   ETH <10 09/02/2021   ETH 33 (H) 83/66/2947    Metabolic Disorder Labs: Lab Results  Component Value Date   HGBA1C 5.7 (H) 09/03/2021   MPG 116.89 09/03/2021   MPG 116.89 01/13/2020   No results found for: PROLACTIN Lab Results  Component Value Date   CHOL 230 (H) 09/03/2021   TRIG 111 09/03/2021   HDL 62 09/03/2021   CHOLHDL 3.7 09/03/2021   VLDL 22 09/03/2021   LDLCALC 146 (H) 09/03/2021   LDLCALC 114 (H) 01/13/2020    Physical Findings: AIMS: Facial and Oral Movements Muscles of Facial Expression: None, normal Lips and Perioral Area: None, normal Jaw: None, normal Tongue: None, normal,Extremity Movements Upper (arms, wrists, hands, fingers): None, normal Lower (legs, knees, ankles, toes): None, normal, Trunk Movements Neck, shoulders, hips: None, normal, Overall Severity Severity of abnormal movements (highest score from questions above): None, normal Incapacitation due to abnormal movements: None, normal Patient's awareness of abnormal movements (rate only patient's  report): No Awareness, Dental Status Current problems with teeth and/or dentures?: No Does patient usually wear dentures?: No  CIWA:    COWS:     Musculoskeletal: Strength & Muscle Tone: within normal limits Gait & Station: normal Patient leans: N/A  Psychiatric Specialty Exam:  Presentation  General Appearance: Casual  Eye Contact:Good  Speech:Clear and Coherent  Speech Volume:Normal  Handedness:No data recorded  Mood and Affect  Mood:Euthymic  Affect:Congruent   Thought Process  Thought Processes:Coherent; Goal Directed; Linear  Descriptions of Associations:Intact  Orientation:Full (Time, Place and Person)  Thought Content:Logical  History of Schizophrenia/Schizoaffective disorder:No  Duration of Psychotic Symptoms:No data recorded Hallucinations:Denied Ideas of Reference:None  Suicidal Thoughts:Denied Homicidal Thoughts:Denied  Sensorium  Memory:Immediate Good; Recent Good; Remote Good  Judgment:Fair  Insight:Fair   Executive Functions  Concentration:Fair  Attention Span:Fair  [place]   Psychomotor Activity  Psychomotor Activity:No data recorded  Assets  Assets:Communication Skills; Desire for Improvement; Resilience   Sleep  Sleep:6.5 hours   Physical Exam: Physical Exam Vitals reviewed.  Neurological:     Mental Status: She is alert.  Psychiatric:        Mood and Affect: Mood normal.        Thought Content: Thought content normal.   Review of Systems  Psychiatric/Behavioral:  Positive for depression. Negative for suicidal ideas. The patient is nervous/anxious.   All other systems reviewed and are negative. Blood pressure 103/73, pulse (!) 102, temperature 97.9 F (36.6 C), temperature source Oral, resp. rate 18, height 5' 8.5" (1.74 m), weight 88.5 kg, SpO2 95 %. Body mass index is 29.22 kg/m.   Treatment Plan Summary: Daily contact  with patient to assess and evaluate  symptoms and progress in treatment and Medication management   Bipolar II disorder Increase Minipress to 3 mg p.o. nightly for residual nightmares Continue Remeron 15 mg p.o. nightly Continue Risperdal 2 mg p.o. nightly Continue Lamictal 32m daily   CSW to continue working on discharge disposition Patient encouraged to participate within the therapeutic milieu  TDerrill Center NP 09/08/2021, 5:05 PM  I have reviewed the patient's chart and discussed the case with the APP. I agree with the documented medication change. AViann Fish MD, FAlda Ponder

## 2021-09-08 NOTE — B[facility] Group Notes (Signed)
Psychoeducational Group Note  Date: 09-08-21 Time:  1300  Group Topic/Focus:  Making Healthy Choices:   The focus of this group is to help patients identify negative/unhealthy choices they were using prior to admission and identify positive/healthier coping strategies to replace them upon discharge.In this group.   Participation Level:  Active  Participation Quality:  Appropriate  Affect:  Appropriate  Cognitive:  Oriented  Insight:  Improving  Engagement in Group:  Engaged  Additional Comments:.. attended and participated  Paulino Rily

## 2021-09-08 NOTE — Progress Notes (Signed)
Adult Psychoeducational Group Note  Date:  09/08/2021 Time:  11:21 PM  Group Topic/Focus:  Wrap-Up Group:   The focus of this group is to help patients review their daily goal of treatment and discuss progress on daily workbooks.  Participation Level:  Active  Participation Quality:  Appropriate  Affect:  Appropriate  Cognitive:  Appropriate  Insight: Appropriate  Engagement in Group:  Engaged  Modes of Intervention:  Discussion  Additional Comments:  Patient rates her day 8/10. Pt says that she attended group and also had the negative that there were too many groups back to back with no time to process what she had heard. Pt said she is learning that she can do anything once she sets her mind to it. Pt says her goal is to get long term rehab and not a 28-day program. Pt says she wants to go "bed to bed" because if she is discharged home she will use drugs again. She doesn't want to be clean for a month; she wants to be clean for the rest of her life.  Kyliee Ortego A Sherrod Toothman 09/08/2021, 11:21 PM

## 2021-09-09 DIAGNOSIS — F3181 Bipolar II disorder: Secondary | ICD-10-CM | POA: Diagnosis not present

## 2021-09-09 MED ORDER — DICLOFENAC SODIUM 1 % EX GEL
2.0000 g | Freq: Two times a day (BID) | CUTANEOUS | Status: DC | PRN
  Administered 2021-09-09: 2 g via TOPICAL
  Filled 2021-09-09: qty 100

## 2021-09-09 NOTE — Group Note (Signed)
Group Topic: Goal Setting  Group Date: 09/09/2021 Start Time: 0900 End Time: 1000 Facilitators: Kathreen Cornfield  Department: [facility]  Number of Participants: 6  Group Focus: goals/reality orientation Treatment Modality:  Leisure Development Interventions utilized were support Purpose: orientation of unit and goal setting  Name: Denise Knight Date of Birth: 1966/11/09  MR: [id number]    Level of Participation: active Quality of Participation: engaged Interactions with others: appropriate Mood/Affect: appropriate Triggers (if applicable): N/A Cognition:  Progress: Moderate Response:  Plan: patient will be encouraged to work on goals.  

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
## 2021-09-09 NOTE — Progress Notes (Signed)
   09/09/21 2130  Psych Admission Type (Psych Patients Only)  Admission Status Voluntary  Psychosocial Assessment  Patient Complaints Anxiety  Eye Contact Fair  Facial Expression Anxious  Affect Appropriate to circumstance  Speech Logical/coherent  Interaction Assertive  Motor Activity Other (Comment) (wnl)  Appearance/Hygiene Unremarkable  Behavior Characteristics Appropriate to situation;Anxious  Mood Anxious;Pleasant  Aggressive Behavior  Effect No apparent injury  Thought Process  Coherency WDL  Content WDL  Delusions None reported or observed  Perception WDL  Hallucination None reported or observed  Judgment WDL  Confusion WDL  Danger to Self  Current suicidal ideation? Denies  Self-Injurious Behavior No self-injurious ideation or behavior indicators observed or expressed   Agreement Not to Harm Self Yes  Description of Agreement verbal  Danger to Others  Danger to Others None reported or observed

## 2021-09-09 NOTE — BHH Group Notes (Signed)
Denise Knight participated and contributed to wrap up group

## 2021-09-09 NOTE — Group Note (Signed)
LCSW Group Therapy Note   Group Date: 09/09/2021 Start Time: 1000 End Time: 1100   Type of Therapy and Topic:  Group Therapy: Boundaries  Participation Level:  Active  Description of Group: This group will address the use of boundaries in their personal lives. Patients will explore why boundaries are important, the difference between healthy and unhealthy boundaries, and negative and postive outcomes of different boundaries and will look at how boundaries can be crossed.  Patients will be encouraged to identify current boundaries in their own lives and identify what kind of boundary is being set. Facilitators will guide patients in utilizing problem-solving interventions to address and correct types boundaries being used and to address when no boundary is being used. Understanding and applying boundaries will be explored and addressed for obtaining and maintaining a balanced life. Patients will be encouraged to explore ways to assertively make their boundaries and needs known to significant others in their lives, using other group members and facilitator for role play, support, and feedback.  Therapeutic Goals:  1.  Patient will identify areas in their life where setting clear boundaries could be  used to improve their life.  2.  Patient will identify signs/triggers that a boundary is not being respected. 3.  Patient will identify two ways to set boundaries in order to achieve balance in  their lives: 4.  Patient will demonstrate ability to communicate their needs and set boundaries  through discussion and/or role plays  Summary of Patient Progress:  Teresa was fully present/active throughout portions of the session and proved open to feedback from [place] and peers. She did arrive late and leave early.  Patient demonstrated good insight into the subject matter, was respectful of peers, and was present throughout the entire session.  Therapeutic Modalities:   Cognitive Behavioral  Therapy Solution-Focused Therapy  Maretta Los, LCSWA 09/09/2021  2:54 PM

## 2021-09-09 NOTE — BHH Group Notes (Signed)
[facility] Group Notes:  (Nursing/MHT/Case Management/Adjunct)  Date:  09/09/2021  Time:  4:48 PM  Type of Therapy:  Music Therapy  Participation Level:  Active  Participation Quality:  Appropriate  Affect:  Appropriate  Cognitive:  Appropriate  Insight:  Appropriate  Engagement in Group:  Engaged  Modes of Intervention:  Activity  Summary of Progress/Problems: Patient was engaged in group by participating in the sing-a-long . Patient stated, "it lifted her spirits."  Jerrye Beavers 09/09/2021, 4:48 PM

## 2021-09-09 NOTE — Plan of Care (Signed)
AIMS assessed today: 0 No cogwheel rigidity noted, no abnormal extremity movements noted, no abnormal facial movements noted, no tongue fasciculations noted  France Ravens, MD PGY1 Psychiatry Resident

## 2021-09-10 DIAGNOSIS — F3181 Bipolar II disorder: Secondary | ICD-10-CM | POA: Diagnosis not present

## 2021-09-10 MED ORDER — CLONIDINE HCL 0.1 MG PO TABS
0.1000 mg | ORAL_TABLET | Freq: Every day | ORAL | Status: DC
  Administered 2021-09-10 – 2021-09-11 (×2): 0.1 mg via ORAL
  Filled 2021-09-10 (×3): qty 1

## 2021-09-10 NOTE — Group Note (Signed)
LCSW Group Therapy Note   Group Date: 09/10/2021 Start Time: 1300 End Time: 70  LCSW Group Therapy Note  Type of Therapy and Topic:  Group Therapy:  Fear  Participation Level:  Active  Description of Group: In this process group, patients discussed things that make feel them feel nervous or scared.  Patients discussed what they think about when they feel this emotions.  Patients were given the opportunity to share openly and support each others.  The group discussed where they feel their fears in their body.  Patients were encouraged to identify new coping mechanisms to use when fears arrise.  Therapeutic Goals Patient will verbalize fears and what thoughts occur when they feel this emotion. Patients will verbalize where on their body they feel fear. Patients will explore coping mechanisms they can use in the future when fear arises.   Summary of Patient Progress: During introductions pt shared her name and stated her fear about discharge was that if she doesn't go to a rehab that she will fall back into the devil's world. Pt participated in group appropriately and was active during discussion.    Therapeutic Modalities Cognitive Behavioral Therapy Motivational Interviewing  Denise Knight 09/10/2021  1:46 PM

## 2021-09-10 NOTE — Group Note (Signed)
Occupational Therapy Group Note  Group Topic:Feelings Management  Group Date: 09/10/2021 Start Time: 1400 End Time: 1445 Facilitators: Ponciano Ort, OT/L    Group Description: Group encouraged increased participation and engagement through discussion focused on STRENGTHS. Patients were encouraged to fill out a worksheet to structure discussion, identifying ten strengths that they currently have. Patients were then instructed to utilize various symbols to identify which strengths help them in their relationships, school/profession, and leisure participation. Discussion also focused on identifying one strength they do not currently have but would like. Discussion within the group followed with patients sharing their responses and highlighting their own personal strengths.   Therapeutic Goals: Identify strengths vs weaknesses Discuss and identify ways we can highlight our strengths  Participation Level: Active   Participation Quality: Independent   Behavior: Calm, Cooperative, and Interactive    Speech/Thought Process: Focused   Affect/Mood: Euthymic   Insight: Fair   Judgement: Fair   Individualization: Denise Knight was active in their participation of group discussion/activity. Pt identified "god, my grandchildren, and my children" as something that gives them strength. Appeared engaged in activity and noted benefit post discussion.   Modes of Intervention: Activity, Discussion, and Education  Patient Response to Interventions:  Attentive, Engaged, Interested , and Receptive   Plan: Continue to engage patient in OT groups 2 - 3x/week.  09/10/2021  Ponciano Ort, OT/L

## 2021-09-10 NOTE — Progress Notes (Signed)
   09/10/21 0608  Vital Signs  Pulse Rate (!) 102  Pulse Rate Source Monitor  BP 122/82  BP Location Left Arm  BP Method Automatic  Patient Position (if appropriate) Standing  Oxygen Therapy  SpO2 100 %   D: Patient denies SI/HI/AVH. Patient rates anxiety 8/10 and depression 5/10. Pt. Complained of knee pain.  A:  Patient took scheduled medicine.  Support and encouragement provided Routine safety checks conducted every 15 minutes. Patient  Informed to notify staff with any concerns.   R:  Safety maintained.

## 2021-09-10 NOTE — BHH Counselor (Signed)
CSW provided the Pt with the phone number to [facility] (the women's residential treatment for [place]).  CSW explained that for this program the Pt will need $4000 before being accepted to the program but there may be scholarships available.  CSW explained to the Pt that she would need to contact the agency first to ask about scholarships and apply to the program.  The Pt stated that she would call to check but also stated that if the program asked her to pay the $4000 then she would be unable to attend.     CSW will also resend/fax the Pt's information to [facility] to see if they will accept the Pt now that she has no current suicidal thoughts.  CSW will follow-up to see if the Pt was accepted or not and will let the Pt know this information once it is received.

## 2021-09-10 NOTE — Progress Notes (Signed)
Adult Psychoeducational Group Note  Date:  09/10/2021 Time:  10:41 AM  Group Topic/Focus:  Goals Group:   The focus of this group is to help patients establish daily goals to achieve during treatment and discuss how the patient can incorporate goal setting into their daily lives to aide in recovery.  Participation Level:  Active  Participation Quality:  Appropriate  Affect:  Appropriate  Cognitive:  Appropriate  Insight: Appropriate  Engagement in Group:  Engaged  Modes of Intervention:  Discussion  Additional Comments:  Pt stated her goal for the day is to find a rehab.  Tonia Brooms D 09/10/2021, 10:41 AM

## 2021-09-10 NOTE — Progress Notes (Signed)
[facility] MD Progress Note  09/10/2021 11:17 AM Denise Knight  MRN:  [id number] Subjective:  Patient is a 55 year old female with reported psychiatric history of depression, bipolar, PTSD admitted to [facility] from [facility] ED due to worsening suicidal ideation and suicide attempt with Advil overdose.  Chart Review, 24 hr Events: The patient's chart was reviewed and nursing notes were reviewed. The patient's case was discussed in multidisciplinary team meeting.  Per MAR: - Patient is compliant with scheduled meds. - PRNs: Tylenol, albuterol, Voltaren gel, hydroxyzine x2 Per RN notes, no documented behavioral issues and is attending group. Patient slept, 6.5 hours   TODAY'S INTERVIEW Patient seen and assessed with attending Dr. Nelda Marseille.  Patient states she is doing good today rating it a 9.5 out of 10. Patient states that she "only had 2 nightmares".  Patient states that she has been sleeping more appropriately but does still got up at 3:00 AM and could not go back to sleep.  Discussed with patient the possibility to switch from prazosin to clonidine which could benefit patient more with her nightmares.  Patient agreeable to a trial of this tonight in order to assess if she still has nightmares.  Patient has no physical complaints at this time stating that she has regular bowel movements, eating appropriately, hydrating appropriately, and has had minimum leg pain status post Voltaren gel.  Discussed with patient discharge planning with regards to resending her application to [facility] treatment center, as well as providing additional options including teen challenge should patient be denied from [place] yet again.  Patient verbalized understanding and stated she would attempt to call teen challenge but is limited by financial strain should patient not receive any scholarships.  Patient denies present SI/HI/AVH.  Patient denies first rank symptoms, ideas of reference, thought broadcasting.   Patient specifically denies seeing people by the window when she is sleeping or strangers in the group room that others cannot see.  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 ([place]) Active Problems:   Cocaine abuse ([place])   Alcohol use disorder, moderate, dependence ([place])   Cannabis dependence with current use ([place])   Vitamin D deficiency  Total Time spent with patient: 25 minutes  Past Psychiatric History: see H&P  Past Medical History:  Past Medical History:  Diagnosis Date   Allergy    seasonal   Anxiety    Arthritis    "left knee" (07/05/2016)   Asthma    Bipolar 1 disorder ([place])    Depression    Diabetes mellitus without complication (HCC)    pre-   GERD (gastroesophageal reflux disease)    Hypertension    MI (mitral incompetence)    Post traumatic stress disorder    Schizophrenia ([place])    Stomach ulcer    Substance abuse ([place])    cocaine quit july 2020   Suicide attempt [facility])     Past Surgical History:  Procedure Laterality Date   CARDIAC CATHETERIZATION  07/05/2016   CARDIAC CATHETERIZATION N/A 07/05/2016   Procedure: Left Heart Cath and Coronary Angiography;  Surgeon: Adrian Prows, MD;  Location: [place] [facility];  Service: Cardiovascular;  Laterality: N/A;   CYST EXCISION Right    "wrist"   DILATION AND CURETTAGE OF UTERUS     FOOT SURGERY Bilateral    "corns removed"   LEFT HEART CATH AND CORONARY ANGIOGRAPHY N/A 12/24/2017   Procedure: LEFT  HEART CATH AND CORONARY ANGIOGRAPHY;  Surgeon: Nigel Mormon, MD;  Location: [place] [facility];  Service: Cardiovascular;  Laterality: N/A;   TUBAL LIGATION     Family History:  Family History  Problem Relation Age of Onset   Breast cancer Other    Lung cancer Other    Congestive Heart Failure Other    Diabetes Other    Hypertension Other    Colon cancer Neg Hx    Colon  polyps Neg Hx    Esophageal cancer Neg Hx    Stomach cancer Neg Hx    Rectal cancer Neg Hx    Family Psychiatric  History: seee H&P Social History:  Social History   Substance and Sexual Activity  Alcohol Use Yes   Alcohol/week: 7.0 standard drinks   Types: 7 Cans of beer per week     Social History   Substance and Sexual Activity  Drug Use Yes   Types: Marijuana    Social History   Socioeconomic History   Marital status: Widowed    Spouse name: Not on file   Number of children: 2   Years of education: Not on file   Highest education level: Not on file  Occupational History   Occupation: disabled   Tobacco Use   Smoking status: Some Days    Packs/day: 0.25    Years: 35.00    Pack years: 8.75    Types: Cigarettes    Last attempt to quit: 05/30/2019    Years since quitting: 2.2   Smokeless tobacco: Never  Vaping Use   Vaping Use: Never used  Substance and Sexual Activity   Alcohol use: Yes    Alcohol/week: 7.0 standard drinks    Types: 7 Cans of beer per week   Drug use: Yes    Types: Marijuana   Sexual activity: Yes    Birth control/protection: Surgical  Other Topics Concern   Not on file  Social History Narrative   Not on file   Social Determinants of Health   Financial Resource Strain: Not on file  Food Insecurity: Not on file  Transportation Needs: Not on file  Physical Activity: Not on file  Stress: Not on file  Social Connections: Not on file   Sleep: Poor  Appetite:  Fair  Current Medications: Current Facility-Administered Medications  Medication Dose Route Frequency Provider Last Rate Last Admin   acetaminophen (TYLENOL) tablet 650 mg  650 mg Oral Q6H PRN Ajibola, Ene A, NP   650 mg at 09/10/21 0754   albuterol (VENTOLIN HFA) 108 (90 Base) MCG/ACT inhaler 1-2 puff  1-2 puff Inhalation Q6H PRN France Ravens, MD   2 puff at 09/09/21 0834   alum & mag hydroxide-simeth (MAALOX/MYLANTA) 200-200-20 MG/5ML suspension 30 mL  30 mL Oral Q4H PRN  Ajibola, Ene A, NP       amLODipine (NORVASC) tablet 10 mg  10 mg Oral Daily Nelda Marseille, Amy E, MD   10 mg at 09/10/21 6606   B-complex with vitamin C tablet 1 tablet  1 tablet Oral Daily Maida Sale, MD   1 tablet at 09/10/21 3016   clotrimazole (GYNE-LOTRIMIN) vaginal cream 1 Applicatorful  1 Applicatorful Vaginal QHS Hill, Jackie Plum, MD   1 Applicatorful at 11/19/30 2121   diclofenac Sodium (VOLTAREN) 1 % topical gel 2 g  2 g Topical BID PRN France Ravens, MD   2 g at 09/09/21 1333   hydrOXYzine (ATARAX/VISTARIL) tablet 25 mg  25 mg Oral TID PRN  Ajibola, Ene A, NP   25 mg at 09/10/21 0754   lamoTRIgine (LAMICTAL) tablet 25 mg  25 mg Oral Daily Nelda Marseille, Amy E, MD   25 mg at 09/10/21 0753   magnesium hydroxide (MILK OF MAGNESIA) suspension 15 mL  15 mL Oral Daily PRN France Ravens, MD       melatonin tablet 10 mg  10 mg Oral QHS Nelda Marseille, Amy E, MD   10 mg at 09/09/21 2135   mirtazapine (REMERON) tablet 15 mg  15 mg Oral Aliene Altes, MD   15 mg at 09/09/21 2135   nitrofurantoin (macrocrystal-monohydrate) (MACROBID) capsule 100 mg  100 mg Oral Q12H Nelda Marseille, Amy E, MD   100 mg at 09/10/21 6629   nystatin (MYCOSTATIN/NYSTOP) topical powder   Topical BID Derrill Center, NP   1 Units at 09/10/21 0754   pantoprazole (PROTONIX) EC tablet 40 mg  40 mg Oral QAC breakfast Bobbitt, Shalon E, NP   40 mg at 09/10/21 0641   polyethylene glycol (MIRALAX / GLYCOLAX) packet 17 g  17 g Oral Daily PRN France Ravens, MD   17 g at 09/06/21 1542   prazosin (MINIPRESS) capsule 3 mg  3 mg Oral QHS Derrill Center, NP   3 mg at 09/09/21 2134   risperiDONE (RISPERDAL) tablet 2 mg  2 mg Oral QHS Nelda Marseille, Amy E, MD   2 mg at 09/09/21 2135   simvastatin (ZOCOR) tablet 40 mg  40 mg Oral q1800 Harlow Asa, MD   40 mg at 09/09/21 1812   Vitamin D (Ergocalciferol) (DRISDOL) capsule 50,000 Units  50,000 Units Oral Q7 days Maida Sale, MD   50,000 Units at 09/04/21 4765    Lab Results:  No results  found for this or any previous visit (from the past 69 hour(s)).   Blood Alcohol level:  Lab Results  Component Value Date   ETH <10 09/02/2021   ETH 33 (H) 46/50/3546    Metabolic Disorder Labs: Lab Results  Component Value Date   HGBA1C 5.7 (H) 09/03/2021   MPG 116.89 09/03/2021   MPG 116.89 01/13/2020   No results found for: PROLACTIN Lab Results  Component Value Date   CHOL 230 (H) 09/03/2021   TRIG 111 09/03/2021   HDL 62 09/03/2021   CHOLHDL 3.7 09/03/2021   VLDL 22 09/03/2021   LDLCALC 146 (H) 09/03/2021   LDLCALC 114 (H) 01/13/2020    Physical Findings:   Musculoskeletal: Strength & Muscle Tone: within normal limits Gait & Station: normal Patient leans: N/A  Psychiatric Specialty Exam:  Presentation  General Appearance: Casually dressed, adequate hygiene  Eye Contact:Good  Speech:Clear and Coherent  Speech Volume:Normal , regular rate  Handedness:No data recorded  Mood and Affect  Mood: described as improving - appears euthymic  Affect: congruent  Thought Process  Thought Processes:Coherent; Goal Directed; Linear  Descriptions of Associations:Intact  Orientation:Full (Time, Place and Person)  Thought Content:Denies AVH, ideas of reference, first rank symptoms or paranoia and is not grossly responding to internal/external stimuli on exam  History of Schizophrenia/Schizoaffective disorder:No  Duration of Psychotic Symptoms:No data recorded Hallucinations:Denied today  Ideas of Reference:None  Suicidal Thoughts:Denied   Homicidal Thoughts:Denied   Sensorium  Memory:Immediate Good; Recent Good; Remote Good  Judgment:Fair  Insight:Fair   Executive Functions  Concentration:Fair  Attention Span:Fair  [place]   Psychomotor Activity  Psychomotor Activity:normal no akathisias or tremors noted   Assets  Assets:Communication Skills; Desire for Improvement;  Resilience   Sleep   Sleep:6.5 hours    Physical Exam Vitals and nursing note reviewed.  Constitutional:      Appearance: Normal appearance. She is normal weight.  HENT:     Head: Normocephalic and atraumatic.  Pulmonary:     Effort: Pulmonary effort is normal.  Neurological:     General: No focal deficit present.     Mental Status: She is oriented to person, place, and time.   Review of Systems  Respiratory:  Negative for shortness of breath.   Cardiovascular:  Negative for chest pain.  Gastrointestinal:  Negative for abdominal pain, constipation, diarrhea, heartburn, nausea and vomiting.  Neurological:  Negative for headaches.  Blood pressure 122/82, pulse (!) 102, temperature 98 F (36.7 C), temperature source Oral, resp. rate 18, height 5' 8.5" (1.74 m), weight 88.5 kg, SpO2 100 %. Body mass index is 29.22 kg/m.   Treatment Plan Summary: Daily contact with patient to assess and evaluate symptoms and progress in treatment and Medication management  ASSESSMENT Patient is a 55 year old female with reported psychiatric history of depression, bipolar, PTSD admitted to [facility] from [facility] ED due to worsening suicidal ideation and suicide attempt with Advil overdose and smoking crack.    Patient endorsing improved mood and denies SI or signs of psychosis.  Patient still endorsing nightmares but states that they are less.  Plan to switch from prazosin to clonidine tonight to see if patient may have better effect on clonidine for nightmares.  PLAN Psychiatric Problems Bipolar Disorder, Type II depressive episode severe with psychotic features (r/o MDD -severe, r/o substance induced mood d/o) PTSD by hx -Continue Remeron 15 nightly for PTSD, depression, and sleep -Continue Lamictal 25 mg daily for mood stability and bipolar disorder titrating up over time. -Melatonin 10 mg for sleep - Discontinued Trazodone - Discontinue Prazosin - Start Clonidine 0.1 mg qhs for nightmares, will continue to monitor  blood pressure and orthostasis.  Discussed with patient risk/benefits/side effects and patient is agreeable to medication trial. -Continue Risperdal 2 mg qhs for psychotic symptoms and mood stabilization.    -Qtc (10/26) 452; A1c 5.7, Cholesterol 230 and LDL 146  Stimulant use d/o - cocaine type Alcohol use d/o Cannabis use d/o - Counseled on need to abstain from substances and she is interested in residential rehab after discharge - No acute signs of withdrawal at this time - SW assisting with rehab referrals  Medical Problems  Elevated TSH, likely 2/2 stress response (r/o subclinical hypothyroidism) Free T4 0.7, Free T3 2.2 both WNL - Will need repeat TSH in 4-6 weeks  Vitamin D Deficiency -Cholecalciferol 50000 units every week  Hyperammonemia (RESOLVED) and constipation (RESOLVED) Discontinued Lactulose Ammonia 38 (10/24) -> 31 (10/26), normalized Miralax for contipation PRN and patient drinking prune juice bid  Leukocytosis, RESOLVED - WBC downtrending to 7.0  Normocytic Anemia Hgb 11.8 -Will continue to monitor  Elevated HCG -Negative UPT Quantitative hCG 6, likely menopausal changes- will need GYN f/u after discharge  Abnormal UA (cloudy, 1.004, small hgb, negative protein, trace leucocyte, rare bacteria) -Urine Culture reflex pending - Continue Macrobid 129m bid for 5 days  Chest Pain with Prolonged QTC prior to admission -Likely musculoskeletal in nature -Most recent QTC 452 -will continue to monitor   Hypercholesteremia - Cholesterol 230 and LDL 146  - Continue Zocor 45mdaily   Asthma -Albuterol inhaler prn for wheezing/shortness of breath - CXR negative   HTN - Continue Norvasc 1025maily   Vertigo -We will continue  to monitor -Recommend PCP follow-up to monitor vertigo  Refractory Reported Yeast Infection Clotrimazole cream qhs x3days  Knee Pain -Voltaren gel ointment bid prn  PRNs Tylenol 650 mg for mild pain Maalox/Mylanta 30 mL for  indigestion Bendaryl q6hr for itching/allergies Hydroxyzine 25 mg tid for anxiety Milk of Magnesia 30 mL for constipation   3. Safety and Monitoring: Voluntary admission to inpatient psychiatric unit for safety, stabilization and treatment Daily contact with patient to assess and evaluate symptoms and progress in treatment Patient's case to be discussed in multi-disciplinary team meeting Observation Level : q15 minute checks Vital signs: q12 hours Precautions: suicide, elopement, and assault   4. Discharge Planning: Social work and case management to assist with discharge planning and identification of hospital follow-up needs prior to discharge Estimated LOS: 7-10 days Discharge Concerns: Need to establish a safety plan; Medication compliance and effectiveness Discharge Goals: Return home with outpatient referrals for mental health follow-up including medication management/psychotherapy  France Ravens, MD 09/10/2021, 11:17 AM

## 2021-09-10 NOTE — BHH Group Notes (Signed)
Pt attended and contributed to group

## 2021-09-11 MED ORDER — NYSTATIN 100000 UNIT/GM EX POWD
Freq: Two times a day (BID) | CUTANEOUS | Status: DC | PRN
  Filled 2021-09-11: qty 15

## 2021-09-11 MED ORDER — MELATONIN 3 MG PO TABS
3.0000 mg | ORAL_TABLET | Freq: Every day | ORAL | Status: DC
  Administered 2021-09-11 – 2021-09-12 (×2): 3 mg via ORAL
  Filled 2021-09-11 (×5): qty 1

## 2021-09-11 MED ORDER — MELATONIN 3 MG PO TABS: 3.0000 mg | ORAL_TABLET | Freq: Every day | ORAL | Status: DC

## 2021-09-11 NOTE — Group Note (Signed)
Recreation Therapy Group Note   Group Topic:Animal Assisted Therapy   Group Date: 09/11/2021 Start Time: 1430 End Time: 1500 Facilitators: Bailley [place],LRT/CTRS Location: [place]  AAA/T Program Assumption of Risk Form signed by Patient/ or Parent Legal Guardian Yes  Patient is free of allergies or severe asthma Yes  Patient reports no fear of animals Yes  Patient reports no history of cruelty to animals Yes  Patient understands his/her participation is voluntary Yes  Patient washes hands before animal contact Yes  Patient washes hands after animal contact Yes   Affect/Mood: Appropriate   Participation Level: Engaged   Participation Quality: Independent   Behavior: Appropriate   Speech/Thought Process: Focused   Insight: Good   Judgement: Good   Modes of Intervention: Nurse, adult   Patient Response to Interventions:  Engaged   Education Outcome:  Acknowledges education and In group clarification offered    Clinical Observations/Individualized Feedback: Pt attended and participated.  Pt engaged with therapy dog and asked questions.    Plan: Continue to engage patient in RT group sessions 2-3x/week.   Victorino Sparrow, LRT/CTRS 09/11/2021 3:15 PM

## 2021-09-11 NOTE — BHH Group Notes (Signed)
Spiritual care group on grief and loss facilitated by chaplain Janne Napoleon, [facility]   Group Goal:   Support / Education around grief and loss   Members engage in facilitated group support and psycho-social education.   Group Description:   Following introductions and group rules, group members engaged in facilitated group dialog and support around topic of loss, with particular support around experiences of loss in their lives. Group Identified types of loss (relationships / self / things) and identified patterns, circumstances, and changes that precipitate losses. Reflected on thoughts / feelings around loss, normalized grief responses, and recognized variety in grief experience. Group noted Worden's four tasks of grief in discussion.   Group drew on Adlerian / Rogerian, narrative, MI,   Patient Progress: Denise Knight attended group and was engaged in the conversation.  She shared that she had been told she had never grieved a loss and she didn't know how. After the group, I asked her if she wanted to meet one on one and she said that she had gotten something from the group and was okay for the moment.  [place], [place] Pager, [phone] 7:32 PM

## 2021-09-11 NOTE — Hospital Course (Addendum)
[name] is a 55 y.o. female with a PPHx of BPT2-depression, PUD (EtOH, MJ, cocaine, tobacco), PTSD, SA x3, multiple IPPsych hospitalization who presented to [facility] ED for CP then admitted to [facility] (09/02/2021) for management of active SI (wanting to OD on advil) and SA (bottle of aleve 3 days prior admission) LOS: 11 days  ED Course: Patient presented unaccompanied to [facility] with CC of CP that was found to be reproducible and tender to palpation. EKG was nonischemic. Labs were reassuring including troponin x2. Chest x-ray was clear. ED had low suspicion for ACS, PE, dissection, other acute cardiac event at that time. No evidence of toxic ingestion and was medically cleared.   Admission Labs:  UDS THC+ and cocaine+, WBC 13.1, i-STAT hCG 5.2 (LMP 6 years ago), negative troponin, negative salicylate, negative acetaminophen level, negative, alcohol level.  Brief Hospital Course: Patient has a history of noncompliance, started her on Lamictal 25 mg for mood stabilization, Risperdal for AVH, Remeron for insomnia. Towards the end of hospitalization, the patient's main concern was her nightmares causing insomnia. She was trialed on prazosin, clonidine which was ineffective.  She was very concerned with her insomnia, however she denied daytime somnolence.  Suspect that her insomnia is secondary to OSA vs. Orthopnea and possible underlying CHF.  Started her on Pulmicort, for reported history of asthma with SOB that was relieved with her albuterol.  She will need PFTs.   Plan:  #BPDT2, depressive episode, with psychotic features  Presented with symptoms of depression, SI, mood lability, AVH. However reported history of hypomania, but mainly depressive episodes. Started patient on Lamictal, will have her follow up outpatient for titration by 25 mg every 2 weeks until 200 mg. Started Risperdal for AVH. Lamictal 25 mg daily (started 09/02/2021) Risperdal 2 mg  nightly  #PUD (EtOH, MJ, cocaine, tobacco) Working with SW to find placement at inpatient rehab facility. Barrier is patient's multiple SI. Pending placement  #Insomnia Suspected secondary to OSA and depression. Patient complained of multiple nightmares and night, along with snoring, and witnessed apneic spells. Recommended patient see PCP for sleep study referral. Patient was also tried on prazosin and clonidine for nightmares, but discontinued due to ineffectiveness. F/u PCP for sleep study referral Remeron 15 mg qHS Melatonin 5 mg qHS  HLD Simvastatin 40 mg daily  HTN Amlodipine 10 mg daily  Moderate persistent asthma Patient uses her albuterol inhaler daily, for DOE. Currently unsure if SOB is 2/2 possible CHF vs. Anxiety. Recommended patient follow up with PCP for PFT.  Budesonide 0.25 mg BID Albuterol PRN  Psychotropics during hospitalization: Prazosin for nightmares x2 days, ineffective Clonidine for nightmares x2 days, ineffective  Anti-microbials during hospitalization: Macrobid 100 mg BID  Past psych med trials: Amantadine 100 mg BID Zoloft 25 mg daily Lithium Seroquel Prozac  OP F/u: Asthma vs. COPD. Needs PFTs OSA. Needs sleep study CP, hx of NSTEMI per chart, CHF. Needs ECHO R LE [place]. CHF vs. Amlodipine. Needs re-evaluation. HLD

## 2021-09-11 NOTE — Progress Notes (Signed)
Pt presents pleasant, denied SI/HI/AVH or self harm intent. Stated she had a good day but still feeling constipated as she had no relief from glycolax administered earlier. She was administered MOM pending results. She also c/o anxiety and she was administered PRN Hydroxyzine with good effect. She was observed attending evening group with no behavioral issues or falls noted thus far. Q15 min observations maintained for safety and support provided as needed.    09/10/21 2200  Psych Admission Type (Psych Patients Only)  Admission Status Voluntary  Psychosocial Assessment  Patient Complaints Anxiety (Constipation)  Eye Contact Fair  Facial Expression Anxious  Affect Appropriate to circumstance  Speech Logical/coherent  Interaction Assertive  Motor Activity Other (Comment) (Unremarkable)  Appearance/Hygiene Unremarkable  Behavior Characteristics Cooperative  Mood Anxious  Aggressive Behavior  Effect No apparent injury  Thought Process  Coherency WDL  Content WDL  Delusions None reported or observed  Perception WDL  Hallucination None reported or observed  Judgment WDL  Confusion WDL  Danger to Self  Current suicidal ideation? Denies  Self-Injurious Behavior No self-injurious ideation or behavior indicators observed or expressed   Agreement Not to Harm Self Yes  Description of Agreement verbal  Danger to Others  Danger to Others None reported or observed

## 2021-09-11 NOTE — Progress Notes (Addendum)
[facility] MD Progress Note  09/11/2021 1:20 PM Denise Knight  MRN:  [id number]  Subjective:   [name] is a 55 y.o. female with reported PPHx of depression, bipolar, PTSD admitted to [facility] (09/02/2021) from [facility] due to worsening suicidal ideation and suicide attempt with Advil overdose.   Overnight Events: No acute events overnight, agitation PRNs or documented behavioral issues.  Patient states that she is doing well today, although she was not able to specify what is doing better. She continues to endorse having poor sleep, waking up x2 last night due to dreams, denies decrease in energy throughout the day. Her dream consisted of bags of money with people's faces on it. She admits to snoring, apneic spells where family has to wake her up, and having an OSA referral. However she has not been to appointment with a sleep study yet, although she is amenable. She also brought up right lower extremity swelling that is new. She denies SOB and orthopnea, however endorses asthma symptoms, requiring an inhaler. She was able to have a bowel movement this morning, and has no issues with her appetite.  Patient became teary when she reported that she is not a candidate for [employer] treatment center, however is hopeful for the [employer] today. Stating, "If I don't go to a treatment facility, I know what I would do and end up back here".  Patient denies present SI/HI/AVH, first rank symptoms, ideas of reference, thought broadcasting.  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)    Depression    Diabetes mellitus without complication (HCC)    pre-   GERD (gastroesophageal reflux disease)    Hypertension    MI (mitral incompetence)    Post traumatic stress disorder    Schizophrenia ([place])    Stomach ulcer    Substance abuse ([place])    cocaine quit july 2020   Suicide attempt [facility])     Past Surgical History:  Procedure Laterality Date   CARDIAC CATHETERIZATION  07/05/2016   CARDIAC CATHETERIZATION N/A 07/05/2016   Procedure: Left Heart Cath and Coronary Angiography;  Surgeon: Adrian Prows, MD;  Location: [place] [facility];  Service: Cardiovascular;  Laterality: N/A;   CYST EXCISION Right    "wrist"   DILATION AND CURETTAGE OF UTERUS     FOOT SURGERY Bilateral    "corns removed"   LEFT HEART CATH AND CORONARY ANGIOGRAPHY N/A 12/24/2017   Procedure: LEFT HEART CATH AND CORONARY ANGIOGRAPHY;  Surgeon: Nigel Mormon, MD;  Location: [place] [facility];  Service: Cardiovascular;  Laterality: N/A;   TUBAL LIGATION     Family History:  Family History  Problem Relation Age of Onset   Breast cancer Other    Lung cancer Other    Congestive Heart Failure Other    Diabetes Other    Hypertension Other    Colon cancer Neg  Hx    Colon polyps Neg Hx    Esophageal cancer Neg Hx    Stomach cancer Neg Hx    Rectal cancer Neg Hx    Family Psychiatric  History: seee H&P Social History:  Social History   Substance and Sexual Activity  Alcohol Use Yes   Alcohol/week: 7.0 standard drinks   Types: 7 Cans of beer per week     Social History   Substance and Sexual Activity  Drug Use Yes   Types: Marijuana    Social History   Socioeconomic History   Marital status: Widowed    Spouse name: Not on file   Number of children: 2   Years of education: Not on file   Highest education level: Not on file   Occupational History   Occupation: disabled   Tobacco Use   Smoking status: Some Days    Packs/day: 0.25    Years: 35.00    Pack years: 8.75    Types: Cigarettes    Last attempt to quit: 05/30/2019    Years since quitting: 2.2   Smokeless tobacco: Never  Vaping Use   Vaping Use: Never used  Substance and Sexual Activity   Alcohol use: Yes    Alcohol/week: 7.0 standard drinks    Types: 7 Cans of beer per week   Drug use: Yes    Types: Marijuana   Sexual activity: Yes    Birth control/protection: Surgical  Other Topics Concern   Not on file  Social History Narrative   Not on file   Social Determinants of Health   Financial Resource Strain: Not on file  Food Insecurity: Not on file  Transportation Needs: Not on file  Physical Activity: Not on file  Stress: Not on file  Social Connections: Not on file   Sleep: Fair  Appetite:  Good  Current Medications: Current Facility-Administered Medications  Medication Dose Route Frequency Provider Last Rate Last Admin   acetaminophen (TYLENOL) tablet 650 mg  650 mg Oral Q6H PRN Ajibola, Ene A, NP   650 mg at 09/10/21 0754   albuterol (VENTOLIN HFA) 108 (90 Base) MCG/ACT inhaler 1-2 puff  1-2 puff Inhalation Q6H PRN France Ravens, MD   2 puff at 09/10/21 1505   alum & mag hydroxide-simeth (MAALOX/MYLANTA) 200-200-20 MG/5ML suspension 30 mL  30 mL Oral Q4H PRN Ajibola, Ene A, NP       amLODipine (NORVASC) tablet 10 mg  10 mg Oral Daily Nelda Marseille, Amy E, MD   10 mg at 09/11/21 3086   B-complex with vitamin C tablet 1 tablet  1 tablet Oral Daily Maida Sale, MD   1 tablet at 09/11/21 5784   cloNIDine (CATAPRES) tablet 0.1 mg  0.1 mg Oral Aliene Altes, MD   0.1 mg at 09/10/21 2114   diclofenac Sodium (VOLTAREN) 1 % topical gel 2 g  2 g Topical BID PRN France Ravens, MD   2 g at 09/09/21 1333   hydrOXYzine (ATARAX/VISTARIL) tablet 25 mg  25 mg Oral TID PRN Ajibola, Ene A, NP   25 mg at 09/11/21 0835   lamoTRIgine (LAMICTAL) tablet  25 mg  25 mg Oral Daily Nelda Marseille, Amy E, MD   25 mg at 09/11/21 6962   magnesium hydroxide (MILK OF MAGNESIA) suspension 15 mL  15 mL Oral Daily PRN France Ravens, MD   15 mL at 09/10/21 2110   melatonin tablet 3 mg  3 mg Oral QHS Merrily Brittle, DO  mirtazapine (REMERON) tablet 15 mg  15 mg Oral QHS France Ravens, MD   15 mg at 09/10/21 2111   nystatin (MYCOSTATIN/NYSTOP) topical powder   Topical BID Derrill Center, NP   1 Units at 09/10/21 0754   pantoprazole (PROTONIX) EC tablet 40 mg  40 mg Oral QAC breakfast Bobbitt, Shalon E, NP   40 mg at 09/11/21 0639   polyethylene glycol (MIRALAX / GLYCOLAX) packet 17 g  17 g Oral Daily PRN France Ravens, MD   17 g at 09/10/21 1819   risperiDONE (RISPERDAL) tablet 2 mg  2 mg Oral QHS Nelda Marseille, Amy E, MD   2 mg at 09/10/21 2111   simvastatin (ZOCOR) tablet 40 mg  40 mg Oral q1800 Harlow Asa, MD   40 mg at 09/10/21 1816   Vitamin D (Ergocalciferol) (DRISDOL) capsule 50,000 Units  50,000 Units Oral Q7 days Maida Sale, MD   50,000 Units at 09/11/21 8916   Lab Results:  No results found for this or any previous visit (from the past 48 hour(s)).  Blood Alcohol level:  Lab Results  Component Value Date   ETH <10 09/02/2021   ETH 33 (H) 94/50/3888   Metabolic Disorder Labs: Lab Results  Component Value Date   HGBA1C 5.7 (H) 09/03/2021   MPG 116.89 09/03/2021   MPG 116.89 01/13/2020   No results found for: PROLACTIN Lab Results  Component Value Date   CHOL 230 (H) 09/03/2021   TRIG 111 09/03/2021   HDL 62 09/03/2021   CHOLHDL 3.7 09/03/2021   VLDL 22 09/03/2021   LDLCALC 146 (H) 09/03/2021   LDLCALC 114 (H) 01/13/2020   Physical Findings: Musculoskeletal: Strength & Muscle Tone: within normal limits Gait & Station: normal Patient leans: N/A  Psychiatric Specialty Exam:  Presentation  General Appearance: Casually dressed, adequate hygiene  Eye Contact:Fair  Speech:Clear and Coherent; Normal Rate  Speech  Volume:Normal   Handedness:Right  Mood and Affect  Mood: Depressed, labile - becomes teary when talking about placement, joking with author right after  Affect: Congruent  Thought Process  Thought Processes:Coherent; Goal Directed; Linear  Descriptions of Associations:Intact  Orientation:Full (Time, Place and Person)  Thought Content: Denies AVH, ideas of reference, first rank symptoms or paranoia and is not grossly responding to internal/external stimuli on exam. Perseverate  History of Schizophrenia/Schizoaffective disorder:No  Duration of Psychotic Symptoms:No data recorded Hallucinations: Denied   Ideas of Reference:None  Suicidal Thoughts: Denied   Homicidal Thoughts: Denied   Sensorium  Memory:Immediate Good; Recent Good; Remote Fair  Judgment:Fair  Insight:Fair   Executive Functions  Concentration:Good  Attention Span:Good  [place] of Knowledge:Good  Language:Good   Psychomotor Activity  Psychomotor Activity:normal no akathisias or tremors noted   Assets  Assets:Communication Skills; Desire for Improvement; Resilience   Sleep  Patient slept Number of Hours: 6.75    Physical Exam Vitals and nursing note reviewed.  Constitutional:      General: She is not in acute distress.    Appearance: Normal appearance. She is not ill-appearing or toxic-appearing.  HENT:     Head: Normocephalic and atraumatic.  Pulmonary:     Effort: Pulmonary effort is normal.  Neurological:     General: No focal deficit present.     Mental Status: She is oriented to person, place, and time.   Review of Systems  Respiratory:  Negative for shortness of breath.   Cardiovascular:  Negative for chest pain.  Gastrointestinal:  Negative for abdominal pain, constipation, diarrhea,  heartburn, nausea and vomiting.  Neurological:  Negative for headaches.  Blood pressure 116/85, pulse 94, temperature 98 F (36.7 C), temperature source Oral, resp. rate 18,  height 5' 8.5" (1.74 m), weight 88.5 kg, SpO2 99 %. Body mass index is 29.22 kg/m.   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 is a 55 y.o. female with reported PPHx of depression, bipolar, PTSD who was admitted to [facility] (09[facility]02/[facility] for suicidal ideation, suicide attempt with Advil overdose and smoking crack.  Patient endorsed improved mood and denies SI or signs of psychosis. Currently, her main concern are nightmares interrupting her sleep, that seems unimproved with recent prazosin now changed to clonidine.  Per above, her symptoms are concerning for OSA, which may contribute to her worsening mood and interrupted sleep. Otherwise, currently pending rehab facility placement. Felt Day 9    PSYCHIATRIC DIAGNOSIS & TREATMENT:  #Bipolar Disorder-Type II, depressive episode severe with psychotic features (r/o MDD -severe, r/o substance induced mood d/o) Continue Lamictal 25 mg daily for mood stability and bipolar disorder titrating up over time Continue Risperdal 2 mg qhs for psychotic symptoms and mood stabilization  #Polysubstance use disorder (cocaine, EtOH, cannabis) No acute signs of withdrawal at this time. Counseled on need to abstain from substances and she is interested in residential rehab after discharge. SW assisting with rehab referrals. Pending rehab facility placement  #Insomnia Possible OSA She endorsed multiple awakenings at night due to dreams, snoring, past apneic spells witnessed by family.  She states that other providers have brought up the concern with her. We will have her  follow up with PCP for sleep study and possible CPAP. She was recently switched to clonidine from prazosin, however neither seems to have made a difference.  Also her "nightmares" seem to only bother her because they keep her awake, not the content of the dream itself.  Continue Remeron 15 nightly for PTSD, depression, and sleep Decrease Melatonin 10 mg to 43m for sleep Continue Clonidine 0.1 mg qhs for nightmares, will continue to monitor blood pressure and orthostatics    MEDICAL MANAGEMENT: Vertigo No complaints or concerns regarding dizziness today. No dizziness, nausea, gait instability with swelling noted when walking back to her room. We will continue to monitor and recommend PCP follow-up for vertigo workup.   HTN Continue Norvasc 186mdaily  Hypercholesteremia Hyperlipidemia Cholesterol 230 and LDL 146. Continue Zocor 4072maily  Chest Pain with Prolonged QTC prior to admission Likely musculoskeletal in nature. Most recent QTC 452. No other complaints of CP thus far, will have patient follow up with PCP.  Vitamin D Deficiency Cholecalciferol 50000 units every week    Safety and Monitoring: Voluntary admission to inpatient psychiatric unit for safety, stabilization and treatment Daily contact with patient to assess and evaluate symptoms and progress in treatment Patient's case to be discussed in multi-disciplinary team meeting Observation Level : q15 minute checks Vital signs: q12 hours Precautions: suicide, elopement, and assault   Discharge Planning: Social work and case management to assist with discharge planning and identification of hospital follow-up needs prior to discharge Estimated LOS: 7-10 days Discharge Concerns: Need to  establish a safety plan; Medication compliance and effectiveness Discharge Goals: Return home with outpatient referrals for mental health follow-up including medication management/psychotherapy  Merrily Brittle, DO Psychiatry Resident,  PGY-1 [facility] 09/11/2021, 1:20 PM

## 2021-09-11 NOTE — BHH Counselor (Signed)
CSW contacted [facility] who stated that they will not be accepting the Pt due to her recent suicidal thoughts and attempt.  They state that her insurance was accepted but that due to her mental health they will not be accepting her.  The Pt has a phone interview with [facility] in [place] today at 1:00pm.  There are currently no beds available at [facility] but the Pt will be added to a waiting list.  The Pt has been scheduled for SAIOP until a bed becomes available.  The Pt can return to her home with her family and complete outpatient treatment until a bed is available at the outpatient facility. The Pt may also access the [facility] and all walk-in services at the [facility] for additional substance use resources.

## 2021-09-11 NOTE — BHH Group Notes (Signed)
Pt did not attend goals group.

## 2021-09-11 NOTE — BHH Group Notes (Signed)
[facility] Group Notes:  (Nursing/MHT/Case Management/Adjunct)  Date:  09/11/2021  Time:  8:43 PM  Type of Therapy:  Psychoeducational Skills  Participation Level:  Active  Participation Quality:  Appropriate  Affect:  Appropriate  Cognitive:  Appropriate  Insight:  Improving  Engagement in Group:  Developing/Improving  Modes of Intervention:  Education  Summary of Progress/Problems:The patient's positive event for the day is that she talked to the staff about going to a rehab facility.  [name] S 09/11/2021, 8:43 PM

## 2021-09-12 ENCOUNTER — Encounter (HOSPITAL_COMMUNITY): Payer: Self-pay

## 2021-09-12 MED ORDER — FLUTICASONE PROPIONATE HFA 44 MCG/ACT IN AERO: 2.0000 | INHALATION_SPRAY | Freq: Two times a day (BID) | RESPIRATORY_TRACT | Status: DC

## 2021-09-12 MED ORDER — BUDESONIDE 0.25 MG/2ML IN SUSP
0.2500 mg | Freq: Two times a day (BID) | RESPIRATORY_TRACT | Status: DC
  Administered 2021-09-12: 0.25 mg via RESPIRATORY_TRACT
  Filled 2021-09-12 (×6): qty 2

## 2021-09-12 NOTE — Progress Notes (Signed)
Pt A & O X4. Presents animated with logical speech, fair eye contact, intrusive on interactions and is ambulatory in milieu with a steady gait. Rates her depression and hopelessness both 0/10, anxiety 8/10 on self inventory sheet with current stressor being "Getting into [employer]. I just want to get into a rehab facility". Continues to report poor sleep related to nightmares "I keep getting nightmares for like a week now. Last night I dreamt about all dead people including my mother" but was animated while talking about it. Reports appetite is good with normal energy and good concentration level. Pt denies SI, HI, AVH and pain at this time. Attends scheduled groups. Denies discomfort with current medication regimen and is tolerating all PO intake well. Pt received PRN Vistaril 25 mg PO at 0818 for her anxiety. Reports relief when reassessed at 0910.  Support and reassurance provided to pt. Q 15 minutes safety checks remains effective without self harm gestures. All medications given with verbal education and effects monitored. Pt encouraged to voice concerns.  Safety maintained on and off unit. Pt denies concerns at this time.

## 2021-09-12 NOTE — Progress Notes (Signed)
   09/12/21 2120  Psych Admission Type (Psych Patients Only)  Admission Status Voluntary  Psychosocial Assessment  Patient Complaints Anxiety  Eye Contact Fair  Facial Expression Animated  Affect Appropriate to circumstance  Speech Logical/coherent  Interaction Assertive;Needy  Motor Activity Other (Comment) (WDL)  Appearance/Hygiene Unremarkable  Behavior Characteristics Cooperative;Appropriate to situation  Mood Pleasant  Thought Process  Coherency WDL  Content WDL  Delusions None reported or observed  Perception WDL  Hallucination None reported or observed  Judgment WDL  Confusion None  Danger to Self  Current suicidal ideation? Denies  Self-Injurious Behavior No self-injurious ideation or behavior indicators observed or expressed   Agreement Not to Harm Self Yes  Description of Agreement Verbal contract  Danger to Others  Danger to Others None reported or observed

## 2021-09-12 NOTE — Progress Notes (Addendum)
[facility] MD Progress Note  09/12/2021 5:55 AM Denise Knight  MRN:  [id number]  Subjective:   [name] is a 55 y.o. female with reported PPHx of depression, bipolar, PTSD admitted to [facility] (09/02/2021) from [facility] due to worsening suicidal ideation and suicide attempt with Advil overdose.   Overnight Events: No acute events overnight, no agitation PRNs nor documented behavioral issues. Patient slept Number of Hours: 6.75   Patient was evaluated this AM, she was initially seen in the day room. She stated that she has been feeling good and hopeful today, specifically about the interview with [facility].  Discussed with her, however if they were to say no, how she will react.  She said she would lean on her support system, her daughter and her brother, and that she would be ok at the end.  She says she still having nightmares, and waking up multiple times a night, which concerns her more than the nightmare itself.  Stated that today she was feeling about dead people.  Continued to endorse a good appetite.  Discussed disposition with her, said that currently she feels like she is ready to go home, however is concerned that she will relapse without inpatient rehab treatment.  Stated that she was using substances to help her with anxiety, especially about the wellbeing of her family. She also brought up concerns of right lower extremity swelling, that happens after dinner and eating salty food. She is uncertain, but stated that someone in her past as mentioned CHF, but has not started her on any medicines. She endorses DOE that is relieved with albuterol. Discussed with her that she needs to see her PCP SOB and pitting edema, concerning for uncontrolled asthma vs possible CHF.  She stated that she understood, and has no issues following up with her PCP and any referrals afterwards.  Stated that she is happy with her medical regiment currently, stated that she feels like  she will be 100% compliant, but will let us know if she perceives any future difficulties.  Patient denies present SI/HI/AVH, delusions, ideas of reference, first rank symptoms.  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 ([place]) Active Problems:   Cocaine abuse ([place])   Alcohol use disorder, moderate, dependence ([place])   Cannabis dependence with current use ([place])   Vitamin D deficiency  Total Time spent with patient: 25 minutes  Past Psychiatric History: see H&P  Past Medical History:  Past Medical History:  Diagnosis Date   Allergy    seasonal   Anxiety    Arthritis    "left knee" (07/05/2016)   Asthma    Bipolar 1 disorder ([place])    Depression    Diabetes mellitus without complication (HCC)    pre-   GERD (gastroesophageal reflux disease)    Hypertension    MI (mitral incompetence)    Post traumatic stress disorder    Schizophrenia ([place])    Stomach ulcer    Substance abuse ([place])    cocaine quit july 2020   Suicide attempt [facility])     Past Surgical History:  Procedure Laterality Date   CARDIAC CATHETERIZATION  07/05/2016   CARDIAC CATHETERIZATION N/A 07/05/2016   Procedure: Left Heart Cath and Coronary Angiography;  Surgeon: Adrian Prows, MD;  Location: [place] [facility];  Service: Cardiovascular;  Laterality: N/A;   CYST EXCISION Right    "  wrist"   DILATION AND CURETTAGE OF UTERUS     FOOT SURGERY Bilateral    "corns removed"   LEFT HEART CATH AND CORONARY ANGIOGRAPHY N/A 12/24/2017   Procedure: LEFT HEART CATH AND CORONARY ANGIOGRAPHY;  Surgeon: Nigel Mormon, MD;  Location: [place] [facility];  Service: Cardiovascular;  Laterality: N/A;   TUBAL LIGATION     Family History:  Family History  Problem Relation Age of Onset   Breast cancer Other    Lung cancer Other    Congestive Heart Failure Other    Diabetes Other     Hypertension Other    Colon cancer Neg Hx    Colon polyps Neg Hx    Esophageal cancer Neg Hx    Stomach cancer Neg Hx    Rectal cancer Neg Hx    Family Psychiatric  History: seee H&P Social History:  Social History   Substance and Sexual Activity  Alcohol Use Yes   Alcohol/week: 7.0 standard drinks   Types: 7 Cans of beer per week     Social History   Substance and Sexual Activity  Drug Use Yes   Types: Marijuana    Social History   Socioeconomic History   Marital status: Widowed    Spouse name: Not on file   Number of children: 2   Years of education: Not on file   Highest education level: Not on file  Occupational History   Occupation: disabled   Tobacco Use   Smoking status: Some Days    Packs/day: 0.25    Years: 35.00    Pack years: 8.75    Types: Cigarettes    Last attempt to quit: 05/30/2019    Years since quitting: 2.2   Smokeless tobacco: Never  Vaping Use   Vaping Use: Never used  Substance and Sexual Activity   Alcohol use: Yes    Alcohol/week: 7.0 standard drinks    Types: 7 Cans of beer per week   Drug use: Yes    Types: Marijuana   Sexual activity: Yes    Birth control/protection: Surgical  Other Topics Concern   Not on file  Social History Narrative   Not on file   Social Determinants of Health   Financial Resource Strain: Not on file  Food Insecurity: Not on file  Transportation Needs: Not on file  Physical Activity: Not on file  Stress: Not on file  Social Connections: Not on file   Sleep: Good  Appetite:  Good  Current Medications: Current Facility-Administered Medications  Medication Dose Route Frequency Provider Last Rate Last Admin   acetaminophen (TYLENOL) tablet 650 mg  650 mg Oral Q6H PRN Ajibola, Ene A, NP   650 mg at 09/10/21 0754   albuterol (VENTOLIN HFA) 108 (90 Base) MCG/ACT inhaler 1-2 puff  1-2 puff Inhalation Q6H PRN France Ravens, MD   2 puff at 09/11/21 1558   alum & mag hydroxide-simeth (MAALOX/MYLANTA)  200-200-20 MG/5ML suspension 30 mL  30 mL Oral Q4H PRN Ajibola, Ene A, NP       amLODipine (NORVASC) tablet 10 mg  10 mg Oral Daily Nelda Marseille, Amy E, MD   10 mg at 09/11/21 2542   B-complex with vitamin C tablet 1 tablet  1 tablet Oral Daily Maida Sale, MD   1 tablet at 09/11/21 7062   cloNIDine (CATAPRES) tablet 0.1 mg  0.1 mg Oral QHS France Ravens, MD   0.1 mg at 09/11/21 2110   diclofenac Sodium (VOLTAREN) 1 % topical  gel 2 g  2 g Topical BID PRN France Ravens, MD   2 g at 09/09/21 1333   hydrOXYzine (ATARAX/VISTARIL) tablet 25 mg  25 mg Oral TID PRN Ajibola, Ene A, NP   25 mg at 09/11/21 2110   lamoTRIgine (LAMICTAL) tablet 25 mg  25 mg Oral Daily Nelda Marseille, Amy E, MD   25 mg at 09/11/21 2751   magnesium hydroxide (MILK OF MAGNESIA) suspension 15 mL  15 mL Oral Daily PRN France Ravens, MD   15 mL at 09/11/21 1817   melatonin tablet 3 mg  3 mg Oral QHS Merrily Brittle, DO   3 mg at 09/11/21 2058   mirtazapine (REMERON) tablet 15 mg  15 mg Oral Aliene Altes, MD   15 mg at 09/11/21 2110   nystatin (MYCOSTATIN/NYSTOP) topical powder   Topical BID PRN Merrily Brittle, DO       pantoprazole (PROTONIX) EC tablet 40 mg  40 mg Oral QAC breakfast Bobbitt, Shalon E, NP   40 mg at 09/11/21 0639   polyethylene glycol (MIRALAX / GLYCOLAX) packet 17 g  17 g Oral Daily PRN France Ravens, MD   17 g at 09/10/21 1819   risperiDONE (RISPERDAL) tablet 2 mg  2 mg Oral QHS Nelda Marseille, Amy E, MD   2 mg at 09/11/21 2110   simvastatin (ZOCOR) tablet 40 mg  40 mg Oral q1800 Harlow Asa, MD   40 mg at 09/11/21 1815   Vitamin D (Ergocalciferol) (DRISDOL) capsule 50,000 Units  50,000 Units Oral Q7 days Maida Sale, MD   50,000 Units at 09/11/21 7001   Lab Results:  No results found for this or any previous visit (from the past 81 hour(s)).  Blood Alcohol level:  Lab Results  Component Value Date   ETH <10 09/02/2021   ETH 33 (H) 74/94/4967   Metabolic Disorder Labs: Lab Results  Component Value Date    HGBA1C 5.7 (H) 09/03/2021   MPG 116.89 09/03/2021   MPG 116.89 01/13/2020   No results found for: PROLACTIN Lab Results  Component Value Date   CHOL 230 (H) 09/03/2021   TRIG 111 09/03/2021   HDL 62 09/03/2021   CHOLHDL 3.7 09/03/2021   VLDL 22 09/03/2021   LDLCALC 146 (H) 09/03/2021   LDLCALC 114 (H) 01/13/2020   Physical Findings: Musculoskeletal: Strength & Muscle Tone: within normal limits Gait & Station: normal Patient leans: N/A  Psychiatric Specialty Exam:  Presentation  General Appearance: Casually dressed, adequate hygiene  Eye Contact: Good  Speech:Clear and Coherent; Normal Rate  Speech Volume:Normal   Handedness:Right  Mood and Affect  Mood: Hopeful, euthymic, became teary when talking about her daughter, overall much less labile than the day prior  Affect: Congruent  Thought Process  Thought Processes:Coherent; Goal Directed; Linear  Descriptions of Associations:Intact  Orientation:Full (Time, Place and Person)  Thought Content: Denies SI/HI/AVH, first rank symptoms, delusions or paranoia and is not grossly responding to internal/external stimuli on exam  History of Schizophrenia/Schizoaffective disorder:No  Duration of Psychotic Symptoms:No data recorded Hallucinations: Denied   Ideas of Reference:None  Suicidal Thoughts: Denied   Homicidal Thoughts: Denied   Sensorium  Memory:Immediate Good; Recent Good; Remote Fair  Judgment:Fair  Insight:Fair   Executive Functions  Concentration:Good  Attention Span:Good  [place] of Knowledge:Good  Language:Good   Psychomotor Activity  Psychomotor Activity: normal no akathisias or tremors noted, steady gait, no tics noted   Assets  Assets:Communication Skills; Desire for Improvement; Resilience   Sleep  Patient slept Number of Hours: 6.75    Physical Exam Vitals and nursing note reviewed.  Constitutional:      General: She is not in acute distress.     Appearance: Normal appearance. She is not ill-appearing or toxic-appearing.  HENT:     Head: Normocephalic and atraumatic.  Pulmonary:     Effort: Pulmonary effort is normal.  Neurological:     General: No focal deficit present.     Mental Status: She is oriented to person, place, and time.   Review of Systems  Respiratory:  Negative for shortness of breath.   Cardiovascular:  Negative for chest pain.  Gastrointestinal:  Negative for abdominal pain, constipation, diarrhea, heartburn, nausea and vomiting.  Neurological:  Negative for headaches.  Blood pressure 112/88, pulse (!) 102, temperature 98 F (36.7 C), temperature source Oral, resp. rate 18, height 5' 8.5" (1.74 m), weight 88.5 kg, SpO2 99 %. Body mass index is 29.22 kg/m.   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 is a 55 y.o. female with reported PPHx of depression, bipolar, PTSD who was admitted to [facility] (09/02/2021) from [facility] for suicidal ideation, suicide attempt with Advil overdose and smoking crack.  Patient endorsed improved mood and denies SI or signs of psychosis. Currently, her main concern are nightmares interrupting her sleep, that seems unimproved with recent prazosin now changed to clonidine.  Per above, her symptoms are concerning for OSA, which may contribute to her worsening mood and interrupted sleep. Otherwise, currently pending rehab facility placement. [place] [facility]   Plan: #Bipolar Disorder-Type II, depressive episode severe with psychotic features (r/o MDD -severe, r/o substance induced mood d/o) She presents with mood  lability, does improved with regimen see below.  Will have her outpatient provider up titrate her Lamictal to improve mood lability and depression. Continue Lamictal 25 mg daily for mood stability and bipolar disorder titrating up over time Continue Risperdal 2 mg qhs for psychotic symptoms and mood stabilization Continue Topamax 50 mg twice daily  #Polysubstance use disorder (cocaine, EtOH, cannabis) No acute signs of withdrawal at this time. Counseled on need to abstain from substances and she is interested in residential rehab after discharge. SW assisting with rehab referrals. Pending rehab facility placement  #Insomnia Possible OSA She endorsed multiple awakenings at night due to dreams, snoring, past apneic spells witnessed by family.  She states that other providers have brought up the concern with her. We will have her follow up with PCP for sleep study and possible CPAP. Also her "nightmares" seem to only bother her because they keep her awake, not the content of the dream itself.  She continues to have nightmares despite clonidine, this morning her blood pressure were lower, but asymptomatic.  We will discontinue clonidine as it is ineffective, as assessed for possible orthostatic. Continue Remeron 15 nightly for PTSD, depression, and sleep Continue melatonin 10 mg to 59m for sleep Discontinue clonidine 0.1 mg qhs for nightmares    MEDICAL MANAGEMENT: Vertigo No complaints or concerns regarding dizziness today. No dizziness, nausea, gait instability with swelling noted when walking back to her room. We will continue to  monitor and recommend PCP follow-up for vertigo workup.  Suspect patient does not really have vertigo.  HTN She endorsed medical noncompliance, concern for progression towards CHF, we will have patient follow up with PCP for work-up.  Complained of unilateral lower extremity swelling, with pitting, that is worse after salty meal.  She does complain of DOE, however  contributes it to her asthma.  Stated that the albuterol dose really the SOB. Continue Norvasc 63m daily  Asthma Reported history of asthma, uses albuterol inhaler almost nightly.  Concerned that her DOE is secondary to possible CHF rather than asthma.  We will have patient follow up with her PCP, for either reevaluation or starting ICS.  Hypercholesteremia Hyperlipidemia Cholesterol 230 and LDL 146.  We will have her PCP repeat lipid panel. Continue Zocor 441mdaily  Chest Pain with Prolonged QTC prior to admission Reproducible, therefore likely musculoskeletal in nature. Most recent QTC 452. No other complaints of CP thus far, will have patient follow up with PCP.   Vitamin D Deficiency Cholecalciferol 50000 units every week    Safety and Monitoring: Voluntary admission to inpatient psychiatric unit for safety, stabilization and treatment Daily contact with patient to assess and evaluate symptoms and progress in treatment Patient's case to be discussed in multi-disciplinary team meeting Observation Level : q15 minute checks Vital signs: q12 hours Precautions: suicide, elopement, and assault   Discharge Planning: Social work and case management to assist with discharge planning and identification of hospital follow-up needs prior to discharge Estimated LOS: 7-10 days Discharge Concerns: Need to establish a safety plan; Medication compliance and effectiveness Discharge Goals: Return home with outpatient referrals for mental health follow-up including medication management/psychotherapy  JuMerrily BrittleDO Psychiatry Resident, PGY-1 [facility]1/12/2020, 5:55 AM

## 2021-09-12 NOTE — Group Note (Signed)
Recreation Therapy Group Note   Group Topic:Stress Management  Group Date: 09/12/2021 Start Time: 0930 End Time: 0950 Facilitators: Victorino Sparrow, LRT/CTRS Location: [address] Dayroom   Goal Area(s) Addresses:  Patient will actively participate in stress management techniques presented during session.  Patient will successfully identify benefit of practicing stress management post d/c.     Group Discussion: Guided Imagery. LRT provided education, instruction, and demonstration on practice of visualization via guided imagery. Patient was asked to participate in the technique introduced during session.  Patients were given suggestions of ways to access scripts post d/c and encouraged to explore Youtube and other apps available on smartphones, tablets, and computers.   Affect/Mood: Appropriate   Participation Level: Engaged   Participation Quality: Independent   Behavior: Appropriate   Speech/Thought Process: Focused   Insight: Good   Judgement: Good   Modes of Intervention: Soft Ocean Sounds; Script   Patient Response to Interventions:  Engaged   Education Outcome:  Acknowledges education and In group clarification offered    Clinical Observations/Individualized Feedback: Pt attended and participated in group session.     Plan: Continue to engage patient in RT group sessions 2-3x/week.   Victorino Sparrow, LRT/CTRS 09/12/2021 11:31 AM

## 2021-09-12 NOTE — BHH Group Notes (Signed)
Patient participated and contributed to goals group

## 2021-09-12 NOTE — BHH Counselor (Signed)
CSW attempted to contact [facility] to see if the Pt was admitted to the facility.  There was no answer.  CSW left a HIPAA approved voicemail for [employer] (Case Worker) and on the primary admission voicemail.  The Pt has also been attempting to contact the facility as well to ask about acceptance to the facility.

## 2021-09-12 NOTE — BHH Group Notes (Signed)
[facility] Group Notes:  (Nursing/MHT/Case Management/Adjunct)  Date:  09/12/2021  Time:  8:10 PM  Type of Therapy:   na  Participation Level:  Active  Participation Quality:  Attentive  Affect:  Appropriate  Cognitive:  Appropriate  Insight:  Appropriate, Good, and Improving  Engagement in Group:  Developing/Improving  Modes of Intervention:  Discussion  Summary of Progress/Problems:  Denise Knight 09/12/2021, 8:10 PM

## 2021-09-12 NOTE — BHH Counselor (Addendum)
CSW provided the Pt with a form that she can complete to get transportation in [facility].  The Pt will complete this form and CSW will fax it back to the [place] transit authority.  This transportation is designed for medical and any other transportation needs for [facility] residents who do not have Medicaid and are younger then 57 years old.

## 2021-09-12 NOTE — Progress Notes (Signed)
   09/11/21 2100  Psych Admission Type (Psych Patients Only)  Admission Status Voluntary  Psychosocial Assessment  Patient Complaints Anxiety  Eye Contact Fair  Facial Expression Animated  Affect Appropriate to circumstance  Speech Logical/coherent  Interaction Assertive  Motor Activity Other (Comment) (Unremarkable)  Appearance/Hygiene Unremarkable  Behavior Characteristics Cooperative  Mood Euthymic;Pleasant  Aggressive Behavior  Effect No apparent injury  Thought Process  Coherency WDL  Content WDL  Delusions None reported or observed  Perception WDL  Hallucination None reported or observed  Judgment WDL  Confusion WDL  Danger to Self  Current suicidal ideation? Denies  Self-Injurious Behavior No self-injurious ideation or behavior indicators observed or expressed   Agreement Not to Harm Self Yes  Description of Agreement verbal  Danger to Others  Danger to Others None reported or observed

## 2021-09-12 NOTE — Group Note (Addendum)
[place] LCSW Group Therapy Note   Group Date: 09/12/2021 Start Time: 1300 End Time: 1345   Type of Therapy/Topic:  Group Therapy:  Emotion Regulation  Participation Level:  Minimal    Description of Group:    The purpose of this group is to assist patients in learning to regulate negative emotions and experience positive emotions. Patients will be guided to discuss ways in which they have been vulnerable to their negative emotions. These vulnerabilities will be juxtaposed with experiences of positive emotions or situations, and patients challenged to use positive emotions to combat negative ones. Special emphasis will be placed on coping with negative emotions in conflict situations, and patients will process healthy conflict resolution skills.  Therapeutic Goals: Patient will identify two positive emotions or experiences to reflect on in order to balance out negative emotions:  Patient will label two or more emotions that they find the most difficult to experience:  Patient will be able to demonstrate positive conflict resolution skills through discussion or role plays:   Summary of Patient Progress: Pt came to group late and did not participate in discussion.   Therapeutic Modalities:   Cognitive Behavioral Therapy Feelings Identification Dialectical Behavioral Therapy   Mliss Fritz, LCSWA

## 2021-09-12 NOTE — BH IP Treatment Plan (Signed)
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 ([place]) Active Problems:   Cocaine abuse ([place])   Alcohol use disorder, moderate, dependence ([place])   Cannabis dependence with current use ([place])   Vitamin D deficiency   Current Medications:  Current Facility-Administered Medications  Medication Dose Route Frequency Provider Last Rate Last Admin   acetaminophen (TYLENOL) tablet 650 mg  650 mg Oral Q6H PRN Ajibola, Ene A, NP   650 mg at 09/10/21 0754   albuterol (VENTOLIN HFA) 108 (90 Base) MCG/ACT inhaler 1-2 puff  1-2 puff Inhalation Q6H PRN France Ravens, MD   2 puff at 09/11/21 1558   alum & mag hydroxide-simeth (MAALOX/MYLANTA) 200-200-20 MG/5ML suspension 30 mL  30 mL Oral Q4H PRN Ajibola, Ene A, NP       amLODipine (NORVASC) tablet 10 mg  10 mg Oral Daily Nelda Marseille, Amy E, MD   10 mg at 09/12/21 0865   B-complex with vitamin C tablet 1 tablet  1 tablet Oral Daily Maida Sale, MD   1 tablet at 09/12/21 0816   diclofenac Sodium (VOLTAREN) 1 % topical gel 2 g  2 g Topical BID PRN France Ravens, MD   2 g at 09/09/21 1333   hydrOXYzine (ATARAX/VISTARIL) tablet 25 mg  25 mg Oral TID PRN Ajibola, Ene A, NP   25 mg at 09/12/21 0818   lamoTRIgine (LAMICTAL) tablet 25 mg  25 mg Oral Daily Nelda Marseille, Amy E, MD   25 mg at 09/12/21 0817   magnesium hydroxide (MILK OF MAGNESIA) suspension 15 mL  15 mL Oral Daily PRN France Ravens, MD   15 mL at 09/11/21 1817   melatonin tablet 3 mg  3 mg Oral QHS Merrily Brittle, DO   3 mg at 09/11/21 2058   mirtazapine (REMERON) tablet 15 mg  15 mg Oral Aliene Altes, MD   15 mg at 09/11/21 2110   nystatin (MYCOSTATIN/NYSTOP) topical powder   Topical BID PRN Merrily Brittle, DO       pantoprazole  (PROTONIX) EC tablet 40 mg  40 mg Oral QAC breakfast Bobbitt, Shalon E, NP   40 mg at 09/12/21 7846   polyethylene glycol (MIRALAX / GLYCOLAX) packet 17 g  17 g Oral Daily PRN France Ravens, MD   17 g at 09/12/21 0905   risperiDONE (RISPERDAL) tablet 2 mg  2 mg Oral QHS Nelda Marseille, Amy E, MD   2 mg at 09/11/21 2110   simvastatin (ZOCOR) tablet 40 mg  40 mg Oral q1800 Harlow Asa, MD   40 mg at 09/11/21 1815   Vitamin D (Ergocalciferol) (DRISDOL) capsule 50,000 Units  50,000 Units Oral Q7 days Maida Sale, MD   50,000 Units at 09/11/21 9629   PTA Medications: Medications Prior to Admission  Medication Sig Dispense Refill Last Dose   meclizine (ANTIVERT) 25 MG tablet Take 25 mg by mouth 3 (three) times daily as needed for dizziness.      albuterol (VENTOLIN HFA) 108 (90 Base) MCG/ACT inhaler Inhale 1-2 puffs into the lungs every 6 (six) hours as needed for wheezing or shortness of breath. 1 each 0    amLODipine (NORVASC) 10 MG tablet Take 10 mg by mouth daily.      cetirizine (ZYRTEC) 10 MG tablet Take 1 tablet (10  mg total) by mouth daily. 30 tablet 0    simvastatin (ZOCOR) 40 MG tablet Take 40 mg by mouth daily.       Patient Stressors: Financial difficulties   Health problems   Marital or family conflict    Patient Strengths:    Treatment Modalities: Medication Management, Group therapy, Case management,  1 to 1 session with clinician, Psychoeducation, Recreational therapy.   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 ([place]) Long Term Goal(s): Improvement in symptoms so as ready for discharge   Short Term Goals: Ability to identify changes in lifestyle to reduce recurrence of condition will improve Ability to verbalize feelings will improve Ability to disclose and discuss suicidal ideas Ability to demonstrate self-control will improve Ability to identify and develop effective coping behaviors will  improve Ability to maintain clinical measurements within normal limits will improve Compliance with prescribed medications will improve Ability to identify triggers associated with substance abuse/mental health issues will improve  Medication Management: Evaluate patient's response, side effects, and tolerance of medication regimen.  Therapeutic Interventions: 1 to 1 sessions, Unit Group sessions and Medication administration.  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 ([place]) Active Problems:   Cocaine abuse ([facility])   Alcohol use disorder, moderate, dependence ([place])   Cannabis dependence with current use ([place])   Vitamin D deficiency  Long Term Goal(s): Improvement in symptoms so as ready for discharge   Short Term Goals: Ability to identify changes in lifestyle to reduce recurrence of condition will improve Ability to verbalize feelings will improve Ability to disclose and discuss suicidal ideas Ability to demonstrate self-control will improve Ability to identify and develop effective coping behaviors will improve Ability to maintain clinical measurements within normal limits will improve Compliance with prescribed medications will improve Ability to identify triggers associated with substance abuse/mental health issues will improve     Medication Management: Evaluate patient's response, side effects, and tolerance of medication regimen.  Therapeutic Interventions: 1 to 1 sessions, Unit Group sessions and Medication administration.  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 RN will report any adverse and/or side effects to prescribing provider.  Therapeutic Interventions: 1 on 1 counseling sessions, Psychoeducation, Medication administration, Evaluate responses to treatment, Monitor vital signs and CBGs as ordered, Perform/monitor CIWA, COWS, AIMS and Fall Risk screenings as ordered, Perform wound care treatments as ordered.  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  Short Term Goals: Engage patient in aftercare planning with referrals and resources, Increase social support, Increase emotional regulation, Facilitate acceptance of mental health diagnosis and concerns, Identify triggers associated with mental health/substance abuse issues,  and Increase skills for wellness and recovery  Therapeutic Interventions: Assess for all discharge needs, 1 to 1 time with Social worker, Explore available resources and support systems, Assess for adequacy in community support network, Educate family and significant other(s) on suicide prevention, Complete Psychosocial Assessment, Interpersonal group therapy.  Evaluation of Outcomes: Progressing   Progress in Treatment: Attending groups: Yes. Participating in groups: Yes. Taking medication as prescribed: Yes. Toleration medication: Yes. Family/Significant other contact made: Yes,  individual(s) contacted:  Daughter  Patient understands diagnosis: Yes. Discussing patient identified problems/goals with staff: Yes. Medical problems stabilized or resolved: Yes. Denies suicidal/homicidal ideation: Yes. Issues/concerns per patient self-inventory: No.   New problem(s) identified: No, Describe:  None   New Short Term/Long Term Goal(s): medication stabilization, elimination of SI thoughts, development of comprehensive mental wellness plan.   Patient Goals: "To get back on my medications, get rid of my suicidal thoughts, and get into rehab"/Update   Discharge Plan or Barriers: Pt plans to live with her family and attend outpatient therapy until a bed becomes available at a treatment facility.  Reason for Continuation of Hospitalization: Medication stabilization  Estimated Length of Stay: 3 to 5 days    Scribe for Treatment Team: Carney Harder 09/12/2021 3:19 PM

## 2021-09-13 MED ORDER — MIRTAZAPINE 15 MG PO TABS: 15.0000 mg | ORAL_TABLET | Freq: Every day | ORAL | 0 refills | Status: DC

## 2021-09-13 MED ORDER — LAMOTRIGINE 25 MG PO TABS: 25.0000 mg | ORAL_TABLET | Freq: Every day | ORAL | 0 refills | Status: DC

## 2021-09-13 MED ORDER — SENNOSIDES-DOCUSATE SODIUM 8.6-50 MG PO TABS
1.0000 | ORAL_TABLET | Freq: Two times a day (BID) | ORAL | Status: DC
  Administered 2021-09-13: 1 via ORAL
  Filled 2021-09-13 (×2): qty 1

## 2021-09-13 MED ORDER — NYSTATIN 100000 UNIT/GM EX POWD: 1.0000 "application " | Freq: Three times a day (TID) | CUTANEOUS | 0 refills | Status: DC

## 2021-09-13 MED ORDER — ZINC OXIDE 13 % EX CREA: TOPICAL_OINTMENT | Freq: Three times a day (TID) | CUTANEOUS | 0 refills | Status: DC | PRN

## 2021-09-13 MED ORDER — RISPERIDONE 2 MG PO TABS: 2.0000 mg | ORAL_TABLET | Freq: Every day | ORAL | 0 refills | Status: DC

## 2021-09-13 MED ORDER — BUDESONIDE 0.25 MG/2ML IN SUSP: 0.2500 mg | Freq: Two times a day (BID) | RESPIRATORY_TRACT | 0 refills | Status: DC

## 2021-09-13 MED ORDER — NYSTATIN 100000 UNIT/GM EX POWD: Freq: Two times a day (BID) | CUTANEOUS | 0 refills | Status: DC | PRN

## 2021-09-13 NOTE — Progress Notes (Signed)
  [facility] Adult Case Management Discharge Plan :  Will you be returning to the same living situation after discharge:  Yes,  Home with family  At discharge, do you have transportation home?: Yes,  Family  Do you have the ability to pay for your medications: Yes,  [facility]  Release of information consent forms completed and in the chart;  Patient's signature needed at discharge.  Patient to Follow up at:  Follow-up Information     [facility]. Schedule an appointment as soon as possible for a visit.   Why: A referral has been made to this provider for SAIOP/CDIOP and medication management services.  Please call to schedule an appointment as soon as possible. Contact information: [address] [place], [place] [address]  Phone: [phone] Fax:  [phone]        [facility] Follow up on 09/13/2021.   Why: You have a intake scheduled by phone on 09/13/2021 at 1:00pm  Please call the number listed here and speak with [facility]. Contact information: Address: [address], [place], [place] [address]  Phone: [phone], [phone] Fax: [phone]        [facility], [place]. Go on 09/17/2021.   Specialty: Behavioral Health Why: You have an appointment on 09/17/21 at 1:00 pm for substance abuse intensive outpatient therapy services (SAIOP), and medication management will be available.  This appointment will be held in person   Please bring your insurance card.  * IF you have not met your out of pocket or copay amounts, the fee for the assessment is $125. Contact information: [place] [address] [phone]         [facility] Follow up on 09/26/2021.   Specialty: Family Medicine Why: You have an appointment on 09/26/2021 at 11:15pm for medication management and primary care services. Contact information: [place] [address] [phone]                 Next level  of care provider has access to [place] and Suicide Prevention discussed: Yes,  with patient and daughter      Has patient been referred to the Quitline?: Patient refused referral  Patient has been referred for addiction treatment: Yes  Darleen Crocker, LCSWA 09/13/2021, 9:21 AM

## 2021-09-13 NOTE — Discharge Instructions (Addendum)
Dear [name], I am so glad you are feeling better and can be discharged today (09/13/2021)! You were admitted because of for worsening depression and wanting to hurt herself.  Your symptoms are from a diagnosis of bipolar disorder, type II, with psychotic features.  Please see the following instructions: For your: MOOD and HALLUCINATIONS, from bipolar disorder type II with psychotic features Please continue to take Lamictal 25 mg daily and Risperdal 2 mg nightly INSOMNIA, from possibly depression, sleep apnea, congestive heart failure Please continue taking Remeron 15 mg nightly and melatonin 5 mg nightly Melatonin is over-the-counter, and it helps with sleep quality.  It is not healthy fall asleep.  If you feel that melatonin does not work, feel free to stop taking it Remeron 15 mg nightly, is to help with sleep and depression.  Please continue to take this, however if you feel like it is not working, please see your doctor and talk about your symptoms before stopping it.  Stopping any medicines may cause side effects, please go talk to your doctor if you feel like the medicines are not working, rather than stopping them yourself. You have appointments to your primary care doctor, please make sure to go to that appointment and talk to them about possible congestive heart failure, you are shortness of breath, possible COPD, possible asthma, difficulty breathing when laying down, sleep apnea.  Please talk to them about a referral to a lung doctor (pulmonologist) for a sleep study and COPD/asthma.  It was a pleasure meeting you, [name]. I wish you and your family the best, and hope you stay happy and healthy!  Merrily Brittle, DO 09/13/2021

## 2021-09-13 NOTE — BHH Suicide Risk Assessment (Signed)
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 ([place]) Active Problems:   Cocaine abuse ([place])   Alcohol use disorder, moderate, dependence ([place])   Cannabis dependence with current use ([place])   Vitamin D deficiency   Total Time spent with patient: 30 minutes  Musculoskeletal: Strength & Muscle Tone: within normal limits Gait & Station: normal Patient leans: N/A  Psychiatric Specialty Exam  Presentation  General Appearance: Casual  Eye Contact:Fair  Speech:Clear and Coherent; Normal Rate  Speech Volume:Normal  Handedness:Right   Mood and Affect  Mood:Labile; Depressed (Patient would joke with author, and become teary when talking about dispo)  Duration of Depression Symptoms: Greater than two weeks  Affect:Congruent   Thought Process  Thought Processes:Coherent; Goal Directed; Linear  Descriptions of Associations:Intact  Orientation:Full (Time, Place and Person)  Thought Content:Logical  History of Schizophrenia/Schizoaffective disorder:No  Duration of Psychotic Symptoms:No data recorded Hallucinations:No data recorded Ideas of Reference:None  Suicidal Thoughts:No data recorded Homicidal Thoughts:No data recorded  Sensorium  Memory:Immediate Good; Recent Good; Remote Fair  Judgment:Fair  Insight:Fair   Executive Functions  Concentration:Good  Attention Span:Good  [place]  Language:Good   Psychomotor Activity  Psychomotor Activity:No data recorded  Assets  Assets:Communication Skills; Desire for Improvement; Resilience   Sleep  Sleep:No data recorded  Physical Exam: Physical Exam Vitals and nursing note reviewed.  Constitutional:      Appearance: Normal appearance.  HENT:     Head: Normocephalic and atraumatic.     Nose:  Nose normal.  Eyes:     Extraocular Movements: Extraocular movements intact.  Musculoskeletal:        General: Normal range of motion.     Cervical back: Normal range of motion.  Neurological:     General: No focal deficit present.     Mental Status: She is alert and oriented to person, place, and time.   Review of Systems  Constitutional:  Negative for fever and malaise/fatigue.  Respiratory:  Positive for cough and shortness of breath.   Cardiovascular:  Negative for palpitations.  Gastrointestinal:  Positive for constipation. Negative for vomiting.  Genitourinary:  Negative for dysuria.  Skin:  Positive for itching.  Neurological:  Negative for dizziness and headaches.  Psychiatric/Behavioral:  Negative for depression, hallucinations, substance abuse and suicidal ideas. The patient has insomnia.   Blood pressure 127/90, pulse 98, temperature 97.8 F (36.6 C), temperature source Oral, resp. rate 20, height 5' 8.5" (1.74 m), weight 88.5 kg, SpO2 98 %. Body mass index is 29.22 kg/m.  Mental Status Per Nursing Assessment::   On Admission:  Suicidal ideation indicated by patient, Self-harm thoughts, Suicide plan, Intention to act on suicide plan  Demographic Factors:  Divorced or widowed, Low socioeconomic status, and Unemployed  Loss Factors: Decline in physical health and Financial problems/change in socioeconomic status  Historical Factors: Prior suicide attempts  Risk Reduction Factors:   Sense of responsibility to family, Religious beliefs about death, Positive social support, and Positive coping skills or problem solving skills  Continued Clinical Symptoms:  Alcohol/Substance Abuse/Dependencies Previous Psychiatric Diagnoses and Treatments Medical Diagnoses and Treatments/Surgeries  Cognitive Features That Contribute To Risk:  None    Suicide Risk:  Mild:  Suicidal ideation of limited frequency, intensity, duration, and specificity.  There are no identifiable plans,  no associated intent, mild dysphoria and related symptoms, good self-control (both objective  and subjective assessment), few other risk factors, and identifiable protective factors, including available and accessible social support.   Follow-up Information     [facility]. Schedule an appointment as soon as possible for a visit.   Why: A referral has been made to this provider for SAIOP/CDIOP and medication management services.  Please call to schedule an appointment as soon as possible. Contact information: [place], [place] [address]  Phone: [phone] Fax:  [phone]        [facility] Follow up on 09/13/2021.   Why: You have a intake scheduled by phone on 09/13/2021 at 1:00pm  Please call the number listed here and speak with [facility]. Contact information: Address: [address], [place], [place] [address]  Phone: [phone], [phone] Fax: [phone]        [facility], [place]. Go on 09/17/2021.   Specialty: Behavioral Health Why: You have an appointment on 09/17/21 at 1:00 pm for substance abuse intensive outpatient therapy services (SAIOP), and medication management will be available.  This appointment will be held in person   Please bring your insurance card.  * IF you have not met your out of pocket or copay amounts, the fee for the assessment is $125. Contact information: [place] [address] [phone]         [facility] Follow up on 09/26/2021.   Specialty: Family Medicine Why: You have an appointment on 09/26/2021 at 11:15pm for medication management and primary care services. Contact information: [place] [address] [phone]                 Plan Of Care/Follow-up recommendations:  Activity:  as tolerated Diet:  cardiac diet Other:  follow up with primary care for continued blood pressure monitoring and evaluation of  cardiac function. You will also need a sleep study for evaluation of OSA.   Prescriptions for new medications provided for the patient to bridge to follow up appointment. The patient was informed that refills for these prescriptions are generally not provided, and patient is encouraged to attend all follow up appointments to address medication refills and adjustments.   Today's discharge was reviewed with treatment team, and the team is in agreement that the patient is ready for discharge. The patient is was of the discharge plan for today and has been given opportunity to ask questions. At time of discharge, the patient does not vocalize any acute harm to self or others, is goal directed, able to advocate for self and organizational baseline.   At discharge, the patient is instructed to:  Take all medications as prescribed. Report any adverse effects and or reactions from the medicines to her outpatient provider promptly.  Do not engage in alcohol and/or illegal drug use while on prescription medicines.  In the event of worsening symptoms, patient is instructed to call the crisis hotline, 911 and or go to the nearest ED for appropriate evaluation and treatment of symptoms.  Follow-up with primary care provider for further care of medical issues, concerns and or health care needs.   Maida Sale, MD 09/13/2021, 11:47 AM

## 2021-09-13 NOTE — Progress Notes (Signed)
D: Pt A & O X 4. Denies SI, HI, AVH and pain at this time. D/C home as ordered. Picked up in front of facility by [employer].  A: D/C instructions reviewed with pt including prescriptions and follow up appointments; compliance encouraged. All belongings from assigned locker returned to pt at time of departure. Scheduled medications given with verbal education and effects monitored. Safety checks maintained without incident till time of d/c.  R: Pt receptive to care. Compliant with medications when offered. Denies adverse drug reactions when assessed. Verbalized understanding related to d/c instructions. Signed belonging sheet in agreement with items received from locker. Ambulatory with a steady gait. Appears to be in no physical distress at time of departure.
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
## 2021-09-16 ENCOUNTER — Inpatient Hospital Stay (HOSPITAL_COMMUNITY)
Admission: EM | Admit: 2021-09-16 | Discharge: 2021-09-18 | DRG: 918 | Disposition: A | Discharge status: Other Acute Inpatient Hospital | Attending: Internal Medicine | Admitting: Internal Medicine

## 2021-09-16 ENCOUNTER — Other Ambulatory Visit: Payer: Self-pay

## 2021-09-16 DIAGNOSIS — F1220 Cannabis dependence, uncomplicated: Secondary | ICD-10-CM | POA: Diagnosis present

## 2021-09-16 DIAGNOSIS — I10 Essential (primary) hypertension: Secondary | ICD-10-CM | POA: Diagnosis present

## 2021-09-16 DIAGNOSIS — Z91018 Allergy to other foods: Secondary | ICD-10-CM | POA: Diagnosis not present

## 2021-09-16 DIAGNOSIS — I252 Old myocardial infarction: Secondary | ICD-10-CM | POA: Diagnosis not present

## 2021-09-16 DIAGNOSIS — Z7951 Long term (current) use of inhaled steroids: Secondary | ICD-10-CM | POA: Diagnosis not present

## 2021-09-16 DIAGNOSIS — I340 Nonrheumatic mitral (valve) insufficiency: Secondary | ICD-10-CM | POA: Diagnosis present

## 2021-09-16 DIAGNOSIS — Z20822 Contact with and (suspected) exposure to covid-19: Secondary | ICD-10-CM | POA: Diagnosis present

## 2021-09-16 DIAGNOSIS — Z833 Family history of diabetes mellitus: Secondary | ICD-10-CM

## 2021-09-16 DIAGNOSIS — Z87891 Personal history of nicotine dependence: Secondary | ICD-10-CM | POA: Diagnosis not present

## 2021-09-16 DIAGNOSIS — Z801 Family history of malignant neoplasm of trachea, bronchus and lung: Secondary | ICD-10-CM

## 2021-09-16 DIAGNOSIS — N179 Acute kidney failure, unspecified: Secondary | ICD-10-CM | POA: Diagnosis present

## 2021-09-16 DIAGNOSIS — Z886 Allergy status to analgesic agent status: Secondary | ICD-10-CM | POA: Diagnosis not present

## 2021-09-16 DIAGNOSIS — F1010 Alcohol abuse, uncomplicated: Secondary | ICD-10-CM | POA: Diagnosis present

## 2021-09-16 DIAGNOSIS — R7303 Prediabetes: Secondary | ICD-10-CM | POA: Diagnosis present

## 2021-09-16 DIAGNOSIS — Z803 Family history of malignant neoplasm of breast: Secondary | ICD-10-CM

## 2021-09-16 DIAGNOSIS — F4310 Post-traumatic stress disorder, unspecified: Secondary | ICD-10-CM | POA: Diagnosis present

## 2021-09-16 DIAGNOSIS — G4700 Insomnia, unspecified: Secondary | ICD-10-CM | POA: Diagnosis present

## 2021-09-16 DIAGNOSIS — T450X1A Poisoning by antiallergic and antiemetic drugs, accidental (unintentional), initial encounter: Secondary | ICD-10-CM | POA: Diagnosis present

## 2021-09-16 DIAGNOSIS — F1910 Other psychoactive substance abuse, uncomplicated: Secondary | ICD-10-CM | POA: Diagnosis present

## 2021-09-16 DIAGNOSIS — F3181 Bipolar II disorder: Secondary | ICD-10-CM | POA: Diagnosis present

## 2021-09-16 DIAGNOSIS — T450X2A Poisoning by antiallergic and antiemetic drugs, intentional self-harm, initial encounter: Secondary | ICD-10-CM | POA: Diagnosis present

## 2021-09-16 DIAGNOSIS — J45909 Unspecified asthma, uncomplicated: Secondary | ICD-10-CM | POA: Diagnosis present

## 2021-09-16 DIAGNOSIS — F1410 Cocaine abuse, uncomplicated: Secondary | ICD-10-CM | POA: Diagnosis present

## 2021-09-16 DIAGNOSIS — Z8249 Family history of ischemic heart disease and other diseases of the circulatory system: Secondary | ICD-10-CM

## 2021-09-16 DIAGNOSIS — Z79899 Other long term (current) drug therapy: Secondary | ICD-10-CM | POA: Diagnosis not present

## 2021-09-16 DIAGNOSIS — T391X2A Poisoning by 4-Aminophenol derivatives, intentional self-harm, initial encounter: Secondary | ICD-10-CM | POA: Diagnosis present

## 2021-09-16 DIAGNOSIS — Z885 Allergy status to narcotic agent status: Secondary | ICD-10-CM | POA: Diagnosis not present

## 2021-09-16 DIAGNOSIS — K219 Gastro-esophageal reflux disease without esophagitis: Secondary | ICD-10-CM | POA: Diagnosis present

## 2021-09-16 DIAGNOSIS — E119 Type 2 diabetes mellitus without complications: Secondary | ICD-10-CM | POA: Diagnosis present

## 2021-09-16 DIAGNOSIS — F17210 Nicotine dependence, cigarettes, uncomplicated: Secondary | ICD-10-CM | POA: Diagnosis present

## 2021-09-16 DIAGNOSIS — R7989 Other specified abnormal findings of blood chemistry: Secondary | ICD-10-CM | POA: Diagnosis present

## 2021-09-16 DIAGNOSIS — R9431 Abnormal electrocardiogram [ECG] [EKG]: Secondary | ICD-10-CM | POA: Diagnosis present

## 2021-09-16 DIAGNOSIS — Z8711 Personal history of peptic ulcer disease: Secondary | ICD-10-CM

## 2021-09-16 DIAGNOSIS — M1990 Unspecified osteoarthritis, unspecified site: Secondary | ICD-10-CM | POA: Diagnosis present

## 2021-09-16 DIAGNOSIS — Z9151 Personal history of suicidal behavior: Secondary | ICD-10-CM

## 2021-09-16 LAB — ACETAMINOPHEN LEVEL
Acetaminophen (Tylenol), Serum: 201 ug/mL (ref 10–30)
Acetaminophen (Tylenol), Serum: 11 ug/mL (ref 10–30)

## 2021-09-16 LAB — COMPREHENSIVE METABOLIC PANEL
ALT: 84 U/L — ABNORMAL HIGH (ref 0–44)
AST: 38 U/L (ref 15–41)
Albumin: 4.1 g/dL (ref 3.5–5.0)
Alkaline Phosphatase: 78 U/L (ref 38–126)
Anion gap: 11 (ref 5–15)
BUN: 16 mg/dL (ref 6–20)
CO2: 22 mmol/L (ref 22–32)
Calcium: 9.4 mg/dL (ref 8.9–10.3)
Chloride: 102 mmol/L (ref 98–111)
Creatinine, Ser: 1.35 mg/dL — ABNORMAL HIGH (ref 0.44–1.00)
GFR, Estimated: 46 mL/min — ABNORMAL LOW (ref >60)
Glucose, Bld: 114 mg/dL — ABNORMAL HIGH (ref 70–99)
Potassium: 4.1 mmol/L (ref 3.5–5.1)
Sodium: 135 mmol/L (ref 135–145)
Total Bilirubin: 1.2 mg/dL (ref 0.3–1.2)
Total Protein: 7.9 g/dL (ref 6.5–8.1)

## 2021-09-16 LAB — CBC WITH DIFFERENTIAL/PLATELET
Abs Immature Granulocytes: 0.08 10*3/uL — ABNORMAL HIGH (ref 0.00–0.07)
Basophils Absolute: 0.1 10*3/uL (ref 0.0–0.1)
Basophils Relative: 1 %
Eosinophils Absolute: 0.8 10*3/uL — ABNORMAL HIGH (ref 0.0–0.5)
Eosinophils Relative: 5 %
HCT: 39 % (ref 36.0–46.0)
Hemoglobin: 12.7 g/dL (ref 12.0–15.0)
Immature Granulocytes: 1 %
Lymphocytes Relative: 34 %
Lymphs Abs: 5 10*3/uL — ABNORMAL HIGH (ref 0.7–4.0)
MCH: 26.9 pg (ref 26.0–34.0)
MCHC: 32.6 g/dL (ref 30.0–36.0)
MCV: 82.6 fL (ref 80.0–100.0)
Monocytes Absolute: 1.5 10*3/uL — ABNORMAL HIGH (ref 0.1–1.0)
Monocytes Relative: 10 %
Neutro Abs: 7.4 10*3/uL (ref 1.7–7.7)
Neutrophils Relative %: 49 %
Platelet Morphology: NORMAL
Platelets: 354 10*3/uL (ref 150–400)
RBC: 4.72 MIL/uL (ref 3.87–5.11)
RDW: 17 % — ABNORMAL HIGH (ref 11.5–15.5)
WBC: 14.8 10*3/uL — ABNORMAL HIGH (ref 4.0–10.5)
nRBC: 0 % (ref 0.0–0.2)

## 2021-09-16 LAB — BLOOD GAS, VENOUS
Acid-base deficit: 1.5 mmol/L (ref 0.0–2.0)
Bicarbonate: 24.7 mmol/L (ref 20.0–28.0)
O2 Saturation: 56.7 %
Patient temperature: 98.6
pCO2, Ven: 50.6 mmHg (ref 44.0–60.0)
pH, Ven: 7.31 (ref 7.250–7.430)
pO2, Ven: 34.2 mmHg (ref 32.0–45.0)

## 2021-09-16 LAB — CBG MONITORING, ED
Glucose-Capillary: 115 mg/dL — ABNORMAL HIGH (ref 70–99)
Glucose-Capillary: 79 mg/dL (ref 70–99)
Glucose-Capillary: 90 mg/dL (ref 70–99)
Glucose-Capillary: 115 mg/dL — ABNORMAL HIGH (ref 70–99)
Glucose-Capillary: 100 mg/dL — ABNORMAL HIGH (ref 70–99)

## 2021-09-16 LAB — SALICYLATE LEVEL: Salicylate Lvl: <7 mg/dL — ABNORMAL LOW (ref 7.0–30.0)

## 2021-09-16 LAB — PROTIME-INR
INR: 1 (ref 0.8–1.2)
Prothrombin Time: 13.7 seconds (ref 11.4–15.2)

## 2021-09-16 LAB — RAPID URINE DRUG SCREEN, HOSP PERFORMED
Amphetamines: NOT DETECTED
Barbiturates: NOT DETECTED
Benzodiazepines: NOT DETECTED
Cocaine: POSITIVE — AB
Opiates: NOT DETECTED
Tetrahydrocannabinol: NOT DETECTED

## 2021-09-16 LAB — HCG, SERUM, QUALITATIVE: Preg, Serum: NEGATIVE

## 2021-09-16 LAB — MAGNESIUM: Magnesium: 2.2 mg/dL (ref 1.7–2.4)

## 2021-09-16 LAB — RESP PANEL BY RT-PCR (FLU A&B, COVID) ARPGX2
Influenza A by PCR: NEGATIVE
Influenza B by PCR: NEGATIVE
SARS Coronavirus 2 by RT PCR: NEGATIVE

## 2021-09-16 LAB — PHOSPHORUS: Phosphorus: 4.7 mg/dL — ABNORMAL HIGH (ref 2.5–4.6)

## 2021-09-16 LAB — ETHANOL: Alcohol, Ethyl (B): <10 mg/dL (ref <10)

## 2021-09-16 MED ORDER — LORAZEPAM 2 MG/ML IJ SOLN
1.0000 mg | Freq: Once | INTRAMUSCULAR | Status: AC
  Administered 2021-09-16: 1 mg via INTRAVENOUS
  Filled 2021-09-16: qty 1

## 2021-09-16 MED ORDER — MAGNESIUM SULFATE 2 GM/50ML IV SOLN
2.0000 g | Freq: Once | INTRAVENOUS | Status: AC
  Administered 2021-09-16: 2 g via INTRAVENOUS
  Filled 2021-09-16: qty 50

## 2021-09-16 MED ORDER — DEXTROSE 5 % IV SOLN
15.0000 mg/kg/h | INTRAVENOUS | Status: DC
  Administered 2021-09-16 – 2021-09-17 (×3): 15 mg/kg/h via INTRAVENOUS
  Filled 2021-09-16 (×4): qty 90

## 2021-09-16 MED ORDER — CHLORHEXIDINE GLUCONATE CLOTH 2 % EX PADS: 6.0000 | MEDICATED_PAD | Freq: Every day | CUTANEOUS | Status: DC

## 2021-09-16 MED ORDER — PROCHLORPERAZINE EDISYLATE 10 MG/2ML IJ SOLN: 5.0000 mg | INTRAMUSCULAR | Status: DC | PRN

## 2021-09-16 MED ORDER — ACETYLCYSTEINE LOAD VIA INFUSION
150.0000 mg/kg | Freq: Once | INTRAVENOUS | Status: AC
  Administered 2021-09-16: 13275 mg via INTRAVENOUS
  Filled 2021-09-16: qty 436

## 2021-09-16 MED ORDER — SODIUM CHLORIDE 0.9 % IV SOLN
15.0000 mg/kg | Freq: Once | INTRAVENOUS | Status: AC
  Administered 2021-09-16: 1330 mg via INTRAVENOUS
  Filled 2021-09-16: qty 1.33

## 2021-09-16 MED ORDER — SODIUM CHLORIDE 0.9 % IV BOLUS
1000.0000 mL | Freq: Once | INTRAVENOUS | Status: AC
  Administered 2021-09-16: 1000 mL via INTRAVENOUS

## 2021-09-16 MED ORDER — LACTATED RINGERS IV SOLN: INTRAVENOUS | Status: DC

## 2021-09-16 MED ORDER — NALOXONE HCL 2 MG/2ML IJ SOSY
1.0000 mg | PREFILLED_SYRINGE | INTRAMUSCULAR | Status: DC | PRN
  Administered 2021-09-16 (×2): 2 mg via INTRAVENOUS
  Filled 2021-09-16 (×3): qty 2

## 2021-09-16 NOTE — H&P (Signed)
History and Physical    Denise Knight FKC:[id number] DOB: 15-Mar-1966 DOA: 09/16/2021  PCP: [facility]   Patient coming from: Home.  I have personally briefly reviewed patient's old medical records in [place]  Chief Complaint: Tylenol PM and OD.  HPI: Denise Knight is a 55 y.o. female with medical history significant of seasonal allergies, anxiety, bipolar 1 disorder, depression, PTSD, schizophrenia, history of suicide attempt, substance abuse, tobacco use disorder, osteoarthritis, asthma, prediabetes, GERD, hypertension, mitral incompetence, PUD with stomach ulcer who was brought via EMS due to ingesting an unknown amount of Tylenol PM tablets with self-harm purposes.  She is currently somnolent and unable to provide further information  ED Course: Initial vital signs were temperature 98 F, pulse 122, respirations 22, BP 156/118 mmHg O2 sat 96% on room air.  The patient was started on Mucomyst by pharmacy, received lorazepam 1 mg IVP and was given 1330 mg of fomepizole IVPB x1.  Lab work: CBC had a white count of 14.8, hemoglobin 12.7 g/dL and platelets 354.  Salicylate, alcohol, magnesium were normal.  CMP showed a glucose of 114 and creatinine of 1.35 mg/dL.  ALT was 84 units/L.  Review of Systems: As per HPI otherwise all other systems reviewed and are negative.  Past Medical History:  Diagnosis Date   Allergy    seasonal   Anxiety    Arthritis    "left knee" (07/05/2016)   Asthma    Bipolar 1 disorder ([place])    Depression    Diabetes mellitus without complication (HCC)    pre-   GERD (gastroesophageal reflux disease)    Hypertension    MI (mitral incompetence)    Post traumatic stress disorder    Schizophrenia ([place])    Stomach ulcer    Substance abuse ([place])    cocaine quit july 2020   Suicide attempt [facility])    Past Surgical History:  Procedure Laterality Date   CARDIAC CATHETERIZATION  07/05/2016   CARDIAC CATHETERIZATION  N/A 07/05/2016   Procedure: Left Heart Cath and Coronary Angiography;  Surgeon: Adrian Prows, MD;  Location: [place] [facility];  Service: Cardiovascular;  Laterality: N/A;   CYST EXCISION Right    "wrist"   DILATION AND CURETTAGE OF UTERUS     FOOT SURGERY Bilateral    "corns removed"   LEFT HEART CATH AND CORONARY ANGIOGRAPHY N/A 12/24/2017   Procedure: LEFT HEART CATH AND CORONARY ANGIOGRAPHY;  Surgeon: Nigel Mormon, MD;  Location: [place] [facility];  Service: Cardiovascular;  Laterality: N/A;   TUBAL LIGATION     Social History  reports that she has been smoking cigarettes. She has a 8.75 pack-year smoking history. She has never used smokeless tobacco. She reports current alcohol use of about 7.0 standard drinks per week. She reports current drug use. Drug: Marijuana.  Allergies  Allergen Reactions   Aspirin Hives   Food Hives    "Regular butter"   Morphine And Related Hives   Family History  Problem Relation Age of Onset   Breast cancer Other    Lung cancer Other    Congestive Heart Failure Other    Diabetes Other    Hypertension Other    Colon cancer Neg Hx    Colon polyps Neg Hx    Esophageal cancer Neg Hx    Stomach cancer Neg Hx    Rectal cancer Neg Hx    Prior to Admission medications   Medication Sig Start Date End  Date Taking? Authorizing Provider  albuterol (VENTOLIN HFA) 108 (90 Base) MCG/ACT inhaler Inhale 1-2 puffs into the lungs every 6 (six) hours as needed for wheezing or shortness of breath. 10/13/20   Loura Halt A, NP  amLODipine (NORVASC) 10 MG tablet Take 10 mg by mouth daily. 05/03/21   [provider]  budesonide (PULMICORT) 0.25 MG/2ML nebulizer solution Take 2 mLs (0.25 mg total) by nebulization 2 (two) times daily. 09/13/21 10/13/21  Merrily Brittle, DO  cetirizine (ZYRTEC) 10 MG tablet Take 1 tablet (10 mg total) by mouth daily. 10/13/20   Loura Halt A, NP  lamoTRIgine (LAMICTAL) 25 MG tablet Take 1 tablet (25 mg total) by mouth daily.  09/14/21 10/14/21  Merrily Brittle, DO  mirtazapine (REMERON) 15 MG tablet Take 1 tablet (15 mg total) by mouth at bedtime. 09/13/21 10/13/21  Merrily Brittle, DO  mupirocin 2% oint-hydrocortisone 2.5% cream-nystatin cream-zinc oxide 13% oint 1:1:1:5 mixture Apply topically 3 (three) times daily as needed for rash, irritation or itching. Apply to affected area 09/13/21   Merrily Brittle, DO  nystatin (MYCOSTATIN/NYSTOP) powder Apply topically 2 (two) times daily as needed (Place under skin fold for candidal skin infection). Apply to underarm pit or any skin folds to help with skin yeast infection and moisture 09/13/21   Merrily Brittle, DO  nystatin (MYCOSTATIN/NYSTOP) powder Apply 1 application topically 3 (three) times daily. 09/13/21   Merrily Brittle, DO  risperiDONE (RISPERDAL) 2 MG tablet Take 1 tablet (2 mg total) by mouth at bedtime. 09/13/21 10/13/21  Merrily Brittle, DO  simvastatin (ZOCOR) 40 MG tablet Take 40 mg by mouth daily. 05/09/21   [provider]  amantadine (SYMMETREL) 100 MG capsule Take 1 capsule (100 mg total) by mouth 2 (two) times daily. 01/17/20 10/13/20  Connye Burkitt, NP   Physical Exam: Vitals:   09/16/21 1030 09/16/21 1035 09/16/21 1107 09/16/21 1200  BP:  (!) 131/91 109/72 111/80  Pulse: (!) 108 (!) 110 96 97  Resp: (!) 24 (!) 31 (!) 35 (!) 30  Temp:      TempSrc:      SpO2: (!) 89% 93% 90% 96%  Weight:      Height:       Constitutional: NAD, calm, comfortable Eyes: Mild mydriasis.  PERRL, lids and conjunctivae normal.  Injected sclera bilaterally ENMT: Mucous membranes are dry.  Posterior pharynx clear of any exudate or lesions. Neck: normal, supple, no masses, no thyromegaly Respiratory: clear to auscultation bilaterally, no wheezing, no crackles. Normal respiratory effort. No accessory muscle use.  Cardiovascular: Tachycardic with a RR in the 110s, no murmurs / rubs / gallops. No extremity edema. 2+ pedal pulses. No carotid bruits.  Abdomen: Obese, no distention.  Soft,  no tenderness, no masses palpated. No hepatosplenomegaly. Bowel sounds positive.  Musculoskeletal: no clubbing / cyanosis.  Good ROM, no contractures. Normal muscle tone.  Skin: no rashes, lesions, ulcers on very limited dermatological examination. Neurologic: CN 2-12 grossly intact.  Grossly nonfocal. Psychiatric: Somnolent but restless on occasion.  Response to tactile and some verbal stimuli.  Does not answer any questions.  Labs on Admission: I have personally reviewed following labs and imaging studies  CBC: Recent Labs  Lab 09/16/21 1014  WBC 14.8*  NEUTROABS 7.4  HGB 12.7  HCT 39.0  MCV 82.6  PLT 540    Basic Metabolic Panel: Recent Labs  Lab 09/16/21 1014 09/16/21 1029  NA 135  --   K 4.1  --   CL 102  --  CO2 22  --   GLUCOSE 114*  --   BUN 16  --   CREATININE 1.35*  --   CALCIUM 9.4  --   MG  --  2.2    GFR: Estimated Creatinine Clearance: 54.1 mL/min (A) (by C-G formula based on SCr of 1.35 mg/dL (H)).  Liver Function Tests: Recent Labs  Lab 09/16/21 1014  AST 38  ALT 84*  ALKPHOS 78  BILITOT 1.2  PROT 7.9  ALBUMIN 4.1    Radiological Exams on Admission: No results found.  EKG: Independently reviewed.  Vent. rate 111 BPM PR interval 140 ms QRS duration 101 ms QT/QTcB 376/511 ms P-R-T axes 55 3 38 Sinus tachycardia Inferior infarct, old Prolonged QT interval Baseline wander in lead(s) V2 V3 Partial missing lead(s): V2  Assessment/Plan Principal Problem:   Polysubstance abuse (HCC)   Acetaminophen overdose, intentional self-harm Admit to stepdown/inpatient. Continue IV fluids. Continue Mucomyst for pharmacy. Follow-up APAP level, hepatic function, PT/INR. Consult behavioral health once more alert.  Active Problems:   Prolonged QT interval Responded to magnesium sulfate. Follow-up EKG in AM. Avoid QT prolonging medications.    Diphenhydramine overdose Monitor QT segment.    GERD (gastroesophageal reflux disease) Protonix  40 mg IVP daily.    Hypertension Resume amlodipine once more alert. Hydralazine IV as needed.    Elevated serum creatinine LR bolus 1000 mL x 1. LR 125 mL/h.      DVT prophylaxis: SCDs. Code Status:   Full code. Family Communication:   Disposition Plan:   Patient is from:  Home.  Anticipated DC to:  TBD.  Anticipated DC date:  09/18/2021 or 09/19/2021.Marland Kitchen  Anticipated DC barriers: Clinical status. Consults called:  Will need behavioral health evaluation once alert. Admission status:  Inpatient/stepdown.  Severity of Illness:  High severity in the setting of acetaminophen and diphenhydramine overdose.  The patient will be admitted to the SDU to continue treatment.  Reubin Milan MD [facility]  How to contact the [facility] Attending or Consulting provider [place] or covering provider during after hours [place], for this patient?   Check the care team in [facility] and look for a) attending/consulting [facility] provider listed and b) the [facility] team listed Log into [email or web address] and use [place]'s universal password to access. If you do not have the password, please contact the hospital operator. Locate the [facility] provider you are looking for under [facility] and page to a number that you can be directly reached. If you still have difficulty reaching the provider, please page the [facility] (Director on Call) for the Hospitalists listed on amion for assistance.  09/16/2021, 1:02 PM   This document was prepared using Dragon voice recognition software and may contain some unintended transcription errors.

## 2021-09-16 NOTE — ED Notes (Signed)
Showed hospitalist the EKG that was done earlier

## 2021-09-16 NOTE — ED Triage Notes (Signed)
Patient reports taking whole bottle of tylenol pm, unknown dosage. Endorses SI. Refuses to answer any other questions.

## 2021-09-16 NOTE — ED Notes (Signed)
Left AC IV got infiltrated. Arm elevated  to reduce swelling.

## 2021-09-16 NOTE — ED Notes (Signed)
Patient is alert and and oriented to her name and birthday only. Patient is having hallucinations stating "children in the chair are cold." She says she sees children in the chair. The patient states she is here because she tried to kill herself. Iv in the RAC removed as it does not work

## 2021-09-16 NOTE — ED Notes (Signed)
Wells Guiles PA aware of critical acetaminophen level 201.

## 2021-09-16 NOTE — ED Notes (Signed)
Showed Dr. Dina Rich pt's admission EKG.

## 2021-09-16 NOTE — ED Provider Notes (Addendum)
[facility] DEPT Provider Note   CSN: [id number] Arrival date & time: 09/16/21  8144     History Chief Complaint  Patient presents with   Ingestion    Denise Knight is a 55 y.o. female who presents with intentional Tylenol PM overdose in a suicide attempt.  No further history can be obtained from the patient due to gradually declining mental status.  It appears that medications were taken this morning prior to arrival to the emergency department.  I have personally reviewed this patient's medical she has history ofRecords.  Cocaine abuse, NSTEMI, alcohol use disorder, cannabis dependency, major depression and bipolar 2 disorder.  Patient additionally is history of hypertension and diabetes. HPI     Past Medical History:  Diagnosis Date   Allergy    seasonal   Anxiety    Arthritis    "left knee" (07/05/2016)   Asthma    Bipolar 1 disorder ([place])    Depression    Diabetes mellitus without complication (HCC)    pre-   GERD (gastroesophageal reflux disease)    Hypertension    MI (mitral incompetence)    Post traumatic stress disorder    Schizophrenia ([place])    Stomach ulcer    Substance abuse ([place])    cocaine quit july 2020   Suicide attempt [facility])     Patient Active Problem List   Diagnosis Date Noted   Acetaminophen overdose, intentional self-harm, initial encounter ([place]) 09/16/2021   Severe depressed bipolar II disorder with psychotic features ([place]) 09/04/2021   Vitamin D deficiency 09/03/2021   Cannabis dependence with current use ([place]) 09/02/2021   MDD (major depressive disorder) 01/12/2020   Severe recurrent major depression without psychotic features ([place]) 01/12/2020   Alcohol use disorder, moderate, dependence ([place]) 01/12/2020   Cocaine abuse ([place]) 12/27/2017   Rectal bleeding 12/27/2017   NSTEMI (non-ST elevated myocardial infarction) ([place]) 12/23/2017   Acute viral bronchitis 06/14/2017   Asthma exacerbation 06/14/2017    Chest pain 07/05/2016    Past Surgical History:  Procedure Laterality Date   CARDIAC CATHETERIZATION  07/05/2016   CARDIAC CATHETERIZATION N/A 07/05/2016   Procedure: Left Heart Cath and Coronary Angiography;  Surgeon: Adrian Prows, MD;  Location: [facility];  Service: Cardiovascular;  Laterality: N/A;   CYST EXCISION Right    "wrist"   DILATION AND CURETTAGE OF UTERUS     FOOT SURGERY Bilateral    "corns removed"   LEFT HEART CATH AND CORONARY ANGIOGRAPHY N/A 12/24/2017   Procedure: LEFT HEART CATH AND CORONARY ANGIOGRAPHY;  Surgeon: Nigel Mormon, MD;  Location: [facility];  Service: Cardiovascular;  Laterality: N/A;   TUBAL LIGATION       OB History   No obstetric history on file.     Family History  Problem Relation Age of Onset   Breast cancer Other    Lung cancer Other    Congestive Heart Failure Other    Diabetes Other    Hypertension Other    Colon cancer Neg Hx    Colon polyps Neg Hx    Esophageal cancer Neg Hx    Stomach cancer Neg Hx    Rectal cancer Neg Hx     Social History   Tobacco Use   Smoking status: Some Days    Packs/day: 0.25    Years: 35.00    Pack years: 8.75    Types: Cigarettes    Last attempt to quit: 05/30/2019    Years  since quitting: 2.3   Smokeless tobacco: Never  Vaping Use   Vaping Use: Never used  Substance Use Topics   Alcohol use: Yes    Alcohol/week: 7.0 standard drinks    Types: 7 Cans of beer per week   Drug use: Yes    Types: Marijuana    Home Medications Prior to Admission medications   Medication Sig Start Date End Date Taking? Authorizing Provider  albuterol (VENTOLIN HFA) 108 (90 Base) MCG/ACT inhaler Inhale 1-2 puffs into the lungs every 6 (six) hours as needed for wheezing or shortness of breath. 10/13/20   Loura Halt A, NP  amLODipine (NORVASC) 10 MG tablet Take 10 mg by mouth daily. 05/03/21   [provider]  budesonide (PULMICORT) 0.25 MG/2ML nebulizer solution Take 2 mLs (0.25 mg  total) by nebulization 2 (two) times daily. 09/13/21 10/13/21  Merrily Brittle, DO  cetirizine (ZYRTEC) 10 MG tablet Take 1 tablet (10 mg total) by mouth daily. 10/13/20   Loura Halt A, NP  lamoTRIgine (LAMICTAL) 25 MG tablet Take 1 tablet (25 mg total) by mouth daily. 09/14/21 10/14/21  Merrily Brittle, DO  mirtazapine (REMERON) 15 MG tablet Take 1 tablet (15 mg total) by mouth at bedtime. 09/13/21 10/13/21  Merrily Brittle, DO  mupirocin 2% oint-hydrocortisone 2.5% cream-nystatin cream-zinc oxide 13% oint 1:1:1:5 mixture Apply topically 3 (three) times daily as needed for rash, irritation or itching. Apply to affected area 09/13/21   Merrily Brittle, DO  nystatin (MYCOSTATIN/NYSTOP) powder Apply topically 2 (two) times daily as needed (Place under skin fold for candidal skin infection). Apply to underarm pit or any skin folds to help with skin yeast infection and moisture 09/13/21   Merrily Brittle, DO  nystatin (MYCOSTATIN/NYSTOP) powder Apply 1 application topically 3 (three) times daily. 09/13/21   Merrily Brittle, DO  risperiDONE (RISPERDAL) 2 MG tablet Take 1 tablet (2 mg total) by mouth at bedtime. 09/13/21 10/13/21  Merrily Brittle, DO  simvastatin (ZOCOR) 40 MG tablet Take 40 mg by mouth daily. 05/09/21   [provider]  amantadine (SYMMETREL) 100 MG capsule Take 1 capsule (100 mg total) by mouth 2 (two) times daily. 01/17/20 10/13/20  Connye Burkitt, NP    Allergies    Aspirin, Food, and Morphine and related  Review of Systems   Review of Systems  Unable to perform ROS: Mental status change (intentional OD, obtunded)   Physical Exam Updated Vital Signs BP 111/80   Pulse 97   Temp 98.2 F (36.8 C)   Resp (!) 30   Ht 5' 8"  (1.727 m)   Wt 88.5 kg   SpO2 96%   BMI 29.65 kg/m   Physical Exam Vitals and nursing note reviewed.  Constitutional:      Appearance: She is obese. She is toxic-appearing.  HENT:     Head: Normocephalic and atraumatic.     Nose: Nose normal. No congestion.      Mouth/Throat:     Mouth: Mucous membranes are moist.     Pharynx: Oropharynx is clear. Uvula midline. No oropharyngeal exudate or posterior oropharyngeal erythema.     Tonsils: No tonsillar exudate.  Eyes:     General: Lids are normal.        Right eye: No discharge.        Left eye: No discharge.     Conjunctiva/sclera: Conjunctivae normal.     Pupils: Pupils are equal, round, and reactive to light.  Neck:     Trachea: Trachea and phonation normal.  Cardiovascular:     Rate and Rhythm: Regular rhythm. Tachycardia present.     Pulses: Normal pulses.     Heart sounds: Normal heart sounds. No murmur heard. Pulmonary:     Effort: Pulmonary effort is normal. Tachypnea present. No bradypnea, accessory muscle usage, prolonged expiration or respiratory distress.     Breath sounds: Normal breath sounds. No wheezing or rales.  Chest:     Chest wall: No mass, lacerations, deformity, swelling, tenderness, crepitus or edema.  Abdominal:     General: Bowel sounds are normal. There is no distension.     Palpations: Abdomen is soft.     Tenderness: There is no abdominal tenderness. There is no right CVA tenderness, left CVA tenderness, guarding or rebound.  Musculoskeletal:        General: No deformity.     Cervical back: Normal range of motion and neck supple. No edema, rigidity or crepitus. No pain with movement, spinous process tenderness or muscular tenderness.     Right lower leg: No edema.     Left lower leg: No edema.  Lymphadenopathy:     Cervical: No cervical adenopathy.  Skin:    General: Skin is warm and dry.     Capillary Refill: Capillary refill takes less than 2 seconds.     Findings: No rash.  Neurological:     Mental Status: She is unresponsive.     GCS: GCS eye subscore is 1. GCS verbal subscore is 2. GCS motor subscore is 4.  Psychiatric:        Attention and Perception: She perceives visual hallucinations.        Thought Content: Thought content includes suicidal  ideation. Thought content includes suicidal plan.     Comments: Patient initially obtunded but subsequently began to respond to internal stimuli, likely secondary to Benadryl intoxication.    ED Results / Procedures / Treatments   Labs (all labs ordered are listed, but only abnormal results are displayed) Labs Reviewed  COMPREHENSIVE METABOLIC PANEL - Abnormal; Notable for the following components:      Result Value   Glucose, Bld 114 (*)    Creatinine, Ser 1.35 (*)    ALT 84 (*)    GFR, Estimated 46 (*)    All other components within normal limits  CBC WITH DIFFERENTIAL/PLATELET - Abnormal; Notable for the following components:   WBC 14.8 (*)    RDW 17.0 (*)    Lymphs Abs 5.0 (*)    Monocytes Absolute 1.5 (*)    Eosinophils Absolute 0.8 (*)    Abs Immature Granulocytes 0.08 (*)    All other components within normal limits  ACETAMINOPHEN LEVEL - Abnormal; Notable for the following components:   Acetaminophen (Tylenol), Serum 201 (*)    All other components within normal limits  SALICYLATE LEVEL - Abnormal; Notable for the following components:   Salicylate Lvl <8.5 (*)    All other components within normal limits  CBG MONITORING, ED - Abnormal; Notable for the following components:   Glucose-Capillary 115 (*)    All other components within normal limits  RESP PANEL BY RT-PCR (FLU A&B, COVID) ARPGX2  ETHANOL  MAGNESIUM  RAPID URINE DRUG SCREEN, HOSP PERFORMED  BLOOD GAS, VENOUS  I-STAT BETA HCG BLOOD, ED ([facility], [facility], AP ONLY)  CBG MONITORING, ED    EKG EKG Interpretation  Date/Time:  Sunday September 16 2021 10:20:17 EST Ventricular Rate:  111 PR Interval:  140 QRS Duration: 101 QT Interval:  376 QTC Calculation: 511  R Axis:   3 Text Interpretation: Sinus tachycardia Inferior infarct, old Prolonged QT interval Baseline wander in lead(s) V2 V3 Partial missing lead(s): V2 Confirmed by Lacretia Leigh ([id number]) on 09/16/2021 10:37:24 AM  Radiology No results  found.  Procedures .Critical Care Performed by: Emeline Darling, PA-C Authorized by: Emeline Darling, PA-C   Critical care provider statement:    Critical care time (minutes):  60   Critical care was time spent personally by me on the following activities:  Development of treatment plan with patient or surrogate, discussions with consultants, evaluation of patient's response to treatment, examination of patient, obtaining history from patient or surrogate, ordering and performing treatments and interventions, ordering and review of laboratory studies, ordering and review of radiographic studies, pulse oximetry and re-evaluation of patient's condition   Medications Ordered in ED Medications  naloxone (NARCAN) injection 1 mg (2 mg Intravenous Given 09/16/21 1037)  LORazepam (ATIVAN) injection 1 mg (has no administration in time range)  acetylcysteine (ACETADOTE) 30.5 mg/mL load via infusion 13,275 mg (has no administration in time range)    Followed by  acetylcysteine (ACETADOTE) 18,000 mg in dextrose 5 % 590 mL (30.5085 mg/mL) infusion (has no administration in time range)  sodium chloride 0.9 % bolus 1,000 mL (has no administration in time range)  fomepizole (ANTIZOL) 1,330 mg in sodium chloride 0.9 % 100 mL IVPB (has no administration in time range)    ED Course  I have reviewed the triage vital signs and the nursing notes.  Pertinent labs & imaging results that were available during my care of the patient were reviewed by me and considered in my medical decision making (see chart for details).  Clinical Course as of 09/16/21 1247  Sun Sep 16, 2021  1059 Case discussed with poison control officer Barnett Applebaum, who indicated that concerns will be peak QTC prolongation due to potassium channel blockade from Benadryl contained in Tylenol PM.  Recommending fluids and benzos as needed for agitation from anticholinergic effect.  She also recommends optimization of potassium and magnesium  with potassium recommended to be 4-4.5 and magnesium over 2.  Treatments holding NAC at this time states that if Tylenol level is detectable or LFTs are elevated begin NAC immediately.  I appreciate her collaboration of care of this patient. [RS]  1217 Case again discussed with poison control officer May, who recommends one-time dose of fomepizole 15 mg/kg due to elevation of acetaminophen level and transaminitis.  Recommends serial EKGs until QTC is less than 500.  Additionally recommends recheck of INR, hepatic function panel, and acetaminophen level at the 22-hour mark to direct whether or not patient needs continued management after the 24-hour mark.  We will proceed with NAC infusion and fomepizole dose.  I appreciate her collaboration in the care of this patient.  She reiterated the importance of avoiding QTC prolonging medications; recommend benzos for any agitation that may occur. [RS]  1230 Case discussed with the patient's daughter over the phone who states that her mother is a chronic cocaine abuser but she is unaware what other substances her mother may use.  She states she is not surprised by her mother suicide attempt as she has a history of bipolar disorder and suicidality.  I appreciate her insight into the patient's history. [RS]  1238 Consult to hospitalist, Dr. Olevia Bowens who is agreeable to admitting this patient to the stepdown unit.  I appreciate his collaboration in the care of this patient. [RS]    Clinical Course User Index [  RS] Iveliz Garay, Sharlene Dory   MDM Rules/Calculators/A&P                         55 year old female presents with intentional Tylenol PM overdose.  Hypertensive, tachycardic, and tachypneic on intake.  Patient gradually became more somnolent and is at this time obtunded.  She was administered a total of 4 mg of Narcan IV in the emergency department without improvement in her mental status.  Subsequently it was brought to light the patient overdosed on  Tylenol PM, suspect mental status change at this time is likely secondary to Benadryl intoxication.  Case discussed with poison control repeatedly as above.  Patient started on NAC and fomepizole per their recommendation.  Patient did subsequently began to hallucinate and was administered 1 of Ativan for agitation.  Patient remains unable to contribute any further to her history.  Per poison control will need serial EKGs to trend QTC back to <500.  Labs have been ordered per the recommendation for tomorrow at the 22-hour mark of the NAC administration.  Due to acuity of presentation upon arrival as well as sign of acetaminophen and Benadryl intoxication do feel patient would benefit from admission to the hospital for further stabilization.  Case was discussed with the patient's daughter who agrees with plan.  Patient unable to consent for admission at this time due to altered mental status.  Case discussed with Dr. Olevia Bowens, hospitalist as above and patient will be admitted to the stepdown unit.  This chart was dictated using voice recognition software, Dragon. Despite the best efforts of this provider to proofread and correct errors, errors may still occur which can change documentation meaning.  Final Clinical Impression(s) / ED Diagnoses Final diagnoses:  None    Rx / DC Orders ED Discharge Orders     None        Emeline Darling, PA-C 09/16/21 1247    Christobal Morado, Gypsy Balsam, PA-C 09/16/21 1247    Lacretia Leigh, MD 09/18/21 [phone]

## 2021-09-16 NOTE — ED Notes (Addendum)
Spoke with poison control Denise Knight, wants repeat EKG for qtc. If QTC is > 555m, may need to repeat K level. Acetaminophen level to draw 1200 on 09/17/2021. Per poison control, they will follow up in 4hrs.

## 2021-09-16 NOTE — Progress Notes (Signed)
MEDICATION RELATED CONSULT NOTE - INITIAL   Pharmacy Consult for Acetylcysteine Indication: acetaminophen overdose  Allergies  Allergen Reactions   Aspirin Hives   Food Hives    "Regular butter"   Morphine And Related Hives    Patient Measurements: Height: 5' 8"  (172.7 cm) Weight: 88.5 kg (195 lb) IBW/kg (Calculated) : 63.9   Vital Signs: Temp: 98.2 F (36.8 C) (11/06 0954) Temp Source: Oral (11/06 0926) BP: 94/65 (11/06 1430) Pulse Rate: 87 (11/06 1430)   Labs: Recent Labs    09/16/21 1014 09/16/21 1029  WBC 14.8*  --   HGB 12.7  --   HCT 39.0  --   PLT 354  --   CREATININE 1.35*  --   MG  --  2.2  ALBUMIN 4.1  --   PROT 7.9  --   AST 38  --   ALT 84*  --   ALKPHOS 78  --   BILITOT 1.2  --    Estimated Creatinine Clearance: 54.1 mL/min (A) (by C-G formula based on SCr of 1.35 mg/dL (H)).   Microbiology: Recent Results (from the past 720 hour(s))  Resp Panel by RT-PCR (Flu A&B, Covid) Nasopharyngeal Swab     Status: None   Collection Time: 09/02/21 12:36 AM   Specimen: Nasopharyngeal Swab; Nasopharyngeal(NP) swabs in vial transport medium  Result Value Ref Range Status   SARS Coronavirus 2 by RT PCR NEGATIVE NEGATIVE Final    Comment: (NOTE) SARS-CoV-2 target nucleic acids are NOT DETECTED.  The SARS-CoV-2 RNA is generally detectable in upper respiratory specimens during the acute phase of infection. The lowest concentration of SARS-CoV-2 viral copies this assay can detect is 138 copies/mL. A negative result does not preclude SARS-Cov-2 infection and should not be used as the sole basis for treatment or other patient management decisions. A negative result may occur with  improper specimen collection/handling, submission of specimen other than nasopharyngeal swab, presence of viral mutation(s) within the areas targeted by this assay, and inadequate number of viral copies(<138 copies/mL). A negative result must be combined with clinical observations,  patient history, and epidemiological information. The expected result is Negative.  Fact Sheet for Patients:  EntrepreneurPulse.com.au  Fact Sheet for Healthcare Providers:  IncredibleEmployment.be  This test is no t yet approved or cleared by the [place] FDA and  has been authorized for detection and/or diagnosis of SARS-CoV-2 by FDA under an Emergency Use Authorization (EUA). This EUA will remain  in effect (meaning this test can be used) for the duration of the COVID-19 declaration under Section 564(b)(1) of the Act, 21 U.S.C.section 360bbb-3(b)(1), unless the authorization is terminated  or revoked sooner.       Influenza A by PCR NEGATIVE NEGATIVE Final   Influenza B by PCR NEGATIVE NEGATIVE Final    Comment: (NOTE) The Xpert Xpress SARS-CoV-2/FLU/RSV plus assay is intended as an aid in the diagnosis of influenza from Nasopharyngeal swab specimens and should not be used as a sole basis for treatment. Nasal washings and aspirates are unacceptable for Xpert Xpress SARS-CoV-2/FLU/RSV testing.  Fact Sheet for Patients: EntrepreneurPulse.com.au  Fact Sheet for Healthcare Providers: IncredibleEmployment.be  This test is not yet approved or cleared by the [place] FDA and has been authorized for detection and/or diagnosis of SARS-CoV-2 by FDA under an Emergency Use Authorization (EUA). This EUA will remain in effect (meaning this test can be used) for the duration of the COVID-19 declaration under Section 564(b)(1) of the Act, 21 U.S.C. section 360bbb-3(b)(1), unless the  authorization is terminated or revoked.  Performed at [facility], [place] [address], [place], [place] [address]   Resp Panel by RT-PCR (Flu A&B, Covid) Nasopharyngeal Swab     Status: None   Collection Time: 09/16/21 10:45 AM   Specimen: Nasopharyngeal Swab; Nasopharyngeal(NP) swabs in vial transport medium  Result  Value Ref Range Status   SARS Coronavirus 2 by RT PCR NEGATIVE NEGATIVE Final    Comment: (NOTE) SARS-CoV-2 target nucleic acids are NOT DETECTED.  The SARS-CoV-2 RNA is generally detectable in upper respiratory specimens during the acute phase of infection. The lowest concentration of SARS-CoV-2 viral copies this assay can detect is 138 copies/mL. A negative result does not preclude SARS-Cov-2 infection and should not be used as the sole basis for treatment or other patient management decisions. A negative result may occur with  improper specimen collection/handling, submission of specimen other than nasopharyngeal swab, presence of viral mutation(s) within the areas targeted by this assay, and inadequate number of viral copies(<138 copies/mL). A negative result must be combined with clinical observations, patient history, and epidemiological information. The expected result is Negative.  Fact Sheet for Patients:  EntrepreneurPulse.com.au  Fact Sheet for Healthcare Providers:  IncredibleEmployment.be  This test is no t yet approved or cleared by the [place] FDA and  has been authorized for detection and/or diagnosis of SARS-CoV-2 by FDA under an Emergency Use Authorization (EUA). This EUA will remain  in effect (meaning this test can be used) for the duration of the COVID-19 declaration under Section 564(b)(1) of the Act, 21 U.S.C.section 360bbb-3(b)(1), unless the authorization is terminated  or revoked sooner.       Influenza A by PCR NEGATIVE NEGATIVE Final   Influenza B by PCR NEGATIVE NEGATIVE Final    Comment: (NOTE) The Xpert Xpress SARS-CoV-2/FLU/RSV plus assay is intended as an aid in the diagnosis of influenza from Nasopharyngeal swab specimens and should not be used as a sole basis for treatment. Nasal washings and aspirates are unacceptable for Xpert Xpress SARS-CoV-2/FLU/RSV testing.  Fact Sheet for  Patients: EntrepreneurPulse.com.au  Fact Sheet for Healthcare Providers: IncredibleEmployment.be  This test is not yet approved or cleared by the [place] FDA and has been authorized for detection and/or diagnosis of SARS-CoV-2 by FDA under an Emergency Use Authorization (EUA). This EUA will remain in effect (meaning this test can be used) for the duration of the COVID-19 declaration under Section 564(b)(1) of the Act, 21 U.S.C. section 360bbb-3(b)(1), unless the authorization is terminated or revoked.  Performed at [facility], [place] [address] [place], [place] [address]     Medical History: Past Medical History:  Diagnosis Date   Allergy    seasonal   Anxiety    Arthritis    "left knee" (07/05/2016)   Asthma    Bipolar 1 disorder ([place])    Depression    Diabetes mellitus without complication (HCC)    pre-   GERD (gastroesophageal reflux disease)    Hypertension    MI (mitral incompetence)    Post traumatic stress disorder    Schizophrenia ([place])    Stomach ulcer    Substance abuse ([place])    cocaine quit july 2020   Suicide attempt [facility])     Assessment: 5 y/oF who presents with intentional overdose of Tylenol PM. Per ED PA's discussion with Poison Control, acetylcysteine recommended as well as fomepizole 67m/kg IV x 1 as adjunctive agent.    AST/ALT 38/84, Tbili 1.2 PT/INR 13.7/1 Acetaminophen level  379 Salicylate level <7 Urine toxicology screen positive for cocaine   Plan:  Per ED PA's discussion with Poison Control, start Acetylcysteine 173m/kg IV x 1 loading dose, then 162mkg/hr Fomepizole 1565mg IV x 1 per Poison Control recommendations CMET, PT/INR, acetaminophen level 22 hours into maintenance therapy    JigLindell SparharmD, BCPS Clinical Pharmacist  09/16/2021 12:34 PM

## 2021-09-16 NOTE — ED Notes (Signed)
Patient is asleep

## 2021-09-16 NOTE — ED Provider Notes (Signed)
I provided a substantive portion of the care of this patient.  I personally performed the entirety of the medical decision making for this encounter.  EKG Interpretation  Date/Time:  Sunday September 16 2021 10:20:17 EST Ventricular Rate:  111 PR Interval:  140 QRS Duration: 101 QT Interval:  376 QTC Calculation: 511 R Axis:   3 Text Interpretation: Sinus tachycardia Inferior infarct, old Prolonged QT interval Baseline wander in lead(s) V2 V3 Partial missing lead(s): V2 Confirmed by Lacretia Leigh ([id number]) on 09/16/2021 10:49:82 AM   55 year old female here after intentional overdose.  She admits that this was a suicide attempt.  Will medically clear and have behavioral health see   Lacretia Leigh, MD 09/16/21 1057

## 2021-09-17 ENCOUNTER — Encounter (HOSPITAL_COMMUNITY): Payer: Self-pay

## 2021-09-17 DIAGNOSIS — T391X2A Poisoning by 4-Aminophenol derivatives, intentional self-harm, initial encounter: Secondary | ICD-10-CM

## 2021-09-17 LAB — HIV ANTIBODY (ROUTINE TESTING W REFLEX): HIV Screen 4th Generation wRfx: NONREACTIVE

## 2021-09-17 LAB — COMPREHENSIVE METABOLIC PANEL
ALT: 54 U/L — ABNORMAL HIGH (ref 0–44)
AST: 20 U/L (ref 15–41)
Albumin: 3.3 g/dL — ABNORMAL LOW (ref 3.5–5.0)
Alkaline Phosphatase: 59 U/L (ref 38–126)
Anion gap: 6 (ref 5–15)
BUN: 10 mg/dL (ref 6–20)
CO2: 22 mmol/L (ref 22–32)
Calcium: 8.6 mg/dL — ABNORMAL LOW (ref 8.9–10.3)
Chloride: 109 mmol/L (ref 98–111)
Creatinine, Ser: 1.06 mg/dL — ABNORMAL HIGH (ref 0.44–1.00)
GFR, Estimated: >60 mL/min (ref >60)
Glucose, Bld: 79 mg/dL (ref 70–99)
Potassium: 3.6 mmol/L (ref 3.5–5.1)
Sodium: 137 mmol/L (ref 135–145)
Total Bilirubin: 0.8 mg/dL (ref 0.3–1.2)
Total Protein: 6.6 g/dL (ref 6.5–8.1)

## 2021-09-17 LAB — GLUCOSE, CAPILLARY
Glucose-Capillary: 102 mg/dL — ABNORMAL HIGH (ref 70–99)
Glucose-Capillary: 103 mg/dL — ABNORMAL HIGH (ref 70–99)
Glucose-Capillary: 72 mg/dL (ref 70–99)
Glucose-Capillary: 92 mg/dL (ref 70–99)

## 2021-09-17 LAB — PROTIME-INR
INR: 1.1 (ref 0.8–1.2)
INR: 1.1 (ref 0.8–1.2)
Prothrombin Time: 14.5 seconds (ref 11.4–15.2)
Prothrombin Time: 14.6 seconds (ref 11.4–15.2)

## 2021-09-17 LAB — PHOSPHORUS: Phosphorus: 2.9 mg/dL (ref 2.5–4.6)

## 2021-09-17 LAB — ACETAMINOPHEN LEVEL: Acetaminophen (Tylenol), Serum: <10 ug/mL — ABNORMAL LOW (ref 10–30)

## 2021-09-17 LAB — MRSA NEXT GEN BY PCR, NASAL: MRSA by PCR Next Gen: DETECTED — AB

## 2021-09-17 MED ORDER — AMLODIPINE BESYLATE 10 MG PO TABS
10.0000 mg | ORAL_TABLET | Freq: Every day | ORAL | Status: DC
  Administered 2021-09-17 – 2021-09-18 (×2): 10 mg via ORAL
  Filled 2021-09-17 (×2): qty 1

## 2021-09-17 MED ORDER — ALBUTEROL SULFATE (2.5 MG/3ML) 0.083% IN NEBU
2.5000 mg | INHALATION_SOLUTION | Freq: Four times a day (QID) | RESPIRATORY_TRACT | Status: DC | PRN
  Administered 2021-09-17 – 2021-09-18 (×3): 2.5 mg via RESPIRATORY_TRACT
  Filled 2021-09-17 (×3): qty 3

## 2021-09-17 MED ORDER — GUAIFENESIN 100 MG/5ML PO LIQD
5.0000 mL | ORAL | Status: DC | PRN
  Administered 2021-09-17 – 2021-09-18 (×4): 5 mL via ORAL
  Filled 2021-09-17 (×5): qty 10

## 2021-09-17 MED ORDER — GUAIFENESIN 100 MG/5ML PO LIQD
5.0000 mL | ORAL | Status: DC | PRN
  Administered 2021-09-17: 5 mL via ORAL
  Filled 2021-09-17: qty 10

## 2021-09-17 MED ORDER — MUPIROCIN 2 % EX OINT
1.0000 "application " | TOPICAL_OINTMENT | Freq: Two times a day (BID) | CUTANEOUS | Status: DC
  Administered 2021-09-17 – 2021-09-18 (×4): 1 via NASAL
  Filled 2021-09-17 (×3): qty 22

## 2021-09-17 MED ORDER — SALINE SPRAY 0.65 % NA SOLN
1.0000 | NASAL | Status: DC | PRN
  Administered 2021-09-17: 1 via NASAL
  Filled 2021-09-17: qty 44

## 2021-09-17 MED ORDER — CHLORHEXIDINE GLUCONATE CLOTH 2 % EX PADS
6.0000 | MEDICATED_PAD | Freq: Every day | CUTANEOUS | Status: DC
  Administered 2021-09-17 – 2021-09-18 (×2): 6 via TOPICAL

## 2021-09-17 NOTE — Progress Notes (Signed)
MEDICATION RELATED CONSULT NOTE - INITIAL   Pharmacy Consult for Acetylcysteine Indication: acetaminophen overdose  Allergies  Allergen Reactions   Aspirin Hives   Food Hives    "Regular butter"   Morphine And Related Hives   Patient Measurements: Height: 5' 8"  (172.7 cm) Weight: 88.5 kg (195 lb) IBW/kg (Calculated) : 63.9  Vital Signs: Temp: 98.2 F (36.8 C) (11/07 1216) Temp Source: Oral (11/07 1216) BP: 109/79 (11/07 1100) Pulse Rate: 96 (11/07 1300)  Labs: Recent Labs    09/16/21 1014 09/16/21 1029 09/16/21 1949 09/17/21 0322 09/17/21 1314  WBC 14.8*  --   --   --   --   HGB 12.7  --   --   --   --   HCT 39.0  --   --   --   --   PLT 354  --   --   --   --   CREATININE 1.35*  --   --   --  1.06*  MG  --  2.2  --   --   --   PHOS  --   --  4.7* 2.9  --   ALBUMIN 4.1  --   --   --  3.3*  PROT 7.9  --   --   --  6.6  AST 38  --   --   --  20  ALT 84*  --   --   --  54*  ALKPHOS 78  --   --   --  59  BILITOT 1.2  --   --   --  0.8    Estimated Creatinine Clearance: 68.9 mL/min (A) (by C-G formula based on SCr of 1.06 mg/dL (H)).  Assessment: 12 y/oF who presents with intentional overdose of Tylenol PM. Per ED PA's discussion with Poison Control, acetylcysteine recommended as well as fomepizole 44m/kg IV x 1 as adjunctive agent.   Today, 09/17/2021:  22 hour labs - Acetaminophen level < 10 - AST/ALT improved 20/54, Tbili 0.8  Plan:  Poison Control recommends to d/c Acetylcysteine Dr MThereasa Solonotified of recommendations and agrees to d/c.    CGretta ArabPharmD, BCPS Clinical Pharmacist [facility] main pharmacy [phone]/05/2021 2:28 PM

## 2021-09-17 NOTE — Plan of Care (Signed)
  Problem: Education: Goal: Knowledge of General Education information will improve Description: Including pain rating scale, medication(s)/side effects and non-pharmacologic comfort measures Outcome: Not Progressing   Problem: Clinical Measurements: Goal: Respiratory complications will improve Outcome: Progressing   Problem: Coping: Goal: Level of anxiety will decrease Outcome: Not Progressing   Problem: Education: Goal: Ability to make informed decisions regarding treatment will improve Outcome: Not Progressing   Problem: Coping: Goal: Coping ability will improve Outcome: Not Progressing

## 2021-09-17 NOTE — Consult Note (Signed)
[facility] Consult   Reason for Consult: Suicide attempt by overdose Referring Physician:  Dr. Thereasa Solo Patient Identification: Denise Knight MRN:  [id number] Principal Diagnosis: Acetaminophen overdose, intentional self-harm, initial encounter [facility]) Diagnosis:  Principal Problem:   Acetaminophen overdose, intentional self-harm, initial encounter ([place]) Active Problems:   Prolonged QT interval   Diphenhydramine overdose   GERD (gastroesophageal reflux disease)   Hypertension   Elevated serum creatinine   Polysubstance abuse ([place])   Total Time spent with patient: 45 minutes  Subjective:   Denise Knight is a 55 y.o. female patient admitted with Tylenol overdose.  Patient was recently discharged from [facility] admission date(October 23 through November 4), in which she was treated for cocaine abuse, alcohol use disorder, cannabis use, severe depression, bipolar disorder type II, and prolonged QT interval.  Patient states that she pleaded with staff to get her placed in a rehab facility prior to discharge, as the likelihood of her using again will be increased.  Patient reports continuing to find herself in the cycle of substance abuse, describes it as tiring. "  I am tired of making mistakes."  She continues to endorse depressive symptoms that include anhedonia, hopelessness, worthlessness, guilty, fatigue, depression, insomnia, suicidal thoughts with a plan.  She denies any anxiety at this time.  She also denies any mania, delusions, paranoia.  She does not appear to be responding to internal and or external stimuli, however is very groggy and somewhat somnolent.  Patient does endorse ingesting Tylenol PM, in an attempt to end her life due to her repeated mistakes and ongoing substance abuse.  She is unable to contract for safety, and continues to endorse suicidal thoughts at this time.  She currently has a one-to-one Air cabin crew in  place.  Chart review shows her most recent admission was due to another suicide attempt when she ingested 20 Advil PM.  She also recently attempted suicide by ingestion of a large amount of crack cocaine.   HPI:  Denise Knight is a 55 y.o. female with medical history significant of seasonal allergies, anxiety, bipolar 1 disorder, depression, PTSD, schizophrenia, history of suicide attempt, substance abuse, tobacco use disorder, osteoarthritis, asthma, prediabetes, GERD, hypertension, mitral incompetence, PUD with stomach ulcer who was brought via EMS due to ingesting an unknown amount of Tylenol PM tablets with self-harm purposes.  She is currently somnolent and unable to provide further information  Past Psychiatric History: Depression, bipolar type II, PTSD.  History of multiple suicide attempts by overdose, most of her lethality that resulted in medical admission to the hospital.  She has previously taken multiple psychotropic medications to include lithium, Seroquel, Prozac.  She was recently discharged on Lamictal 25 mg p.o. daily, mirtazapine 15 mg p.o. nightly, risperidone 2 mg p.o. nightly.  Risk to Self: Yes Risk to Others:   Prior Inpatient Therapy: Recent admission to call behavioral health [facility] October 2022, [place], [employer]. Prior Outpatient Therapy: Denies  Past Medical History:  Past Medical History:  Diagnosis Date   Allergy    seasonal   Anxiety    Arthritis    "left knee" (07/05/2016)   Asthma    Bipolar 1 disorder ([place])    Depression    Diabetes mellitus without complication (HCC)    pre-   GERD (gastroesophageal reflux disease)    Hypertension    MI (mitral incompetence)    Post traumatic stress disorder    Schizophrenia ([place])    Stomach ulcer  Substance abuse ([place])    cocaine quit july 2020   Suicide attempt [facility])     Past Surgical History:  Procedure Laterality Date   CARDIAC CATHETERIZATION  07/05/2016   CARDIAC  CATHETERIZATION N/A 07/05/2016   Procedure: Left Heart Cath and Coronary Angiography;  Surgeon: Adrian Prows, MD;  Location: [facility];  Service: Cardiovascular;  Laterality: N/A;   CYST EXCISION Right    "wrist"   DILATION AND CURETTAGE OF UTERUS     FOOT SURGERY Bilateral    "corns removed"   LEFT HEART CATH AND CORONARY ANGIOGRAPHY N/A 12/24/2017   Procedure: LEFT HEART CATH AND CORONARY ANGIOGRAPHY;  Surgeon: Nigel Mormon, MD;  Location: [facility];  Service: Cardiovascular;  Laterality: N/A;   TUBAL LIGATION     Family History:  Family History  Problem Relation Age of Onset   Breast cancer Other    Lung cancer Other    Congestive Heart Failure Other    Diabetes Other    Hypertension Other    Colon cancer Neg Hx    Colon polyps Neg Hx    Esophageal cancer Neg Hx    Stomach cancer Neg Hx    Rectal cancer Neg Hx    Family Psychiatric  History: Substance abuse Social History:  Social History   Substance and Sexual Activity  Alcohol Use Yes   Alcohol/week: 7.0 standard drinks   Types: 7 Cans of beer per week     Social History   Substance and Sexual Activity  Drug Use Yes   Types: Marijuana    Social History   Socioeconomic History   Marital status: Widowed    Spouse name: Not on file   Number of children: 2   Years of education: Not on file   Highest education level: Not on file  Occupational History   Occupation: disabled   Tobacco Use   Smoking status: Some Days    Packs/day: 0.25    Years: 35.00    Pack years: 8.75    Types: Cigarettes    Last attempt to quit: 05/30/2019    Years since quitting: 2.3   Smokeless tobacco: Never  Vaping Use   Vaping Use: Never used  Substance and Sexual Activity   Alcohol use: Yes    Alcohol/week: 7.0 standard drinks    Types: 7 Cans of beer per week   Drug use: Yes    Types: Marijuana   Sexual activity: Yes    Birth control/protection: Surgical  Other Topics Concern   Not on file  Social  History Narrative   Not on file   Social Determinants of Health   Financial Resource Strain: Not on file  Food Insecurity: Not on file  Transportation Needs: Not on file  Physical Activity: Not on file  Stress: Not on file  Social Connections: Not on file   Additional Social History:    Allergies:   Allergies  Allergen Reactions   Aspirin Hives   Food Hives    "Regular butter"   Morphine And Related Hives    Labs:  Results for orders placed or performed during the hospital encounter of 09/16/21 (from the past 48 hour(s))  Comprehensive metabolic panel     Status: Abnormal   Collection Time: 09/16/21 10:14 AM  Result Value Ref Range   Sodium 135 135 - 145 mmol/L   Potassium 4.1 3.5 - 5.1 mmol/L   Chloride 102 98 - 111 mmol/L   CO2 22 22 -  32 mmol/L   Glucose, Bld 114 (H) 70 - 99 mg/dL    Comment: Glucose reference range applies only to samples taken after fasting for at least 8 hours.   BUN 16 6 - 20 mg/dL   Creatinine, Ser 1.35 (H) 0.44 - 1.00 mg/dL   Calcium 9.4 8.9 - 10.3 mg/dL   Total Protein 7.9 6.5 - 8.1 g/dL   Albumin 4.1 3.5 - 5.0 g/dL   AST 38 15 - 41 U/L   ALT 84 (H) 0 - 44 U/L   Alkaline Phosphatase 78 38 - 126 U/L   Total Bilirubin 1.2 0.3 - 1.2 mg/dL   GFR, Estimated 46 (L) >60 mL/min    Comment: (NOTE) Calculated using the CKD-EPI Creatinine Equation (2021)    Anion gap 11 5 - 15    Comment: Performed at [facility], [place] [address], [place], [place] [address]  Ethanol     Status: None   Collection Time: 09/16/21 10:14 AM  Result Value Ref Range   Alcohol, Ethyl (B) <10 <10 mg/dL    Comment: (NOTE) Lowest detectable limit for serum alcohol is 10 mg/dL.  For medical purposes only. Performed at [facility], [place] [address], [place], [place] [address]   CBC with Diff     Status: Abnormal   Collection Time: 09/16/21 10:14 AM  Result Value Ref Range   WBC 14.8 (H) 4.0 - 10.5 K/uL   RBC 4.72 3.87 - 5.11  MIL/uL   Hemoglobin 12.7 12.0 - 15.0 g/dL   HCT 39.0 36.0 - 46.0 %   MCV 82.6 80.0 - 100.0 fL   MCH 26.9 26.0 - 34.0 pg   MCHC 32.6 30.0 - 36.0 g/dL   RDW 17.0 (H) 11.5 - 15.5 %   Platelets 354 150 - 400 K/uL   nRBC 0.0 0.0 - 0.2 %   Neutrophils Relative % 49 %   Neutro Abs 7.4 1.7 - 7.7 K/uL   Lymphocytes Relative 34 %   Lymphs Abs 5.0 (H) 0.7 - 4.0 K/uL   Monocytes Relative 10 %   Monocytes Absolute 1.5 (H) 0.1 - 1.0 K/uL   Eosinophils Relative 5 %   Eosinophils Absolute 0.8 (H) 0.0 - 0.5 K/uL   Basophils Relative 1 %   Basophils Absolute 0.1 0.0 - 0.1 K/uL   RBC Morphology MORPHOLOGY UNREMARKABLE    Immature Granulocytes 1 %   Abs Immature Granulocytes 0.08 (H) 0.00 - 0.07 K/uL   Reactive, Benign Lymphocytes PRESENT    Platelet Morphology NORMAL     Comment: Performed at [facility], [place] [address], [place], [place] [address]  Acetaminophen level     Status: Abnormal   Collection Time: 09/16/21 10:14 AM  Result Value Ref Range   Acetaminophen (Tylenol), Serum 201 (HH) 10 - 30 ug/mL    Comment: CRITICAL RESULT CALLED TO, READ BACK BY AND VERIFIED WITH: SPECNER L. RN ON 09/16/2021 @ 1201 BY MECIAL J. (NOTE) Therapeutic concentrations vary significantly. A range of 10-30 ug/mL  may be an effective concentration for many patients. However, some  are best treated at concentrations outside of this range. Acetaminophen concentrations >150 ug/mL at 4 hours after ingestion  and >50 ug/mL at 12 hours after ingestion are often associated with  toxic reactions.  Performed at [facility], [place] [address], [place], [place] [address]   Salicylate level     Status: Abnormal   Collection Time: 09/16/21 10:14 AM  Result Value  Ref Range   Salicylate Lvl <2.5 (L) 7.0 - 30.0 mg/dL    Comment: Performed at [facility], [place] [address] [place], [place] [address]  Magnesium     Status: None   Collection Time: 09/16/21 10:29 AM   Result Value Ref Range   Magnesium 2.2 1.7 - 2.4 mg/dL    Comment: Performed at [facility], [place] [address] [place], [place] [address]  Resp Panel by RT-PCR (Flu A&B, Covid) Nasopharyngeal Swab     Status: None   Collection Time: 09/16/21 10:45 AM   Specimen: Nasopharyngeal Swab; Nasopharyngeal(NP) swabs in vial transport medium  Result Value Ref Range   SARS Coronavirus 2 by RT PCR NEGATIVE NEGATIVE    Comment: (NOTE) SARS-CoV-2 target nucleic acids are NOT DETECTED.  The SARS-CoV-2 RNA is generally detectable in upper respiratory specimens during the acute phase of infection. The lowest concentration of SARS-CoV-2 viral copies this assay can detect is 138 copies/mL. A negative result does not preclude SARS-Cov-2 infection and should not be used as the sole basis for treatment or other patient management decisions. A negative result may occur with  improper specimen collection/handling, submission of specimen other than nasopharyngeal swab, presence of viral mutation(s) within the areas targeted by this assay, and inadequate number of viral copies(<138 copies/mL). A negative result must be combined with clinical observations, patient history, and epidemiological information. The expected result is Negative.  Fact Sheet for Patients:  EntrepreneurPulse.com.au  Fact Sheet for Healthcare Providers:  IncredibleEmployment.be  This test is no t yet approved or cleared by the [place] FDA and  has been authorized for detection and/or diagnosis of SARS-CoV-2 by FDA under an Emergency Use Authorization (EUA). This EUA will remain  in effect (meaning this test can be used) for the duration of the COVID-19 declaration under Section 564(b)(1) of the Act, 21 U.S.C.section 360bbb-3(b)(1), unless the authorization is terminated  or revoked sooner.       Influenza A by PCR NEGATIVE NEGATIVE   Influenza B by PCR NEGATIVE NEGATIVE     Comment: (NOTE) The Xpert Xpress SARS-CoV-2/FLU/RSV plus assay is intended as an aid in the diagnosis of influenza from Nasopharyngeal swab specimens and should not be used as a sole basis for treatment. Nasal washings and aspirates are unacceptable for Xpert Xpress SARS-CoV-2/FLU/RSV testing.  Fact Sheet for Patients: EntrepreneurPulse.com.au  Fact Sheet for Healthcare Providers: IncredibleEmployment.be  This test is not yet approved or cleared by the [place] FDA and has been authorized for detection and/or diagnosis of SARS-CoV-2 by FDA under an Emergency Use Authorization (EUA). This EUA will remain in effect (meaning this test can be used) for the duration of the COVID-19 declaration under Section 564(b)(1) of the Act, 21 U.S.C. section 360bbb-3(b)(1), unless the authorization is terminated or revoked.  Performed at [facility], [place] [address], [place], [place] [address]   POC CBG, ED     Status: Abnormal   Collection Time: 09/16/21 11:06 AM  Result Value Ref Range   Glucose-Capillary 115 (H) 70 - 99 mg/dL    Comment: Glucose reference range applies only to samples taken after fasting for at least 8 hours.  POC CBG, ED     Status: None   Collection Time: 09/16/21  1:16 PM  Result Value Ref Range   Glucose-Capillary 79 70 - 99 mg/dL    Comment: Glucose reference range applies only to samples taken after fasting for at least 8 hours.  Blood gas, venous (  at [facility] and [facility], not at [facility])     Status: None   Collection Time: 09/16/21  1:50 PM  Result Value Ref Range   pH, Ven 7.310 7.250 - 7.430   pCO2, Ven 50.6 44.0 - 60.0 mmHg   pO2, Ven 34.2 32.0 - 45.0 mmHg   Bicarbonate 24.7 20.0 - 28.0 mmol/L   Acid-base deficit 1.5 0.0 - 2.0 mmol/L   O2 Saturation 56.7 %   Patient temperature 98.6     Comment: Performed at [facility], [place] [address], [place], [place] [address]  POC CBG, ED     Status:  None   Collection Time: 09/16/21  3:08 PM  Result Value Ref Range   Glucose-Capillary 90 70 - 99 mg/dL    Comment: Glucose reference range applies only to samples taken after fasting for at least 8 hours.  Urine rapid drug screen (hosp performed)     Status: Abnormal   Collection Time: 09/16/21  6:08 PM  Result Value Ref Range   Opiates NONE DETECTED NONE DETECTED   Cocaine POSITIVE (A) NONE DETECTED   Benzodiazepines NONE DETECTED NONE DETECTED   Amphetamines NONE DETECTED NONE DETECTED   Tetrahydrocannabinol NONE DETECTED NONE DETECTED   Barbiturates NONE DETECTED NONE DETECTED    Comment: (NOTE) DRUG SCREEN FOR MEDICAL PURPOSES ONLY.  IF CONFIRMATION IS NEEDED FOR ANY PURPOSE, NOTIFY LAB WITHIN 5 DAYS.  LOWEST DETECTABLE LIMITS FOR URINE DRUG SCREEN Drug Class                     Cutoff (ng/mL) Amphetamine and metabolites    1000 Barbiturate and metabolites    200 Benzodiazepine                 856 Tricyclics and metabolites     300 Opiates and metabolites        300 Cocaine and metabolites        300 THC                            50 Performed at [facility] [place] [address], [place], [place] [address]   POC CBG, ED     Status: Abnormal   Collection Time: 09/16/21  6:29 PM  Result Value Ref Range   Glucose-Capillary 115 (H) 70 - 99 mg/dL    Comment: Glucose reference range applies only to samples taken after fasting for at least 8 hours.  Phosphorus     Status: Abnormal   Collection Time: 09/16/21  7:49 PM  Result Value Ref Range   Phosphorus 4.7 (H) 2.5 - 4.6 mg/dL    Comment: Performed at [facility], [place] [address] [place], [place] [address]  hCG, serum, qualitative     Status: None   Collection Time: 09/16/21  7:49 PM  Result Value Ref Range   Preg, Serum NEGATIVE NEGATIVE    Comment:        THE SENSITIVITY OF THIS METHODOLOGY IS >10 mIU/mL. Performed at [facility], [place] [address], [place],  [place] [address]   Acetaminophen level     Status: None   Collection Time: 09/16/21  7:49 PM  Result Value Ref Range   Acetaminophen (Tylenol), Serum 11 10 - 30 ug/mL    Comment: (NOTE) Therapeutic concentrations vary significantly. A range of 10-30 ug/mL  may be an effective concentration for many patients. However, some  are best treated  at concentrations outside of this range. Acetaminophen concentrations >150 ug/mL at 4 hours after ingestion  and >50 ug/mL at 12 hours after ingestion are often associated with  toxic reactions.  Performed at [facility], [place] [address] [place], [place] [address]   Protime-INR     Status: None   Collection Time: 09/16/21  8:51 PM  Result Value Ref Range   Prothrombin Time 13.7 11.4 - 15.2 seconds   INR 1.0 0.8 - 1.2    Comment: (NOTE) INR goal varies based on device and disease states. Performed at [facility], [place] [address] [place], [place] [address]   POC CBG, ED     Status: Abnormal   Collection Time: 09/16/21 10:03 PM  Result Value Ref Range   Glucose-Capillary 100 (H) 70 - 99 mg/dL    Comment: Glucose reference range applies only to samples taken after fasting for at least 8 hours.  MRSA Next Gen by PCR, Nasal     Status: Abnormal   Collection Time: 09/17/21 12:15 AM   Specimen: Nasal Mucosa; Nasal Swab  Result Value Ref Range   MRSA by PCR Next Gen DETECTED (A) NOT DETECTED    Comment: RESULT CALLED TO, READ BACK BY AND VERIFIED WITH: GRAHAM,A RN @0250  ON 09/17/21 JACKSON,K (NOTE) The GeneXpert MRSA Assay (FDA approved for NASAL specimens only), is one component of a comprehensive MRSA colonization surveillance program. It is not intended to diagnose MRSA infection nor to guide or monitor treatment for MRSA infections. Test performance is not FDA approved in patients less than 35 years old. Performed at [facility], [place] [address], [place], [place] [address]   HIV Antibody  (routine testing w rflx)     Status: None   Collection Time: 09/17/21  3:22 AM  Result Value Ref Range   HIV Screen 4th Generation wRfx Non Reactive Non Reactive    Comment: Performed at [facility], [place] [address], [place], [place] [address]  Protime-INR     Status: None   Collection Time: 09/17/21  3:22 AM  Result Value Ref Range   Prothrombin Time 14.5 11.4 - 15.2 seconds   INR 1.1 0.8 - 1.2    Comment: (NOTE) INR goal varies based on device and disease states. Performed at [facility], [place] [address] [place], [place] [address]   Phosphorus     Status: None   Collection Time: 09/17/21  3:22 AM  Result Value Ref Range   Phosphorus 2.9 2.5 - 4.6 mg/dL    Comment: Performed at [facility], [place] [address] [place],  [address]  Glucose, capillary     Status: None   Collection Time: 09/17/21  4:45 AM  Result Value Ref Range   Glucose-Capillary 72 70 - 99 mg/dL    Comment: Glucose reference range applies only to samples taken after fasting for at least 8 hours.  Glucose, capillary     Status: None   Collection Time: 09/17/21 11:34 AM  Result Value Ref Range   Glucose-Capillary 92 70 - 99 mg/dL    Comment: Glucose reference range applies only to samples taken after fasting for at least 8 hours.   Comment 1 Notify RN    Comment 2 Document in Chart     Current Facility-Administered Medications  Medication Dose Route Frequency Provider Last Rate Last Admin   acetylcysteine (ACETADOTE) 18,000 mg in dextrose 5 % 590 mL (30.5085 mg/mL) infusion  15 mg/kg/hr  Intravenous Continuous Sponseller, Rebekah R, PA-C 43.5 mL/hr at 09/17/21 1238 15 mg/kg/hr at 09/17/21 1238   albuterol (PROVENTIL) (2.5 MG/3ML) 0.083% nebulizer solution 2.5 mg  2.5 mg Inhalation Q6H PRN Cherene Altes, MD   2.5 mg at 09/17/21 1031   Chlorhexidine Gluconate Cloth 2 % PADS 6 each  6 each Topical Q0600 Reubin Milan, MD   6 each at 09/17/21 0449    guaiFENesin (ROBITUSSIN) 100 MG/5ML liquid 5 mL  5 mL Oral Q4H PRN Cherene Altes, MD   5 mL at 09/17/21 1035   lactated ringers infusion   Intravenous Continuous Reubin Milan, MD 125 mL/hr at 09/17/21 1238 Infusion Verify at 09/17/21 1238   mupirocin ointment (BACTROBAN) 2 % 1 application  1 application Nasal BID Reubin Milan, MD   1 application at 94/50/38 1033   naloxone [facility]) injection 1 mg  1 mg Intravenous PRN Sponseller, Rebekah R, PA-C   2 mg at 09/16/21 1037   sodium chloride (OCEAN) 0.65 % nasal spray 1 spray  1 spray Each Nare PRN Reubin Milan, MD   1 spray at 09/17/21 0448    Musculoskeletal: Strength & Muscle Tone: within normal limits Gait & Station: normal Patient leans: N/A            Psychiatric Specialty Exam:  Presentation  General Appearance: Casual  Eye Contact:Fair  Speech:Clear and Coherent; Slow  Speech Volume:Decreased  Handedness:Right   Mood and Affect  Mood:Anxious; Depressed; Worthless; Hopeless  Affect:Depressed; Flat   Thought Process  Thought Processes:Linear; Coherent  Descriptions of Associations:Intact  Orientation:Full (Time, Place and Person)  Thought Content:Logical  History of Schizophrenia/Schizoaffective disorder:No  Duration of Psychotic Symptoms:No data recorded Hallucinations:Hallucinations: None  Ideas of Reference:None  Suicidal Thoughts:Suicidal Thoughts: Yes, Active SI Active Intent and/or Plan: With Intent; With Plan; With Means to [place]; With Access to Means  Homicidal Thoughts:Homicidal Thoughts: No   Sensorium  Memory:Immediate Fair; Recent Fair; Remote Fair  Judgment:Fair  Insight:Fair   Executive Functions  Concentration:Fair  Attention Span:Fair  [place]   Psychomotor Activity  Psychomotor Activity:Psychomotor Activity: Normal   Assets  Assets:Communication Skills; Desire for Improvement; Financial  Resources/Insurance; Housing; Social Support; Physical Health; Leisure Time   Sleep  Sleep:Sleep: Fair   Physical Exam: Physical Exam ROS Blood pressure 109/79, pulse 96, temperature 98.2 F (36.8 C), temperature source Oral, resp. rate (!) 21, height 5' 8"  (1.727 m), weight 88.5 kg, SpO2 97 %. Body mass index is 29.65 kg/m.  Treatment Plan Summary: Plan once medically stable, recommend resuming previous psychotropic medications.  Patient second attempt in 2 weeks of high lethality, both by overdose.  Patient currently has a one-to-one Air cabin crew in place.  At this time she appears to be willing to voluntarily presented to inpatient psych, in the event she refuses or attempts to leave patient will need to be placed under involuntary commitment.  However as noted she does appear to have some insight, into her substance abuse and need for ongoing treatment.   Disposition: Recommend psychiatric Inpatient admission when medically cleared.  Suella Broad, FNP 09/17/2021 1:24 PM

## 2021-09-17 NOTE — Progress Notes (Signed)
Denise Knight  BHA:[id number] DOB: Sep 13, 1966 DOA: 09/16/2021 PCP: Medicine, [facility]    Brief Narrative:  55yo with a history of anxiety disorder, bipolar 1 disorder, PTSD, prior suicide attempt, polysubstance abuse, osteoarthritis, prediabetes, HTN, mitral incompetence, PUD, and asthma who was brought to the ED via EMS after intentionally ingesting an unknown amount of Tylenol PM with the intent of self-harm.  On arrival she was somnolent and unable to provide a history.  Consultants:  None  Code Status: FULL CODE  Antimicrobials:  None  DVT prophylaxis: SCDs  Interval Hx / Events: Afebrile.  Vital signs stable.  Saturation 98% on room air. Resting comfortably in bed at the time my evaluation.  Denies shortness of breath chest pain nausea vomiting or abdominal pain.  Assessment & Plan:  Intentional acetaminophen overdose Continue Mucomyst per protocol -follow-up labs pending  Diphenhydramine overdose Monitoring QT segment -stable/improved on follow-up EKG  Suicide attempt Psychiatry consultation - anticipate inpatient psych tx after medically cleared   Prolonged QT Was dosed with magnesium sulfate - stable on f/u EKG  GERD Cont PPI  HTN Blood pressure currently well controlled  Acute kidney injury  Follow-up labs pending  Family Communication: No family present at time of exam today Status is: Inpatient  Objective: Blood pressure 116/83, pulse (!) 103, temperature 98.2 F (36.8 C), temperature source Oral, resp. rate 17, height 5' 8"  (1.727 m), weight 88.5 kg, SpO2 95 %.  Intake/Output Summary (Last 24 hours) at 09/17/2021 0749 Last data filed at 09/17/2021 0657 Gross per 24 hour  Intake 4158.26 ml  Output --  Net 4158.26 ml   Filed Weights   09/16/21 0933  Weight: 88.5 kg    Examination: General: No acute respiratory distress Lungs: Clear to auscultation bilaterally without wheezes or crackles Cardiovascular: Regular rate and  rhythm without murmur gallop or rub normal S1 and S2 Abdomen: Nontender, nondistended, soft, bowel sounds positive, no rebound, no ascites, no appreciable mass Extremities: No significant cyanosis, clubbing, or edema bilateral lower extremities  CBC: Recent Labs  Lab 09/16/21 1014  WBC 14.8*  NEUTROABS 7.4  HGB 12.7  HCT 39.0  MCV 82.6  PLT 973   Basic Metabolic Panel: Recent Labs  Lab 09/16/21 1014 09/16/21 1029 09/16/21 1949 09/17/21 0322  NA 135  --   --   --   K 4.1  --   --   --   CL 102  --   --   --   CO2 22  --   --   --   GLUCOSE 114*  --   --   --   BUN 16  --   --   --   CREATININE 1.35*  --   --   --   CALCIUM 9.4  --   --   --   MG  --  2.2  --   --   PHOS  --   --  4.7* 2.9   GFR: Estimated Creatinine Clearance: 54.1 mL/min (A) (by C-G formula based on SCr of 1.35 mg/dL (H)).  Liver Function Tests: Recent Labs  Lab 09/16/21 1014  AST 38  ALT 84*  ALKPHOS 78  BILITOT 1.2  PROT 7.9  ALBUMIN 4.1    Coagulation Profile: Recent Labs  Lab 09/16/21 2051 09/17/21 0322  INR 1.0 1.1    HbA1C: Hgb A1c MFr Bld  Date/Time Value Ref Range Status  09/03/2021 06:46 PM 5.7 (H) 4.8 - 5.6 % Final  Comment:    (NOTE) Pre diabetes:          5.7%-6.4%  Diabetes:              >6.4%  Glycemic control for   <7.0% adults with diabetes   01/13/2020 06:57 AM 5.7 (H) 4.8 - 5.6 % Final    Comment:    (NOTE) Pre diabetes:          5.7%-6.4% Diabetes:              >6.4% Glycemic control for   <7.0% adults with diabetes     CBG: Recent Labs  Lab 09/16/21 1316 09/16/21 1508 09/16/21 1829 09/16/21 2203 09/17/21 0445  GLUCAP 79 90 115* 100* 72    Recent Results (from the past 240 hour(s))  Resp Panel by RT-PCR (Flu A&B, Covid) Nasopharyngeal Swab     Status: None   Collection Time: 09/16/21 10:45 AM   Specimen: Nasopharyngeal Swab; Nasopharyngeal(NP) swabs in vial transport medium  Result Value Ref Range Status   SARS Coronavirus 2 by RT PCR  NEGATIVE NEGATIVE Final    Comment: (NOTE) SARS-CoV-2 target nucleic acids are NOT DETECTED.  The SARS-CoV-2 RNA is generally detectable in upper respiratory specimens during the acute phase of infection. The lowest concentration of SARS-CoV-2 viral copies this assay can detect is 138 copies/mL. A negative result does not preclude SARS-Cov-2 infection and should not be used as the sole basis for treatment or other patient management decisions. A negative result may occur with  improper specimen collection/handling, submission of specimen other than nasopharyngeal swab, presence of viral mutation(s) within the areas targeted by this assay, and inadequate number of viral copies(<138 copies/mL). A negative result must be combined with clinical observations, patient history, and epidemiological information. The expected result is Negative.  Fact Sheet for Patients:  EntrepreneurPulse.com.au  Fact Sheet for Healthcare Providers:  IncredibleEmployment.be  This test is no t yet approved or cleared by the [place] FDA and  has been authorized for detection and/or diagnosis of SARS-CoV-2 by FDA under an Emergency Use Authorization (EUA). This EUA will remain  in effect (meaning this test can be used) for the duration of the COVID-19 declaration under Section 564(b)(1) of the Act, 21 U.S.C.section 360bbb-3(b)(1), unless the authorization is terminated  or revoked sooner.       Influenza A by PCR NEGATIVE NEGATIVE Final   Influenza B by PCR NEGATIVE NEGATIVE Final    Comment: (NOTE) The Xpert Xpress SARS-CoV-2/FLU/RSV plus assay is intended as an aid in the diagnosis of influenza from Nasopharyngeal swab specimens and should not be used as a sole basis for treatment. Nasal washings and aspirates are unacceptable for Xpert Xpress SARS-CoV-2/FLU/RSV testing.  Fact Sheet for Patients: EntrepreneurPulse.com.au  Fact Sheet for  Healthcare Providers: IncredibleEmployment.be  This test is not yet approved or cleared by the [place] FDA and has been authorized for detection and/or diagnosis of SARS-CoV-2 by FDA under an Emergency Use Authorization (EUA). This EUA will remain in effect (meaning this test can be used) for the duration of the COVID-19 declaration under Section 564(b)(1) of the Act, 21 U.S.C. section 360bbb-3(b)(1), unless the authorization is terminated or revoked.  Performed at [facility], [place] [address], [place], [place] [address]   MRSA Next Gen by PCR, Nasal     Status: Abnormal   Collection Time: 09/17/21 12:15 AM   Specimen: Nasal Mucosa; Nasal Swab  Result Value Ref Range Status   MRSA by PCR Next  Gen DETECTED (A) NOT DETECTED Final    Comment: RESULT CALLED TO, READ BACK BY AND VERIFIED WITH: GRAHAM,A RN @0250  ON 09/17/21 JACKSON,K (NOTE) The GeneXpert MRSA Assay (FDA approved for NASAL specimens only), is one component of a comprehensive MRSA colonization surveillance program. It is not intended to diagnose MRSA infection nor to guide or monitor treatment for MRSA infections. Test performance is not FDA approved in patients less than 54 years old. Performed at [facility], [place] [address], [place], [place] [address]      Scheduled Meds:  Chlorhexidine Gluconate Cloth  6 each Topical Daily   Chlorhexidine Gluconate Cloth  6 each Topical Q0600   mupirocin ointment  1 application Nasal BID   Continuous Infusions:  acetylcysteine 15 mg/kg/hr (09/16/21 2252)   lactated ringers 125 mL/hr at 09/17/21 0728     LOS: 1 day   Cherene Altes, MD [facility] Office  [phone] Pager - Text Page per Shea Evans  If 7PM-7AM, please contact night-coverage per Amion 09/17/2021, 7:49 AM

## 2021-09-18 ENCOUNTER — Inpatient Hospital Stay (HOSPITAL_COMMUNITY)
Admission: AD | Admit: 2021-09-18 | Discharge: 2021-09-25 | DRG: 885 | Disposition: A | Discharge status: Home / Self Care | Source: Intra-hospital | Attending: Emergency Medicine | Admitting: Emergency Medicine

## 2021-09-18 DIAGNOSIS — F1211 Cannabis abuse, in remission: Secondary | ICD-10-CM | POA: Diagnosis present

## 2021-09-18 DIAGNOSIS — N179 Acute kidney failure, unspecified: Secondary | ICD-10-CM | POA: Diagnosis not present

## 2021-09-18 DIAGNOSIS — J45901 Unspecified asthma with (acute) exacerbation: Secondary | ICD-10-CM | POA: Diagnosis present

## 2021-09-18 DIAGNOSIS — F332 Major depressive disorder, recurrent severe without psychotic features: Secondary | ICD-10-CM | POA: Diagnosis present

## 2021-09-18 DIAGNOSIS — J45909 Unspecified asthma, uncomplicated: Secondary | ICD-10-CM | POA: Diagnosis present

## 2021-09-18 DIAGNOSIS — E785 Hyperlipidemia, unspecified: Secondary | ICD-10-CM | POA: Diagnosis present

## 2021-09-18 DIAGNOSIS — Z79899 Other long term (current) drug therapy: Secondary | ICD-10-CM | POA: Diagnosis not present

## 2021-09-18 DIAGNOSIS — R059 Cough, unspecified: Secondary | ICD-10-CM

## 2021-09-18 DIAGNOSIS — I252 Old myocardial infarction: Secondary | ICD-10-CM

## 2021-09-18 DIAGNOSIS — K5900 Constipation, unspecified: Secondary | ICD-10-CM | POA: Diagnosis present

## 2021-09-18 DIAGNOSIS — F1010 Alcohol abuse, uncomplicated: Secondary | ICD-10-CM | POA: Diagnosis present

## 2021-09-18 DIAGNOSIS — F1410 Cocaine abuse, uncomplicated: Secondary | ICD-10-CM | POA: Diagnosis present

## 2021-09-18 DIAGNOSIS — Z20822 Contact with and (suspected) exposure to covid-19: Secondary | ICD-10-CM | POA: Diagnosis present

## 2021-09-18 DIAGNOSIS — B9562 Methicillin resistant Staphylococcus aureus infection as the cause of diseases classified elsewhere: Secondary | ICD-10-CM | POA: Diagnosis present

## 2021-09-18 DIAGNOSIS — E559 Vitamin D deficiency, unspecified: Secondary | ICD-10-CM | POA: Diagnosis present

## 2021-09-18 DIAGNOSIS — F4310 Post-traumatic stress disorder, unspecified: Secondary | ICD-10-CM | POA: Diagnosis present

## 2021-09-18 DIAGNOSIS — R45851 Suicidal ideations: Secondary | ICD-10-CM | POA: Diagnosis present

## 2021-09-18 DIAGNOSIS — Z9151 Personal history of suicidal behavior: Secondary | ICD-10-CM | POA: Diagnosis not present

## 2021-09-18 DIAGNOSIS — Z833 Family history of diabetes mellitus: Secondary | ICD-10-CM

## 2021-09-18 DIAGNOSIS — G4700 Insomnia, unspecified: Secondary | ICD-10-CM | POA: Diagnosis present

## 2021-09-18 DIAGNOSIS — T450X2A Poisoning by antiallergic and antiemetic drugs, intentional self-harm, initial encounter: Secondary | ICD-10-CM | POA: Diagnosis not present

## 2021-09-18 DIAGNOSIS — F3181 Bipolar II disorder: Secondary | ICD-10-CM | POA: Diagnosis present

## 2021-09-18 DIAGNOSIS — I10 Essential (primary) hypertension: Secondary | ICD-10-CM | POA: Diagnosis present

## 2021-09-18 DIAGNOSIS — F17210 Nicotine dependence, cigarettes, uncomplicated: Secondary | ICD-10-CM | POA: Diagnosis present

## 2021-09-18 DIAGNOSIS — K219 Gastro-esophageal reflux disease without esophagitis: Secondary | ICD-10-CM | POA: Diagnosis present

## 2021-09-18 DIAGNOSIS — T391X2A Poisoning by 4-Aminophenol derivatives, intentional self-harm, initial encounter: Secondary | ICD-10-CM | POA: Diagnosis not present

## 2021-09-18 DIAGNOSIS — J45902 Unspecified asthma with status asthmaticus: Secondary | ICD-10-CM | POA: Diagnosis not present

## 2021-09-18 LAB — GLUCOSE, CAPILLARY
Glucose-Capillary: 72 mg/dL (ref 70–99)
Glucose-Capillary: 93 mg/dL (ref 70–99)

## 2021-09-18 LAB — COMPREHENSIVE METABOLIC PANEL
ALT: 48 U/L — ABNORMAL HIGH (ref 0–44)
AST: 19 U/L (ref 15–41)
Albumin: 3.4 g/dL — ABNORMAL LOW (ref 3.5–5.0)
Alkaline Phosphatase: 61 U/L (ref 38–126)
Anion gap: 6 (ref 5–15)
BUN: 12 mg/dL (ref 6–20)
CO2: 22 mmol/L (ref 22–32)
Calcium: 9 mg/dL (ref 8.9–10.3)
Chloride: 110 mmol/L (ref 98–111)
Creatinine, Ser: 1.02 mg/dL — ABNORMAL HIGH (ref 0.44–1.00)
GFR, Estimated: >60 mL/min (ref >60)
Glucose, Bld: 86 mg/dL (ref 70–99)
Potassium: 3.6 mmol/L (ref 3.5–5.1)
Sodium: 138 mmol/L (ref 135–145)
Total Bilirubin: 0.6 mg/dL (ref 0.3–1.2)
Total Protein: 6.6 g/dL (ref 6.5–8.1)

## 2021-09-18 LAB — RESP PANEL BY RT-PCR (FLU A&B, COVID) ARPGX2
Influenza A by PCR: NEGATIVE
Influenza B by PCR: NEGATIVE
SARS Coronavirus 2 by RT PCR: NEGATIVE

## 2021-09-18 MED ORDER — ACETAMINOPHEN 500 MG PO TABS: 1000.0000 mg | ORAL_TABLET | Freq: Four times a day (QID) | ORAL | Status: DC | PRN

## 2021-09-18 MED ORDER — AMLODIPINE BESYLATE 10 MG PO TABS
10.0000 mg | ORAL_TABLET | Freq: Every day | ORAL | Status: DC
  Administered 2021-09-19 – 2021-09-25 (×7): 10 mg via ORAL
  Filled 2021-09-18 (×8): qty 1

## 2021-09-18 MED ORDER — MAGNESIUM HYDROXIDE 400 MG/5ML PO SUSP
30.0000 mL | Freq: Every day | ORAL | Status: DC | PRN
  Administered 2021-09-20 – 2021-09-21 (×2): 30 mL via ORAL
  Filled 2021-09-18 (×2): qty 30

## 2021-09-18 MED ORDER — RISPERIDONE 2 MG PO TABS
2.0000 mg | ORAL_TABLET | Freq: Every day | ORAL | Status: DC
  Administered 2021-09-18 – 2021-09-19 (×2): 2 mg via ORAL
  Filled 2021-09-18 (×5): qty 1

## 2021-09-18 MED ORDER — LAMOTRIGINE 25 MG PO TABS
25.0000 mg | ORAL_TABLET | Freq: Every day | ORAL | Status: DC
  Administered 2021-09-19 – 2021-09-25 (×7): 25 mg via ORAL
  Filled 2021-09-18 (×9): qty 1

## 2021-09-18 MED ORDER — ALUM & MAG HYDROXIDE-SIMETH 200-200-20 MG/5ML PO SUSP
30.0000 mL | ORAL | Status: DC | PRN
  Administered 2021-09-22: 30 mL via ORAL
  Filled 2021-09-18: qty 30

## 2021-09-18 MED ORDER — PANTOPRAZOLE SODIUM 40 MG PO TBEC
40.0000 mg | DELAYED_RELEASE_TABLET | Freq: Every morning | ORAL | Status: DC
  Administered 2021-09-19 – 2021-09-23 (×5): 40 mg via ORAL
  Filled 2021-09-18 (×8): qty 1

## 2021-09-18 MED ORDER — ALBUTEROL SULFATE (2.5 MG/3ML) 0.083% IN NEBU
2.5000 mg | INHALATION_SOLUTION | Freq: Four times a day (QID) | RESPIRATORY_TRACT | Status: DC | PRN
  Administered 2021-09-18 – 2021-09-19 (×2): 2.5 mg via RESPIRATORY_TRACT
  Filled 2021-09-18 (×2): qty 3

## 2021-09-18 MED ORDER — GUAIFENESIN 100 MG/5ML PO LIQD: 5.0000 mL | ORAL | Status: DC | PRN

## 2021-09-18 MED ORDER — VITAMIN B-12 1000 MCG PO TABS
1000.0000 ug | ORAL_TABLET | Freq: Every day | ORAL | Status: DC
  Administered 2021-09-19 – 2021-09-25 (×7): 1000 ug via ORAL
  Filled 2021-09-18 (×10): qty 1

## 2021-09-18 MED ORDER — AMANTADINE HCL 100 MG PO CAPS
100.0000 mg | ORAL_CAPSULE | Freq: Two times a day (BID) | ORAL | Status: DC
  Administered 2021-09-18 – 2021-09-19 (×2): 100 mg via ORAL
  Filled 2021-09-18 (×7): qty 1

## 2021-09-18 MED ORDER — SIMVASTATIN 40 MG PO TABS
40.0000 mg | ORAL_TABLET | Freq: Every day | ORAL | Status: DC
  Administered 2021-09-19 – 2021-09-24 (×6): 40 mg via ORAL
  Filled 2021-09-18 (×4): qty 1
  Filled 2021-09-18: qty 2
  Filled 2021-09-18 (×3): qty 1

## 2021-09-18 MED ORDER — SALINE SPRAY 0.65 % NA SOLN: 1.0000 | NASAL | Status: DC | PRN

## 2021-09-18 MED ORDER — MIRTAZAPINE 15 MG PO TABS
15.0000 mg | ORAL_TABLET | Freq: Every day | ORAL | Status: DC
  Administered 2021-09-18 – 2021-09-24 (×7): 15 mg via ORAL
  Filled 2021-09-18 (×10): qty 1

## 2021-09-18 NOTE — Discharge Summary (Signed)
DISCHARGE SUMMARY  Denise Knight  MR#: [id number]  DOB:Oct 25, 1966  Date of Admission: 09/16/2021 Date of Discharge: 09/18/2021  Attending Physician:Izzak Fries Hennie Duos, MD  Patient's KVQ:[id number], [facility]  Consults: Psychiatry  Disposition: D/C to inpatient psychiatric facility    Follow-up Appts: To be determined at such time as the patient is cleared for discharge from her inpatient psychiatric facility.  No specific medical follow-up is required acutely.   Discharge Diagnoses: Intentional acetaminophen overdose Intentional diphenhydramine overdose Suicide attempt Prolonged QT as a consequence of toxic ingestion GERD HTN Acute kidney injury - resolved  Initial presentation: 55yo with a history of anxiety disorder, bipolar 1 disorder, PTSD, prior suicide attempt, polysubstance abuse, osteoarthritis, prediabetes, HTN, mitral incompetence, PUD, and asthma who was brought to the ED via EMS after intentionally ingesting an unknown amount of Tylenol PM with the intent of self-harm.  On arrival she was somnolent and unable to provide a history.  Hospital Course:  Intentional acetaminophen overdose Was dosed with Mucomyst per protocol - Mucomyst dosing was able to be discontinued when patient's LFTs showed a consistent downward trend/normalized and Tylenol level became undetectable - remains stable at time of d/c with no evidence of lasting deleterious effect   Diphenhydramine overdose Monitored QT segment closely with telemetry and serial EKG - stable/improved on follow-up EKG as confirmed again 11/8 AM - no need for ongoing telemetry monitoring   Suicide attempt Psychiatry consulted and recommended and arranged for inpatient psych tx after medically cleared -the patient is to be transferred directly to her inpatient psychiatric facility   Prolonged QT Was dosed with magnesium sulfate - stable on f/u EKG as discussed above   GERD Cont PPI    HTN Blood pressure well controlled during his hospital stay  Acute kidney injury  Resolved with volume resuscitation -creatinine 1.35 at presentation and 1.02 at time of discharge    Allergies as of 09/18/2021       Reactions   Aspirin Hives   Food Hives   "Regular butter"   Morphine And Related Hives        Medication List     STOP taking these medications    acetaminophen 500 MG tablet Commonly known as: TYLENOL   lamoTRIgine 25 MG tablet Commonly known as: LAMICTAL   mirtazapine 15 MG tablet Commonly known as: REMERON   mupirocin 2% oint-hydrocortisone 2.5% cream-nystatin cream-zinc oxide 13% oint 1:1:1:5 mixture   nystatin powder Commonly known as: MYCOSTATIN/NYSTOP   pantoprazole 40 MG tablet Commonly known as: PROTONIX   risperiDONE 2 MG tablet Commonly known as: RISPERDAL   simvastatin 40 MG tablet Commonly known as: ZOCOR   VITAMIN B-12 PO   VITAMIN D3 PO       TAKE these medications    albuterol 108 (90 Base) MCG/ACT inhaler Commonly known as: VENTOLIN HFA Inhale 1-2 puffs into the lungs every 6 (six) hours as needed for wheezing or shortness of breath.   amLODipine 10 MG tablet Commonly known as: NORVASC Take 10 mg by mouth every morning.   budesonide 0.25 MG/2ML nebulizer solution Commonly known as: PULMICORT Take 2 mLs (0.25 mg total) by nebulization 2 (two) times daily.        Day of Discharge BP 119/73 (BP Location: Left Arm)   Pulse (!) 103   Temp 98.5 F (36.9 C) (Oral)   Resp 18   Ht 5' 8"  (1.727 m)   Wt 88.5 kg   SpO2 97%   BMI 29.65 kg/m  Physical Exam: General: No acute respiratory distress Lungs: Clear to auscultation bilaterally without wheezes or crackles Cardiovascular: Regular rate and rhythm without murmur gallop or rub normal S1 and S2 Abdomen: Nontender, nondistended, soft, bowel sounds positive, no rebound, no ascites, no appreciable mass Extremities: No significant cyanosis, clubbing, or edema  bilateral lower extremities  Basic Metabolic Panel: Recent Labs  Lab 09/16/21 1014 09/16/21 1029 09/16/21 1949 09/17/21 0322 09/17/21 1314 09/18/21 0245  NA 135  --   --   --  137 138  K 4.1  --   --   --  3.6 3.6  CL 102  --   --   --  109 110  CO2 22  --   --   --  22 22  GLUCOSE 114*  --   --   --  79 86  BUN 16  --   --   --  10 12  CREATININE 1.35*  --   --   --  1.06* 1.02*  CALCIUM 9.4  --   --   --  8.6* 9.0  MG  --  2.2  --   --   --   --   PHOS  --   --  4.7* 2.9  --   --     Liver Function Tests: Recent Labs  Lab 09/16/21 1014 09/17/21 1314 09/18/21 0245  AST 38 20 19  ALT 84* 54* 48*  ALKPHOS 78 59 61  BILITOT 1.2 0.8 0.6  PROT 7.9 6.6 6.6  ALBUMIN 4.1 3.3* 3.4*    Coags: Recent Labs  Lab 09/16/21 2051 09/17/21 0322 09/17/21 1314  INR 1.0 1.1 1.1    CBC: Recent Labs  Lab 09/16/21 1014  WBC 14.8*  NEUTROABS 7.4  HGB 12.7  HCT 39.0  MCV 82.6  PLT 354    CBG: Recent Labs  Lab 09/17/21 1134 09/17/21 1753 09/17/21 2337 09/18/21 0602 09/18/21 1114  GLUCAP 92 102* 103* 72 93    Recent Results (from the past 240 hour(s))  Resp Panel by RT-PCR (Flu A&B, Covid) Nasopharyngeal Swab     Status: None   Collection Time: 09/16/21 10:45 AM   Specimen: Nasopharyngeal Swab; Nasopharyngeal(NP) swabs in vial transport medium  Result Value Ref Range Status   SARS Coronavirus 2 by RT PCR NEGATIVE NEGATIVE Final    Comment: (NOTE) SARS-CoV-2 target nucleic acids are NOT DETECTED.  The SARS-CoV-2 RNA is generally detectable in upper respiratory specimens during the acute phase of infection. The lowest concentration of SARS-CoV-2 viral copies this assay can detect is 138 copies/mL. A negative result does not preclude SARS-Cov-2 infection and should not be used as the sole basis for treatment or other patient management decisions. A negative result may occur with  improper specimen collection/handling, submission of specimen other than  nasopharyngeal swab, presence of viral mutation(s) within the areas targeted by this assay, and inadequate number of viral copies(<138 copies/mL). A negative result must be combined with clinical observations, patient history, and epidemiological information. The expected result is Negative.  Fact Sheet for Patients:  EntrepreneurPulse.com.au  Fact Sheet for Healthcare Providers:  IncredibleEmployment.be  This test is no t yet approved or cleared by the [place] FDA and  has been authorized for detection and/or diagnosis of SARS-CoV-2 by FDA under an Emergency Use Authorization (EUA). This EUA will remain  in effect (meaning this test can be used) for the duration of the COVID-19 declaration under Section 564(b)(1) of the Act, 21 U.S.C.section 360bbb-3(b)(1),  unless the authorization is terminated  or revoked sooner.       Influenza A by PCR NEGATIVE NEGATIVE Final   Influenza B by PCR NEGATIVE NEGATIVE Final    Comment: (NOTE) The Xpert Xpress SARS-CoV-2/FLU/RSV plus assay is intended as an aid in the diagnosis of influenza from Nasopharyngeal swab specimens and should not be used as a sole basis for treatment. Nasal washings and aspirates are unacceptable for Xpert Xpress SARS-CoV-2/FLU/RSV testing.  Fact Sheet for Patients: EntrepreneurPulse.com.au  Fact Sheet for Healthcare Providers: IncredibleEmployment.be  This test is not yet approved or cleared by the [place] FDA and has been authorized for detection and/or diagnosis of SARS-CoV-2 by FDA under an Emergency Use Authorization (EUA). This EUA will remain in effect (meaning this test can be used) for the duration of the COVID-19 declaration under Section 564(b)(1) of the Act, 21 U.S.C. section 360bbb-3(b)(1), unless the authorization is terminated or revoked.  Performed at [facility], [place] [address], [place], [place] [address]   MRSA Next Gen by PCR, Nasal     Status: Abnormal   Collection Time: 09/17/21 12:15 AM   Specimen: Nasal Mucosa; Nasal Swab  Result Value Ref Range Status   MRSA by PCR Next Gen DETECTED (A) NOT DETECTED Final    Comment: RESULT CALLED TO, READ BACK BY AND VERIFIED WITH: GRAHAM,A RN @0250  ON 09/17/21 JACKSON,K (NOTE) The GeneXpert MRSA Assay (FDA approved for NASAL specimens only), is one component of a comprehensive MRSA colonization surveillance program. It is not intended to diagnose MRSA infection nor to guide or monitor treatment for MRSA infections. Test performance is not FDA approved in patients less than 29 years old. Performed at [facility], [place] [address], [place],  [address]   Resp Panel by RT-PCR (Flu A&B, Covid) Nasopharyngeal Swab     Status: None   Collection Time: 09/18/21 12:54 PM   Specimen: Nasopharyngeal Swab; Nasopharyngeal(NP) swabs in vial transport medium  Result Value Ref Range Status   SARS Coronavirus 2 by RT PCR NEGATIVE NEGATIVE Final    Comment: (NOTE) SARS-CoV-2 target nucleic acids are NOT DETECTED.  The SARS-CoV-2 RNA is generally detectable in upper respiratory specimens during the acute phase of infection. The lowest concentration of SARS-CoV-2 viral copies this assay can detect is 138 copies/mL. A negative result does not preclude SARS-Cov-2 infection and should not be used as the sole basis for treatment or other patient management decisions. A negative result may occur with  improper specimen collection/handling, submission of specimen other than nasopharyngeal swab, presence of viral mutation(s) within the areas targeted by this assay, and inadequate number of viral copies(<138 copies/mL). A negative result must be combined with clinical observations, patient history, and epidemiological information. The expected result is Negative.  Fact Sheet for Patients:   EntrepreneurPulse.com.au  Fact Sheet for Healthcare Providers:  IncredibleEmployment.be  This test is no t yet approved or cleared by the [place] FDA and  has been authorized for detection and/or diagnosis of SARS-CoV-2 by FDA under an Emergency Use Authorization (EUA). This EUA will remain  in effect (meaning this test can be used) for the duration of the COVID-19 declaration under Section 564(b)(1) of the Act, 21 U.S.C.section 360bbb-3(b)(1), unless the authorization is terminated  or revoked sooner.       Influenza A by PCR NEGATIVE NEGATIVE Final   Influenza B by PCR NEGATIVE NEGATIVE Final    Comment: (NOTE) The Xpert Xpress SARS-CoV-2/FLU/RSV plus assay is intended as  an aid in the diagnosis of influenza from Nasopharyngeal swab specimens and should not be used as a sole basis for treatment. Nasal washings and aspirates are unacceptable for Xpert Xpress SARS-CoV-2/FLU/RSV testing.  Fact Sheet for Patients: EntrepreneurPulse.com.au  Fact Sheet for Healthcare Providers: IncredibleEmployment.be  This test is not yet approved or cleared by the [place] FDA and has been authorized for detection and/or diagnosis of SARS-CoV-2 by FDA under an Emergency Use Authorization (EUA). This EUA will remain in effect (meaning this test can be used) for the duration of the COVID-19 declaration under Section 564(b)(1) of the Act, 21 U.S.C. section 360bbb-3(b)(1), unless the authorization is terminated or revoked.  Performed at [facility], [place] [address] [place], [place] [address]      Time spent in discharge (includes decision making & examination of pt): 35 minutes  09/18/2021, 2:16 PM   Cherene Altes, MD [facility] Office  [phone]

## 2021-09-18 NOTE — TOC Progression Note (Signed)
Transition of Care [facility]) - Progression Note    Patient Details  Name: Denise Knight MRN: [id number] Date of Birth: 1966-09-15  Transition of Care [facility]) CM/SW Contact  Ross Ludwig, [place] Phone Number: 09/18/2021, 12:01 PM  Clinical Narrative:     CSW was informed that psych saw patient and is recommending inpatient.  Per psych a bed may be available today, awaiting confirmation from the [facility].  At this time patient is currently willing to go voluntary.  Once bed is confirmed, CSW to arrange safe transport to get patient to [facility].       Expected Discharge Plan and Services                                                 Social Determinants of Health (SDOH) Interventions    Readmission Risk Interventions No flowsheet data found.

## 2021-09-18 NOTE — Progress Notes (Signed)
Called [employer] and setup transport to [facility] for [employer].  Report called to [facility] RN.  All questions answered and notified RN that she may call be back with any additional concerns.  Patient paper chart and belongings at nurses station ready for transport with patient.

## 2021-09-18 NOTE — Consult Note (Signed)
Patient continues to meet inpatient psychiatric criteria, secondary to recent suicide attempt with high potential lethality.  Ingestion of most recently.  Patient has been medically cleared and is now stable for transfer to inpatient psychiatric facility.  Patient is under review at [facility].

## 2021-09-18 NOTE — TOC Transition Note (Signed)
Transition of Care [facility]) - CM/SW Discharge Note   Patient Details  Name: Denise Knight MRN: [id number] Date of Birth: 01/03/1966  Transition of Care [facility]) CM/SW Contact:  Ross Ludwig, LCSW Phone Number: 09/18/2021, 3:45 PM   Clinical Narrative:     CSW was informed that patient will need a [place] bed.  Per [facility], they have a bed available for patient and can accept her this evening.  At this time patient is agreeable to going to [facility] under Voluntary admissions.  Patient signed voluntary admission paperwork, and it was faxed to [facility].  [place] confirmed receipt of consent form, and can accept patient at [place].  CSW attempted to set up transportation through [employer], however, it can not be scheduled until after 6pm.  CSW updated bedside nurse and provided her with the phone number to call [phone].  Per [facility], make sure original voluntary consent form goes with patient upon discharge from [facility].  CSW signing off, no further needs or concerns.    Final next level of care: Psychiatric Hospital Barriers to Discharge: Barriers Resolved   Patient Goals and CMS Choice Patient states their goals for this hospitalization and ongoing recovery are:: To go to [facility] and then return back home. CMS Medicare.gov Compare Post Acute Care list provided to:: Patient Choice offered to / list presented to : Patient  Discharge Placement                       Discharge Plan and Services                                     Social Determinants of Health (SDOH) Interventions     Readmission Risk Interventions No flowsheet data found.

## 2021-09-18 NOTE — Progress Notes (Signed)
Patient was accompany by w/c and released to [employer] employees for transportation to [facility] facility for inpatient rehab. No complaints or concern at the time of discharge. Report called to the facility by previous shift employee. A;; resident belonging was release with the patient.

## 2021-09-18 NOTE — Progress Notes (Signed)
Psychoeducational Group Note  Date:  09/18/2021 Time:  2227  Group Topic/Focus:  Wrap-Up Group:   The focus of this group is to help patients review their daily goal of treatment and discuss progress on daily workbooks.  Participation Level: Did Not Attend  Participation Quality:  Not Applicable  Affect:  Not Applicable  Cognitive:  Not Applicable  Insight:  Not Applicable  Engagement in Group: Not Applicable  Additional Comments:  The patient was admitted to the hallway after the group concluded.   [name] S 09/18/2021, 10:27 PM

## 2021-09-19 ENCOUNTER — Encounter (HOSPITAL_COMMUNITY): Payer: Self-pay

## 2021-09-19 ENCOUNTER — Encounter (HOSPITAL_COMMUNITY): Payer: Self-pay | Admitting: Family

## 2021-09-19 ENCOUNTER — Other Ambulatory Visit: Payer: Self-pay

## 2021-09-19 DIAGNOSIS — F3181 Bipolar II disorder: Principal | ICD-10-CM

## 2021-09-19 DIAGNOSIS — J45902 Unspecified asthma with status asthmaticus: Secondary | ICD-10-CM

## 2021-09-19 LAB — COMPREHENSIVE METABOLIC PANEL
ALT: 36 U/L (ref 0–44)
AST: 17 U/L (ref 15–41)
Albumin: 3.9 g/dL (ref 3.5–5.0)
Alkaline Phosphatase: 66 U/L (ref 38–126)
Anion gap: 6 (ref 5–15)
BUN: 12 mg/dL (ref 6–20)
CO2: 26 mmol/L (ref 22–32)
Calcium: 9.4 mg/dL (ref 8.9–10.3)
Chloride: 105 mmol/L (ref 98–111)
Creatinine, Ser: 1.1 mg/dL — ABNORMAL HIGH (ref 0.44–1.00)
GFR, Estimated: 59 mL/min — ABNORMAL LOW (ref >60)
Glucose, Bld: 101 mg/dL — ABNORMAL HIGH (ref 70–99)
Potassium: 4.2 mmol/L (ref 3.5–5.1)
Sodium: 137 mmol/L (ref 135–145)
Total Bilirubin: 0.4 mg/dL (ref 0.3–1.2)
Total Protein: 7.5 g/dL (ref 6.5–8.1)

## 2021-09-19 LAB — CBC WITH DIFFERENTIAL/PLATELET
Abs Immature Granulocytes: 0.03 10*3/uL (ref 0.00–0.07)
Basophils Absolute: 0.1 10*3/uL (ref 0.0–0.1)
Basophils Relative: 1 %
Eosinophils Absolute: 0.6 10*3/uL — ABNORMAL HIGH (ref 0.0–0.5)
Eosinophils Relative: 7 %
HCT: 36.8 % (ref 36.0–46.0)
Hemoglobin: 11.4 g/dL — ABNORMAL LOW (ref 12.0–15.0)
Immature Granulocytes: 0 %
Lymphocytes Relative: 29 %
Lymphs Abs: 2.4 10*3/uL (ref 0.7–4.0)
MCH: 27 pg (ref 26.0–34.0)
MCHC: 31 g/dL (ref 30.0–36.0)
MCV: 87 fL (ref 80.0–100.0)
Monocytes Absolute: 0.6 10*3/uL (ref 0.1–1.0)
Monocytes Relative: 7 %
Neutro Abs: 4.7 10*3/uL (ref 1.7–7.7)
Neutrophils Relative %: 56 %
Platelets: 320 10*3/uL (ref 150–400)
RBC: 4.23 MIL/uL (ref 3.87–5.11)
RDW: 17.6 % — ABNORMAL HIGH (ref 11.5–15.5)
WBC: 8.3 10*3/uL (ref 4.0–10.5)
nRBC: 0 % (ref 0.0–0.2)

## 2021-09-19 MED ORDER — FLUTICASONE PROPIONATE HFA 110 MCG/ACT IN AERO
1.0000 | INHALATION_SPRAY | Freq: Two times a day (BID) | RESPIRATORY_TRACT | Status: DC
  Administered 2021-09-19 – 2021-09-24 (×8): 1 via RESPIRATORY_TRACT
  Filled 2021-09-19 (×2): qty 12

## 2021-09-19 MED ORDER — MUPIROCIN 2 % EX OINT
TOPICAL_OINTMENT | Freq: Two times a day (BID) | CUTANEOUS | Status: DC
  Administered 2021-09-19: 1 via NASAL
  Filled 2021-09-19 (×2): qty 22

## 2021-09-19 MED ORDER — ADULT MULTIVITAMIN W/MINERALS CH
1.0000 | ORAL_TABLET | Freq: Every day | ORAL | Status: DC
  Administered 2021-09-19 – 2021-09-25 (×7): 1 via ORAL
  Filled 2021-09-19 (×10): qty 1

## 2021-09-19 MED ORDER — HYDROXYZINE HCL 25 MG PO TABS
25.0000 mg | ORAL_TABLET | Freq: Four times a day (QID) | ORAL | Status: DC | PRN
  Administered 2021-09-19 – 2021-09-21 (×4): 25 mg via ORAL
  Filled 2021-09-19 (×4): qty 1

## 2021-09-19 MED ORDER — NICOTINE POLACRILEX 2 MG MT GUM: 2.0000 mg | CHEWING_GUM | OROMUCOSAL | Status: DC | PRN

## 2021-09-19 MED ORDER — PREDNISONE 20 MG PO TABS
40.0000 mg | ORAL_TABLET | Freq: Every day | ORAL | Status: AC
  Administered 2021-09-19 – 2021-09-23 (×5): 40 mg via ORAL
  Filled 2021-09-19 (×7): qty 2

## 2021-09-19 MED ORDER — ONDANSETRON 4 MG PO TBDP
4.0000 mg | ORAL_TABLET | Freq: Four times a day (QID) | ORAL | Status: AC | PRN
  Filled 2021-09-19: qty 1

## 2021-09-19 MED ORDER — MELATONIN 5 MG PO TABS
5.0000 mg | ORAL_TABLET | Freq: Every day | ORAL | Status: DC
Start: 2021-09-19 — End: 2021-09-23
  Administered 2021-09-19 – 2021-09-22 (×4): 5 mg via ORAL
  Filled 2021-09-19 (×7): qty 1

## 2021-09-19 MED ORDER — ALBUTEROL SULFATE HFA 108 (90 BASE) MCG/ACT IN AERS
1.0000 | INHALATION_SPRAY | Freq: Four times a day (QID) | RESPIRATORY_TRACT | Status: DC | PRN
Start: 2021-09-19 — End: 2021-09-25

## 2021-09-19 MED ORDER — LORAZEPAM 1 MG PO TABS: 1.0000 mg | ORAL_TABLET | Freq: Four times a day (QID) | ORAL | Status: DC | PRN

## 2021-09-19 MED ORDER — FOLIC ACID 1 MG PO TABS
1.0000 mg | ORAL_TABLET | Freq: Every day | ORAL | Status: DC
  Administered 2021-09-20 – 2021-09-25 (×6): 1 mg via ORAL
  Filled 2021-09-19 (×8): qty 1

## 2021-09-19 MED ORDER — THIAMINE HCL 100 MG PO TABS
100.0000 mg | ORAL_TABLET | Freq: Every day | ORAL | Status: DC
  Administered 2021-09-20 – 2021-09-25 (×5): 100 mg via ORAL
  Filled 2021-09-19 (×8): qty 1

## 2021-09-19 MED ORDER — BUDESONIDE 0.25 MG/2ML IN SUSP
0.2500 mg | Freq: Two times a day (BID) | RESPIRATORY_TRACT | Status: DC | PRN
  Filled 2021-09-19: qty 2

## 2021-09-19 MED ORDER — LOPERAMIDE HCL 2 MG PO CAPS: 2.0000 mg | ORAL_CAPSULE | ORAL | Status: AC | PRN

## 2021-09-19 MED ORDER — LORATADINE 10 MG PO TABS
10.0000 mg | ORAL_TABLET | Freq: Every day | ORAL | Status: DC
  Administered 2021-09-19 – 2021-09-25 (×7): 10 mg via ORAL
  Filled 2021-09-19 (×10): qty 1

## 2021-09-19 MED ORDER — VITAMIN D (ERGOCALCIFEROL) 1.25 MG (50000 UNIT) PO CAPS
50000.0000 [IU] | ORAL_CAPSULE | ORAL | Status: DC
  Administered 2021-09-19: 50000 [IU] via ORAL
  Filled 2021-09-19 (×4): qty 1

## 2021-09-19 NOTE — Group Note (Signed)
LCSW Group Therapy Note   Group Date: 09/19/2021 Start Time: 1300 End Time: 1400   Type of Therapy and Topic:  Group Therapy:   Participation Level:  Did Not Attend  Description of Group:Group encouraged increased engagement and participation through discussion focused on Safety Planning. Patients worked both individually and collaboratively to create and discuss the different elements of a safety plan, including identifying warning signs, coping skills, professional supports, people you can ask for help, how to make the environment safe, and reasons for life worth living. Remainder of group was spent filling out individual safety plans to be placed in patient charts.   Therapeutic Goal(s): Identify warning signs and triggers Identify positive coping strategies Identify professional and personal supports when experiencing a mental health crisis Identify ways in which you can make the environment safe Identify reasons for life worth living Identify the steps to completing a safety plan and provide education on completing a safety plan at discharge   Summary of Patient Progress:  Did Not Attend   Mliss Fritz, Latanya Presser 09/19/2021  1:28 PM

## 2021-09-19 NOTE — BHH Suicide Risk Assessment (Signed)
[facility] Admission Suicide Risk Assessment   Nursing information obtained from:  Patient Demographic factors:  Low socioeconomic status, Living alone Current Mental Status:  Suicidal ideation indicated by patient - suicide attempt prior to admission Loss Factors:  NA Historical Factors:  Prior suicide attempts, substance use prior to admission Risk Reduction Factors:  Sense of responsibility to family  Total Time Spent in Direct Patient Care:  I personally spent 45 minutes on the unit in direct patient care. The direct patient care time included face-to-face time with the patient, reviewing the patient's chart, communicating with other professionals, and coordinating care. Greater than 50% of this time was spent in counseling or coordinating care with the patient regarding goals of hospitalization, psycho-education, and discharge planning needs.  Principal Problem: Bipolar II disorder, most recent episode major depressive ([place]) Diagnosis:  Principal Problem:   Bipolar II disorder, most recent episode major depressive ([place]) Active Problems:   Cocaine abuse ([place])  Subjective Data: The patient is a 55y/o female with h/o bipolar II, PTSD, and polysubstance abuse, who was medically admitted on 09/16/21 after a Tylenol and benadryl overdose in a suicide attempt. She was medically treated with Mucomyst and she was treated for AKI and monitored for QTC prolongation. Once medically cleared, she was transferred to [facility] for continued management.   The patient has a past h/o previous suicide attempts including recent ingestion of Advil PM and a large amount of crack cocaine prior to her most recent admission to [facility] from October 23-Nov 4. During that admission, she was treated for bipolar depression with psychotic features, and alcohol, cannabis, and cocaine abuse. She was discharged on Lamictal 31m daily, Remeron 139mqhs, and Risperdal 28m93mhs. She was to have a residential intake phone interview as well as  [facility]   On assessment, patient reports that she did not follow through on the rehab referrals and relapsed on crack when she could not get her cousin to answer the phone after discharge. She reports she felt she was making repeated mistakes with her ongoing substance abuse which prompted her most recent suicide attempt. The patient reports ongoing depression with anhedonia, hopelessness, guilt, fatigue, insomnia, and sense of worthlessness. She admits she did not continue her psychotropic medications after her most recent hospital discharge. She denies current SI, intent or plan and denies HI. She denies AVH, paranoia, ideas of reference or first rank symptoms. She denies current cravings or withdrawal from cocaine and denies relapse with other substances other than crack prior to this admission. See H&P for additional details.   CLINICAL FACTORS:   Bipolar Disorder:   Depressive phase Alcohol/Substance Abuse/Dependencies More than one psychiatric diagnosis Previous Psychiatric Diagnoses and Treatments  Musculoskeletal: Strength & Muscle Tone: within normal limits Gait & Station: normal Patient leans: N/A Psychiatric Specialty Exam: Physical Exam Vitals and nursing note reviewed.  HENT:     Head: Normocephalic.  Pulmonary:     Effort: Pulmonary effort is normal.  Neurological:     General: No focal deficit present.     Mental Status: She is alert.    Review of Systems - see H&P  Blood pressure 116/84, pulse (!) 101, temperature 98.1 F (36.7 C), temperature source Oral, resp. rate 18, height 5' 8"  (1.727 m), weight 88.5 kg, SpO2 100 %.Body mass index is 29.67 kg/m.  General Appearance:  casually dressed, fair hygiene  Eye Contact:  Fair  Speech:  Clear and Coherent and Normal Rate  Volume:  Decreased  Mood:  Dysphoric  Affect:  Constricted  Thought Process:  Goal Directed and Linear  Orientation:  Full (Time, Place, and Person)  Thought  Content:  Logical and denies AVH, paranoia, or delusions  Suicidal Thoughts:   Suicide attempt leading to admission but denies current SI and contracts for safety on the unit  Homicidal Thoughts:   Denied  Memory:  Recent;   Fair  Judgement:  Impaired  Insight:  Lacking  Psychomotor Activity:  Decreased  Concentration:  Concentration: Fair and Attention Span: Fair  Recall:  [employer] of Knowledge:  Fair  Language:  Good  Akathisia:  Negative  Assets:  Communication Skills Desire for Improvement Resilience  ADL's:  independent  Cognition:  WNL  Sleep:  Number of Hours: 5.75    COGNITIVE FEATURES THAT CONTRIBUTE TO RISK:  Thought constriction (tunnel vision)    SUICIDE RISK:   Severe:  Frequent, intense, and enduring suicidal ideation, specific plan, no subjective intent, but some objective markers of intent (i.e., choice of lethal method), the method is accessible, some limited preparatory behavior, evidence of impaired self-control, severe dysphoria/symptomatology, multiple risk factors present, and few if any protective factors, particularly a lack of social support.  PLAN OF CARE: Patient admitted voluntarily to [facility]. Admission labs reviewed: respiratory panel negative, CMP WNL except for creatinine 1.02 (down from 1.35), Albumin 3.4, and ALT 48 (down from 84). Tylenol level <10 down from 201. PT 14.6/INR 1.1; Phos 2.9; HIV nonreactive, serum pregnancy negative, UDS positive for cocaine, Mag 2.2, Salicylate <7, WBC 98.9, H/H 12.7/39 and platelets 354; ETOH <10, EKG shows NSR 96bpm with QTC 447m. Will repeat CBC for trending of WBC and recheck CMP for trending of creatinine and LFTs. TSH checked during last admission was 5.075 with FT3 2.2 and FT4 0.70, A1c during last admission 5.7 and during last admission cholesterol elevated at 230 with LDL 146 and triglycerides 111 and HDL 62.   She has [facility]  231[facility], Lamictal 2567maily, Remeron 47m59ms. She was restarted on Zocor 40mg52mly, Protonix 40mg 6my, Vitamin B12 1000mcg 31my, and Norvasc 10mg da48m She will be restarted on Melatonin 5mg qhs 40m insomnia. She will be restarted on Vitamin D replacement based on low Vit D during last admission. CXR ordered and patient started on albuterol and Pulmicort nebs, albuterol MDI PRN and Flovent 1 puff bid. Will ask internal medicine recommendations for continued management of asthma. Will ask PT to assess for mobility needs - patient currently in wheelchair PRN. Patient was started on amantadine prior to admission to help with cocaine cravings but given her h/o recent psychosis during last admission, will discontinue to avoid dopamine agonist and risk of psychosis return. Will place on CIWA for monitoring in the event she has not been forthcoming about recent ETOH use.   I certify that inpatient services furnished can reasonably be expected to improve the patient's condition.   Alfredo Spong E SinHarlow AsaA 09/19/2021, 12:45 PM

## 2021-09-19 NOTE — Progress Notes (Signed)
Recreation Therapy Notes  Date: 11.9.22 Time: 0930-1000 Location: 300 Hall Dayroom  Group Topic: Stress Management   Goal Area(s) Addresses:  Patient will actively participate in stress management techniques presented during session.  Patient will successfully identify benefit of practicing stress management post d/c.   Intervention: Relaxation exercise with ambient sound and script   Activity: Guided Imagery. LRT provided education, instruction, and demonstration on practice of visualization via guided imagery. Patient was asked to participate in the technique introduced during session. LRT debriefed including topics of mindfulness, stress management and specific scenarios each patient could use these techniques. Patients were given suggestions of ways to access scripts post d/c and encouraged to explore Youtube and other apps available on smartphones, tablets, and computers.  Education:  Stress Management, Discharge Planning.   Clinical Observations/Feedback: Group did not occur due to orientation group going over 20 minutes.  Packets were given with information on ways to manage stress, techniques that can be used, proper breathing techniques and how to properly do the techniques given such as progressive muscle relaxation, diaphragmatic breathing and mindfulness meditation.     Victorino Sparrow, LRT, CTRS        Ria Comment, Barret Esquivel A 09/19/2021 12:04 PM

## 2021-09-19 NOTE — Progress Notes (Addendum)
   09/19/21 1200  Psych Admission Type (Psych Patients Only)  Admission Status Voluntary  Psychosocial Assessment  Patient Complaints Depression  Eye Contact Fair;Brief  Facial Expression Worried  Affect Appropriate to circumstance  Speech Soft  Interaction Assertive  Motor Activity Unsteady  Appearance/Hygiene Unremarkable  Behavior Characteristics Cooperative;Appropriate to situation  Mood Anxious;Sad  Aggressive Behavior  Effect No apparent injury  Thought Process  Coherency WDL  Content WDL  Delusions None reported or observed  Perception WDL  Hallucination None reported or observed  Judgment Poor  Confusion None  Danger to Self  Current suicidal ideation? Passive  Self-Injurious Behavior No self-injurious ideation or behavior indicators observed or expressed   Agreement Not to Harm Self Yes  Description of Agreement Verbal contract  Danger to Others  Danger to Others None reported or observed  D. Pt presents with a depressed affect/ mood - minimal during interactions. Pt appears like she isn't feeling well as she has had a non- productive cough with some associated rib pain . Pt observed ambulating on the unit after being cleared by PT, and went down to the dining room to eat dinner. Pt currently denies SI/HI and AVH  A. Labs and vitals monitored. Pt administered scheduled medications and breathing treatment to relieve respiratory symptoms. Pt supported emotionally and encouraged to express concerns and ask questions.   R. Pt remains safe with 15 minute checks. Will continue POC.

## 2021-09-19 NOTE — Progress Notes (Addendum)
Upon patient's arrival to [facility], I was notified by nursing staff that patient was coughing and complaining of chest pain and SOB. Of note, per chart review, patient does appear to have documented history of NSTEMI (2019), chest pain (2017), and asthma exacerbation (2018).   Patient seen and examined by myself in the [facility] search room during patient's admission with multiple RN present.  Patient reports non-productive cough over the past day. Patient endorses SOB and chest pain "when I cough". She reports that she only experiences chest pain and SOB when coughing and denies experiencing SOB or chest pain on any other occasions. She denies any headache, lightheadedness, dizziness, nausea, vomiting, abdominal pain, other pain, or any additional physical symptoms on exam at this time. Per chart review, Patient recently had EKG done yesterday on 09/17/21, which showed normal sinus rhythm, QT/QTC 370/467, and no acute/concerning findings.   On exam, patient is not diaphoretic or toxic/ill-appearing at this time. Patient intermittently experiences continuous bouts of coughing that appear to be non-productive in nature. Heart regular rate and rhythm with no murmurs, rubs, or gallops noted. Lungs are clear to auscultation in bilateral anterior and posterior lung fields. No wheezes, rhonchi, or crackles noted. Respirations appear to be regular and unlabored when patient is not coughing. No respiratory distress noted.  Based on patient's current presentation, patient's description of her chest pain, and recent 09/17/21 EKG findings, patient's described chest pain appears to be likely musculoskeletal in nature/attributed to her coughing and I do not suspect that patient is experiencing ischemia/MI/ACS/PE or any other emergent medical condition/process at this time. Additionally, based on patient's history, believe that patient's cough/SOB is likely related to asthma/asthma-like process at this time. Thus, no additional EKG or  medical work-up needed at this time.   Per chart review, patient received albuterol 2.5 mg/3 mL nebulizer treatment at 1745 on 09/18/21. Will give an additional albuterol 2.5 mg/3 mL nebulizer treatment for patient's cough/SOB (given at 2227 without issue). Patient currently has Albuterol nebulizer 2.5 mg treatment ordered for every 6 hours PRN for wheezing/shortness of breath, which was ordered for her during her [facility] hospital admission as well. Per chart review, patient's PTA med list includes albuterol 108 (90 base) MCG/ACT inhaler (1-2 puffs q6H PRN for wheezing/shortness of breath) as well as Pulmicort 0.25 mg/2 mL nebulizer treatment BID (it was documented by pharmacy on 09/17/21 that patient was not taking this medication at home, but that this prescription had been ready for patient to pick up at [facility] since 09/13/21). Will leave patient's current order for Albuterol nebulizer 2.5 mg treatment ordered for every 6 hours PRN for wheezing/shortness of breath as is at this time. Recommend that day shift treatment team discuss potential re-initiation of patient's Pulmicort and/or albuterol inhaler with patient during day time evaluation. Nursing staff to communicate this recommendation to day shift treatment team upon shift change.   Nursing staff to notify me if patient reports worsening of symptoms or if any additional symptoms/issues arise.  09/19/21 0356 Update: Per nursing staff, patient is sleeping with no physical complaints other than occasional cough at this time.

## 2021-09-19 NOTE — Progress Notes (Signed)
Adult Psychoeducational Group Note  Date:  09/19/2021 Time:  9:38 PM  Group Topic/Focus:  Wrap-Up Group:   The focus of this group is to help patients review their daily goal of treatment and discuss progress on daily workbooks.  Participation Level:  Active  Participation Quality:  Appropriate  Affect:  Appropriate  Cognitive:  Appropriate  Insight: Appropriate  Engagement in Group:  Engaged  Modes of Intervention:  Discussion  Additional Comments:  patient said her day was 2. Her goal to get off drugs. She do not know if she achieved goal. She do not have coping skills  Barbette Hair 09/19/2021, 9:38 PM

## 2021-09-19 NOTE — Tx Team (Signed)
Initial Treatment Plan 09/19/2021 2:51 AM Denise Knight NVB:[id number]    PATIENT STRESSORS: Financial difficulties   Marital or family conflict     PATIENT STRENGTHS: Motivation for treatment/growth    PATIENT IDENTIFIED PROBLEMS: Suicidal ideation   Depression   anxiety  (Patient did not identify any goals at this time)               DISCHARGE CRITERIA:  Improved stabilization in mood, thinking, and/or behavior Verbal commitment to aftercare and medication compliance  PRELIMINARY DISCHARGE PLAN: Outpatient therapy Participate in family therapy Return to previous living arrangement  PATIENT/FAMILY INVOLVEMENT: This treatment plan has been presented to and reviewed with the patient, Denise Knight, and/or family member.  The patient and family have been given the opportunity to ask questions and make suggestions.  Arvid Right, RN 09/19/2021, 2:51 AM

## 2021-09-19 NOTE — Group Note (Signed)
Group Topic: Other  Group Date: 09/19/2021 Start Time: 1600 End Time: [place] Facilitators: Kathreen Cornfield  Department: [facility] ADULT [address]  Number of Participants: 9  Group Focus: personal responsibility Treatment Modality:  Psychoeducation Interventions utilized were other  Purpose: Encourage patients to accept personal responsiblity  Name: Denise Knight Date of Birth: 1965/12/19  MR: [id number]    Level of Participation: moderate Quality of Participation: engaged Interactions with others: appropriate Mood/Affect: appropriate Triggers (if applicable):  Cognition:  Progress: Minimal Response: Accept responsibility for negative actions Plan: patient will be encouraged to work on goals. Patients Problems:  Patient Active Problem List   Diagnosis Date Noted   GERD (gastroesophageal reflux disease)    Hypertension    Polysubstance abuse ([place])    Bipolar II disorder, most recent episode major depressive ([place]) 09/04/2021   Vitamin D deficiency 09/03/2021   Cannabis dependence with current use ([place]) 09/02/2021   MDD (major depressive disorder) 01/12/2020   Severe recurrent major depression without psychotic features ([place]) 01/12/2020   Alcohol use disorder, moderate, dependence ([place]) 01/12/2020   Cocaine abuse ([place]) 12/27/2017   Rectal bleeding 12/27/2017   NSTEMI (non-ST elevated myocardial infarction) ([place]) 12/23/2017   Acute viral bronchitis 06/14/2017   Asthma exacerbation 06/14/2017   Chest pain 07/05/2016
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
## 2021-09-19 NOTE — BHH Counselor (Signed)
CSW called [facility] customer service number to discuss options for substance use rehab programs.  At this time the system was down and recording indicated to call back later.  CSW also provided number to patient to call for options.  CSW will try to call back tomorrow when the system is working again to learn additional rehab options that may be available.    CSW also called to follow up on a referral that social work placed during the patient last admission to [facility].  CSW had to leave a voicemail with admissions for a call back.      Reata Petrov, LCSW, [place] Social Worker  [facility]
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
## 2021-09-19 NOTE — H&P (Addendum)
Psychiatric Admission Assessment Adult  Patient Identification: Denise Knight MRN:  [id number] Date of Evaluation:  09/19/2021 Chief Complaint:  Depression following suicide attempt Principal Diagnosis: Bipolar II MRE depressed Diagnosis:  Active Problems:   Cocaine abuse   History of Present Illness: Denise Knight is a 55 year old female with reported PPHx of depression, bipolar, PTSD who was admitted to [facility] from [facility] ED due to suicide attempt with 32 pills of Tylenol PM.  CHART REVIEW: Patient has had multiple psychiatric admissions.  Patient last noted admission was September 02, 2021, and she was discharged last week. At the time of discharge, patient was much improved from admission feeling happy with an optimistic attitude and no reported suicidal ideation. She was denied from inpatient treatment programs but had planned to go to [place] to be with her cousin.  In the past, patient has had follow-up to [facility] residential treatment program and [facility] for outpatient medication management.  On 11/6, patient presented to the ED and reported taking a whole bottle of Tylenol PM. Her acetaminophen level was 201. UDS was positive for cocaine. Received appropriate treatment for overdose, AKI and was admitted to [facility].   TODAY'S INTERVIEW: Patient seen and assessed with resident Dr. Rosita Kea. Patient said she is not too good today. She says last Wednesday, after she was discharged, she got high. She said she tried to call her cousin in [place] through Facebook but could not get in contact with her. She has not made any contact with her daughter. She decided to go to her friend's (patient's daughter's friend's mother) house, and she asked for drugs. Drugs were brought to the house, and the patient did drugs with her friend. Patient says they stayed up for four days, did not sleep or eat, and were getting high on crack cocaine, smoking cigarettes, and drinking two 12-packs of beer.  She said on Sunday, she ordered an uber to go to another friend's house, but in the [place], she changed her mind and requested to be taken to the store. She went to the checkout counter, and the lady at the register noticed the bottle was empty and asked the patient if she had taken them and she said yes. She had taken 32 pills of Tylenol PM. The patient tried to exit to get to the uber, but the cashier flagged it down and told the driver to take the patient to the hospital. She has attempted suicide several times in the past and has attempted previously by overdose.  Patient said she did not take any medications after leaving the hospital. Patient has regularly self-discontinued her psychiatric medications in the past because she does not feel that they are helpful nor does she feel like she needs to be on medication. She says she did not get them filled this time because she started getting high and did not think about them at all. The patient says the decision to try to commit suicide was very impulsive. She says she only has thoughts about suicide when she is high, which is why she wants to quit drugs. She says she does not remember anything else from Sunday. She knows she has a tube down her throat on Sunday but came to again on Monday. She says she had an asthma attack on Monday and received breathing treatments and Robitussin.    Currently, patient feels angry at herself for getting high. She said she feels hopeless, helpless, worthless, and guilty. She has low energy, but says she  really wants to live. She ate yesterday but had no appetite this morning and could barely eat grits, oatmeal, and a banana. She said she is usually a "big eater", but her appetite has been up and down. She said she slept okay. She has a history of bipolar, but at this time, she denied elevated mood, grandiosity, high energy, talkativeness. She only endorsed sleeplessness. She has a history of PTSD but denied nightmares,  flashbacks, and avoidance behaviors over the past week. She did mention she woke up this morning and "stumbled down the hallway" at 20 minutes until 3am because she thought it was daytime.   She denies SI/HI/AVH, ideas of reference, ideas of influence. She is frustrated that she is now considered a "falls risk". She has not been walking since Sunday, and she says she feels dizzy and weak in her legs. She endorses shortness of breath, lightheadedness, a non-productive cough with associated chest and right-sided rib pain. She has been more to herself and has not wanted to interact with others. She wants to improve her mental state and get off drugs.  Patient is agreeable to restart taking medication at this time. At the previous admission, patient stated that she feels particularly depressed every year around this time of year because this is around the time her mother passed. She would like to participate in inpatient rehab and will attempt to talk to social work about this.   Associated Signs/Symptoms: Depression Symptoms:  depressed mood, anhedonia, insomnia, fatigue, feelings of worthlessness/guilt, difficulty concentrating, hopelessness, recurrent thoughts of death, suicidal attempt, anxiety, loss of energy/fatigue, disturbed sleep, Duration of Depression Symptoms: 3 days  (Hypo) Manic Symptoms:   NONE Anxiety Symptoms:   NONE Psychotic Symptoms:   NONE PTSD Symptoms: NONE Had a traumatic exposure:  MULTIPLE SEXUAL IN NATURE Total Time spent with patient: 1 hour I personally spent 60 minutes on the unit in direct patient care. The direct patient care time included face-to-face time with the patient, reviewing the patient's chart, communicating with other professionals, and coordinating care. Greater than 50% of this time was spent in counseling or coordinating care with the patient regarding goals of hospitalization, psycho-education, and discharge planning needs.   Past Psychiatric  History: Depression, bipolar, PTSD; Suicide attempt via overdose on crack and Advil PM with [facility] admission October 23-Sep 14, 2021;History of multiple suicide attempts by overdose, most of her lethality that resulted in medical admission to the hospital.  She has previously taken multiple psychotropic medications to include lithium, Seroquel, Prozac.  She was recently discharged on Lamictal 25 mg p.o. daily, mirtazapine 15 mg p.o. nightly, risperidone 2 mg p.o. nightly.  Is the patient at risk to self? Yes Has the patient been a risk to self in the past 6 months? Yes.    Has the patient been a risk to self within the distant past? Yes.    Is the patient a risk to others? No.  Has the patient been a risk to others in the past 6 months? No.  Has the patient been a risk to others within the distant past? No.   Prior Inpatient Therapy:  yes Prior Outpatient Therapy:  yes  Alcohol Screening: 1. How often do you have a drink containing alcohol?: Monthly or less 2. How many drinks containing alcohol do you have on a typical day when you are drinking?: 7, 8, or 9 3. How often do you have six or more drinks on one occasion?: Weekly AUDIT-C Score: 7 4. How often  during the last year have you found that you were not able to stop drinking once you had started?: Less than monthly 5. How often during the last year have you failed to do what was normally expected from you because of drinking?: Less than monthly 6. How often during the last year have you needed a first drink in the morning to get yourself going after a heavy drinking session?: Less than monthly 7. How often during the last year have you had a feeling of guilt of remorse after drinking?: Less than monthly 8. How often during the last year have you been unable to remember what happened the night before because you had been drinking?: Less than monthly 9. Have you or someone else been injured as a result of your drinking?: Yes, but not in the last  year 10. Has a relative or friend or a doctor or another health worker been concerned about your drinking or suggested you cut down?: Yes, but not in the last year Alcohol Use Disorder Identification Test Final Score (AUDIT): 16 Alcohol Brief Interventions/Follow-up: Patient Refused Substance Abuse History in the last 12 months:  Yes.   Consequences of Substance Abuse: Medical Consequences:  ED visits, patient reports recurrent suicidal thoughts "only when she is high" Previous Psychotropic Medications: Yes  Psychological Evaluations: Yes  Past Medical History:  Past Medical History:  Diagnosis Date   Allergy    seasonal   Anxiety    Arthritis    "left knee" (07/05/2016)   Asthma    Bipolar 1 disorder ([place])    Depression    Diabetes mellitus without complication (HCC)    pre-   GERD (gastroesophageal reflux disease)    Hypertension    MI (mitral incompetence)    Post traumatic stress disorder    Schizophrenia ([place])    Stomach ulcer    Substance abuse ([place])    cocaine quit july 2020   Suicide attempt [facility])     Past Surgical History:  Procedure Laterality Date   CARDIAC CATHETERIZATION  07/05/2016   CARDIAC CATHETERIZATION N/A 07/05/2016   Procedure: Left Heart Cath and Coronary Angiography;  Surgeon: Adrian Prows, MD;  Location: [facility];  Service: Cardiovascular;  Laterality: N/A;   CYST EXCISION Right    "wrist"   DILATION AND CURETTAGE OF UTERUS     FOOT SURGERY Bilateral    "corns removed"   LEFT HEART CATH AND CORONARY ANGIOGRAPHY N/A 12/24/2017   Procedure: LEFT HEART CATH AND CORONARY ANGIOGRAPHY;  Surgeon: Nigel Mormon, MD;  Location: [facility];  Service: Cardiovascular;  Laterality: N/A;   TUBAL LIGATION     Family History:  Family History  Problem Relation Age of Onset   Breast cancer Other    Lung cancer Other    Congestive Heart Failure Other    Diabetes Other    Hypertension Other    Colon cancer Neg Hx    Colon polyps Neg Hx     Esophageal cancer Neg Hx    Stomach cancer Neg Hx    Rectal cancer Neg Hx    Family Psychiatric  History: Cousin completed suicide at 68. Tobacco Screening: Patient said she was smoking cigarettes with her friend last week but denied other recent use.  Social History:  Social History   Substance and Sexual Activity  Alcohol Use Yes   Alcohol/week: 7.0 standard drinks   Types: 7 Cans of beer per week     Social History  Substance and Sexual Activity  Drug Use Yes   Types: Marijuana    Additional Social History:     During prior admission, patient said she was living with her daughter, and 2 grandchildren, in a modular home.  Patient receives income via widow's pension.      Allergies:   Allergies  Allergen Reactions   Aspirin Hives   Food Hives    "Regular butter"   Morphine And Related Hives   Lab Results:  Results for orders placed or performed during the hospital encounter of 09/16/21 (from the past 48 hour(s))  Glucose, capillary     Status: Abnormal   Collection Time: 09/17/21  5:53 PM  Result Value Ref Range   Glucose-Capillary 102 (H) 70 - 99 mg/dL    Comment: Glucose reference range applies only to samples taken after fasting for at least 8 hours.   Comment 1 Notify RN    Comment 2 Document in Chart   Glucose, capillary     Status: Abnormal   Collection Time: 09/17/21 11:37 PM  Result Value Ref Range   Glucose-Capillary 103 (H) 70 - 99 mg/dL    Comment: Glucose reference range applies only to samples taken after fasting for at least 8 hours.  Comprehensive metabolic panel     Status: Abnormal   Collection Time: 09/18/21  2:45 AM  Result Value Ref Range   Sodium 138 135 - 145 mmol/L   Potassium 3.6 3.5 - 5.1 mmol/L   Chloride 110 98 - 111 mmol/L   CO2 22 22 - 32 mmol/L   Glucose, Bld 86 70 - 99 mg/dL    Comment: Glucose reference range applies only to samples taken after fasting for at least 8 hours.   BUN 12 6 - 20 mg/dL   Creatinine, Ser 1.02 (H) 0.44  - 1.00 mg/dL   Calcium 9.0 8.9 - 10.3 mg/dL   Total Protein 6.6 6.5 - 8.1 g/dL   Albumin 3.4 (L) 3.5 - 5.0 g/dL   AST 19 15 - 41 U/L   ALT 48 (H) 0 - 44 U/L   Alkaline Phosphatase 61 38 - 126 U/L   Total Bilirubin 0.6 0.3 - 1.2 mg/dL   GFR, Estimated >60 >60 mL/min    Comment: (NOTE) Calculated using the CKD-EPI Creatinine Equation (2021)    Anion gap 6 5 - 15    Comment: Performed at [facility], [place] [address] [place], [place] [address]  Glucose, capillary     Status: None   Collection Time: 09/18/21  6:02 AM  Result Value Ref Range   Glucose-Capillary 72 70 - 99 mg/dL    Comment: Glucose reference range applies only to samples taken after fasting for at least 8 hours.  Glucose, capillary     Status: None   Collection Time: 09/18/21 11:14 AM  Result Value Ref Range   Glucose-Capillary 93 70 - 99 mg/dL    Comment: Glucose reference range applies only to samples taken after fasting for at least 8 hours.  Resp Panel by RT-PCR (Flu A&B, Covid) Nasopharyngeal Swab     Status: None   Collection Time: 09/18/21 12:54 PM   Specimen: Nasopharyngeal Swab; Nasopharyngeal(NP) swabs in vial transport medium  Result Value Ref Range   SARS Coronavirus 2 by RT PCR NEGATIVE NEGATIVE    Comment: (NOTE) SARS-CoV-2 target nucleic acids are NOT DETECTED.  The SARS-CoV-2 RNA is generally detectable in upper respiratory specimens during the acute phase of infection. The lowest concentration  of SARS-CoV-2 viral copies this assay can detect is 138 copies/mL. A negative result does not preclude SARS-Cov-2 infection and should not be used as the sole basis for treatment or other patient management decisions. A negative result may occur with  improper specimen collection/handling, submission of specimen other than nasopharyngeal swab, presence of viral mutation(s) within the areas targeted by this assay, and inadequate number of viral copies(<138 copies/mL). A negative result must  be combined with clinical observations, patient history, and epidemiological information. The expected result is Negative.  Fact Sheet for Patients:  EntrepreneurPulse.com.au  Fact Sheet for Healthcare Providers:  IncredibleEmployment.be  This test is no t yet approved or cleared by the [place] FDA and  has been authorized for detection and/or diagnosis of SARS-CoV-2 by FDA under an Emergency Use Authorization (EUA). This EUA will remain  in effect (meaning this test can be used) for the duration of the COVID-19 declaration under Section 564(b)(1) of the Act, 21 U.S.C.section 360bbb-3(b)(1), unless the authorization is terminated  or revoked sooner.       Influenza A by PCR NEGATIVE NEGATIVE   Influenza B by PCR NEGATIVE NEGATIVE    Comment: (NOTE) The Xpert Xpress SARS-CoV-2/FLU/RSV plus assay is intended as an aid in the diagnosis of influenza from Nasopharyngeal swab specimens and should not be used as a sole basis for treatment. Nasal washings and aspirates are unacceptable for Xpert Xpress SARS-CoV-2/FLU/RSV testing.  Fact Sheet for Patients: EntrepreneurPulse.com.au  Fact Sheet for Healthcare Providers: IncredibleEmployment.be  This test is not yet approved or cleared by the [place] FDA and has been authorized for detection and/or diagnosis of SARS-CoV-2 by FDA under an Emergency Use Authorization (EUA). This EUA will remain in effect (meaning this test can be used) for the duration of the COVID-19 declaration under Section 564(b)(1) of the Act, 21 U.S.C. section 360bbb-3(b)(1), unless the authorization is terminated or revoked.  Performed at [facility], [place] [address], [place], [place] [address]     Blood Alcohol level:  Lab Results  Component Value Date   [facility] <10 09/16/2021   ETH <10 87/56/4332    Metabolic Disorder Labs:  Lab Results  Component Value  Date   HGBA1C 5.7 (H) 09/03/2021   MPG 116.89 09/03/2021   MPG 116.89 01/13/2020   No results found for: PROLACTIN Lab Results  Component Value Date   CHOL 230 (H) 09/03/2021   TRIG 111 09/03/2021   HDL 62 09/03/2021   CHOLHDL 3.7 09/03/2021   VLDL 22 09/03/2021   LDLCALC 146 (H) 09/03/2021   LDLCALC 114 (H) 01/13/2020    Current Medications: Current Facility-Administered Medications  Medication Dose Route Frequency Provider Last Rate Last Admin   albuterol (PROVENTIL) (2.5 MG/3ML) 0.083% nebulizer solution 2.5 mg  2.5 mg Inhalation Q6H PRN Suella Broad, FNP   2.5 mg at 09/19/21 0934   albuterol (VENTOLIN HFA) 108 (90 Base) MCG/ACT inhaler 1-2 puff  1-2 puff Inhalation Q6H PRN Nelda Marseille, Ezinne Yogi E, MD       alum & mag hydroxide-simeth (MAALOX/MYLANTA) 200-200-20 MG/5ML suspension 30 mL  30 mL Oral Q4H PRN Starkes-Perry, Gayland Curry, FNP       amLODipine (NORVASC) tablet 10 mg  10 mg Oral Daily Starkes-Perry, Takia S, FNP   10 mg at 09/19/21 0900   budesonide (PULMICORT) nebulizer solution 0.25 mg  0.25 mg Nebulization BID PRN Nelda Marseille, Dandria Griego E, MD       fluticasone (FLOVENT HFA) 110 MCG/ACT inhaler 1 puff  1 puff  Inhalation BID Harlow Asa, MD   1 puff at 78/46/96 2952   folic acid (FOLVITE) tablet 1 mg  1 mg Oral Daily Doda, Vandana, MD       guaiFENesin (ROBITUSSIN) 100 MG/5ML liquid 5 mL  5 mL Oral Q4H PRN Starkes-Perry, Gayland Curry, FNP       hydrOXYzine (ATARAX/VISTARIL) tablet 25 mg  25 mg Oral Q6H PRN Harlow Asa, MD       lamoTRIgine (LAMICTAL) tablet 25 mg  25 mg Oral Daily Suella Broad, FNP   25 mg at 09/19/21 0900   loperamide (IMODIUM) capsule 2-4 mg  2-4 mg Oral PRN Harlow Asa, MD       loratadine (CLARITIN) tablet 10 mg  10 mg Oral Daily Kyle, Tyrone A, DO       LORazepam (ATIVAN) tablet 1 mg  1 mg Oral Q6H PRN Nelda Marseille, Hiilani Jetter E, MD       magnesium hydroxide (MILK OF MAGNESIA) suspension 30 mL  30 mL Oral Daily PRN Starkes-Perry, Gayland Curry, FNP        melatonin tablet 5 mg  5 mg Oral QHS Nelda Marseille, Lonny Eisen E, MD       mirtazapine (REMERON) tablet 15 mg  15 mg Oral QHS Suella Broad, FNP   15 mg at 09/18/21 2222   multivitamin with minerals tablet 1 tablet  1 tablet Oral Daily Nelda Marseille, Tesla Bochicchio E, MD       nicotine polacrilex (NICORETTE) gum 2 mg  2 mg Oral PRN Harlow Asa, MD       ondansetron (ZOFRAN-ODT) disintegrating tablet 4 mg  4 mg Oral Q6H PRN Nelda Marseille, Romesha Scherer E, MD       pantoprazole (PROTONIX) EC tablet 40 mg  40 mg Oral q morning Suella Broad, FNP   40 mg at 09/19/21 0900   predniSONE (DELTASONE) tablet 40 mg  40 mg Oral Daily Kyle, Tyrone A, DO       risperiDONE (RISPERDAL) tablet 2 mg  2 mg Oral QHS Suella Broad, FNP   2 mg at 09/18/21 2222   simvastatin (ZOCOR) tablet 40 mg  40 mg Oral Q supper Starkes-Perry, Takia S, FNP       sodium chloride (OCEAN) 0.65 % nasal spray 1 spray  1 spray Each Nare PRN Starkes-Perry, Gayland Curry, FNP       [START ON 09/20/2021] thiamine tablet 100 mg  100 mg Oral Daily Nelda Marseille, Amr Sturtevant E, MD       vitamin B-12 (CYANOCOBALAMIN) tablet 1,000 mcg  1,000 mcg Oral Daily Suella Broad, FNP   1,000 mcg at 09/19/21 0900   Vitamin D (Ergocalciferol) (DRISDOL) capsule 50,000 Units  50,000 Units Oral Q7 days Harlow Asa, MD       PTA Medications: Medications Prior to Admission  Medication Sig Dispense Refill Last Dose   albuterol (VENTOLIN HFA) 108 (90 Base) MCG/ACT inhaler Inhale 1-2 puffs into the lungs every 6 (six) hours as needed for wheezing or shortness of breath. 1 each 0    amLODipine (NORVASC) 10 MG tablet Take 10 mg by mouth every morning.      budesonide (PULMICORT) 0.25 MG/2ML nebulizer solution Take 2 mLs (0.25 mg total) by nebulization 2 (two) times daily. (Patient not taking: No sig reported) 120 mL 0     Musculoskeletal: Strength & Muscle Tone:  Patient said she feels really weak in her legs but has maintained her upper body strength. Gait & Station:  Patient said  she has not walked since Sunday. In a wheelchair during interview today. Said she "stumbled" down the hall to the nurse's station earlier this morning. Patient leans: N/A   Psychiatric Specialty Exam:  Presentation  General Appearance: Casual, wearing house robe, tired and ill-appearing  Eye Contact:Good  Speech:Clear and Coherent; Produces words slowly and speaking requires a lot of effort  Speech Volume:Decreased  Handedness:Right  Mood and Affect  Mood:Patient says, "I feel a little hopeless, helpless, worthless. I am angry at myself for getting high."  Affect:Depressed; Flat   Thought Process  Thought Processes:Linear; Coherent  Duration of Psychotic Symptoms: N/A  Past Diagnosis of Schizophrenia or Psychoactive disorder: No  Descriptions of Associations:Intact  Orientation:Full (Time, Place and Person)  Thought Content:Logical - denies AVH, paranoia, delusions, ideas of reference  Hallucinations:Denied  Ideas of Reference:Denied  Suicidal Thoughts:Denied  Homicidal Thoughts:Denied   Sensorium  Memory:Immediate Fair; Recent Fair; Remote Fair  Judgment: Poor  Insight:Lacking   Executive Functions  Concentration:Fair  Attention Span:Fair  [place], could name the current president but no others  Language:Good   Psychomotor Activity  Psychomotor Activity:Decreased  Assets  Assets:Communication Skills; Desire for Improvement   Sleep  Sleep:5.75 hours    Physical Exam Vitals and nursing note reviewed.  Constitutional:      Appearance: Normal appearance. She is normal weight.  HENT:     Head: Normocephalic and atraumatic.  Pulmonary:     Effort: Pulmonary effort is normal.  Neurological:     General: No focal deficit present.     Mental Status: She is oriented to person, place, and time.   Review of Systems  Constitutional:  Positive for malaise/fatigue.  Respiratory:  Positive for cough  and shortness of breath.   Cardiovascular:  Positive for chest pain.       Patient reports musculoskeletal in nature as it hurts when she presses on the area.  Gastrointestinal:  Negative for constipation and nausea.  Genitourinary:  Negative for hematuria.  Musculoskeletal:  Positive for myalgias.  Neurological:  Positive for dizziness.       Leg weakness  All other systems reviewed and are negative. Blood pressure 95/72, pulse 97, temperature 98.1 F (36.7 C), temperature source Oral, resp. rate 18, height 5' 8"  (1.727 m), weight 88.5 kg, SpO2 100 %. Body mass index is 29.67 kg/m.  Treatment Plan Summary: Daily contact with patient to assess and evaluate symptoms and progress in treatment and Medication management  ASSESSMENT Active Problems:   Bipolar II MRE depressed severe without current psychotic features   PTSD by hx   Stimulant use d/o - cocaine type   Alcohol use d/o by hx   Cannabis use d/o in early remission   Vitamin D deficiency   Hyperlipidemia   Asthma   HTN   GERD    Denise Knight is a 55 year-old female with reported PPHx of depression, bipolar, PTSD who was admitted [facility] [place] [facility] due to suicide attempt with 32 pills of Tylenol PM. Patient will be restarted on medications from prior admission. She desires improvement in her mental state and desires to quit drugs.  Labs reviewed: CMP-sodium 138, potassium 3.6, creatinine 1.02, AST 19, ALT 48 Glucose 86 Influenza A, B, COVID-negative EKG -normal sinus rhythm QTC 467 Acetaminophen level on 11/6-201 trended down to less than 10 Pregnancy test negative Salicylate level less than 7 Ethanol level less than 10 UDS positive for cocaine HIV negative Last lipid panel  on 10/24-cholesterol 230, LDL 146, triglyceride 111, HDL 62 HbA1c on 10/24-5.7 TSH on 10/24-5.075 and T3- 2.2, T4- 0.7  PLAN Psychiatric Problems Bipolar II MRE depressed severe without current psychotic features PTSD by  hx R/o substance induced mood d/o - ECG yesterday was normal QTC 419m -Continue Remeron 15 mg daily at bedtime. - Restarted Lamictal 224mdaily - Restarted Risperdal 69m38mhs -Restarted melatonin 5 mg daily at bedtime. - Stopped amantadine which had been added for help with cocaine cravings to avoid return of psychosis as noted during previous inpatient admission  Stimulant use d/o - cocaine type  Alcohol use d/o by hx  Cannabis use d/o in early remission -CIWA with Ativan as needed for CIWA greater than 10 -Continue thiamine 100 mg daily. -Continue multivitamin with minerals daily. -Start folic acid 1 mg daily. -Continue Zofran 4 mg every 6 hours as needed for nausea or vomiting. -Continue Imodium 2 to 4 mg as needed for diarrhea or loose stools for 72 hours.   Medical Problems  Asthma Cough/SOB Medicine was consulted and Dr. KylMarylyn Ishiharacommendations as follows - Robitussin 100m13mL syrup q4 PRN -Continue albuterol, Pulmicort, Flovent  -Start Short steroid burst with prednisone 40 mg daily for 5 days -Start loratadine -Chest x-ray ordered  HTN - Last blood pressure: 116/84 - Restarted Norvasc 10mg43mperlipidemia - Restarted simvastatin 40mg 58mRD - Restarted Protonix 40mg  36mmin Deficiencies - Continue Vitamin B12 and Vitamin D   Weakness - Patient is currently a falls risk. Will CTM. -Order physical therapy evaluation  Nicotine dependence Nicotine gum as needed for smoking cessation  MRSA positive - Start bactroban nasal bid  3. Safety and Monitoring: Involuntary admission to inpatient psychiatric unit for safety, stabilization and treatment Daily contact with patient to assess and evaluate symptoms and progress in treatment Patient's case to be discussed in multi-disciplinary team meeting Observation Level : q15 minute checks Vital signs: q12 hours Precautions: suicide, elopement   4. Discharge Planning: Social work and case management to assist with  discharge planning and identification of hospital follow-up needs prior to discharge Estimated LOS: 5-7 days Discharge Concerns: Need to establish a safety plan; Medication compliance and effectiveness Discharge Goals: Return home with outpatient referrals for mental health follow-up including medication management/psychotherapy  Observation Level/Precautions:  15 minute checks  Laboratory:  CBC Chemistry Profile HbAIC UA  Psychotherapy:    Medications:    Consultations:    Discharge Concerns:    Estimated LOS:  Other:     Physician Treatment Plan for Primary Diagnosis: Recurrent episode of depression, severe with suicide attempt Long Term Goal(s): Improvement in symptoms so as ready for discharge  Short Term Goals: Ability to identify changes in lifestyle to reduce recurrence of condition will improve, Ability to verbalize feelings will improve, Ability to disclose and discuss suicidal ideas, Ability to demonstrate self-control will improve, Ability to identify and develop effective coping behaviors will improve, Ability to maintain clinical measurements within normal limits will improve, Compliance with prescribed medications will improve, and Ability to identify triggers associated with substance abuse/mental health issues will improve  Physician Treatment Plan for Secondary Diagnosis: Principal Problem:   Bipolar II disorder, most recent episode major depressive (HCC) [place] Problems:   Cocaine abuse (HCC)  [place] Term Goal(s): Improvement in symptoms so as ready for discharge  Short Term Goals: Ability to identify changes in lifestyle to reduce recurrence of condition will improve, Ability to verbalize feelings will improve, Ability to disclose and discuss suicidal ideas, Ability  to demonstrate self-control will improve, Ability to identify and develop effective coping behaviors will improve, Ability to maintain clinical measurements within normal limits will improve, Compliance with  prescribed medications will improve, and Ability to identify triggers associated with substance abuse/mental health issues will improve  I certify that inpatient services furnished can reasonably be expected to improve the patient's condition.    Shea Evans, MS-3 Armando Reichert, MD, PGY-2   Attestation for Student and Resident Documentation:  I personally re-performed the history, physical exam and medical decision-making activities of this service and have verified that the service and findings are accurately documented in the  note.I edited the above note for accuracy and completeness.   Harlow Asa, MD 09/19/2021, 5:48 PM

## 2021-09-19 NOTE — BH IP Treatment Plan (Signed)
Interdisciplinary Treatment and Diagnostic Plan Update  09/19/2021 Time of Session: 9:50am  Denise Knight MRN: [id number]  Principal Diagnosis: Bipolar II disorder, most recent episode major depressive ([place])  Secondary Diagnoses: Principal Problem:   Bipolar II disorder, most recent episode major depressive ([place]) Active Problems:   Cocaine abuse ([place])   Current Medications:  Current Facility-Administered Medications  Medication Dose Route Frequency Provider Last Rate Last Admin   albuterol (PROVENTIL) (2.5 MG/3ML) 0.083% nebulizer solution 2.5 mg  2.5 mg Inhalation Q6H PRN Suella Broad, FNP   2.5 mg at 09/19/21 0934   albuterol (VENTOLIN HFA) 108 (90 Base) MCG/ACT inhaler 1-2 puff  1-2 puff Inhalation Q6H PRN Nelda Marseille, Amy E, MD       alum & mag hydroxide-simeth (MAALOX/MYLANTA) 200-200-20 MG/5ML suspension 30 mL  30 mL Oral Q4H PRN Starkes-Perry, Gayland Curry, FNP       amLODipine (NORVASC) tablet 10 mg  10 mg Oral Daily Starkes-Perry, Takia S, FNP   10 mg at 09/19/21 0900   budesonide (PULMICORT) nebulizer solution 0.25 mg  0.25 mg Nebulization BID PRN Harlow Asa, MD       fluticasone (FLOVENT HFA) 110 MCG/ACT inhaler 1 puff  1 puff Inhalation BID Nelda Marseille, Amy E, MD   1 puff at 09/19/21 0933   guaiFENesin (ROBITUSSIN) 100 MG/5ML liquid 5 mL  5 mL Oral Q4H PRN Suella Broad, FNP       hydrOXYzine (ATARAX/VISTARIL) tablet 25 mg  25 mg Oral Q6H PRN Nelda Marseille, Amy E, MD       lamoTRIgine (LAMICTAL) tablet 25 mg  25 mg Oral Daily Starkes-Perry, Takia S, FNP   25 mg at 09/19/21 0900   loperamide (IMODIUM) capsule 2-4 mg  2-4 mg Oral PRN Harlow Asa, MD       LORazepam (ATIVAN) tablet 1 mg  1 mg Oral Q6H PRN Nelda Marseille, Amy E, MD       magnesium hydroxide (MILK OF MAGNESIA) suspension 30 mL  30 mL Oral Daily PRN Starkes-Perry, Gayland Curry, FNP       melatonin tablet 5 mg  5 mg Oral QHS Singleton, Amy E, MD       mirtazapine (REMERON) tablet 15 mg  15 mg Oral QHS  Suella Broad, FNP   15 mg at 09/18/21 2222   multivitamin with minerals tablet 1 tablet  1 tablet Oral Daily Nelda Marseille, Amy E, MD       nicotine polacrilex (NICORETTE) gum 2 mg  2 mg Oral PRN Nelda Marseille, Amy E, MD       ondansetron (ZOFRAN-ODT) disintegrating tablet 4 mg  4 mg Oral Q6H PRN Nelda Marseille, Amy E, MD       pantoprazole (PROTONIX) EC tablet 40 mg  40 mg Oral q morning Suella Broad, FNP   40 mg at 09/19/21 0900   risperiDONE (RISPERDAL) tablet 2 mg  2 mg Oral QHS Suella Broad, FNP   2 mg at 09/18/21 2222   simvastatin (ZOCOR) tablet 40 mg  40 mg Oral Q supper Starkes-Perry, Takia S, FNP       sodium chloride (OCEAN) 0.65 % nasal spray 1 spray  1 spray Each Nare PRN Starkes-Perry, Gayland Curry, FNP       [START ON 09/20/2021] thiamine tablet 100 mg  100 mg Oral Daily Nelda Marseille, Amy E, MD       vitamin B-12 (CYANOCOBALAMIN) tablet 1,000 mcg  1,000 mcg Oral Daily Suella Broad, FNP   1,000 mcg at 09/19/21  0900   Vitamin D (Ergocalciferol) (DRISDOL) capsule 50,000 Units  50,000 Units Oral Q7 days Harlow Asa, MD       PTA Medications: Medications Prior to Admission  Medication Sig Dispense Refill Last Dose   albuterol (VENTOLIN HFA) 108 (90 Base) MCG/ACT inhaler Inhale 1-2 puffs into the lungs every 6 (six) hours as needed for wheezing or shortness of breath. 1 each 0    amLODipine (NORVASC) 10 MG tablet Take 10 mg by mouth every morning.      budesonide (PULMICORT) 0.25 MG/2ML nebulizer solution Take 2 mLs (0.25 mg total) by nebulization 2 (two) times daily. (Patient not taking: No sig reported) 120 mL 0     Patient Stressors: Financial difficulties   Marital or family conflict    Patient Strengths: Motivation for treatment/growth   Treatment Modalities: Medication Management, Group therapy, Case management,  1 to 1 session with clinician, Psychoeducation, Recreational therapy.   Physician Treatment Plan for Primary Diagnosis: Bipolar II  disorder, most recent episode major depressive ([place]) Long Term Goal(s): Improvement in symptoms so as ready for discharge   Short Term Goals: Ability to identify changes in lifestyle to reduce recurrence of condition will improve Ability to verbalize feelings will improve Ability to disclose and discuss suicidal ideas Ability to demonstrate self-control will improve Ability to identify and develop effective coping behaviors will improve Ability to maintain clinical measurements within normal limits will improve Compliance with prescribed medications will improve Ability to identify triggers associated with substance abuse/mental health issues will improve  Medication Management: Evaluate patient's response, side effects, and tolerance of medication regimen.  Therapeutic Interventions: 1 to 1 sessions, Unit Group sessions and Medication administration.  Evaluation of Outcomes: Not Met  Physician Treatment Plan for Secondary Diagnosis: Principal Problem:   Bipolar II disorder, most recent episode major depressive ([place]) Active Problems:   Cocaine abuse ([place])  Long Term Goal(s): Improvement in symptoms so as ready for discharge   Short Term Goals: Ability to identify changes in lifestyle to reduce recurrence of condition will improve Ability to verbalize feelings will improve Ability to disclose and discuss suicidal ideas Ability to demonstrate self-control will improve Ability to identify and develop effective coping behaviors will improve Ability to maintain clinical measurements within normal limits will improve Compliance with prescribed medications will improve Ability to identify triggers associated with substance abuse/mental health issues will improve     Medication Management: Evaluate patient's response, side effects, and tolerance of medication regimen.  Therapeutic Interventions: 1 to 1 sessions, Unit Group sessions and Medication administration.  Evaluation of Outcomes:  Not Met   RN Treatment Plan for Primary Diagnosis: Bipolar II disorder, most recent episode major depressive ([place]) Long Term Goal(s): Knowledge of disease and therapeutic regimen to maintain health will improve  Short Term Goals: Ability to remain free from injury will improve, Ability to participate in decision making will improve, Ability to verbalize feelings will improve, Ability to disclose and discuss suicidal ideas, and Ability to identify and develop effective coping behaviors will improve  Medication Management: RN will administer medications as ordered by provider, will assess and evaluate patient's response and provide education to patient for prescribed medication. RN will report any adverse and/or side effects to prescribing provider.  Therapeutic Interventions: 1 on 1 counseling sessions, Psychoeducation, Medication administration, Evaluate responses to treatment, Monitor vital signs and CBGs as ordered, Perform/monitor CIWA, COWS, AIMS and Fall Risk screenings as ordered, Perform wound care treatments as ordered.  Evaluation of Outcomes: Not Met  LCSW Treatment Plan for Primary Diagnosis: Bipolar II disorder, most recent episode major depressive ([place]) Long Term Goal(s): Safe transition to appropriate next level of care at discharge, Engage patient in therapeutic group addressing interpersonal concerns.  Short Term Goals: Engage patient in aftercare planning with referrals and resources, Increase social support, Increase emotional regulation, Facilitate acceptance of mental health diagnosis and concerns, Identify triggers associated with mental health/substance abuse issues, and Increase skills for wellness and recovery  Therapeutic Interventions: Assess for all discharge needs, 1 to 1 time with Social worker, Explore available resources and support systems, Assess for adequacy in community support network, Educate family and significant other(s) on suicide prevention, Complete  Psychosocial Assessment, Interpersonal group therapy.  Evaluation of Outcomes: Not Met   Progress in Treatment: Attending groups: No. Participating in groups: No. Taking medication as prescribed: Yes. Toleration medication: Yes. Family/Significant other contact made: Yes, individual(s) contacted:  Daughter  Patient understands diagnosis: Yes. Discussing patient identified problems/goals with staff: Yes. Medical problems stabilized or resolved: Yes. Denies suicidal/homicidal ideation: Yes. Issues/concerns per patient self-inventory: No.   New problem(s) identified: No, Describe:  None   New Short Term/Long Term Goal(s): medication stabilization, elimination of SI thoughts, development of comprehensive mental wellness plan.   Patient Goals: "To get better and to get off drugs"   Discharge Plan or Barriers: Patient recently admitted. CSW will continue to follow and assess for appropriate referrals and possible discharge planning.   Reason for Continuation of Hospitalization: Depression Medication stabilization Suicidal ideation Withdrawal symptoms  Estimated Length of Stay: 3 to 5 days    Scribe for Treatment Team: Carney Harder 09/19/2021 2:31 PM

## 2021-09-19 NOTE — Evaluation (Signed)
Physical Therapy Evaluation Patient Details Name: Denise Knight MRN: [id number] DOB: 08/02/1966 Today's Date: 09/19/2021  History of Present Illness   Patient is 55 year old female with reported PPHx of depression, bipolar, PTSD who was admitted to [facility] from [place] ED due to suicide attempt with 32 pills of Tylenol PM. Patient has had multiple psychiatric admissions.  Patient last noted admission was September 02, 2021, and she was discharged last week. At the time of discharge, patient was much improved from admission feeling happy with an optimistic attitude and no reported suicidal ideation. On 11/6, patient presented to the ED and reported taking a whole bottle of Tylenol PM. Her acetaminophen level was 201. UDS was positive for cocaine. Received appropriate treatment for overdose, AKI and was admitted to [facility].    Clinical Impression  Denise Knight is 55 y.o. female admitted with above HPI and diagnosis. Patient is currently limited by functional impairments below (see PT problem list). Patient lives with family and is independent at baseline for mobility. Patient evaluated by Physical Therapy with no further acute PT needs identified. All education has been completed and the patient has no further questions. She is mobilizing with no device and supervision to independent level. Recommend OPPT follow up for Rt knee pain and generalized LE weakness. See below for any follow-up Physical Therapy or equipment needs. PT is signing off. Thank you for this referral.        Recommendations for follow up therapy are one component of a multi-disciplinary discharge planning process, led by the attending physician.  Recommendations may be updated based on patient status, additional functional criteria and insurance authorization.  Follow Up Recommendations Outpatient PT    Assistance Recommended at Discharge PRN  Functional Status Assessment Patient has had a recent decline in their  functional status and demonstrates the ability to make significant improvements in function in a reasonable and predictable amount of time.  Equipment Recommendations  None recommended by PT    Recommendations for Other Services       Precautions / Restrictions Precautions Precautions: None Precaution Comments: denies falls in last 6-12 months Restrictions Weight Bearing Restrictions: No      Mobility  Bed Mobility Overal bed mobility: Independent                  Transfers Overall transfer level: Independent Equipment used: None                    Ambulation/Gait Ambulation/Gait assistance: Supervision Gait Distance (Feet): 300 Feet Assistive device: None Gait Pattern/deviations: Step-through pattern;Decreased stride length;Trendelenburg Gait velocity: fair     General Gait Details: obvious hip weakness with trendelenburg  Stairs            Wheelchair Mobility    Modified Rankin (Stroke Patients Only)       Balance Overall balance assessment: Mild deficits observed, not formally tested Sitting-balance support: Feet supported Sitting balance-Leahy Scale: Normal     Standing balance support: Reliant on assistive device for balance;Bilateral upper extremity supported Standing balance-Leahy Scale: Good                               Pertinent Vitals/Pain Pain Assessment: No/denies pain    Home Living Family/patient expects to be discharged to:: Private residence Living Arrangements: Children Available Help at Discharge: Family               Additional Comments: lives  with daughter and grandchildren    Prior Function Prior Level of Function : Independent/Modified Independent                     Hand Dominance        Extremity/Trunk Assessment   Upper Extremity Assessment Upper Extremity Assessment: Overall WFL for tasks assessed     Five x Sit<>Stand: 18.9 seconds without UE use for power up     Cervical / Trunk Assessment Cervical / Trunk Assessment: Normal  Communication   Communication: No difficulties  Cognition Arousal/Alertness: Awake/alert Behavior During Therapy: WFL for tasks assessed/performed Overall Cognitive Status: Within Functional Limits for tasks assessed                                          General Comments      Exercises     Assessment/Plan    PT Assessment Patient does not need any further PT services;All further PT needs can be met in the next venue of care  PT Problem List Decreased strength;Decreased range of motion;Decreased activity tolerance;Decreased mobility;Decreased balance;Decreased knowledge of use of DME;Decreased safety awareness;Decreased knowledge of precautions;Pain       PT Treatment Interventions      PT Goals (Current goals can be found in the Care Plan section)  Acute Rehab PT Goals Patient Stated Goal: get better PT Goal Formulation: All assessment and education complete, DC therapy Time For Goal Achievement: 09/26/21 Potential to Achieve Goals: Good    Frequency     Barriers to discharge        Co-evaluation               AM-PAC PT "6 Clicks" Mobility  Outcome Measure Help needed turning from your back to your side while in a flat bed without using bedrails?: None Help needed moving from lying on your back to sitting on the side of a flat bed without using bedrails?: None Help needed moving to and from a bed to a chair (including a wheelchair)?: A Little Help needed standing up from a chair using your arms (e.g., wheelchair or bedside chair)?: A Little Help needed to walk in hospital room?: A Little Help needed climbing 3-5 steps with a railing? : A Little 6 Click Score: 20    End of Session   Activity Tolerance: Patient tolerated treatment well Patient left: in bed Nurse Communication: Mobility status PT Visit Diagnosis: Muscle weakness (generalized) (M62.81);Pain Pain -  Right/Left: Right Pain - part of body: Knee    Time: 4166-0630 PT Time Calculation (min) (ACUTE ONLY): 18 min   Charges:   PT Evaluation $PT Eval Moderate Complexity: 1 Mod          Verner Mould, DPT Acute Rehabilitation Services Office [phone] Pager [phone]   Jacques Navy 09/19/2021, 4:49 PM

## 2021-09-19 NOTE — Progress Notes (Signed)
   09/19/21 2200  Psych Admission Type (Psych Patients Only)  Admission Status Voluntary  Psychosocial Assessment  Patient Complaints Depression  Eye Contact Fair;Brief  Facial Expression Worried  Affect Appropriate to circumstance  Speech Soft  Interaction Assertive  Motor Activity Unsteady  Appearance/Hygiene Unremarkable  Behavior Characteristics Cooperative  Mood Anxious;Sad  Aggressive Behavior  Effect No apparent injury  Thought Process  Coherency WDL  Content WDL  Delusions None reported or observed  Perception WDL  Hallucination None reported or observed  Judgment Poor  Confusion None  Danger to Self  Current suicidal ideation? Passive  Self-Injurious Behavior No self-injurious ideation or behavior indicators observed or expressed   Agreement Not to Harm Self Yes  Description of Agreement Verbal contract  Danger to Others  Danger to Others None reported or observed

## 2021-09-19 NOTE — Consult Note (Signed)
Medical Consultation  [name]:[id number] DOB: 1966/01/20 DOA: 09/18/2021 PCP: Medicine, [facility]   Requesting physician: Dr. Nelda Marseille Date of consultation: 09/19/21 Reason for consultation: Asthma  Impression/Recommendations Asthma exacerbation     - she's moving good air, but still has some wheeze and cough     - started on  flovent, PRN pulmicort, albuterol; continue     - started on PRN guaifenesin     - start short steroid burst and loratadine     - she is ok on RA right now     - check CXR   Remainder per primary team.  [facility] will follow-up again tomorrow. Please contact me if I can be of assistance in the meanwhile. Thank you for this consultation.  Chief Complaint: Suicide ideation and attempt  HPI:  Denise Knight is a 55 y.o. female with medical history significant of MDD, bipolar, PTSD, asthma. Presenting to [facility] w/ attempted suicide by APAP OD. She has apparently had previous admissions for the same. During her work up, she was noted to have cough and wheeze. She reported a history of asthma. She reports compliance with her medications, but she has not seen her asthma doctor in quite awhile. She reports that she has had a persistent cough for the last several days. She has taken breathing treatments, but it has not resolved. She has not had any fever. She denies any sick contacts. She reports that she normally take allergies medicine, but has not taken them in a while. She otherwise denies complaints. [facility] was called for asthma exacerbation recs.   Review of Systems:  ROS of systems is negative for all not mentioned in HPI.  Past Medical History:  Diagnosis Date   Allergy    seasonal   Anxiety    Arthritis    "left knee" (07/05/2016)   Asthma    Bipolar 1 disorder ([place])    Depression    Diabetes mellitus without complication (HCC)    pre-   GERD (gastroesophageal reflux disease)    Hypertension    MI (mitral incompetence)     Post traumatic stress disorder    Schizophrenia ([place])    Stomach ulcer    Substance abuse ([place])    cocaine quit july 2020   Suicide attempt [facility])    Past Surgical History:  Procedure Laterality Date   CARDIAC CATHETERIZATION  07/05/2016   CARDIAC CATHETERIZATION N/A 07/05/2016   Procedure: Left Heart Cath and Coronary Angiography;  Surgeon: Adrian Prows, MD;  Location: [facility];  Service: Cardiovascular;  Laterality: N/A;   CYST EXCISION Right    "wrist"   DILATION AND CURETTAGE OF UTERUS     FOOT SURGERY Bilateral    "corns removed"   LEFT HEART CATH AND CORONARY ANGIOGRAPHY N/A 12/24/2017   Procedure: LEFT HEART CATH AND CORONARY ANGIOGRAPHY;  Surgeon: Nigel Mormon, MD;  Location: [facility];  Service: Cardiovascular;  Laterality: N/A;   TUBAL LIGATION     Social History:  reports that she has been smoking cigarettes. She has a 8.75 pack-year smoking history. She has never used smokeless tobacco. She reports current alcohol use of about 7.0 standard drinks per week. She reports current drug use. Drug: Marijuana.  Allergies  Allergen Reactions   Aspirin Hives   Food Hives    "Regular butter"   Morphine And Related Hives   Family History  Problem Relation Age of Onset   Breast cancer Other  Lung cancer Other    Congestive Heart Failure Other    Diabetes Other    Hypertension Other    Colon cancer Neg Hx    Colon polyps Neg Hx    Esophageal cancer Neg Hx    Stomach cancer Neg Hx    Rectal cancer Neg Hx     Prior to Admission medications   Medication Sig Start Date End Date Taking? Authorizing Provider  albuterol (VENTOLIN HFA) 108 (90 Base) MCG/ACT inhaler Inhale 1-2 puffs into the lungs every 6 (six) hours as needed for wheezing or shortness of breath. 10/13/20   Loura Halt A, NP  amLODipine (NORVASC) 10 MG tablet Take 10 mg by mouth every morning. 05/03/21   [provider]  budesonide (PULMICORT) 0.25 MG/2ML nebulizer solution Take 2  mLs (0.25 mg total) by nebulization 2 (two) times daily. Patient not taking: No sig reported 09/13/21 10/13/21  Merrily Brittle, DO  amantadine (SYMMETREL) 100 MG capsule Take 1 capsule (100 mg total) by mouth 2 (two) times daily. 01/17/20 10/13/20  Connye Burkitt, NP   Physical Exam: Blood pressure 116/84, pulse (!) 101, temperature 98.1 F (36.7 C), temperature source Oral, resp. rate 18, height 5' 8"  (1.727 m), weight 88.5 kg, SpO2 100 %. Vitals:   09/19/21 0613 09/19/21 0615  BP: 109/79 116/84  Pulse: 96 (!) 101  Resp:    Temp: 98.1 F (36.7 C)   SpO2:      General: 55 y.o. female resting in chair in NAD Eyes: PERRL, normal sclera ENMT: Nares patent w/o discharge, orophaynx clear, dentition normal, ears w/o discharge/lesions/ulcers Neck: Supple, trachea midline Cardiovascular: RRR, +S1, S2, no m/g/r, equal pulses throughout Respiratory: scattered soft wheeze, good air movement overall, normal WOB on RA GI: BS+, NDNT, no masses noted, no organomegaly noted MSK: No e/c/c Psyc: Appropriate interaction and affect, calm/cooperative  Labs on Admission:  Basic Metabolic Panel: Recent Labs  Lab 09/16/21 1014 09/16/21 1029 09/16/21 1949 09/17/21 0322 09/17/21 1314 09/18/21 0245  NA 135  --   --   --  137 138  K 4.1  --   --   --  3.6 3.6  CL 102  --   --   --  109 110  CO2 22  --   --   --  22 22  GLUCOSE 114*  --   --   --  79 86  BUN 16  --   --   --  10 12  CREATININE 1.35*  --   --   --  1.06* 1.02*  CALCIUM 9.4  --   --   --  8.6* 9.0  MG  --  2.2  --   --   --   --   PHOS  --   --  4.7* 2.9  --   --    Liver Function Tests: Recent Labs  Lab 09/16/21 1014 09/17/21 1314 09/18/21 0245  AST 38 20 19  ALT 84* 54* 48*  ALKPHOS 78 59 61  BILITOT 1.2 0.8 0.6  PROT 7.9 6.6 6.6  ALBUMIN 4.1 3.3* 3.4*   No results for input(s): LIPASE, AMYLASE in the last 168 hours. No results for input(s): AMMONIA in the last 168 hours. CBC: Recent Labs  Lab 09/16/21 1014  WBC 14.8*   NEUTROABS 7.4  HGB 12.7  HCT 39.0  MCV 82.6  PLT 354   Cardiac Enzymes: No results for input(s): CKTOTAL, CKMB, CKMBINDEX, TROPONINI in the last 168 hours. BNP:  Invalid input(s): POCBNP CBG: Recent Labs  Lab 09/17/21 1134 09/17/21 1753 09/17/21 2337 09/18/21 0602 09/18/21 1114  GLUCAP 92 102* 103* 72 93    Radiological Exams on Admission: No results found.  EKG: None obtained by primary.  Time spent: 35 minutes  [facility]  If 7PM-7AM, please contact night-coverage [email or web address] 09/19/2021, 4:07 PM

## 2021-09-19 NOTE — Progress Notes (Signed)
Pt did not attend group.

## 2021-09-19 NOTE — Progress Notes (Signed)
Patient was unable to participate in admission assessment due to coughing and shortness of breath. Verbal order given from Margorie John PA to administer nebulizer treatment early. Patient is currently asleep in room and RN will continue to assess and monitor.

## 2021-09-20 ENCOUNTER — Inpatient Hospital Stay (HOSPITAL_COMMUNITY)
Admit: 2021-09-20 | Discharge: 2021-09-20 | Disposition: A | Discharge status: Home / Self Care | Attending: Emergency Medicine | Admitting: Emergency Medicine

## 2021-09-20 DIAGNOSIS — R059 Cough, unspecified: Secondary | ICD-10-CM | POA: Diagnosis present

## 2021-09-20 DIAGNOSIS — J45909 Unspecified asthma, uncomplicated: Secondary | ICD-10-CM | POA: Diagnosis present

## 2021-09-20 DIAGNOSIS — J45901 Unspecified asthma with (acute) exacerbation: Secondary | ICD-10-CM | POA: Diagnosis present

## 2021-09-20 IMAGING — DX DG CHEST 2V
2 series · 2 of 2 positions shown · non-contrast
Comparison: [DATE]

CLINICAL DATA: Cough with short of breath

EXAM:
CHEST - 2 VIEW

[chest pa]
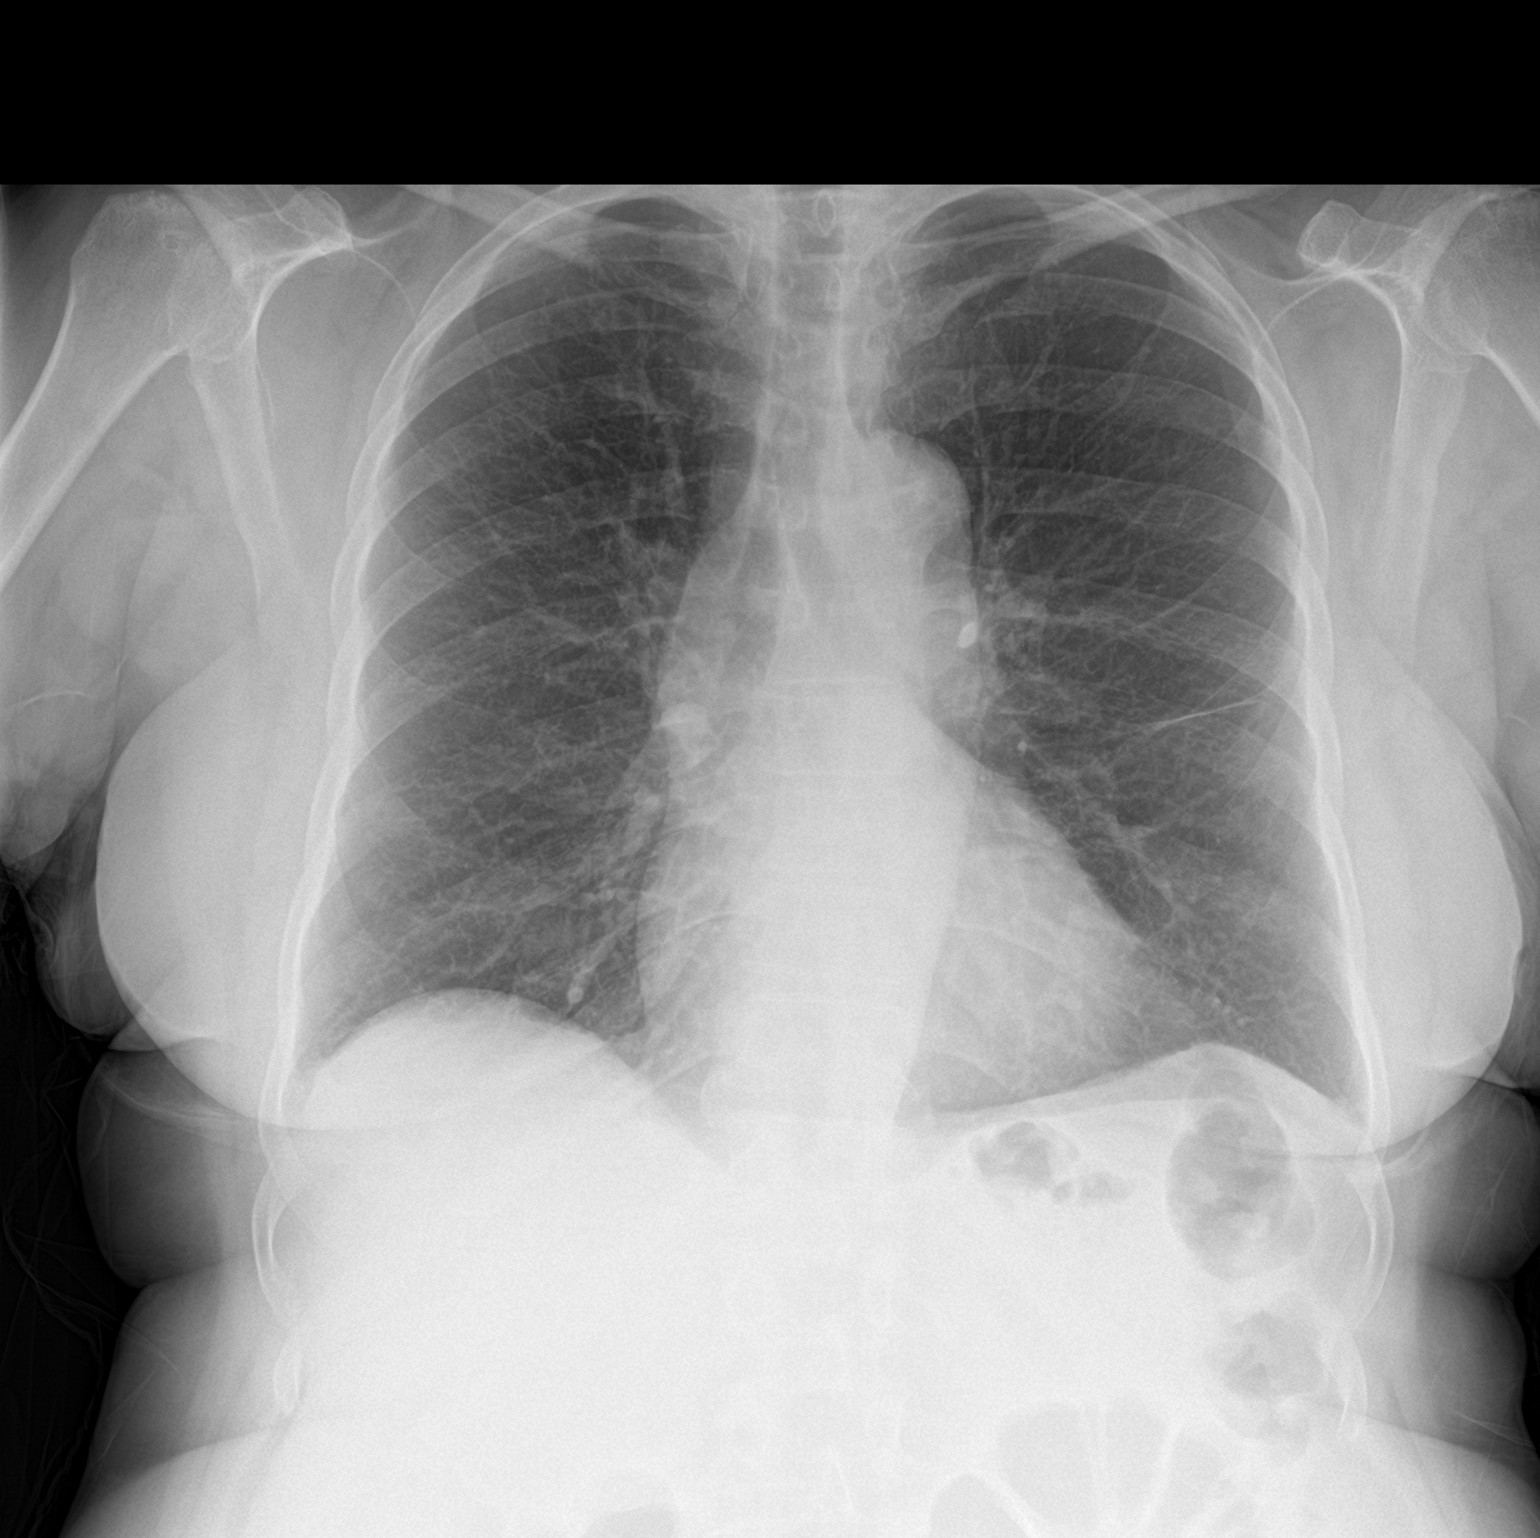

[chest lat]
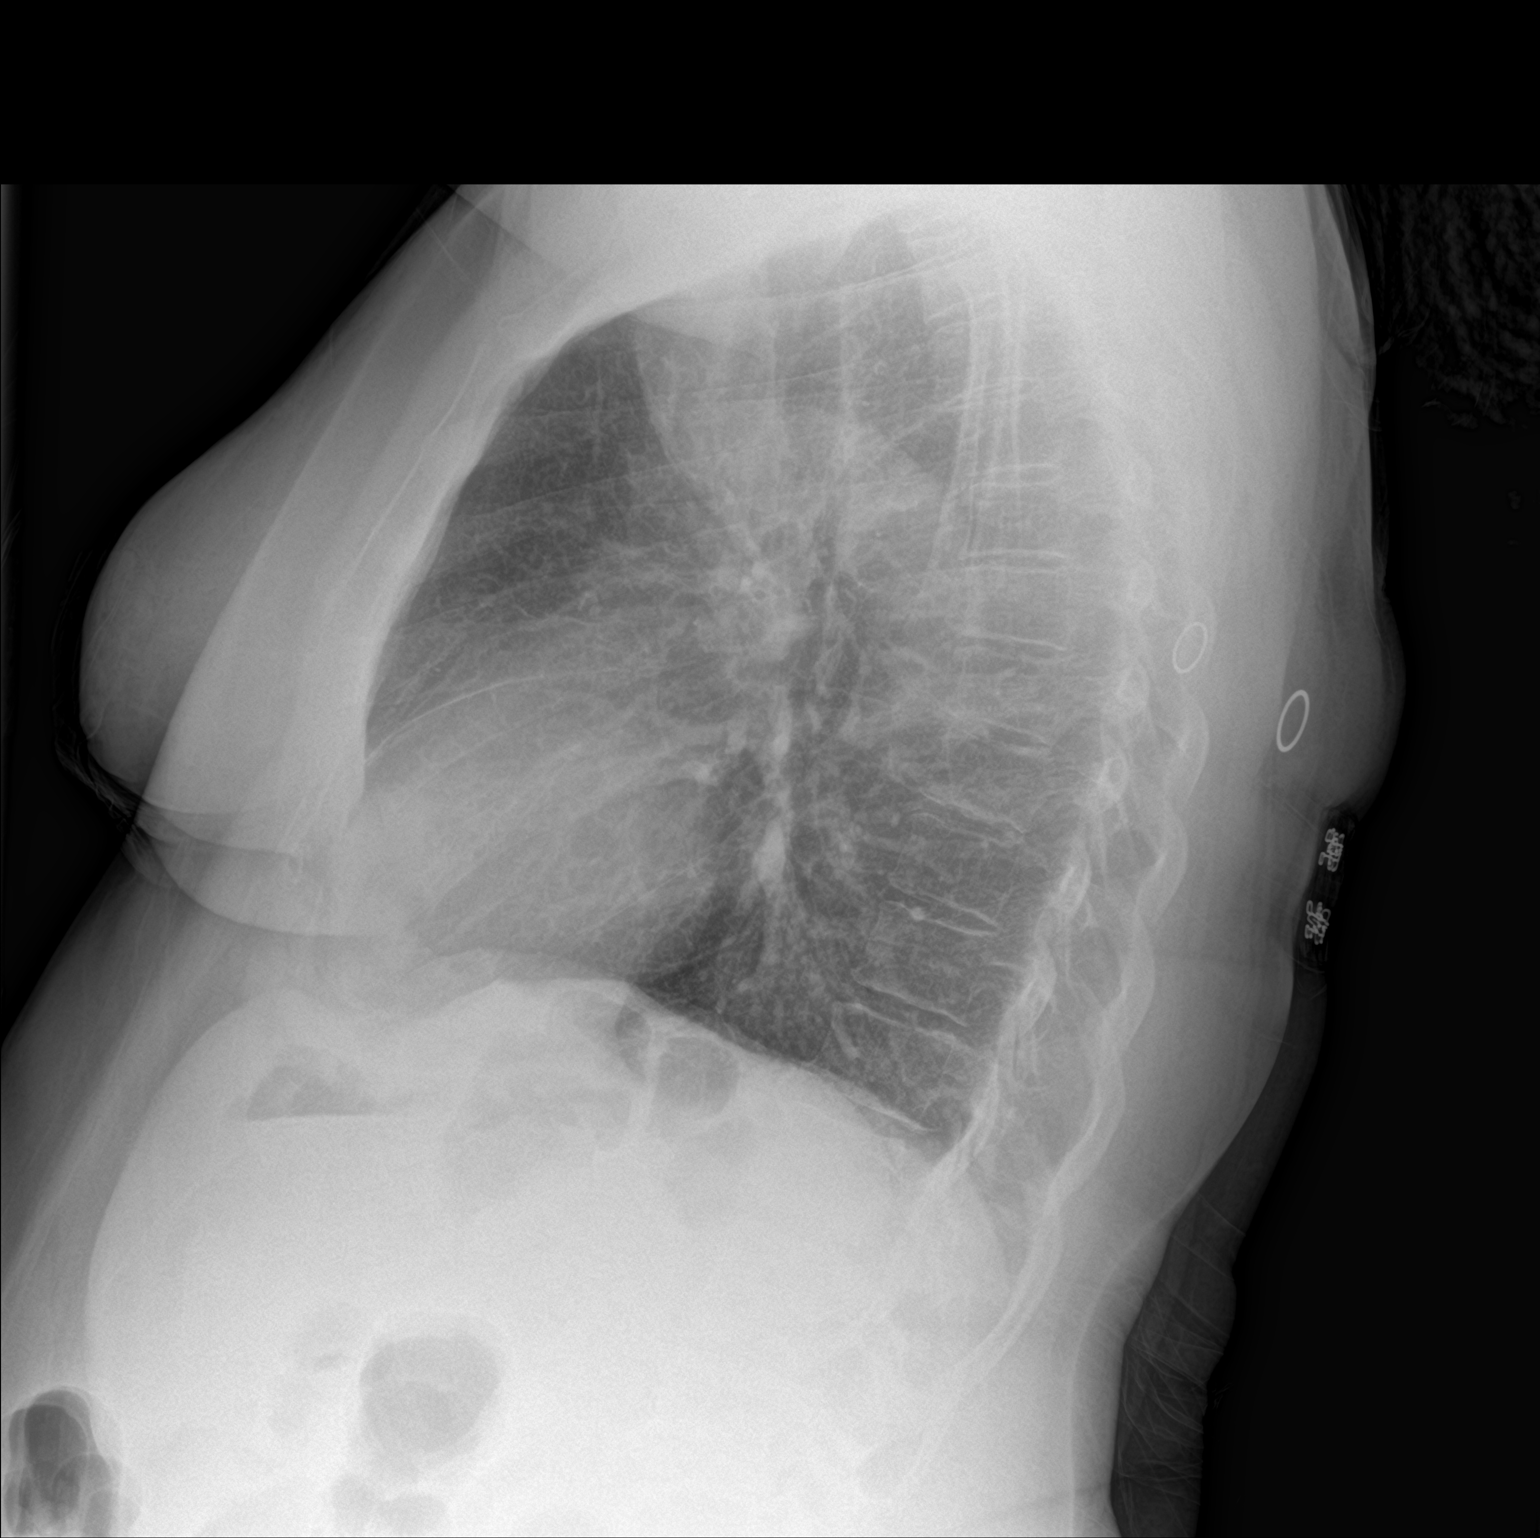

[2 of 2 positions shown; findings below may reference images not displayed]

FINDINGS: The heart size and mediastinal contours are within normal limits.
Both lungs are clear. The visualized skeletal structures are
unremarkable. Linear scarring or atelectasis in the left mid lung.
IMPRESSION: No active cardiopulmonary disease.

## 2021-09-20 MED ORDER — QUETIAPINE FUMARATE 100 MG PO TABS
100.0000 mg | ORAL_TABLET | Freq: Every day | ORAL | Status: DC
  Administered 2021-09-20 – 2021-09-21 (×2): 100 mg via ORAL
  Filled 2021-09-20 (×6): qty 1

## 2021-09-20 MED ORDER — GUAIFENESIN 100 MG/5ML PO LIQD
10.0000 mL | ORAL | Status: DC | PRN
  Administered 2021-09-20 – 2021-09-21 (×3): 10 mL via ORAL
  Filled 2021-09-20 (×2): qty 10

## 2021-09-20 MED ORDER — MUPIROCIN 2 % EX OINT
TOPICAL_OINTMENT | Freq: Two times a day (BID) | CUTANEOUS | Status: DC
  Administered 2021-09-22 – 2021-09-24 (×4): 1 via NASAL
  Filled 2021-09-20: qty 22

## 2021-09-20 NOTE — Progress Notes (Addendum)
[facility] Progress Note  09/20/2021 12:29 PM Ashyia Schraeder  MRN:  [id number]  Subjective:  Patient said she is doing a little better today. Patient is walking today instead of maneuvering in wheelchair after being cleared by physical therapy yesterday afternoon. She is currently limping, and she says this is because of the arthritis in her right knee, which has been aggravated by the weather change. She believes she needs Voltaren gel and was advised that the Prednisone she is taking for her asthma will likely help her joint pain. She is concerned that she was told she has MRSA. She was reassured that this is likely asymptomatic colonization and that we are treating with bactroban nasal ointment.  She denies SI/HI/AVH, ideas of reference, first ranks symptoms or delusions. Patient said she is experiencing no withdrawal symptoms or current cravings. She said she did not sleep well at all last night. She is willing to change antipsychotics if this will help her sleep better and states she previously did well on Seroquel. She said her appetite is better this morning. She did not really want breakfast, lunch, or dinner yesterday, but ate well this morning.  She is still coughing, but she says it is not that often. She said she tries to cough something up but cannot because the congestion is very deep in her chest. She denied headache, chest pain, trouble breathing, nausea, and constipation, and she said she has had a bowel movement.    After discussing limited rehab options with her current insurance, she is open to option of [facility] or an [employer] referral with [employer]. The patient said the further she is able to get from [facility] the better to avoid being around influences and people who use. She is optimistic but knows that if she stays in this same environment after discharge, she will return to doing drugs again. She says she will remain compliant with her medications and is in  agreement to continue with the treatment plan.   Principal Problem: Bipolar II disorder, most recent episode major depressive ([place]) Diagnosis: Principal Problem:   Bipolar II disorder, most recent episode major depressive ([place]) Active Problems:   Cocaine abuse ([place])  Total Time Spent in Direct Patient Care:  I personally spent 30 minutes on the unit in direct patient care. The direct patient care time included face-to-face time with the patient, reviewing the patient's chart, communicating with other professionals, and coordinating care. Greater than 50% of this time was spent in counseling or coordinating care with the patient regarding goals of hospitalization, psycho-education, and discharge planning needs.  Past Psychiatric History: See H&P  Past Medical History:  Past Medical History:  Diagnosis Date   Allergy    seasonal   Anxiety    Arthritis    "left knee" (07/05/2016)   Asthma    Bipolar 1 disorder ([place])    Depression    Diabetes mellitus without complication (HCC)    pre-   GERD (gastroesophageal reflux disease)    Hypertension    MI (mitral incompetence)    Post traumatic stress disorder    Schizophrenia ([place])    Stomach ulcer    Substance abuse ([place])    cocaine quit july 2020   Suicide attempt [facility])     Past Surgical History:  Procedure Laterality Date   CARDIAC CATHETERIZATION  07/05/2016   CARDIAC CATHETERIZATION N/A 07/05/2016   Procedure: Left Heart Cath and Coronary Angiography;  Surgeon: Adrian Prows, MD;  Location: [place]  CV LAB;  Service: Cardiovascular;  Laterality: N/A;   CYST EXCISION Right    "wrist"   DILATION AND CURETTAGE OF UTERUS     FOOT SURGERY Bilateral    "corns removed"   LEFT HEART CATH AND CORONARY ANGIOGRAPHY N/A 12/24/2017   Procedure: LEFT HEART CATH AND CORONARY ANGIOGRAPHY;  Surgeon: Nigel Mormon, MD;  Location: [place] CV LAB;  Service: Cardiovascular;  Laterality: N/A;   TUBAL LIGATION     Family History:  Family  History  Problem Relation Age of Onset   Breast cancer Other    Lung cancer Other    Congestive Heart Failure Other    Diabetes Other    Hypertension Other    Colon cancer Neg Hx    Colon polyps Neg Hx    Esophageal cancer Neg Hx    Stomach cancer Neg Hx    Rectal cancer Neg Hx    Family Psychiatric  History: See H&P Social History:  Social History   Substance and Sexual Activity  Alcohol Use Yes   Alcohol/week: 7.0 standard drinks   Types: 7 Cans of beer per week     Social History   Substance and Sexual Activity  Drug Use Yes   Types: Marijuana    Social History   Socioeconomic History   Marital status: Widowed    Spouse name: Not on file   Number of children: 2   Years of education: Not on file   Highest education level: Not on file  Occupational History   Occupation: disabled   Tobacco Use   Smoking status: Some Days    Packs/day: 0.25    Years: 35.00    Pack years: 8.75    Types: Cigarettes    Last attempt to quit: 05/30/2019    Years since quitting: 2.3   Smokeless tobacco: Never  Vaping Use   Vaping Use: Never used  Substance and Sexual Activity   Alcohol use: Yes    Alcohol/week: 7.0 standard drinks    Types: 7 Cans of beer per week   Drug use: Yes    Types: Marijuana   Sexual activity: Yes    Birth control/protection: Surgical  Other Topics Concern   Not on file  Social History Narrative   Not on file   Social Determinants of Health   Financial Resource Strain: Not on file  Food Insecurity: Not on file  Transportation Needs: Not on file  Physical Activity: Not on file  Stress: Not on file  Social Connections: Not on file    Sleep: Poor  Appetite:  Good, improved from yesterday when she had poor appetite  Current Medications: Current Facility-Administered Medications  Medication Dose Route Frequency Provider Last Rate Last Admin   albuterol (PROVENTIL) (2.5 MG/3ML) 0.083% nebulizer solution 2.5 mg  2.5 mg Inhalation Q6H PRN  Suella Broad, FNP   2.5 mg at 09/19/21 0934   albuterol (VENTOLIN HFA) 108 (90 Base) MCG/ACT inhaler 1-2 puff  1-2 puff Inhalation Q6H PRN Nelda Marseille, Noelle Sease E, MD       alum & mag hydroxide-simeth (MAALOX/MYLANTA) 200-200-20 MG/5ML suspension 30 mL  30 mL Oral Q4H PRN Starkes-Perry, Gayland Curry, FNP       amLODipine (NORVASC) tablet 10 mg  10 mg Oral Daily Suella Broad, FNP   10 mg at 09/20/21 0843   fluticasone (FLOVENT HFA) 110 MCG/ACT inhaler 1 puff  1 puff Inhalation BID Harlow Asa, MD   1 puff at 09/20/21 [phone]  folic acid (FOLVITE) tablet 1 mg  1 mg Oral Daily Doda, Vandana, MD   1 mg at 09/20/21 0844   guaiFENesin (ROBITUSSIN) 100 MG/5ML liquid 10 mL  10 mL Oral Q4H PRN Harlow Asa, MD       hydrOXYzine (ATARAX/VISTARIL) tablet 25 mg  25 mg Oral Q6H PRN Harlow Asa, MD   25 mg at 09/19/21 2056   lamoTRIgine (LAMICTAL) tablet 25 mg  25 mg Oral Daily Suella Broad, FNP   25 mg at 09/20/21 2671   loperamide (IMODIUM) capsule 2-4 mg  2-4 mg Oral PRN Harlow Asa, MD       loratadine (CLARITIN) tablet 10 mg  10 mg Oral Daily Marylyn Ishihara, Tyrone A, DO   10 mg at 09/20/21 0843   LORazepam (ATIVAN) tablet 1 mg  1 mg Oral Q6H PRN Harlow Asa, MD       magnesium hydroxide (MILK OF MAGNESIA) suspension 30 mL  30 mL Oral Daily PRN Starkes-Perry, Gayland Curry, FNP       melatonin tablet 5 mg  5 mg Oral QHS Nelda Marseille, Travus Oren E, MD   5 mg at 09/19/21 2056   mirtazapine (REMERON) tablet 15 mg  15 mg Oral QHS Suella Broad, FNP   15 mg at 09/19/21 2056   multivitamin with minerals tablet 1 tablet  1 tablet Oral Daily Harlow Asa, MD   1 tablet at 09/20/21 0843   mupirocin ointment (BACTROBAN) 2 %   Nasal BID Harlow Asa, MD   Given at 09/20/21 2458   nicotine polacrilex (NICORETTE) gum 2 mg  2 mg Oral PRN Harlow Asa, MD       ondansetron (ZOFRAN-ODT) disintegrating tablet 4 mg  4 mg Oral Q6H PRN Harlow Asa, MD       pantoprazole (PROTONIX) EC  tablet 40 mg  40 mg Oral q morning Suella Broad, FNP   40 mg at 09/20/21 0843   predniSONE (DELTASONE) tablet 40 mg  40 mg Oral Daily Kyle, Tyrone A, DO   40 mg at 09/20/21 0843   risperiDONE (RISPERDAL) tablet 2 mg  2 mg Oral QHS Suella Broad, FNP   2 mg at 09/19/21 2056   simvastatin (ZOCOR) tablet 40 mg  40 mg Oral Q supper Suella Broad, FNP   40 mg at 09/19/21 1821   sodium chloride (OCEAN) 0.65 % nasal spray 1 spray  1 spray Each Nare PRN Suella Broad, FNP       thiamine tablet 100 mg  100 mg Oral Daily Nelda Marseille, Kymir Coles E, MD   100 mg at 09/20/21 0998   vitamin B-12 (CYANOCOBALAMIN) tablet 1,000 mcg  1,000 mcg Oral Daily Suella Broad, FNP   1,000 mcg at 09/20/21 1214   Vitamin D (Ergocalciferol) (DRISDOL) capsule 50,000 Units  50,000 Units Oral Q7 days Harlow Asa, MD   50,000 Units at 09/19/21 1824    Lab Results:  Results for orders placed or performed during the hospital encounter of 09/18/21 (from the past 48 hour(s))  CBC with Differential/Platelet     Status: Abnormal   Collection Time: 09/19/21  6:29 PM  Result Value Ref Range   WBC 8.3 4.0 - 10.5 K/uL   RBC 4.23 3.87 - 5.11 MIL/uL   Hemoglobin 11.4 (L) 12.0 - 15.0 g/dL   HCT 36.8 36.0 - 46.0 %   MCV 87.0 80.0 - 100.0 fL   MCH 27.0 26.0 - 34.0 pg  MCHC 31.0 30.0 - 36.0 g/dL   RDW 17.6 (H) 11.5 - 15.5 %   Platelets 320 150 - 400 K/uL   nRBC 0.0 0.0 - 0.2 %   Neutrophils Relative % 56 %   Neutro Abs 4.7 1.7 - 7.7 K/uL   Lymphocytes Relative 29 %   Lymphs Abs 2.4 0.7 - 4.0 K/uL   Monocytes Relative 7 %   Monocytes Absolute 0.6 0.1 - 1.0 K/uL   Eosinophils Relative 7 %   Eosinophils Absolute 0.6 (H) 0.0 - 0.5 K/uL   Basophils Relative 1 %   Basophils Absolute 0.1 0.0 - 0.1 K/uL   Immature Granulocytes 0 %   Abs Immature Granulocytes 0.03 0.00 - 0.07 K/uL    Comment: Performed at [facility], [place] [address], [place], [place] [address]  Comprehensive  metabolic panel     Status: Abnormal   Collection Time: 09/19/21  6:29 PM  Result Value Ref Range   Sodium 137 135 - 145 mmol/L   Potassium 4.2 3.5 - 5.1 mmol/L   Chloride 105 98 - 111 mmol/L   CO2 26 22 - 32 mmol/L   Glucose, Bld 101 (H) 70 - 99 mg/dL    Comment: Glucose reference range applies only to samples taken after fasting for at least 8 hours.   BUN 12 6 - 20 mg/dL   Creatinine, Ser 1.10 (H) 0.44 - 1.00 mg/dL   Calcium 9.4 8.9 - 10.3 mg/dL   Total Protein 7.5 6.5 - 8.1 g/dL   Albumin 3.9 3.5 - 5.0 g/dL   AST 17 15 - 41 U/L   ALT 36 0 - 44 U/L   Alkaline Phosphatase 66 38 - 126 U/L   Total Bilirubin 0.4 0.3 - 1.2 mg/dL   GFR, Estimated 59 (L) >60 mL/min    Comment: (NOTE) Calculated using the CKD-EPI Creatinine Equation (2021)    Anion gap 6 5 - 15    Comment: Performed at [facility], [place] [address] [place], [place] [address]    Blood Alcohol level:  Lab Results  Component Value Date   [facility] <10 09/16/2021   ETH <10 92/33/0076    Metabolic Disorder Labs: Lab Results  Component Value Date   HGBA1C 5.7 (H) 09/03/2021   MPG 116.89 09/03/2021   MPG 116.89 01/13/2020   No results found for: PROLACTIN Lab Results  Component Value Date   CHOL 230 (H) 09/03/2021   TRIG 111 09/03/2021   HDL 62 09/03/2021   CHOLHDL 3.7 09/03/2021   VLDL 22 09/03/2021   LDLCALC 146 (H) 09/03/2021   LDLCALC 114 (H) 01/13/2020    Physical Findings: CIWA:  CIWA-Ar Total: 2  Musculoskeletal: Strength & Muscle Tone: within normal limits Gait & Station:  Walking with a limp Patient leans:  Did not notice any leaning  Psychiatric Specialty Exam:  Presentation  General Appearance: Casual, dress appropriate for environment  Eye Contact:Fair  Speech:Clear and Coherent; Slow  Speech Volume:Decreased  Handedness:Right   Mood and Affect  Mood:Anxious; Depressed  Affect:Depressed; Flat, Mood-congruent   Thought Process  Thought Processes:Linear;  Coherent  Descriptions of Associations:Intact  Orientation:Full (Time, Place and Person)  Thought Content:Logical - denies AVH, paranoia, or delusions   History of Schizophrenia/Schizoaffective disorder:No  Hallucinations:Denied Ideas of Reference:Denied  Suicidal Thoughts:Denied Homicidal Thoughts:Denied  Sensorium  Memory:Immediate Fair; Recent Fair; Remote [place]  Insight:Fair   Executive Functions  Concentration:Fair  Attention Span:Fair  [place]  Language:Fair  Psychomotor Activity  Psychomotor Activity:No agitation or retardation  Assets  Assets:Communication Skills; Desire for Improvement; Financial Resources/Insurance; Housing; Physical Health; Leisure Time   Sleep  Sleep:6.75 hours  Physical Exam Vitals reviewed.  Constitutional:      General: She is not in acute distress. HENT:     Head: Normocephalic and atraumatic.  Pulmonary:     Effort: Pulmonary effort is normal.  Neurological:     Mental Status: She is alert and oriented to person, place, and time.   Review of Systems  Constitutional:  Positive for malaise/fatigue. Negative for chills, diaphoresis, fever and weight loss.  HENT:  Positive for congestion.   Respiratory:  Positive for cough. Negative for shortness of breath.   Cardiovascular:  Negative for chest pain and leg swelling.  Gastrointestinal:  Negative for constipation, diarrhea, nausea and vomiting.  Genitourinary:  Negative for dysuria.  Musculoskeletal:  Positive for joint pain.       Positive for right knee pain due to arthritis.  All other systems reviewed and are negative. Blood pressure (!) 145/89, pulse 88, temperature 97.8 F (36.6 C), temperature source Oral, resp. rate 18, height 5' 8"  (1.727 m), weight 88.5 kg, SpO2 100 %. Body mass index is 29.67 kg/m.   Treatment Plan Summary: Daily contact with patient to assess and evaluate symptoms and progress in treatment and  Medication management   ASSESSMENT Active Problems:   Bipolar II MRE depressed severe without current psychotic features   PTSD by hx   Stimulant use d/o - cocaine type   Alcohol use d/o by hx   Cannabis use d/o in early remission   Vitamin D deficiency   Hyperlipidemia   Asthma   HTN   GERD   Ms. Deneisha Dade is a 55 year-old female with reported PPHx of depression, bipolar, PTSD who was admitted to [facility] from [place] ED due to suicide attempt with 32 pills of Tylenol PM. Patient was restarted on several medications, and she desires improvement in her mental state and sleep. Desires rehabilitation following discharge.   Labs reviewed:  CBC (11/9) - hemoglobin 11.4 CMP (11/9) - creatinine 1.10  PLAN Psychiatric Problems Bipolar II MRE depressed severe without current psychotic features PTSD by hx R/o substance induced mood d/o - ECG 11/8 was normal, QTC 450m - Starting Seroquel 1064mqhs to improve sleep and help with mood stabilization - Discontinuing Risperdal 57m39mhs - Continue Remeron 76m20mily at bedtime. - Continue Lamictal 25mg46mly - Continue melatonin 5mg d107my at bedtime. - Stopped amantadine, which had been added for help with cocaine cravings to avoid return of psychosis as noted during previous inpatient admission.   Stimulant use d/o - cocaine type  Alcohol use d/o by hx  Cannabis use d/o in early remission - CIWA with Ativan as needed for CIWA greater than 10 (most recent CIWA 2) - Continue thiamine 100mg d357m. - Continue multivitamin with minerals daily. - Continue folic acid 1mg dai73m - Continue Zofran 4mg q6 P457mfor nausea or vomiting. - Continue Imodium 2-4mg PRN f58mdiarrhea or loose stools for 72 hours. - [facility]    Medical Problems  Asthma Cough/SOB - Medicine was consulted 11/9 and Dr. Kyle recomMarylyn Ishiharaations as follows:      - Continue Robitussin 100mg/5mL s157m q4 PRN      -  Continue albuterol, Pulmicort, Flovent       - Continue steroid burst with prednisone 40mg daily 57m5  days (11/10 Day 2) - monitoring for psychosis or mood escalation on steroid      - Continue loratadine 59m daily      - Chest x-ray ordered - Patient was made aware that Robitussin is available PRN  HTN - Last blood pressure: 141/92 - Continue Norvasc 186m- CTM with daily vitals   Hyperlipidemia - Continue simvastatin 402m  GERD - Continue Protonix 43m71mVitamin Deficiencies - Continue Vitamin B12 and Vitamin D    Weakness Physical therapy evaluated yesterday, patient given permission to ambulate. Will CTM.   Nicotine dependence Nicotine gum as needed for smoking cessation   MRSA positive - Start bactroban nasal BID X 7 days per pharmacist recommendation  Recent AKI s/p overdose - Repeat creatinine 1.10 - continue to encourage po intake/hydration and trend  Leukocytosis - resolved - Repeat WBC 8.3 on 09/19/21  Elevated ALT s/p overdose - resolved - Repeat ALT 36 and AST 17   3. Safety and Monitoring: Involuntary admission to inpatient psychiatric unit for safety, stabilization and treatment Daily contact with patient to assess and evaluate symptoms and progress in treatment Patient's case to be discussed in multi-disciplinary team meeting Observation Level : q15 minute checks Vital signs: q12 hours Precautions: suicide, elopement   4. Discharge Planning: Social work and case management to assist with discharge planning and identification of hospital follow-up needs prior to discharge Estimated LOS: 5-7 days Discharge Concerns: Need to establish a safety plan; Medication compliance and effectiveness Discharge Goals: [place] [place], needs outpatient referrals for mental health follow-up including medication management/psychotherapy     I certify that inpatient services furnished can reasonably be  expected to improve the patient's condition.    JonaDanie Chandlerdical Student 09/20/2021, 12:29 PM Attestation for Student Documentation:  I certify that I saw and interviewed the patient together with the medical student and was present for the duration of the interview.  I reviewed the medical record.  I performed or reperformed the mental status examination of the patient as indicated.  I formulated the assessment and plan of treatment as documented above. Elsa Ploch Viann Fish, FAPAAlda Ponder

## 2021-09-20 NOTE — Progress Notes (Signed)
   09/20/21 1300  Psych Admission Type (Psych Patients Only)  Admission Status Voluntary  Psychosocial Assessment  Patient Complaints Anxiety  Eye Contact Fair;Brief  Facial Expression Worried;Sad  Affect Appropriate to circumstance  Speech Soft  Interaction Assertive  Motor Activity Unsteady  Appearance/Hygiene Unremarkable  Behavior Characteristics Cooperative  Mood Anxious;Sad  Aggressive Behavior  Effect No apparent injury  Thought Process  Coherency WDL  Content WDL  Delusions None reported or observed  Perception WDL  Hallucination None reported or observed  Judgment Poor  Confusion None  Danger to Self  Current suicidal ideation? Passive  Self-Injurious Behavior No self-injurious ideation or behavior indicators observed or expressed   Agreement Not to Harm Self Yes  Description of Agreement Verbal contract  Danger to Others  Danger to Others None reported or observed

## 2021-09-20 NOTE — BHH Group Notes (Signed)
Relaxation and Psycho-ed Group were combined. Healthy coping skills were discussed as it attributes to promoting mental well being. Discussed with patients that mental health journey is like a pie, and that healthy coping skills add value to mental well being. Identified unhealthy coping skills and how they play negative impact, and identified positive healthy coping skills that are free, accessible and easily implemented. Closed group with deep breathing practice and education regarding stress reduction. Pt attended and was appropriate.

## 2021-09-20 NOTE — Progress Notes (Signed)
CSW met with patient to provide options for [facility] and [facility].  CSW provided both available options in [place] as well as education about policies associated with both options.  Patient reports interest in going to [facility] to assist with triggers associated with her substance use.    CSW got off the phone with representative with [facility] [place].  Representative reports that they currently have a female bed available in [place], [place]. They are only able to hold a bed for 48 hours so if patient participated in prescreener today they could hold the bed for Saturday.  If patient is not ready for discharge, then patient will need to call 2 days before discharge to see about bed availability and they will hold the bed for 2 days.    Cantrell Larouche, LCSW, [place] Social Worker  [facility]

## 2021-09-20 NOTE — BHH Group Notes (Signed)
Adult Psychoeducational Group Note  Date:  09/20/2021 Time:  9:19 PM  Group Topic/Focus:  Wrap-Up Group:   The focus of this group is to help patients review their daily goal of treatment and discuss progress on daily workbooks.  Participation Level:  Active  Participation Quality:  Appropriate  Affect:  Appropriate  Cognitive:  Alert  Insight: Appropriate  Engagement in Group:  Engaged  Modes of Intervention:  Discussion  Additional Comments:  Patient attended and participated in the wrap-up group.  Annie Sable 09/20/2021, 9:19 PM

## 2021-09-20 NOTE — Assessment & Plan Note (Signed)
-  Continue home amlodipine

## 2021-09-20 NOTE — Assessment & Plan Note (Addendum)
Patient with a history of asthma which can be exacerbated by seasons per patient report. She uses an albuterol inhaler and nebulizer as an outpatient. No chest pain or hypoxia. Dyspnea has now improved with continued treatment. Chest x-ray unremarkable. -Continue prednisone for a total 5 day burst -Continue albuterol prn, flovent  On Discharge: -Recommend to send prescriptions for albuterol HFA and albuterol nebulizer solution; patient is unsure what pharmacy to send at the moment so I did not order -Can continue Flovent and switch to home budesonide nebulizer once discharged back home; she should not use both Flovent and budesonide nebulizer. Since she will be going to another facility, she can continue Flovent on discharge with instructions to switch to budesonide once she has returned home

## 2021-09-20 NOTE — Progress Notes (Signed)
Patient ID: Denise Knight, female   DOB: 01-04-1966, 55 y.o.   MRN: [id number] Patient returned from radiology at 1730. Patient went to [facility] for chest x-ray.

## 2021-09-20 NOTE — Progress Notes (Signed)
PROGRESS NOTE    Denise Knight  IBB:[id number] DOB: [birth year] DOA: 09/18/2021 PCP: Medicine, [facility]   Brief Narrative: Denise Knight is a 55 y.o. female with a history of major depressive disorder, bipolar disorder, PTSD, asthma. Patient presented to [facility] after suicide attempt. Medicine consulted for cough and concern for asthma exacerbation.   Assessment & Plan:   Cough Patient with a history of asthma which can be exacerbated by seasons per patient report. She uses an albuterol inhaler and nebulizer as an outpatient. No chest pain or hypoxia. Continues to have dyspnea on exertion. No wheezing on exam this afternoon. Unlikely PE. -Continue to treat as possible asthma exacerbation with prednisone, albuterol prn; discontinue PRN budesonide -Follow-up on chest x-ray to rule out atypical pneumonia; if concerning findings, will obtain a respiratory virus panel  Primary hypertension -Continue home amlodipine   DVT prophylaxis: Per primary Code Status:   Code Status: Full Code Family Communication: None Disposition Plan: Per primary   Procedures:  None  Antimicrobials: None    Subjective: Patient with persistent cough. Non-productive. Dyspnea with ambulation.  Objective: Vitals:   09/19/21 0615 09/19/21 1629 09/20/21 0607 09/20/21 1300  BP: 116/84 95/72 (!) 145/89 (!) 141/92  Pulse: (!) 101 97 88 (!) 111  Resp:    18  Temp:   97.8 F (36.6 C) 98.3 F (36.8 C)  TempSrc:   Oral Oral  SpO2:      Weight:      Height:       No intake or output data in the 24 hours ending 09/20/21 1518 Filed Weights   09/18/21 2115  Weight: 88.5 kg    Examination:  General exam: Appears calm and comfortable Respiratory system: No wheezing bilaterally but did hear some faint railes Cardiovascular system: S1 & S2 heard, RRR. No murmurs, rubs, gallops or clicks. Gastrointestinal system: Abdomen is nondistended, soft and nontender. No organomegaly or  masses felt. Normal bowel sounds heard. Central nervous system: Alert and oriented. No focal neurological deficits. Musculoskeletal: No edema. No calf tenderness Skin: No cyanosis. No rashes    Data Reviewed: I have personally reviewed following labs and imaging studies  CBC Lab Results  Component Value Date   WBC 8.3 09/19/2021   RBC 4.23 09/19/2021   HGB 11.4 (L) 09/19/2021   HCT 36.8 09/19/2021   MCV 87.0 09/19/2021   MCH 27.0 09/19/2021   PLT 320 09/19/2021   MCHC 31.0 09/19/2021   RDW 17.6 (H) 09/19/2021   LYMPHSABS 2.4 09/19/2021   MONOABS 0.6 09/19/2021   EOSABS 0.6 (H) 09/19/2021   BASOSABS 0.1 45/01/8881     Last metabolic panel Lab Results  Component Value Date   NA 137 09/19/2021   K 4.2 09/19/2021   CL 105 09/19/2021   CO2 26 09/19/2021   BUN 12 09/19/2021   CREATININE 1.10 (H) 09/19/2021   GLUCOSE 101 (H) 09/19/2021   GFRNONAA 59 (L) 09/19/2021   GFRAA >60 07/28/2020   CALCIUM 9.4 09/19/2021   PHOS 2.9 09/17/2021   PROT 7.5 09/19/2021   ALBUMIN 3.9 09/19/2021   BILITOT 0.4 09/19/2021   ALKPHOS 66 09/19/2021   AST 17 09/19/2021   ALT 36 09/19/2021   ANIONGAP 6 09/19/2021    CBG (last 3)  Recent Labs    09/17/21 2337 09/18/21 0602 09/18/21 1114  GLUCAP 103* 72 93     GFR: Estimated Creatinine Clearance: 66.4 mL/min (A) (by C-G formula based on SCr of 1.1 mg/dL (H)).  Coagulation Profile: Recent Labs  Lab 09/16/21 2051 09/17/21 0322 09/17/21 1314  INR 1.0 1.1 1.1    Recent Results (from the past 240 hour(s))  Resp Panel by RT-PCR (Flu A&B, Covid) Nasopharyngeal Swab     Status: None   Collection Time: 09/16/21 10:45 AM   Specimen: Nasopharyngeal Swab; Nasopharyngeal(NP) swabs in vial transport medium  Result Value Ref Range Status   SARS Coronavirus 2 by RT PCR NEGATIVE NEGATIVE Final    Comment: (NOTE) SARS-CoV-2 target nucleic acids are NOT DETECTED.  The SARS-CoV-2 RNA is generally detectable in upper respiratory specimens  during the acute phase of infection. The lowest concentration of SARS-CoV-2 viral copies this assay can detect is 138 copies/mL. A negative result does not preclude SARS-Cov-2 infection and should not be used as the sole basis for treatment or other patient management decisions. A negative result may occur with  improper specimen collection/handling, submission of specimen other than nasopharyngeal swab, presence of viral mutation(s) within the areas targeted by this assay, and inadequate number of viral copies(<138 copies/mL). A negative result must be combined with clinical observations, patient history, and epidemiological information. The expected result is Negative.  Fact Sheet for Patients:  EntrepreneurPulse.com.au  Fact Sheet for Healthcare Providers:  IncredibleEmployment.be  This test is no t yet approved or cleared by the [place] FDA and  has been authorized for detection and/or diagnosis of SARS-CoV-2 by FDA under an Emergency Use Authorization (EUA). This EUA will remain  in effect (meaning this test can be used) for the duration of the COVID-19 declaration under Section 564(b)(1) of the Act, 21 U.S.C.section 360bbb-3(b)(1), unless the authorization is terminated  or revoked sooner.       Influenza A by PCR NEGATIVE NEGATIVE Final   Influenza B by PCR NEGATIVE NEGATIVE Final    Comment: (NOTE) The Xpert Xpress SARS-CoV-2/FLU/RSV plus assay is intended as an aid in the diagnosis of influenza from Nasopharyngeal swab specimens and should not be used as a sole basis for treatment. Nasal washings and aspirates are unacceptable for Xpert Xpress SARS-CoV-2/FLU/RSV testing.  Fact Sheet for Patients: EntrepreneurPulse.com.au  Fact Sheet for Healthcare Providers: IncredibleEmployment.be  This test is not yet approved or cleared by the [place] FDA and has been authorized for detection  and/or diagnosis of SARS-CoV-2 by FDA under an Emergency Use Authorization (EUA). This EUA will remain in effect (meaning this test can be used) for the duration of the COVID-19 declaration under Section 564(b)(1) of the Act, 21 U.S.C. section 360bbb-3(b)(1), unless the authorization is terminated or revoked.  Performed at [facility], [place] [address] [place], [place] [address]   MRSA Next Gen by PCR, Nasal     Status: Abnormal   Collection Time: 09/17/21 12:15 AM   Specimen: Nasal Mucosa; Nasal Swab  Result Value Ref Range Status   MRSA by PCR Next Gen DETECTED (A) NOT DETECTED Final    Comment: RESULT CALLED TO, READ BACK BY AND VERIFIED WITH: GRAHAM,A RN @0250  ON 09/17/21 JACKSON,K (NOTE) The GeneXpert MRSA Assay (FDA approved for NASAL specimens only), is one component of a comprehensive MRSA colonization surveillance program. It is not intended to diagnose MRSA infection nor to guide or monitor treatment for MRSA infections. Test performance is not FDA approved in patients less than 35 years old. Performed at [facility], [place] [address] [place], [place] [address]   Resp Panel by RT-PCR (Flu A&B, Covid) Nasopharyngeal Swab     Status: None  Collection Time: 09/18/21 12:54 PM   Specimen: Nasopharyngeal Swab; Nasopharyngeal(NP) swabs in vial transport medium  Result Value Ref Range Status   SARS Coronavirus 2 by RT PCR NEGATIVE NEGATIVE Final    Comment: (NOTE) SARS-CoV-2 target nucleic acids are NOT DETECTED.  The SARS-CoV-2 RNA is generally detectable in upper respiratory specimens during the acute phase of infection. The lowest concentration of SARS-CoV-2 viral copies this assay can detect is 138 copies/mL. A negative result does not preclude SARS-Cov-2 infection and should not be used as the sole basis for treatment or other patient management decisions. A negative result may occur with  improper specimen collection/handling,  submission of specimen other than nasopharyngeal swab, presence of viral mutation(s) within the areas targeted by this assay, and inadequate number of viral copies(<138 copies/mL). A negative result must be combined with clinical observations, patient history, and epidemiological information. The expected result is Negative.  Fact Sheet for Patients:  EntrepreneurPulse.com.au  Fact Sheet for Healthcare Providers:  IncredibleEmployment.be  This test is no t yet approved or cleared by the [place] FDA and  has been authorized for detection and/or diagnosis of SARS-CoV-2 by FDA under an Emergency Use Authorization (EUA). This EUA will remain  in effect (meaning this test can be used) for the duration of the COVID-19 declaration under Section 564(b)(1) of the Act, 21 U.S.C.section 360bbb-3(b)(1), unless the authorization is terminated  or revoked sooner.       Influenza A by PCR NEGATIVE NEGATIVE Final   Influenza B by PCR NEGATIVE NEGATIVE Final    Comment: (NOTE) The Xpert Xpress SARS-CoV-2/FLU/RSV plus assay is intended as an aid in the diagnosis of influenza from Nasopharyngeal swab specimens and should not be used as a sole basis for treatment. Nasal washings and aspirates are unacceptable for Xpert Xpress SARS-CoV-2/FLU/RSV testing.  Fact Sheet for Patients: EntrepreneurPulse.com.au  Fact Sheet for Healthcare Providers: IncredibleEmployment.be  This test is not yet approved or cleared by the [place] FDA and has been authorized for detection and/or diagnosis of SARS-CoV-2 by FDA under an Emergency Use Authorization (EUA). This EUA will remain in effect (meaning this test can be used) for the duration of the COVID-19 declaration under Section 564(b)(1) of the Act, 21 U.S.C. section 360bbb-3(b)(1), unless the authorization is terminated or revoked.  Performed at [facility], [place] [address] [place], [place] [address]         Radiology Studies: No results found.      Scheduled Meds:  amLODipine  10 mg Oral Daily   fluticasone  1 puff Inhalation BID   folic acid  1 mg Oral Daily   lamoTRIgine  25 mg Oral Daily   loratadine  10 mg Oral Daily   melatonin  5 mg Oral QHS   mirtazapine  15 mg Oral QHS   multivitamin with minerals  1 tablet Oral Daily   mupirocin ointment   Nasal BID   pantoprazole  40 mg Oral q morning   predniSONE  40 mg Oral Daily   risperiDONE  2 mg Oral QHS   simvastatin  40 mg Oral Q supper   thiamine  100 mg Oral Daily   vitamin B-12  1,000 mcg Oral Daily   Vitamin D (Ergocalciferol)  50,000 Units Oral Q7 days   Continuous Infusions:   LOS: 2 days     Cordelia Poche, MD [facility] 09/20/2021, 3:18 PM  If 7PM-7AM, please contact night-coverage [email or web address]

## 2021-09-20 NOTE — BHH Group Notes (Signed)
PsychoEducational group completed. Patients were asked to read ''There's a hole in my sidewalk by Mabeline Caras'' and identify negative behavioral patterns in which that has affected their mental health. Pt identified her addiction as a negative pattern.

## 2021-09-20 NOTE — Progress Notes (Signed)
Patient ID: Denise Knight, female   DOB: [birth year], 55 y.o.   MRN: [id number] Patient has an appointment with Radiology at 1630 for a chest x-ray. Clinical research associate for transportation.

## 2021-09-21 DIAGNOSIS — J45901 Unspecified asthma with (acute) exacerbation: Secondary | ICD-10-CM

## 2021-09-21 NOTE — Plan of Care (Signed)
  Problem: Education: Goal: Emotional status will improve Outcome: Progressing Goal: Mental status will improve Outcome: Progressing Goal: Verbalization of understanding the information provided will improve Outcome: Progressing   Problem: Activity: Goal: Interest or engagement in activities will improve Outcome: Progressing   Problem: Coping: Goal: Ability to verbalize frustrations and anger appropriately will improve Outcome: Progressing Goal: Ability to demonstrate self-control will improve Outcome: Progressing   Problem: Safety: Goal: Periods of time without injury will increase Outcome: Progressing

## 2021-09-21 NOTE — Group Note (Signed)
Recreation Therapy Group Note   Group Topic:Stress Management  Group Date: 09/21/2021 Start Time: 0930 End Time: 0950 Facilitators: Victorino Sparrow, LRT/CTRS Location: [address] Dayroom  Goal Area(s) Addresses:  Patient will actively participate in stress management techniques presented during session.  Patient will successfully identify benefit of practicing stress management post d/c.   Group Description: Guided Imagery. LRT provided education, instruction, and demonstration on practice of visualization via guided imagery. Patient was asked to participate in the technique introduced during session. LRT debriefed including topics of mindfulness, stress management and specific scenarios each patient could use these techniques. Patients were given suggestions of ways to access scripts post d/c and encouraged to explore Youtube and other apps available on smartphones, tablets, and computers.   Affect/Mood: Appropriate   Participation Level: Engaged   Participation Quality: Independent   Behavior: Appropriate   Speech/Thought Process: Focused   Insight: Good   Judgement: Good   Modes of Intervention: Ambient Music, Script   Patient Response to Interventions:  Engaged   Education Outcome:  Acknowledges education and In group clarification offered    Clinical Observations/Individualized Feedback: Pt attended and participated in group.    Plan: Continue to engage patient in RT group sessions 2-3x/week.   Victorino Sparrow, LRT/CTRS  09/21/2021 10:55 AM

## 2021-09-21 NOTE — BHH Group Notes (Signed)
PsychoEducational Group- Self Care Toolkit and Sleep Hygiene were discussed in group. The patients were asked to identify healthy and unhealthy behaviors as it relates to sleep hygiene. Patients were also asked to identify simple was to improve self care, identifying a gratitude list, establishing healthy boundaries and recognizing the impact of unhealthy boundaries as it impacts mental health. Pt participated and was appropriate.

## 2021-09-21 NOTE — B[facility] Group Notes (Signed)
Did not attend grp

## 2021-09-21 NOTE — Progress Notes (Signed)
Patient pleasant and cooperative. Denies any SI, HI, AVH. Reports anxiety and insomnia. Reports not sleeping well at nights. Patient given prns to assist. Patient visible in milieu, interacting appropriately with staff and peers. Request MOM, for constipation. Awaiting effectiveness.  Encouragement and support provided. Safety checks maintained. Medications given as prescribed.  Pt receptive and remains safe on unit with q 15 min checks.

## 2021-09-21 NOTE — Progress Notes (Signed)
CSW met with patient who reported that she had called sober for [place] and was accepted.  There is a bed in [facility] area but they only hold bed for 48 hours. CSW encouraged patient to call contact two days prior to discharge date. Patient agreed.    Yosgar Demirjian, LCSW, [place] Social Worker  [facility]

## 2021-09-21 NOTE — B[facility] Group Notes (Signed)
Patient attended relaxation group. Patient was actively engaged and appropriate throughout.

## 2021-09-21 NOTE — Progress Notes (Signed)
   09/21/21 1142  Vital Signs  Pulse Rate 93  BP 115/82  BP Method Automatic   D: Patient denies SI/HI/AVH. Patient rated both anxiety and depression 8/10.  Pt. Out in open areas and was social with staff and peers.  A:  Patient took scheduled medicine.  Support and encouragement provided Routine safety checks conducted every 15 minutes. Patient  Informed to notify staff with any concerns.   R:  Safety maintained.

## 2021-09-21 NOTE — Group Note (Signed)
LCSW Group Therapy Note   Group Date: 09/21/2021 Start Time: 1300 End Time: 1400   Type of Therapy and Topic:  Group Therapy: Coping Skills   Participation Level:  Active    Due to the acuity and complex discharge plans, group was not held. Patient was provided therapeutic worksheets and asked to meet with CSW as needed.  Darleen Crocker, LCSWA 09/21/2021  2:22 PM

## 2021-09-21 NOTE — Progress Notes (Addendum)
PROGRESS NOTE    Denise Knight  ZWC:[id number] DOB: 09-16-1966 DOA: 09/18/2021 PCP: Medicine, [facility]   Brief Narrative: Denise Knight is a 55 y.o. female with a history of major depressive disorder, bipolar disorder, PTSD, asthma. Patient presented to [facility] after suicide attempt. Medicine consulted for cough and concern for asthma exacerbation.   Assessment & Plan:   Asthma exacerbation, mild Patient with a history of asthma which can be exacerbated by seasons per patient report. She uses an albuterol inhaler and nebulizer as an outpatient. No chest pain or hypoxia. Dyspnea has now improved with continued treatment. Chest x-ray unremarkable. -Continue prednisone for a total 5 day burst -Continue albuterol prn, flovent  On Discharge: -Recommend to send prescriptions for albuterol HFA and albuterol nebulizer solution; patient is unsure what pharmacy to send at the moment so I did not order -Can continue Flovent and switch to home budesonide nebulizer once discharged back home; she should not use both Flovent and budesonide nebulizer. Since she will be going to another facility, she can continue Flovent on discharge with instructions to switch to budesonide once she has returned home  Primary hypertension -Continue home amlodipine  [facility] will sign off at this time.  DVT prophylaxis: Per primary Code Status:   Code Status: Full Code Family Communication: None Disposition Plan: Per primary   Procedures:  None  Antimicrobials: None    Subjective: Cough and dyspnea is improved.  Objective: Vitals:   09/20/21 2059 09/21/21 0620 09/21/21 0621 09/21/21 1142  BP: (!) 138/95 (!) 129/98 (!) 121/93 115/82  Pulse: (!) 106 98 100 93  Resp:      Temp: 98.2 F (36.8 C) 98 F (36.7 C)    TempSrc: Oral Oral    SpO2:  99% 100%   Weight:      Height:       No intake or output data in the 24 hours ending 09/21/21 1523 Filed Weights   09/18/21 2115   Weight: 88.5 kg    Examination:  General exam: Appears calm and comfortable  Respiratory system: Clear to auscultation. Respiratory effort normal. Cardiovascular system: S1 & S2 heard, RRR. 1/6 systolic murmur Gastrointestinal system: Abdomen is nondistended, soft and nontender. No organomegaly or masses felt. Normal bowel sounds heard. Central nervous system: Alert and oriented. No focal neurological deficits. Musculoskeletal: No edema. No calf tenderness Skin: No cyanosis. No rashes Psychiatry: Judgement and insight appear normal. Mood & affect appropriate.     Data Reviewed: I have personally reviewed following labs and imaging studies  CBC Lab Results  Component Value Date   WBC 8.3 09/19/2021   RBC 4.23 09/19/2021   HGB 11.4 (L) 09/19/2021   HCT 36.8 09/19/2021   MCV 87.0 09/19/2021   MCH 27.0 09/19/2021   PLT 320 09/19/2021   MCHC 31.0 09/19/2021   RDW 17.6 (H) 09/19/2021   LYMPHSABS 2.4 09/19/2021   MONOABS 0.6 09/19/2021   EOSABS 0.6 (H) 09/19/2021   BASOSABS 0.1 23/53/6144     Last metabolic panel Lab Results  Component Value Date   NA 137 09/19/2021   K 4.2 09/19/2021   CL 105 09/19/2021   CO2 26 09/19/2021   BUN 12 09/19/2021   CREATININE 1.10 (H) 09/19/2021   GLUCOSE 101 (H) 09/19/2021   GFRNONAA 59 (L) 09/19/2021   GFRAA >60 07/28/2020   CALCIUM 9.4 09/19/2021   PHOS 2.9 09/17/2021   PROT 7.5 09/19/2021   ALBUMIN 3.9 09/19/2021   BILITOT 0.4 09/19/2021  ALKPHOS 66 09/19/2021   AST 17 09/19/2021   ALT 36 09/19/2021   ANIONGAP 6 09/19/2021    CBG (last 3)  No results for input(s): GLUCAP in the last 72 hours.    GFR: Estimated Creatinine Clearance: 66.4 mL/min (A) (by C-G formula based on SCr of 1.1 mg/dL (H)).  Coagulation Profile: Recent Labs  Lab 09/16/21 2051 09/17/21 0322 09/17/21 1314  INR 1.0 1.1 1.1     Recent Results (from the past 240 hour(s))  Resp Panel by RT-PCR (Flu A&B, Covid) Nasopharyngeal Swab     Status:  None   Collection Time: 09/16/21 10:45 AM   Specimen: Nasopharyngeal Swab; Nasopharyngeal(NP) swabs in vial transport medium  Result Value Ref Range Status   SARS Coronavirus 2 by RT PCR NEGATIVE NEGATIVE Final    Comment: (NOTE) SARS-CoV-2 target nucleic acids are NOT DETECTED.  The SARS-CoV-2 RNA is generally detectable in upper respiratory specimens during the acute phase of infection. The lowest concentration of SARS-CoV-2 viral copies this assay can detect is 138 copies/mL. A negative result does not preclude SARS-Cov-2 infection and should not be used as the sole basis for treatment or other patient management decisions. A negative result may occur with  improper specimen collection/handling, submission of specimen other than nasopharyngeal swab, presence of viral mutation(s) within the areas targeted by this assay, and inadequate number of viral copies(<138 copies/mL). A negative result must be combined with clinical observations, patient history, and epidemiological information. The expected result is Negative.  Fact Sheet for Patients:  EntrepreneurPulse.com.au  Fact Sheet for Healthcare Providers:  IncredibleEmployment.be  This test is no t yet approved or cleared by the [place] FDA and  has been authorized for detection and/or diagnosis of SARS-CoV-2 by FDA under an Emergency Use Authorization (EUA). This EUA will remain  in effect (meaning this test can be used) for the duration of the COVID-19 declaration under Section 564(b)(1) of the Act, 21 U.S.C.section 360bbb-3(b)(1), unless the authorization is terminated  or revoked sooner.       Influenza A by PCR NEGATIVE NEGATIVE Final   Influenza B by PCR NEGATIVE NEGATIVE Final    Comment: (NOTE) The Xpert Xpress SARS-CoV-2/FLU/RSV plus assay is intended as an aid in the diagnosis of influenza from Nasopharyngeal swab specimens and should not be used as a sole basis for  treatment. Nasal washings and aspirates are unacceptable for Xpert Xpress SARS-CoV-2/FLU/RSV testing.  Fact Sheet for Patients: EntrepreneurPulse.com.au  Fact Sheet for Healthcare Providers: IncredibleEmployment.be  This test is not yet approved or cleared by the [place] FDA and has been authorized for detection and/or diagnosis of SARS-CoV-2 by FDA under an Emergency Use Authorization (EUA). This EUA will remain in effect (meaning this test can be used) for the duration of the COVID-19 declaration under Section 564(b)(1) of the Act, 21 U.S.C. section 360bbb-3(b)(1), unless the authorization is terminated or revoked.  Performed at [facility], [place] [address], [place], [place] [address]   MRSA Next Gen by PCR, Nasal     Status: Abnormal   Collection Time: 09/17/21 12:15 AM   Specimen: Nasal Mucosa; Nasal Swab  Result Value Ref Range Status   MRSA by PCR Next Gen DETECTED (A) NOT DETECTED Final    Comment: RESULT CALLED TO, READ BACK BY AND VERIFIED WITH: GRAHAM,A RN @0250  ON 09/17/21 JACKSON,K (NOTE) The GeneXpert MRSA Assay (FDA approved for NASAL specimens only), is one component of a comprehensive MRSA colonization surveillance program. It is not intended to  diagnose MRSA infection nor to guide or monitor treatment for MRSA infections. Test performance is not FDA approved in patients less than 86 years old. Performed at [facility], [place] [address] [place], [place] [address]   Resp Panel by RT-PCR (Flu A&B, Covid) Nasopharyngeal Swab     Status: None   Collection Time: 09/18/21 12:54 PM   Specimen: Nasopharyngeal Swab; Nasopharyngeal(NP) swabs in vial transport medium  Result Value Ref Range Status   SARS Coronavirus 2 by RT PCR NEGATIVE NEGATIVE Final    Comment: (NOTE) SARS-CoV-2 target nucleic acids are NOT DETECTED.  The SARS-CoV-2 RNA is generally detectable in upper  respiratory specimens during the acute phase of infection. The lowest concentration of SARS-CoV-2 viral copies this assay can detect is 138 copies/mL. A negative result does not preclude SARS-Cov-2 infection and should not be used as the sole basis for treatment or other patient management decisions. A negative result may occur with  improper specimen collection/handling, submission of specimen other than nasopharyngeal swab, presence of viral mutation(s) within the areas targeted by this assay, and inadequate number of viral copies(<138 copies/mL). A negative result must be combined with clinical observations, patient history, and epidemiological information. The expected result is Negative.  Fact Sheet for Patients:  EntrepreneurPulse.com.au  Fact Sheet for Healthcare Providers:  IncredibleEmployment.be  This test is no t yet approved or cleared by the [place] FDA and  has been authorized for detection and/or diagnosis of SARS-CoV-2 by FDA under an Emergency Use Authorization (EUA). This EUA will remain  in effect (meaning this test can be used) for the duration of the COVID-19 declaration under Section 564(b)(1) of the Act, 21 U.S.C.section 360bbb-3(b)(1), unless the authorization is terminated  or revoked sooner.       Influenza A by PCR NEGATIVE NEGATIVE Final   Influenza B by PCR NEGATIVE NEGATIVE Final    Comment: (NOTE) The Xpert Xpress SARS-CoV-2/FLU/RSV plus assay is intended as an aid in the diagnosis of influenza from Nasopharyngeal swab specimens and should not be used as a sole basis for treatment. Nasal washings and aspirates are unacceptable for Xpert Xpress SARS-CoV-2/FLU/RSV testing.  Fact Sheet for Patients: EntrepreneurPulse.com.au  Fact Sheet for Healthcare Providers: IncredibleEmployment.be  This test is not yet approved or cleared by the [place] FDA and has been  authorized for detection and/or diagnosis of SARS-CoV-2 by FDA under an Emergency Use Authorization (EUA). This EUA will remain in effect (meaning this test can be used) for the duration of the COVID-19 declaration under Section 564(b)(1) of the Act, 21 U.S.C. section 360bbb-3(b)(1), unless the authorization is terminated or revoked.  Performed at [facility], [place] [address], [place], [place] [address]          Radiology Studies: DG Chest 2 View  Result Date: 09/20/2021 CLINICAL DATA:  Cough with short of breath EXAM: CHEST - 2 VIEW COMPARISON:  09/02/2021 FINDINGS: The heart size and mediastinal contours are within normal limits. Both lungs are clear. The visualized skeletal structures are unremarkable. Linear scarring or atelectasis in the left mid lung. IMPRESSION: No active cardiopulmonary disease. Electronically Signed   By: Donavan Foil M.D.   On: 09/20/2021 19:17        Scheduled Meds:  amLODipine  10 mg Oral Daily   fluticasone  1 puff Inhalation BID   folic acid  1 mg Oral Daily   lamoTRIgine  25 mg Oral Daily   loratadine  10 mg Oral Daily   melatonin  5 mg Oral QHS   mirtazapine  15 mg Oral QHS   multivitamin with minerals  1 tablet Oral Daily   mupirocin ointment   Nasal BID   pantoprazole  40 mg Oral q morning   predniSONE  40 mg Oral Daily   QUEtiapine  100 mg Oral QHS   simvastatin  40 mg Oral Q supper   thiamine  100 mg Oral Daily   vitamin B-12  1,000 mcg Oral Daily   Vitamin D (Ergocalciferol)  50,000 Units Oral Q7 days   Continuous Infusions:   LOS: 3 days     Cordelia Poche, MD [facility] 09/21/2021, 3:23 PM  If 7PM-7AM, please contact night-coverage [email or web address]

## 2021-09-21 NOTE — Progress Notes (Addendum)
[facility] Progress Note  09/21/2021 3:20 PM Denise Knight  MRN:  [id number]  Subjective: Denise Knight reports, "I have been back in the hospital since Wednesday of this week. I got discharged from here not too long ago. When I left the hospital, I had a little bit money with me, that was the trigger. I slipped & used crack cocaine. I got discouraged afterwards & tried to commit suicide by taking an overdose on a bottle tylenol PM. The Tylenol pm did not kill me, but damaged my kidney.  The only complain I have today is low energy. I'm still feeling depressed. My depression is #8 today". Daily notes: Denise Knight is seen in her room. Chart reviewed. The chart findings dicussed with the treatment team. She is lying down in bed. She presents alert, oriented & aware of situation. She has been visible on unit, attending group sessions. She says she has low energy today & still feeling depressed. She rates her depression #8 today. However, Mellanie seem excited when talking about finding a sober living house in the [place], [place] area. She says she will need to call this sober house on Sunday, informed them that she will not be able to get there until Next Tuesday. She says all her clothing & other belongings are currently in a storage unit. She says she will have to go to the storage unit after discharge to pick-up some clothes prior to going to her new sober living residence. She is encouraged to tell the sober house that she will be getting there by the evening time. She currently denies any SIHI, AVH, delusional thoughts or paranoia. She does not appear to be responding to any internal stimuli. Vital signs reviewed, stable. Patient's creatinine remains elevated (cr 1.10). Will recheck the CMP on Sunday 08-23-21.   Per admission evaluation notes: [name] is a 55 year old female with reported PPHx of depression, bipolar, PTSD who was admitted to [facility] from [place] [facility] due to suicide attempt with 32  pills of Tylenol PM. Patient has had multiple psychiatric admissions.  Patient last noted admission was September 02, 2021, and she was discharged last week. At the time of discharge, patient was much improved from admission feeling happy with an optimistic attitude and no reported suicidal ideation. She was denied from inpatient treatment programs but had planned to go to [facility] to be with her cousin  (Per previous notes): Patient said she is doing a little better today. Patient is walking today instead of maneuvering in wheelchair after being cleared by physical therapy yesterday afternoon. She is currently limping, and she says this is because of the arthritis in her right knee, which has been aggravated by the weather change. She believes she needs Voltaren gel and was advised that the Prednisone she is taking for her asthma will likely help her joint pain. She is concerned that she was told she has MRSA. She was reassured that this is likely asymptomatic colonization and that we are treating with bactroban nasal ointment.  She denies SI/HI/AVH, ideas of reference, first ranks symptoms or delusions. Patient said she is experiencing no withdrawal symptoms or current cravings. She said she did not sleep well at all last night. She is willing to change antipsychotics if this will help her sleep better and states she previously did well on Seroquel. She said her appetite is better this morning. She did not really want breakfast, lunch, or dinner yesterday, but ate well this morning.  She is still coughing,  but she says it is not that often. She said she tries to cough something up but cannot because the congestion is very deep in her chest. She denied headache, chest pain, trouble breathing, nausea, and constipation, and she said she has had a bowel movement.    After discussing limited rehab options with her current insurance, she is open to option of [facility] or an [employer] referral with  [employer]. The patient said the further she is able to get from [facility] the better to avoid being around influences and people who use. She is optimistic but knows that if she stays in this same environment after discharge, she will return to doing drugs again. She says she will remain compliant with her medications and is in agreement to continue with the treatment plan.   Principal Problem: Bipolar II disorder, most recent episode major depressive ([place])  Diagnosis: Principal Problem:   Bipolar II disorder, most recent episode major depressive ([place]) Active Problems:   Cocaine abuse ([place])   Primary hypertension   Cough  Total Time Spent in Direct Patient Care:  I personally spent 30 minutes on the unit in direct patient care. The direct patient care time included face-to-face time with the patient, reviewing the patient's chart, communicating with other professionals, and coordinating care. Greater than 50% of this time was spent in counseling or coordinating care with the patient regarding goals of hospitalization, psycho-education, and discharge planning needs.  Past Psychiatric History: See H&P  Past Medical History:  Past Medical History:  Diagnosis Date   Allergy    seasonal   Anxiety    Arthritis    "left knee" (07/05/2016)   Asthma    Bipolar 1 disorder ([place])    Depression    Diabetes mellitus without complication (HCC)    pre-   GERD (gastroesophageal reflux disease)    Hypertension    MI (mitral incompetence)    Post traumatic stress disorder    Schizophrenia ([place])    Stomach ulcer    Substance abuse ([place])    cocaine quit july 2020   Suicide attempt [facility])     Past Surgical History:  Procedure Laterality Date   CARDIAC CATHETERIZATION  07/05/2016   CARDIAC CATHETERIZATION N/A 07/05/2016   Procedure: Left Heart Cath and Coronary Angiography;  Surgeon: Adrian Prows, MD;  Location: [place] [facility];  Service: Cardiovascular;  Laterality: N/A;   CYST EXCISION Right     "wrist"   DILATION AND CURETTAGE OF UTERUS     FOOT SURGERY Bilateral    "corns removed"   LEFT HEART CATH AND CORONARY ANGIOGRAPHY N/A 12/24/2017   Procedure: LEFT HEART CATH AND CORONARY ANGIOGRAPHY;  Surgeon: Nigel Mormon, MD;  Location: [place] [facility];  Service: Cardiovascular;  Laterality: N/A;   TUBAL LIGATION     Family History:  Family History  Problem Relation Age of Onset   Breast cancer Other    Lung cancer Other    Congestive Heart Failure Other    Diabetes Other    Hypertension Other    Colon cancer Neg Hx    Colon polyps Neg Hx    Esophageal cancer Neg Hx    Stomach cancer Neg Hx    Rectal cancer Neg Hx    Family Psychiatric  History: See H&P Social History:  Social History   Substance and Sexual Activity  Alcohol Use Yes   Alcohol/week: 7.0 standard drinks   Types: 7 Cans of beer per week  Social History   Substance and Sexual Activity  Drug Use Yes   Types: Marijuana    Social History   Socioeconomic History   Marital status: Widowed    Spouse name: Not on file   Number of children: 2   Years of education: Not on file   Highest education level: Not on file  Occupational History   Occupation: disabled   Tobacco Use   Smoking status: Some Days    Packs/day: 0.25    Years: 35.00    Pack years: 8.75    Types: Cigarettes    Last attempt to quit: 05/30/2019    Years since quitting: 2.3   Smokeless tobacco: Never  Vaping Use   Vaping Use: Never used  Substance and Sexual Activity   Alcohol use: Yes    Alcohol/week: 7.0 standard drinks    Types: 7 Cans of beer per week   Drug use: Yes    Types: Marijuana   Sexual activity: Yes    Birth control/protection: Surgical  Other Topics Concern   Not on file  Social History Narrative   Not on file   Social Determinants of Health   Financial Resource Strain: Not on file  Food Insecurity: Not on file  Transportation Needs: Not on file  Physical Activity: Not on file  Stress: Not  on file  Social Connections: Not on file   Sleep: Fair (5.0).  Appetite:  Good, improved from yesterday when she had poor appetite  Current Medications: Current Facility-Administered Medications  Medication Dose Route Frequency Provider Last Rate Last Admin   albuterol (PROVENTIL) (2.5 MG/3ML) 0.083% nebulizer solution 2.5 mg  2.5 mg Inhalation Q6H PRN Suella Broad, FNP   2.5 mg at 09/19/21 0934   albuterol (VENTOLIN HFA) 108 (90 Base) MCG/ACT inhaler 1-2 puff  1-2 puff Inhalation Q6H PRN Nelda Marseille, Sidonie Dexheimer E, MD       alum & mag hydroxide-simeth (MAALOX/MYLANTA) 200-200-20 MG/5ML suspension 30 mL  30 mL Oral Q4H PRN Starkes-Perry, Gayland Curry, FNP       amLODipine (NORVASC) tablet 10 mg  10 mg Oral Daily Suella Broad, FNP   10 mg at 09/21/21 0958   fluticasone (FLOVENT HFA) 110 MCG/ACT inhaler 1 puff  1 puff Inhalation BID Harlow Asa, MD   1 puff at 82/50/03 7048   folic acid (FOLVITE) tablet 1 mg  1 mg Oral Daily Doda, Vandana, MD   1 mg at 09/21/21 0958   guaiFENesin (ROBITUSSIN) 100 MG/5ML liquid 10 mL  10 mL Oral Q4H PRN Viann Fish E, MD   10 mL at 09/20/21 1835   hydrOXYzine (ATARAX/VISTARIL) tablet 25 mg  25 mg Oral Q6H PRN Viann Fish E, MD   25 mg at 09/21/21 1004   lamoTRIgine (LAMICTAL) tablet 25 mg  25 mg Oral Daily Suella Broad, FNP   25 mg at 09/21/21 1004   loperamide (IMODIUM) capsule 2-4 mg  2-4 mg Oral PRN Harlow Asa, MD       loratadine (CLARITIN) tablet 10 mg  10 mg Oral Daily Kyle, Tyrone A, DO   10 mg at 09/21/21 1004   LORazepam (ATIVAN) tablet 1 mg  1 mg Oral Q6H PRN Nelda Marseille, Maxim Bedel E, MD       magnesium hydroxide (MILK OF MAGNESIA) suspension 30 mL  30 mL Oral Daily PRN Suella Broad, FNP   30 mL at 09/20/21 1835   melatonin tablet 5 mg  5 mg Oral QHS Dolora Ridgely E,  MD   5 mg at 09/20/21 2057   mirtazapine (REMERON) tablet 15 mg  15 mg Oral QHS Suella Broad, FNP   15 mg at 09/20/21 2055   multivitamin with  minerals tablet 1 tablet  1 tablet Oral Daily Harlow Asa, MD   1 tablet at 09/21/21 0959   mupirocin ointment (BACTROBAN) 2 %   Nasal BID Harlow Asa, MD   Given at 09/21/21 1001   nicotine polacrilex (NICORETTE) gum 2 mg  2 mg Oral PRN Harlow Asa, MD       ondansetron (ZOFRAN-ODT) disintegrating tablet 4 mg  4 mg Oral Q6H PRN Harlow Asa, MD       pantoprazole (PROTONIX) EC tablet 40 mg  40 mg Oral q morning Suella Broad, FNP   40 mg at 09/21/21 1004   predniSONE (DELTASONE) tablet 40 mg  40 mg Oral Daily Kyle, Tyrone A, DO   40 mg at 09/21/21 1000   QUEtiapine (SEROQUEL) tablet 100 mg  100 mg Oral QHS Nelda Marseille, Mizuki Hoel E, MD   100 mg at 09/20/21 2055   simvastatin (ZOCOR) tablet 40 mg  40 mg Oral Q supper Suella Broad, FNP   40 mg at 09/20/21 1701   sodium chloride (OCEAN) 0.65 % nasal spray 1 spray  1 spray Each Nare PRN Suella Broad, FNP       thiamine tablet 100 mg  100 mg Oral Daily Nelda Marseille, Arriana Lohmann E, MD   100 mg at 09/21/21 0086   vitamin B-12 (CYANOCOBALAMIN) tablet 1,000 mcg  1,000 mcg Oral Daily Suella Broad, FNP   1,000 mcg at 09/21/21 1000   Vitamin D (Ergocalciferol) (DRISDOL) capsule 50,000 Units  50,000 Units Oral Q7 days Harlow Asa, MD   50,000 Units at 09/19/21 1824   Lab Results:  Results for orders placed or performed during the hospital encounter of 09/18/21 (from the past 48 hour(s))  CBC with Differential/Platelet     Status: Abnormal   Collection Time: 09/19/21  6:29 PM  Result Value Ref Range   WBC 8.3 4.0 - 10.5 K/uL   RBC 4.23 3.87 - 5.11 MIL/uL   Hemoglobin 11.4 (L) 12.0 - 15.0 g/dL   HCT 36.8 36.0 - 46.0 %   MCV 87.0 80.0 - 100.0 fL   MCH 27.0 26.0 - 34.0 pg   MCHC 31.0 30.0 - 36.0 g/dL   RDW 17.6 (H) 11.5 - 15.5 %   Platelets 320 150 - 400 K/uL   nRBC 0.0 0.0 - 0.2 %   Neutrophils Relative % 56 %   Neutro Abs 4.7 1.7 - 7.7 K/uL   Lymphocytes Relative 29 %   Lymphs Abs 2.4 0.7 - 4.0 K/uL    Monocytes Relative 7 %   Monocytes Absolute 0.6 0.1 - 1.0 K/uL   Eosinophils Relative 7 %   Eosinophils Absolute 0.6 (H) 0.0 - 0.5 K/uL   Basophils Relative 1 %   Basophils Absolute 0.1 0.0 - 0.1 K/uL   Immature Granulocytes 0 %   Abs Immature Granulocytes 0.03 0.00 - 0.07 K/uL    Comment: Performed at [facility], [place] [address], [place], [place] [address]  Comprehensive metabolic panel     Status: Abnormal   Collection Time: 09/19/21  6:29 PM  Result Value Ref Range   Sodium 137 135 - 145 mmol/L   Potassium 4.2 3.5 - 5.1 mmol/L   Chloride 105 98 - 111 mmol/L   CO2  26 22 - 32 mmol/L   Glucose, Bld 101 (H) 70 - 99 mg/dL    Comment: Glucose reference range applies only to samples taken after fasting for at least 8 hours.   BUN 12 6 - 20 mg/dL   Creatinine, Ser 1.10 (H) 0.44 - 1.00 mg/dL   Calcium 9.4 8.9 - 10.3 mg/dL   Total Protein 7.5 6.5 - 8.1 g/dL   Albumin 3.9 3.5 - 5.0 g/dL   AST 17 15 - 41 U/L   ALT 36 0 - 44 U/L   Alkaline Phosphatase 66 38 - 126 U/L   Total Bilirubin 0.4 0.3 - 1.2 mg/dL   GFR, Estimated 59 (L) >60 mL/min    Comment: (NOTE) Calculated using the CKD-EPI Creatinine Equation (2021)    Anion gap 6 5 - 15    Comment: Performed at [facility], [place] [address] [place], [place] [address]   Blood Alcohol level:  Lab Results  Component Value Date   [place] <10 09/16/2021   ETH <10 64/40/3474   Metabolic Disorder Labs: Lab Results  Component Value Date   HGBA1C 5.7 (H) 09/03/2021   MPG 116.89 09/03/2021   MPG 116.89 01/13/2020   No results found for: PROLACTIN Lab Results  Component Value Date   CHOL 230 (H) 09/03/2021   TRIG 111 09/03/2021   HDL 62 09/03/2021   CHOLHDL 3.7 09/03/2021   VLDL 22 09/03/2021   LDLCALC 146 (H) 09/03/2021   LDLCALC 114 (H) 01/13/2020    Physical Findings: CIWA:  CIWA-Ar Total: 0  Musculoskeletal: Strength & Muscle Tone: within normal limits Gait & Station:  Walking with a  limp Patient leans:  Did not notice any leaning  Psychiatric Specialty Exam:  Presentation  General Appearance: Casual, dress appropriate for environment  Eye Contact:Fair  Speech:Clear and Coherent; Slow  Speech Volume:Decreased  Handedness:Right  Mood and Affect  Mood:Anxious; Depressed  Affect:Depressed; Flat, Mood-congruent  Thought Process  Thought Processes:Linear; Coherent  Descriptions of Associations:Intact  Orientation:Full (Time, Place and Person)  Thought Content:Logical - denies AVH, paranoia, or delusions   History of Schizophrenia/Schizoaffective disorder:No  Hallucinations:Denied Ideas of Reference:Denied  Suicidal Thoughts:Denied Homicidal Thoughts:Denied  Sensorium  Memory:Immediate Fair; Recent Fair; Remote Fair  Judgment:Fair  Insight:Fair  Executive Functions  Concentration:Fair  Attention Span:Fair  [place]  Psychomotor Activity  Psychomotor Activity:No agitation or retardation  Assets  Assets:Communication Skills; Desire for Improvement; Financial Resources/Insurance; Housing; Physical Health; Leisure Time  Sleep  Sleep: 5.0 hours  Physical Exam Vitals reviewed.  Constitutional:      General: She is not in acute distress. HENT:     Head: Normocephalic and atraumatic.  Pulmonary:     Effort: Pulmonary effort is normal.  Neurological:     Mental Status: She is alert and oriented to person, place, and time.   Review of Systems  Constitutional:  Positive for malaise/fatigue. Negative for chills, diaphoresis, fever and weight loss.  HENT:  Negative for congestion.   Respiratory:  Negative for cough and shortness of breath.   Cardiovascular:  Negative for chest pain and leg swelling.  Gastrointestinal:  Negative for constipation, diarrhea, nausea and vomiting.  Genitourinary:  Negative for dysuria.  Musculoskeletal:  Negative for joint pain.       Positive for right knee  pain due to arthritis.  All other systems reviewed and are negative.  Blood pressure 115/82, pulse 93, temperature 98 F (36.7 C), temperature source Oral, resp. rate 18, height  5' 8"  (1.727 m), weight 88.5 kg, SpO2 100 %. Body mass index is 29.67 kg/m.  Treatment Plan Summary: Daily contact with patient to assess and evaluate symptoms and progress in treatment and Medication management   ASSESSMENT Active Problems:   Bipolar II MRE depressed severe without current psychotic features   PTSD by hx   Stimulant use d/o - cocaine type   Alcohol use d/o by hx   Cannabis use d/o in early remission   Vitamin D deficiency   Hyperlipidemia   Asthma   HTN   GERD   [name] is a 55 year-old female with reported PPHx of depression, bipolar, PTSD who was admitted to [facility] from [place] ED due to suicide attempt with 32 pills of Tylenol PM. Patient was restarted on several medications, and she desires improvement in her mental state and sleep. Desires rehabilitation following discharge.   Labs reviewed:  CBC (11/9) - hemoglobin 11.4 CMP (11/9) - creatinine 1.10 (re-check Sunday, 08-23-21).  PLAN Psychiatric Problems Bipolar II MRE depressed severe without current psychotic features PTSD by hx R/o substance induced mood d/o - ECG 11/8 was normal, QTC 419m - Continue Seroquel 1021mqhs to improve sleep and help with mood stabilization - Discontinuing Risperdal 100m68mhs - Continue Remeron 24m87mily at bedtime. - Continue Lamictal 25mg90mly - Continue melatonin 5mg d10my at bedtime. - Stopped amantadine, which had been added for help with cocaine cravings to avoid return of psychosis as noted during previous inpatient admission.   Stimulant use d/o - cocaine type  Alcohol use d/o by hx  Cannabis use d/o in early remission - CIWA with Ativan as needed for CIWA greater than 10 (most recent CIWA 2) - Continue thiamine 100mg d74m. - Continue multivitamin with minerals  daily. - Continue folic acid 1mg dai71m - Continue Zofran 4mg q6 P18mfor nausea or vomiting. - Continue Imodium 2-4mg PRN f60mdiarrhea or loose stools for 72 hours. - Patient discussing [place] [facility] for discharge    Medical Problems  Asthma Cough/SOB - Medicine was consulted and continue the following recs:       - Continue Robitussin 100mg/5mL s38m q4 PRN      - Continue albuterol MDI and nebs and  Flovent       - Continue steroid burst with prednisone 40mg daily 37m5 days (11/11 Day 3) - monitoring for psychosis or mood escalation on steroid      - Continue loratadine 10mg daily  60m- Chest x-ray negative  HTN - Last blood pressure: 112/71 - Continue Norvasc 10mg - CTM wi38maily vitals   Hyperlipidemia - Continue simvastatin 40mg    GERD -88mtinue Protonix 40mg   Vitamin 77mciencies - Continue Vitamin B12 and Vitamin D    Weakness Physical therapy evaluated yesterday, patient given permission to ambulate. Will CTM.   Nicotine dependence Nicotine gum as needed for smoking cessation   MRSA positive - Finish bactroban nasal BID X 7 days per pharmacist recommendation  Recent AKI s/p overdose - Repeat creatinine 1.10 - continue to encourage po intake/hydration and trend - repeating BMP on Sunday  Leukocytosis - resolved - Repeat WBC 8.3 on 09/19/21  Elevated ALT s/p overdose - resolved - Repeat ALT 36 and AST 17   3. Safety and Monitoring: Involuntary admission to inpatient psychiatric unit for safety, stabilization and treatment Daily contact with patient to assess and evaluate symptoms and progress in treatment Patient's case  to be discussed in multi-disciplinary team meeting Observation Level : q15 minute checks Vital signs: q12 hours Precautions: suicide, elopement   4. Discharge Planning: Social work and case management to assist with discharge planning and identification of hospital follow-up needs prior to discharge Estimated LOS:  5-7 days Discharge Concerns: Need to establish a safety plan; Medication compliance and effectiveness Discharge Goals: Patient interested in [facility] or [facility] in another city, needs outpatient referrals for mental health follow-up including medication management/psychotherapy    I certify that inpatient services furnished can reasonably be expected to improve the patient's condition.    Lindell Spar, NP, pmhnp, fnp-bc 09/21/2021, 3:20 PM Patient ID: Denise Knight, female   DOB: 1966/11/01, 55 y.o.   MRN: [id number]

## 2021-09-21 NOTE — BHH Suicide Risk Assessment (Signed)
[place] INPATIENT:  Family/Significant Other Suicide Prevention Education  Suicide Prevention Education:  Education Completed; Denise Knight, daughter,  (name of family member/significant other) has been identified by the patient as the family member/significant other with whom the patient will be residing, and identified as the person(s) who will aid the patient in the event of a mental health crisis (suicidal ideations/suicide attempt).  With written consent from the patient, the family member/significant other has been provided the following suicide prevention education, prior to the and/or following the discharge of the patient.  CSW spoke to patient daughter who reports that she didn't notice anything prior to patient suicide attempt as cause to alarm. Daughter reports no additional safety concerns and is amenable to plan for patient to go to [facility] for housing and treatment. No additional safety concerns noted and education provided.   The suicide prevention education provided includes the following: Suicide risk factors Suicide prevention and interventions National Suicide Hotline telephone number [facility] assessment telephone number [facility] Emergency Assistance [place] and/or Residential Mobile Crisis Unit telephone number  Request made of family/significant other to: Remove weapons (e.g., guns, rifles, knives), all items previously/currently identified as safety concern.   Remove drugs/medications (over-the-counter, prescriptions, illicit drugs), all items previously/currently identified as a safety concern.  The family member/significant other verbalizes understanding of the suicide prevention education information provided.  The family member/significant other agrees to remove the items of safety concern listed above.  Denise Knight E Denise Knight 09/21/2021, 11:34 AM

## 2021-09-21 NOTE — BHH Group Notes (Signed)
Patient attended music therapy group and was active in discussion of using music as a coping skill.

## 2021-09-21 NOTE — Progress Notes (Signed)
   09/20/21 2300  Psych Admission Type (Psych Patients Only)  Admission Status Voluntary  Psychosocial Assessment  Patient Complaints Anxiety  Eye Contact Fair;Brief  Facial Expression Worried;Sad  Affect Appropriate to circumstance  Speech Soft  Interaction Assertive  Motor Activity Unsteady  Appearance/Hygiene Unremarkable  Behavior Characteristics Appropriate to situation;Cooperative  Mood Anxious  Aggressive Behavior  Effect No apparent injury  Thought Process  Coherency WDL  Content WDL  Delusions None reported or observed  Perception WDL  Hallucination None reported or observed  Judgment Poor  Confusion None  Danger to Self  Current suicidal ideation? Passive  Self-Injurious Behavior No self-injurious ideation or behavior indicators observed or expressed   Agreement Not to Harm Self Yes  Description of Agreement Verbal contract  Danger to Others  Danger to Others None reported or observed   Patient compliant with programming and on unit rules. Scheduled medications administered per Provider order. Support and encouragement provided. Routine safety checks conducted every 15 minutes. Patient notified to inform staff with problems or concerns.  No adverse drug reactions noted. Patient contracts for safety at this time. Will continue to monitor.

## 2021-09-22 MED ORDER — POLYETHYLENE GLYCOL 3350 17 G PO PACK
17.0000 g | PACK | Freq: Every day | ORAL | Status: DC
  Administered 2021-09-22 – 2021-09-24 (×3): 17 g via ORAL
  Filled 2021-09-22 (×5): qty 1

## 2021-09-22 MED ORDER — QUETIAPINE FUMARATE 50 MG PO TABS
150.0000 mg | ORAL_TABLET | Freq: Every day | ORAL | Status: DC
  Administered 2021-09-22 – 2021-09-24 (×3): 150 mg via ORAL
  Filled 2021-09-22 (×5): qty 1

## 2021-09-22 MED ORDER — HYDROXYZINE HCL 25 MG PO TABS
25.0000 mg | ORAL_TABLET | Freq: Three times a day (TID) | ORAL | Status: DC | PRN
  Administered 2021-09-22 – 2021-09-24 (×4): 25 mg via ORAL
  Filled 2021-09-22 (×4): qty 1

## 2021-09-22 MED ORDER — ONDANSETRON 4 MG PO TBDP
4.0000 mg | ORAL_TABLET | Freq: Three times a day (TID) | ORAL | Status: DC | PRN
  Administered 2021-09-22: 4 mg via ORAL

## 2021-09-22 MED ORDER — ONDANSETRON 4 MG PO TBDP: 4.0000 mg | ORAL_TABLET | Freq: Four times a day (QID) | ORAL | Status: DC | PRN

## 2021-09-22 MED ORDER — LOPERAMIDE HCL 2 MG PO CAPS: 2.0000 mg | ORAL_CAPSULE | ORAL | Status: DC | PRN

## 2021-09-22 MED ORDER — ACETAMINOPHEN 325 MG PO TABS: 325.0000 mg | ORAL_TABLET | Freq: Four times a day (QID) | ORAL | Status: DC | PRN

## 2021-09-22 NOTE — Progress Notes (Signed)
[facility] Progress Note  09/22/2021 9:58 AM Jewelle Whitner  MRN:  [id number]  Subjective: [place] reports, "I have been back in the hospital since Wednesday of this week. I got discharged from here not too long ago. When I left the hospital, I had a little bit money with me, that was the trigger. I slipped & used crack cocaine. I got discouraged afterwards & tried to commit suicide by taking an overdose on a bottle tylenol PM. The Tylenol pm did not kill me, but damaged my kidney.  The only complain I have today is low energy. I'm still feeling depressed. My depression is #8 today".  Chart Review from last 24 hours:  The patient's chart was reviewed and nursing notes were reviewed. The patient's case was discussed in multidisciplinary team meeting. Per nursing, patient had no acute safety or behavior concerns and attended some groups. Per [facility] she was compliant with her scheduled medications except Flovent inhaler. she received Robitussin, and milk of mag yesterday, and Vistaril today.  Daily notes: Blenda is seen today. Chart reviewed.  She presents alert, oriented & aware of situation. She has been visible on unit, attending group sessions.  Patient states her mood is "all right".  She reports improvement in her mood and anxiety.  She is requesting Vistaril for anxiety.  She rates her depression at 8/10 and anxiety at 8/10 on a scale of 1-10 with 10 being severe symptoms.  Currently, she denies any active or passive suicidal ideation, homicidal ideation, auditory and visual hallucinations.  She denies any paranoia.  She denies any withdrawal symptoms.  She states that she had some cravings for alcohol yesterday but denies any cravings today.  She states that she slept fair last night and woke up twice. She reports good appetite. She states she has been accepted to sober living and they told her to call again tomorrow.  She states that she would need to go back home after discharge to get her clothes as  all of her belongings are currently in a storage unit.  She states if the transportation cannot stop at the storage unit then her daughter can pick her up from hospital after discharge and can drop her to sober living.  She states she is very motivated to go there and will not lose her chance.  She has been tolerating her medications without any side effects.  She reports some constipation.  She states that she took milk of magnesium yesterday but it just made her have more gas with no bowel movement.  Discussed adding some MiraLAX.  She verbalizes understanding.  She also reports some tenderness and muscle tension in her right arm.  She denies any shortness of breath and reports improvement in her cough.  She states she did not even need to use her nebulization as she was breathing well.  She denies any other concerns.  Principal Problem: Bipolar II disorder, most recent episode major depressive ([place])  Diagnosis: Principal Problem:   Bipolar II disorder, most recent episode major depressive ([place]) Active Problems:   Cocaine abuse ([place])   Primary hypertension   Asthma exacerbation, mild  Total Time Spent in Direct Patient Care:  I personally spent 30 minutes on the unit in direct patient care. The direct patient care time included face-to-face time with the patient, reviewing the patient's chart, communicating with other professionals, and coordinating care. Greater than 50% of this time was spent in counseling or coordinating care with the patient regarding goals of hospitalization,  psycho-education, and discharge planning needs.  Past Psychiatric History: See H&P  Past Medical History:  Past Medical History:  Diagnosis Date   Allergy    seasonal   Anxiety    Arthritis    "left knee" (07/05/2016)   Asthma    Bipolar 1 disorder ([place])    Depression    Diabetes mellitus without complication (HCC)    pre-   GERD (gastroesophageal reflux disease)    Hypertension    MI (mitral incompetence)     Post traumatic stress disorder    Schizophrenia ([place])    Stomach ulcer    Substance abuse ([place])    cocaine quit july 2020   Suicide attempt [facility])     Past Surgical History:  Procedure Laterality Date   CARDIAC CATHETERIZATION  07/05/2016   CARDIAC CATHETERIZATION N/A 07/05/2016   Procedure: Left Heart Cath and Coronary Angiography;  Surgeon: Adrian Prows, MD;  Location: [place] [facility];  Service: Cardiovascular;  Laterality: N/A;   CYST EXCISION Right    "wrist"   DILATION AND CURETTAGE OF UTERUS     FOOT SURGERY Bilateral    "corns removed"   LEFT HEART CATH AND CORONARY ANGIOGRAPHY N/A 12/24/2017   Procedure: LEFT HEART CATH AND CORONARY ANGIOGRAPHY;  Surgeon: Nigel Mormon, MD;  Location: [place] [facility];  Service: Cardiovascular;  Laterality: N/A;   TUBAL LIGATION     Family History:  Family History  Problem Relation Age of Onset   Breast cancer Other    Lung cancer Other    Congestive Heart Failure Other    Diabetes Other    Hypertension Other    Colon cancer Neg Hx    Colon polyps Neg Hx    Esophageal cancer Neg Hx    Stomach cancer Neg Hx    Rectal cancer Neg Hx    Family Psychiatric  History: See H&P Social History:  Social History   Substance and Sexual Activity  Alcohol Use Yes   Alcohol/week: 7.0 standard drinks   Types: 7 Cans of beer per week     Social History   Substance and Sexual Activity  Drug Use Yes   Types: Marijuana    Social History   Socioeconomic History   Marital status: Widowed    Spouse name: Not on file   Number of children: 2   Years of education: Not on file   Highest education level: Not on file  Occupational History   Occupation: disabled   Tobacco Use   Smoking status: Some Days    Packs/day: 0.25    Years: 35.00    Pack years: 8.75    Types: Cigarettes    Last attempt to quit: 05/30/2019    Years since quitting: 2.3   Smokeless tobacco: Never  Vaping Use   Vaping Use: Never used  Substance and Sexual  Activity   Alcohol use: Yes    Alcohol/week: 7.0 standard drinks    Types: 7 Cans of beer per week   Drug use: Yes    Types: Marijuana   Sexual activity: Yes    Birth control/protection: Surgical  Other Topics Concern   Not on file  Social History Narrative   Not on file   Social Determinants of Health   Financial Resource Strain: Not on file  Food Insecurity: Not on file  Transportation Needs: Not on file  Physical Activity: Not on file  Stress: Not on file  Social Connections: Not on file   Sleep: Fair  6 hours, woke up twice  Appetite:  Good,  Current Medications: Current Facility-Administered Medications  Medication Dose Route Frequency Provider Last Rate Last Admin   albuterol (PROVENTIL) (2.5 MG/3ML) 0.083% nebulizer solution 2.5 mg  2.5 mg Inhalation Q6H PRN Suella Broad, FNP   2.5 mg at 09/19/21 0934   albuterol (VENTOLIN HFA) 108 (90 Base) MCG/ACT inhaler 1-2 puff  1-2 puff Inhalation Q6H PRN Nelda Marseille, Amy E, MD       alum & mag hydroxide-simeth (MAALOX/MYLANTA) 200-200-20 MG/5ML suspension 30 mL  30 mL Oral Q4H PRN Starkes-Perry, Gayland Curry, FNP       amLODipine (NORVASC) tablet 10 mg  10 mg Oral Daily Suella Broad, FNP   10 mg at 09/22/21 0759   fluticasone (FLOVENT HFA) 110 MCG/ACT inhaler 1 puff  1 puff Inhalation BID Harlow Asa, MD   1 puff at 66/29/47 6546   folic acid (FOLVITE) tablet 1 mg  1 mg Oral Daily Jemaine Prokop, MD   1 mg at 09/22/21 0759   guaiFENesin (ROBITUSSIN) 100 MG/5ML liquid 10 mL  10 mL Oral Q4H PRN Nelda Marseille, Amy E, MD   10 mL at 09/21/21 1721   hydrOXYzine (ATARAX/VISTARIL) tablet 25 mg  25 mg Oral Q6H PRN Harlow Asa, MD   25 mg at 09/21/21 2107   lamoTRIgine (LAMICTAL) tablet 25 mg  25 mg Oral Daily Suella Broad, FNP   25 mg at 09/22/21 0759   loperamide (IMODIUM) capsule 2-4 mg  2-4 mg Oral PRN Harlow Asa, MD       loratadine (CLARITIN) tablet 10 mg  10 mg Oral Daily Kyle, Tyrone A, DO   10 mg at  09/22/21 0759   LORazepam (ATIVAN) tablet 1 mg  1 mg Oral Q6H PRN Nelda Marseille, Amy E, MD       magnesium hydroxide (MILK OF MAGNESIA) suspension 30 mL  30 mL Oral Daily PRN Suella Broad, FNP   30 mL at 09/21/21 2109   melatonin tablet 5 mg  5 mg Oral QHS Nelda Marseille, Amy E, MD   5 mg at 09/21/21 2107   mirtazapine (REMERON) tablet 15 mg  15 mg Oral QHS Suella Broad, FNP   15 mg at 09/21/21 2107   multivitamin with minerals tablet 1 tablet  1 tablet Oral Daily Harlow Asa, MD   1 tablet at 09/22/21 0758   mupirocin ointment (BACTROBAN) 2 %   Nasal BID Harlow Asa, MD   Given at 09/21/21 2118   nicotine polacrilex (NICORETTE) gum 2 mg  2 mg Oral PRN Harlow Asa, MD       ondansetron (ZOFRAN-ODT) disintegrating tablet 4 mg  4 mg Oral Q6H PRN Nelda Marseille, Amy E, MD       pantoprazole (PROTONIX) EC tablet 40 mg  40 mg Oral q morning Suella Broad, FNP   40 mg at 09/22/21 0759   predniSONE (DELTASONE) tablet 40 mg  40 mg Oral Daily Kyle, Tyrone A, DO   40 mg at 09/22/21 0758   QUEtiapine (SEROQUEL) tablet 100 mg  100 mg Oral QHS Nelda Marseille, Amy E, MD   100 mg at 09/21/21 2107   simvastatin (ZOCOR) tablet 40 mg  40 mg Oral Q supper Suella Broad, FNP   40 mg at 09/21/21 1721   sodium chloride (OCEAN) 0.65 % nasal spray 1 spray  1 spray Each Nare PRN Suella Broad, FNP       thiamine tablet 100  mg  100 mg Oral Daily Nelda Marseille, Amy E, MD   100 mg at 09/21/21 8144   vitamin B-12 (CYANOCOBALAMIN) tablet 1,000 mcg  1,000 mcg Oral Daily Suella Broad, FNP   1,000 mcg at 09/22/21 8185   Vitamin D (Ergocalciferol) (DRISDOL) capsule 50,000 Units  50,000 Units Oral Q7 days Harlow Asa, MD   50,000 Units at 09/19/21 1824   Lab Results:  No results found for this or any previous visit (from the past 48 hour(s)).  Blood Alcohol level:  Lab Results  Component Value Date   ETH <10 09/16/2021   ETH <10 63/14/9702   Metabolic Disorder Labs: Lab  Results  Component Value Date   HGBA1C 5.7 (H) 09/03/2021   MPG 116.89 09/03/2021   MPG 116.89 01/13/2020   No results found for: PROLACTIN Lab Results  Component Value Date   CHOL 230 (H) 09/03/2021   TRIG 111 09/03/2021   HDL 62 09/03/2021   CHOLHDL 3.7 09/03/2021   VLDL 22 09/03/2021   LDLCALC 146 (H) 09/03/2021   LDLCALC 114 (H) 01/13/2020    Physical Findings: CIWA:  CIWA-Ar Total: 0  Musculoskeletal: Strength & Muscle Tone: within normal limits Gait & Station:  Normal Patient leans:  Did not notice any leaning  Psychiatric Specialty Exam:  Presentation  General Appearance: Casual, dress appropriate for environment  Eye Contact:Fair  Speech:Clear and Coherent; Slow  Speech Volume:Decreased  Handedness:Right  Mood and Affect  Mood:Anxious; Depressed  Affect: Constricted, Mood-congruent  Thought Process  Thought Processes:Linear; Coherent  Descriptions of Associations:Intact  Orientation:Full (Time, Place and Person)  Thought Content:Logical - denies AVH, paranoia, or delusions   History of Schizophrenia/Schizoaffective disorder:No  Hallucinations:Denied Ideas of Reference:Denied  Suicidal Thoughts:Denied Homicidal Thoughts:Denied  Sensorium  Memory:Immediate Fair; Recent Fair; Remote Fair  Judgment:Fair  Insight:Fair  Executive Functions  Concentration:Fair  Attention Span:Fair  [place]  Psychomotor Activity  Psychomotor Activity:No agitation or retardation  Assets  Assets:Communication Skills; Desire for Improvement; Financial Resources/Insurance; Housing; Physical Health; Leisure Time  Sleep  Sleep: 6 hours  Physical Exam Vitals reviewed.  Constitutional:      General: She is not in acute distress. HENT:     Head: Normocephalic and atraumatic.  Pulmonary:     Effort: Pulmonary effort is normal.  Neurological:     Mental Status: She is alert and oriented to person, place,  and time.   Review of Systems  Constitutional:  Negative for chills, diaphoresis, fever, malaise/fatigue and weight loss.  HENT:  Negative for congestion.   Respiratory:  Negative for cough and shortness of breath.   Cardiovascular:  Negative for chest pain and leg swelling.  Gastrointestinal:  Positive for constipation. Negative for diarrhea, nausea and vomiting.  Genitourinary:  Negative for dysuria.  Musculoskeletal:  Negative for joint pain.       Muscle tension in right arm.  All other systems reviewed and are negative.  Blood pressure 121/90, pulse 91, temperature 98 F (36.7 C), temperature source Oral, resp. rate 18, height 5' 8"  (1.727 m), weight 88.5 kg, SpO2 98 %. Body mass index is 29.67 kg/m.  Treatment Plan Summary: Daily contact with patient to assess and evaluate symptoms and progress in treatment and Medication management   ASSESSMENT Active Problems:   Bipolar II MRE depressed severe without current psychotic features   PTSD by hx   Stimulant use d/o - cocaine type   Alcohol use d/o by hx   Cannabis use d/o in  early remission   Vitamin D deficiency   Hyperlipidemia   Asthma   HTN   GERD   Ms. Sameria Morss is a 55 year-old female with reported PPHx of depression, bipolar, PTSD who was admitted to [facility] from [place] ED due to suicide attempt with 32 pills of Tylenol PM. Patient was restarted on several medications, and she desires improvement in her mental state and sleep. Desires rehabilitation following discharge.   Labs reviewed:  CBC (11/9) - hemoglobin 11.4 CMP (11/9) - creatinine 1.10 (re-check Sunday, 08-23-21). No new labs today  PLAN Psychiatric Problems Bipolar II MRE depressed severe without current psychotic features PTSD by hx R/o substance induced mood d/o - ECG 11/8 was normal, QTC 460m - Increase Seroquel 150 mg qhs to improve sleep and help with mood stabilization - Risperdal discontinued.  - Continue Remeron 135mdaily at  bedtime. - Continue Lamictal 2566maily - Continue melatonin 5mg26mily at bedtime. - Stopped amantadine, which had been added for help with cocaine cravings to avoid return of psychosis as noted during previous inpatient admission.   Stimulant use d/o - cocaine type  Alcohol use d/o by hx  Cannabis use d/o in early remission - CIWA finished.  Last CIWA 0 - Continue thiamine 100mg19mly. - Continue multivitamin with minerals daily. - Continue folic acid 1mg d59my. -- Patient accepted at [facility].  [place] her to call back tomorrow.    Medical Problems  Asthma Cough/SOB - Medicine was consulted and continue the following recs:       - Continue Robitussin 100mg/574myrup q4 PRN      - Continue albuterol MDI and nebs and  Flovent       - Continue steroid burst with prednisone 40mg da35mfor 5 days (11/12 Day 4) - monitoring for psychosis or mood escalation on steroid      - Continue loratadine 10mg dai54m    - Chest x-ray negative  Muscle tension in Hand -Tylenol as needed  HTN - Continue Norvasc 10mg - CT69mth daily vitals   Hyperlipidemia - Continue simvastatin 40mg    GE49m Continue Protonix 40mg   Vita68mDeficiencies - Continue Vitamin B12 and Vitamin D    Weakness Was evaluated by physical therapy, patient given permission to ambulate. Will CTM.   Nicotine dependence Nicotine gum as needed for smoking cessation   MRSA positive - Finish bactroban nasal BID X 7 days per pharmacist recommendation  Recent AKI s/p overdose - Repeat creatinine 1.10 - continue to encourage po intake/hydration and trend - repeating BMP on Sunday  Leukocytosis - resolved - Repeat WBC 8.3 on 09/19/21  Elevated ALT s/p overdose - resolved - Repeat ALT 36 and AST 17   3. Safety and Monitoring: Involuntary admission to inpatient psychiatric unit for safety, stabilization and treatment Daily contact with patient to assess and evaluate symptoms and progress in treatment Patient's case to be  discussed in multi-disciplinary team meeting Observation Level : q15 minute checks Vital signs: q12 hours Precautions: suicide, elopement   4. Discharge Planning: Social work and case management to assist with discharge planning and identification of hospital follow-up needs prior to discharge Estimated LOS: 5-7 days Discharge Concerns: Need to establish a safety plan; Medication compliance and effectiveness Dis[facility]als: [facility], needs outpatient referrals for mental health follow-up including medication management/psychotherapy   Shep Porter  DodArmando Reichert1/10/2021, 9:58 AM

## 2021-09-22 NOTE — Group Note (Signed)
Date:  09/22/2021 Time:  9:22 AM  Group Topic/Focus:  Goals Group:   The focus of this group is to help patients establish daily goals to achieve during treatment and discuss how the patient can incorporate goal setting into their daily lives to aide in recovery. Orientation:   The focus of this group is to educate the patient on the purpose and policies of crisis stabilization and provide a format to answer questions about their admission.  The group details unit policies and expectations of patients while admitted.    Participation Level:  Did Not Attend

## 2021-09-22 NOTE — BHH Group Notes (Signed)
1600 - PsychoEducational group- Patients were educated on the power of negative versus positive thinking, and the habits of both and impact on our mental health. Patients were educated on positive re-framing and given examples in day to day life. Pts were given example by showing spaghetti noodles, while together, one thought is not much, over and over becomes more powerful, and harder to break. Patients were then asked to break a noodle and give an example of a negative thought in their life they needed to break free from. Pt participated and was appropriate.

## 2021-09-22 NOTE — Group Note (Signed)
LCSW Group Therapy Note  09/22/2021    10:00-11:00am   Type of Therapy and Topic:  Group Therapy: Early Messages Received About Anger  Participation Level:  None   Description of Group:   In this group, patients shared and discussed the early messages received in their lives about anger through parental or other adult modeling, teaching, repression, punishment, violence, and more.  Participants identified how those childhood lessons influence even now how they usually or often react when angered.  The group discussed that anger is a secondary emotion and what may be the underlying emotional themes that come out through anger outbursts or that are ignored through anger suppression.    Therapeutic Goals: Patients will identify one or more childhood message about anger that they received and how it was taught to them. Patients will discuss how these childhood experiences have influenced and continue to influence their own expression or repression of anger even today. Patients will explore possible primary emotions that tend to fuel their secondary emotion of anger. Patients will learn that anger itself is normal and cannot be eliminated, and that healthier coping skills can assist with resolving conflict rather than worsening situations.  Summary of Patient Progress:  The patient shared that her childhood lessons about anger were nonexistent, "I didn't grow up around violence."  She was not happy to be in group and said it was the "same 'ol stuff."  She said that she bottles up her emotions and does not know how to express them.  She added that she is afraid if she expresses her feelings, she will explode.  She did not remain in group, but got up and left after sharing.    Therapeutic Modalities:   Cognitive Behavioral Therapy Motivation Interviewing  Maretta Los  .

## 2021-09-22 NOTE — Progress Notes (Signed)
Pt presents with pleasant affect, slightly depressed mood but cooperative and appropriate. She states that she slept better than she did last night, but woke up a few times. She states that anxiety continues to be an issue for her, but does feel that it is improving. The patient denies any SI/HI/A/V Hallucinations. No signs of responding to internal stimuli. Pt did not complete self inventory. Pt attended group and states she feels she has learned a lot from being here.

## 2021-09-22 NOTE — Progress Notes (Signed)
   09/22/21 2105  Psych Admission Type (Psych Patients Only)  Admission Status Voluntary  Psychosocial Assessment  Patient Complaints Depression  Eye Contact Fair  Facial Expression Animated  Affect Appropriate to circumstance  Speech Logical/coherent  Interaction Assertive  Motor Activity Slow  Appearance/Hygiene Unremarkable  Behavior Characteristics Cooperative  Mood Pleasant;Depressed  Thought Process  Coherency WDL  Content WDL  Delusions None reported or observed  Perception WDL  Hallucination None reported or observed  Judgment Poor  Confusion None  Danger to Self  Current suicidal ideation? Denies  Danger to Others  Danger to Others None reported or observed   Pt seen after she was awakened for a phone call. Pt denies SI, HI, AVH and pain. Pt denies anxiety and rates depression 8.5/10. Pt received a call from her brother who discussed things she needed to do. Pt talks about how she is helping her daughter pay her rent. When asked if she will return to stay with her daughter, pt says no. "I'm going to [facility] in [place]. They will help me get clean, get a job and get my life on track. I don't mind helping my daughter and my grandchildren but I've got to get my life straight." Pt refused her Flovent tonight. Says she hasn't needed it all day. Pt did c/o bloating and excess flatulence. "I haven't had a bowel movement since day before yesterday. Everything they give me is not making me go but giving me more gas." Pt has also asked for her Protonix to be given before breakfast to avoid nausea and vomiting.

## 2021-09-22 NOTE — Group Note (Signed)
Date:  09/22/2021 Time:  10:53 AM  Number of Participants: 10  Group Focus: daily focus Treatment Modality:  Psychoeducation Interventions utilized were group exercise Purpose: express feelings and increase insight   Participation Level: Pt attended psycho-ed group with RN.

## 2021-09-23 LAB — BASIC METABOLIC PANEL
Anion gap: 11 (ref 5–15)
BUN: 19 mg/dL (ref 6–20)
CO2: 23 mmol/L (ref 22–32)
Calcium: 9.3 mg/dL (ref 8.9–10.3)
Chloride: 106 mmol/L (ref 98–111)
Creatinine, Ser: 0.99 mg/dL (ref 0.44–1.00)
GFR, Estimated: >60 mL/min (ref >60)
Glucose, Bld: 148 mg/dL — ABNORMAL HIGH (ref 70–99)
Potassium: 3.6 mmol/L (ref 3.5–5.1)
Sodium: 140 mmol/L (ref 135–145)

## 2021-09-23 MED ORDER — BISACODYL 5 MG PO TBEC: 10.0000 mg | DELAYED_RELEASE_TABLET | Freq: Every day | ORAL | Status: DC | PRN

## 2021-09-23 MED ORDER — BISACODYL 5 MG PO TBEC: 5.0000 mg | DELAYED_RELEASE_TABLET | Freq: Every day | ORAL | Status: DC | PRN

## 2021-09-23 MED ORDER — TRAZODONE HCL 50 MG PO TABS
50.0000 mg | ORAL_TABLET | Freq: Every evening | ORAL | Status: DC | PRN
  Administered 2021-09-24: 50 mg via ORAL
  Filled 2021-09-23: qty 1

## 2021-09-23 MED ORDER — FLEET ENEMA 7-19 GM/118ML RE ENEM
1.0000 | ENEMA | Freq: Once | RECTAL | Status: AC
  Administered 2021-09-23: 1 via RECTAL
  Filled 2021-09-23: qty 1

## 2021-09-23 MED ORDER — GABAPENTIN 100 MG PO CAPS
100.0000 mg | ORAL_CAPSULE | Freq: Three times a day (TID) | ORAL | Status: DC
  Administered 2021-09-23 – 2021-09-25 (×6): 100 mg via ORAL
  Filled 2021-09-23 (×9): qty 1

## 2021-09-23 MED ORDER — MELATONIN 5 MG PO TABS
10.0000 mg | ORAL_TABLET | Freq: Every day | ORAL | Status: DC
  Administered 2021-09-23 – 2021-09-24 (×2): 10 mg via ORAL
  Filled 2021-09-23 (×4): qty 2

## 2021-09-23 NOTE — B[facility] Group Notes (Signed)
Adult Psychoeducational Group Not Date:  09/23/2021 Time:  0900-1045 Group Topic/Focus: PROGRESSIVE RELAXATION. A group where deep breathing is taught and tensing and relaxation muscle groups is used. Imagery is used as well.  Pts are asked to imagine 3 pillars that hold them up when they are not able to hold themselves up.  Participation Level:  did not attend Additional Comments:    Paulino Rily

## 2021-09-23 NOTE — Progress Notes (Signed)
Adult Psychoeducational Group Note  Date:  09/23/2021 Time:  10:27 PM  Group Topic/Focus:  Wrap-Up Group:   The focus of this group is to help patients review their daily goal of treatment and discuss progress on daily workbooks.  Participation Level:  Active  Participation Quality:  Appropriate  Affect:  Appropriate  Cognitive:  Appropriate  Insight: Appropriate  Engagement in Group:  Engaged  Modes of Intervention:  Discussion  Additional Comments:  Pt stated her goal for the day is to reach out to a [employer].  Pt stated she met her goal and called the [facility] and left a voicemail.  Tonia Brooms D 09/23/2021, 10:27 PM

## 2021-09-23 NOTE — Progress Notes (Addendum)
[facility] Progress Note  09/23/2021 1:13 PM Denise Knight  MRN:  [id number]  Subjective: In short: Denise Knight is a 55 year old female with reported PPHx of depression, bipolar, PTSD who was admitted to [facility] from [place] ED due to suicide attempt with 32 pills of Tylenol PM.   Chart Review from last 24 hours:  The patient's chart was reviewed and nursing notes were reviewed. The patient's case was discussed in multidisciplinary team meeting. Per nursing, patient had no acute safety or behavior concerns and attended some groups. Per MAR she was compliant with her scheduled medications except Flovent inhaler yesterday and today and Bactroban ointment, thiamine, and Protonix today. She received  Zofran, Maalox, and Vistaril yesterday, and Vistaril today.  She slept 5 hours.  Daily notes: Denise Knight is seen today in her room lying in her bed. Chart reviewed.  She presents alert, oriented & aware of situation.  Patient states her mood is "tired".  She states that she did not sleep well last night and woke up multiple times.  She reports improvement in her mood and anxiety. She is requesting Vistaril for anxiety. She rates her depression at 7/10 on a scale of 1-10 with 10 being severe symptoms.  Currently, she denies any active or passive suicidal ideation, homicidal ideation, auditory and visual hallucinations.  She denies any paranoia.  She denies any withdrawal symptoms.  She states that she has some alcohol cravings today.   She reports good appetite. She states she will call sober living today after lunch and will ask for time up to which she can get there.  She states that her daughter is upset with her as she refused to cosign for her car and give her money.  She states her daughter will not pick her up at discharge but one of her friends can pick her up at discharge and drop her to train station for [place].  She states if they want her to get there early then she has to go directly via  safe transportation and buy clothes from her saved money. She states she is very motivated to go there and will not lose her chance.  She has been tolerating her medications without any side effects.  She still reports constipation.  She took MiraLAX and milk of magnesia and she is passing gas but did not have a bowel movement yet.  She is requesting enema.  She denies any shortness of breath and reports improvement in her cough. She denies any other concerns.  Principal Problem: Bipolar II disorder, most recent episode major depressive ([place])  Diagnosis: Principal Problem:   Bipolar II disorder, most recent episode major depressive ([place]) Active Problems:   Cocaine abuse ([place])   Primary hypertension   Asthma exacerbation, mild  Total Time Spent in Direct Patient Care:  I personally spent 30 minutes on the unit in direct patient care. The direct patient care time included face-to-face time with the patient, reviewing the patient's chart, communicating with other professionals, and coordinating care. Greater than 50% of this time was spent in counseling or coordinating care with the patient regarding goals of hospitalization, psycho-education, and discharge planning needs.  Past Psychiatric History: See H&P  Past Medical History:  Past Medical History:  Diagnosis Date   Allergy    seasonal   Anxiety    Arthritis    "left knee" (07/05/2016)   Asthma    Bipolar 1 disorder ([place])    Depression    Diabetes mellitus without  complication (HCC)    pre-   GERD (gastroesophageal reflux disease)    Hypertension    MI (mitral incompetence)    Post traumatic stress disorder    Schizophrenia ([place])    Stomach ulcer    Substance abuse ([place])    cocaine quit july 2020   Suicide attempt [facility])     Past Surgical History:  Procedure Laterality Date   CARDIAC CATHETERIZATION  07/05/2016   CARDIAC CATHETERIZATION N/A 07/05/2016   Procedure: Left Heart Cath and Coronary Angiography;  Surgeon: Adrian Prows,  MD;  Location: [place] [facility];  Service: Cardiovascular;  Laterality: N/A;   CYST EXCISION Right    "wrist"   DILATION AND CURETTAGE OF UTERUS     FOOT SURGERY Bilateral    "corns removed"   LEFT HEART CATH AND CORONARY ANGIOGRAPHY N/A 12/24/2017   Procedure: LEFT HEART CATH AND CORONARY ANGIOGRAPHY;  Surgeon: Nigel Mormon, MD;  Location: [place] [facility];  Service: Cardiovascular;  Laterality: N/A;   TUBAL LIGATION     Family History:  Family History  Problem Relation Age of Onset   Breast cancer Other    Lung cancer Other    Congestive Heart Failure Other    Diabetes Other    Hypertension Other    Colon cancer Neg Hx    Colon polyps Neg Hx    Esophageal cancer Neg Hx    Stomach cancer Neg Hx    Rectal cancer Neg Hx    Family Psychiatric  History: See H&P Social History:  Social History   Substance and Sexual Activity  Alcohol Use Yes   Alcohol/week: 7.0 standard drinks   Types: 7 Cans of beer per week     Social History   Substance and Sexual Activity  Drug Use Yes   Types: Marijuana    Social History   Socioeconomic History   Marital status: Widowed    Spouse name: Not on file   Number of children: 2   Years of education: Not on file   Highest education level: Not on file  Occupational History   Occupation: disabled   Tobacco Use   Smoking status: Some Days    Packs/day: 0.25    Years: 35.00    Pack years: 8.75    Types: Cigarettes    Last attempt to quit: 05/30/2019    Years since quitting: 2.3   Smokeless tobacco: Never  Vaping Use   Vaping Use: Never used  Substance and Sexual Activity   Alcohol use: Yes    Alcohol/week: 7.0 standard drinks    Types: 7 Cans of beer per week   Drug use: Yes    Types: Marijuana   Sexual activity: Yes    Birth control/protection: Surgical  Other Topics Concern   Not on file  Social History Narrative   Not on file   Social Determinants of Health   Financial Resource Strain: Not on file  Food  Insecurity: Not on file  Transportation Needs: Not on file  Physical Activity: Not on file  Stress: Not on file  Social Connections: Not on file   Sleep: Fair 5 hours, woke up multiple times  Appetite:  Good,  Current Medications: Current Facility-Administered Medications  Medication Dose Route Frequency Provider Last Rate Last Admin   acetaminophen (TYLENOL) tablet 325 mg  325 mg Oral Q6H PRN Doda, Vandana, MD       albuterol (PROVENTIL) (2.5 MG/3ML) 0.083% nebulizer solution 2.5 mg  2.5 mg Inhalation Q6H  PRN Suella Broad, FNP   2.5 mg at 09/19/21 0934   albuterol (VENTOLIN HFA) 108 (90 Base) MCG/ACT inhaler 1-2 puff  1-2 puff Inhalation Q6H PRN Nelda Marseille, Luz Mares E, MD       alum & mag hydroxide-simeth (MAALOX/MYLANTA) 200-200-20 MG/5ML suspension 30 mL  30 mL Oral Q4H PRN Suella Broad, FNP   30 mL at 09/22/21 1634   amLODipine (NORVASC) tablet 10 mg  10 mg Oral Daily Suella Broad, FNP   10 mg at 09/23/21 0842   fluticasone (FLOVENT HFA) 110 MCG/ACT inhaler 1 puff  1 puff Inhalation BID Harlow Asa, MD   1 puff at 77/93/90 3009   folic acid (FOLVITE) tablet 1 mg  1 mg Oral Daily Doda, Edd Arbour, MD   1 mg at 09/23/21 0842   guaiFENesin (ROBITUSSIN) 100 MG/5ML liquid 10 mL  10 mL Oral Q4H PRN Viann Fish E, MD   10 mL at 09/21/21 1721   hydrOXYzine (ATARAX/VISTARIL) tablet 25 mg  25 mg Oral TID PRN Armando Reichert, MD   25 mg at 09/23/21 0840   lamoTRIgine (LAMICTAL) tablet 25 mg  25 mg Oral Daily Suella Broad, FNP   25 mg at 09/23/21 2330   loperamide (IMODIUM) capsule 2-4 mg  2-4 mg Oral PRN Harlow Asa, MD       loratadine (CLARITIN) tablet 10 mg  10 mg Oral Daily Kyle, Tyrone A, DO   10 mg at 09/22/21 0759   magnesium hydroxide (MILK OF MAGNESIA) suspension 30 mL  30 mL Oral Daily PRN Suella Broad, FNP   30 mL at 09/21/21 2109   melatonin tablet 5 mg  5 mg Oral QHS Nelda Marseille, Jencarlos Nicolson E, MD   5 mg at 09/22/21 2108   mirtazapine  (REMERON) tablet 15 mg  15 mg Oral QHS Suella Broad, FNP   15 mg at 09/22/21 2108   multivitamin with minerals tablet 1 tablet  1 tablet Oral Daily Harlow Asa, MD   1 tablet at 09/23/21 0841   mupirocin ointment (BACTROBAN) 2 %   Nasal BID Harlow Asa, MD   Given at 09/22/21 1634   nicotine polacrilex (NICORETTE) gum 2 mg  2 mg Oral PRN Harlow Asa, MD       ondansetron (ZOFRAN-ODT) disintegrating tablet 4 mg  4 mg Oral Q8H PRN Ethelene Hal, NP   4 mg at 09/22/21 1806   ondansetron (ZOFRAN-ODT) disintegrating tablet 4 mg  4 mg Oral Q6H PRN Harlow Asa, MD       pantoprazole (PROTONIX) EC tablet 40 mg  40 mg Oral q morning Suella Broad, FNP   40 mg at 09/23/21 0762   polyethylene glycol (MIRALAX / GLYCOLAX) packet 17 g  17 g Oral Daily Armando Reichert, MD   17 g at 09/23/21 0841   QUEtiapine (SEROQUEL) tablet 150 mg  150 mg Oral QHS Armando Reichert, MD   150 mg at 09/22/21 2108   simvastatin (ZOCOR) tablet 40 mg  40 mg Oral Q supper Suella Broad, FNP   40 mg at 09/22/21 1634   sodium chloride (OCEAN) 0.65 % nasal spray 1 spray  1 spray Each Nare PRN Starkes-Perry, Gayland Curry, FNP       sodium phosphate (FLEET) 7-19 GM/118ML enema 1 enema  1 enema Rectal Once Armando Reichert, MD       thiamine tablet 100 mg  100 mg Oral Daily Harlow Asa, MD  100 mg at 09/22/21 1317   vitamin B-12 (CYANOCOBALAMIN) tablet 1,000 mcg  1,000 mcg Oral Daily Suella Broad, FNP   1,000 mcg at 09/23/21 5809   Vitamin D (Ergocalciferol) (DRISDOL) capsule 50,000 Units  50,000 Units Oral Q7 days Harlow Asa, MD   50,000 Units at 09/19/21 1824   Lab Results:  Results for orders placed or performed during the hospital encounter of 09/18/21 (from the past 48 hour(s))  Basic metabolic panel     Status: Abnormal   Collection Time: 09/23/21  7:22 AM  Result Value Ref Range   Sodium 140 135 - 145 mmol/L   Potassium 3.6 3.5 - 5.1 mmol/L   Chloride 106 98 - 111  mmol/L   CO2 23 22 - 32 mmol/L   Glucose, Bld 148 (H) 70 - 99 mg/dL    Comment: Glucose reference range applies only to samples taken after fasting for at least 8 hours.   BUN 19 6 - 20 mg/dL   Creatinine, Ser 0.99 0.44 - 1.00 mg/dL   Calcium 9.3 8.9 - 10.3 mg/dL   GFR, Estimated >60 >60 mL/min    Comment: (NOTE) Calculated using the CKD-EPI Creatinine Equation (2021)    Anion gap 11 5 - 15    Comment: Performed at [facility], [place] [address], [place], [place] [address]    Blood Alcohol level:  Lab Results  Component Value Date   [facility] <10 09/16/2021   ETH <10 25/03/3975   Metabolic Disorder Labs: Lab Results  Component Value Date   HGBA1C 5.7 (H) 09/03/2021   MPG 116.89 09/03/2021   MPG 116.89 01/13/2020   No results found for: PROLACTIN Lab Results  Component Value Date   CHOL 230 (H) 09/03/2021   TRIG 111 09/03/2021   HDL 62 09/03/2021   CHOLHDL 3.7 09/03/2021   VLDL 22 09/03/2021   LDLCALC 146 (H) 09/03/2021   LDLCALC 114 (H) 01/13/2020    Physical Findings: CIWA:  CIWA-Ar Total: 0  Musculoskeletal: Strength & Muscle Tone: within normal limits Gait & Station:  Normal Patient leans:  Back.  Patient is lying down  Psychiatric Specialty Exam:  Presentation  General Appearance: Casual, dress appropriate for environment  Eye Contact:Good  Speech:Clear and Coherent  Speech Volume:Normal  Handedness:Right  Mood and Affect  Mood:Anxious  Affect: anxious appearing  Thought Process  Thought Processes:Coherent; Linear, goal directed  Descriptions of Associations:Intact  Orientation:Full (Time, Place and Person)  Thought Content:Logical - denies AVH, paranoia, or delusions   History of Schizophrenia/Schizoaffective disorder:No  Hallucinations:Denied Ideas of Reference:Denied  Suicidal Thoughts:Denied Homicidal Thoughts:Denied  Sensorium  Memory:Immediate Fair; Recent Fair; Remote  Fair  Judgment:Fair  Insight:Fair  Executive Functions  Concentration:Fair  Attention Span:Fair  [place]  Psychomotor Activity  Psychomotor Activity:No agitation or retardation  Assets  Assets:Communication Skills; Desire for Improvement; Financial Resources/Insurance; Housing; Physical Health; Leisure Time  Sleep  Sleep: 6 hours  Physical Exam Vitals reviewed.  Constitutional:      General: She is not in acute distress. HENT:     Head: Normocephalic and atraumatic.  Pulmonary:     Effort: Pulmonary effort is normal.  Neurological:     Mental Status: She is alert and oriented to person, place, and time.   Review of Systems  Constitutional:  Negative for chills, diaphoresis, fever, malaise/fatigue and weight loss.  HENT:  Negative for congestion.   Respiratory:  Positive for cough. Negative for shortness of breath.  Cough improving  Cardiovascular:  Negative for chest pain and leg swelling.  Gastrointestinal:  Positive for constipation. Negative for diarrhea, nausea and vomiting.  Genitourinary:  Negative for dysuria.  Musculoskeletal:  Negative for joint pain.  All other systems reviewed and are negative.  Blood pressure 139/89, pulse 85, temperature 97.8 F (36.6 C), temperature source Oral, resp. rate 18, height 5' 8"  (1.727 m), weight 88.5 kg, SpO2 100 %. Body mass index is 29.67 kg/m.  Treatment Plan Summary: Daily contact with patient to assess and evaluate symptoms and progress in treatment and Medication management   ASSESSMENT Active Problems:   Bipolar II MRE depressed severe without current psychotic features   PTSD by hx   Stimulant use d/o - cocaine type   Alcohol use d/o by hx   Cannabis use d/o in early remission   Vitamin D deficiency   Hyperlipidemia   Asthma   HTN   GERD   Denise Knight is a 55 year-old female with reported PPHx of depression, bipolar, PTSD who was admitted  to [facility] from [place] ED due to suicide attempt with 32 pills of Tylenol PM. Patient was restarted on several medications, and she desires improvement in her mental state and sleep. Desires rehabilitation following discharge.   Labs reviewed:  CBC (11/9) - hemoglobin 11.4 CMP (11/13) - creatinine improved to 0.99 No new labs today  PLAN Psychiatric Problems Bipolar II MRE depressed severe without current psychotic features PTSD by hx R/o substance induced mood d/o - ECG 11/8 was normal, QTC 460m - Continue Seroquel 150 mg qhs to improve sleep and help with mood stabilization - Risperdal discontinued.  - Continue Remeron 15 mg daily at bedtime. - Continue Lamictal 280mdaily -Increase melatonin to 10 mg daily at bedtime. -Add trazodone 50 mg as needed for insomnia. - Stopped amantadine, which had been added for help with cocaine cravings to avoid return of psychosis as noted during previous inpatient admission.   Stimulant use d/o - cocaine type  Alcohol use d/o by hx  Cannabis use d/o in early remission - CIWA finished.  Last CIWA 0 - Continue thiamine 10068maily. - Continue multivitamin with minerals daily. - Continue folic acid 1mg66mily. - Start Neurontin 100 mg 3 times daily for alcohol cravings. -- [facility] today afternoon to ask the time she can get there.    Medical Problems  Asthma Cough/SOB - Medicine was consulted and continue the following recs:       - Continue Robitussin 100mg109m syrup q4 PRN      - Continue albuterol MDI and nebs and  Flovent       -Completed 5-day course of prednisone 40mg 33my-completed      - Continue loratadine 10mg d71m      - Chest x-ray negative  Constipation -Continue MiraLAX daily - order Fleet enema once -Start Dulcolax 10 mg daily as needed.  Muscle tension in Hand -Tylenol as needed  HTN - Continue Norvasc 10mg - 1mwith daily vitals   Hyperlipidemia - Continue simvastatin 40mg    34mD - Continue Protonix 40mg   Vi28mn Deficiencies - Continue Vitamin B12 and Vitamin D    Weakness Was evaluated by physical therapy, patient given permission to ambulate. Will CTM.   Nicotine dependence Nicotine gum as needed for smoking cessation   MRSA positive - Finish bactroban nasal BID X 7 days per pharmacist recommendation  Recent AKI s/p overdose (resolved) -  Repeat creatinine 0.99 on 11/13- continue to encourage po intake/hydration.  Leukocytosis - resolved - Repeat WBC 8.3 on 09/19/21  Elevated ALT s/p overdose - resolved - Repeat ALT 36 and AST 17   3. Safety and Monitoring: Involuntary admission to inpatient psychiatric unit for safety, stabilization and treatment Daily contact with patient to assess and evaluate symptoms and progress in treatment Patient's case to be discussed in multi-disciplinary team meeting Observation Level : q15 minute checks Vital signs: q12 hours Precautions: suicide, elopement   4. Discharge Planning: Social work and case management to assist with discharge planning and identification of hospital follow-up needs prior to discharge Estimated LOS: 5-7 days Discharge Concerns: Need to establish a safety plan; Medication compliance and effectiveness Discharge Goals: Patient is accepted to [facility] in [place] , needs outpatient referrals for mental health follow-up including medication management/psychotherapy   Armando Reichert, MD, PGY2 09/23/2021, 1:13 PM

## 2021-09-23 NOTE — Group Note (Signed)
[facility] LCSW Group Therapy Note  09/23/2021   10:00-11:00AM  Type of Therapy and Topic:  Group Therapy:  Unhealthy versus Healthy Supports, Which Am I?  Participation Level:  Minimal   Description of Group:  Patients in this group were introduced to the concept that additional supports including self-support are an essential part of recovery.  Initially a discussion was held about the differences between healthy versus unhealthy supports.  Patients were asked to share what unhealthy supports in their lives need to be addressed, as well as what additional healthy supports could be added for greater help in reaching their goals.   A song entitled "My Own Hero" was played and a group discussion ensued in which patients stated they could relate to the song and it inspired them to realize they have be willing to help themselves in order to succeed, because other people cannot achieve sobriety or stability for them.  We discussed adding a variety of healthy supports to address the various needs in patient lives, including becoming more self-supportive.  A song was played called "I Know Where I've Been" toward the end of group and used to conduct an inspirational wrap-up to group of remembering how far they have already come in their journey.  Therapeutic Goals: 1)  Highlight the differences between healthy and unhealthy supports 2)  Suggest the importance of being a part of one's own support system 2)  Discuss reasons people in one's life may eventually be unable to be continually supportive  3)  Identify the patient's current support system   4) Elicit commitments to add healthy supports and to become more conscious of being self-supportive   Summary of Patient Progress:  The patient participated fully once she arrived in the room, but she was only present for about the last 20 minutes.  She expressed enjoyment of and appreciation for the two songs played and the messages conveyed.  Therapeutic  Modalities:   Motivational Interviewing Activity  Maretta Los , MSW, LCSW

## 2021-09-23 NOTE — Plan of Care (Signed)
Patient is active in the milieu. Cooperative and denying thoughts of self-harm. Denying hallucinations.  Expressing readiness for discharge.

## 2021-09-23 NOTE — [facility] Group Notes (Signed)
Psychoeducational Group Note  Date:  09/23/2021 Time:  1300-1400   Group Topic/Focus: This is a continuation of the group from Saturday. Pt's have been asked to formulate a list of 30 positives about themselves. This list is to be read 2 times a day for 30 days, looking in a mirror. Changing patterns of negative self talk. Also discussed is the fact that there have been some people who hurt [place] in the past. We keep that memory alive within [place]. Ways to cope with this are discused   Participation Level:  Active  Participation Quality:  Appropriate  Affect:  Appropriate  Cognitive:  Oriented  Insight: Improving  Engagement in Group:  Engaged  Modes of Intervention:  Activity, Discussion, Education, and Support  Additional Comments:  Pt rates her energy at a 5. Came to group and became upset within the group from someone smacking their lips. Left the room for a bit and then returned  Paulino Rily

## 2021-09-23 NOTE — [facility] Group Notes (Signed)
Pt didn't attend goals group

## 2021-09-24 ENCOUNTER — Other Ambulatory Visit: Payer: Self-pay

## 2021-09-24 ENCOUNTER — Encounter (HOSPITAL_COMMUNITY): Payer: Self-pay

## 2021-09-24 MED ORDER — MUPIROCIN 2 % EX OINT: TOPICAL_OINTMENT | Freq: Two times a day (BID) | CUTANEOUS | 0 refills | Status: DC

## 2021-09-24 MED ORDER — PANTOPRAZOLE SODIUM 40 MG PO TBEC
40.0000 mg | DELAYED_RELEASE_TABLET | Freq: Every morning | ORAL | Status: DC
  Administered 2021-09-24 – 2021-09-25 (×2): 40 mg via ORAL
  Filled 2021-09-24 (×3): qty 1

## 2021-09-24 MED ORDER — BISACODYL 5 MG PO TBEC: 10.0000 mg | DELAYED_RELEASE_TABLET | Freq: Every day | ORAL | 0 refills | Status: DC | PRN

## 2021-09-24 MED ORDER — FOLIC ACID 1 MG PO TABS: 1.0000 mg | ORAL_TABLET | Freq: Every day | ORAL | 0 refills | Status: DC

## 2021-09-24 MED ORDER — MELATONIN 10 MG PO TABS: 10.0000 mg | ORAL_TABLET | Freq: Every day | ORAL | 0 refills | Status: DC

## 2021-09-24 MED ORDER — QUETIAPINE FUMARATE 150 MG PO TABS: 150.0000 mg | ORAL_TABLET | Freq: Every day | ORAL | 0 refills | Status: DC

## 2021-09-24 MED ORDER — NICOTINE POLACRILEX 2 MG MT GUM: 2.0000 mg | CHEWING_GUM | OROMUCOSAL | 0 refills | Status: DC | PRN

## 2021-09-24 MED ORDER — ADULT MULTIVITAMIN W/MINERALS CH: 1.0000 | ORAL_TABLET | Freq: Every day | ORAL | Status: DC

## 2021-09-24 MED ORDER — GUAIFENESIN 100 MG/5ML PO LIQD: 10.0000 mL | ORAL | 0 refills | Status: DC | PRN

## 2021-09-24 MED ORDER — VITAMIN D (ERGOCALCIFEROL) 1.25 MG (50000 UNIT) PO CAPS: 50000.0000 [IU] | ORAL_CAPSULE | ORAL | 0 refills | Status: DC

## 2021-09-24 MED ORDER — MIRTAZAPINE 15 MG PO TABS: 15.0000 mg | ORAL_TABLET | Freq: Every day | ORAL | 0 refills | Status: DC

## 2021-09-24 MED ORDER — FLUTICASONE PROPIONATE HFA 110 MCG/ACT IN AERO: 1.0000 | INHALATION_SPRAY | Freq: Two times a day (BID) | RESPIRATORY_TRACT | 0 refills | Status: DC

## 2021-09-24 MED ORDER — PANTOPRAZOLE SODIUM 40 MG PO TBEC: 40.0000 mg | DELAYED_RELEASE_TABLET | Freq: Every morning | ORAL | 0 refills | Status: DC

## 2021-09-24 MED ORDER — THIAMINE HCL 100 MG PO TABS: 100.0000 mg | ORAL_TABLET | Freq: Every day | ORAL | 0 refills | Status: DC

## 2021-09-24 MED ORDER — VITAMIN B-12 1000 MCG PO TABS: 1000.0000 ug | ORAL_TABLET | Freq: Every day | ORAL | 0 refills | Status: DC

## 2021-09-24 MED ORDER — LAMOTRIGINE 25 MG PO TABS: 25.0000 mg | ORAL_TABLET | Freq: Every day | ORAL | 0 refills | Status: DC

## 2021-09-24 MED ORDER — SIMVASTATIN 40 MG PO TABS: 40.0000 mg | ORAL_TABLET | Freq: Every day | ORAL | 0 refills | Status: DC

## 2021-09-24 MED ORDER — HYDROXYZINE HCL 25 MG PO TABS
25.0000 mg | ORAL_TABLET | Freq: Three times a day (TID) | ORAL | 0 refills | Status: DC | PRN
Start: 2021-09-24 — End: 2021-09-25

## 2021-09-24 MED ORDER — LORATADINE 10 MG PO TABS: 10.0000 mg | ORAL_TABLET | Freq: Every day | ORAL | 0 refills | Status: DC

## 2021-09-24 MED ORDER — SALINE SPRAY 0.65 % NA SOLN: 1.0000 | NASAL | 0 refills | Status: DC | PRN

## 2021-09-24 MED ORDER — GABAPENTIN 100 MG PO CAPS: 100.0000 mg | ORAL_CAPSULE | Freq: Three times a day (TID) | ORAL | 0 refills | Status: DC

## 2021-09-24 NOTE — BH IP Treatment Plan (Signed)
Interdisciplinary Treatment and Diagnostic Plan Update  09/24/2021 Time of Session: 10:40am Denise Knight MRN: [id number]  Principal Diagnosis: Bipolar II disorder, most recent episode major depressive ([place])  Secondary Diagnoses: Principal Problem:   Bipolar II disorder, most recent episode major depressive ([place]) Active Problems:   Cocaine abuse ([place])   Primary hypertension   Asthma exacerbation, mild   Current Medications:  Current Facility-Administered Medications  Medication Dose Route Frequency Provider Last Rate Last Admin   acetaminophen (TYLENOL) tablet 325 mg  325 mg Oral Q6H PRN Doda, Vandana, MD       albuterol (PROVENTIL) (2.5 MG/3ML) 0.083% nebulizer solution 2.5 mg  2.5 mg Inhalation Q6H PRN Suella Broad, FNP   2.5 mg at 09/19/21 0934   albuterol (VENTOLIN HFA) 108 (90 Base) MCG/ACT inhaler 1-2 puff  1-2 puff Inhalation Q6H PRN Nelda Marseille, Amy E, MD       alum & mag hydroxide-simeth (MAALOX/MYLANTA) 200-200-20 MG/5ML suspension 30 mL  30 mL Oral Q4H PRN Suella Broad, FNP   30 mL at 09/22/21 1634   amLODipine (NORVASC) tablet 10 mg  10 mg Oral Daily Suella Broad, FNP   10 mg at 09/24/21 0941   bisacodyl (DULCOLAX) EC tablet 10 mg  10 mg Oral Daily PRN Armando Reichert, MD       fluticasone (FLOVENT HFA) 110 MCG/ACT inhaler 1 puff  1 puff Inhalation BID Harlow Asa, MD   1 puff at 62/83/66 2947   folic acid (FOLVITE) tablet 1 mg  1 mg Oral Daily Doda, Vandana, MD   1 mg at 09/24/21 0942   gabapentin (NEURONTIN) capsule 100 mg  100 mg Oral TID Armando Reichert, MD   100 mg at 09/24/21 0942   guaiFENesin (ROBITUSSIN) 100 MG/5ML liquid 10 mL  10 mL Oral Q4H PRN Nelda Marseille, Amy E, MD   10 mL at 09/21/21 1721   hydrOXYzine (ATARAX/VISTARIL) tablet 25 mg  25 mg Oral TID PRN Armando Reichert, MD   25 mg at 09/24/21 0946   lamoTRIgine (LAMICTAL) tablet 25 mg  25 mg Oral Daily Suella Broad, FNP   25 mg at 09/24/21 6546   loperamide (IMODIUM)  capsule 2-4 mg  2-4 mg Oral PRN Harlow Asa, MD       loratadine (CLARITIN) tablet 10 mg  10 mg Oral Daily Kyle, Tyrone A, DO   10 mg at 09/24/21 0943   magnesium hydroxide (MILK OF MAGNESIA) suspension 30 mL  30 mL Oral Daily PRN Suella Broad, FNP   30 mL at 09/21/21 2109   melatonin tablet 10 mg  10 mg Oral QHS Armando Reichert, MD   10 mg at 09/23/21 2106   mirtazapine (REMERON) tablet 15 mg  15 mg Oral QHS Suella Broad, FNP   15 mg at 09/23/21 2106   multivitamin with minerals tablet 1 tablet  1 tablet Oral Daily Harlow Asa, MD   1 tablet at 09/24/21 0941   mupirocin ointment (BACTROBAN) 2 %   Nasal BID Harlow Asa, MD   1 application at 50/35/46 0944   nicotine polacrilex (NICORETTE) gum 2 mg  2 mg Oral PRN Harlow Asa, MD       ondansetron (ZOFRAN-ODT) disintegrating tablet 4 mg  4 mg Oral Q8H PRN Ethelene Hal, NP   4 mg at 09/22/21 1806   ondansetron (ZOFRAN-ODT) disintegrating tablet 4 mg  4 mg Oral Q6H PRN Harlow Asa, MD  pantoprazole (PROTONIX) EC tablet 40 mg  40 mg Oral q morning Rosita Kea, Vandana, MD   40 mg at 09/24/21 0941   polyethylene glycol (MIRALAX / GLYCOLAX) packet 17 g  17 g Oral Daily Armando Reichert, MD   17 g at 09/24/21 0943   QUEtiapine (SEROQUEL) tablet 150 mg  150 mg Oral QHS Doda, Edd Arbour, MD   150 mg at 09/23/21 2106   simvastatin (ZOCOR) tablet 40 mg  40 mg Oral Q supper Suella Broad, FNP   40 mg at 09/23/21 1724   sodium chloride (OCEAN) 0.65 % nasal spray 1 spray  1 spray Each Nare PRN Suella Broad, FNP       thiamine tablet 100 mg  100 mg Oral Daily Nelda Marseille, Amy E, MD   100 mg at 09/24/21 0941   traZODone (DESYREL) tablet 50 mg  50 mg Oral QHS PRN Armando Reichert, MD       vitamin B-12 (CYANOCOBALAMIN) tablet 1,000 mcg  1,000 mcg Oral Daily Suella Broad, FNP   1,000 mcg at 09/24/21 0941   Vitamin D (Ergocalciferol) (DRISDOL) capsule 50,000 Units  50,000 Units Oral Q7 days Harlow Asa, MD   50,000 Units at 09/19/21 1824   PTA Medications: Medications Prior to Admission  Medication Sig Dispense Refill Last Dose   albuterol (VENTOLIN HFA) 108 (90 Base) MCG/ACT inhaler Inhale 1-2 puffs into the lungs every 6 (six) hours as needed for wheezing or shortness of breath. 1 each 0    amLODipine (NORVASC) 10 MG tablet Take 10 mg by mouth every morning.      budesonide (PULMICORT) 0.25 MG/2ML nebulizer solution Take 2 mLs (0.25 mg total) by nebulization 2 (two) times daily. (Patient not taking: No sig reported) 120 mL 0     Patient Stressors: Financial difficulties   Marital or family conflict    Patient Strengths: Motivation for treatment/growth   Treatment Modalities: Medication Management, Group therapy, Case management,  1 to 1 session with clinician, Psychoeducation, Recreational therapy.   Physician Treatment Plan for Primary Diagnosis: Bipolar II disorder, most recent episode major depressive ([place]) Long Term Goal(s): Improvement in symptoms so as ready for discharge   Short Term Goals: Ability to identify changes in lifestyle to reduce recurrence of condition will improve Ability to verbalize feelings will improve Ability to disclose and discuss suicidal ideas Ability to demonstrate self-control will improve Ability to identify and develop effective coping behaviors will improve Ability to maintain clinical measurements within normal limits will improve Compliance with prescribed medications will improve Ability to identify triggers associated with substance abuse/mental health issues will improve  Medication Management: Evaluate patient's response, side effects, and tolerance of medication regimen.  Therapeutic Interventions: 1 to 1 sessions, Unit Group sessions and Medication administration.  Evaluation of Outcomes: Progressing  Physician Treatment Plan for Secondary Diagnosis: Principal Problem:   Bipolar II disorder, most recent episode major depressive  ([place]) Active Problems:   Cocaine abuse ([place])   Primary hypertension   Asthma exacerbation, mild  Long Term Goal(s): Improvement in symptoms so as ready for discharge   Short Term Goals: Ability to identify changes in lifestyle to reduce recurrence of condition will improve Ability to verbalize feelings will improve Ability to disclose and discuss suicidal ideas Ability to demonstrate self-control will improve Ability to identify and develop effective coping behaviors will improve Ability to maintain clinical measurements within normal limits will improve Compliance with prescribed medications will improve Ability to identify triggers associated with substance abuse/mental  health issues will improve     Medication Management: Evaluate patient's response, side effects, and tolerance of medication regimen.  Therapeutic Interventions: 1 to 1 sessions, Unit Group sessions and Medication administration.  Evaluation of Outcomes: Progressing   RN Treatment Plan for Primary Diagnosis: Bipolar II disorder, most recent episode major depressive ([place]) Long Term Goal(s): Knowledge of disease and therapeutic regimen to maintain health will improve  Short Term Goals: Ability to remain free from injury will improve, Ability to verbalize frustration and anger appropriately will improve, Ability to demonstrate self-control, Ability to identify and develop effective coping behaviors will improve, and Compliance with prescribed medications will improve  Medication Management: RN will administer medications as ordered by provider, will assess and evaluate patient's response and provide education to patient for prescribed medication. RN will report any adverse and/or side effects to prescribing provider.  Therapeutic Interventions: 1 on 1 counseling sessions, Psychoeducation, Medication administration, Evaluate responses to treatment, Monitor vital signs and CBGs as ordered, Perform/monitor CIWA, COWS, AIMS  and Fall Risk screenings as ordered, Perform wound care treatments as ordered.  Evaluation of Outcomes: Progressing   LCSW Treatment Plan for Primary Diagnosis: Bipolar II disorder, most recent episode major depressive ([place]) Long Term Goal(s): Safe transition to appropriate next level of care at discharge, Engage patient in therapeutic group addressing interpersonal concerns.  Short Term Goals: Engage patient in aftercare planning with referrals and resources, Increase social support, Increase ability to appropriately verbalize feelings, Identify triggers associated with mental health/substance abuse issues, and Increase skills for wellness and recovery  Therapeutic Interventions: Assess for all discharge needs, 1 to 1 time with Social worker, Explore available resources and support systems, Assess for adequacy in community support network, Educate family and significant other(s) on suicide prevention, Complete Psychosocial Assessment, Interpersonal group therapy.  Evaluation of Outcomes: Progressing   Progress in Treatment: Attending groups: Yes. Participating in groups: Yes. Taking medication as prescribed: Yes. Toleration medication: Yes. Family/Significant other contact made: Yes, individual(s) contacted:  Daughter  Patient understands diagnosis: Yes. Discussing patient identified problems/goals with staff: Yes. Medical problems stabilized or resolved: Yes. Denies suicidal/homicidal ideation: Yes. Issues/concerns per patient self-inventory: No.     New problem(s) identified: No, Describe:  None    New Short Term/Long Term Goal(s): medication stabilization, elimination of SI thoughts, development of comprehensive mental wellness plan.    Patient Goals: "To get better and to get off drugs"    Discharge Plan or Barriers: Patient is to discharge to [place]   Reason for Continuation of Hospitalization: Depression Medication stabilization    Estimated Length of Stay: 1- 3 days     Scribe for Treatment Team: Vassie Moselle, LCSW 09/24/2021 11:27 AM

## 2021-09-24 NOTE — Progress Notes (Signed)
CSW met with patient and patient reported that she called [facility] and they would not be able to reserve bed until she confirmed train transportation to get to residence.    CSW provided options of being able to send patient via safe transport to [facility] so that we can reserve the bed.  Patient reports that she needs to get her belongings out of a safe prior to going to [place].  Patient reports that she has a friend that is willing to pick her up and take her to storage unit and she can pay for a train ticket but doesn't have access to her debit card to reserve train ticket.   CSW spoke with patient provider, Viann Fish, and she informed needing a safe discharge before discharging patient.  Dr. Nelda Marseille agreed to put in order for someone to provide access to patient for debit card to assist with purchasing train ticket.  Patient will call after train ticket is purchased to reserve bed for discharge tomorrow.    Darious Rehman, LCSW, [place] Social Worker  [facility]

## 2021-09-24 NOTE — Group Note (Signed)
Recreation Therapy Group Note   Group Topic:Stress Management  Group Date: 09/24/2021 Start Time: 0930 End Time: 0945 Facilitators: Victorino Sparrow, [place] Location: [address] Dayroom   Goal Area(s) Addresses:  Patient will identify positive stress management techniques. Patient will identify benefits of using stress management post d/c.  Group Description:  Meditation.  LRT played a meditation that focused on self trust and how we sometimes doubt ourselves due to past mistakes and what it takes to regain that confidence/trust in ones self.  Patients were to listen, follow along and let their breathing relax each person as much as possible.   Affect/Mood: Appropriate   Participation Level: Minimal   Participation Quality: Independent   Behavior: Attentive    Speech/Thought Process: Focused   Insight: Moderate   Judgement: Moderate   Modes of Intervention: Meditation   Patient Response to Interventions:  Attentive   Education Outcome:  Acknowledges education and In group clarification offered    Clinical Observations/Individualized Feedback: Pt sat quietly at the table doing a coloring sheet during group.  Pt appeared to be  attentive to the meditation as it played.  Pt left early to get medication and did not return.    Plan: Continue to engage patient in RT group sessions 2-3x/week.   Victorino Sparrow, LRT,CTRS 09/24/2021 12:01 PM

## 2021-09-24 NOTE — Progress Notes (Signed)
Pt remains somatic this shift. Preoccupied about being constipated "I had just a little bit of BM, I'm still constipated". Pt was assisted by staff to get her debit card to order her train ticket to [facility] [place]. Visible in scheduled groups, engaged in scheduled activities. Received PRN Vistaril for anxiety at 0946 6/10. Reports relief when reassessed. Remains medications compliant without discomfort. Support and encouragement offered. All medications given with verbal education and effects monitored. Q 15 minutes safety checks maintained on and off unit. Writer encouraged pt to verbalize concerns. EKG done and placed on chart.   Pt remains safe on and off unit.

## 2021-09-24 NOTE — Progress Notes (Signed)
Denise Knight comes back in the dayroom wearing a dress. The dress very sheer you can see thru the dress her undergarments are visible. Writer asked [name] she needs to cover up her sheer dress. [name] said her garment house coat is in the washer. Staff offered a hospital gown to put on. [name] snatch the gown and put it around her back. [name] appeared to be angry.[name] continue to talk on the phone. RN will be notify

## 2021-09-24 NOTE — Progress Notes (Signed)
Patient did not attend wrap yup group.

## 2021-09-24 NOTE — Group Note (Signed)
Occupational Therapy Group Note  Group Topic:Feelings Management  Group Date: 09/24/2021 Start Time: 1400 End Time: 1500 Facilitators: Ponciano Ort, OT/L    Group Description: Group encouraged increased engagement and participation through discussion focused on Building Happiness. Patients were provided a handout and reviewed therapeutic strategies to build happiness including identifying gratitudes, random acts of kindness, exercise, meditation, positive journaling, and fostering relationships. Patients engaged in discussion and encouraged to reflect on each strategy and their experiences.  Therapeutic Goal(s): Identify strategies to build happiness. Identify and implement therapeutic strategies to improve overall mood. Practice and identify gratitudes, random acts of kindness, exercise, meditation, positive journaling, and fostering relationships     Participation Level: Active   Participation Quality: Independent   Behavior: Cooperative and Interactive    Speech/Thought Process: Focused   Affect/Mood: Full range   Insight: Fair   Judgement: Fair   Individualization: Denise Knight was active in their participation of group discussion/activity. Pt identified "coloring and acting a fool" as something that brings them happiness. Appeared attentive and engaged in discussion.   Modes of Intervention: Activity, Discussion, and Education  Patient Response to Interventions:  Attentive, Engaged, and Receptive   Plan: Continue to engage patient in OT groups 2 - 3x/week.  09/24/2021  Ponciano Ort, OT/L

## 2021-09-24 NOTE — Progress Notes (Signed)
   09/23/21 2106  Psych Admission Type (Psych Patients Only)  Admission Status Voluntary  Psychosocial Assessment  Patient Complaints Depression;Sadness  Eye Contact Fair  Facial Expression Animated  Affect Appropriate to circumstance  Speech Logical/coherent  Interaction Assertive;Attention-seeking  Motor Activity Slow  Appearance/Hygiene Unremarkable  Behavior Characteristics Cooperative;Appropriate to situation  Mood Depressed  Thought Process  Coherency WDL  Content WDL  Delusions None reported or observed  Perception WDL  Hallucination None reported or observed  Judgment Poor  Confusion None  Danger to Self  Current suicidal ideation? Denies  Self-Injurious Behavior No self-injurious ideation or behavior indicators observed or expressed   Agreement Not to Harm Self Yes  Description of Agreement Verbal contract  Danger to Others  Danger to Others None reported or observed

## 2021-09-24 NOTE — Group Note (Addendum)
LCSW Group Therapy Note   Group Date: 09/24/2021 Start Time: 1300 End Time: 1400  Therapy Type: Group Therapy  Participation Level:  None   Patients received a worksheet with an outline of 2 gingerbread men with a separation in the middle of the page. One sign designated what the pt sees about themselves and the other is what others see. Pts were asked to introduce themselves and share something they like about themself. Pts were then asked to draw, write or color how they view themselves as well as how they are viewed by others. CSW led discussion about the feelings and words associated with each side.   Patient Summary:   Pt came into group late and missed introductions. Pt came in and started to work on her worksheet and then left again. Pt later came back to group but did not complete the worksheet provided and instead colored her personal coloring sheets and did not participate in discussion.   Denise Knight, LCSWA 09/24/2021  1:38 PM

## 2021-09-24 NOTE — Discharge Summary (Addendum)
Physician Discharge Summary Note  Patient:  Denise Knight is an 55 y.o., female MRN:  [id number] DOB:  15-Dec-1965 Patient phone:  [phone] (home)  Patient address:   [address] [place] [address],  Total Time spent with patient: 30 minutes  Date of Admission:  09/18/2021 Date of Discharge: 09/25/2021  Reason for Admission: [name] is a 55 year old female with reported PPHx of depression, bipolar, PTSD who was admitted to [facility] from [place] ED due to suicide attempt with 32 pills of Tylenol PM.  Principal Problem: Bipolar II disorder, most recent episode major depressive [facility]) Discharge Diagnoses: Principal Problem:   Bipolar II disorder, most recent episode major depressive ([place]) Active Problems:   Cocaine abuse ([place])   Primary hypertension   Asthma exacerbation, mild   Past Psychiatric History: Depression, bipolar, PTSD; Suicide attempt via overdose on crack and Advil PM with [facility] admission October 23-Sep 14, 2021;History of multiple suicide attempts by overdose, most of her lethality that resulted in medical admission to the hospital.  She has previously taken multiple psychotropic medications to include lithium, Seroquel, Prozac.  She was recently discharged on Lamictal 25 mg p.o. daily, mirtazapine 15 mg p.o. nightly, risperidone 2 mg p.o. nightly.  Past Medical History:  Past Medical History:  Diagnosis Date   Allergy    seasonal   Anxiety    Arthritis    "left knee" (07/05/2016)   Asthma    Bipolar 1 disorder ([place])    Depression    Diabetes mellitus without complication (HCC)    pre-   GERD (gastroesophageal reflux disease)    Hypertension    MI (mitral incompetence)    Post traumatic stress disorder    Schizophrenia ([place])    Stomach ulcer    Substance abuse ([place])    cocaine quit july 2020   Suicide attempt [facility])     Past Surgical History:  Procedure Laterality Date   CARDIAC CATHETERIZATION  07/05/2016   CARDIAC  CATHETERIZATION N/A 07/05/2016   Procedure: Left Heart Cath and Coronary Angiography;  Surgeon: Adrian Prows, MD;  Location: [place] [facility];  Service: Cardiovascular;  Laterality: N/A;   CYST EXCISION Right    "wrist"   DILATION AND CURETTAGE OF UTERUS     FOOT SURGERY Bilateral    "corns removed"   LEFT HEART CATH AND CORONARY ANGIOGRAPHY N/A 12/24/2017   Procedure: LEFT HEART CATH AND CORONARY ANGIOGRAPHY;  Surgeon: Nigel Mormon, MD;  Location: [place] [facility];  Service: Cardiovascular;  Laterality: N/A;   TUBAL LIGATION     Family History:  Family History  Problem Relation Age of Onset   Breast cancer Other    Lung cancer Other    Congestive Heart Failure Other    Diabetes Other    Hypertension Other    Colon cancer Neg Hx    Colon polyps Neg Hx    Esophageal cancer Neg Hx    Stomach cancer Neg Hx    Rectal cancer Neg Hx    Family Psychiatric  History: Cousin completed suicide at 61. Social History:  Social History   Substance and Sexual Activity  Alcohol Use Yes   Alcohol/week: 7.0 standard drinks   Types: 7 Cans of beer per week     Social History   Substance and Sexual Activity  Drug Use Yes   Types: Marijuana    Social History   Socioeconomic History   Marital status: Widowed  Spouse name: Not on file   Number of children: 2   Years of education: Not on file   Highest education level: Not on file  Occupational History   Occupation: disabled   Tobacco Use   Smoking status: Some Days    Packs/day: 0.25    Years: 35.00    Pack years: 8.75    Types: Cigarettes    Last attempt to quit: 05/30/2019    Years since quitting: 2.3   Smokeless tobacco: Never  Vaping Use   Vaping Use: Never used  Substance and Sexual Activity   Alcohol use: Yes    Alcohol/week: 7.0 standard drinks    Types: 7 Cans of beer per week   Drug use: Yes    Types: Marijuana   Sexual activity: Yes    Birth control/protection: Surgical  Other Topics Concern   Not on  file  Social History Narrative   Not on file   Social Determinants of Health   Financial Resource Strain: Not on file  Food Insecurity: Not on file  Transportation Needs: Not on file  Physical Activity: Not on file  Stress: Not on file  Social Connections: Not on file    Hospital Course:  On admission, patient was integrated into therapeutic milieu and placed on appropriate precautions.  Pt was restarted on her home medication Remeron 15 mg at bedtime, Lamictal 25 mg daily, risperidone 2 mg nightly, melatonin 5 mg at bedtime.  Her home amantadine was stopped.  Risperidone was switched to Seroquel titrated to 150 mg nightly.  Patient was put on CIWA protocol with as needed Ativan for alcohol withdrawal. She was started on thiamine, multivitamin, folic acid and as needed medication to control her withdrawal symptoms.  Patient was started on gabapentin 100 mg 3 times daily for alcohol cravings.  Patient reported worsening of her asthma with shortness of breath and cough.  Chest x-ray was negative.  On call hospitalist was consulted and recommended 5-day course of prednisone 40 mg daily, continuing albuterol MDI and nebs and Flovent inhaler, Robitussin for cough, and loratadine 10 mg daily.  Pulmicort was stopped.   Pt's mood, sleep, affect and anxiety improved while her time at the inpatient unit. Pt was seen daily on rounds, and case discussed at multidisciplinary team meeting to ensure biopsychosocial approach to treatment. Pt was educated on need for compliance with his medications. Pt was also educated on need to abstain from use of any drugs. During her stay Patient did not display any dangerous, violent or suicidal behavior on the unit.  Pt interacted with patients & staff appropriately, participated appropriately in the group sessions/therapies. Pt's medications were addressed & adjusted to meet her needs. Pt was recommended for outpatient follow-up care & medication management upon discharge to  assure her continuity of care.  Patient was given resources for alcohol rehab places.  Patient got accepted to sober living in [place]. At the time of discharge, patient is not reporting any acute suicidal/homicidal ideations. Pt reports improved mood, sleep and anxiety. Pt currently denies any new issues or concerns. Education and supportive counseling provided throughout her hospital stay & upon discharge.  Safety planning done with patient.  Patient was discharged to sober living in [place].  Patient booked a train ticket to sober living in [place].  Patient was picked up by her friend. Patient advised to go directly to [place] and to abstain from illicit drug and alcohol use. She was encouraged to keep scheduled outpatient mental  health follow up appointments and to comply with medications. She is advised that she will need ongoing monitoring of her lipids, glucose, CBC, AIMS, weight, and EKG while on Seroquel and will need to monitor for development of rash/blisters (signs of Steven's Johnson Syndrome) while on Lamictal. She was advised to see a primary care provider or the health department without fail for management of her asthma, hypertension, and low Vitamin B12 and Vitamin D.   Please see discharge medication section for medication recommendations at discharge.  Labs- CMP-sodium 138, potassium 3.6, creatinine 1.02, AST 19, ALT 48 Glucose 86 Influenza A, B, COVID-negative EKG -normal sinus rhythm QTC 467 Acetaminophen level on 11/6-201 trended down to less than 10 Pregnancy test negative Salicylate level less than 7 Ethanol level less than 10 UDS positive for cocaine HIV negative Last lipid panel on 10/24-cholesterol 230, LDL 146, triglyceride 111, HDL 62 HbA1c on 10/24-5.7 TSH on 10/24-5.075 and T3- 2.2, T4- 0.7  CBC (11/9) - hemoglobin 11.4 CMP (11/13) - creatinine improved to 0.99  Physical Findings: AIMS: Facial and Oral Movements Muscles of Facial Expression:  None, normal Lips and Perioral Area: None, normal Jaw: None, normal Tongue: None, normal,Extremity Movements Upper (arms, wrists, hands, fingers): None, normal Lower (legs, knees, ankles, toes): None, normal, Trunk Movements Neck, shoulders, hips: None, normal, Overall Severity Severity of abnormal movements (highest score from questions above): None, normal Incapacitation due to abnormal movements: None, normal Patient's awareness of abnormal movements (rate only patient's report): No Awareness, Dental Status Current problems with teeth and/or dentures?: No Does patient usually wear dentures?: No  CIWA:  CIWA-Ar Total: 0 COWS:     Musculoskeletal: Strength & Muscle Tone: within normal limits Gait & Station: normal Patient leans: N/A   Psychiatric Specialty Exam:  Presentation  General Appearance: Appropriate for Environment; Casual  Eye Contact:Fair  Speech:Clear and Coherent  Speech Volume:Normal  Handedness:Right   Mood and Affect  Mood:Anxious; Dysphoric  Affect:Constricted   Thought Process  Thought Processes:Coherent; Linear  Descriptions of Associations:Intact  Orientation:Full (Time, Place and Person)  Thought Content:Logical  History of Schizophrenia/Schizoaffective disorder:No  Duration of Psychotic Symptoms:No data recorded Hallucinations: none  Ideas of Reference:None  Suicidal Thoughts: No  Homicidal Thoughts:No   Sensorium  Memory:Immediate Fair; Recent Fair; Remote Fair  Judgment:Fair  Insight:Fair   Executive Functions  Concentration:Fair  Attention Span:Fair  [place]   Psychomotor Activity  Psychomotor Activity:Normal   Assets  Assets:Communication Skills; Desire for Improvement; Financial Resources/Insurance; Housing; Social Support   Sleep  Sleep:No data recorded    Physical Exam: Physical Exam Vitals and nursing note reviewed.  Constitutional:      General:  She is not in acute distress.    Appearance: Normal appearance. She is not ill-appearing, toxic-appearing or diaphoretic.  HENT:     Head: Normocephalic and atraumatic.  Pulmonary:     Effort: Pulmonary effort is normal.  Neurological:     General: No focal deficit present.     Mental Status: She is alert and oriented to person, place, and time.   Review of Systems  Constitutional:  Negative for chills and fever.  HENT:  Negative for hearing loss.   Eyes:  Negative for blurred vision.  Respiratory:  Negative for shortness of breath.        Cough improved  Cardiovascular:  Negative for chest pain.  Gastrointestinal:  Negative for abdominal pain, constipation, diarrhea, nausea and vomiting.  Neurological:  Negative for dizziness.  Blood pressure 104/76,  pulse (!) 106, temperature 98.2 F (36.8 C), temperature source Oral, resp. rate 20, height 5' 8"  (1.727 m), weight 88.5 kg, SpO2 100 %. Body mass index is 29.67 kg/m.   Social History   Tobacco Use  Smoking Status Some Days   Packs/day: 0.25   Years: 35.00   Pack years: 8.75   Types: Cigarettes   Last attempt to quit: 05/30/2019   Years since quitting: 2.3  Smokeless Tobacco Never   Tobacco Cessation:  A prescription for an FDA-approved tobacco cessation medication provided at discharge   Blood Alcohol level:  Lab Results  Component Value Date   [facility] <10 09/16/2021   ETH <10 09/98/3382    Metabolic Disorder Labs:  Lab Results  Component Value Date   HGBA1C 5.7 (H) 09/03/2021   MPG 116.89 09/03/2021   MPG 116.89 01/13/2020   No results found for: PROLACTIN Lab Results  Component Value Date   CHOL 230 (H) 09/03/2021   TRIG 111 09/03/2021   HDL 62 09/03/2021   CHOLHDL 3.7 09/03/2021   VLDL 22 09/03/2021   LDLCALC 146 (H) 09/03/2021   LDLCALC 114 (H) 01/13/2020    See Psychiatric Specialty Exam and Suicide Risk Assessment completed by Attending Physician prior to discharge.  Discharge destination:  Other:   Sober Living   Is patient on multiple antipsychotic therapies at discharge:  No   Has Patient had three or more failed trials of antipsychotic monotherapy by history:  No  Recommended Plan for Multiple Antipsychotic Therapies: NA  Discharge Instructions     Diet - low sodium heart healthy   Complete by: As directed    Increase activity slowly   Complete by: As directed       Allergies as of 09/25/2021       Reactions   Aspirin Hives   Food Hives   "Regular butter"   Morphine And Related Hives        Medication List     STOP taking these medications    budesonide 0.25 MG/2ML nebulizer solution Commonly known as: PULMICORT       TAKE these medications      Indication  albuterol 108 (90 Base) MCG/ACT inhaler Commonly known as: VENTOLIN HFA Inhale 1-2 puffs into the lungs every 6 (six) hours as needed for wheezing or shortness of breath.  Indication: Asthma   amLODipine 10 MG tablet Commonly known as: NORVASC Take 10 mg by mouth every morning.  Indication: High Blood Pressure Disorder   bisacodyl 5 MG EC tablet Commonly known as: DULCOLAX Take 2 tablets (10 mg total) by mouth daily as needed for moderate constipation.  Indication: Constipation   fluticasone 110 MCG/ACT inhaler Commonly known as: FLOVENT HFA Inhale 1 puff into the lungs 2 (two) times daily.  Indication: Asthma   folic acid 1 MG tablet Commonly known as: FOLVITE Take 1 tablet (1 mg total) by mouth daily.  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  Indication: Abuse or Misuse of Alcohol   guaiFENesin 100 MG/5ML liquid Commonly known as: ROBITUSSIN Take 10 mLs by mouth every 4 (four) hours as needed for to loosen phlegm.  Indication: Cough   hydrOXYzine 25 MG tablet Commonly known as: ATARAX/VISTARIL Take 1 tablet (25 mg total) by mouth 3 (three) times  daily as needed for anxiety.  Indication: Feeling Anxious   lamoTRIgine 25 MG tablet  Commonly known as: LAMICTAL Take 1 tablet (25 mg total) by mouth daily.  Indication: Depressive Phase of Manic-Depression   loratadine 10 MG tablet Commonly known as: CLARITIN Take 1 tablet (10 mg total) by mouth daily.  Indication: Upper Respiratory Tract Allergy   Melatonin 10 MG Tabs Take 10 mg by mouth at bedtime.  Indication: Trouble Sleeping   mirtazapine 15 MG tablet Commonly known as: REMERON Take 1 tablet (15 mg total) by mouth at bedtime.  Indication: Major Depressive Disorder   multivitamin with minerals Tabs tablet Take 1 tablet by mouth daily.  Indication: Tiredness   mupirocin ointment 2 % Commonly known as: BACTROBAN Place into the nose 2 (two) times daily. Apply in Nose 2 times daily until 09/27/21. Start taking on: September 27, 2021  Indication: MRSA nasal infection.   nicotine polacrilex 2 MG gum Commonly known as: NICORETTE Take 1 each (2 mg total) by mouth as needed for smoking cessation.  Indication: Nicotine Addiction   pantoprazole 40 MG tablet Commonly known as: PROTONIX Take 1 tablet (40 mg total) by mouth every morning.  Indication: Heartburn   QUEtiapine Fumarate 150 MG Tabs Take 150 mg by mouth at bedtime.  Indication: Major Depressive Disorder   simvastatin 40 MG tablet Commonly known as: ZOCOR Take 1 tablet (40 mg total) by mouth daily with supper.  Indication: High Amount of Fats in the Blood   sodium chloride 0.65 % Soln nasal spray Commonly known as: OCEAN Place 1 spray into both nostrils as needed for congestion.  Indication: congestion   thiamine 100 MG tablet Take 1 tablet (100 mg total) by mouth daily.  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  Indication: Inadequate Vitamin B12   Vitamin D (Ergocalciferol)  1.25 MG (50000 UNIT) Caps capsule Commonly known as: DRISDOL Take 1 capsule (50,000 Units total) by mouth every 7 (seven) days.  Indication: Vitamin D Deficiency        Follow-up Information     Medicine, [facility].   Specialty: Family Medicine Why: You have an appointment on 09/26/2021 at 11:15pm for medication management and primary care services Contact information: [place] [address] [phone]         [facility]. Go to.   Why: you have been accepted for a [facility]'s bed at [facility] in [place] area.  Please take train to meet with representative so that they can take you to location. Contact information: [phone]        [facility]. Go to.   Why: Please go to walk in hours for follow up with med management and therapy services. Monday through Friday from 8am-3pm.  This will be a 2 hour intake appointment. Contact information: [address], [place], [place] [address] [phone]                Follow-up recommendations:  Activity:  As Tolerated Diet:  Regular  Comments:  Paper Prescriptions given at discharge. Patient agreeable to plan.  Given opportunity to ask questions. Appears to feel comfortable with discharge denies any current suicidal or homicidal thought. Patient is also instructed prior to discharge to: Take all medications as prescribed by her mental healthcare provider. Report any adverse effects and or reactions from the medicines to her outpatient provider promptly. Patient has been instructed & cautioned: To not  engage in alcohol and or illegal drug use while on prescription medicines. In the event of worsening symptoms, patient is instructed to call the crisis hotline, 911 and or go to the nearest ED for appropriate evaluation and treatment of symptoms. To follow-up with her primary care provider for your other medical issues, concerns and or health care needs.    Signed: Armando Reichert, MD PGY2 09/26/2021, 3:39 PM

## 2021-09-24 NOTE — Progress Notes (Addendum)
Denise Knight  09/24/2021 1:03 PM Denise Knight  MRN:  [id number]  Subjective: In short: [name] is a 55 year old female with reported PPHx of depression, bipolar, PTSD who was admitted to [facility] from [place] ED due to suicide attempt with 32 pills of Tylenol PM.   Chart Review from last 24 hours:  The patient's chart was reviewed and nursing notes were reviewed. The patient's case was discussed in multidisciplinary team meeting. Per nursing, patient had no acute safety or behavior concerns and attended some groups. Per MAR she was compliant with her scheduled medications except thiamine yesterday. She received as needed Vistaril yesterday and today.  Daily notes: Denise Knight is seen today in her room in the presence of Dr. Nelda Marseille.  She is lying on the bed.  She presents alert, oriented & aware of situation.  Patient states her mood is "good" and reports improvement in her depression and anxiety.  She complains of abdominal pain.  She states the nurse did not give her Protonix today as it is scheduled for 10:00 am. She states she had small bowel movement yesterday after the enema.  She also complains of some rib pain.  Discussed that it may be due to her asthma for which she got steroids.  Currently, she denies any active or passive suicidal ideation, homicidal ideation, auditory and visual hallucinations. She denies any paranoia.  She states that she will call [facility] today to confirm her bed.  She denies any withdrawal symptoms.  She denies any alcohol cravings today.  She reports good appetite.  She states she found out that the train to [place] starts at 7:00 a.m. but she is planning to take 1 PM train.  She states her friend will pick her up and take her to storage unit to get clothes. She denies any shortness of breath and reports improvement in her cough. She denies any other concerns.  Later today CSW met with patient and patient reported that she  called [facility] and they would not be able to reserve bed until she confirmed her train transportation.  Per CSW, patient will access her debit card to purchase to  train ticket.  Patient will call [facility] after train ticket is purchased to reserve bed for discharge tomorrow.  1.29 pm- Pt made train reservations for tomorrow and will call [facility] to confirm her spot.   Principal Problem: Bipolar II disorder, most recent episode major depressive ([place])  Diagnosis: Principal Problem:   Bipolar II disorder, most recent episode major depressive ([place]) Active Problems:   Cocaine abuse ([place])   Primary hypertension   Asthma exacerbation, mild  Total Time Spent in Direct Patient Care:  I personally spent 30 minutes on the unit in direct patient care. The direct patient care time included face-to-face time with the patient, reviewing the patient's chart, communicating with other professionals, and coordinating care. Greater than 50% of this time was spent in counseling or coordinating care with the patient regarding goals of hospitalization, psycho-education, and discharge planning needs.  Past Psychiatric History: See H&P  Past Medical History:  Past Medical History:  Diagnosis Date   Allergy    seasonal   Anxiety    Arthritis    "left knee" (07/05/2016)   Asthma    Bipolar 1 disorder ([place])    Depression    Diabetes mellitus without complication (HCC)    pre-   GERD (gastroesophageal reflux disease)    Hypertension    MI (mitral  incompetence)    Post traumatic stress disorder    Schizophrenia ([place])    Stomach ulcer    Substance abuse ([place])    cocaine quit july 2020   Suicide attempt [facility])     Past Surgical History:  Procedure Laterality Date   CARDIAC CATHETERIZATION  07/05/2016   CARDIAC CATHETERIZATION N/A 07/05/2016   Procedure: Left Heart Cath and Coronary Angiography;  Surgeon: Adrian Prows, MD;  Location: [facility];  Service: Cardiovascular;  Laterality: N/A;   CYST EXCISION Right     "wrist"   DILATION AND CURETTAGE OF UTERUS     FOOT SURGERY Bilateral    "corns removed"   LEFT HEART CATH AND CORONARY ANGIOGRAPHY N/A 12/24/2017   Procedure: LEFT HEART CATH AND CORONARY ANGIOGRAPHY;  Surgeon: Nigel Mormon, MD;  Location: [facility];  Service: Cardiovascular;  Laterality: N/A;   TUBAL LIGATION     Family History:  Family History  Problem Relation Age of Onset   Breast cancer Other    Lung cancer Other    Congestive Heart Failure Other    Diabetes Other    Hypertension Other    Colon cancer Neg Hx    Colon polyps Neg Hx    Esophageal cancer Neg Hx    Stomach cancer Neg Hx    Rectal cancer Neg Hx    Family Psychiatric  History: See H&P Social History:  Social History   Substance and Sexual Activity  Alcohol Use Yes   Alcohol/week: 7.0 standard drinks   Types: 7 Cans of beer per week     Social History   Substance and Sexual Activity  Drug Use Yes   Types: Marijuana    Social History   Socioeconomic History   Marital status: Widowed    Spouse name: Not on file   Number of children: 2   Years of education: Not on file   Highest education level: Not on file  Occupational History   Occupation: disabled   Tobacco Use   Smoking status: Some Days    Packs/day: 0.25    Years: 35.00    Pack years: 8.75    Types: Cigarettes    Last attempt to quit: 05/30/2019    Years since quitting: 2.3   Smokeless tobacco: Never  Vaping Use   Vaping Use: Never used  Substance and Sexual Activity   Alcohol use: Yes    Alcohol/week: 7.0 standard drinks    Types: 7 Cans of beer per week   Drug use: Yes    Types: Marijuana   Sexual activity: Yes    Birth control/protection: Surgical  Other Topics Concern   Not on file  Social History Narrative   Not on file   Social Determinants of Health   Financial Resource Strain: Not on file  Food Insecurity: Not on file  Transportation Needs: Not on file  Physical Activity: Not on file  Stress:  Not on file  Social Connections: Not on file   Sleep: Fair woke up 3 times.  Appetite:  Good,  Current Medications: Current Facility-Administered Medications  Medication Dose Route Frequency Provider Last Rate Last Admin   acetaminophen (TYLENOL) tablet 325 mg  325 mg Oral Q6H PRN Doda, Vandana, MD       albuterol (PROVENTIL) (2.5 MG/3ML) 0.083% nebulizer solution 2.5 mg  2.5 mg Inhalation Q6H PRN Suella Broad, FNP   2.5 mg at 09/19/21 0934   albuterol (VENTOLIN HFA) 108 (90 Base) MCG/ACT inhaler 1-2  puff  1-2 puff Inhalation Q6H PRN Nelda Marseille, Darl Brisbin E, MD       alum & mag hydroxide-simeth (MAALOX/MYLANTA) 200-200-20 MG/5ML suspension 30 mL  30 mL Oral Q4H PRN Suella Broad, FNP   30 mL at 09/22/21 1634   amLODipine (NORVASC) tablet 10 mg  10 mg Oral Daily Suella Broad, FNP   10 mg at 09/24/21 0941   bisacodyl (DULCOLAX) EC tablet 10 mg  10 mg Oral Daily PRN Armando Reichert, MD       fluticasone (FLOVENT HFA) 110 MCG/ACT inhaler 1 puff  1 puff Inhalation BID Harlow Asa, MD   1 puff at 35/36/14 4315   folic acid (FOLVITE) tablet 1 mg  1 mg Oral Daily Armando Reichert, MD   1 mg at 09/24/21 0942   gabapentin (NEURONTIN) capsule 100 mg  100 mg Oral TID Armando Reichert, MD   100 mg at 09/24/21 1256   guaiFENesin (ROBITUSSIN) 100 MG/5ML liquid 10 mL  10 mL Oral Q4H PRN Viann Fish E, MD   10 mL at 09/21/21 1721   hydrOXYzine (ATARAX/VISTARIL) tablet 25 mg  25 mg Oral TID PRN Armando Reichert, MD   25 mg at 09/24/21 0946   lamoTRIgine (LAMICTAL) tablet 25 mg  25 mg Oral Daily Suella Broad, FNP   25 mg at 09/24/21 4008   loperamide (IMODIUM) capsule 2-4 mg  2-4 mg Oral PRN Harlow Asa, MD       loratadine (CLARITIN) tablet 10 mg  10 mg Oral Daily Kyle, Tyrone A, DO   10 mg at 09/24/21 0943   magnesium hydroxide (MILK OF MAGNESIA) suspension 30 mL  30 mL Oral Daily PRN Suella Broad, FNP   30 mL at 09/21/21 2109   melatonin tablet 10 mg  10 mg Oral  QHS Armando Reichert, MD   10 mg at 09/23/21 2106   mirtazapine (REMERON) tablet 15 mg  15 mg Oral QHS Suella Broad, FNP   15 mg at 09/23/21 2106   multivitamin with minerals tablet 1 tablet  1 tablet Oral Daily Harlow Asa, MD   1 tablet at 09/24/21 0941   mupirocin ointment (BACTROBAN) 2 %   Nasal BID Harlow Asa, MD   1 application at 67/61/95 0944   nicotine polacrilex (NICORETTE) gum 2 mg  2 mg Oral PRN Harlow Asa, MD       ondansetron (ZOFRAN-ODT) disintegrating tablet 4 mg  4 mg Oral Q8H PRN Ethelene Hal, NP   4 mg at 09/22/21 1806   ondansetron (ZOFRAN-ODT) disintegrating tablet 4 mg  4 mg Oral Q6H PRN Harlow Asa, MD       pantoprazole (PROTONIX) EC tablet 40 mg  40 mg Oral q morning Rosita Kea, Vandana, MD   40 mg at 09/24/21 0941   polyethylene glycol (MIRALAX / GLYCOLAX) packet 17 g  17 g Oral Daily Armando Reichert, MD   17 g at 09/24/21 0943   QUEtiapine (SEROQUEL) tablet 150 mg  150 mg Oral QHS Doda, Edd Arbour, MD   150 mg at 09/23/21 2106   simvastatin (ZOCOR) tablet 40 mg  40 mg Oral Q supper Suella Broad, FNP   40 mg at 09/23/21 1724   sodium chloride (OCEAN) 0.65 % nasal spray 1 spray  1 spray Each Nare PRN Suella Broad, FNP       thiamine tablet 100 mg  100 mg Oral Daily Nelda Marseille, Velena Keegan E, MD   100 mg at  09/24/21 0941   traZODone (DESYREL) tablet 50 mg  50 mg Oral QHS PRN Armando Reichert, MD       vitamin B-12 (CYANOCOBALAMIN) tablet 1,000 mcg  1,000 mcg Oral Daily Suella Broad, FNP   1,000 mcg at 09/24/21 0941   Vitamin D (Ergocalciferol) (DRISDOL) capsule 50,000 Units  50,000 Units Oral Q7 days Harlow Asa, MD   50,000 Units at 09/19/21 1824   Lab Results:  Results for orders placed or performed during the hospital encounter of 09/18/21 (from the past 48 hour(s))  Basic metabolic panel     Status: Abnormal   Collection Time: 09/23/21  7:22 AM  Result Value Ref Range   Sodium 140 135 - 145 mmol/L   Potassium 3.6 3.5  - 5.1 mmol/L   Chloride 106 98 - 111 mmol/L   CO2 23 22 - 32 mmol/L   Glucose, Bld 148 (H) 70 - 99 mg/dL    Comment: Glucose reference range applies only to samples taken after fasting for at least 8 hours.   BUN 19 6 - 20 mg/dL   Creatinine, Ser 0.99 0.44 - 1.00 mg/dL   Calcium 9.3 8.9 - 10.3 mg/dL   GFR, Estimated >60 >60 mL/min    Comment: (Knight) Calculated using the CKD-EPI Creatinine Equation (2021)    Anion gap 11 5 - 15    Comment: Performed at [facility], [place] [address], [place], [place] [address]    Blood Alcohol level:  Lab Results  Component Value Date   [facility] <10 09/16/2021   ETH <10 08/27/5101   Metabolic Disorder Labs: Lab Results  Component Value Date   HGBA1C 5.7 (H) 09/03/2021   MPG 116.89 09/03/2021   MPG 116.89 01/13/2020   No results found for: PROLACTIN Lab Results  Component Value Date   CHOL 230 (H) 09/03/2021   TRIG 111 09/03/2021   HDL 62 09/03/2021   CHOLHDL 3.7 09/03/2021   VLDL 22 09/03/2021   LDLCALC 146 (H) 09/03/2021   LDLCALC 114 (H) 01/13/2020    Physical Findings: CIWA:  CIWA-Ar Total: 0  Musculoskeletal: Strength & Muscle Tone: within normal limits Gait & Station:  Normal Patient leans:  NA  Psychiatric Specialty Exam:  Presentation  General Appearance: Casual, dress appropriate for environment  Eye Contact:Good  Speech:Clear and Coherent  Speech Volume:Normal  Handedness:Right  Mood and Affect  Mood:Anxious  Affect: anxious appearing  Thought Process  Thought Processes:Coherent; Linear , goal directed  Descriptions of Associations:Intact  Orientation:Full (Time, Place and Person)  Thought Content:Logical - denies AVH, paranoia, or delusions   History of Schizophrenia/Schizoaffective disorder:No  Hallucinations:Denied Ideas of Reference:Denied  Suicidal Thoughts:Denied Homicidal Thoughts:Denied  Sensorium  Memory:Immediate Fair; Recent Fair; Remote  Fair  Judgment:Fair  Insight:Fair  Executive Functions  Concentration:Fair  Attention Span:Fair  [place]  Psychomotor Activity  Psychomotor Activity:No agitation or retardation; AIMS 0; no cogwheeling, no stiffness, no tremor today  Assets  Assets:Communication Skills; Desire for Improvement; Financial Resources/Insurance;  Leisure Time  Sleep  Sleep: Not charted  Physical Exam Vitals reviewed.  Constitutional:      General: She is not in acute distress. HENT:     Head: Normocephalic and atraumatic.  Pulmonary:     Effort: Pulmonary effort is normal.  Neurological:     Mental Status: She is alert and oriented to person, place, and time.   Review of Systems  Constitutional:  Negative for chills, diaphoresis, fever, malaise/fatigue and weight loss.  HENT:  Negative for congestion.   Respiratory:  Negative for shortness of breath.        Cough improved  Cardiovascular:  Negative for chest pain and leg swelling.  Gastrointestinal:  Negative for constipation, diarrhea, nausea and vomiting.  Genitourinary:  Negative for dysuria.  Musculoskeletal:  Negative for joint pain.  All other systems reviewed and are negative.  Blood pressure (!) 124/92, pulse (!) 103, temperature 98.4 F (36.9 C), temperature source Oral, resp. rate 18, height 5' 8"  (1.727 m), weight 88.5 kg, SpO2 100 %. Body mass index is 29.67 kg/m.  Treatment Plan Summary: Daily contact with patient to assess and evaluate symptoms and progress in treatment and Medication management   ASSESSMENT Active Problems:   Bipolar II MRE depressed severe without current psychotic features   PTSD by hx   Stimulant use d/o - cocaine type   Alcohol use d/o by hx   Cannabis use d/o in early remission   Vitamin D deficiency   Hyperlipidemia   Asthma   HTN   GERD   [name] is a 55 year-old female with reported PPHx of depression, bipolar, PTSD who was  admitted to [facility] from [facility] ED due to suicide attempt with 32 pills of Tylenol PM. Patient was restarted on several medications, and she desires improvement in her mental state and sleep. Desires rehabilitation following discharge.   Labs reviewed:  CBC (11/9) - hemoglobin 11.4 CMP (11/13) - creatinine improved to 0.99 No new labs today  PLAN Psychiatric Problems Bipolar II MRE depressed severe without current psychotic features PTSD by hx R/o substance induced mood d/o - ECG 11/8 was normal, QTC 468m - Continue Seroquel 150 mg qhs to improve sleep and help with mood stabilization (rechecking EKG for QTC monitoring prior to dischrage) - Risperdal discontinued.  - Continue Remeron 15 mg daily at bedtime. - Continue Lamictal 236mdaily -Continue melatonin 10 mg daily at bedtime. -Continue trazodone 50 mg as needed for insomnia. - Stopped amantadine, which had been added for help with cocaine cravings to avoid return of psychosis as noted during previous inpatient admission.   Stimulant use d/o - cocaine type  Alcohol use d/o by hx  Cannabis use d/o in early remission - CIWA finished.  Last CIWA 0 - Continue thiamine 1003maily. - Continue multivitamin with minerals daily. - Continue folic acid 1mg74mily. -Continue Neurontin 100 mg 3 times daily for alcohol cravings. -- [facility] arranging transportation to confirm her bed.   Medical Problems  Asthma Cough/SOB - Medicine was consulted and continue the following recs:       - Continue Robitussin 100mg89m syrup q4 PRN      - Continue albuterol MDI and nebs and  Flovent       -Completed 5-day course of prednisone 40mg 106my      - Continue loratadine 10mg d38m      - Chest x-ray negative  Constipation -Continue MiraLAX daily -Continue Dulcolax 10 mg daily as needed.  Muscle tension in Hand -Tylenol as needed  HTN - Continue Norvasc 10mg - 33mwith daily  vitals   Hyperlipidemia - Continue simvastatin 40mg    1m - Continue Protonix 40mg   Vi34mn Deficiencies - Continue Vitamin B12 and Vitamin D    Weakness Was evaluated by physical therapy, patient given permission to ambulate. Will CTM.   Nicotine dependence Nicotine gum as needed for smoking cessation  MRSA positive - Finish bactroban nasal BID X 7 days per pharmacist recommendation  Recent AKI s/p overdose (resolved) - Repeat creatinine 0.99 on 11/13- continue to encourage po intake/hydration.  Leukocytosis - resolved - Repeat WBC 8.3 on 09/19/21  Elevated ALT s/p overdose - resolved - Repeat ALT 36 and AST 17   3. Safety and Monitoring: Involuntary admission to inpatient psychiatric unit for safety, stabilization and treatment Daily contact with patient to assess and evaluate symptoms and progress in treatment Patient's case to be discussed in multi-disciplinary team meeting Observation Level : q15 minute checks Vital signs: q12 hours Precautions: suicide, elopement   4. Discharge Planning: Social work and case management to assist with discharge planning and identification of hospital follow-up needs prior to discharge Discharge Concerns: Need to establish a safety plan; Medication compliance and effectiveness Discharge Goals: [facility] is [facility] [place] , needs outpatient referrals for mental health follow-up including medication management/psychotherapy   Armando Reichert, MD, PGY2 09/24/2021, 1:03 PM

## 2021-09-24 NOTE — Progress Notes (Signed)
Psychoeducational Group Note  Date:  09/24/2021 Time:  2224  Group Topic/Focus:  Wrap-Up Group:   The focus of this group is to help patients review their daily goal of treatment and discuss progress on daily workbooks.  Participation Level: Did Not Attend  Participation Quality:  Not Applicable  Affect:  Not Applicable  Cognitive:  Not Applicable  Insight:  Not Applicable  Engagement in Group: Not Applicable  Additional Comments:  The patient did not attend group this evening.   [name] S 09/24/2021, 10:24 PM

## 2021-09-24 NOTE — BHH Suicide Risk Assessment (Signed)
[facility] Discharge Suicide Risk Assessment   Principal Problem: Bipolar II disorder, most recent episode major depressive ([place]) Discharge Diagnoses: Principal Problem:   Bipolar II disorder, most recent episode major depressive ([place]) Active Problems:   Cocaine abuse ([place])   Primary hypertension   Asthma exacerbation, mild  Total Time Spent in Direct Patient Care:  I personally spent 30 minutes on the unit in direct patient care. The direct patient care time included face-to-face time with the patient, reviewing the patient's chart, communicating with other professionals, and coordinating care. Greater than 50% of this time was spent in counseling or coordinating care with the patient regarding goals of hospitalization, psycho-education, and discharge planning needs.  Musculoskeletal: Strength & Muscle Tone: within normal limits Gait & Station: normal Patient leans: N/A Psychiatric Specialty Exam: Physical Exam Vitals and nursing note reviewed.  Constitutional:      Appearance: Normal appearance.  HENT:     Head: Normocephalic.  Pulmonary:     Effort: Pulmonary effort is normal.  Neurological:     General: No focal deficit present.     Mental Status: She is alert.    Review of Systems  Respiratory:  Negative for shortness of breath.   Cardiovascular:  Negative for chest pain.  Gastrointestinal:  Positive for constipation. Negative for diarrhea, nausea and vomiting.   Blood pressure (!) 124/92, pulse (!) 103, temperature 98.4 F (36.9 C), temperature source Oral, resp. rate 18, height 5' 8"  (1.727 m), weight 88.5 kg, SpO2 100 %.Body mass index is 29.67 kg/m.  General Appearance:  casually dressed, adequate hygiene  Eye Contact:  Good  Speech:  Clear and Coherent and Normal Rate  Volume:  Normal  Mood:  Anxious  Affect:  Congruent  Thought Process:  Goal Directed and Linear  Orientation:  Full (Time, Place, and Person)  Thought Content:  Logical and denies AVH, paranoia, or  delusions  Suicidal Thoughts:   Denied  Homicidal Thoughts:   Denied  Memory:  Recent;   Good  Judgement:  Fair  Insight:  Fair  Psychomotor Activity:  Normal, no cogwheeling, no stiffness, no tremors  Concentration:  Concentration: Good and Attention Span: Good  Recall:  Good  Fund of Knowledge:  Good  Language:  Good  Akathisia:  Negative  AIMS (if indicated):   0  Assets:  Communication Skills Desire for Improvement Resilience  ADL's:  Intact  Cognition:  WNL  Sleep:  Number of Hours: 5   Mental Status Per Nursing Assessment::   On Admission:  Suicidal ideation indicated by patient - resolved  Demographic Factors:  Low socioeconomic status and Unemployed  Loss Factors: Decline in physical health  Historical Factors: Prior suicide attempts, substance use prior to admission; family h/o suicide  Risk Reduction Factors:   Sense of responsibility to family, Positive social support, and Positive coping skills or problem solving skills  Continued Clinical Symptoms:  Bipolar Disorder:   Bipolar II Alcohol/Substance Abuse/Dependencies More than one psychiatric diagnosis Previous Psychiatric Diagnoses and Treatments  Cognitive Features That Contribute To Risk:  None    Suicide Risk:  Moderate given repeated suicide attempts and recent substance abuse history- currently has no intent or plan, some risk factors present, and identifiable protective factors, including available and accessible social support.  Plan Of Care/Follow-up recommendations:  Activity:  as tolerated Diet:  heart healthy Other:  Patient advised to go directly to [facility] and to abstain from illicit drug and alcohol use. She was encouraged to keep scheduled  outpatient mental health follow up appointments and to comply with medications. She is advised that she will need ongoing monitoring of her lipids, glucose, CBC, AIMS, weight, and EKG while on Seroquel and will need to monitor for  development of rash/blisters (signs of Steven's Johnson Syndrome) while on Lamictal. She was advised to see a primary care provider or the health department without fail for management of her asthma, hypertension, and low Vitamin B12 and Vitamin D.   Harlow Asa, MD, FAPA 09/24/2021, 3:18 PM

## 2021-09-24 NOTE — BHH Group Notes (Signed)
Pt attended goals group and contributed

## 2021-09-24 NOTE — Progress Notes (Signed)
Manami come out of her room without shoes, without covering on her feet. Writer asked Trin[place] when she comes back in the dayroo m she needs to wear shoes or hospital socks. Briell  appeared angry and said she will not be coming out her room again. RN will be notify

## 2021-09-24 NOTE — Progress Notes (Addendum)
Patient confirmed that she was able to pay for train ticket.  Her train leaves tomorrow at 12:04pm and patient reports that representative from [place] will be picking her up from the train station in [place], [place].  [place] notified doctor regarding patient discharge plans.  Patient expecting to leave at [facility] tomorrow. Patient friend will be picking her up and taking her to her storage unit before dropping her off at the train station.    Daniel Ritthaler, LCSW, [place] Social Worker  [facility]

## 2021-09-24 NOTE — Progress Notes (Signed)
  [facility] Adult Case Management Discharge Plan :  Will you be returning to the same living situation after discharge:  No. Patient will be going to [facility] sober living in [place] area.  At discharge, do you have transportation home?: Yes,  friend will be picking patient up Do you have the ability to pay for your medications: Yes,  insurance  Release of information consent forms completed and in the chart;  Patient's signature needed at discharge.  Patient to Follow up at:  Follow-up Information     [facility] Follow up.   Specialty: Family Medicine Why: You have an appointment on 09/26/2021 at 11:15pm for medication management and primary care services Contact information: [address] [place] [address] [phone]         [facility] Follow up.   Specialty: Behavioral Health Why: You had an appointment on 09/17/21 at 1:00 pm for substance abuse intensive outpatient therapy services (SAIOP), and medication management will be available.  This appointment will be held in person   Please bring your insurance card.  * IF you have not met your out of pocket or copay amounts, the fee for the assessment is $125. Contact information: [place] [address] [phone]         [facility] Follow up.   Why: A referral has been made to this provider for SAIOP/CDIOP and medication management services.  Please call to schedule an appointment as soon as possible. Contact information: [place], [place] [address]   Phone: [phone] Fax:  [phone]        [facility]. Go to.   Why: you have been accepted for a women's bed at [facility] in [place] area.  Please take train to meet with representative so that they can take you to location. Contact information: [phone]                Next level of care provider  has access to [place] and Suicide Prevention discussed: Yes,  daughter.      Has patient been referred to the Quitline?: Patient refused referral. Patient going to sober living and discussed that she is going to focus on substance use before committing to tobacco abstinence.   Patient has been referred for addiction treatment: Yes  Denise Knight Denise Zaleah Ternes, LCSW 09/24/2021, 3:29 PM

## 2021-09-25 MED ORDER — NICOTINE POLACRILEX 2 MG MT GUM: 2.0000 mg | CHEWING_GUM | OROMUCOSAL | 0 refills | Status: DC | PRN

## 2021-09-25 MED ORDER — GABAPENTIN 100 MG PO CAPS: 100.0000 mg | ORAL_CAPSULE | Freq: Three times a day (TID) | ORAL | 0 refills | Status: DC

## 2021-09-25 MED ORDER — THIAMINE HCL 100 MG PO TABS: 100.0000 mg | ORAL_TABLET | Freq: Every day | ORAL | 0 refills | Status: DC

## 2021-09-25 MED ORDER — HYDROXYZINE HCL 25 MG PO TABS
25.0000 mg | ORAL_TABLET | Freq: Three times a day (TID) | ORAL | 0 refills | Status: DC | PRN
Start: 2021-09-25 — End: 2023-01-15

## 2021-09-25 MED ORDER — MUPIROCIN 2 % EX OINT: TOPICAL_OINTMENT | Freq: Two times a day (BID) | CUTANEOUS | 0 refills | Status: DC

## 2021-09-25 MED ORDER — PANTOPRAZOLE SODIUM 40 MG PO TBEC: 40.0000 mg | DELAYED_RELEASE_TABLET | Freq: Every morning | ORAL | 0 refills | Status: DC

## 2021-09-25 MED ORDER — SIMVASTATIN 40 MG PO TABS: 40.0000 mg | ORAL_TABLET | Freq: Every day | ORAL | 0 refills | Status: DC

## 2021-09-25 MED ORDER — VITAMIN D (ERGOCALCIFEROL) 1.25 MG (50000 UNIT) PO CAPS: 50000.0000 [IU] | ORAL_CAPSULE | ORAL | 0 refills | Status: DC

## 2021-09-25 MED ORDER — MIRTAZAPINE 15 MG PO TABS: 15.0000 mg | ORAL_TABLET | Freq: Every day | ORAL | 0 refills | Status: DC

## 2021-09-25 MED ORDER — ALBUTEROL SULFATE HFA 108 (90 BASE) MCG/ACT IN AERS: 1.0000 | INHALATION_SPRAY | Freq: Four times a day (QID) | RESPIRATORY_TRACT | 0 refills | Status: DC | PRN

## 2021-09-25 MED ORDER — SALINE SPRAY 0.65 % NA SOLN: 1.0000 | NASAL | 0 refills | Status: DC | PRN

## 2021-09-25 MED ORDER — LORATADINE 10 MG PO TABS: 10.0000 mg | ORAL_TABLET | Freq: Every day | ORAL | 0 refills | Status: DC

## 2021-09-25 MED ORDER — QUETIAPINE FUMARATE 150 MG PO TABS: 150.0000 mg | ORAL_TABLET | Freq: Every day | ORAL | 0 refills | Status: DC

## 2021-09-25 MED ORDER — MELATONIN 10 MG PO TABS
10.0000 mg | ORAL_TABLET | Freq: Every day | ORAL | 0 refills | Status: DC
Start: 2021-09-25 — End: 2023-01-15

## 2021-09-25 MED ORDER — LAMOTRIGINE 25 MG PO TABS: 25.0000 mg | ORAL_TABLET | Freq: Every day | ORAL | 0 refills | Status: DC

## 2021-09-25 MED ORDER — BISACODYL 5 MG PO TBEC: 10.0000 mg | DELAYED_RELEASE_TABLET | Freq: Every day | ORAL | 0 refills | Status: DC | PRN

## 2021-09-25 MED ORDER — FOLIC ACID 1 MG PO TABS: 1.0000 mg | ORAL_TABLET | Freq: Every day | ORAL | 0 refills | Status: DC

## 2021-09-25 MED ORDER — FLUTICASONE PROPIONATE HFA 110 MCG/ACT IN AERO: 1.0000 | INHALATION_SPRAY | Freq: Two times a day (BID) | RESPIRATORY_TRACT | 0 refills | Status: DC

## 2021-09-25 MED ORDER — VITAMIN B-12 1000 MCG PO TABS
1000.0000 ug | ORAL_TABLET | Freq: Every day | ORAL | 0 refills | Status: DC
Start: 2021-09-25 — End: 2023-01-15

## 2021-09-25 MED ORDER — ADULT MULTIVITAMIN W/MINERALS CH: 1.0000 | ORAL_TABLET | Freq: Every day | ORAL | Status: DC

## 2021-09-25 MED ORDER — GUAIFENESIN 100 MG/5ML PO LIQD
10.0000 mL | ORAL | 0 refills | Status: DC | PRN
Start: 2021-09-25 — End: 2023-01-15

## 2021-09-25 NOTE — Progress Notes (Signed)
Nursing discharge note: Patient discharged home per MD order.  Patient received all personal belongings from unit and locker.  Reviewed AVS/transition record with patient and she indicates understanding.  Patient will follow up with [facility] in [place].  Patient denies any thoughts of self harm.  She left ambulatory with a friend.   09/25/21 0900  Psych Admission Type (Psych Patients Only)  Admission Status Voluntary  Psychosocial Assessment  Patient Complaints Anxiety  Eye Contact Fair  Facial Expression Animated  Affect Appropriate to circumstance  Speech Logical/coherent  Interaction Assertive  Motor Activity Slow  Appearance/Hygiene Unremarkable  Behavior Characteristics Cooperative  Mood Anxious  Thought Process  Coherency WDL  Content WDL  Delusions None reported or observed  Perception WDL  Judgment Poor  Confusion None  Danger to Self  Current suicidal ideation? Denies  Description of Agreement verbal  Danger to Others  Danger to Others None reported or observed

## 2021-09-25 NOTE — Plan of Care (Signed)
Patient discharged today per MD order. Patient received all belongings from locker and room. She states she has a friend picking her up today. She needs [place] at the train station at 1200 to meet sober living representative. She denies any thoughts of self harm.

## 2021-09-25 NOTE — Progress Notes (Signed)
Pt visible on the unit, pt given PRN Trazodone with HS medication    09/25/21 0000  Psych Admission Type (Psych Patients Only)  Admission Status Voluntary  Psychosocial Assessment  Patient Complaints Anxiety;Depression  Eye Contact Fair  Facial Expression Animated  Affect Appropriate to circumstance  Speech Logical/coherent  Interaction Assertive;Attention-seeking  Motor Activity Slow  Appearance/Hygiene Unremarkable  Behavior Characteristics Cooperative  Mood Anxious;Depressed  Aggressive Behavior  Effect No apparent injury  Thought Process  Coherency WDL  Content WDL  Delusions None reported or observed  Perception WDL  Hallucination None reported or observed  Judgment Poor  Confusion None  Danger to Self  Current suicidal ideation? Denies  Self-Injurious Behavior No self-injurious ideation or behavior indicators observed or expressed   Agreement Not to Harm Self Yes  Description of Agreement Verbal contract  Danger to Others  Danger to Others None reported or observed

## 2021-09-28 NOTE — BHH Group Notes (Signed)
Spiritual care group on grief and loss facilitated by chaplain Janne Napoleon, [facility]   Group Goal:   Support / Education around grief and loss   Members engage in facilitated group support and psycho-social education.   Group Description:   Following introductions and group rules, group members engaged in facilitated group dialog and support around topic of loss, with particular support around experiences of loss in their lives. Group Identified types of loss (relationships / self / things) and identified patterns, circumstances, and changes that precipitate losses. Reflected on thoughts / feelings around loss, normalized grief responses, and recognized variety in grief experience. Group noted Worden's four tasks of grief in discussion.   Group drew on Adlerian / Rogerian, narrative, MI,   Patient Progress:  Denise Knight participated in group and was engaged in conversation.  Her demeanor was quiet and she was coloring, but she was engaged and her comments were on topic and appropriate.  Chaplain Janne Napoleon, [facility], [phone] 9:47 AM

## 2022-01-14 ENCOUNTER — Emergency Department (HOSPITAL_COMMUNITY)

## 2022-01-14 ENCOUNTER — Emergency Department (HOSPITAL_COMMUNITY)
Admission: EM | Admit: 2022-01-14 | Discharge: 2022-01-15 | Disposition: A | Discharge status: Home / Self Care | Attending: Emergency Medicine | Admitting: Emergency Medicine

## 2022-01-14 ENCOUNTER — Other Ambulatory Visit: Payer: Self-pay

## 2022-01-14 ENCOUNTER — Encounter (HOSPITAL_COMMUNITY): Payer: Self-pay | Admitting: Emergency Medicine

## 2022-01-14 DIAGNOSIS — R45851 Suicidal ideations: Secondary | ICD-10-CM | POA: Insufficient documentation

## 2022-01-14 DIAGNOSIS — Z79899 Other long term (current) drug therapy: Secondary | ICD-10-CM | POA: Diagnosis not present

## 2022-01-14 DIAGNOSIS — F1020 Alcohol dependence, uncomplicated: Secondary | ICD-10-CM | POA: Diagnosis present

## 2022-01-14 DIAGNOSIS — Z20822 Contact with and (suspected) exposure to covid-19: Secondary | ICD-10-CM | POA: Insufficient documentation

## 2022-01-14 DIAGNOSIS — I10 Essential (primary) hypertension: Secondary | ICD-10-CM | POA: Insufficient documentation

## 2022-01-14 DIAGNOSIS — R079 Chest pain, unspecified: Secondary | ICD-10-CM

## 2022-01-14 DIAGNOSIS — F1494 Cocaine use, unspecified with cocaine-induced mood disorder: Secondary | ICD-10-CM | POA: Insufficient documentation

## 2022-01-14 DIAGNOSIS — Z046 Encounter for general psychiatric examination, requested by authority: Secondary | ICD-10-CM | POA: Diagnosis present

## 2022-01-14 DIAGNOSIS — R0789 Other chest pain: Secondary | ICD-10-CM | POA: Insufficient documentation

## 2022-01-14 DIAGNOSIS — F3181 Bipolar II disorder: Secondary | ICD-10-CM | POA: Insufficient documentation

## 2022-01-14 DIAGNOSIS — F19939 Other psychoactive substance use, unspecified with withdrawal, unspecified: Secondary | ICD-10-CM | POA: Insufficient documentation

## 2022-01-14 LAB — CBC
HCT: 41.9 % (ref 36.0–46.0)
Hemoglobin: 13.4 g/dL (ref 12.0–15.0)
MCH: 25.6 pg — ABNORMAL LOW (ref 26.0–34.0)
MCHC: 32 g/dL (ref 30.0–36.0)
MCV: 80.1 fL (ref 80.0–100.0)
Platelets: 350 10*3/uL (ref 150–400)
RBC: 5.23 MIL/uL — ABNORMAL HIGH (ref 3.87–5.11)
RDW: 14.3 % (ref 11.5–15.5)
WBC: 11.2 10*3/uL — ABNORMAL HIGH (ref 4.0–10.5)
nRBC: 0 % (ref 0.0–0.2)

## 2022-01-14 LAB — ETHANOL: Alcohol, Ethyl (B): <10 mg/dL (ref <10)

## 2022-01-14 LAB — TROPONIN I (HIGH SENSITIVITY)
Troponin I (High Sensitivity): 12 ng/L (ref <18)
Troponin I (High Sensitivity): 11 ng/L (ref <18)

## 2022-01-14 LAB — BASIC METABOLIC PANEL
Anion gap: 14 (ref 5–15)
BUN: 8 mg/dL (ref 6–20)
CO2: 20 mmol/L — ABNORMAL LOW (ref 22–32)
Calcium: 9.5 mg/dL (ref 8.9–10.3)
Chloride: 102 mmol/L (ref 98–111)
Creatinine, Ser: 0.89 mg/dL (ref 0.44–1.00)
GFR, Estimated: >60 mL/min (ref >60)
Glucose, Bld: 75 mg/dL (ref 70–99)
Potassium: 3.7 mmol/L (ref 3.5–5.1)
Sodium: 136 mmol/L (ref 135–145)

## 2022-01-14 LAB — RAPID URINE DRUG SCREEN, HOSP PERFORMED
Amphetamines: NOT DETECTED
Barbiturates: NOT DETECTED
Benzodiazepines: NOT DETECTED
Cocaine: POSITIVE — AB
Opiates: NOT DETECTED
Tetrahydrocannabinol: NOT DETECTED

## 2022-01-14 LAB — RESP PANEL BY RT-PCR (FLU A&B, COVID) ARPGX2
Influenza A by PCR: NEGATIVE
Influenza B by PCR: NEGATIVE
SARS Coronavirus 2 by RT PCR: POSITIVE — AB

## 2022-01-14 IMAGING — CR DG CHEST 2V
2 series · 2 of 2 positions shown · non-contrast
Comparison: Chest radiograph [DATE]

CLINICAL DATA: Chest pressure, nausea, emesis, syncope

EXAM:
CHEST - 2 VIEW

[chest lat]
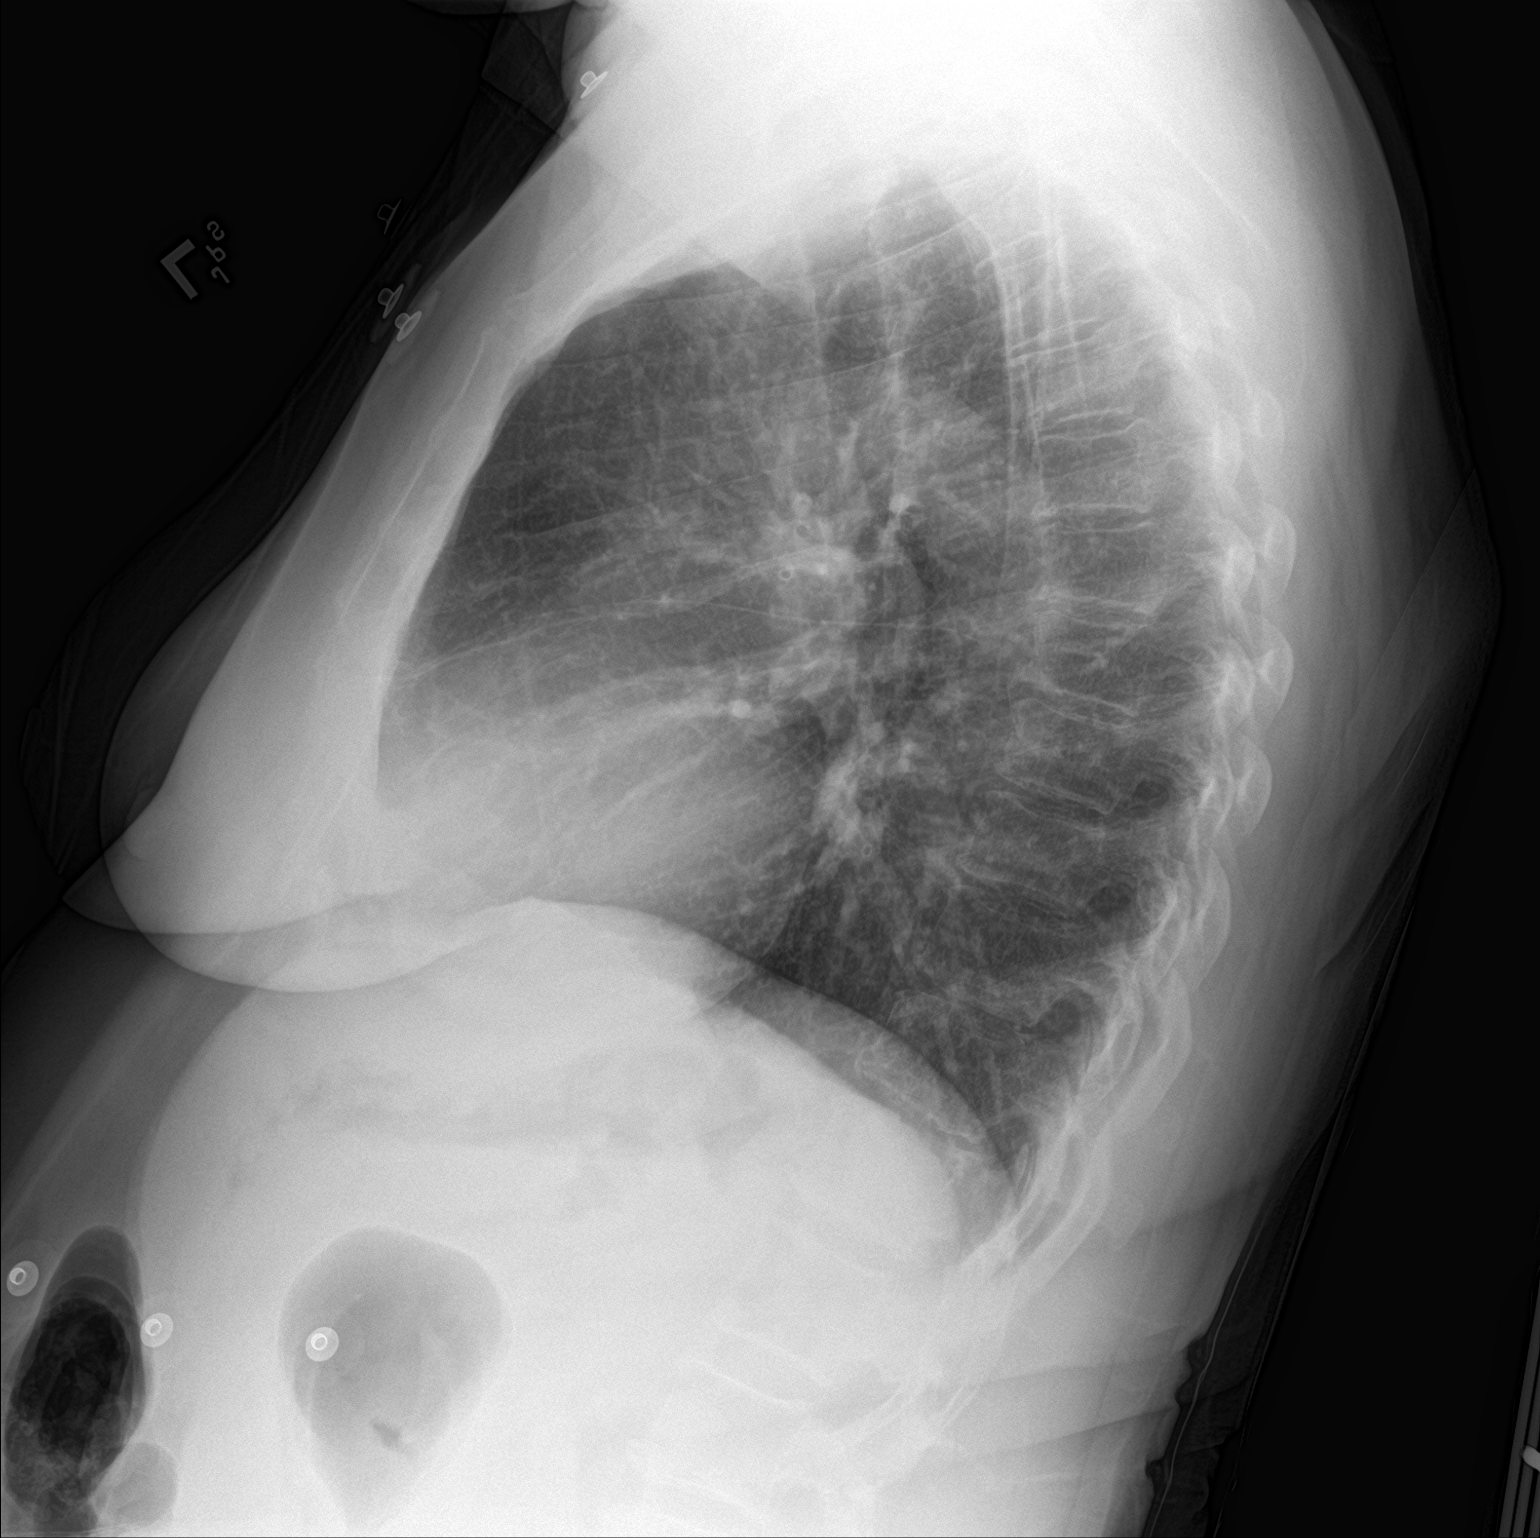

[chest ap]
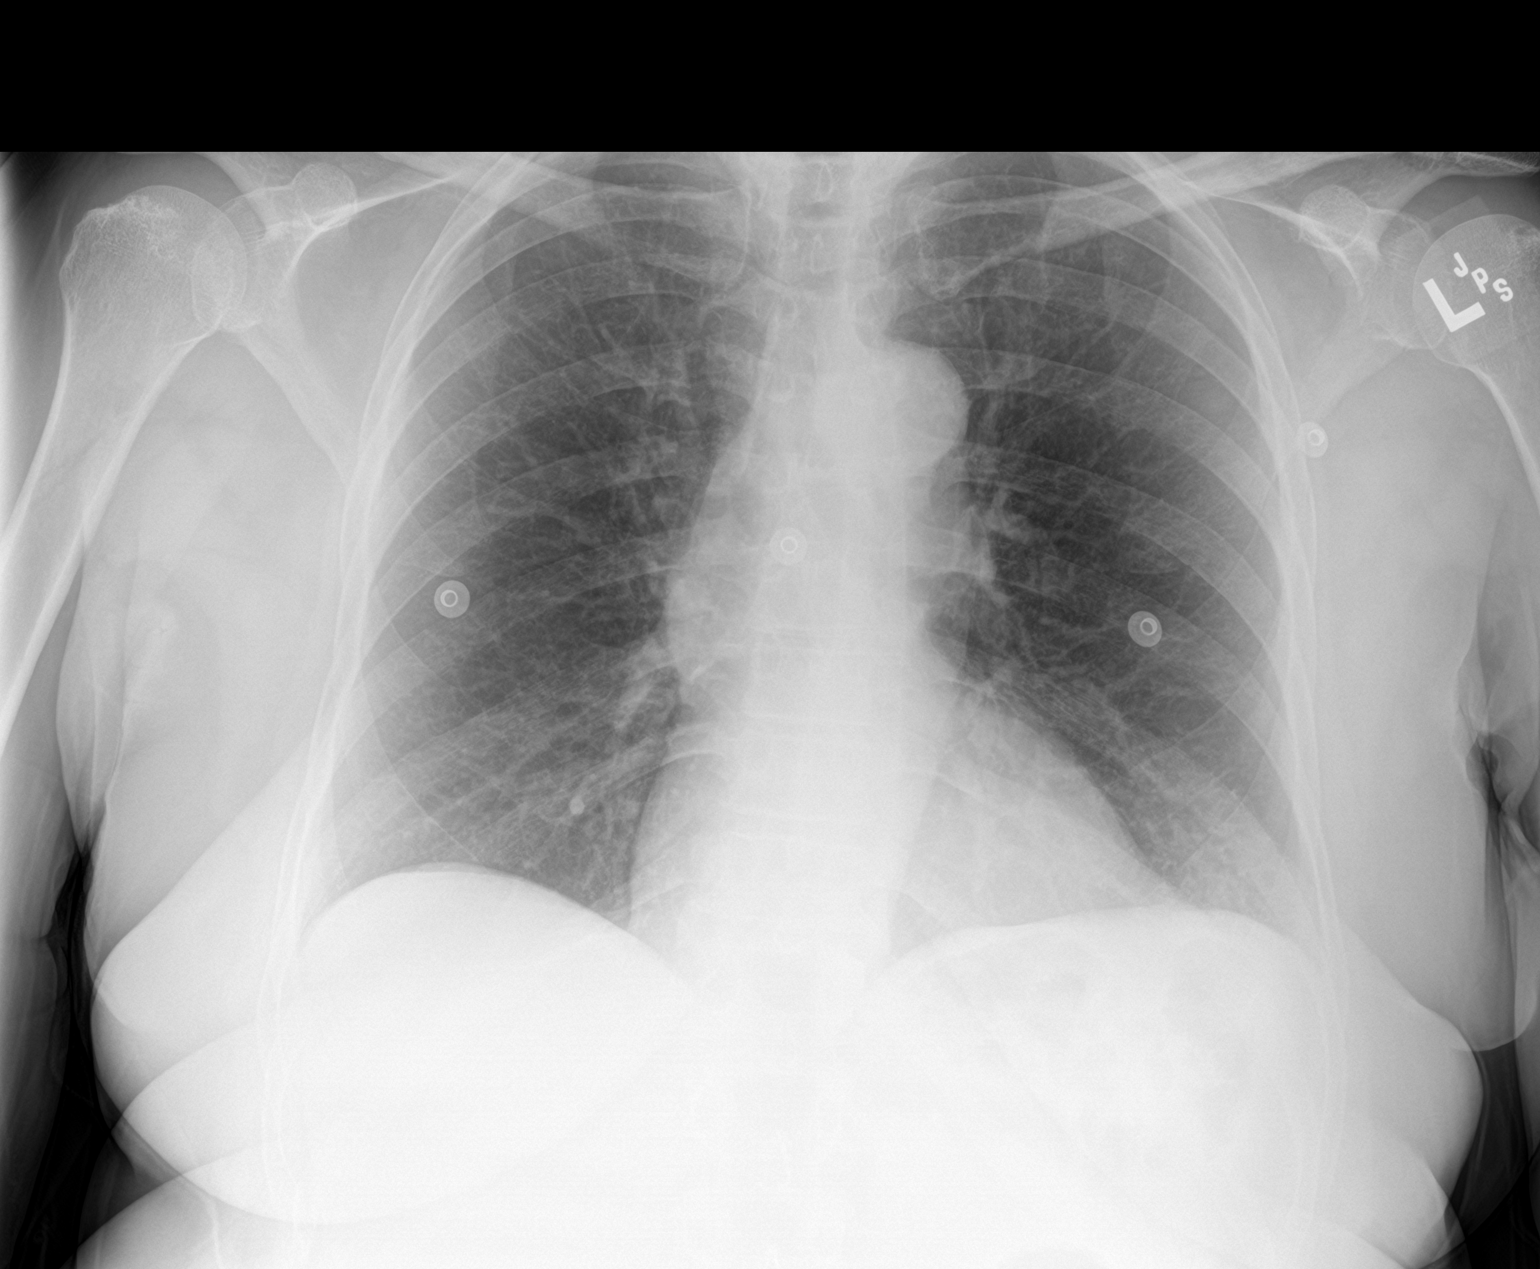

[2 of 2 positions shown; findings below may reference images not displayed]

FINDINGS: The cardiomediastinal silhouette is within normal limits.

There is no focal consolidation or pulmonary edema. There is no
pleural effusion or pneumothorax.

There is no acute osseous abnormality.
IMPRESSION: No radiographic evidence of acute cardiopulmonary process.

## 2022-01-14 IMAGING — CT CT ANGIO CHEST
2 of 6 series · 19 of 36 positions shown · IV contrast (agent unspecified)
Comparison: [DATE]

CLINICAL DATA: Chest pain, nausea, high probability for pulmonary
embolism

EXAM:
CT ANGIOGRAPHY CHEST WITH CONTRAST
TECHNIQUE: Multidetector CT imaging of the chest was performed using the
standard protocol during bolus administration of intravenous
contrast. Multiplanar CT image reconstructions and MIPs were
obtained to evaluate the vascular anatomy.

[Series 7: pe thins · axial · 0.65mm/px · z∈[-384,-139]mm · 18 of 390 slices shown]
[im 20/390  lung]
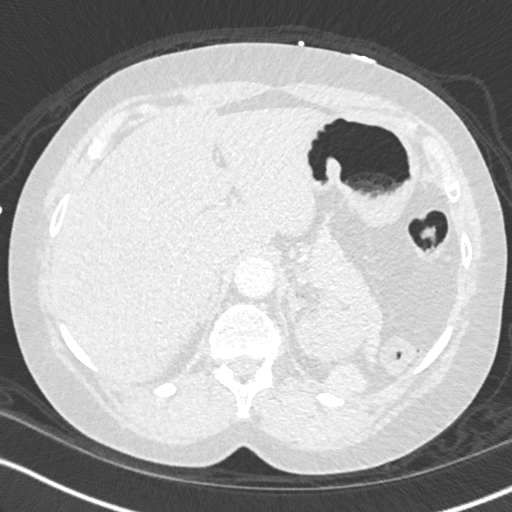
[im 39/390  mediastinal]
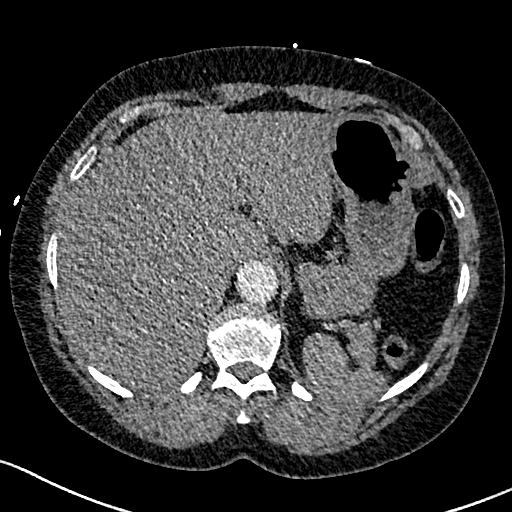
[im 59/390  lung]
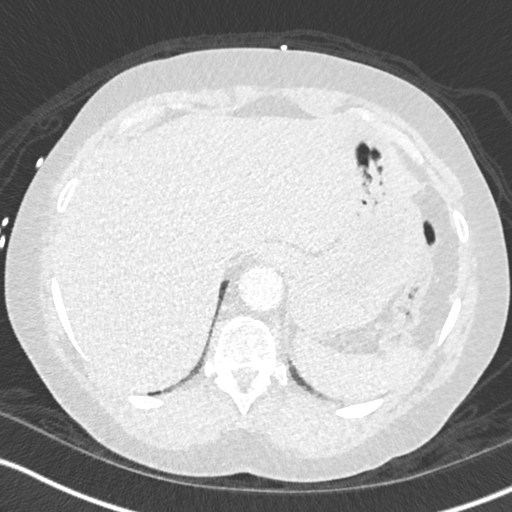
[im 78/390  mediastinal]
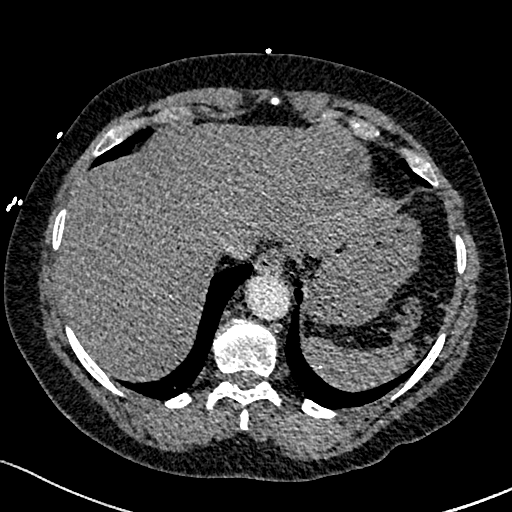
[im 98/390  lung]
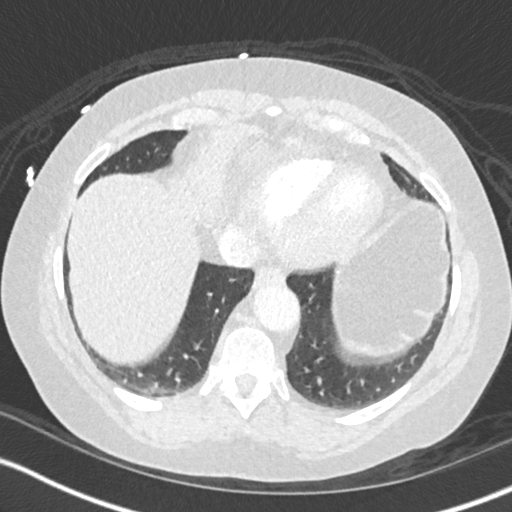
[im 117/390  mediastinal]
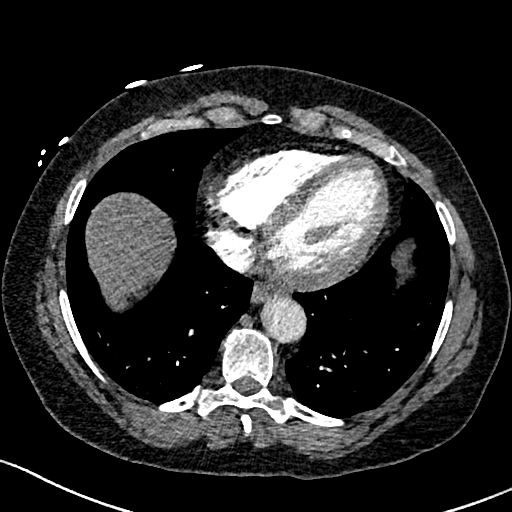
[im 137/390  lung]
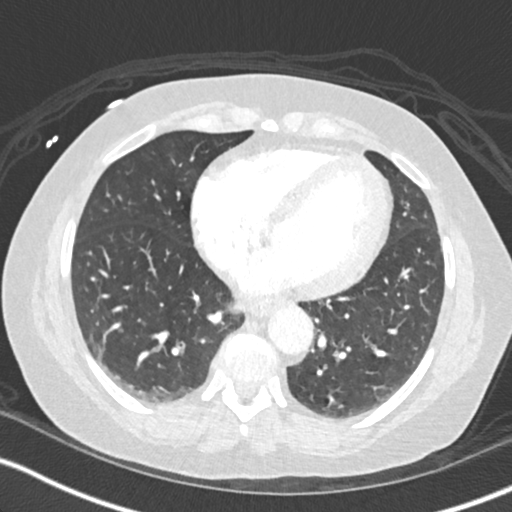
[im 156/390  mediastinal]
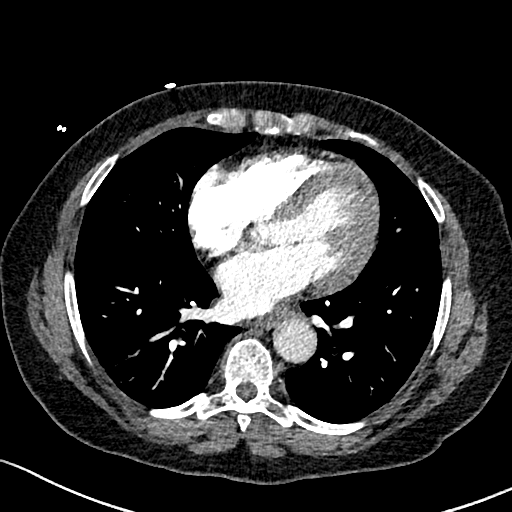
[im 176/390  lung]
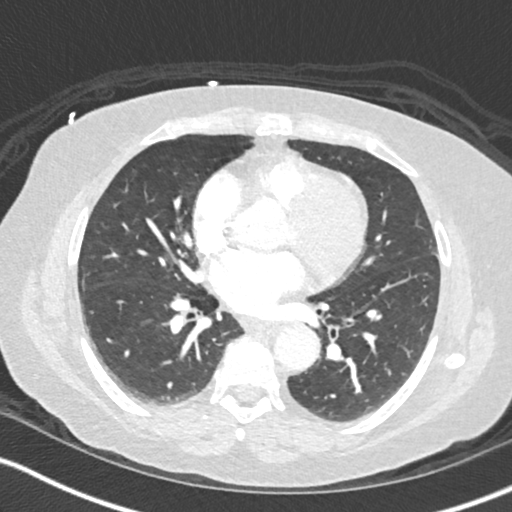
[im 214/390  mediastinal]
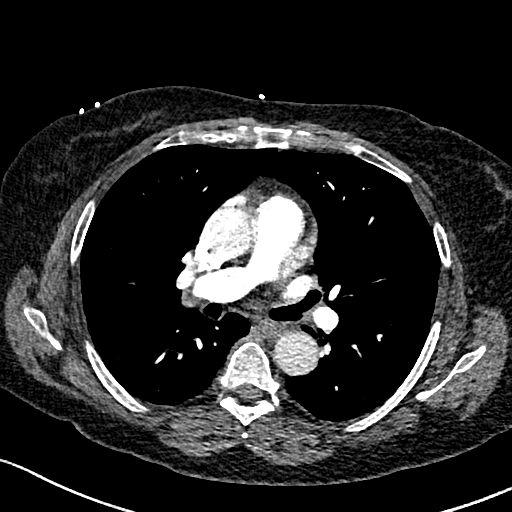
[im 234/390  lung]
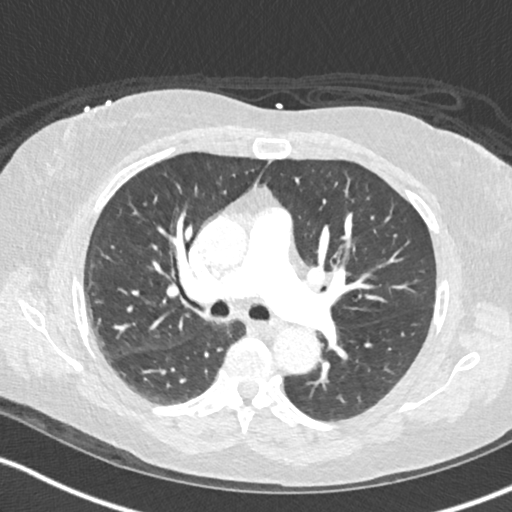
[im 253/390  mediastinal]
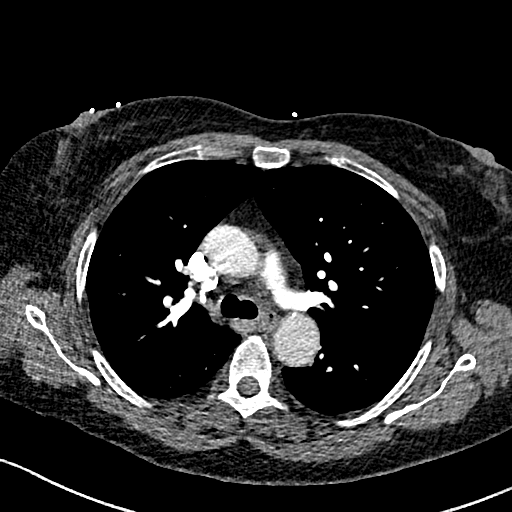
[im 273/390  lung]
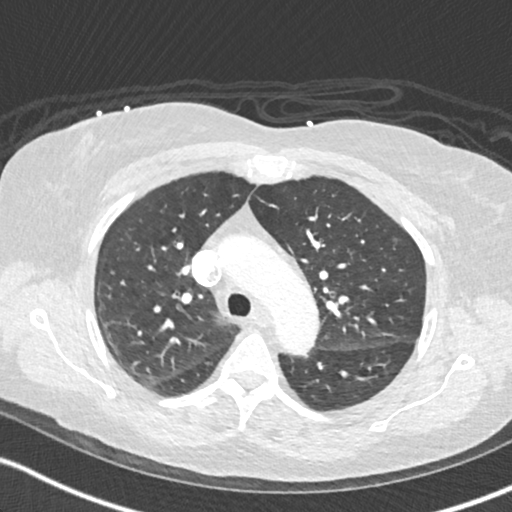
[im 292/390  mediastinal]
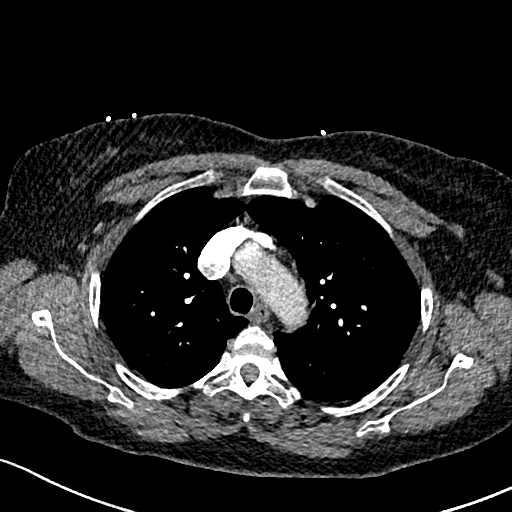
[im 312/390  lung]
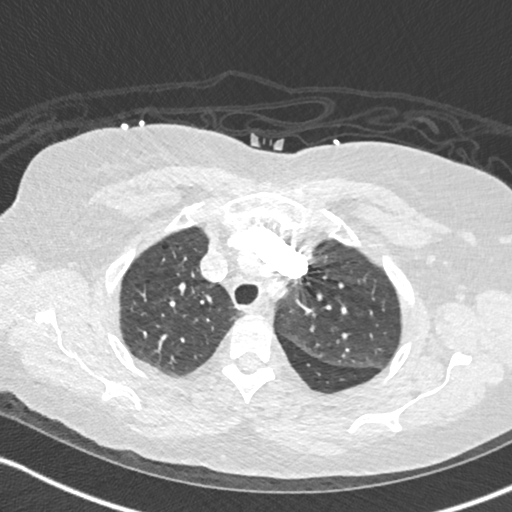
[im 331/390  mediastinal]
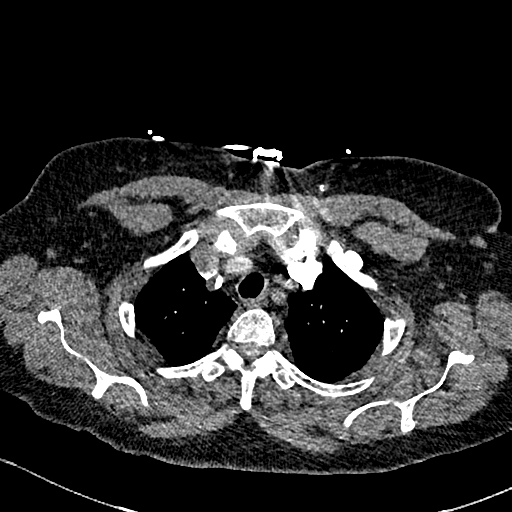
[im 351/390  lung]
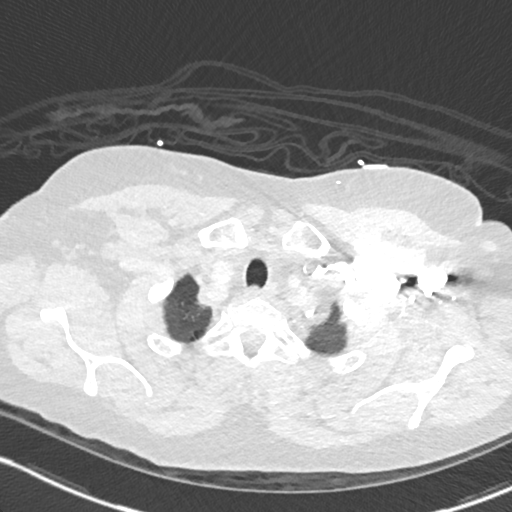
[im 370/390  mediastinal]
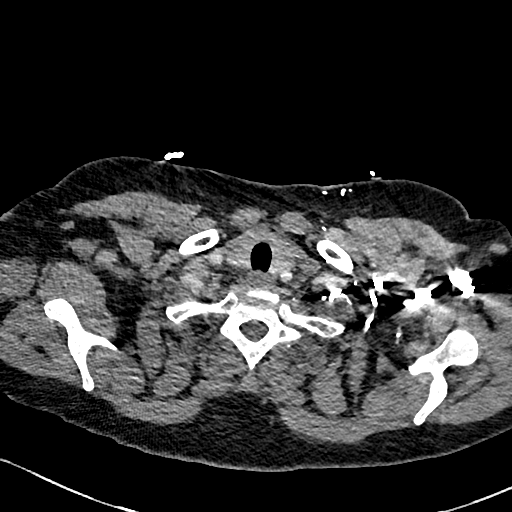

[Series 8: pe 2mm cor · coronal · 0.54mm/px · 1 of 150 slices shown]
[im 75/150  mediastinal]
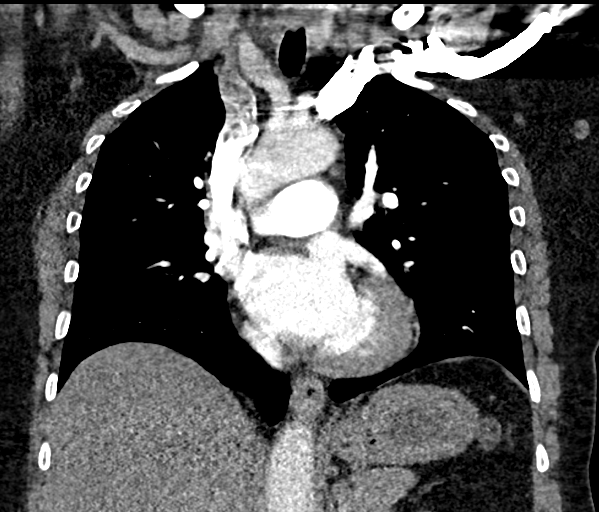

[19 of 36 positions shown; findings below may reference images not displayed]

RADIATION DOSE REDUCTION: This exam was performed according to the
departmental dose-optimization program which includes automated
exposure control, adjustment of the mA and/or kV according to
patient size and/or use of iterative reconstruction technique.

CONTRAST:  100mL OMNIPAQUE IOHEXOL 350 MG/ML SOLN
FINDINGS: Cardiovascular: There is homogeneous enhancement in thoracic aorta.
There are no intraluminal filling defects in the pulmonary artery
branches.

Mediastinum/Nodes: Unremarkable.

Lungs/Pleura: There is no focal pulmonary consolidation. There is no
pleural effusion or pneumothorax.

Upper Abdomen: Small calcification in the spleen suggests healed
granuloma.

Musculoskeletal: Unremarkable.

Review of the MIP images confirms the above findings.
IMPRESSION: There is no evidence of pulmonary artery embolism. There is no
evidence of thoracic aortic dissection. There is no focal pulmonary
consolidation.

## 2022-01-14 MED ORDER — IOHEXOL 350 MG/ML SOLN
100.0000 mL | Freq: Once | INTRAVENOUS | Status: AC | PRN
  Administered 2022-01-14: 100 mL via INTRAVENOUS

## 2022-01-14 MED ORDER — FENTANYL CITRATE PF 50 MCG/ML IJ SOSY
50.0000 ug | PREFILLED_SYRINGE | Freq: Once | INTRAMUSCULAR | Status: AC
  Administered 2022-01-14: 50 ug via INTRAVENOUS
  Filled 2022-01-14: qty 1

## 2022-01-14 NOTE — ED Triage Notes (Signed)
Patient here with complaint of chest pressure, nausea, and emesis that started a few hours ago. Patient states she lost consciousness earlier, fell and hit the left side of her forehead, no visible injury this area.  ?

## 2022-01-14 NOTE — ED Notes (Signed)
Patient transported to CT 
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
## 2022-01-14 NOTE — ED Notes (Signed)
Pt doing TTS assessment  ?

## 2022-01-14 NOTE — ED Notes (Signed)
This RN went into pt.'s room to check on her and the pt asked about being admitted for suicidal thoughts. Pt explained that she's feeling overwhelmed and has a plan to "OD on pills." Dr. Tomi Bamberger made aware. Reassurance provided to pt. ?

## 2022-01-14 NOTE — ED Notes (Addendum)
Pt has been dressed out, 3 bags of belongings collected and 1 valuables with security. Belongings in locker 7 ?

## 2022-01-14 NOTE — ED Notes (Signed)
Pt makes suicidal comments while going back to room 20. Transport planner.   ?

## 2022-01-14 NOTE — ED Provider Notes (Signed)
[facility] EMERGENCY DEPARTMENT Provider Note   CSN: [id number] Arrival date & time: 01/14/22  1146     History  Chief Complaint  Patient presents with   Chest Pain    Denise Knight is a 56 y.o. female.   Chest Pain  HPI: A 56 year old patient with a history of hypertension and hypercholesterolemia presents for evaluation of chest pain. Initial onset of pain was approximately 1-3 hours ago. The patient's chest pain is described as heaviness/pressure/tightness and is not worse with exertion. The patient complains of nausea. The patient's chest pain is middle- or left-sided, is not well-localized, is not sharp and does not radiate to the arms/jaw/neck. The patient denies diaphoresis. The patient has smoked in the past 90 days. The patient has no history of stroke, has no history of peripheral artery disease, denies any history of treated diabetes, has no relevant family history of coronary artery disease (first degree relative at less than age 5) and does not have an elevated BMI (>=30).   Home Medications Prior to Admission medications   Medication Sig Start Date End Date Taking? Authorizing Provider  albuterol (VENTOLIN HFA) 108 (90 Base) MCG/ACT inhaler Inhale 1-2 puffs into the lungs every 6 (six) hours as needed for wheezing or shortness of breath. 09/25/21   Armando Reichert, MD  amLODipine (NORVASC) 10 MG tablet Take 10 mg by mouth every morning. 05/03/21   [provider]  bisacodyl (DULCOLAX) 5 MG EC tablet Take 2 tablets (10 mg total) by mouth daily as needed for moderate constipation. 09/25/21   Armando Reichert, MD  fluticasone (FLOVENT HFA) 110 MCG/ACT inhaler Inhale 1 puff into the lungs 2 (two) times daily. 09/25/21   Armando Reichert, MD  folic acid (FOLVITE) 1 MG tablet Take 1 tablet (1 mg total) by mouth daily. 09/25/21   Armando Reichert, MD  gabapentin (NEURONTIN) 100 MG capsule Take 1 capsule (100 mg total) by mouth 3 (three) times daily.  09/25/21   Armando Reichert, MD  guaiFENesin (ROBITUSSIN) 100 MG/5ML liquid Take 10 mLs by mouth every 4 (four) hours as needed for to loosen phlegm. 09/25/21   Armando Reichert, MD  hydrOXYzine (ATARAX/VISTARIL) 25 MG tablet Take 1 tablet (25 mg total) by mouth 3 (three) times daily as needed for anxiety. 09/25/21   Armando Reichert, MD  lamoTRIgine (LAMICTAL) 25 MG tablet Take 1 tablet (25 mg total) by mouth daily. 09/25/21 10/25/21  Armando Reichert, MD  loratadine (CLARITIN) 10 MG tablet Take 1 tablet (10 mg total) by mouth daily. 09/25/21   Armando Reichert, MD  Melatonin 10 MG TABS Take 10 mg by mouth at bedtime. 09/25/21   Armando Reichert, MD  mirtazapine (REMERON) 15 MG tablet Take 1 tablet (15 mg total) by mouth at bedtime. 09/25/21 10/25/21  Armando Reichert, MD  Multiple Vitamin (MULTIVITAMIN WITH MINERALS) TABS tablet Take 1 tablet by mouth daily. 09/25/21   Armando Reichert, MD  mupirocin ointment (BACTROBAN) 2 % Place into the nose 2 (two) times daily. Apply in Nose 2 times daily until 09/27/21. 09/27/21   Armando Reichert, MD  nicotine polacrilex (NICORETTE) 2 MG gum Take 1 each (2 mg total) by mouth as needed for smoking cessation. 09/25/21   Armando Reichert, MD  pantoprazole (PROTONIX) 40 MG tablet Take 1 tablet (40 mg total) by mouth every morning. 09/25/21   Armando Reichert, MD  QUEtiapine Fumarate 150 MG TABS Take 150 mg by mouth at bedtime. 09/25/21   Armando Reichert, MD  simvastatin (ZOCOR) 40 MG  tablet Take 1 tablet (40 mg total) by mouth daily with supper. 09/25/21   Armando Reichert, MD  sodium chloride (OCEAN) 0.65 % SOLN nasal spray Place 1 spray into both nostrils as needed for congestion. 09/25/21   Armando Reichert, MD  thiamine 100 MG tablet Take 1 tablet (100 mg total) by mouth daily. 09/25/21   Armando Reichert, MD  vitamin B-12 (CYANOCOBALAMIN) 1000 MCG tablet Take 1 tablet (1,000 mcg total) by mouth daily. 09/25/21   Armando Reichert, MD  Vitamin D, Ergocalciferol, (DRISDOL) 1.25 MG (50000 UNIT) CAPS capsule  Take 1 capsule (50,000 Units total) by mouth every 7 (seven) days. 09/26/21   Armando Reichert, MD  amantadine (SYMMETREL) 100 MG capsule Take 1 capsule (100 mg total) by mouth 2 (two) times daily. 01/17/20 10/13/20  Connye Burkitt, NP      Allergies    Aspirin, Food, and Morphine and related    Review of Systems   Review of Systems  Cardiovascular:  Positive for chest pain.  All other systems reviewed and are negative.  Physical Exam Updated Vital Signs BP (!) 130/97    Pulse (!) 105    Temp 97.8 F (36.6 C) (Oral)    Resp (!) 24    SpO2 94%  Physical Exam Vitals and nursing note reviewed.  Constitutional:      Appearance: She is well-developed.     Comments: Appears to be in pain  HENT:     Head: Normocephalic and atraumatic.     Right Ear: External ear normal.     Left Ear: External ear normal.  Eyes:     General: No scleral icterus.       Right eye: No discharge.        Left eye: No discharge.     Conjunctiva/sclera: Conjunctivae normal.  Neck:     Trachea: No tracheal deviation.  Cardiovascular:     Rate and Rhythm: Normal rate and regular rhythm.  Pulmonary:     Effort: Pulmonary effort is normal. No respiratory distress.     Breath sounds: Normal breath sounds. No stridor. No wheezing or rales.  Abdominal:     General: Bowel sounds are normal. There is no distension.     Palpations: Abdomen is soft.     Tenderness: There is no abdominal tenderness. There is no guarding or rebound.  Musculoskeletal:        General: No tenderness or deformity.     Cervical back: Neck supple.  Skin:    General: Skin is warm and dry.     Findings: No rash.  Neurological:     General: No focal deficit present.     Mental Status: She is alert.     Cranial Nerves: No cranial nerve deficit (no facial droop, extraocular movements intact, no slurred speech).     Sensory: No sensory deficit.     Motor: No abnormal muscle tone or seizure activity.     Coordination: Coordination normal.   Psychiatric:        Mood and Affect: Mood normal.    ED Results / Procedures / Treatments   Labs (all labs ordered are listed, but only abnormal results are displayed) Labs Reviewed  BASIC METABOLIC PANEL - Abnormal; Notable for the following components:      Result Value   CO2 20 (*)    All other components within normal limits  CBC - Abnormal; Notable for the following components:   WBC 11.2 (*)    RBC 5.23 (*)  MCH 25.6 (*)    All other components within normal limits  RESP PANEL BY RT-PCR (FLU A&B, COVID) ARPGX2  TROPONIN I (HIGH SENSITIVITY)  TROPONIN I (HIGH SENSITIVITY)    EKG EKG Interpretation  Date/Time:  Monday January 14 2022 12:16:00 EST Ventricular Rate:  108 PR Interval:  140 QRS Duration: 60 QT Interval:  356 QTC Calculation: 477 R Axis:   49 Text Interpretation: Sinus tachycardia Cannot rule out Inferior infarct , age undetermined Abnormal ECG When compared with ECG of 24-Sep-2021 16:25, PREVIOUS ECG IS PRESENT Since last tracing rate faster Confirmed by Dorie Rank [phone]) on 01/14/2022 1:51:50 PM  Radiology DG Chest 2 View  Result Date: 01/14/2022 CLINICAL DATA:  Chest pressure, nausea, emesis, syncope EXAM: CHEST - 2 VIEW COMPARISON:  Chest radiograph 09/20/2021 FINDINGS: The cardiomediastinal silhouette is within normal limits. There is no focal consolidation or pulmonary edema. There is no pleural effusion or pneumothorax. There is no acute osseous abnormality. IMPRESSION: No radiographic evidence of acute cardiopulmonary process. Electronically Signed   By: Valetta Mole M.D.   On: 01/14/2022 12:47   CT Angio Chest PE W and/or Wo Contrast  Result Date: 01/14/2022 CLINICAL DATA:  Chest pain, nausea, high probability for pulmonary embolism EXAM: CT ANGIOGRAPHY CHEST WITH CONTRAST TECHNIQUE: Multidetector CT imaging of the chest was performed using the standard protocol during bolus administration of intravenous contrast. Multiplanar CT image reconstructions  and MIPs were obtained to evaluate the vascular anatomy. RADIATION DOSE REDUCTION: This exam was performed according to the departmental dose-optimization program which includes automated exposure control, adjustment of the mA and/or kV according to patient size and/or use of iterative reconstruction technique. CONTRAST:  157m OMNIPAQUE IOHEXOL 350 MG/ML SOLN COMPARISON:  10/06/2018 FINDINGS: Cardiovascular: There is homogeneous enhancement in thoracic aorta. There are no intraluminal filling defects in the pulmonary artery branches. Mediastinum/Nodes: Unremarkable. Lungs/Pleura: There is no focal pulmonary consolidation. There is no pleural effusion or pneumothorax. Upper Abdomen: Small calcification in the spleen suggests healed granuloma. Musculoskeletal: Unremarkable. Review of the MIP images confirms the above findings. IMPRESSION: There is no evidence of pulmonary artery embolism. There is no evidence of thoracic aortic dissection. There is no focal pulmonary consolidation. Electronically Signed   By: PElmer PickerM.D.   On: 01/14/2022 15:05    Procedures Procedures    Medications Ordered in ED Medications  fentaNYL (SUBLIMAZE) injection 50 mcg (50 mcg Intravenous Given 01/14/22 1410)  iohexol (OMNIPAQUE) 350 MG/ML injection 100 mL (100 mLs Intravenous Contrast Given 01/14/22 1450)    ED Course/ Medical Decision Making/ A&P Clinical Course as of 01/14/22 1627  Mon Jan 14, 2022  1536 CT scan of the chest without PE or dissection [JK]  1537 CBC(!) normal [JK]  12035Basic metabolic panel(!) normal [JK]  1537 Troponin I (High Sensitivity) normal [JK]    Clinical Course User Index [JK] KDorie Rank MD   HEAR Score: 4                       Medical Decision Making Amount and/or Complexity of Data Reviewed Labs: ordered. Decision-making details documented in ED Course. Radiology: ordered.  Risk Prescription drug management.  Pt presented to the ED with chest pain.  ED workup  reassuring.  Serial troponin normal.  Moderate risk score.  Concern for PE so CT scan performed.  Negative for PE or dissection.  DIscussed with patient reassuring workup.  Plan for discharge.  Then patient told nurse she is suicidal and wants  to overdose on pills.  Will consult tts.  Pt medically cleared.        Final Clinical Impression(s) / ED Diagnoses Final diagnoses:  Chest pain, unspecified type  Suicidal ideation    Rx / DC Orders ED Discharge Orders     None         Dorie Rank, MD 01/14/22 1627

## 2022-01-15 DIAGNOSIS — F1494 Cocaine use, unspecified with cocaine-induced mood disorder: Secondary | ICD-10-CM

## 2022-01-15 MED ORDER — DIPHENHYDRAMINE HCL 25 MG PO CAPS
25.0000 mg | ORAL_CAPSULE | Freq: Once | ORAL | Status: AC
  Administered 2022-01-15: 25 mg via ORAL
  Filled 2022-01-15: qty 1

## 2022-01-15 MED ORDER — LAMOTRIGINE 25 MG PO TABS
25.0000 mg | ORAL_TABLET | Freq: Every day | ORAL | Status: DC
  Administered 2022-01-15: 25 mg via ORAL
  Filled 2022-01-15: qty 1

## 2022-01-15 MED ORDER — QUETIAPINE FUMARATE 50 MG PO TABS
150.0000 mg | ORAL_TABLET | Freq: Every day | ORAL | Status: DC
  Filled 2022-01-15: qty 3

## 2022-01-15 MED ORDER — MIRTAZAPINE 15 MG PO TABS
15.0000 mg | ORAL_TABLET | Freq: Every day | ORAL | Status: DC
  Filled 2022-01-15: qty 1

## 2022-01-15 MED ORDER — BENZONATATE 100 MG PO CAPS
100.0000 mg | ORAL_CAPSULE | Freq: Once | ORAL | Status: AC
  Administered 2022-01-15: 100 mg via ORAL
  Filled 2022-01-15: qty 1

## 2022-01-15 MED ORDER — THIAMINE HCL 100 MG PO TABS
100.0000 mg | ORAL_TABLET | Freq: Every day | ORAL | Status: DC
  Administered 2022-01-15: 100 mg via ORAL
  Filled 2022-01-15: qty 1

## 2022-01-15 NOTE — Discharge Instructions (Signed)
Patient has been recommended to established outpatient treatment with one of the following agencies:  ? ?[facility]  ?[place], [place]  ?[phone] ? ?[facility] ?[place], [place], [place]  ?[phone] ? ?[facility]  ?[address], [place], [place]  ?[place] ?

## 2022-01-15 NOTE — Progress Notes (Signed)
No covid resources at this time.  ?

## 2022-01-15 NOTE — Consult Note (Signed)
Telepsych Consultation   Reason for Consult:   Psychiatric Reassessment Referring Physician:  Dr. Dorie Rank Location of Patient:    [facility] Location of Provider: Other: Virtual home office  Patient Identification: Denise Knight MRN:  [id number] Principal Diagnosis: Cocaine-induced mood disorder ([place]) Diagnosis:  Principal Problem:   Cocaine-induced mood disorder ([place]) Active Problems:   Alcohol use disorder, moderate, dependence ([place])   Bipolar II disorder, most recent episode major depressive ([place])   Total Time spent with patient: 30 minutes  Subjective:   Denise Knight is a 55 y.o. female patient with hx of polysubstance abuse, bipolar 2 disorder, HTN reports to ED for evaluation of chest pain.  Patient was evaluated and cleared medically.  At the time of discharge she endorses suicidal ideations. When asked how she's doing today, patient states, "I'm alright but my body aches."  HPI:   Patient seen via telepsych by this provider; chart reviewed and consulted with Dr. Dwyane Dee on 01/15/22.  On evaluation Denise Knight reports passive suicidal ideations, no active plan or intent. States mentally she's feeling better since resting and waning effects of drugs but does report being tired, "because of coivd." Intermittently coughs but no apparent distress.  Pt reports hx of bipolar disorder but does not take her psychiatric medications daily.  Self medicates with crack cocaine and alcohol.  Endorses suicidal ideations after use but once she clears up she no longer feels this way. Which patient tells me is the case today.  She is future oriented,contracts for safety and lists her children as protective factors against self harm. States she is able to reach out for help if this changes.    Pt dx with covid on 3/6, states she did not know she had covid but has been symptomatic, "feeling tired and coughing" since 3/7, prior to admission was staying at a friend's house.  She  was initially approved for transfer to [facility], but this is no longer an option due to her active covid.  PHP is not at option as this is reserved for indigent patients and she has [employer].  However patient agrees to continue outpatient follow-up for SUD and mental health care.  Patient initially planned to return to her daughter's house at discharge but in lieu of covid diagnosis she has to make other arrangements.   Since admission, she's been medically cleared and restarted on lamotrigne 63m po daily, mirtazapine 137mpo qhs and quetiapine 15052mo qhs and thiamine 100m62m daily.  Tolerated medications well, no side effects.  Sleep and appetite are both good.     Per ED Provider Admission Assessment 01/14/2022: Chief Complaint  Patient presents with   Chest Pain      Denise Knight 55 y2. female.     Chest Pain   HPI: A 55 y36r old patient with a history of hypertension and hypercholesterolemia presents for evaluation of chest pain. Initial onset of pain was approximately 1-3 hours ago. The patient's chest pain is described as heaviness/pressure/tightness and is not worse with exertion. The patient complains of nausea. The patient's chest pain is middle- or left-sided, is not well-localized, is not sharp and does not radiate to the arms/jaw/neck. The patient denies diaphoresis. The patient has smoked in the past 90 days. The patient has no history of stroke, has no history of peripheral artery disease, denies any history of treated diabetes, has no relevant family history of coronary artery disease (first degree relative at less than age  55) and does not have an elevated BMI (>=30).    Past Psychiatric History: as outlined above  Risk to Self:  no Risk to Others:  no Prior Inpatient Therapy:  no Prior Outpatient Therapy:  yes  Past Medical History:  Past Medical History:  Diagnosis Date   Allergy    seasonal   Anxiety    Arthritis    "left knee" (07/05/2016)   Asthma     Bipolar 1 disorder ([place])    Depression    Diabetes mellitus without complication (HCC)    pre-   GERD (gastroesophageal reflux disease)    Hypertension    MI (mitral incompetence)    Post traumatic stress disorder    Schizophrenia ([place])    Stomach ulcer    Substance abuse ([place])    cocaine quit july 2020   Suicide attempt [facility])     Past Surgical History:  Procedure Laterality Date   CARDIAC CATHETERIZATION  07/05/2016   CARDIAC CATHETERIZATION N/A 07/05/2016   Procedure: Left Heart Cath and Coronary Angiography;  Surgeon: Adrian Prows, MD;  Location: [place] [facility];  Service: Cardiovascular;  Laterality: N/A;   CYST EXCISION Right    "wrist"   DILATION AND CURETTAGE OF UTERUS     FOOT SURGERY Bilateral    "corns removed"   LEFT HEART CATH AND CORONARY ANGIOGRAPHY N/A 12/24/2017   Procedure: LEFT HEART CATH AND CORONARY ANGIOGRAPHY;  Surgeon: Nigel Mormon, MD;  Location: [place];  Service: Cardiovascular;  Laterality: N/A;   TUBAL LIGATION     Family History:  Family History  Problem Relation Age of Onset   Breast cancer Other    Lung cancer Other    Congestive Heart Failure Other    Diabetes Other    Hypertension Other    Colon cancer Neg Hx    Colon polyps Neg Hx    Esophageal cancer Neg Hx    Stomach cancer Neg Hx    Rectal cancer Neg Hx    Family Psychiatric  History: unknown Social History:  Social History   Substance and Sexual Activity  Alcohol Use Yes   Alcohol/week: 7.0 standard drinks   Types: 7 Cans of beer per week     Social History   Substance and Sexual Activity  Drug Use Yes   Types: Marijuana    Social History   Socioeconomic History   Marital status: Widowed    Spouse name: Not on file   Number of children: 2   Years of education: Not on file   Highest education level: Not on file  Occupational History   Occupation: disabled   Tobacco Use   Smoking status: Some Days    Packs/day: 0.25    Years: 35.00    Pack  years: 8.75    Types: Cigarettes    Last attempt to quit: 05/30/2019    Years since quitting: 2.6   Smokeless tobacco: Never  Vaping Use   Vaping Use: Never used  Substance and Sexual Activity   Alcohol use: Yes    Alcohol/week: 7.0 standard drinks    Types: 7 Cans of beer per week   Drug use: Yes    Types: Marijuana   Sexual activity: Yes    Birth control/protection: Surgical  Other Topics Concern   Not on file  Social History Narrative   Not on file   Social Determinants of Health   Financial Resource Strain: Not on file  Food Insecurity: Not on file  Transportation Needs: Not on file  Physical Activity: Not on file  Stress: Not on file  Social Connections: Not on file   Additional Social History:    Allergies:   Allergies  Allergen Reactions   Aspirin Hives   Food Hives    "Regular butter"   Morphine And Related Hives    Labs:  Results for orders placed or performed during the hospital encounter of 01/14/22 (from the past 48 hour(s))  Basic metabolic panel     Status: Abnormal   Collection Time: 01/14/22 12:04 PM  Result Value Ref Range   Sodium 136 135 - 145 mmol/L   Potassium 3.7 3.5 - 5.1 mmol/L   Chloride 102 98 - 111 mmol/L   CO2 20 (L) 22 - 32 mmol/L   Glucose, Bld 75 70 - 99 mg/dL    Comment: Glucose reference range applies only to samples taken after fasting for at least 8 hours.   BUN 8 6 - 20 mg/dL   Creatinine, Ser 0.89 0.44 - 1.00 mg/dL   Calcium 9.5 8.9 - 10.3 mg/dL   GFR, Estimated >60 >60 mL/min    Comment: (NOTE) Calculated using the CKD-EPI Creatinine Equation (2021)    Anion gap 14 5 - 15    Comment: Performed at [facility] [address] [place], [place] [address]  CBC     Status: Abnormal   Collection Time: 01/14/22 12:04 PM  Result Value Ref Range   WBC 11.2 (H) 4.0 - 10.5 K/uL   RBC 5.23 (H) 3.87 - 5.11 MIL/uL   Hemoglobin 13.4 12.0 - 15.0 g/dL   HCT 41.9 36.0 - 46.0 %   MCV 80.1 80.0 - 100.0 fL   MCH 25.6 (L) 26.0  - 34.0 pg   MCHC 32.0 30.0 - 36.0 g/dL   RDW 14.3 11.5 - 15.5 %   Platelets 350 150 - 400 K/uL   nRBC 0.0 0.0 - 0.2 %    Comment: Performed at [facility], [place] [address] [place], [place] [address]  Troponin I (High Sensitivity)     Status: None   Collection Time: 01/14/22 12:04 PM  Result Value Ref Range   Troponin I (High Sensitivity) 12 <18 ng/L    Comment: (NOTE) Elevated high sensitivity troponin I (hsTnI) values and significant  changes across serial measurements may suggest ACS but many other  chronic and acute conditions are known to elevate hsTnI results.  Refer to the "Links" section for chest pain algorithms and additional  guidance. Performed at [facility], [place] [address], [place], [place] [address]   Troponin I (High Sensitivity)     Status: None   Collection Time: 01/14/22  2:54 PM  Result Value Ref Range   Troponin I (High Sensitivity) 11 <18 ng/L    Comment: (NOTE) Elevated high sensitivity troponin I (hsTnI) values and significant  changes across serial measurements may suggest ACS but many other  chronic and acute conditions are known to elevate hsTnI results.  Refer to the "Links" section for chest pain algorithms and additional  guidance. Performed at [facility], [place] [address], [place], [place] [address]   Resp Panel by RT-PCR (Flu A&B, Covid) Nasopharyngeal Swab     Status: Abnormal   Collection Time: 01/14/22  4:20 PM   Specimen: Nasopharyngeal Swab; Nasopharyngeal(NP) swabs in vial transport medium  Result Value Ref Range   SARS Coronavirus 2 by RT PCR POSITIVE (A) NEGATIVE    Comment: (  NOTE) SARS-CoV-2 target nucleic acids are DETECTED.  The SARS-CoV-2 RNA is generally detectable in upper respiratory specimens during the acute phase of infection. Positive results are indicative of the presence of the identified virus, but do not rule out bacterial infection or co-infection with other pathogens not detected by the test.  Clinical correlation with patient history and other diagnostic information is necessary to determine patient infection status. The expected result is Negative.  Fact Sheet for Patients: EntrepreneurPulse.com.au  Fact Sheet for Healthcare Providers: IncredibleEmployment.be  This test is not yet approved or cleared by the [place] FDA and  has been authorized for detection and/or diagnosis of SARS-CoV-2 by FDA under an Emergency Use Authorization (EUA).  This EUA will remain in effect (meaning this test can be used) for the duration of  the COVID-19 declaration under Section 564(b)(1) of the A ct, 21 U.S.C. section 360bbb-3(b)(1), unless the authorization is terminated or revoked sooner.     Influenza A by PCR NEGATIVE NEGATIVE   Influenza B by PCR NEGATIVE NEGATIVE    Comment: (NOTE) The Xpert Xpress SARS-CoV-2/FLU/RSV plus assay is intended as an aid in the diagnosis of influenza from Nasopharyngeal swab specimens and should not be used as a sole basis for treatment. Nasal washings and aspirates are unacceptable for Xpert Xpress SARS-CoV-2/FLU/RSV testing.  Fact Sheet for Patients: EntrepreneurPulse.com.au  Fact Sheet for Healthcare Providers: IncredibleEmployment.be  This test is not yet approved or cleared by the [place] FDA and has been authorized for detection and/or diagnosis of SARS-CoV-2 by FDA under an Emergency Use Authorization (EUA). This EUA will remain in effect (meaning this test can be used) for the duration of the COVID-19 declaration under Section 564(b)(1) of the Act, 21 U.S.C. section 360bbb-3(b)(1), unless the authorization is terminated or revoked.  Performed at [facility], [place] [address], [place], [place] [address]   Rapid urine drug screen (hospital performed)     Status: Abnormal   Collection Time: 01/14/22  6:36 PM  Result Value Ref Range   Opiates NONE  DETECTED NONE DETECTED   Cocaine POSITIVE (A) NONE DETECTED   Benzodiazepines NONE DETECTED NONE DETECTED   Amphetamines NONE DETECTED NONE DETECTED   Tetrahydrocannabinol NONE DETECTED NONE DETECTED   Barbiturates NONE DETECTED NONE DETECTED    Comment: (NOTE) DRUG SCREEN FOR MEDICAL PURPOSES ONLY.  IF CONFIRMATION IS NEEDED FOR ANY PURPOSE, NOTIFY LAB WITHIN 5 DAYS.  LOWEST DETECTABLE LIMITS FOR URINE DRUG SCREEN Drug Class                     Cutoff (ng/mL) Amphetamine and metabolites    1000 Barbiturate and metabolites    200 Benzodiazepine                 063 Tricyclics and metabolites     300 Opiates and metabolites        300 Cocaine and metabolites        300 THC                            50 Performed at [facility], [place] [address] [place], [place] [address]   Ethanol     Status: None   Collection Time: 01/14/22  8:15 PM  Result Value Ref Range   Alcohol, Ethyl (B) <10 <10 mg/dL    Comment: (NOTE) Lowest detectable limit for serum alcohol is 10 mg/dL.  For medical purposes only. Performed  at [facility], [place] [facility] [address], [place], [place] [address]     Medications:  Current Facility-Administered Medications  Medication Dose Route Frequency Provider Last Rate Last Admin   lamoTRIgine (LAMICTAL) tablet 25 mg  25 mg Oral Daily Merlyn Lot E, NP   25 mg at 01/15/22 1204   mirtazapine (REMERON) tablet 15 mg  15 mg Oral QHS Merlyn Lot E, NP       QUEtiapine (SEROQUEL) tablet 150 mg  150 mg Oral QHS Merlyn Lot E, NP       thiamine tablet 100 mg  100 mg Oral Daily Merlyn Lot E, NP   100 mg at 01/15/22 1205   Current Outpatient Medications  Medication Sig Dispense Refill   albuterol (VENTOLIN HFA) 108 (90 Base) MCG/ACT inhaler Inhale 1-2 puffs into the lungs every 6 (six) hours as needed for wheezing or shortness of breath. 1 each 0   amLODipine (NORVASC) 10 MG tablet Take 10 mg by mouth every morning.     fluticasone (FLOVENT HFA) 110 MCG/ACT  inhaler Inhale 1 puff into the lungs 2 (two) times daily. 1 each 0   gabapentin (NEURONTIN) 100 MG capsule Take 1 capsule (100 mg total) by mouth 3 (three) times daily. 90 capsule 0   loratadine (CLARITIN) 10 MG tablet Take 1 tablet (10 mg total) by mouth daily. 30 tablet 0   Melatonin 10 MG TABS Take 10 mg by mouth at bedtime. 30 tablet 0   pantoprazole (PROTONIX) 40 MG tablet Take 1 tablet (40 mg total) by mouth every morning. 30 tablet 0   Pseudoephedrine-DM-GG (SUDAFED COUGH PO) Take 1 capsule by mouth daily as needed (Allergies).     bisacodyl (DULCOLAX) 5 MG EC tablet Take 2 tablets (10 mg total) by mouth daily as needed for moderate constipation. (Patient not taking: Reported on 01/14/2022) 30 tablet 0   folic acid (FOLVITE) 1 MG tablet Take 1 tablet (1 mg total) by mouth daily. (Patient not taking: Reported on 01/14/2022) 30 tablet 0   guaiFENesin (ROBITUSSIN) 100 MG/5ML liquid Take 10 mLs by mouth every 4 (four) hours as needed for to loosen phlegm. (Patient not taking: Reported on 01/14/2022) 120 mL 0   hydrOXYzine (ATARAX/VISTARIL) 25 MG tablet Take 1 tablet (25 mg total) by mouth 3 (three) times daily as needed for anxiety. (Patient not taking: Reported on 01/14/2022) 30 tablet 0   lamoTRIgine (LAMICTAL) 25 MG tablet Take 1 tablet (25 mg total) by mouth daily. (Patient not taking: Reported on 01/14/2022) 30 tablet 0   mirtazapine (REMERON) 15 MG tablet Take 1 tablet (15 mg total) by mouth at bedtime. (Patient not taking: Reported on 01/14/2022) 30 tablet 0   Multiple Vitamin (MULTIVITAMIN WITH MINERALS) TABS tablet Take 1 tablet by mouth daily. (Patient not taking: Reported on 01/14/2022) 30 tablet    mupirocin ointment (BACTROBAN) 2 % Place into the nose 2 (two) times daily. Apply in Nose 2 times daily until 09/27/21. (Patient not taking: Reported on 01/14/2022) 22 g 0   nicotine polacrilex (NICORETTE) 2 MG gum Take 1 each (2 mg total) by mouth as needed for smoking cessation. (Patient not taking:  Reported on 01/14/2022) 100 tablet 0   QUEtiapine Fumarate 150 MG TABS Take 150 mg by mouth at bedtime. (Patient not taking: Reported on 01/14/2022) 30 tablet 0   simvastatin (ZOCOR) 40 MG tablet Take 1 tablet (40 mg total) by mouth daily with supper. (Patient not taking: Reported on 01/14/2022) 30 tablet 0   sodium  chloride (OCEAN) 0.65 % SOLN nasal spray Place 1 spray into both nostrils as needed for congestion. (Patient not taking: Reported on 01/14/2022) 15 mL 0   thiamine 100 MG tablet Take 1 tablet (100 mg total) by mouth daily. (Patient not taking: Reported on 01/14/2022) 30 tablet 0   vitamin B-12 (CYANOCOBALAMIN) 1000 MCG tablet Take 1 tablet (1,000 mcg total) by mouth daily. (Patient not taking: Reported on 01/14/2022) 30 tablet 0   Vitamin D, Ergocalciferol, (DRISDOL) 1.25 MG (50000 UNIT) CAPS capsule Take 1 capsule (50,000 Units total) by mouth every 7 (seven) days. (Patient not taking: Reported on 01/14/2022) 5 capsule 0    Musculoskeletal: Strength & Muscle Tone: within normal limits Gait & Station: normal Patient leans: N/A   Psychiatric Specialty Exam:  Presentation  General Appearance: Fairly Groomed (appears tired and coughing during assessment)  Eye Contact:Good  Speech:Clear and Coherent; Normal Rate  Speech Volume:Normal  Handedness:Right   Mood and Affect  Mood:Dysphoric (patient endorses "feeling tired and I have body aches")  Affect:Congruent   Thought Process  Thought Processes:Coherent  Descriptions of Associations:Intact  Orientation:Full (Time, Place and Person)  Thought Content:Logical (has improves since admission)  History of Schizophrenia/Schizoaffective disorder:Yes  Duration of Psychotic Symptoms:No data recorded Hallucinations:Hallucinations: None  Ideas of Reference:None  Suicidal Thoughts:Suicidal Thoughts: Yes, Passive SI Passive Intent and/or Plan: Without Intent; Without Plan; Without Means to Carry Out  Homicidal Thoughts:Homicidal  Thoughts: No   Sensorium  Memory:Immediate Good; Recent Good; Remote Good  Judgment:Good (good at the time of assessment but at baseline is impulsive based on illicit substance use and not taking mental health medications)  Insight:Fair   Executive Functions  Concentration:Fair (because she is sick and coughing)  Attention Span:Fair  [place]  Language:Good   Psychomotor Activity  Psychomotor Activity:Psychomotor Activity: Normal   Assets  Assets:Communication Skills; Desire for Improvement; Social Support   Sleep  Sleep:Sleep: Fair Number of Hours of Sleep: 7    Physical Exam: Physical Exam Musculoskeletal:        General: Normal range of motion.     Cervical back: Normal range of motion.  Neurological:     General: No focal deficit present.     Mental Status: She is alert and oriented to person, place, and time.  Psychiatric:        Attention and Perception: Attention and perception normal.        Mood and Affect: Mood is anxious (mildly anxious but states this is because she does not feel well, has covid).        Speech: Speech normal.        Behavior: Behavior normal. Behavior is cooperative.        Thought Content: Thought content is not paranoid or delusional. Thought content includes suicidal (passive suicidal ideations) ideation. Thought content does not include homicidal ideation. Thought content does not include homicidal or suicidal plan.        Cognition and Memory: Cognition and memory normal.        Judgment: Judgment normal.   Review of Systems  Constitutional:  Positive for malaise/fatigue (reports this secondary to covid).  HENT: Negative.    Respiratory:  Positive for cough (patient dx with covid).   Cardiovascular:  Positive for chest pain (was present on admission, evaluated and medically cleared).  Gastrointestinal: Negative.   Genitourinary: Negative.   Musculoskeletal: Negative.   Skin: Negative.    Neurological:  Positive for dizziness, tremors and headaches.  Endo/Heme/Allergies: Negative.  Psychiatric/Behavioral:  Positive for substance abuse and suicidal ideas. Negative for hallucinations and memory loss. The patient does not have insomnia.   Blood pressure 127/87, pulse (!) 101, temperature 98.5 F (36.9 C), temperature source Oral, resp. rate 20, SpO2 96 %. There is no height or weight on file to calculate BMI.  Treatment Plan Summary: Patient with passive suicidal ideations, no plan or intent, contracts for safety.  She is clear and coherent, no psychotic symptoms seen today.  Patient was previously recommended for [facility] but in lieu of her + covid test, she agrees to outpatient follow-up with PHP.  She was restarted on psychiatric medications, states she has refills available at home and does not need renewals today. She tells me she can follow-up with outpatient provider for continued medication management and therapy.  TOC consult was entered for homeless resources.    Plan- As per above assessment, there are no current grounds for involuntary commitment at this time.  Patient is not currently interested in inpatient services but expresses agreement to continue outpatient treatment., we have reviewed importance of substance abuse abstinence, potential negative impact substance abuse can have on his relationships and level of functioning, and importance of medication compliance.  Disposition: No evidence of imminent risk to self or others at present.   Patient does not meet criteria for psychiatric inpatient admission. Supportive therapy provided about ongoing stressors. Discussed crisis plan, support from social network, calling 911, coming to the Emergency Department, and calling Suicide Hotline.  This service was provided via telemedicine using a 2-way, interactive audio and video technology.  Names of all persons participating in this telemedicine service and their role in this  encounter. Name: Denise Knight Role: patient  Name: Merlyn Lot Role: PMHNP  Name: Dr. Hampton Abbot Role: Psychiatrist    Mallie Darting, NP 01/15/2022 3:17 PM

## 2022-01-15 NOTE — ED Provider Notes (Signed)
Emergency Medicine Observation Re-evaluation Note ? ?Denise Knight is a 56 y.o. female, seen on rounds today.  Pt initially presented to the ED for complaints of Chest Pain ?Currently, the patient is resting quietly. ? ?Physical Exam  ?BP (!) 112/94   Pulse 98   Temp 98.5 ?F (36.9 ?C) (Oral)   Resp 18   SpO2 97%  ?Physical Exam ?General: No acute distress ?Cardiac: Well-perfused ?Lungs: Nonlabored ?Psych: No agitation ? ?ED Course / MDM  ?EKG:EKG Interpretation ? ?Date/Time:  Monday January 14 2022 12:16:00 EST ?Ventricular Rate:  108 ?PR Interval:  140 ?QRS Duration: 60 ?QT Interval:  356 ?QTC Calculation: 477 ?R Axis:   49 ?Text Interpretation: Sinus tachycardia Cannot rule out Inferior infarct , age undetermined Abnormal ECG When compared with ECG of 24-Sep-2021 16:25, PREVIOUS ECG IS PRESENT Since last tracing rate faster Confirmed by Dorie Rank [phone]) on 01/14/2022 1:51:50 PM ? ?I have reviewed the labs performed to date as well as medications administered while in observation.  Recent changes in the last 24 hours include TTS evaluation.  Incidental COVID-positive ?Plan  ?Current plan is for psychiatric disposition. ? Denise Knight is not under involuntary commitment. ? ?11:30 a.m.-patient psychiatrically cleared for outpatient follow-up.  TOC to help assist with resources. ? ? ?  ?Hayden Rasmussen, MD ?01/16/22 [phone] ? ?

## 2022-01-15 NOTE — ED Notes (Signed)
Gave patient a [place] sandwich and a ginger ale patient is resting with call bell in reach ?

## 2022-01-15 NOTE — ED Notes (Signed)
Breakfast Order placed ?

## 2022-01-17 ENCOUNTER — Emergency Department (HOSPITAL_COMMUNITY)
Admission: EM | Admit: 2022-01-17 | Discharge: 2022-01-21 | Disposition: A | Discharge status: Other Acute Inpatient Hospital | Attending: Emergency Medicine | Admitting: Emergency Medicine

## 2022-01-17 ENCOUNTER — Encounter (HOSPITAL_COMMUNITY): Payer: Self-pay | Admitting: Emergency Medicine

## 2022-01-17 ENCOUNTER — Other Ambulatory Visit: Payer: Self-pay

## 2022-01-17 DIAGNOSIS — Z79899 Other long term (current) drug therapy: Secondary | ICD-10-CM | POA: Insufficient documentation

## 2022-01-17 DIAGNOSIS — N9489 Other specified conditions associated with female genital organs and menstrual cycle: Secondary | ICD-10-CM | POA: Insufficient documentation

## 2022-01-17 DIAGNOSIS — I10 Essential (primary) hypertension: Secondary | ICD-10-CM | POA: Insufficient documentation

## 2022-01-17 DIAGNOSIS — E119 Type 2 diabetes mellitus without complications: Secondary | ICD-10-CM | POA: Insufficient documentation

## 2022-01-17 DIAGNOSIS — Z046 Encounter for general psychiatric examination, requested by authority: Secondary | ICD-10-CM | POA: Diagnosis present

## 2022-01-17 DIAGNOSIS — F1414 Cocaine abuse with cocaine-induced mood disorder: Secondary | ICD-10-CM | POA: Insufficient documentation

## 2022-01-17 DIAGNOSIS — F3181 Bipolar II disorder: Secondary | ICD-10-CM | POA: Insufficient documentation

## 2022-01-17 DIAGNOSIS — R45851 Suicidal ideations: Secondary | ICD-10-CM | POA: Diagnosis not present

## 2022-01-17 DIAGNOSIS — J45909 Unspecified asthma, uncomplicated: Secondary | ICD-10-CM | POA: Insufficient documentation

## 2022-01-17 DIAGNOSIS — U071 COVID-19: Secondary | ICD-10-CM | POA: Diagnosis not present

## 2022-01-17 DIAGNOSIS — F1494 Cocaine use, unspecified with cocaine-induced mood disorder: Secondary | ICD-10-CM | POA: Diagnosis present

## 2022-01-17 DIAGNOSIS — F1410 Cocaine abuse, uncomplicated: Secondary | ICD-10-CM | POA: Diagnosis present

## 2022-01-17 LAB — COMPREHENSIVE METABOLIC PANEL
ALT: 22 U/L (ref 0–44)
AST: 22 U/L (ref 15–41)
Albumin: 3.6 g/dL (ref 3.5–5.0)
Alkaline Phosphatase: 65 U/L (ref 38–126)
Anion gap: 10 (ref 5–15)
BUN: 9 mg/dL (ref 6–20)
CO2: 25 mmol/L (ref 22–32)
Calcium: 9.1 mg/dL (ref 8.9–10.3)
Chloride: 107 mmol/L (ref 98–111)
Creatinine, Ser: 1.33 mg/dL — ABNORMAL HIGH (ref 0.44–1.00)
GFR, Estimated: 47 mL/min — ABNORMAL LOW (ref >60)
Glucose, Bld: 82 mg/dL (ref 70–99)
Potassium: 3.7 mmol/L (ref 3.5–5.1)
Sodium: 142 mmol/L (ref 135–145)
Total Bilirubin: 0.3 mg/dL (ref 0.3–1.2)
Total Protein: 7.1 g/dL (ref 6.5–8.1)

## 2022-01-17 LAB — CBC
HCT: 42.3 % (ref 36.0–46.0)
Hemoglobin: 13 g/dL (ref 12.0–15.0)
MCH: 25.9 pg — ABNORMAL LOW (ref 26.0–34.0)
MCHC: 30.7 g/dL (ref 30.0–36.0)
MCV: 84.3 fL (ref 80.0–100.0)
Platelets: 343 10*3/uL (ref 150–400)
RBC: 5.02 MIL/uL (ref 3.87–5.11)
RDW: 14.7 % (ref 11.5–15.5)
WBC: 10 10*3/uL (ref 4.0–10.5)
nRBC: 0 % (ref 0.0–0.2)

## 2022-01-17 LAB — RAPID URINE DRUG SCREEN, HOSP PERFORMED
Amphetamines: NOT DETECTED
Barbiturates: NOT DETECTED
Benzodiazepines: NOT DETECTED
Cocaine: POSITIVE — AB
Opiates: NOT DETECTED
Tetrahydrocannabinol: POSITIVE — AB

## 2022-01-17 LAB — I-STAT BETA HCG BLOOD, ED (MC, WL, AP ONLY): I-stat hCG, quantitative: <5 m[IU]/mL (ref <5)

## 2022-01-17 LAB — ETHANOL: Alcohol, Ethyl (B): <10 mg/dL (ref <10)

## 2022-01-17 LAB — RESP PANEL BY RT-PCR (FLU A&B, COVID) ARPGX2
Influenza A by PCR: NEGATIVE
Influenza B by PCR: NEGATIVE
SARS Coronavirus 2 by RT PCR: POSITIVE — AB

## 2022-01-17 LAB — ACETAMINOPHEN LEVEL: Acetaminophen (Tylenol), Serum: <10 ug/mL — ABNORMAL LOW (ref 10–30)

## 2022-01-17 LAB — SALICYLATE LEVEL: Salicylate Lvl: <7 mg/dL — ABNORMAL LOW (ref 7.0–30.0)

## 2022-01-17 NOTE — ED Provider Triage Note (Signed)
Emergency Medicine Provider Triage Evaluation Note ? ?Denise Knight , a 56 y.o. female  was evaluated in triage.  Pt complains of suicidal ideations.  Patient states that she is having thoughts about hurting herself.  She states that she plans to overdose on pills at home.  She has been seen previously for suicidal ideations.  States that she was taking medication for her anxiety and depression however stopped them 6 months ago because she was feeling better.  She denies homicidal ideation, AVH.. ? ?Review of Systems  ?Positive: See above ?Negative:  ? ?Physical Exam  ?BP (!) 128/92 (BP Location: Right Arm)   Pulse 95   Temp 98.3 ?F (36.8 ?C) (Oral)   Resp 18   SpO2 100%  ?Gen:   Awake, crying throughout exam ?Resp:  Normal effort  ?MSK:   Moves extremities without difficulty  ?Other:   ? ?Medical Decision Making  ?Medically screening exam initiated at 6:11 PM.  Appropriate orders placed.  Denise Knight was informed that the remainder of the evaluation will be completed by another provider, this initial triage assessment does not replace that evaluation, and the importance of remaining in the ED until their evaluation is complete. ? ? ?  ?Denise Hillier, PA-C ?01/17/22 1833 ? ?

## 2022-01-17 NOTE — ED Notes (Signed)
Pt's belongings (4 bags total) placed in L[facility] [address] ?

## 2022-01-17 NOTE — ED Notes (Signed)
Security wanded pt ?

## 2022-01-17 NOTE — ED Notes (Signed)
Pt dressed out into purple scrubs.  ?

## 2022-01-17 NOTE — ED Triage Notes (Signed)
Pt here wanting to speak to a psychiatrist. Pt reports suicidal ideations x3 days, pt has hx of depression but has been off anti-depressants for at least 6 months. Pt has plan to OD on pills. ?

## 2022-01-17 NOTE — BH Assessment (Signed)
Clinician messaged Prudy Feeler. Hall Busing, RN: "Hey. It's Trey with TTS. Is the pt able to engage in the assessment, if so the pt will need to be placed in a private room. Also is the pt under IVC?"  ? ?Clinician awaiting response.  ? ? ?Vertell Novak, MS, [facility] ?Triage Specialist ?[phone] ? ? ?

## 2022-01-17 NOTE — ED Provider Notes (Signed)
[facility] EMERGENCY DEPARTMENT Provider Note   CSN: [id number] Arrival date & time: 01/17/22  1714     History  Chief Complaint  Patient presents with   Suicidal    Denise Knight is a 56 y.o. female.  HPI     57yo female with history of bipolar, htn, depression, schizophrenia, prior suicide attempt, presents with concern for suicidal ideation.  Reports worsening depression and over the last 3 days has had suicidal thoughts. Reports she has plan to get pills and overdose.  Knows how she would do it.  Is tearful. Reports if she leaves she will kill herself.  Denies acute medical concerns. Reports she had symptoms starting 6 days ago, was seen in the ED 3/6 and diagnosed with COVID with occasional cough, fatigue. Feels those symptoms have resolved  No fever, nausea, vomiting, diarrhea.  Uses cocaine and etoh, denies hx of withdrawal.  Past Medical History:  Diagnosis Date   Allergy    seasonal   Anxiety    Arthritis    "left knee" (07/05/2016)   Asthma    Bipolar 1 disorder ([place])    Depression    Diabetes mellitus without complication (HCC)    pre-   GERD (gastroesophageal reflux disease)    Hypertension    MI (mitral incompetence)    Post traumatic stress disorder    Schizophrenia ([place])    Stomach ulcer    Substance abuse ([place])    cocaine quit july 2020   Suicide attempt [facility])      Home Medications Prior to Admission medications   Medication Sig Start Date End Date Taking? Authorizing Provider  albuterol (VENTOLIN HFA) 108 (90 Base) MCG/ACT inhaler Inhale 1-2 puffs into the lungs every 6 (six) hours as needed for wheezing or shortness of breath. Patient not taking: Reported on 01/18/2022 09/25/21   Armando Reichert, MD  amLODipine (NORVASC) 10 MG tablet Take 10 mg by mouth every morning. Patient not taking: Reported on 01/18/2022 05/03/21   [provider]  bisacodyl (DULCOLAX) 5 MG EC tablet Take 2 tablets (10 mg total) by mouth  daily as needed for moderate constipation. Patient not taking: Reported on 01/14/2022 09/25/21   Armando Reichert, MD  fluticasone (FLOVENT HFA) 110 MCG/ACT inhaler Inhale 1 puff into the lungs 2 (two) times daily. Patient not taking: Reported on 01/18/2022 09/25/21   Armando Reichert, MD  folic acid (FOLVITE) 1 MG tablet Take 1 tablet (1 mg total) by mouth daily. Patient not taking: Reported on 01/14/2022 09/25/21   Armando Reichert, MD  gabapentin (NEURONTIN) 100 MG capsule Take 1 capsule (100 mg total) by mouth 3 (three) times daily. Patient not taking: Reported on 01/18/2022 09/25/21   Armando Reichert, MD  guaiFENesin (ROBITUSSIN) 100 MG/5ML liquid Take 10 mLs by mouth every 4 (four) hours as needed for to loosen phlegm. Patient not taking: Reported on 01/14/2022 09/25/21   Armando Reichert, MD  hydrOXYzine (ATARAX/VISTARIL) 25 MG tablet Take 1 tablet (25 mg total) by mouth 3 (three) times daily as needed for anxiety. Patient not taking: Reported on 01/14/2022 09/25/21   Armando Reichert, MD  lamoTRIgine (LAMICTAL) 25 MG tablet Take 1 tablet (25 mg total) by mouth daily. Patient not taking: Reported on 01/14/2022 09/25/21 10/25/21  Armando Reichert, MD  loratadine (CLARITIN) 10 MG tablet Take 1 tablet (10 mg total) by mouth daily. Patient not taking: Reported on 01/18/2022 09/25/21   Armando Reichert, MD  Melatonin 10 MG TABS Take 10 mg by mouth at  bedtime. Patient not taking: Reported on 01/18/2022 09/25/21   Armando Reichert, MD  mirtazapine (REMERON) 15 MG tablet Take 1 tablet (15 mg total) by mouth at bedtime. Patient not taking: Reported on 01/14/2022 09/25/21 10/25/21  Armando Reichert, MD  Multiple Vitamin (MULTIVITAMIN WITH MINERALS) TABS tablet Take 1 tablet by mouth daily. Patient not taking: Reported on 01/14/2022 09/25/21   Armando Reichert, MD  mupirocin ointment (BACTROBAN) 2 % Place into the nose 2 (two) times daily. Apply in Nose 2 times daily until 09/27/21. Patient not taking: Reported on 01/14/2022 09/27/21   Armando Reichert, MD  nicotine polacrilex (NICORETTE) 2 MG gum Take 1 each (2 mg total) by mouth as needed for smoking cessation. Patient not taking: Reported on 01/14/2022 09/25/21   Armando Reichert, MD  pantoprazole (PROTONIX) 40 MG tablet Take 1 tablet (40 mg total) by mouth every morning. Patient not taking: Reported on 01/18/2022 09/25/21   Armando Reichert, MD  Pseudoephedrine-DM-GG (SUDAFED COUGH PO) Take 1 capsule by mouth daily as needed (Allergies). Patient not taking: Reported on 01/18/2022    [provider]  QUEtiapine Fumarate 150 MG TABS Take 150 mg by mouth at bedtime. Patient not taking: Reported on 01/14/2022 09/25/21   Armando Reichert, MD  simvastatin (ZOCOR) 40 MG tablet Take 1 tablet (40 mg total) by mouth daily with supper. Patient not taking: Reported on 01/14/2022 09/25/21   Armando Reichert, MD  sodium chloride (OCEAN) 0.65 % SOLN nasal spray Place 1 spray into both nostrils as needed for congestion. Patient not taking: Reported on 01/14/2022 09/25/21   Armando Reichert, MD  thiamine 100 MG tablet Take 1 tablet (100 mg total) by mouth daily. Patient not taking: Reported on 01/14/2022 09/25/21   Armando Reichert, MD  vitamin B-12 (CYANOCOBALAMIN) 1000 MCG tablet Take 1 tablet (1,000 mcg total) by mouth daily. Patient not taking: Reported on 01/14/2022 09/25/21   Armando Reichert, MD  Vitamin D, Ergocalciferol, (DRISDOL) 1.25 MG (50000 UNIT) CAPS capsule Take 1 capsule (50,000 Units total) by mouth every 7 (seven) days. Patient not taking: Reported on 01/14/2022 09/26/21   Armando Reichert, MD  amantadine (SYMMETREL) 100 MG capsule Take 1 capsule (100 mg total) by mouth 2 (two) times daily. 01/17/20 10/13/20  Connye Burkitt, NP      Allergies    Aspirin, Food, and Morphine and related    Review of Systems   Review of Systems  Physical Exam Updated Vital Signs BP 130/86 (BP Location: Left Arm)    Pulse 81    Temp 97.6 F (36.4 C) (Oral)    Resp 18    Ht 5' 8"  (1.727 m)    Wt 88.5 kg    SpO2 99%    BMI  29.67 kg/m  Physical Exam  ED Results / Procedures / Treatments   Labs (all labs ordered are listed, but only abnormal results are displayed) Labs Reviewed  RESP PANEL BY RT-PCR (FLU A&B, COVID) ARPGX2 - Abnormal; Notable for the following components:      Result Value   SARS Coronavirus 2 by RT PCR POSITIVE (*)    All other components within normal limits  COMPREHENSIVE METABOLIC PANEL - Abnormal; Notable for the following components:   Creatinine, Ser 1.33 (*)    GFR, Estimated 47 (*)    All other components within normal limits  SALICYLATE LEVEL - Abnormal; Notable for the following components:   Salicylate Lvl <6.5 (*)    All other components within normal limits  ACETAMINOPHEN LEVEL -  Abnormal; Notable for the following components:   Acetaminophen (Tylenol), Serum <10 (*)    All other components within normal limits  CBC - Abnormal; Notable for the following components:   MCH 25.9 (*)    All other components within normal limits  RAPID URINE DRUG SCREEN, HOSP PERFORMED - Abnormal; Notable for the following components:   Cocaine POSITIVE (*)    Tetrahydrocannabinol POSITIVE (*)    All other components within normal limits  ETHANOL  I-STAT BETA HCG BLOOD, ED ([facility], [facility], [facility] ONLY)    EKG EKG Interpretation  Date/Time:  Thursday January 17 2022 20:50:55 EST Ventricular Rate:  80 PR Interval:  162 QRS Duration: 72 QT Interval:  390 QTC Calculation: 449 R Axis:   70 Text Interpretation: Normal sinus rhythm no acute ST/T changes Confirmed by Sherwood Gambler [phone]) on 01/18/2022 8:40:23 AM  Radiology No results found.  Procedures Procedures    Medications Ordered in ED Medications  amLODipine (NORVASC) tablet 5 mg (has no administration in time range)  hydrOXYzine (ATARAX) tablet 25 mg (has no administration in time range)  mirtazapine (REMERON) tablet 15 mg (has no administration in time range)  QUEtiapine (SEROQUEL) tablet 150 mg (has no administration in time range)   diphenhydrAMINE (BENADRYL) capsule 25 mg (25 mg Oral Given 01/18/22 0211)    ED Course/ Medical Decision Making/ A&P                           Medical Decision Making Amount and/or Complexity of Data Reviewed Labs: ordered.  Risk Prescription drug management.    56yo female with history of bipolar, htn, depression, schizophrenia, prior suicide attempt, presents with concern for suicidal ideation with plan to overdose.   Denies medical concerns. Tested positive for COVID-reports symptoms 6 days ago now resolved.   Labs personally reviewed by me without significant abnormalities. She is medically clared.  TTS consulted. Planning on inpatient treatment.        Final Clinical Impression(s) / ED Diagnoses Final diagnoses:  Suicidal ideation    Rx / DC Orders ED Discharge Orders     None         Gareth Morgan, MD 01/18/22 1055

## 2022-01-17 NOTE — BH Assessment (Addendum)
@  2248, Clinician followed up with patient's nurse Shirlee Limerick, RN) to request an update regarding patient's readiness to complete her initial TTS assessment. Awaiting a response.  ? ?@2252 , Clinician also notified patient's Nya, NT and EDP Gary Bing., MD) that this Clinician is available to complete patient's TTS assessment. Awaiting a response.  ?

## 2022-01-18 DIAGNOSIS — F3181 Bipolar II disorder: Secondary | ICD-10-CM

## 2022-01-18 MED ORDER — BENZONATATE 100 MG PO CAPS
200.0000 mg | ORAL_CAPSULE | Freq: Once | ORAL | Status: AC
  Administered 2022-01-18: 200 mg via ORAL
  Filled 2022-01-18: qty 2

## 2022-01-18 MED ORDER — AMLODIPINE BESYLATE 5 MG PO TABS
5.0000 mg | ORAL_TABLET | Freq: Every morning | ORAL | Status: DC
  Administered 2022-01-18 – 2022-01-21 (×4): 5 mg via ORAL
  Filled 2022-01-18 (×4): qty 1

## 2022-01-18 MED ORDER — HYDROXYZINE HCL 25 MG PO TABS
25.0000 mg | ORAL_TABLET | Freq: Three times a day (TID) | ORAL | Status: DC | PRN
  Administered 2022-01-20 – 2022-01-21 (×2): 25 mg via ORAL
  Filled 2022-01-18 (×2): qty 1

## 2022-01-18 MED ORDER — DIPHENHYDRAMINE HCL 25 MG PO CAPS
50.0000 mg | ORAL_CAPSULE | Freq: Once | ORAL | Status: AC
  Administered 2022-01-18: 50 mg via ORAL
  Filled 2022-01-18: qty 2

## 2022-01-18 MED ORDER — MIRTAZAPINE 15 MG PO TABS
15.0000 mg | ORAL_TABLET | Freq: Every day | ORAL | Status: DC
  Administered 2022-01-18 – 2022-01-20 (×3): 15 mg via ORAL
  Filled 2022-01-18 (×3): qty 1

## 2022-01-18 MED ORDER — DIPHENHYDRAMINE HCL 25 MG PO CAPS
25.0000 mg | ORAL_CAPSULE | Freq: Once | ORAL | Status: AC
Start: 2022-01-18 — End: 2022-01-18
  Administered 2022-01-18: 25 mg via ORAL
  Filled 2022-01-18: qty 1

## 2022-01-18 MED ORDER — QUETIAPINE FUMARATE 50 MG PO TABS
150.0000 mg | ORAL_TABLET | Freq: Every day | ORAL | Status: DC
  Administered 2022-01-18 – 2022-01-20 (×3): 150 mg via ORAL
  Filled 2022-01-18 (×3): qty 3

## 2022-01-18 NOTE — ED Notes (Signed)
Assumed pt care at this time

## 2022-01-18 NOTE — Progress Notes (Signed)
Presenting with SI.  ?Medically cleared. ?COVID 19 testing positive-now asymptomatic-did have symptoms 6 days ago.  Awaiting placement.  ?

## 2022-01-18 NOTE — ED Notes (Signed)
Per [facility], pt is recommended for inpatient treatment. MD notified.  ?

## 2022-01-18 NOTE — Consult Note (Signed)
Telepsych Consultation   Reason for Consult:  SI Referring Physician:  Theodis Blaze PA-C Location of Patient: [id number] Location of Provider: [facility]  Patient Identification: Carnisha Feltz MRN:  [id number] Principal Diagnosis: Bipolar II disorder, most recent episode major depressive ([place]) Diagnosis:  Principal Problem:   Bipolar II disorder, most recent episode major depressive ([place]) Active Problems:   Cocaine abuse ([place])   Cocaine-induced mood disorder ([place])   Total Time spent with patient: 20 minutes  Subjective:   Mitchelle Sultan is a 56 y.o. female patient with past psychiatric history of bipolar II, polysubstance abuse, alcohol abuse, cocaine abuse, substance induced mood disorder admitted with suicidal ideations.  On assessment patient presents sitting on edge of the bed; withdrawn, flat, restricted affect. She endorses 20+  year history of depression and chronic suicidal ideations. Husband was veteran. Poor eye contact. Endorses poor sleep, "okay" appetite Says son [employer] her a few dollars in which she "planned on using to buy pills and end it all". States she currently lives with daughter in [place]; says current living situation is not "doing too good" as she has been living with daughter for past 8 years since husband passed away. Says she's been staying "wherever I could" including outside since COVID diagnosis due to daughter not allowing her back in. Attributes her daughter as primary source of her emotional stress. No current outpatient providers, states she hasn't seen anyone since last November.   Continues to endorse active suicidal thoughts with plan via overdose. She denies any homicidal ideations, auditory or visual hallucinations at this time. She presents flat; minimal eye contact. Does not appear to be actively psychotic at this time or responding to any external/internal stimuli.   HPI:  Chirstine Defrain is a 56 year old  female patient who presented to [facility] voluntarily for assessment of suicidal ideations; patient reported having ideations x3 days with plan to OD on pills. Per chart review patient presented to ED 01/14/22 with similar presentation where she was continuously observed and discharged with plan to follow up with outpatient provider per her request. UDS+cocaine, THC; BAL<10. PDMP reviewed, no active prescriptions noted.   Past Psychiatric History: bipolar II, polysubstance abuse, alcohol abuse, cocaine abuse, substance induced mood disorder  Risk to Self:   Risk to Others:   Prior Inpatient Therapy:   Prior Outpatient Therapy:    Past Medical History:  Past Medical History:  Diagnosis Date   Allergy    seasonal   Anxiety    Arthritis    "left knee" (07/05/2016)   Asthma    Bipolar 1 disorder ([place])    Depression    Diabetes mellitus without complication (HCC)    pre-   GERD (gastroesophageal reflux disease)    Hypertension    MI (mitral incompetence)    Post traumatic stress disorder    Schizophrenia ([place])    Stomach ulcer    Substance abuse ([place])    cocaine quit july 2020   Suicide attempt [facility])     Past Surgical History:  Procedure Laterality Date   CARDIAC CATHETERIZATION  07/05/2016   CARDIAC CATHETERIZATION N/A 07/05/2016   Procedure: Left Heart Cath and Coronary Angiography;  Surgeon: Adrian Prows, MD;  Location: [facility];  Service: Cardiovascular;  Laterality: N/A;   CYST EXCISION Right    "wrist"   DILATION AND CURETTAGE OF UTERUS     FOOT SURGERY Bilateral    "corns removed"   LEFT HEART CATH AND  CORONARY ANGIOGRAPHY N/A 12/24/2017   Procedure: LEFT HEART CATH AND CORONARY ANGIOGRAPHY;  Surgeon: Nigel Mormon, MD;  Location: [facility];  Service: Cardiovascular;  Laterality: N/A;   TUBAL LIGATION     Family History:  Family History  Problem Relation Age of Onset   Breast cancer Other    Lung cancer Other    Congestive Heart Failure Other     Diabetes Other    Hypertension Other    Colon cancer Neg Hx    Colon polyps Neg Hx    Esophageal cancer Neg Hx    Stomach cancer Neg Hx    Rectal cancer Neg Hx    Family Psychiatric  History: not noted Social History:  Social History   Substance and Sexual Activity  Alcohol Use Yes   Alcohol/week: 7.0 standard drinks   Types: 7 Cans of beer per week     Social History   Substance and Sexual Activity  Drug Use Yes   Types: Marijuana    Social History   Socioeconomic History   Marital status: Widowed    Spouse name: Not on file   Number of children: 2   Years of education: Not on file   Highest education level: Not on file  Occupational History   Occupation: disabled   Tobacco Use   Smoking status: Some Days    Packs/day: 0.25    Years: 35.00    Pack years: 8.75    Types: Cigarettes    Last attempt to quit: 05/30/2019    Years since quitting: 2.6   Smokeless tobacco: Never  Vaping Use   Vaping Use: Never used  Substance and Sexual Activity   Alcohol use: Yes    Alcohol/week: 7.0 standard drinks    Types: 7 Cans of beer per week   Drug use: Yes    Types: Marijuana   Sexual activity: Yes    Birth control/protection: Surgical  Other Topics Concern   Not on file  Social History Narrative   Not on file   Social Determinants of Health   Financial Resource Strain: Not on file  Food Insecurity: Not on file  Transportation Needs: Not on file  Physical Activity: Not on file  Stress: Not on file  Social Connections: Not on file   Additional Social History:    Allergies:   Allergies  Allergen Reactions   Aspirin Hives   Food Hives    "Regular butter"   Morphine And Related Hives    Labs:  Results for orders placed or performed during the hospital encounter of 01/17/22 (from the past 48 hour(s))  Resp Panel by RT-PCR (Flu A&B, Covid) Nasopharyngeal Swab     Status: Abnormal   Collection Time: 01/17/22  5:14 PM   Specimen: Nasopharyngeal Swab;  Nasopharyngeal(NP) swabs in vial transport medium  Result Value Ref Range   SARS Coronavirus 2 by RT PCR POSITIVE (A) NEGATIVE    Comment: (NOTE) SARS-CoV-2 target nucleic acids are DETECTED.  The SARS-CoV-2 RNA is generally detectable in upper respiratory specimens during the acute phase of infection. Positive results are indicative of the presence of the identified virus, but do not rule out bacterial infection or co-infection with other pathogens not detected by the test. Clinical correlation with patient history and other diagnostic information is necessary to determine patient infection status. The expected result is Negative.  Fact Sheet for Patients: EntrepreneurPulse.com.au  Fact Sheet for Healthcare Providers: IncredibleEmployment.be  This test is not yet approved or cleared  by the [place] and  has been authorized for detection and/or diagnosis of SARS-CoV-2 by FDA under an Emergency Use Authorization (EUA).  This EUA will remain in effect (meaning this test can be used) for the duration of  the COVID-19 declaration under Section 564(b)(1) of the A ct, 21 U.S.C. section 360bbb-3(b)(1), unless the authorization is terminated or revoked sooner.     Influenza A by PCR NEGATIVE NEGATIVE   Influenza B by PCR NEGATIVE NEGATIVE    Comment: (NOTE) The Xpert Xpress SARS-CoV-2/FLU/RSV plus assay is intended as an aid in the diagnosis of influenza from Nasopharyngeal swab specimens and should not be used as a sole basis for treatment. Nasal washings and aspirates are unacceptable for Xpert Xpress SARS-CoV-2/FLU/RSV testing.  Fact Sheet for Patients: EntrepreneurPulse.com.au  Fact Sheet for Healthcare Providers: IncredibleEmployment.be  This test is not yet approved or cleared by the [place] FDA and has been authorized for detection and/or diagnosis of SARS-CoV-2 by FDA under an Emergency  Use Authorization (EUA). This EUA will remain in effect (meaning this test can be used) for the duration of the COVID-19 declaration under Section 564(b)(1) of the Act, 21 U.S.C. section 360bbb-3(b)(1), unless the authorization is terminated or revoked.  Performed at [facility], [place] [address] [place], [place] [address]   Rapid urine drug screen (hospital performed)     Status: Abnormal   Collection Time: 01/17/22  6:18 PM  Result Value Ref Range   Opiates NONE DETECTED NONE DETECTED   Cocaine POSITIVE (A) NONE DETECTED   Benzodiazepines NONE DETECTED NONE DETECTED   Amphetamines NONE DETECTED NONE DETECTED   Tetrahydrocannabinol POSITIVE (A) NONE DETECTED   Barbiturates NONE DETECTED NONE DETECTED    Comment: (NOTE) DRUG SCREEN FOR MEDICAL PURPOSES ONLY.  IF CONFIRMATION IS NEEDED FOR ANY PURPOSE, NOTIFY LAB WITHIN 5 DAYS.  LOWEST DETECTABLE LIMITS FOR URINE DRUG SCREEN Drug Class                     Cutoff (ng/mL) Amphetamine and metabolites    1000 Barbiturate and metabolites    200 Benzodiazepine                 194 Tricyclics and metabolites     300 Opiates and metabolites        300 Cocaine and metabolites        300 THC                            50 Performed at [facility], [place] [address], [place],  [address]   Comprehensive metabolic panel     Status: Abnormal   Collection Time: 01/17/22  6:24 PM  Result Value Ref Range   Sodium 142 135 - 145 mmol/L   Potassium 3.7 3.5 - 5.1 mmol/L   Chloride 107 98 - 111 mmol/L   CO2 25 22 - 32 mmol/L   Glucose, Bld 82 70 - 99 mg/dL    Comment: Glucose reference range applies only to samples taken after fasting for at least 8 hours.   BUN 9 6 - 20 mg/dL   Creatinine, Ser 1.33 (H) 0.44 - 1.00 mg/dL   Calcium 9.1 8.9 - 10.3 mg/dL   Total Protein 7.1 6.5 - 8.1 g/dL   Albumin 3.6 3.5 - 5.0 g/dL   AST 22 15 - 41 U/L   ALT 22 0 - 44 U/L   Alkaline  Phosphatase 65 38 - 126 U/L   Total Bilirubin 0.3 0.3 -  1.2 mg/dL   GFR, Estimated 47 (L) >60 mL/min    Comment: (NOTE) Calculated using the CKD-EPI Creatinine Equation (2021)    Anion gap 10 5 - 15    Comment: Performed at [facility] [address], [place], [place] [address]  Ethanol     Status: None   Collection Time: 01/17/22  6:24 PM  Result Value Ref Range   Alcohol, Ethyl (B) <10 <10 mg/dL    Comment: (NOTE) Lowest detectable limit for serum alcohol is 10 mg/dL.  For medical purposes only. Performed at [facility], [place] [address], [place], [place] [address]   Salicylate level     Status: Abnormal   Collection Time: 01/17/22  6:24 PM  Result Value Ref Range   Salicylate Lvl <4.9 (L) 7.0 - 30.0 mg/dL    Comment: Performed at [facility] [address] [place], [place] [address]  Acetaminophen level     Status: Abnormal   Collection Time: 01/17/22  6:24 PM  Result Value Ref Range   Acetaminophen (Tylenol), Serum <10 (L) 10 - 30 ug/mL    Comment: (NOTE) Therapeutic concentrations vary significantly. A range of 10-30 ug/mL  may be an effective concentration for many patients. However, some  are best treated at concentrations outside of this range. Acetaminophen concentrations >150 ug/mL at 4 hours after ingestion  and >50 ug/mL at 12 hours after ingestion are often associated with  toxic reactions.  Performed at [facility], [place] [address], [place], [place] [address]   cbc     Status: Abnormal   Collection Time: 01/17/22  6:24 PM  Result Value Ref Range   WBC 10.0 4.0 - 10.5 K/uL   RBC 5.02 3.87 - 5.11 MIL/uL   Hemoglobin 13.0 12.0 - 15.0 g/dL   HCT 42.3 36.0 - 46.0 %   MCV 84.3 80.0 - 100.0 fL   MCH 25.9 (L) 26.0 - 34.0 pg   MCHC 30.7 30.0 - 36.0 g/dL   RDW 14.7 11.5 - 15.5 %   Platelets 343 150 - 400 K/uL   nRBC 0.0 0.0 - 0.2 %    Comment: Performed at [facility], [place] [address], [place], [place] [address]  I-Stat beta hCG blood, ED     Status: None   Collection Time:  01/17/22  6:33 PM  Result Value Ref Range   I-stat hCG, quantitative <5.0 <5 mIU/mL   Comment 3            Comment:   GEST. AGE      CONC.  (mIU/mL)   <=1 WEEK        5 - 50     2 WEEKS       50 - 500     3 WEEKS       100 - 10,000     4 WEEKS     1,000 - 30,000        FEMALE AND NON-PREGNANT FEMALE:     LESS THAN 5 mIU/mL     Medications:  Current Facility-Administered Medications  Medication Dose Route Frequency Provider Last Rate Last Admin   amLODipine (NORVASC) tablet 5 mg  5 mg Oral q morning Lajean Saver, MD       hydrOXYzine (ATARAX) tablet 25 mg  25 mg Oral TID PRN Lajean Saver, MD       mirtazapine (  REMERON) tablet 15 mg  15 mg Oral QHS Lajean Saver, MD       QUEtiapine (SEROQUEL) tablet 150 mg  150 mg Oral QHS Lajean Saver, MD       Current Outpatient Medications  Medication Sig Dispense Refill   albuterol (VENTOLIN HFA) 108 (90 Base) MCG/ACT inhaler Inhale 1-2 puffs into the lungs every 6 (six) hours as needed for wheezing or shortness of breath. (Patient not taking: Reported on 01/18/2022) 1 each 0   amLODipine (NORVASC) 10 MG tablet Take 10 mg by mouth every morning. (Patient not taking: Reported on 01/18/2022)     bisacodyl (DULCOLAX) 5 MG EC tablet Take 2 tablets (10 mg total) by mouth daily as needed for moderate constipation. (Patient not taking: Reported on 01/14/2022) 30 tablet 0   fluticasone (FLOVENT HFA) 110 MCG/ACT inhaler Inhale 1 puff into the lungs 2 (two) times daily. (Patient not taking: Reported on 7/82/9562) 1 each 0   folic acid (FOLVITE) 1 MG tablet Take 1 tablet (1 mg total) by mouth daily. (Patient not taking: Reported on 01/14/2022) 30 tablet 0   gabapentin (NEURONTIN) 100 MG capsule Take 1 capsule (100 mg total) by mouth 3 (three) times daily. (Patient not taking: Reported on 01/18/2022) 90 capsule 0   guaiFENesin (ROBITUSSIN) 100 MG/5ML liquid Take 10 mLs by mouth every 4 (four) hours as needed for to loosen phlegm. (Patient not taking: Reported on  01/14/2022) 120 mL 0   hydrOXYzine (ATARAX/VISTARIL) 25 MG tablet Take 1 tablet (25 mg total) by mouth 3 (three) times daily as needed for anxiety. (Patient not taking: Reported on 01/14/2022) 30 tablet 0   lamoTRIgine (LAMICTAL) 25 MG tablet Take 1 tablet (25 mg total) by mouth daily. (Patient not taking: Reported on 01/14/2022) 30 tablet 0   loratadine (CLARITIN) 10 MG tablet Take 1 tablet (10 mg total) by mouth daily. (Patient not taking: Reported on 01/18/2022) 30 tablet 0   Melatonin 10 MG TABS Take 10 mg by mouth at bedtime. (Patient not taking: Reported on 01/18/2022) 30 tablet 0   mirtazapine (REMERON) 15 MG tablet Take 1 tablet (15 mg total) by mouth at bedtime. (Patient not taking: Reported on 01/14/2022) 30 tablet 0   Multiple Vitamin (MULTIVITAMIN WITH MINERALS) TABS tablet Take 1 tablet by mouth daily. (Patient not taking: Reported on 01/14/2022) 30 tablet    mupirocin ointment (BACTROBAN) 2 % Place into the nose 2 (two) times daily. Apply in Nose 2 times daily until 09/27/21. (Patient not taking: Reported on 01/14/2022) 22 g 0   nicotine polacrilex (NICORETTE) 2 MG gum Take 1 each (2 mg total) by mouth as needed for smoking cessation. (Patient not taking: Reported on 01/14/2022) 100 tablet 0   pantoprazole (PROTONIX) 40 MG tablet Take 1 tablet (40 mg total) by mouth every morning. (Patient not taking: Reported on 01/18/2022) 30 tablet 0   Pseudoephedrine-DM-GG (SUDAFED COUGH PO) Take 1 capsule by mouth daily as needed (Allergies). (Patient not taking: Reported on 01/18/2022)     QUEtiapine Fumarate 150 MG TABS Take 150 mg by mouth at bedtime. (Patient not taking: Reported on 01/14/2022) 30 tablet 0   simvastatin (ZOCOR) 40 MG tablet Take 1 tablet (40 mg total) by mouth daily with supper. (Patient not taking: Reported on 01/14/2022) 30 tablet 0   sodium chloride (OCEAN) 0.65 % SOLN nasal spray Place 1 spray into both nostrils as needed for congestion. (Patient not taking: Reported on 01/14/2022) 15 mL 0    thiamine 100 MG tablet Take  1 tablet (100 mg total) by mouth daily. (Patient not taking: Reported on 01/14/2022) 30 tablet 0   vitamin B-12 (CYANOCOBALAMIN) 1000 MCG tablet Take 1 tablet (1,000 mcg total) by mouth daily. (Patient not taking: Reported on 01/14/2022) 30 tablet 0   Vitamin D, Ergocalciferol, (DRISDOL) 1.25 MG (50000 UNIT) CAPS capsule Take 1 capsule (50,000 Units total) by mouth every 7 (seven) days. (Patient not taking: Reported on 01/14/2022) 5 capsule 0    Musculoskeletal: Strength & Muscle Tone: within normal limits Gait & Station: normal Patient leans: N/A  Psychiatric Specialty Exam:  Presentation  General Appearance: Casual  Eye Contact:Poor  Speech:Slow; Normal Rate  Speech Volume:Normal  Handedness:Right   Mood and Affect  Mood:Dysphoric; Depressed; Hopeless  Affect:Depressed; Restricted   Thought Process  Thought Processes:Coherent  Descriptions of Associations:Intact  Orientation:Full (Time, Place and Person)  Thought Content:Logical  History of Schizophrenia/Schizoaffective disorder:Yes  Duration of Psychotic Symptoms:No data recorded Hallucinations:Hallucinations: None  Ideas of Reference:None  Suicidal Thoughts:Suicidal Thoughts: Yes, Active SI Passive Intent and/or Plan: With Plan; With Intent; With Access to Means  Homicidal Thoughts:Homicidal Thoughts: No   Sensorium  Memory:Immediate Good; Recent Good; Remote Good  Judgment:Fair  Insight:Fair   Executive Functions  Concentration:Fair  Attention Span:Fair  [place]   Psychomotor Activity  Psychomotor Activity:Psychomotor Activity: Normal  Assets  Assets:Vocational/Educational; Resilience; Physical Health; Financial Resources/Insurance   Sleep  Sleep:Sleep: Fair   Physical Exam: Physical Exam Vitals and nursing note reviewed.  Constitutional:      Appearance: She is normal weight. She is not toxic-appearing or  diaphoretic.  HENT:     Head: Normocephalic.     Nose: Nose normal.     Mouth/Throat:     Mouth: Mucous membranes are moist.     Pharynx: Oropharynx is clear.  Eyes:     Pupils: Pupils are equal, round, and reactive to light.  Cardiovascular:     Rate and Rhythm: Normal rate.     Pulses: Normal pulses.  Pulmonary:     Effort: Pulmonary effort is normal.  Abdominal:     General: Abdomen is flat.  Musculoskeletal:        General: Normal range of motion.     Cervical back: Normal range of motion.  Skin:    General: Skin is dry.  Neurological:     Mental Status: She is alert. Mental status is at baseline.  Psychiatric:        Attention and Perception: Attention and perception normal. She does not perceive auditory or visual hallucinations.        Mood and Affect: Mood is depressed. Affect is flat.        Speech: Speech is delayed.        Behavior: Behavior is withdrawn. Behavior is cooperative.        Thought Content: Thought content is not paranoid or delusional. Thought content includes suicidal ideation. Thought content does not include homicidal ideation. Thought content includes suicidal plan. Thought content does not include homicidal plan.        Cognition and Memory: Cognition and memory normal.        Judgment: Judgment is inappropriate.   Review of Systems  Psychiatric/Behavioral:  Positive for depression, substance abuse and suicidal ideas.   All other systems reviewed and are negative. Blood pressure 130/86, pulse 81, temperature 97.6 F (36.4 C), temperature source Oral, resp. rate 18, height 5' 8"  (1.727 m), weight 88.5 kg, SpO2 99 %. Body mass index  is 29.67 kg/m.  Treatment Plan Summary: Daily contact with patient to assess and evaluate symptoms and progress in treatment, Medication management, and Plan continue to seek inpatient psychiatric hospitalization for further observation, stabilization, and treatment. Patient test positive for COVID 01/14/22. SW aware.    Disposition: Recommend psychiatric Inpatient admission when medically cleared. Supportive therapy provided about ongoing stressors. Discussed crisis plan, support from social network, calling 911, coming to the Emergency Department, and calling Suicide Hotline.  This service was provided via telemedicine using a 2-way, interactive audio and video technology.  Names of all persons participating in this telemedicine service and their role in this encounter. Name: Oneida Alar Role: PMHNP  Name: Hampton Abbot Role: Attending MD  Name: Dulcy Fanny Role: patient  Name:  Role:     Inda Merlin, NP 01/18/2022 10:30 AM

## 2022-01-18 NOTE — ED Notes (Signed)
Pt sleeping at this time. Will obtain vitals when she wakes up.  ?

## 2022-01-18 NOTE — ED Notes (Signed)
TTS machine in room ?
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
## 2022-01-18 NOTE — ED Provider Notes (Signed)
Emergency Medicine Observation Re-evaluation Note ? ?Denise Knight is a 56 y.o. female, seen on rounds today.  Pt initially presented to the ED for complaints of feeling depressed with suicidal thoughts. Also w non prod cough x 1 week, covid test four days ago was positive then. No sob. No cp.  ? ?Physical Exam  ?BP 130/86 (BP Location: Left Arm)   Pulse 81   Temp 97.6 ?F (36.4 ?C) (Oral)   Resp 18   SpO2 99%  ?Physical Exam ?General: alert, content, no distress.  ?Cardiac: regular rate. ?Lungs: breathing comfortably. ?Psych: alert, content. Depressed mood, flat affect. No current SI, but notes recent thoughts of same. Is not responding to internal stimuli.  ? ?ED Course / MDM  ? ? ?I have reviewed the labs performed to date as well as medications administered while in observation.  Recent changes in the last 24 hours include ED obs, med management, BH reassessment.  ? ?Plan  ? ? Denise Knight is not under involuntary commitment. ? ?[facility]la reassessment and disposition remains pending.  ? ?Pt currently not receiving any meds - pharmacy consult - med rec.  Will re-order home bh meds (pt has been non compliant w home meds). ? ?The patient has been placed in psychiatric observation due to the need to provide a safe environment for the patient while obtaining psychiatric consultation and evaluation, as well as ongoing medical and medication management to treat the patient's condition.  The patient has not been placed under full IVC at this time. ? ? ? ? ? ? ? ?  ?Lajean Saver, MD ?01/18/22 [phone] ? ?

## 2022-01-19 MED ORDER — BENZONATATE 100 MG PO CAPS
100.0000 mg | ORAL_CAPSULE | Freq: Three times a day (TID) | ORAL | Status: DC | PRN
  Administered 2022-01-19 – 2022-01-21 (×3): 100 mg via ORAL
  Filled 2022-01-19 (×4): qty 1

## 2022-01-19 NOTE — Progress Notes (Signed)
Per Suzzanne Cloud, patient meets criteria for inpatient treatment. There are no available or appropriate beds at [facility] today. CSW faxed referrals to the following facilities for review: ? ?[facility]  Pending - No Request Sent N/A [address], [place] [address] [phone] --  ?[facility]  Pending - No Request Sent N/A [address], [place] [address] [phone] --  ?[facility]  Pending - No Request [facility] [address] [place] [address] [phone] --  ?[facility]  Pending - No Request Sent N/A [address] [place] [address] [phone] --  ?[facility]  Pending - No Request Sent N/A [address], [place] [address] [phone] --  ?[facility]  Pending - No Request Sent N/A [address] [place], [place] [address] [phone] --  ?[facility]  Pending - No Request Sent N/A [address] [place] [address] [phone] --  ?[facility]  Pending - No Request Sent N/A [address] [place] [address] [phone] --  ?[facility]  Pending - No Request Sent N/A [place] [place] [address] [phone] --  ?[facility]  Pending - No Request Sent N/A [address] [place] [address] [phone] --  ?[facility]  Pending - No Request Sent N/A [address] [place] [address] [phone] --  ?[facility]  Pending - No Request Sent N/A [address] [place] [address] [phone] --  ?[place]  Pending - No Request Sent N/A [place] [place] [address] [phone] --  ?[facility]  Pending - No Request Sent N/A [address] [place] [address] [phone] --  ?[facility]  Pending - No Request Sent N/A [address] [place] [address] [phone] --  ?[facility]  Pending - No Request Sent N/A [address] [place] [address] [phone] --  ? ? ?TTS will continue to seek bed placement. ? ?Glennie Isle, MSW, LCSW-A, LCAS-A ?Phone: [phone] ?Disposition/TOC ? ?

## 2022-01-20 NOTE — Progress Notes (Signed)
Per Suzzanne Cloud, patient meets criteria for inpatient treatment. There are no available or appropriate beds at [facility] today. CSW re-faxed referrals to the following facilities for review: ? ?[facility]  Pending - No Request Sent N/A [address], [place] [address] [phone] --  ?[facility]  Pending - No Request Sent N/A [address], [place] [address] [phone] --  ?[facility]  Pending - No Request [facility] [address] [place] [address] [phone] --  ?[facility]  Pending - No Request Sent N/A [address] [place] [address] [phone] --  ?[facility]  Pending - No Request Sent N/A [address], [place] [address] [phone] --  ?[facility]  Pending - No Request Sent N/A [address] [place], [place] [address] [phone] --  ?[facility]  Pending - No Request Sent N/A [address] [place] [address] [phone] --  ?[facility]  Pending - No Request Sent N/A [address] [place] [address] [phone] --  ?[facility]  Pending - No Request Sent N/A [place] [place] [address] [phone] --  ?[facility]  Pending - No Request Sent N/A [address] [place] [address] [phone] --  ?[place]  Pending - No Request Sent N/A [address] [place] [address] [phone] --  ?[facility]  Pending - No Request Sent N/A [address] [place] [address] [phone] --  ?[place]  Pending - No Request Sent N/A [place] [place] [address] [phone] --   ?[facility]  Pending - No Request Sent N/A [address], [place] [address] [phone] --  ?[facility]  Pending - No Request Sent N/A [address] [place] [address] [phone] --  ?[facility]  Pending - No Request Sent N/A [address] [place] [address] [phone] --  ? ?TTS will continue to seek bed placement. ? ?Glennie Isle, MSW, LCSW-A, LCAS-A ?Phone: [phone] ?Disposition/TOC ? ?

## 2022-01-20 NOTE — ED Notes (Signed)
Meds given, pt requests prns, pt has no other requests, offered food and toileting.  ?

## 2022-01-20 NOTE — ED Notes (Addendum)
error 

## 2022-01-20 NOTE — Progress Notes (Addendum)
3/13: Per Malerie, [facility] admissions, pt has been accepted to [facility]. Accepting provider is Dr. Selinda Flavin. Patient can arrive 01/21/2022 after 8:00am. Number for report is [phone]. ? ?Glennie Isle, MSW, LCSW-A ?Phone: [phone] ?Disposition/TOC ? ? ?

## 2022-01-20 NOTE — ED Notes (Signed)
Called by intake from [facility] per MSW [place], spoke with [facility], pt has been accepted to [facility] under [place], pt is accepted on 3/10 at 8am number for report is [phone] ?

## 2022-01-20 NOTE — Progress Notes (Signed)
Malerie with [facility] contacted CSW in reference to attempting to review this patient for possible placement. CSW provided the patient's nurse number for further medical review. ? ?Glennie Isle, MSW, LCSW-A, LCAS-A ?Phone: [phone] ?Disposition/TOC ? ?

## 2022-01-20 NOTE — ED Provider Notes (Signed)
?  Physical Exam  ?BP 121/86 (BP Location: Left Arm)   Pulse 86   Temp 98 ?F (36.7 ?C) (Oral)   Resp 18   Ht 5' 8"  (1.727 m)   Wt 88.5 kg   SpO2 97%   BMI 29.67 kg/m?  ? ?Physical Exam ? ?Procedures  ?Procedures ? ?ED Course / MDM  ?  ?Medical Decision Making ?Amount and/or Complexity of Data Reviewed ?Labs: ordered. ? ?Risk ?Prescription drug management. ? ? ?Patient been accepted to [facility].  Dr. Selinda Flavin excepting.  Can be transferred tomorrow, 13th at 8 AM. ? ? ? ? ?  ?Davonna Belling, MD ?01/20/22 1100 ? ?

## 2022-01-21 LAB — CBG MONITORING, ED: Glucose-Capillary: 104 mg/dL — ABNORMAL HIGH (ref 70–99)

## 2022-01-21 NOTE — ED Notes (Signed)
Breakfast order placed ?

## 2022-01-21 NOTE — ED Notes (Signed)
All belongings given to pt: 4 bags including pink purse and red phone.  ?

## 2022-01-21 NOTE — ED Notes (Addendum)
[facility]  [phone] called by NS x5, messages left. No Response. Will notify AC.  ?

## 2022-01-21 NOTE — ED Provider Notes (Signed)
?  Physical Exam  ?BP 129/79   Pulse 86   Temp 98.6 ?F (37 ?C) (Oral)   Resp 16   Ht 5' 8"  (1.727 m)   Wt 88.5 kg   SpO2 99%   BMI 29.67 kg/m?  ? ? ? ?Procedures  ?Procedures ? ?ED Course / MDM  ?  ?Medical Decision Making ?Amount and/or Complexity of Data Reviewed ?Labs: ordered. ? ?Risk ?Prescription drug management. ? ? ?Patient been accepted to [facility].  Dr. Selinda Flavin excepting.  EMTALA completed. ? ? ? ? ?  ? ? ?  ?Regan Lemming, MD ?01/21/22 [phone] ? ?

## 2022-04-25 ENCOUNTER — Emergency Department (HOSPITAL_COMMUNITY)

## 2022-04-25 ENCOUNTER — Emergency Department (HOSPITAL_COMMUNITY)
Admission: EM | Admit: 2022-04-25 | Discharge: 2022-04-26 | Disposition: A | Discharge status: Home / Self Care | Attending: Emergency Medicine | Admitting: Emergency Medicine

## 2022-04-25 ENCOUNTER — Other Ambulatory Visit: Payer: Self-pay

## 2022-04-25 ENCOUNTER — Encounter (HOSPITAL_COMMUNITY): Payer: Self-pay

## 2022-04-25 DIAGNOSIS — R079 Chest pain, unspecified: Secondary | ICD-10-CM | POA: Insufficient documentation

## 2022-04-25 DIAGNOSIS — R0781 Pleurodynia: Secondary | ICD-10-CM | POA: Diagnosis not present

## 2022-04-25 DIAGNOSIS — J45909 Unspecified asthma, uncomplicated: Secondary | ICD-10-CM | POA: Insufficient documentation

## 2022-04-25 DIAGNOSIS — R112 Nausea with vomiting, unspecified: Secondary | ICD-10-CM | POA: Diagnosis not present

## 2022-04-25 DIAGNOSIS — Z7951 Long term (current) use of inhaled steroids: Secondary | ICD-10-CM | POA: Insufficient documentation

## 2022-04-25 DIAGNOSIS — R0602 Shortness of breath: Secondary | ICD-10-CM | POA: Diagnosis not present

## 2022-04-25 DIAGNOSIS — E119 Type 2 diabetes mellitus without complications: Secondary | ICD-10-CM | POA: Insufficient documentation

## 2022-04-25 LAB — CBC WITH DIFFERENTIAL/PLATELET
Abs Immature Granulocytes: 0.02 10*3/uL (ref 0.00–0.07)
Basophils Absolute: 0.1 10*3/uL (ref 0.0–0.1)
Basophils Relative: 1 %
Eosinophils Absolute: 0.4 10*3/uL (ref 0.0–0.5)
Eosinophils Relative: 4 %
HCT: 43.7 % (ref 36.0–46.0)
Hemoglobin: 13.7 g/dL (ref 12.0–15.0)
Immature Granulocytes: 0 %
Lymphocytes Relative: 31 %
Lymphs Abs: 3.4 10*3/uL (ref 0.7–4.0)
MCH: 26.3 pg (ref 26.0–34.0)
MCHC: 31.4 g/dL (ref 30.0–36.0)
MCV: 84 fL (ref 80.0–100.0)
Monocytes Absolute: 0.5 10*3/uL (ref 0.1–1.0)
Monocytes Relative: 4 %
Neutro Abs: 6.7 10*3/uL (ref 1.7–7.7)
Neutrophils Relative %: 60 %
Platelets: 351 10*3/uL (ref 150–400)
RBC: 5.2 MIL/uL — ABNORMAL HIGH (ref 3.87–5.11)
RDW: 17.2 % — ABNORMAL HIGH (ref 11.5–15.5)
WBC: 11 10*3/uL — ABNORMAL HIGH (ref 4.0–10.5)
nRBC: 0 % (ref 0.0–0.2)

## 2022-04-25 LAB — COMPREHENSIVE METABOLIC PANEL
ALT: 18 U/L (ref 0–44)
AST: 22 U/L (ref 15–41)
Albumin: 3.7 g/dL (ref 3.5–5.0)
Alkaline Phosphatase: 59 U/L (ref 38–126)
Anion gap: 13 (ref 5–15)
BUN: 16 mg/dL (ref 6–20)
CO2: 20 mmol/L — ABNORMAL LOW (ref 22–32)
Calcium: 9.3 mg/dL (ref 8.9–10.3)
Chloride: 108 mmol/L (ref 98–111)
Creatinine, Ser: 1.59 mg/dL — ABNORMAL HIGH (ref 0.44–1.00)
GFR, Estimated: 38 mL/min — ABNORMAL LOW (ref >60)
Glucose, Bld: 82 mg/dL (ref 70–99)
Potassium: 4.8 mmol/L (ref 3.5–5.1)
Sodium: 141 mmol/L (ref 135–145)
Total Bilirubin: 1.2 mg/dL (ref 0.3–1.2)
Total Protein: 7.1 g/dL (ref 6.5–8.1)

## 2022-04-25 LAB — TROPONIN I (HIGH SENSITIVITY)
Troponin I (High Sensitivity): 5 ng/L (ref <18)
Troponin I (High Sensitivity): 5 ng/L (ref <18)

## 2022-04-25 LAB — LIPASE, BLOOD: Lipase: 28 U/L (ref 11–51)

## 2022-04-25 IMAGING — US US ABDOMEN LIMITED
1 series · 14 of 25 positions shown · non-contrast
Comparison: None Available.

CLINICAL DATA: Pain

EXAM:
ULTRASOUND ABDOMEN LIMITED RIGHT UPPER QUADRANT

[Series 1: us abdomen limited ruq (liver/gb) · 14 of 64 slices shown]
[im 1/64]
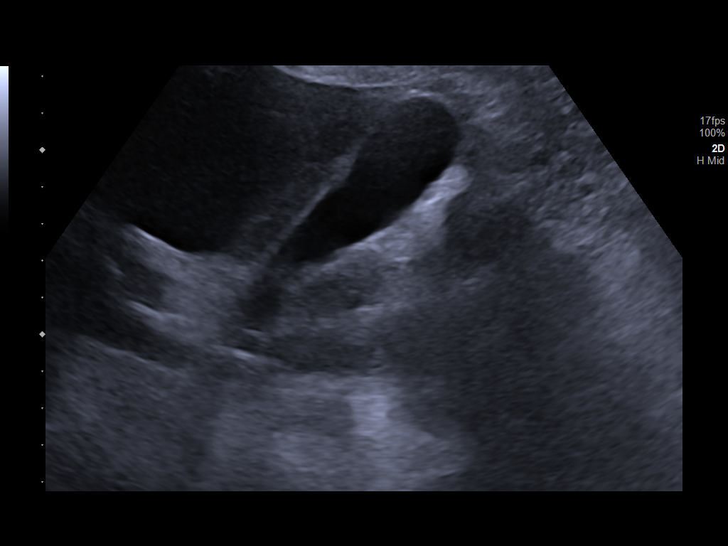
[im 6/64]
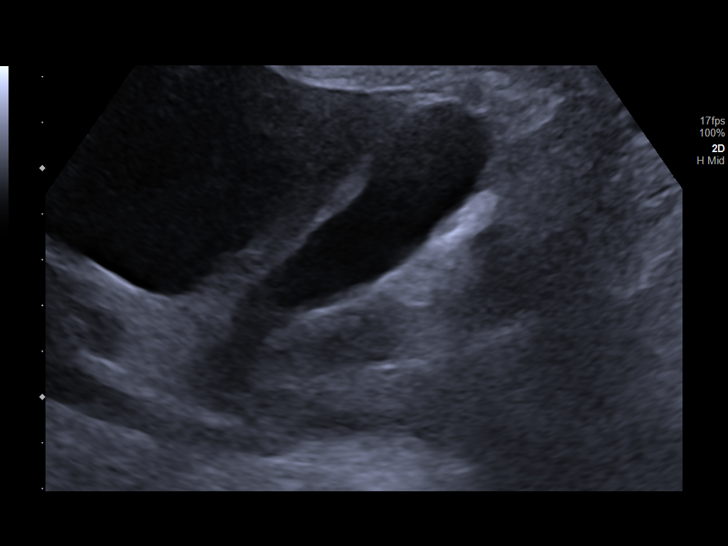
[im 11/64]
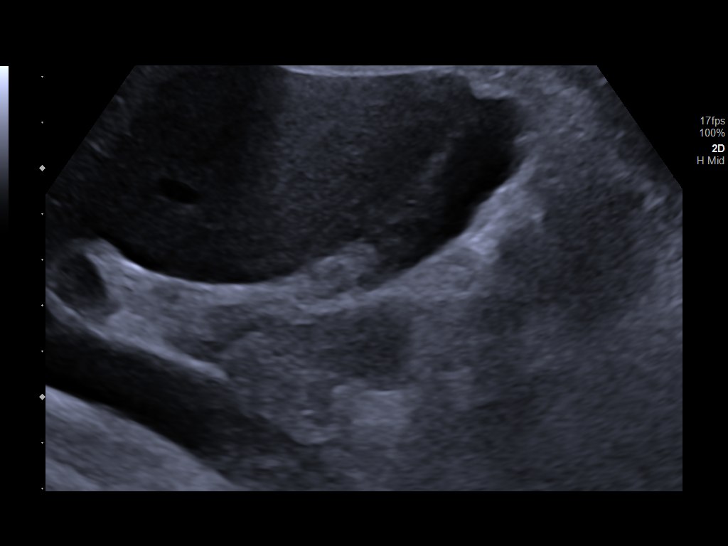
[im 16/64]
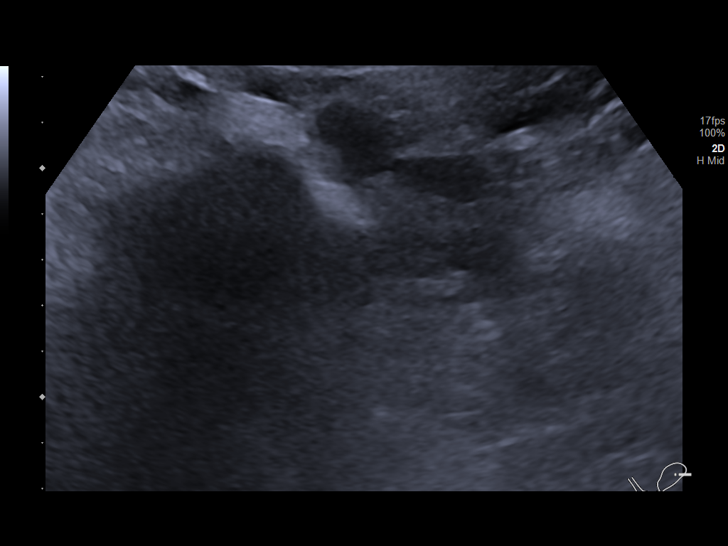
[im 22/64]
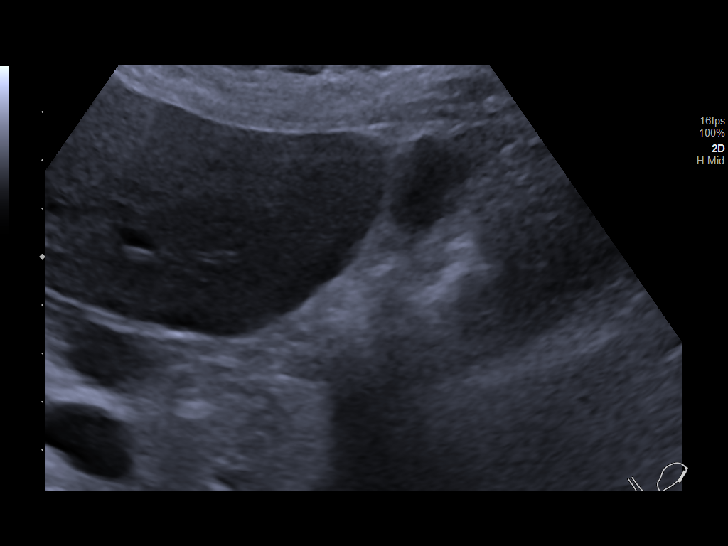
[im 24/64]
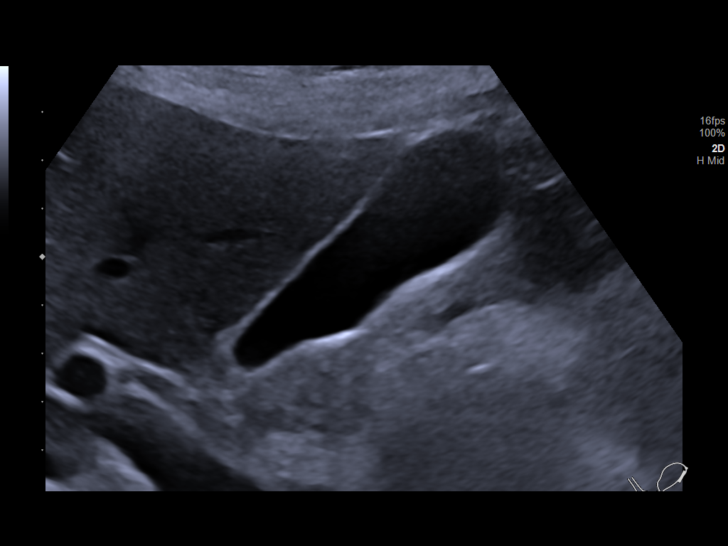
[im 29/64]
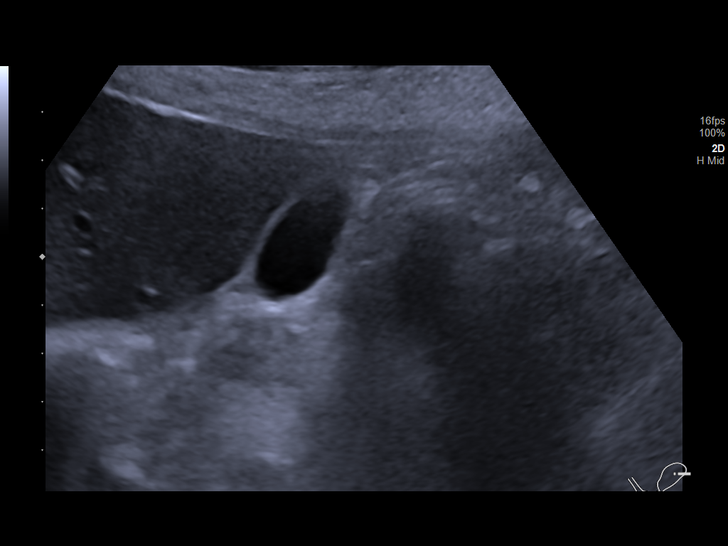
[im 35/64]
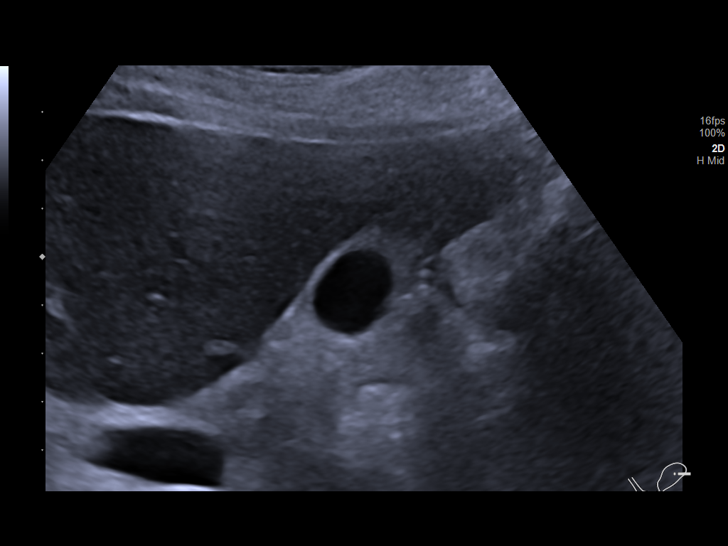
[im 40/64]
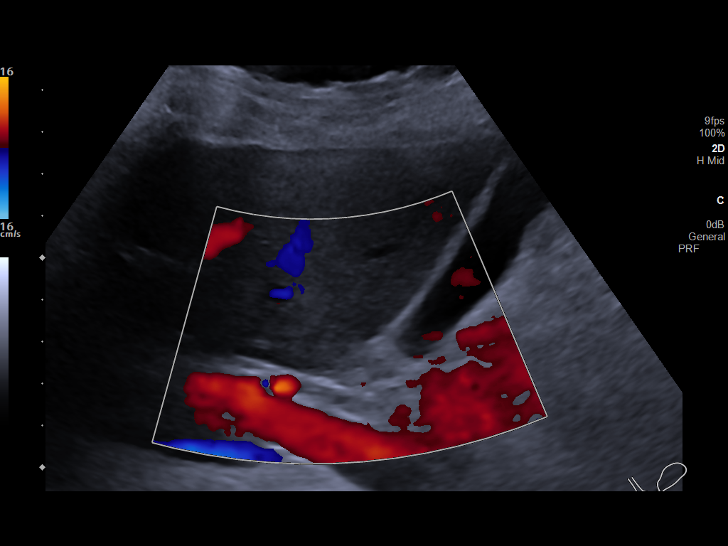
[im 43/64]
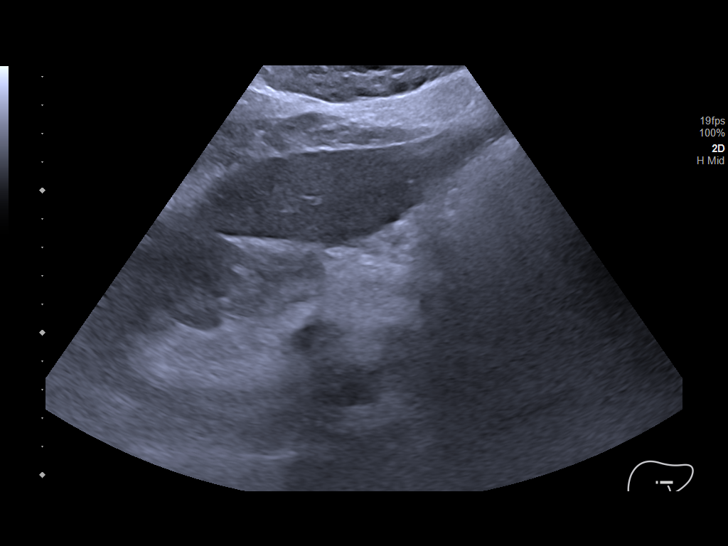
[im 48/64]
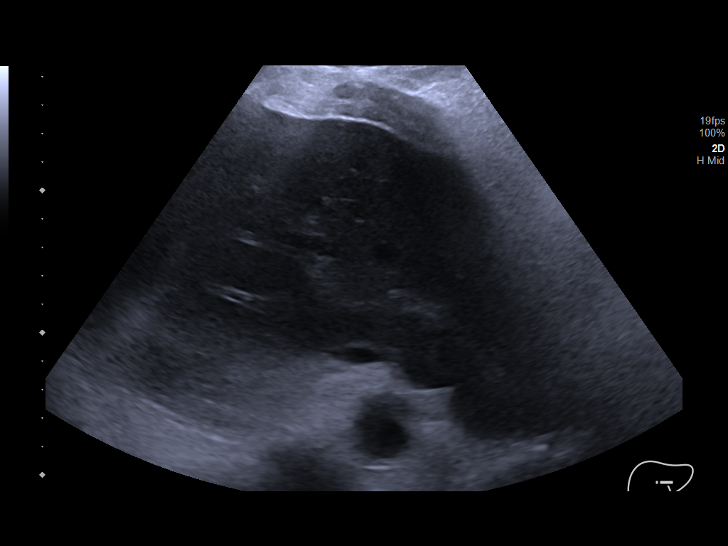
[im 53/64]
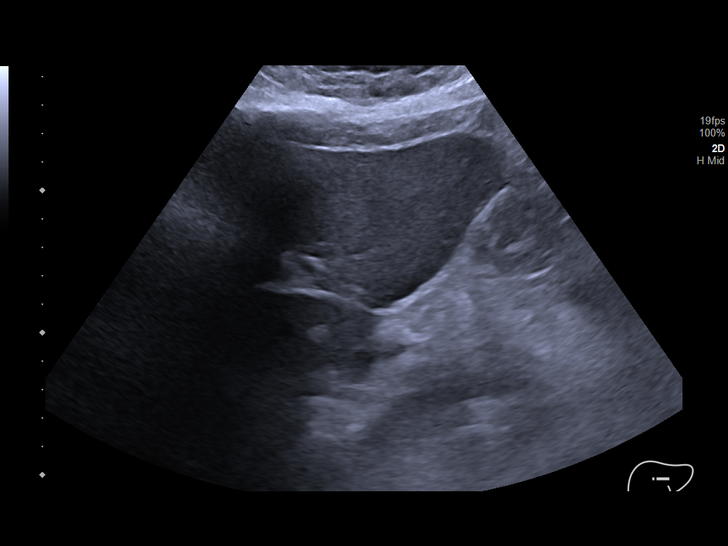
[im 58/64]
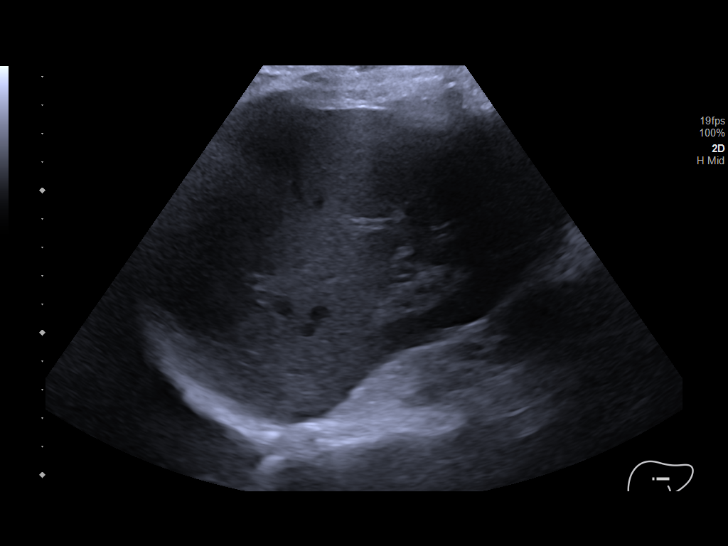
[im 64/64]
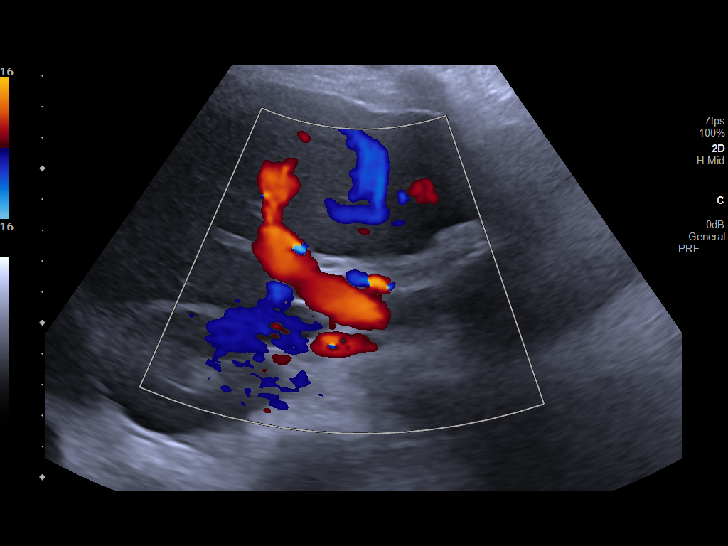

[14 of 25 positions shown; findings below may reference images not displayed]

FINDINGS: Gallbladder:

No gallstones or wall thickening visualized. Positive sonographic
Murphy sign noted by sonographer. No pericholecystic fluid.

Common bile duct:

Diameter: 3 mm

Liver:

No focal lesion identified. Within normal limits in parenchymal
echogenicity. Portal vein is patent on color Doppler imaging with
normal direction of blood flow towards the liver.

Other: None.
IMPRESSION: No cholelithiasis, pericholecystic fluid, or gallbladder wall
thickening to suggest cholecystitis, with a positive Murphy's sign,
of unknown significance.

## 2022-04-25 IMAGING — DX DG CHEST 1V PORT
1 series · 1 of 1 positions shown · non-contrast
Comparison: Radiograph [DATE]

CLINICAL DATA: right sided JILIA

EXAM:
PORTABLE CHEST 1 VIEW

[chest]
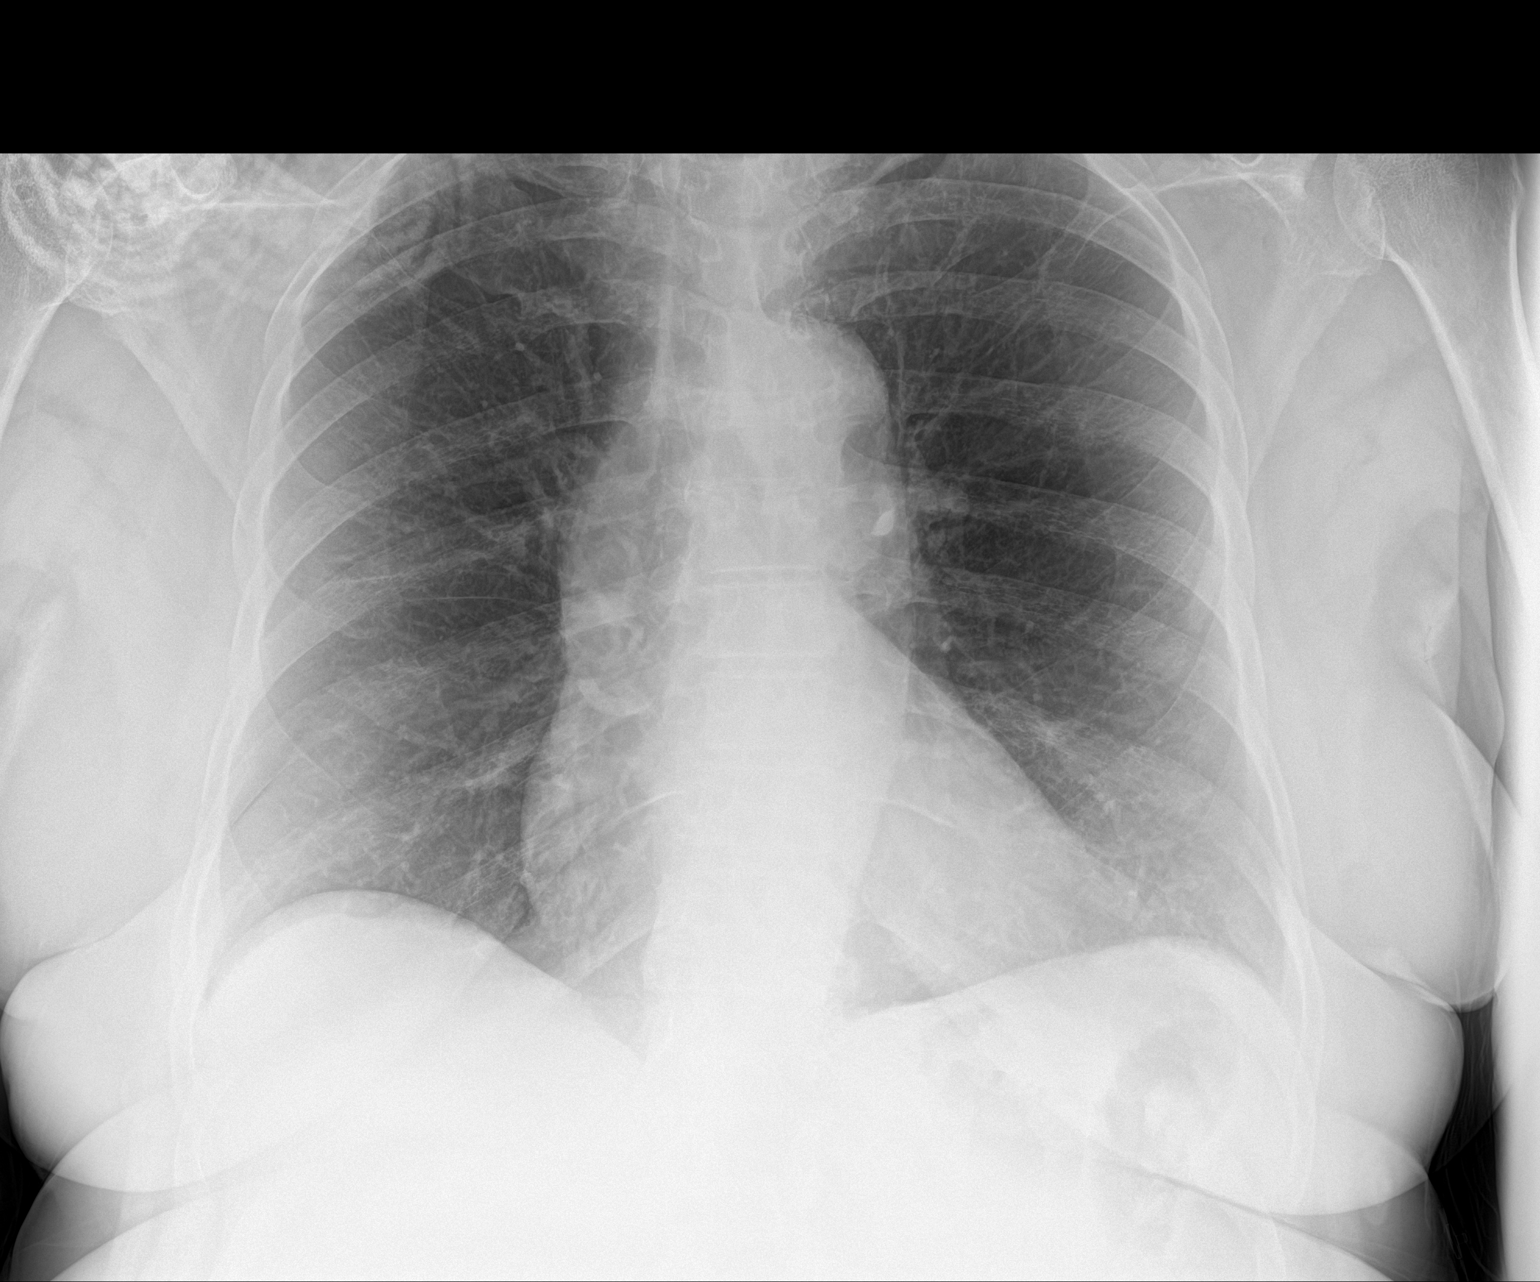

[1 of 1 positions shown; findings below may reference images not displayed]

FINDINGS: The cardiomediastinal silhouette is within normal limits. There is
no focal airspace disease. No pleural effusion. No visible
pneumothorax. Artifact due to the patient's hair overlies the
apices, right greater than left. No acute osseous abnormality.
IMPRESSION: No evidence of acute cardiopulmonary disease.

## 2022-04-25 MED ORDER — OXYCODONE-ACETAMINOPHEN 5-325 MG PO TABS
2.0000 | ORAL_TABLET | Freq: Once | ORAL | Status: AC
  Administered 2022-04-25: 2 via ORAL
  Filled 2022-04-25: qty 2

## 2022-04-25 MED ORDER — IPRATROPIUM-ALBUTEROL 0.5-2.5 (3) MG/3ML IN SOLN
3.0000 mL | Freq: Once | RESPIRATORY_TRACT | Status: AC
  Administered 2022-04-25: 3 mL via RESPIRATORY_TRACT
  Filled 2022-04-25: qty 3

## 2022-04-25 MED ORDER — ONDANSETRON 4 MG PO TBDP
ORAL_TABLET | ORAL | Status: AC
  Filled 2022-04-25: qty 1

## 2022-04-25 MED ORDER — ONDANSETRON 4 MG PO TBDP
4.0000 mg | ORAL_TABLET | Freq: Once | ORAL | Status: AC
  Administered 2022-04-25: 4 mg via ORAL

## 2022-04-25 MED ORDER — SODIUM CHLORIDE 0.9 % IV BOLUS
1000.0000 mL | Freq: Once | INTRAVENOUS | Status: AC
  Administered 2022-04-25: 1000 mL via INTRAVENOUS

## 2022-04-25 NOTE — ED Provider Notes (Signed)
[facility] EMERGENCY DEPARTMENT Provider Note   CSN: [id number] Arrival date & time: 04/25/22  1411     History  Chief Complaint  Patient presents with   Chest Pain    Denise Knight is a 56 y.o. female Who presents with concern for chest pain that started 45 minutes prior to arrival with associated shortness of breath.  She also endorses that she was feeling nauseous and vomited once during that episode.  Reportedly hyperventilating in triage.  She endorses that she has been belching and has discomfort to the right side of her ribs and in her epigastrium.  Denies any diarrhea melena, or hematochezia.  Reports urinating less frequently than normal.  She states the pain is sharp and sometimes burning in nature.  Denies any exertional component, palpitations, recent travel, prolonged mobilization, recent surgeries, history of malignancy or clot in the past.  She is also not on any hormone replacement is postmenopausal.  I personally reviewed this patient's medical records.  Has history of polysubstance abuse, GERD, asthma, and major depressive disorder.  Also has type 2 diabetes.  Patient states she is not on any medications every day but is supposed to be on pantoprazole.  States that she takes omeprazole over-the-counter when she needs it.  HPI     Home Medications Prior to Admission medications   Medication Sig Start Date End Date Taking? Authorizing Provider  albuterol (VENTOLIN HFA) 108 (90 Base) MCG/ACT inhaler Inhale 1-2 puffs into the lungs every 6 (six) hours as needed for wheezing or shortness of breath. Patient not taking: Reported on 01/18/2022 09/25/21   Armando Reichert, MD  amLODipine (NORVASC) 10 MG tablet Take 10 mg by mouth every morning. Patient not taking: Reported on 01/18/2022 05/03/21   [provider]  bisacodyl (DULCOLAX) 5 MG EC tablet Take 2 tablets (10 mg total) by mouth daily as needed for moderate constipation. Patient not taking:  Reported on 01/14/2022 09/25/21   Armando Reichert, MD  fluticasone (FLOVENT HFA) 110 MCG/ACT inhaler Inhale 1 puff into the lungs 2 (two) times daily. Patient not taking: Reported on 01/18/2022 09/25/21   Armando Reichert, MD  folic acid (FOLVITE) 1 MG tablet Take 1 tablet (1 mg total) by mouth daily. Patient not taking: Reported on 01/14/2022 09/25/21   Armando Reichert, MD  gabapentin (NEURONTIN) 100 MG capsule Take 1 capsule (100 mg total) by mouth 3 (three) times daily. Patient not taking: Reported on 01/18/2022 09/25/21   Armando Reichert, MD  guaiFENesin (ROBITUSSIN) 100 MG/5ML liquid Take 10 mLs by mouth every 4 (four) hours as needed for to loosen phlegm. Patient not taking: Reported on 01/14/2022 09/25/21   Armando Reichert, MD  hydrOXYzine (ATARAX/VISTARIL) 25 MG tablet Take 1 tablet (25 mg total) by mouth 3 (three) times daily as needed for anxiety. Patient not taking: Reported on 01/14/2022 09/25/21   Armando Reichert, MD  lamoTRIgine (LAMICTAL) 25 MG tablet Take 1 tablet (25 mg total) by mouth daily. Patient not taking: Reported on 01/14/2022 09/25/21 10/25/21  Armando Reichert, MD  loratadine (CLARITIN) 10 MG tablet Take 1 tablet (10 mg total) by mouth daily. Patient not taking: Reported on 01/18/2022 09/25/21   Armando Reichert, MD  Melatonin 10 MG TABS Take 10 mg by mouth at bedtime. Patient not taking: Reported on 01/18/2022 09/25/21   Armando Reichert, MD  mirtazapine (REMERON) 15 MG tablet Take 1 tablet (15 mg total) by mouth at bedtime. Patient not taking: Reported on 01/14/2022 09/25/21 10/25/21  Armando Reichert, MD  Multiple Vitamin (MULTIVITAMIN WITH MINERALS) TABS tablet Take 1 tablet by mouth daily. Patient not taking: Reported on 01/14/2022 09/25/21   Armando Reichert, MD  mupirocin ointment (BACTROBAN) 2 % Place into the nose 2 (two) times daily. Apply in Nose 2 times daily until 09/27/21. Patient not taking: Reported on 01/14/2022 09/27/21   Armando Reichert, MD  nicotine polacrilex (NICORETTE) 2 MG gum Take 1 each (2  mg total) by mouth as needed for smoking cessation. Patient not taking: Reported on 01/14/2022 09/25/21   Armando Reichert, MD  pantoprazole (PROTONIX) 40 MG tablet Take 1 tablet (40 mg total) by mouth every morning. Patient not taking: Reported on 01/18/2022 09/25/21   Armando Reichert, MD  Pseudoephedrine-DM-GG (SUDAFED COUGH PO) Take 1 capsule by mouth daily as needed (Allergies). Patient not taking: Reported on 01/18/2022    [provider]  QUEtiapine Fumarate 150 MG TABS Take 150 mg by mouth at bedtime. Patient not taking: Reported on 01/14/2022 09/25/21   Armando Reichert, MD  simvastatin (ZOCOR) 40 MG tablet Take 1 tablet (40 mg total) by mouth daily with supper. Patient not taking: Reported on 01/14/2022 09/25/21   Armando Reichert, MD  sodium chloride (OCEAN) 0.65 % SOLN nasal spray Place 1 spray into both nostrils as needed for congestion. Patient not taking: Reported on 01/14/2022 09/25/21   Armando Reichert, MD  thiamine 100 MG tablet Take 1 tablet (100 mg total) by mouth daily. Patient not taking: Reported on 01/14/2022 09/25/21   Armando Reichert, MD  vitamin B-12 (CYANOCOBALAMIN) 1000 MCG tablet Take 1 tablet (1,000 mcg total) by mouth daily. Patient not taking: Reported on 01/14/2022 09/25/21   Armando Reichert, MD  Vitamin D, Ergocalciferol, (DRISDOL) 1.25 MG (50000 UNIT) CAPS capsule Take 1 capsule (50,000 Units total) by mouth every 7 (seven) days. Patient not taking: Reported on 01/14/2022 09/26/21   Armando Reichert, MD  amantadine (SYMMETREL) 100 MG capsule Take 1 capsule (100 mg total) by mouth 2 (two) times daily. 01/17/20 10/13/20  Connye Burkitt, NP      Allergies    Morphine and related, Aspirin, Food, Lactose, Other, and Tilactase    Review of Systems   Review of Systems  Respiratory:  Positive for shortness of breath.   Cardiovascular:  Positive for chest pain.  Gastrointestinal:  Positive for abdominal pain and nausea.  Genitourinary:  Positive for decreased urine volume.   Psychiatric/Behavioral:  The patient is nervous/anxious.     Physical Exam Updated Vital Signs BP 103/75   Pulse 83   Temp 97.7 F (36.5 C) (Oral)   Resp 17   Ht 5' 8"  (1.727 m)   Wt 88.5 kg   SpO2 91%   BMI 29.65 kg/m  Physical Exam Vitals and nursing note reviewed.  Constitutional:      Appearance: She is obese. She is not ill-appearing or toxic-appearing.  HENT:     Head: Normocephalic and atraumatic.     Nose: Nose normal.     Mouth/Throat:     Mouth: Mucous membranes are moist.     Pharynx: Oropharynx is clear. Uvula midline. No oropharyngeal exudate or posterior oropharyngeal erythema.     Tonsils: No tonsillar exudate.  Eyes:     General: Lids are normal. Vision grossly intact.        Right eye: No discharge.        Left eye: No discharge.     Conjunctiva/sclera: Conjunctivae normal.     Pupils: Pupils are equal, round, and reactive to light.  Neck:     Trachea: Trachea and phonation normal.  Cardiovascular:     Rate and Rhythm: Normal rate and regular rhythm.     Pulses: Normal pulses.     Heart sounds: Normal heart sounds. No murmur heard. Pulmonary:     Effort: Pulmonary effort is normal. No tachypnea, bradypnea or respiratory distress.     Breath sounds: Normal breath sounds. No wheezing or rales.  Chest:     Chest wall: Tenderness present. No mass, lacerations, deformity, swelling, crepitus or edema.    Abdominal:     General: Bowel sounds are normal. There is no distension.     Palpations: Abdomen is soft.     Tenderness: There is no abdominal tenderness. There is no right CVA tenderness, left CVA tenderness or guarding.  Musculoskeletal:        General: No deformity.     Cervical back: Normal range of motion and neck supple.     Right lower leg: No edema.     Left lower leg: No edema.  Lymphadenopathy:     Cervical: No cervical adenopathy.  Skin:    General: Skin is warm and dry.     Capillary Refill: Capillary refill takes less than 2  seconds.  Neurological:     General: No focal deficit present.     Mental Status: She is alert. Mental status is at baseline.  Psychiatric:        Attention and Perception: Attention normal.        Mood and Affect: Mood normal. Affect is flat and tearful.        Speech: Speech normal.        Behavior: Behavior is withdrawn.        Thought Content: Thought content does not include suicidal ideation.     ED Results / Procedures / Treatments   Labs (all labs ordered are listed, but only abnormal results are displayed) Labs Reviewed  CBC WITH DIFFERENTIAL/PLATELET - Abnormal; Notable for the following components:      Result Value   WBC 11.0 (*)    RBC 5.20 (*)    RDW 17.2 (*)    All other components within normal limits  COMPREHENSIVE METABOLIC PANEL - Abnormal; Notable for the following components:   CO2 20 (*)    Creatinine, Ser 1.59 (*)    GFR, Estimated 38 (*)    All other components within normal limits  URINALYSIS, ROUTINE W REFLEX MICROSCOPIC - Abnormal; Notable for the following components:   APPearance CLOUDY (*)    Hgb urine dipstick MODERATE (*)    Ketones, ur 20 (*)    Protein, ur 30 (*)    Leukocytes,Ua LARGE (*)    Bacteria, UA RARE (*)    All other components within normal limits  LIPASE, BLOOD  TROPONIN I (HIGH SENSITIVITY)  TROPONIN I (HIGH SENSITIVITY)    EKG EKG Interpretation  Date/Time:  Thursday April 25 2022 14:11:39 EDT Ventricular Rate:  104 PR Interval:  154 QRS Duration: 60 QT Interval:  364 QTC Calculation: 478 R Axis:   52 Text Interpretation: Sinus tachycardia Nonspecific T wave abnormality Abnormal ECG Confirmed by Ripley Fraise [phone]) on 04/25/2022 11:09:02 PM  Radiology US Abdomen Limited RUQ (LIVER/GB)  Result Date: 04/25/2022 CLINICAL DATA:  Pain EXAM: ULTRASOUND ABDOMEN LIMITED RIGHT UPPER QUADRANT COMPARISON:  None Available. FINDINGS: Gallbladder: No gallstones or wall thickening visualized. Positive sonographic Murphy sign  noted by sonographer. No pericholecystic fluid. Common bile duct: Diameter: 3 mm Liver:  No focal lesion identified. Within normal limits in parenchymal echogenicity. Portal vein is patent on color Doppler imaging with normal direction of blood flow towards the liver. Other: None. IMPRESSION: No cholelithiasis, pericholecystic fluid, or gallbladder wall thickening to suggest cholecystitis, with a positive Murphy's sign, of unknown significance. Electronically Signed   By: Merilyn Baba M.D.   On: 04/25/2022 17:41   DG Chest Portable 1 View  Result Date: 04/25/2022 CLINICAL DATA:  right sided ches tpain EXAM: PORTABLE CHEST 1 VIEW COMPARISON:  Radiograph 01/14/2022 FINDINGS: The cardiomediastinal silhouette is within normal limits. There is no focal airspace disease. No pleural effusion. No visible pneumothorax. Artifact due to the patient's hair overlies the apices, right greater than left. No acute osseous abnormality. IMPRESSION: No evidence of acute cardiopulmonary disease. Electronically Signed   By: Maurine Simmering M.D.   On: 04/25/2022 15:18    Procedures Procedures    Medications Ordered in ED Medications  oxyCODONE-acetaminophen (PERCOCET/ROXICET) 5-325 MG per tablet 2 tablet (2 tablets Oral Given 04/25/22 1426)  ondansetron (ZOFRAN-ODT) disintegrating tablet 4 mg (4 mg Oral Given 04/25/22 1429)  sodium chloride 0.9 % bolus 1,000 mL (0 mLs Intravenous Stopped 04/26/22 0048)  ipratropium-albuterol (DUONEB) 0.5-2.5 (3) MG/3ML nebulizer solution 3 mL (3 mLs Nebulization Given 04/25/22 2346)  alum & mag hydroxide-simeth (MAALOX/MYLANTA) 200-200-20 MG/5ML suspension 30 mL (30 mLs Oral Given 04/26/22 0337)    And  lidocaine (XYLOCAINE) 2 % viscous mouth solution 15 mL (15 mLs Oral Given 04/26/22 5277)    ED Course/ Medical Decision Making/ A&P Clinical Course as of 04/26/22 0715  Thu Apr 25, 2022  2307 Creatinine(!): 1.59 Renal insufficiency [DW]  2308 WBC(!): 11.0 Leukocytosis [DW]    Clinical  Course User Index [DW] Ripley Fraise, MD                           Medical Decision Making 56 year-old female presents with concern for right lower rib pain and epigastric pain that started approximate 1 hour prior to arrival.  Blood pressures in the low 100s on intake so normal.  No tachycardia or tachypnea on intake.  Patient is tearful therefore causing intermittent episodes of tachypnea while crying.  Cardiopulmonary exam is remarkable only for decreased air movement in the lung bases.  Abdominal exam with exquisite tenderness palpation of the right anterior inferior ribs but otherwise unremarkable.  No evidence of trauma or skin changes in the area.  Neurovascular intact in all tremors without lower extremity edema.  Patient with flat affect but tearful intermittently, very anxious at times.  Differential diagnosis of epigastric pain includes but is not limited to: Functional or nonulcer dyspepsia (MCC), PUD, GERD, Gastritis, (NSAIDs, alcohol, stress, H. pylori, pernicious anemia), pancreatitis / pancreatic cancer, overeating indigestion (high-fat foods, coffee), drugs (aspirin, antibiotics (eg, macrolides, metronidazole), corticosteroids, digoxin, narcotics, theophylline), gastroparesis, gastric volvulus, gastric cancer, lactose intolerance, malabsorption, parasitic infection (Giardia, Strongyloides, Ascaris), abdominal hernia, intestinal ischemia, esophageal rupture,  cholelithiasis /choledocholithiasis / cholangitis, hepatitis, ACS, pericarditis, pneumonia, pregnancy.   Amount and/or Complexity of Data Reviewed Labs:  Decision-making details documented in ED Course.    Details: CBC with mild leukocytosis 11,000 but without anemia.  CMP with elevated creatinine to 1.5 increasing patient's baseline of 0.83 months ago.  Troponin negative x2, flat at 5.  Lipase is normal.  UA with moderate hemoglobin, ketonuria, and proteinuria.  Risk OTC drugs. Prescription drug management.   Overall  patient's work-up is reassuring.  She was administered  DuoNeb and GI cocktail with some improvement in her symptoms.  While exact etiology of her symptoms remains unclear does not appear to be any emergent problem at this time.  Clinical concern for emergent medical etiology would warrant further ED work-up or inpatient management is exceedingly low. I encouraged her to continue to take her PPI at home and follow-up closely in outpatient setting with her PCP for reevaluation of her kidney function and urine test.    Denise Knight voiced understanding of her medical evaluation and treatment plan. Each of their questions answered to their expressed satisfaction.  Return precautions were given.  Patient is well-appearing, stable, and was discharged in good condition.  This chart was dictated using voice recognition software, Dragon. Despite the best efforts of this provider to proofread and correct errors, errors may still occur which can change documentation meaning.   Final Clinical Impression(s) / ED Diagnoses Final diagnoses:  Chest pain, unspecified type    Rx / DC Orders ED Discharge Orders          Ordered    Ambulatory referral to Cardiology       Comments: If you have not heard from the Cardiology office within the next 72 hours please call [phone].   04/26/22 0335              Donja Tipping, Gypsy Balsam, PA-C 04/26/22 0715    Ripley Fraise, MD 04/26/22 [phone]

## 2022-04-25 NOTE — ED Provider Triage Note (Signed)
Emergency Medicine Provider Triage Evaluation Note  Denise Knight , a 56 y.o. female  was evaluated in triage.  Pt complains of right sided chest pain.  Pt reports sudden onset of chest.  Review of Systems  Positive: Nausea  vomited once Negative: fever  Physical Exam  BP 111/90 (BP Location: Right Arm)   Pulse (!) 102   Temp 98.6 F (37 C) (Oral)   Resp (!) 24   Ht 5' 8"  (1.727 m)   Wt 88.5 kg   SpO2 98%   BMI 29.65 kg/m  Gen:   Awake, no distress   Resp:  Normal effort  MSK:   Moves extremities without difficulty  Other:    Medical Decision Making  Medically screening exam initiated at 2:20 PM.  Appropriate orders placed.  Denise Knight was informed that the remainder of the evaluation will be completed by another provider, this initial triage assessment does not replace that evaluation, and the importance of remaining in the ED until their evaluation is complete.     Denise Knight, [place] 04/25/22 1428

## 2022-04-25 NOTE — ED Triage Notes (Signed)
Patient reports chest pain started 45 mins ago with chest pain and sob.  Reports had vomiting and sweating. Patient hyperventilating in triage.  Hx of HTN

## 2022-04-26 LAB — URINALYSIS, ROUTINE W REFLEX MICROSCOPIC
Bilirubin Urine: NEGATIVE
Glucose, UA: NEGATIVE mg/dL
Ketones, ur: 20 mg/dL — AB
Nitrite: NEGATIVE
Protein, ur: 30 mg/dL — AB
Specific Gravity, Urine: 1.019 (ref 1.005–1.030)
pH: 5 (ref 5.0–8.0)

## 2022-04-26 MED ORDER — LIDOCAINE VISCOUS HCL 2 % MT SOLN
15.0000 mL | Freq: Once | OROMUCOSAL | Status: AC
  Administered 2022-04-26: 15 mL via ORAL
  Filled 2022-04-26: qty 15

## 2022-04-26 MED ORDER — ALUM & MAG HYDROXIDE-SIMETH 200-200-20 MG/5ML PO SUSP
30.0000 mL | Freq: Once | ORAL | Status: AC
  Administered 2022-04-26: 30 mL via ORAL
  Filled 2022-04-26: qty 30

## 2022-04-26 NOTE — ED Notes (Signed)
Patient reminded of the need for a urine sample

## 2022-04-26 NOTE — Discharge Instructions (Addendum)
You were seen in the ER today for your chest discomfort.  Your physical exam, vital signs, and blood work are very reassuring.  There is no evidence of any stress on your heart or any problem with your heart or your lungs at this time.  You had some abnormalities in your kidney function test as well as your urine studies.  Please follow-up closely with your primary care doctor for reevaluation of these tests in the next week.  In the meantime please increase your hydration with noncaffeinated , primarily water.  Regarding your episodes of chest pain, there is no appear to be any emergent problem at this time but I do feel you would benefit from following up with the cardiologist listed below.  Please call their office tomorrow to schedule outpatient follow-up.  Return to the ER with any new severe symptoms and continue to take your omeprazole as previously prescribed.

## 2022-04-26 NOTE — ED Notes (Signed)
Pt provided w [place] sandwich, crackers, and gingerale

## 2022-05-01 ENCOUNTER — Ambulatory Visit: Admitting: Internal Medicine

## 2022-05-30 ENCOUNTER — Other Ambulatory Visit: Payer: Self-pay | Admitting: Family Medicine

## 2022-05-30 DIAGNOSIS — Z1231 Encounter for screening mammogram for malignant neoplasm of breast: Secondary | ICD-10-CM

## 2022-06-26 ENCOUNTER — Encounter (HOSPITAL_COMMUNITY): Payer: Self-pay | Admitting: Emergency Medicine

## 2022-06-26 ENCOUNTER — Emergency Department (HOSPITAL_COMMUNITY)
Admission: EM | Admit: 2022-06-26 | Discharge: 2022-06-27 | Discharge status: Against Medical Advice | Attending: Emergency Medicine | Admitting: Emergency Medicine

## 2022-06-26 ENCOUNTER — Emergency Department (HOSPITAL_COMMUNITY)

## 2022-06-26 DIAGNOSIS — R112 Nausea with vomiting, unspecified: Secondary | ICD-10-CM | POA: Insufficient documentation

## 2022-06-26 DIAGNOSIS — R200 Anesthesia of skin: Secondary | ICD-10-CM | POA: Diagnosis not present

## 2022-06-26 DIAGNOSIS — Z5321 Procedure and treatment not carried out due to patient leaving prior to being seen by health care provider: Secondary | ICD-10-CM | POA: Insufficient documentation

## 2022-06-26 DIAGNOSIS — R109 Unspecified abdominal pain: Secondary | ICD-10-CM | POA: Diagnosis not present

## 2022-06-26 DIAGNOSIS — H538 Other visual disturbances: Secondary | ICD-10-CM | POA: Insufficient documentation

## 2022-06-26 DIAGNOSIS — R519 Headache, unspecified: Secondary | ICD-10-CM | POA: Diagnosis not present

## 2022-06-26 LAB — URINALYSIS, ROUTINE W REFLEX MICROSCOPIC
Bilirubin Urine: NEGATIVE
Glucose, UA: NEGATIVE mg/dL
Ketones, ur: NEGATIVE mg/dL
Nitrite: NEGATIVE
Protein, ur: 100 mg/dL — AB
Specific Gravity, Urine: 1.009 (ref 1.005–1.030)
pH: 6 (ref 5.0–8.0)

## 2022-06-26 LAB — CBC
HCT: 47.1 % — ABNORMAL HIGH (ref 36.0–46.0)
Hemoglobin: 14.9 g/dL (ref 12.0–15.0)
MCH: 26.2 pg (ref 26.0–34.0)
MCHC: 31.6 g/dL (ref 30.0–36.0)
MCV: 82.8 fL (ref 80.0–100.0)
Platelets: 324 10*3/uL (ref 150–400)
RBC: 5.69 MIL/uL — ABNORMAL HIGH (ref 3.87–5.11)
RDW: 15.9 % — ABNORMAL HIGH (ref 11.5–15.5)
WBC: 8.4 10*3/uL (ref 4.0–10.5)
nRBC: 0 % (ref 0.0–0.2)

## 2022-06-26 LAB — COMPREHENSIVE METABOLIC PANEL
ALT: 19 U/L (ref 0–44)
AST: 22 U/L (ref 15–41)
Albumin: 4 g/dL (ref 3.5–5.0)
Alkaline Phosphatase: 64 U/L (ref 38–126)
Anion gap: 12 (ref 5–15)
BUN: 10 mg/dL (ref 6–20)
CO2: 22 mmol/L (ref 22–32)
Calcium: 9.6 mg/dL (ref 8.9–10.3)
Chloride: 108 mmol/L (ref 98–111)
Creatinine, Ser: 0.98 mg/dL (ref 0.44–1.00)
GFR, Estimated: >60 mL/min (ref >60)
Glucose, Bld: 94 mg/dL (ref 70–99)
Potassium: 4.1 mmol/L (ref 3.5–5.1)
Sodium: 142 mmol/L (ref 135–145)
Total Bilirubin: 0.5 mg/dL (ref 0.3–1.2)
Total Protein: 7.8 g/dL (ref 6.5–8.1)

## 2022-06-26 LAB — LIPASE, BLOOD: Lipase: 26 U/L (ref 11–51)

## 2022-06-26 LAB — TROPONIN I (HIGH SENSITIVITY): Troponin I (High Sensitivity): 6 ng/L (ref <18)

## 2022-06-26 MED ORDER — IOHEXOL 350 MG/ML SOLN
100.0000 mL | Freq: Once | INTRAVENOUS | Status: AC | PRN
  Administered 2022-06-26: 100 mL via INTRAVENOUS

## 2022-06-26 MED ORDER — ONDANSETRON 4 MG PO TBDP: 4.0000 mg | ORAL_TABLET | Freq: Once | ORAL | Status: DC | PRN

## 2022-06-26 NOTE — ED Triage Notes (Signed)
BIB EMS from home.  Pt has abdominal pain, n/v, dizziness, numbness in her left forearm.  BP 122/80, HR 84  NSR on 12 lead with EMS.  CBG 98.

## 2022-06-26 NOTE — ED Provider Triage Note (Signed)
Emergency Medicine Provider Triage Evaluation Note  Denise Knight , a 56 y.o. female  was evaluated in triage.  Pt complains of abdominal pain, headache, and numbness/weakness to left side of her body.  Reports that she has been dealing with periumbilical abdominal pain for the last 2 weeks.  Pain became worse today.  Patient endorses having nausea and vomiting today.  Patient denies any hematemesis or coffee-ground emesis.  Patient reports that she has not had a bowel movement in 2 weeks.  States that she has been passing flatus.  Patient reports that yesterday she started having numbness and weakness to her left arm.  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  Patient also endorses pain to her left upper extremity.  Patient states that her headache started approximate 45 minutes prior to arriving in the emergency department.  Pain is located to the right frontotemporal aspect of her head.  Patient endorses bilateral blurred vision.  Review of Systems  Positive: Abdominal pain, nausea, vomiting, constipation, blood in stool, numbness/weakness, blurred vision, urinary frequency Negative: Diarrhea, melena, hematemesis, coffee-ground emesis, dysuria, vaginal pain, vaginal bleeding, vaginal discharge, vision loss, diplopia, facial asymmetry, dysarthria  Physical Exam  BP (!) 130/100   Pulse 87   Temp 98 F (36.7 C) (Oral)   Resp 16   SpO2 96%  Gen:   Awake, no distress   Resp:  Normal effort  MSK:   Moves extremities without difficulty  Other:  Abdomen soft, nondistended, tenderness to periumbilical region.  CN II through XII intact.  +5 strength to right upper and lower extremities.  +4 strength to left upper and lower extremity.  Decree sensation to light touch throughout left upper extremity.  Pronator drift normal.  Medical Decision Making  Medically screening exam initiated at 1:31 PM.  Appropriate orders  placed.  Athenia Rys was informed that the remainder of the evaluation will be completed by another provider, this initial triage assessment does not replace that evaluation, and the importance of remaining in the ED until their evaluation is complete.  Due to periumbilical pain concern for possible acute appendicitis, will obtain CT imaging for further evaluation.  Due to patient's reports of headache and numbness/weakness will obtain CTA of head neck.  Patient is outside stroke contrast this time.  Patient care discussed with attending physician Dr.Messick   Loni Beckwith, PA-C 06/26/22 1338

## 2022-06-27 NOTE — ED Notes (Signed)
Pt wanted IV taken out. Pt stated the wait is too long. Pt was seen walking out of the ED.

## 2022-07-10 ENCOUNTER — Ambulatory Visit
Admission: RE | Admit: 2022-07-10 | Discharge: 2022-07-10 | Disposition: A | Discharge status: Home / Self Care | Source: Ambulatory Visit | Attending: Family Medicine | Admitting: Family Medicine

## 2022-07-10 DIAGNOSIS — Z1231 Encounter for screening mammogram for malignant neoplasm of breast: Secondary | ICD-10-CM

## 2022-08-19 ENCOUNTER — Other Ambulatory Visit: Payer: Self-pay | Admitting: General Surgery

## 2022-12-10 NOTE — Therapy (Addendum)
OUTPATIENT PHYSICAL THERAPY EVALUATION   Patient Name: Denise Knight MRN: [id number] DOB:10/22/1966, 57 y.o., female Today's Date: 12/11/2022  END OF SESSION:  PT End of Session - 12/11/22 1251     Visit Number 1    Number of Visits 20    Date for PT Re-Evaluation 02/19/23    Authorization Type [facility]    PT Start Time 1259    PT Stop Time [id number]    PT Time Calculation (min) 35 min    Activity Tolerance Patient tolerated treatment well    Behavior During Therapy [facility] for tasks assessed/performed             Past Medical History:  Diagnosis Date   Allergy    seasonal   Anxiety    Arthritis    "left knee" (07/05/2016)   Asthma    Bipolar 1 disorder ([place])    Depression    Diabetes mellitus without complication ([place])    pre-   GERD (gastroesophageal reflux disease)    Hypertension    MI (mitral incompetence)    Post traumatic stress disorder    Schizophrenia ([place])    Stomach ulcer    Substance abuse ([place])    cocaine quit july 2020   Suicide attempt [facility])    Past Surgical History:  Procedure Laterality Date   CARDIAC CATHETERIZATION  07/05/2016   CARDIAC CATHETERIZATION N/A 07/05/2016   Procedure: Left Heart Cath and Coronary Angiography;  Surgeon: Adrian Prows, MD;  Location: [place] [facility];  Service: Cardiovascular;  Laterality: N/A;   CYST EXCISION Right    "wrist"   DILATION AND CURETTAGE OF UTERUS     FOOT SURGERY Bilateral    "corns removed"   LEFT HEART CATH AND CORONARY ANGIOGRAPHY N/A 12/24/2017   Procedure: LEFT HEART CATH AND CORONARY ANGIOGRAPHY;  Surgeon: Nigel Mormon, MD;  Location: [place] [facility];  Service: Cardiovascular;  Laterality: N/A;   TUBAL LIGATION     Patient Active Problem List   Diagnosis Date Noted   Cocaine-induced mood disorder ([place]) 01/15/2022   Asthma exacerbation, mild 09/20/2021   GERD (gastroesophageal reflux disease)    Primary hypertension    Polysubstance abuse ([place])    Bipolar II disorder, most  recent episode major depressive ([place]) 09/04/2021   Vitamin D deficiency 09/03/2021   Cannabis dependence with current use ([place]) 09/02/2021   MDD (major depressive disorder) 01/12/2020   Severe recurrent major depression without psychotic features ([place]) 01/12/2020   Alcohol use disorder, moderate, dependence ([place]) 01/12/2020   Cocaine abuse ([place]) 12/27/2017   Rectal bleeding 12/27/2017   NSTEMI (non-ST elevated myocardial infarction) ([place]) 12/23/2017   Acute viral bronchitis 06/14/2017   Asthma exacerbation 06/14/2017   Chest pain 07/05/2016    PCP: [facility]  REFERRING PROVIDER: Jorge Ny, PA-C  REFERRING DIAG:M79.662 (ICD-10-CM) - Pain in left lower leg  THERAPY DIAG:  Pain in left lower leg  Muscle weakness (generalized)  Difficulty in walking, not elsewhere classified  Rationale for Evaluation and Treatment: Rehabilitation  ONSET DATE: Oct 2023  SUBJECTIVE:   SUBJECTIVE STATEMENT: She indicated complaints in Lt knee down towards Lt foot since surgery was performed.  Complaints in side and back of Lt lower leg mostly. Notices complaints at rest and with movement.  No complaints prior to surgery.   PERTINENT HISTORY: Surgery was done in Oct 2023 for lipoma removal.  DM, GERD, HTN.  History of arthroscopic surgery Rt knee PAIN:  NPRS scale: at  worst 10/10, at current 5/10.  Pain location: Lt lower leg Pain description: sharp Aggravating factors: weather changes (cloudy,raining, cold), going up/down stairs, walking Relieving factors: muscle relaxer, ice  PRECAUTIONS: None  WEIGHT BEARING RESTRICTIONS: No  FALLS:  Has patient fallen in last 6 months? No  LIVING ENVIRONMENT: Lives in: House/apartment Stairs: stairs with porch 4 with rail on Lt going up.    OCCUPATION: no work indicated  PLOF: Independent, watch tv, shopping.   PATIENT GOALS:Reduce pain, improve walking/stairs  OBJECTIVE:   PATIENT SURVEYS:  12/11/2022 FOTO  intake: 38   predicted:  60  COGNITION: 12/11/2022 Overall cognitive status: WFL    SENSATION: 12/11/2022 No specific testing today  MUSCLE LENGTH: 12/11/2022 No specific measurement today  POSTURE:  12/11/2022 Unremarkable in relationship to Lt leg complaints  PALPATION: 12/11/2022 Mild tenderness in distal Lt quad.    LUMBAR ROM:   ROM AROM  12/11/2022  Flexion Movement to mid shin with no impact on Lt leg symptoms  Extension 50 % WFL with no impact of Lt leg symptoms  Right lateral flexion   Left lateral flexion   Right rotation   Left rotation    (Blank rows = not tested)    LOWER EXTREMITY ROM:   ROM Right 12/11/2022 Left 12/11/2022  Hip flexion    Hip extension    Hip abduction    Hip adduction    Hip internal rotation    Hip external rotation    Knee flexion WFL 131 c pain at end range in AROM heel slide  Knee extension 0 0 AROM in LAQ c pain in movement  Ankle dorsiflexion    Ankle plantarflexion    Ankle inversion    Ankle eversion     (Blank rows = not tested)  LOWER EXTREMITY MMT:  MMT Right 12/11/2022 Left 12/11/2022  Hip flexion 5/5 5/5  Hip extension    Hip abduction    Hip adduction    Hip internal rotation    Hip external rotation    Knee flexion 5/5 5/5  Knee extension 4/5 4/5  Ankle dorsiflexion 5/5 5/5  Ankle plantarflexion    Ankle inversion 5/5 5/5  Ankle eversion 5/5 5/5   (Blank rows = not tested)  LOWER EXTREMITY SPECIAL TESTS:  12/11/2022 No specific testing performed  FUNCTIONAL TESTS:  12/11/2022 18 inch chair transfer: s UE with increased difficulty, poor control in eccentric lowering Lt SLS:  5 seconds Rt SLS: 8 seconds  GAIT: 12/11/2022 Independent ambulation on level surfaces in clinic   TODAY'S TREATMENT                                                                          DATE: 12/11/2022 Therex:    HEP instruction/performance c cues for techniques, handout provided.  Trial set performed of each for  comprehension and symptom assessment.  See below for exercise list  PATIENT EDUCATION:  Education details: HEP, POC Person educated: Patient Education method: Explanation, Demonstration, Verbal cues, and Handouts Education comprehension: verbalized understanding, returned demonstration, and verbal cues required  HOME EXERCISE PROGRAM: Access Code: [email or web address] URL: [email or web address] Date: 12/11/2022 Prepared by: Scot Jun  Exercises - Supine Quadricep Sets  - 2-3 x daily - 7 x  weekly - 1 sets - 10 reps - 5 hold - Seated Quad Set (Mirrored)  - 2-3 x daily - 7 x weekly - 1 sets - 10 reps - 5 hold - Seated Straight Leg Heel Taps  - 1-2 x daily - 7 x weekly - 1-2 sets - 10 reps - Supine Heel Slide (Mirrored)  - 3-5 x daily - 7 x weekly - 1-2 sets - 10 reps - 2 hold - Seated Long Arc Quad (Mirrored)  - 3-5 x daily - 7 x weekly - 1-2 sets - 10 reps - 2 hold  ASSESSMENT:  CLINICAL IMPRESSION: Patient is a 57 y.o. who comes to clinic with complaints of Lt lower pain with mobility, strength and movement coordination deficits that impair their ability to perform usual daily and recreational functional activities without increase difficulty/symptoms at this time.  Patient to benefit from skilled PT services to address impairments and limitations to improve to previous level of function without restriction secondary to condition.   OBJECTIVE IMPAIRMENTS: decreased activity tolerance, decreased balance, decreased coordination, decreased endurance, decreased mobility, difficulty walking, decreased ROM, decreased strength, impaired perceived functional ability, impaired flexibility, improper body mechanics, and pain.   ACTIVITY LIMITATIONS: carrying, lifting, bending, sitting, standing, squatting, sleeping, stairs, transfers, and locomotion level  PARTICIPATION LIMITATIONS: cleaning, laundry, interpersonal relationship, shopping, and community activity  PERSONAL FACTORS: DM, GERD,  HTN, concurrent history of Rt knee complaints post surgery are also affecting patient's functional outcome.   REHAB POTENTIAL: Good  CLINICAL DECISION MAKING: Stable/uncomplicated  EVALUATION COMPLEXITY: Low   GOALS: Goals reviewed with patient? Yes  SHORT TERM GOALS: (target date for Short term goals are 3 weeks 01/01/2023)   1.  Patient will demonstrate independent use of home exercise program to maintain progress from in clinic treatments.  Goal status: New  LONG TERM GOALS: (target dates for all long term goals are 10 weeks  02/19/2023 )   1. Patient will demonstrate/report pain at worst less than or equal to 2/10 to facilitate minimal limitation in daily activity secondary to pain symptoms.  Goal status: New   2. Patient will demonstrate independent use of home exercise program to facilitate ability to maintain/progress functional gains from skilled physical therapy services.  Goal status: New   3. Patient will demonstrate FOTO outcome > or = 60 % to indicate reduced disability due to condition.  Goal status: New   4.  Patient will demonstrate bilateral  LE MMT 5/5 throughout to faciltiate usual transfers, stairs, squatting at [facility] for daily life.   Goal status: New   5.  Patient will demonstrate Lt knee flexion WFL s symptoms to facilitate usual mobility in daily life activity.  Goal status: New   6.  Patient will demonstrate ascending/descending stairs with single hand rail to facilitate household entry and community integration.  Goal status: New   7.  Patient will demonstrate 18 inch chair sit to stand s UE s deviation.  Goal Status: New   PLAN:  PT FREQUENCY: 1-2x/week  PT DURATION: 10 weeks  PLANNED INTERVENTIONS: Therapeutic exercises, Therapeutic activity, Neuro Muscular re-education, Balance training, Gait training, Patient/Family education, Joint mobilization, Stair training, DME instructions, Dry Needling, Electrical stimulation, Traction,  Cryotherapy, vasopneumatic deviceMoist heat, Taping, Ultrasound, Ionotophoresis 21m/ml Dexamethasone, and Manual therapy.  All included unless contraindicated  PLAN FOR NEXT SESSION: Review HEP knowledge/results.   Progressive quad strengthening bilateral to improve towards WB stair/strengthening.    MScot Jun PT, DPT, OCS, ATC 12/11/22  1:42 PM

## 2022-12-11 ENCOUNTER — Encounter: Payer: Self-pay | Admitting: Rehabilitative and Restorative Service Providers"

## 2022-12-11 ENCOUNTER — Ambulatory Visit (INDEPENDENT_AMBULATORY_CARE_PROVIDER_SITE_OTHER): Admitting: Rehabilitative and Restorative Service Providers"

## 2022-12-11 ENCOUNTER — Other Ambulatory Visit: Payer: Self-pay

## 2022-12-11 DIAGNOSIS — R262 Difficulty in walking, not elsewhere classified: Secondary | ICD-10-CM | POA: Diagnosis not present

## 2022-12-11 DIAGNOSIS — M6281 Muscle weakness (generalized): Secondary | ICD-10-CM

## 2022-12-11 DIAGNOSIS — M79662 Pain in left lower leg: Secondary | ICD-10-CM | POA: Diagnosis not present

## 2022-12-23 ENCOUNTER — Ambulatory Visit (INDEPENDENT_AMBULATORY_CARE_PROVIDER_SITE_OTHER): Admitting: Rehabilitative and Restorative Service Providers"

## 2022-12-23 ENCOUNTER — Encounter: Payer: Self-pay | Admitting: Rehabilitative and Restorative Service Providers"

## 2022-12-23 DIAGNOSIS — M6281 Muscle weakness (generalized): Secondary | ICD-10-CM | POA: Diagnosis not present

## 2022-12-23 DIAGNOSIS — R262 Difficulty in walking, not elsewhere classified: Secondary | ICD-10-CM | POA: Diagnosis not present

## 2022-12-23 DIAGNOSIS — M79662 Pain in left lower leg: Secondary | ICD-10-CM | POA: Diagnosis not present

## 2022-12-23 NOTE — Therapy (Signed)
OUTPATIENT PHYSICAL THERAPY TREATMENT   Patient Name: Denise Knight MRN: [id number] DOB:[birth year], 57 y.o., female Today's Date: 12/23/2022  END OF SESSION:  PT End of Session - 12/23/22 1315     Visit Number 2    Number of Visits 20    Date for PT Re-Evaluation 02/19/23    Authorization Type [facility]    PT Start Time 1259    PT Stop Time 1338    PT Time Calculation (min) 39 min    Activity Tolerance Patient tolerated treatment well    Behavior During Therapy [facility] for tasks assessed/performed              Past Medical History:  Diagnosis Date   Allergy    seasonal   Anxiety    Arthritis    "left knee" (07/05/2016)   Asthma    Bipolar 1 disorder ([place])    Depression    Diabetes mellitus without complication ([facility])    pre-   GERD (gastroesophageal reflux disease)    Hypertension    MI (mitral incompetence)    Post traumatic stress disorder    Schizophrenia ([place])    Stomach ulcer    Substance abuse ([place])    cocaine quit july 2020   Suicide attempt [facility])    Past Surgical History:  Procedure Laterality Date   CARDIAC CATHETERIZATION  07/05/2016   CARDIAC CATHETERIZATION N/A 07/05/2016   Procedure: Left Heart Cath and Coronary Angiography;  Surgeon: Adrian Prows, MD;  Location: [place] [facility];  Service: Cardiovascular;  Laterality: N/A;   CYST EXCISION Right    "wrist"   DILATION AND CURETTAGE OF UTERUS     FOOT SURGERY Bilateral    "corns removed"   LEFT HEART CATH AND CORONARY ANGIOGRAPHY N/A 12/24/2017   Procedure: LEFT HEART CATH AND CORONARY ANGIOGRAPHY;  Surgeon: Nigel Mormon, MD;  Location: [place] [facility];  Service: Cardiovascular;  Laterality: N/A;   TUBAL LIGATION     Patient Active Problem List   Diagnosis Date Noted   Cocaine-induced mood disorder ([place]) 01/15/2022   Asthma exacerbation, mild 09/20/2021   GERD (gastroesophageal reflux disease)    Primary hypertension    Polysubstance abuse ([place])    Bipolar II disorder, most recent  episode major depressive ([place]) 09/04/2021   Vitamin D deficiency 09/03/2021   Cannabis dependence with current use ([place]) 09/02/2021   MDD (major depressive disorder) 01/12/2020   Severe recurrent major depression without psychotic features ([place]) 01/12/2020   Alcohol use disorder, moderate, dependence ([place]) 01/12/2020   Cocaine abuse ([place]) 12/27/2017   Rectal bleeding 12/27/2017   NSTEMI (non-ST elevated myocardial infarction) ([place]) 12/23/2017   Acute viral bronchitis 06/14/2017   Asthma exacerbation 06/14/2017   Chest pain 07/05/2016    PCP: [facility]  REFERRING PROVIDER: Jorge Ny, PA-C  REFERRING DIAG:M79.662 (ICD-10-CM) - Pain in left lower leg  THERAPY DIAG:  Pain in left lower leg  Muscle weakness (generalized)  Difficulty in walking, not elsewhere classified  Rationale for Evaluation and Treatment: Rehabilitation  ONSET DATE: Oct 2023  SUBJECTIVE:   SUBJECTIVE STATEMENT: She indicated pain after prolonged walking.  She indicated today having pain around 7/10  PERTINENT HISTORY: Surgery was done in Oct 2023 for lipoma removal.  DM, GERD, HTN.  History of arthroscopic surgery Rt knee PAIN:  NPRS scale: 7/10  Pain location: Lt lower leg Pain description: sharp Aggravating factors: weather changes (cloudy,raining, cold), going up/down stairs, walking Relieving factors: muscle relaxer, ice  PRECAUTIONS: None  WEIGHT BEARING RESTRICTIONS: No  FALLS:  Has patient fallen in last 6 months? No  LIVING ENVIRONMENT: Lives in: House/apartment Stairs: stairs with porch 4 with rail on Lt going up.    OCCUPATION: no work indicated  PLOF: Independent, watch tv, shopping.   PATIENT GOALS:Reduce pain, improve walking/stairs  OBJECTIVE:   PATIENT SURVEYS:  12/11/2022 FOTO intake: 38   predicted:  60  COGNITION: 12/11/2022 Overall cognitive status: WFL    SENSATION: 12/11/2022 No specific testing today  MUSCLE  LENGTH: 12/11/2022 No specific measurement today  POSTURE:  12/11/2022 Unremarkable in relationship to Lt leg complaints  PALPATION: 12/11/2022 Mild tenderness in distal Lt quad.    LUMBAR ROM:   ROM AROM  12/11/2022  Flexion Movement to mid shin with no impact on Lt leg symptoms  Extension 50 % WFL with no impact of Lt leg symptoms  Right lateral flexion   Left lateral flexion   Right rotation   Left rotation    (Blank rows = not tested)    LOWER EXTREMITY ROM:   ROM Right 12/11/2022 Left 12/11/2022  Hip flexion    Hip extension    Hip abduction    Hip adduction    Hip internal rotation    Hip external rotation    Knee flexion WFL 131 c pain at end range in AROM heel slide  Knee extension 0 0 AROM in LAQ c pain in movement  Ankle dorsiflexion    Ankle plantarflexion    Ankle inversion    Ankle eversion     (Blank rows = not tested)  LOWER EXTREMITY MMT:  MMT Right 12/11/2022 Left 12/11/2022  Hip flexion 5/5 5/5  Hip extension    Hip abduction    Hip adduction    Hip internal rotation    Hip external rotation    Knee flexion 5/5 5/5  Knee extension 4/5 4/5  Ankle dorsiflexion 5/5 5/5  Ankle plantarflexion    Ankle inversion 5/5 5/5  Ankle eversion 5/5 5/5   (Blank rows = not tested)  LOWER EXTREMITY SPECIAL TESTS:  12/11/2022 No specific testing performed  FUNCTIONAL TESTS:  12/11/2022 18 inch chair transfer: s UE with increased difficulty, poor control in eccentric lowering Lt SLS:  5 seconds Rt SLS: 8 seconds  GAIT: 12/11/2022 Independent ambulation on level surfaces in clinic   TODAY'S TREATMENT                                                             DATE: 12/23/2022 Therex: Nustep lvl 5 8 mins UE/LE  Seated Lt quad set 5 sec hold x 3 Seated SLR c quad set 2 x 10, performed bilaterally Supine passive hamstring SLR stretch 20 sec x 3 Lt  Standing alternating heel/toe lift x 15 bilateral with light hand assist on bar Sit to stand to sit  from 18 inch chair c airex pad c slow lowering focus x 10  Lateral step up /down WB on Lt leg x 10 Fwd step up 4 inch step x 10 WB on Lt leg   TODAY'S TREATMENT  DATE: 12/11/2022 Therex:    HEP instruction/performance c cues for techniques, handout provided.  Trial set performed of each for comprehension and symptom assessment.  See below for exercise list  PATIENT EDUCATION:  Education details: HEP, POC Person educated: Patient Education method: Explanation, Demonstration, Verbal cues, and Handouts Education comprehension: verbalized understanding, returned demonstration, and verbal cues required  HOME EXERCISE PROGRAM: Access Code: [email or web address] URL: [email or web address] Date: 12/11/2022 Prepared by: Scot Jun  Exercises - Supine Quadricep Sets  - 2-3 x daily - 7 x weekly - 1 sets - 10 reps - 5 hold - Seated Quad Set (Mirrored)  - 2-3 x daily - 7 x weekly - 1 sets - 10 reps - 5 hold - Seated Straight Leg Heel Taps  - 1-2 x daily - 7 x weekly - 1-2 sets - 10 reps - Supine Heel Slide (Mirrored)  - 3-5 x daily - 7 x weekly - 1-2 sets - 10 reps - 2 hold - Seated Long Arc Quad (Mirrored)  - 3-5 x daily - 7 x weekly - 1-2 sets - 10 reps - 2 hold  ASSESSMENT:  CLINICAL IMPRESSION: Able to perform intervention in clinic without worsened chief complaint.  Fatigue noted and some various times of distal quad, lateral quad feelings reported (SLR, transfers).  4 inch step activity was good overall without any UE assist on rail.  Continued skilled PT services warranted at this time.   OBJECTIVE IMPAIRMENTS: decreased activity tolerance, decreased balance, decreased coordination, decreased endurance, decreased mobility, difficulty walking, decreased ROM, decreased strength, impaired perceived functional ability, impaired flexibility, improper body mechanics, and pain.   ACTIVITY LIMITATIONS: carrying, lifting, bending,  sitting, standing, squatting, sleeping, stairs, transfers, and locomotion level  PARTICIPATION LIMITATIONS: cleaning, laundry, interpersonal relationship, shopping, and community activity  PERSONAL FACTORS: DM, GERD, HTN, concurrent history of Rt knee complaints post surgery are also affecting patient's functional outcome.   REHAB POTENTIAL: Good  CLINICAL DECISION MAKING: Stable/uncomplicated  EVALUATION COMPLEXITY: Low   GOALS: Goals reviewed with patient? Yes  SHORT TERM GOALS: (target date for Short term goals are 3 weeks 01/01/2023)   1.  Patient will demonstrate independent use of home exercise program to maintain progress from in clinic treatments.  Goal status: on going 12/23/2022  LONG TERM GOALS: (target dates for all long term goals are 10 weeks  02/19/2023 )   1. Patient will demonstrate/report pain at worst less than or equal to 2/10 to facilitate minimal limitation in daily activity secondary to pain symptoms.  Goal status: New   2. Patient will demonstrate independent use of home exercise program to facilitate ability to maintain/progress functional gains from skilled physical therapy services.  Goal status: New   3. Patient will demonstrate FOTO outcome > or = 60 % to indicate reduced disability due to condition.  Goal status: New   4.  Patient will demonstrate bilateral  LE MMT 5/5 throughout to faciltiate usual transfers, stairs, squatting at [facility] for daily life.   Goal status: New   5.  Patient will demonstrate Lt knee flexion WFL s symptoms to facilitate usual mobility in daily life activity.  Goal status: New   6.  Patient will demonstrate ascending/descending stairs with single hand rail to facilitate household entry and community integration.  Goal status: New   7.  Patient will demonstrate 18 inch chair sit to stand s UE s deviation.  Goal Status: New   PLAN:  PT FREQUENCY: 1-2x/week  PT DURATION: 10 weeks  PLANNED INTERVENTIONS:  Therapeutic exercises, Therapeutic activity, Neuro Muscular re-education, Balance training, Gait training, Patient/Family education, Joint mobilization, Stair training, DME instructions, Dry Needling, Electrical stimulation, Traction, Cryotherapy, vasopneumatic deviceMoist heat, Taping, Ultrasound, Ionotophoresis 66m/ml Dexamethasone, and Manual therapy.  All included unless contraindicated  PLAN FOR NEXT SESSION: Progressive strengthening, static balance.   MScot Jun PT, DPT, OCS, ATC 12/23/22  1:41 PM

## 2022-12-25 ENCOUNTER — Ambulatory Visit (INDEPENDENT_AMBULATORY_CARE_PROVIDER_SITE_OTHER): Admitting: Rehabilitative and Restorative Service Providers"

## 2022-12-25 ENCOUNTER — Encounter: Payer: Self-pay | Admitting: Rehabilitative and Restorative Service Providers"

## 2022-12-25 DIAGNOSIS — M79662 Pain in left lower leg: Secondary | ICD-10-CM | POA: Diagnosis not present

## 2022-12-25 DIAGNOSIS — R262 Difficulty in walking, not elsewhere classified: Secondary | ICD-10-CM

## 2022-12-25 DIAGNOSIS — M6281 Muscle weakness (generalized): Secondary | ICD-10-CM

## 2022-12-25 NOTE — Therapy (Addendum)
OUTPATIENT PHYSICAL THERAPY TREATMENT /DISCHARGE   Patient Name: Denise Knight MRN: [id number] DOB:[birth year], 57 y.o., female Today's Date: 12/25/2022  END OF SESSION:  PT End of Session - 12/25/22 1305     Visit Number 3    Number of Visits 20    Date for PT Re-Evaluation 02/19/23    Authorization Type [facility]    PT Start Time 1300    PT Stop Time 1339    PT Time Calculation (min) 39 min    Activity Tolerance Patient tolerated treatment well    Behavior During Therapy [facility] for tasks assessed/performed               Past Medical History:  Diagnosis Date   Allergy    seasonal   Anxiety    Arthritis    "left knee" (07/05/2016)   Asthma    Bipolar 1 disorder ([place])    Depression    Diabetes mellitus without complication (HCC)    pre-   GERD (gastroesophageal reflux disease)    Hypertension    MI (mitral incompetence)    Post traumatic stress disorder    Schizophrenia ([place])    Stomach ulcer    Substance abuse ([place])    cocaine quit july 2020   Suicide attempt [facility])    Past Surgical History:  Procedure Laterality Date   CARDIAC CATHETERIZATION  07/05/2016   CARDIAC CATHETERIZATION N/A 07/05/2016   Procedure: Left Heart Cath and Coronary Angiography;  Surgeon: Adrian Prows, MD;  Location: [place] [facility];  Service: Cardiovascular;  Laterality: N/A;   CYST EXCISION Right    "wrist"   DILATION AND CURETTAGE OF UTERUS     FOOT SURGERY Bilateral    "corns removed"   LEFT HEART CATH AND CORONARY ANGIOGRAPHY N/A 12/24/2017   Procedure: LEFT HEART CATH AND CORONARY ANGIOGRAPHY;  Surgeon: Nigel Mormon, MD;  Location: [facility];  Service: Cardiovascular;  Laterality: N/A;   TUBAL LIGATION     Patient Active Problem List   Diagnosis Date Noted   Cocaine-induced mood disorder ([place]) 01/15/2022   Asthma exacerbation, mild 09/20/2021   GERD (gastroesophageal reflux disease)    Primary hypertension    Polysubstance abuse ([place])    Bipolar II disorder,  most recent episode major depressive ([place]) 09/04/2021   Vitamin D deficiency 09/03/2021   Cannabis dependence with current use ([place]) 09/02/2021   MDD (major depressive disorder) 01/12/2020   Severe recurrent major depression without psychotic features ([place]) 01/12/2020   Alcohol use disorder, moderate, dependence ([place]) 01/12/2020   Cocaine abuse ([place]) 12/27/2017   Rectal bleeding 12/27/2017   NSTEMI (non-ST elevated myocardial infarction) ([place]) 12/23/2017   Acute viral bronchitis 06/14/2017   Asthma exacerbation 06/14/2017   Chest pain 07/05/2016    PCP: [facility]  REFERRING PROVIDER: Jorge Ny, PA-C  REFERRING DIAG:M79.662 (ICD-10-CM) - Pain in left lower leg  THERAPY DIAG:  Pain in left lower leg  Muscle weakness (generalized)  Difficulty in walking, not elsewhere classified  Rationale for Evaluation and Treatment: Rehabilitation  ONSET DATE: Oct 2023  SUBJECTIVE:   SUBJECTIVE STATEMENT: She indicated 'a little sore" after last visit but not pain increase.  Reported feeling pretty good today.    PERTINENT HISTORY: Surgery was done in Oct 2023 for lipoma removal.  DM, GERD, HTN.  History of arthroscopic surgery Rt knee PAIN:  NPRS scale: 7.5/10  Pain location: Lt lower leg Pain description: sharp Aggravating factors: weather changes (cloudy,raining, cold), going up/down  stairs, walking Relieving factors: muscle relaxer, ice  PRECAUTIONS: None  WEIGHT BEARING RESTRICTIONS: No  FALLS:  Has patient fallen in last 6 months? No  LIVING ENVIRONMENT: Lives in: House/apartment Stairs: stairs with porch 4 with rail on Lt going up.    OCCUPATION: no work indicated  PLOF: Independent, watch tv, shopping.   PATIENT GOALS:Reduce pain, improve walking/stairs  OBJECTIVE:   PATIENT SURVEYS:  12/11/2022 FOTO intake: 38   predicted:  60  COGNITION: 12/11/2022 Overall cognitive status: WFL    SENSATION: 12/11/2022 No specific testing  today  MUSCLE LENGTH: 12/11/2022 No specific measurement today  POSTURE:  12/11/2022 Unremarkable in relationship to Lt leg complaints  PALPATION: 12/11/2022 Mild tenderness in distal Lt quad.    LUMBAR ROM:   ROM AROM  12/11/2022  Flexion Movement to mid shin with no impact on Lt leg symptoms  Extension 50 % WFL with no impact of Lt leg symptoms  Right lateral flexion   Left lateral flexion   Right rotation   Left rotation    (Blank rows = not tested)    LOWER EXTREMITY ROM:   ROM Right 12/11/2022 Left 12/11/2022 Left 12/25/2022  Hip flexion     Hip extension     Hip abduction     Hip adduction     Hip internal rotation     Hip external rotation     Knee flexion WFL 131 c pain at end range in AROM heel slide   Knee extension 0 0 AROM in LAQ c pain in movement 0 AROM without complaints  Ankle dorsiflexion     Ankle plantarflexion     Ankle inversion     Ankle eversion      (Blank rows = not tested)  LOWER EXTREMITY MMT:  MMT Right 12/11/2022 Left 12/11/2022  Hip flexion 5/5 5/5  Hip extension    Hip abduction    Hip adduction    Hip internal rotation    Hip external rotation    Knee flexion 5/5 5/5  Knee extension 4/5 4/5  Ankle dorsiflexion 5/5 5/5  Ankle plantarflexion    Ankle inversion 5/5 5/5  Ankle eversion 5/5 5/5   (Blank rows = not tested)  LOWER EXTREMITY SPECIAL TESTS:  12/11/2022 No specific testing performed  FUNCTIONAL TESTS:  12/11/2022 18 inch chair transfer: s UE with increased difficulty, poor control in eccentric lowering Lt SLS:  5 seconds Rt SLS: 8 seconds  GAIT: 12/11/2022 Independent ambulation on level surfaces in clinic   TODAY'S TREATMENT                                                             DATE: 12/25/2022 Therex: Nustep lvl 6 8 mins UE/LE  Supine passive hamstring SLR stretch 20 sec x 3 Lt Supine bridge 2x 10 Standing alternating heel/toe lift x 15 bilateral with light hand assist on bar Forward step up WB  on Lt c green band TKE on top of step x 15 Seated quad set 5 sec hold x 10 Seated quad set c SLR x 15 Lt   Neuro Re-ed Standing on foam heel/toe raises alternating x 20 Fitter rocker board fwd/back light touching x 20 each way c SBA  TODAY'S TREATMENT  DATE: 12/23/2022 Therex: Nustep lvl 5 8 mins UE/LE  Seated Lt quad set 5 sec hold x 3 Seated SLR c quad set 2 x 10, performed bilaterally Supine passive hamstring SLR stretch 20 sec x 3 Lt  Standing alternating heel/toe lift x 15 bilateral with light hand assist on bar Sit to stand to sit from 18 inch chair c airex pad c slow lowering focus x 10  Lateral step up /down WB on Lt leg x 10 Fwd step up 4 inch step x 10 WB on Lt leg   TODAY'S TREATMENT                                                             DATE: 12/11/2022 Therex:    HEP instruction/performance c cues for techniques, handout provided.  Trial set performed of each for comprehension and symptom assessment.  See below for exercise list  PATIENT EDUCATION:  Education details: HEP, POC Person educated: Patient Education method: Explanation, Demonstration, Verbal cues, and Handouts Education comprehension: verbalized understanding, returned demonstration, and verbal cues required  HOME EXERCISE PROGRAM: Access Code: [email or web address] URL: [email or web address] Date: 12/25/2022 Prepared by: Scot Jun  Exercises - Supine Quadricep Sets  - 2-3 x daily - 7 x weekly - 1 sets - 10 reps - 5 hold - Seated Quad Set (Mirrored)  - 2-3 x daily - 7 x weekly - 1 sets - 10 reps - 5 hold - Seated Straight Leg Heel Taps  - 1-2 x daily - 7 x weekly - 1-2 sets - 10 reps - Supine Heel Slide (Mirrored)  - 3-5 x daily - 7 x weekly - 1-2 sets - 10 reps - 2 hold - Seated Long Arc Quad (Mirrored)  - 3-5 x daily - 7 x weekly - 1-2 sets - 10 reps - 2 hold - Supine Bridge  - 1-2 x daily - 7 x weekly - 1-2 sets - 10 reps - 2  hold  ASSESSMENT:  CLINICAL IMPRESSION: Continued focus in clinic on building strengthening for WB activity.  Some occasional complaints noted within clinic time but resolved with rest periods and no worse after.  Pt to benefit from continued strengthening and balance improvements.    OBJECTIVE IMPAIRMENTS: decreased activity tolerance, decreased balance, decreased coordination, decreased endurance, decreased mobility, difficulty walking, decreased ROM, decreased strength, impaired perceived functional ability, impaired flexibility, improper body mechanics, and pain.   ACTIVITY LIMITATIONS: carrying, lifting, bending, sitting, standing, squatting, sleeping, stairs, transfers, and locomotion level  PARTICIPATION LIMITATIONS: cleaning, laundry, interpersonal relationship, shopping, and community activity  PERSONAL FACTORS: DM, GERD, HTN, concurrent history of Rt knee complaints post surgery are also affecting patient's functional outcome.   REHAB POTENTIAL: Good  CLINICAL DECISION MAKING: Stable/uncomplicated  EVALUATION COMPLEXITY: Low   GOALS: Goals reviewed with patient? Yes  SHORT TERM GOALS: (target date for Short term goals are 3 weeks 01/01/2023)   1.  Patient will demonstrate independent use of home exercise program to maintain progress from in clinic treatments.  Goal status: on going 12/23/2022  LONG TERM GOALS: (target dates for all long term goals are 10 weeks  02/19/2023 )   1. Patient will demonstrate/report pain at worst less than or equal to 2/10 to facilitate minimal limitation in daily activity secondary to pain symptoms.  Goal status:  New   2. Patient will demonstrate independent use of home exercise program to facilitate ability to maintain/progress functional gains from skilled physical therapy services.  Goal status: New   3. Patient will demonstrate FOTO outcome > or = 60 % to indicate reduced disability due to condition.  Goal status: New   4.  Patient  will demonstrate bilateral  LE MMT 5/5 throughout to faciltiate usual transfers, stairs, squatting at [facility] for daily life.   Goal status: New   5.  Patient will demonstrate Lt knee flexion WFL s symptoms to facilitate usual mobility in daily life activity.  Goal status: New   6.  Patient will demonstrate ascending/descending stairs with single hand rail to facilitate household entry and community integration.  Goal status: New   7.  Patient will demonstrate 18 inch chair sit to stand s UE s deviation.  Goal Status: New   PLAN:  PT FREQUENCY: 1-2x/week  PT DURATION: 10 weeks  PLANNED INTERVENTIONS: Therapeutic exercises, Therapeutic activity, Neuro Muscular re-education, Balance training, Gait training, Patient/Family education, Joint mobilization, Stair training, DME instructions, Dry Needling, Electrical stimulation, Traction, Cryotherapy, vasopneumatic deviceMoist heat, Taping, Ultrasound, Ionotophoresis '4mg'$ /ml Dexamethasone, and Manual therapy.  All included unless contraindicated  PLAN FOR NEXT SESSION: Progressive strengthening, static balance improvements.   Scot Jun, PT, DPT, OCS, ATC 12/25/22  1:39 PM   PHYSICAL THERAPY DISCHARGE SUMMARY  Visits from Start of Care: 3  Current functional level related to goals / functional outcomes: See note   Remaining deficits: See note   Education / Equipment: HEP  Patient goals were not met. Patient is being discharged due to not returning since the last visit. Scot Jun, PT, DPT, OCS, ATC 01/15/23  11:32 AM

## 2022-12-30 ENCOUNTER — Telehealth: Payer: Self-pay | Admitting: Rehabilitative and Restorative Service Providers"

## 2022-12-30 ENCOUNTER — Encounter: Admitting: Rehabilitative and Restorative Service Providers"

## 2022-12-30 NOTE — Telephone Encounter (Signed)
Called patient after 15 mins no show for appointment today.  Left message and reminder of next appointment time, as well as call back number.    Scot Jun, PT, DPT, OCS, ATC 12/30/22  2:06 PM

## 2022-12-31 ENCOUNTER — Emergency Department (HOSPITAL_COMMUNITY)
Admission: EM | Admit: 2022-12-31 | Discharge: 2023-01-01 | Disposition: A | Discharge status: Home / Self Care | Attending: Emergency Medicine | Admitting: Emergency Medicine

## 2022-12-31 ENCOUNTER — Other Ambulatory Visit: Payer: Self-pay

## 2022-12-31 ENCOUNTER — Emergency Department (HOSPITAL_COMMUNITY)

## 2022-12-31 ENCOUNTER — Encounter (HOSPITAL_COMMUNITY): Payer: Self-pay

## 2022-12-31 DIAGNOSIS — F3130 Bipolar disorder, current episode depressed, mild or moderate severity, unspecified: Secondary | ICD-10-CM | POA: Insufficient documentation

## 2022-12-31 DIAGNOSIS — E119 Type 2 diabetes mellitus without complications: Secondary | ICD-10-CM | POA: Insufficient documentation

## 2022-12-31 DIAGNOSIS — F1410 Cocaine abuse, uncomplicated: Secondary | ICD-10-CM | POA: Insufficient documentation

## 2022-12-31 DIAGNOSIS — R45851 Suicidal ideations: Secondary | ICD-10-CM | POA: Diagnosis not present

## 2022-12-31 DIAGNOSIS — I10 Essential (primary) hypertension: Secondary | ICD-10-CM | POA: Diagnosis not present

## 2022-12-31 DIAGNOSIS — Z20822 Contact with and (suspected) exposure to covid-19: Secondary | ICD-10-CM | POA: Diagnosis not present

## 2022-12-31 DIAGNOSIS — F3181 Bipolar II disorder: Secondary | ICD-10-CM | POA: Diagnosis not present

## 2022-12-31 DIAGNOSIS — D72829 Elevated white blood cell count, unspecified: Secondary | ICD-10-CM | POA: Insufficient documentation

## 2022-12-31 DIAGNOSIS — J45909 Unspecified asthma, uncomplicated: Secondary | ICD-10-CM | POA: Insufficient documentation

## 2022-12-31 DIAGNOSIS — F17210 Nicotine dependence, cigarettes, uncomplicated: Secondary | ICD-10-CM | POA: Diagnosis not present

## 2022-12-31 DIAGNOSIS — F1910 Other psychoactive substance abuse, uncomplicated: Secondary | ICD-10-CM | POA: Diagnosis not present

## 2022-12-31 LAB — RAPID URINE DRUG SCREEN, HOSP PERFORMED
Amphetamines: NOT DETECTED
Barbiturates: NOT DETECTED
Benzodiazepines: NOT DETECTED
Cocaine: POSITIVE — AB
Opiates: NOT DETECTED
Tetrahydrocannabinol: POSITIVE — AB

## 2022-12-31 LAB — CBC
HCT: 42.3 % (ref 36.0–46.0)
Hemoglobin: 13.4 g/dL (ref 12.0–15.0)
MCH: 26.6 pg (ref 26.0–34.0)
MCHC: 31.7 g/dL (ref 30.0–36.0)
MCV: 84.1 fL (ref 80.0–100.0)
Platelets: 331 10*3/uL (ref 150–400)
RBC: 5.03 MIL/uL (ref 3.87–5.11)
RDW: 15.9 % — ABNORMAL HIGH (ref 11.5–15.5)
WBC: 11 10*3/uL — ABNORMAL HIGH (ref 4.0–10.5)
nRBC: 0 % (ref 0.0–0.2)

## 2022-12-31 LAB — I-STAT BETA HCG BLOOD, ED (MC, WL, AP ONLY): I-stat hCG, quantitative: <5 m[IU]/mL (ref <5)

## 2022-12-31 LAB — COMPREHENSIVE METABOLIC PANEL
ALT: 17 U/L (ref 0–44)
AST: 24 U/L (ref 15–41)
Albumin: 3.8 g/dL (ref 3.5–5.0)
Alkaline Phosphatase: 71 U/L (ref 38–126)
Anion gap: 12 (ref 5–15)
BUN: 14 mg/dL (ref 6–20)
CO2: 23 mmol/L (ref 22–32)
Calcium: 9.7 mg/dL (ref 8.9–10.3)
Chloride: 102 mmol/L (ref 98–111)
Creatinine, Ser: 0.95 mg/dL (ref 0.44–1.00)
GFR, Estimated: >60 mL/min (ref >60)
Glucose, Bld: 95 mg/dL (ref 70–99)
Potassium: 4.2 mmol/L (ref 3.5–5.1)
Sodium: 137 mmol/L (ref 135–145)
Total Bilirubin: 1.1 mg/dL (ref 0.3–1.2)
Total Protein: 7.5 g/dL (ref 6.5–8.1)

## 2022-12-31 LAB — RESP PANEL BY RT-PCR (RSV, FLU A&B, COVID)  RVPGX2
Influenza A by PCR: NEGATIVE
Influenza B by PCR: NEGATIVE
Resp Syncytial Virus by PCR: NEGATIVE
SARS Coronavirus 2 by RT PCR: NEGATIVE

## 2022-12-31 MED ORDER — BENZONATATE 100 MG PO CAPS
100.0000 mg | ORAL_CAPSULE | Freq: Three times a day (TID) | ORAL | Status: DC | PRN
  Administered 2022-12-31: 100 mg via ORAL
  Filled 2022-12-31: qty 1

## 2022-12-31 MED ORDER — TRAZODONE HCL 50 MG PO TABS: 50.0000 mg | ORAL_TABLET | Freq: Every evening | ORAL | Status: DC | PRN

## 2022-12-31 MED ORDER — HYDROXYZINE HCL 25 MG PO TABS
25.0000 mg | ORAL_TABLET | Freq: Three times a day (TID) | ORAL | Status: DC | PRN
  Administered 2023-01-01: 25 mg via ORAL
  Filled 2022-12-31: qty 1

## 2022-12-31 MED ORDER — BENZONATATE 100 MG PO CAPS
100.0000 mg | ORAL_CAPSULE | Freq: Once | ORAL | Status: AC
  Administered 2022-12-31: 100 mg via ORAL
  Filled 2022-12-31: qty 1

## 2022-12-31 MED ORDER — LAMOTRIGINE 25 MG PO TABS
50.0000 mg | ORAL_TABLET | Freq: Every day | ORAL | Status: DC
  Administered 2022-12-31 – 2023-01-01 (×2): 50 mg via ORAL
  Filled 2022-12-31 (×2): qty 2

## 2022-12-31 NOTE — ED Triage Notes (Signed)
Pt arrived POV from home c/o suicidal thoughts with a plan to overdose on pills.

## 2022-12-31 NOTE — ED Notes (Signed)
Pt belongings placed in locker number 4 NON-inventoried

## 2022-12-31 NOTE — Progress Notes (Signed)
Pt was accepted [facility] 01/01/23; Bed Assignment [place] [facility]  Pt meets inpatient criteria per Vesta Mixer, NP  Attending Physician will be Dr. Brantley Fling  Report can be called to: [phone]  Pt can arrive after 12:00 [place] Team notified: [facility] Lynnda Shields, RN ,Vesta Mixer, NP, [place], [place], Gordan Payment, RN  [employer], [place] 12/31/2022 @ 4:43 PM

## 2022-12-31 NOTE — Progress Notes (Signed)
LCSW Progress Note  [id number]   Denise Knight  12/31/2022  4:10 PM  Description:   Inpatient Psychiatric Referral  Patient was recommended inpatient per Vesta Mixer, NP. There are no available beds at [facility]. Patient was referred to the following facilities:   Destination  Service Provider Address Phone Fax  [place] [place] [address] [phone]  [place], [place] [phone] [facility]  [place], [place] [address] [phone]  [facility]  [address] [place] [address] [phone]  [facility]  [place], [place] [address] [phone]  [facility]  [place] [place] [address] [place] [address] [place]  [facility]  [address] [place] [address] [phone]  [facility]  [address] [place] [address] [phone]  [place] [address], [place] [address] [id number]  [facility]  [address], [place] [address] [phone] [place]  [address] [place] [address] [phone] [facility]  [address], [place] [address] [phone] [facility]  [address] [place] [address] [place]  [address], [place] [address] [phone] [facility]  [address], [place] [address] [phone] [facility]  [address] [place] [address] [place]  [facility]  [address], [place] [address] [phone]  [facility]  [address] [place] [address] [phone]  [facility]  [address] [place] [address] [phone]  [facility]  [place], [place] [address] [place]  [facility]  [address], [place] [address] [phone]  [place]  [address] [place] [address] [phone]  [place]  [address] [place] [address] [place]  [facility]  [address] [place] [address] [phone]  [facility]  [address] [place] [address] [phone]  [place]  [address], [place] [address] [phone]  [place] [address] [place] [address] [phone]  [facility] [place] [address] [phone] [facility]  [place], [place] [address] [phone]  [facility]  [address], [place] [address] [phone] [facility]  [place], [place] [address] [phone]    Situation ongoing, CSW to continue following and update chart as more information becomes available.      [name], [place]  12/31/2022 4:10 PM

## 2022-12-31 NOTE — Consult Note (Signed)
[facility] ED ASSESSMENT   Reason for Consult:  SI Referring Physician:  Oren Binet, [place] Patient Identification: Denise Knight MRN:  [id number] ED Chief Complaint: Bipolar II disorder, most recent episode major depressive ([name])  Diagnosis:  Principal Problem:   Bipolar II disorder, most recent episode major depressive ([place]) Active Problems:   Polysubstance abuse ([place])   Suicidal ideations   ED Assessment Time Calculation: Start Time: 1530 Stop Time: 1615 Total Time in Minutes (Assessment Completion): 45   HPI:   Denise Knight is a 57 y.o. female patient with reported history of bipolar depression and PTSD who presents voluntarily to [facility] ED for suicidal ideations with a plan to overdose.  Subjective:   Patient seen at [facility] ED for face-to-face psychiatric evaluation.  She is sitting in her bed, calm and cooperative with assessment.  She tells me she has been off psychiatric medications for over a year, she does not remember the medication she previously took.  She has many stressors going on right now.  Her husband died 5 years ago and she is still struggling with this loss.  She does get a widows pension from the [place] due to his death and that is her financial means of living.  She currently lives with her daughter and reports unstable relationship with her.  She only lives there because she feels like she needs to help take care of her grandchildren.  She does believe they will be asked to leave the trailer soon, rent increased almost $2000.  The patient stated that would be most of her paycheck, and she heavily financially supports her daughter and her grandchildren.  Her daughter also recently lost her job and is unemployed.  She does report substance abuse.  Currently she is using crack cocaine and THC frequently throughout the week.  She occasionally drinks alcohol.  She is not sleeping well at night, but denies problems with appetite.  She is also wanting to become sober and get additional  resources for substance use treatment.  Omni stated around a year ago she had a suicide attempt where she overdosed and was in the hospital for over a week.  She stated she had to be revived 3 times.  She is having intrusive thoughts to overdose again.  She denies homicidal ideations.  Denies any auditory or visual hallucinations.  She is wanting to start psychotropic medications again and acquire outpatient follow-up. We did talk about treatment options, and patient is agreeable to inpatient psychiatric treatment. [facility] currently has no bed availability, CSW is faxing out to other facilities.   Past Psychiatric History:  See above  Risk to Self or Others: Is the patient at risk to self? Yes Has the patient been a risk to self in the past 6 months? No Has the patient been a risk to self within the distant past? Yes Is the patient a risk to others? No Has the patient been a risk to others in the past 6 months? No Has the patient been a risk to others within the distant past? No  [place] Scale:  [place] [facility] from 12/31/2022 in [facility] Emergency Department at [facility] ED from 06/26/2022 in [facility] Emergency Department at [facility] ED from 04/25/2022 in [facility] Emergency Department at [place] CATEGORY High Risk No Risk No Risk       AIMS:  , , ,  ,   ASAM:    Substance Abuse:  Past Medical History:  Past Medical History:  Diagnosis Date   Allergy    seasonal   Anxiety    Arthritis    "left knee" (07/05/2016)   Asthma    Bipolar 1 disorder ([place])    Depression    Diabetes mellitus without complication (HCC)    pre-   GERD (gastroesophageal reflux disease)    Hypertension    MI (mitral incompetence)    Post traumatic stress disorder    Schizophrenia ([place])    Stomach ulcer    Substance abuse ([place])    cocaine quit july 2020   Suicide attempt [facility])     Past Surgical History:  Procedure Laterality Date   CARDIAC  CATHETERIZATION  07/05/2016   CARDIAC CATHETERIZATION N/A 07/05/2016   Procedure: Left Heart Cath and Coronary Angiography;  Surgeon: Adrian Prows, MD;  Location: [facility];  Service: Cardiovascular;  Laterality: N/A;   CYST EXCISION Right    "wrist"   DILATION AND CURETTAGE OF UTERUS     FOOT SURGERY Bilateral    "corns removed"   LEFT HEART CATH AND CORONARY ANGIOGRAPHY N/A 12/24/2017   Procedure: LEFT HEART CATH AND CORONARY ANGIOGRAPHY;  Surgeon: Nigel Mormon, MD;  Location: [facility];  Service: Cardiovascular;  Laterality: N/A;   TUBAL LIGATION     Family History:  Family History  Problem Relation Age of Onset   Lung cancer Other    Congestive Heart Failure Other    Diabetes Other    Hypertension Other    Colon cancer Neg Hx    Colon polyps Neg Hx    Esophageal cancer Neg Hx    Stomach cancer Neg Hx    Rectal cancer Neg Hx    Social History:  Social History   Substance and Sexual Activity  Alcohol Use Yes   Alcohol/week: 7.0 standard drinks of alcohol   Types: 7 Cans of beer per week     Social History   Substance and Sexual Activity  Drug Use Yes   Types: Marijuana    Social History   Socioeconomic History   Marital status: Widowed    Spouse name: Not on file   Number of children: 2   Years of education: Not on file   Highest education level: Not on file  Occupational History   Occupation: disabled   Tobacco Use   Smoking status: Some Days    Packs/day: 0.25    Years: 35.00    Total pack years: 8.75    Types: Cigarettes    Last attempt to quit: 05/30/2019    Years since quitting: 3.5   Smokeless tobacco: Never  Vaping Use   Vaping Use: Never used  Substance and Sexual Activity   Alcohol use: Yes    Alcohol/week: 7.0 standard drinks of alcohol    Types: 7 Cans of beer per week   Drug use: Yes    Types: Marijuana   Sexual activity: Yes    Birth control/protection: Surgical  Other Topics Concern   Not on file  Social History  Narrative   Not on file   Social Determinants of Health   Financial Resource Strain: Not on file  Food Insecurity: Not on file  Transportation Needs: Not on file  Physical Activity: Not on file  Stress: Not on file  Social Connections: Not on file   Additional Social History:    Allergies:   Allergies  Allergen Reactions   Morphine And Related Hives  Aspirin Hives   Food Hives    "Regular butter"   Lactose Hives    Butter is the only dairy product that she has a reaction to   Other Hives    Regular butter 05/30/2017 -   Tilactase     05/30/2017 -    BUTTER    Labs:  Results for orders placed or performed during the hospital encounter of 12/31/22 (from the past 48 hour(s))  Comprehensive metabolic panel     Status: None   Collection Time: 12/31/22  9:16 AM  Result Value Ref Range   Sodium 137 135 - 145 mmol/L   Potassium 4.2 3.5 - 5.1 mmol/L   Chloride 102 98 - 111 mmol/L   CO2 23 22 - 32 mmol/L   Glucose, Bld 95 70 - 99 mg/dL    Comment: Glucose reference range applies only to samples taken after fasting for at least 8 hours.   BUN 14 6 - 20 mg/dL   Creatinine, Ser 0.95 0.44 - 1.00 mg/dL   Calcium 9.7 8.9 - 10.3 mg/dL   Total Protein 7.5 6.5 - 8.1 g/dL   Albumin 3.8 3.5 - 5.0 g/dL   AST 24 15 - 41 U/L   ALT 17 0 - 44 U/L   Alkaline Phosphatase 71 38 - 126 U/L   Total Bilirubin 1.1 0.3 - 1.2 mg/dL   GFR, Estimated >60 >60 mL/min    Comment: (NOTE) Calculated using the CKD-EPI Creatinine Equation (2021)    Anion gap 12 5 - 15    Comment: Performed at [facility] [address] [place], [place] [address]  cbc     Status: Abnormal   Collection Time: 12/31/22  9:16 AM  Result Value Ref Range   WBC 11.0 (H) 4.0 - 10.5 K/uL   RBC 5.03 3.87 - 5.11 MIL/uL   Hemoglobin 13.4 12.0 - 15.0 g/dL   HCT 42.3 36.0 - 46.0 %   MCV 84.1 80.0 - 100.0 fL   MCH 26.6 26.0 - 34.0 pg   MCHC 31.7 30.0 - 36.0 g/dL   RDW 15.9 (H) 11.5 - 15.5 %   Platelets 331 150 - 400  K/uL   nRBC 0.0 0.0 - 0.2 %    Comment: Performed at [facility], [place] [address] [place], [place] [address]  I-Stat beta hCG blood, ED     Status: None   Collection Time: 12/31/22  9:33 AM  Result Value Ref Range   I-stat hCG, quantitative <5.0 <5 mIU/mL   Comment 3            Comment:   GEST. AGE      CONC.  (mIU/mL)   <=1 WEEK        5 - 50     2 WEEKS       50 - 500     3 WEEKS       100 - 10,000     4 WEEKS     1,000 - 30,000        FEMALE AND NON-PREGNANT FEMALE:     LESS THAN 5 mIU/mL   Rapid urine drug screen (hospital performed)     Status: Abnormal   Collection Time: 12/31/22  9:54 AM  Result Value Ref Range   Opiates NONE DETECTED NONE DETECTED   Cocaine POSITIVE (A) NONE DETECTED   Benzodiazepines NONE DETECTED NONE DETECTED   Amphetamines NONE DETECTED NONE DETECTED   Tetrahydrocannabinol POSITIVE (A) NONE DETECTED  Barbiturates NONE DETECTED NONE DETECTED    Comment: (NOTE) DRUG SCREEN FOR MEDICAL PURPOSES ONLY.  IF CONFIRMATION IS NEEDED FOR ANY PURPOSE, NOTIFY LAB WITHIN 5 DAYS.  LOWEST DETECTABLE LIMITS FOR URINE DRUG SCREEN Drug Class                     Cutoff (ng/mL) Amphetamine and metabolites    1000 Barbiturate and metabolites    200 Benzodiazepine                 200 Opiates and metabolites        300 Cocaine and metabolites        300 THC                            50 Performed at [facility], [place] [address] [place], [place] [address]   Resp panel by RT-PCR (RSV, Flu A&B, Covid) Anterior Nasal Swab     Status: None   Collection Time: 12/31/22 10:41 AM   Specimen: Anterior Nasal Swab  Result Value Ref Range   SARS Coronavirus 2 by RT PCR NEGATIVE NEGATIVE   Influenza A by PCR NEGATIVE NEGATIVE   Influenza B by PCR NEGATIVE NEGATIVE    Comment: (NOTE) The Xpert Xpress SARS-CoV-2/FLU/RSV plus assay is intended as an aid in the diagnosis of influenza from Nasopharyngeal swab specimens and should not be used as a sole basis for  treatment. Nasal washings and aspirates are unacceptable for Xpert Xpress SARS-CoV-2/FLU/RSV testing.  Fact Sheet for Patients: EntrepreneurPulse.com.au  Fact Sheet for Healthcare Providers: IncredibleEmployment.be  This test is not yet approved or cleared by the [place] FDA and has been authorized for detection and/or diagnosis of SARS-CoV-2 by FDA under an Emergency Use Authorization (EUA). This EUA will remain in effect (meaning this test can be used) for the duration of the COVID-19 declaration under Section 564(b)(1) of the Act, 21 U.S.C. section 360bbb-3(b)(1), unless the authorization is terminated or revoked.     Resp Syncytial Virus by PCR NEGATIVE NEGATIVE    Comment: (NOTE) Fact Sheet for Patients: EntrepreneurPulse.com.au  Fact Sheet for Healthcare Providers: IncredibleEmployment.be  This test is not yet approved or cleared by the [place] FDA and has been authorized for detection and/or diagnosis of SARS-CoV-2 by FDA under an Emergency Use Authorization (EUA). This EUA will remain in effect (meaning this test can be used) for the duration of the COVID-19 declaration under Section 564(b)(1) of the Act, 21 U.S.C. section 360bbb-3(b)(1), unless the authorization is terminated or revoked.  Performed at [facility], [place] [address] [place], [place] [address]     Current Facility-Administered Medications  Medication Dose Route Frequency Provider Last Rate Last Admin   hydrOXYzine (ATARAX) tablet 25 mg  25 mg Oral TID PRN Vesta Mixer, NP       lamoTRIgine (LAMICTAL) tablet 50 mg  50 mg Oral Daily Vesta Mixer, NP       traZODone (DESYREL) tablet 50 mg  50 mg Oral QHS PRN Vesta Mixer, NP       Current Outpatient Medications  Medication Sig Dispense Refill   albuterol (VENTOLIN HFA) 108 (90 Base) MCG/ACT inhaler Inhale 1-2 puffs into the lungs every 6 (six) hours  as needed for wheezing or shortness of breath. (Patient not taking: Reported on 01/18/2022) 1 each 0   amLODipine (NORVASC) 10 MG tablet Take 10 mg by mouth every morning. (Patient not taking:  Reported on 01/18/2022)     bisacodyl (DULCOLAX) 5 MG EC tablet Take 2 tablets (10 mg total) by mouth daily as needed for moderate constipation. (Patient not taking: Reported on 01/14/2022) 30 tablet 0   fluticasone (FLOVENT HFA) 110 MCG/ACT inhaler Inhale 1 puff into the lungs 2 (two) times daily. (Patient not taking: Reported on [id number]) 1 each 0   folic acid (FOLVITE) 1 MG tablet Take 1 tablet (1 mg total) by mouth daily. (Patient not taking: Reported on 01/14/2022) 30 tablet 0   gabapentin (NEURONTIN) 100 MG capsule Take 1 capsule (100 mg total) by mouth 3 (three) times daily. (Patient not taking: Reported on 01/18/2022) 90 capsule 0   guaiFENesin (ROBITUSSIN) 100 MG/5ML liquid Take 10 mLs by mouth every 4 (four) hours as needed for to loosen phlegm. (Patient not taking: Reported on 01/14/2022) 120 mL 0   hydrOXYzine (ATARAX/VISTARIL) 25 MG tablet Take 1 tablet (25 mg total) by mouth 3 (three) times daily as needed for anxiety. (Patient not taking: Reported on 01/14/2022) 30 tablet 0   lamoTRIgine (LAMICTAL) 25 MG tablet Take 1 tablet (25 mg total) by mouth daily. (Patient not taking: Reported on 01/14/2022) 30 tablet 0   loratadine (CLARITIN) 10 MG tablet Take 1 tablet (10 mg total) by mouth daily. (Patient not taking: Reported on 01/18/2022) 30 tablet 0   Melatonin 10 MG TABS Take 10 mg by mouth at bedtime. (Patient not taking: Reported on 01/18/2022) 30 tablet 0   mirtazapine (REMERON) 15 MG tablet Take 1 tablet (15 mg total) by mouth at bedtime. (Patient not taking: Reported on 01/14/2022) 30 tablet 0   Multiple Vitamin (MULTIVITAMIN WITH MINERALS) TABS tablet Take 1 tablet by mouth daily. (Patient not taking: Reported on 01/14/2022) 30 tablet    mupirocin ointment (BACTROBAN) 2 % Place into the nose 2 (two) times daily.  Apply in Nose 2 times daily until 09/27/21. (Patient not taking: Reported on 01/14/2022) 22 g 0   nicotine polacrilex (NICORETTE) 2 MG gum Take 1 each (2 mg total) by mouth as needed for smoking cessation. (Patient not taking: Reported on 01/14/2022) 100 tablet 0   pantoprazole (PROTONIX) 40 MG tablet Take 1 tablet (40 mg total) by mouth every morning. (Patient not taking: Reported on 01/18/2022) 30 tablet 0   Pseudoephedrine-DM-GG (SUDAFED COUGH PO) Take 1 capsule by mouth daily as needed (Allergies). (Patient not taking: Reported on 01/18/2022)     QUEtiapine Fumarate 150 MG TABS Take 150 mg by mouth at bedtime. (Patient not taking: Reported on 01/14/2022) 30 tablet 0   simvastatin (ZOCOR) 40 MG tablet Take 1 tablet (40 mg total) by mouth daily with supper. (Patient not taking: Reported on 01/14/2022) 30 tablet 0   sodium chloride (OCEAN) 0.65 % SOLN nasal spray Place 1 spray into both nostrils as needed for congestion. (Patient not taking: Reported on 01/14/2022) 15 mL 0   thiamine 100 MG tablet Take 1 tablet (100 mg total) by mouth daily. (Patient not taking: Reported on 01/14/2022) 30 tablet 0   vitamin B-12 (CYANOCOBALAMIN) 1000 MCG tablet Take 1 tablet (1,000 mcg total) by mouth daily. (Patient not taking: Reported on 01/14/2022) 30 tablet 0   Vitamin D, Ergocalciferol, (DRISDOL) 1.25 MG (50000 UNIT) CAPS capsule Take 1 capsule (50,000 Units total) by mouth every 7 (seven) days. (Patient not taking: Reported on 01/14/2022) 5 capsule 0   Psychiatric Specialty Exam: Presentation  General Appearance:  Appropriate for Environment  Eye Contact: Good  Speech: Clear and Coherent  Speech  Volume: Normal  Handedness: Right   Mood and Affect  Mood: Depressed; Hopeless  Affect: Congruent; Tearful   Thought Process  Thought Processes: Coherent  Descriptions of Associations:Intact  Orientation:Full (Time, Place and Person)  Thought Content:Logical  History of Schizophrenia/Schizoaffective  disorder:Yes  Duration of Psychotic Symptoms:No data recorded Hallucinations:Hallucinations: None  Ideas of Reference:None  Suicidal Thoughts:Suicidal Thoughts: No  Homicidal Thoughts:Homicidal Thoughts: No   Sensorium  Memory: Immediate Fair; Recent Fair  Judgment: Fair  Insight: Good   Executive Functions  Concentration: Good  Attention Span: Good  Recall: Good  Fund of Knowledge: Good  Language: Good   Psychomotor Activity  Psychomotor Activity: Psychomotor Activity: Normal   Assets  Assets: Desire for Improvement; Leisure Time; Physical Health; Resilience; Social Support    Sleep  Sleep: Sleep: Fair   Physical Exam: Physical Exam Neurological:     Mental Status: She is alert and oriented to person, place, and time.  Psychiatric:        Attention and Perception: Attention normal.        Mood and Affect: Mood is depressed. Affect is tearful.        Speech: Speech normal.        Behavior: Behavior is cooperative.        Thought Content: Thought content includes suicidal ideation. Thought content includes suicidal plan.    Review of Systems  Psychiatric/Behavioral:  Positive for depression, substance abuse and suicidal ideas. The patient has insomnia.   All other systems reviewed and are negative.  Blood pressure (!) 146/103, pulse 98, temperature 98.6 F (37 C), temperature source Oral, resp. rate 20, height 5' 9"$  (1.753 m), weight 93.4 kg, SpO2 99 %. Body mass index is 30.42 kg/m.  Medical Decision Making: Pt case reviewed and discussed with Dr. Leverne Humbles. Will recommend inpatient psychiatric treatment at this time. [facility] has no current bed availability, CSW notified to fax out.   - Start Lamotrigine 50 mg daily  Disposition: Recommend psychiatric Inpatient admission when medically cleared.  Vesta Mixer, NP 12/31/2022 4:03 PM

## 2022-12-31 NOTE — ED Provider Notes (Signed)
[facility] Provider Note   CSN: [id number] Arrival date & time: 12/31/22  [id number]     History  Chief Complaint  Patient presents with   Suicidal    Denise Knight is a 57 y.o. female who presents emergency department with concerns for SI onset prior to.  Patient notes that she has a plan to overdose on pills.  Denies HI, auditory/visual hallucinations.  Denies chest pain, abdominal pain.  The history is provided by the patient. No language interpreter was used.       Home Medications Prior to Admission medications   Medication Sig Start Date End Date Taking? Authorizing Provider  albuterol (VENTOLIN HFA) 108 (90 Base) MCG/ACT inhaler Inhale 1-2 puffs into the lungs every 6 (six) hours as needed for wheezing or shortness of breath. Patient not taking: Reported on 01/18/2022 09/25/21   Armando Reichert, MD  amLODipine (NORVASC) 10 MG tablet Take 10 mg by mouth every morning. Patient not taking: Reported on 01/18/2022 05/03/21   [provider]  bisacodyl (DULCOLAX) 5 MG EC tablet Take 2 tablets (10 mg total) by mouth daily as needed for moderate constipation. Patient not taking: Reported on 01/14/2022 09/25/21   Armando Reichert, MD  fluticasone (FLOVENT HFA) 110 MCG/ACT inhaler Inhale 1 puff into the lungs 2 (two) times daily. Patient not taking: Reported on 01/18/2022 09/25/21   Armando Reichert, MD  folic acid (FOLVITE) 1 MG tablet Take 1 tablet (1 mg total) by mouth daily. Patient not taking: Reported on 01/14/2022 09/25/21   Armando Reichert, MD  gabapentin (NEURONTIN) 100 MG capsule Take 1 capsule (100 mg total) by mouth 3 (three) times daily. Patient not taking: Reported on 01/18/2022 09/25/21   Armando Reichert, MD  guaiFENesin (ROBITUSSIN) 100 MG/5ML liquid Take 10 mLs by mouth every 4 (four) hours as needed for to loosen phlegm. Patient not taking: Reported on 01/14/2022 09/25/21   Armando Reichert, MD  hydrOXYzine (ATARAX/VISTARIL) 25 MG tablet  Take 1 tablet (25 mg total) by mouth 3 (three) times daily as needed for anxiety. Patient not taking: Reported on 01/14/2022 09/25/21   Armando Reichert, MD  lamoTRIgine (LAMICTAL) 25 MG tablet Take 1 tablet (25 mg total) by mouth daily. Patient not taking: Reported on 01/14/2022 09/25/21 10/25/21  Armando Reichert, MD  loratadine (CLARITIN) 10 MG tablet Take 1 tablet (10 mg total) by mouth daily. Patient not taking: Reported on 01/18/2022 09/25/21   Armando Reichert, MD  Melatonin 10 MG TABS Take 10 mg by mouth at bedtime. Patient not taking: Reported on 01/18/2022 09/25/21   Armando Reichert, MD  mirtazapine (REMERON) 15 MG tablet Take 1 tablet (15 mg total) by mouth at bedtime. Patient not taking: Reported on 01/14/2022 09/25/21 10/25/21  Armando Reichert, MD  Multiple Vitamin (MULTIVITAMIN WITH MINERALS) TABS tablet Take 1 tablet by mouth daily. Patient not taking: Reported on 01/14/2022 09/25/21   Armando Reichert, MD  mupirocin ointment (BACTROBAN) 2 % Place into the nose 2 (two) times daily. Apply in Nose 2 times daily until 09/27/21. Patient not taking: Reported on 01/14/2022 09/27/21   Armando Reichert, MD  nicotine polacrilex (NICORETTE) 2 MG gum Take 1 each (2 mg total) by mouth as needed for smoking cessation. Patient not taking: Reported on 01/14/2022 09/25/21   Armando Reichert, MD  pantoprazole (PROTONIX) 40 MG tablet Take 1 tablet (40 mg total) by mouth every morning. Patient not taking: Reported on 01/18/2022 09/25/21   Armando Reichert, MD  Pseudoephedrine-DM-GG (SUDAFED COUGH PO) Take  1 capsule by mouth daily as needed (Allergies). Patient not taking: Reported on 01/18/2022    [provider]  QUEtiapine Fumarate 150 MG TABS Take 150 mg by mouth at bedtime. Patient not taking: Reported on 01/14/2022 09/25/21   Armando Reichert, MD  simvastatin (ZOCOR) 40 MG tablet Take 1 tablet (40 mg total) by mouth daily with supper. Patient not taking: Reported on 01/14/2022 09/25/21   Armando Reichert, MD  sodium chloride (OCEAN)  0.65 % SOLN nasal spray Place 1 spray into both nostrils as needed for congestion. Patient not taking: Reported on 01/14/2022 09/25/21   Armando Reichert, MD  thiamine 100 MG tablet Take 1 tablet (100 mg total) by mouth daily. Patient not taking: Reported on 01/14/2022 09/25/21   Armando Reichert, MD  vitamin B-12 (CYANOCOBALAMIN) 1000 MCG tablet Take 1 tablet (1,000 mcg total) by mouth daily. Patient not taking: Reported on 01/14/2022 09/25/21   Armando Reichert, MD  Vitamin D, Ergocalciferol, (DRISDOL) 1.25 MG (50000 UNIT) CAPS capsule Take 1 capsule (50,000 Units total) by mouth every 7 (seven) days. Patient not taking: Reported on 01/14/2022 09/26/21   Armando Reichert, MD  amantadine (SYMMETREL) 100 MG capsule Take 1 capsule (100 mg total) by mouth 2 (two) times daily. 01/17/20 10/13/20  Connye Burkitt, NP      Allergies    Morphine and related, Aspirin, Food, Lactose, Other, and Tilactase    Review of Systems   Review of Systems  All other systems reviewed and are negative.   Physical Exam Updated Vital Signs BP (!) 146/103   Pulse 98   Temp 98.6 F (37 C) (Oral)   Resp 20   Ht 5' 9"$  (1.753 m)   Wt 93.4 kg   SpO2 99%   BMI 30.42 kg/m  Physical Exam Vitals and nursing note reviewed.  Constitutional:      General: She is not in acute distress.    Appearance: She is not diaphoretic.  HENT:     Head: Normocephalic and atraumatic.     Mouth/Throat:     Pharynx: No oropharyngeal exudate.  Eyes:     General: No scleral icterus.    Conjunctiva/sclera: Conjunctivae normal.  Cardiovascular:     Rate and Rhythm: Normal rate and regular rhythm.     Pulses: Normal pulses.     Heart sounds: Normal heart sounds.  Pulmonary:     Effort: Pulmonary effort is normal. No respiratory distress.     Breath sounds: Normal breath sounds. No wheezing.  Abdominal:     General: Bowel sounds are normal.     Palpations: Abdomen is soft. There is no mass.     Tenderness: There is no abdominal tenderness. There  is no guarding or rebound.  Musculoskeletal:        General: Normal range of motion.     Cervical back: Normal range of motion and neck supple.  Skin:    General: Skin is warm and dry.  Neurological:     Mental Status: She is alert.  Psychiatric:        Behavior: Behavior normal.     ED Results / Procedures / Treatments   Labs (all labs ordered are listed, but only abnormal results are displayed) Labs Reviewed  CBC - Abnormal; Notable for the following components:      Result Value   WBC 11.0 (*)    RDW 15.9 (*)    All other components within normal limits  RAPID URINE DRUG SCREEN, HOSP PERFORMED - Abnormal;  Notable for the following components:   Cocaine POSITIVE (*)    Tetrahydrocannabinol POSITIVE (*)    All other components within normal limits  RESP PANEL BY RT-PCR (RSV, FLU A&B, COVID)  RVPGX2  COMPREHENSIVE METABOLIC PANEL  ETHANOL  SALICYLATE LEVEL  ACETAMINOPHEN LEVEL  I-STAT BETA HCG BLOOD, ED ([facility], [facility], [facility] ONLY)    EKG EKG Interpretation  Date/Time:  Tuesday December 31 2022 08:59:59 EST Ventricular Rate:  93 PR Interval:  152 QRS Duration: 74 QT Interval:  372 QTC Calculation: 462 R Axis:   26 Text Interpretation: Normal sinus rhythm Normal ECG Confirmed by Margaretmary Eddy [phone]) on 12/31/2022 9:47:54 AM  Radiology DG Chest 2 View  Result Date: 12/31/2022 CLINICAL DATA:  cough, shortness of breath EXAM: CHEST - 2 VIEW COMPARISON:  Chest x-ray April 25, 2022. FINDINGS: The heart size and mediastinal contours are within normal limits. Both lungs are clear. No visible pleural effusions or pneumothorax. No acute osseous abnormality. IMPRESSION: No active cardiopulmonary disease. Electronically Signed   By: Margaretha Sheffield M.D.   On: 12/31/2022 11:09    Procedures Procedures    Medications Ordered in ED Medications  benzonatate (TESSALON) capsule 100 mg (100 mg Oral Given 12/31/22 1226)    ED Course/ Medical Decision Making/ A&P                              Medical Decision Making Amount and/or Complexity of Data Reviewed Labs: ordered. Radiology: ordered.  Risk Prescription drug management.   Pt presents with concerns for SI. Pt with plan to overdose on pills. Vital signs, pt afebrile. On exam, pt with no acute cardiovascular, respiratory, abdominal exam findings. Differential diagnosis includes COVID, Flu, RSV, PNA.   Labs:  I ordered, and personally interpreted labs.  The pertinent results include:   acetaminophen, salicylate, ethanol ordered with results pending UDS notable positive for cocaine and THC CBC slightly elevated WBC at 11 CMP negative COVID, Flu, RSV negative  Imaging: I ordered imaging studies including CXR  I independently visualized and interpreted imaging which showed: no acute findings I agree with the radiologist interpretation  Medications:  I ordered medication including tessalon perles for symptom management Reevaluation of the patient after these medicines and interventions, I reevaluated the patient and found that they have improved I have reviewed the patients home medicines and have made adjustments as needed  Disposition: Presentation notable for psych evaluation. Labs without acute findings, patient medically cleared at this time. TTS consult placed. Dispo as per TTS team.    This chart was dictated using voice recognition software, Dragon. Despite the best efforts of this provider to proofread and correct errors, errors may still occur which can change documentation meaning.   Final Clinical Impression(s) / ED Diagnoses Final diagnoses:  Suicidal ideation    Rx / DC Orders ED Discharge Orders     None         Kegan Mckeithan A, PA-C 12/31/22 1530    Fransico Meadow, MD 12/31/22 1549

## 2022-12-31 NOTE — ED Notes (Signed)
Secure chat EDP [facility] for cough med

## 2022-12-31 NOTE — ED Notes (Signed)
Called [facility] for tray ETA 3pm

## 2023-01-01 ENCOUNTER — Encounter: Admitting: Rehabilitative and Restorative Service Providers"

## 2023-01-01 MED ORDER — HYDROCORTISONE 1 % EX CREA
TOPICAL_CREAM | Freq: Two times a day (BID) | CUTANEOUS | Status: DC
  Filled 2023-01-01: qty 28

## 2023-01-01 NOTE — ED Notes (Signed)
Pt given information and plan of care transport to [facility] reported she did not want to call Family. Pt aware [facility] does not call [facility] to report trensport.

## 2023-01-01 NOTE — ED Notes (Signed)
[facility] refused report . [facility] will not take report until transport is at bedside. Calling transport

## 2023-01-01 NOTE — ED Notes (Signed)
Voluntary consent for treatment signed by PT.

## 2023-01-01 NOTE — ED Provider Notes (Signed)
Emergency Medicine Observation Re-evaluation Note  Denise Knight is a 57 y.o. female, seen on rounds today.  Pt initially presented to the ED for complaints of Suicidal Currently, the patient is asleep.  Physical Exam  BP 123/86 (BP Location: Right Arm)   Pulse 91   Temp 98.1 F (36.7 C) (Oral)   Resp 17   Ht 5' 9"$  (1.753 m)   Wt 93.4 kg   SpO2 100%   BMI 30.42 kg/m  Physical Exam General: Calm, asleep resting in bed comfortably, no acute distress Cardiac: Regular rate Lungs: No increased work of breathing Psych: Calm, sleep  ED Course / MDM  EKG:EKG Interpretation  Date/Time:  Tuesday December 31 2022 08:59:59 EST Ventricular Rate:  93 PR Interval:  152 QRS Duration: 74 QT Interval:  372 QTC Calculation: 462 R Axis:   26 Text Interpretation: Normal sinus rhythm Normal ECG Confirmed by Margaretmary Eddy [phone]) on 12/31/2022 9:47:54 AM  I have reviewed the labs performed to date as well as medications administered while in observation.  Recent changes in the last 24 hours include accepted by Dr. Brantley Fling to inpatient psych facility at [facility] pending transport today.  Plan  Current plan is for inpatient psych.    Kemper Durie, DO 01/01/23 [phone]

## 2023-01-06 ENCOUNTER — Encounter: Admitting: Rehabilitative and Restorative Service Providers"

## 2023-01-08 ENCOUNTER — Encounter: Admitting: Rehabilitative and Restorative Service Providers"

## 2023-01-12 ENCOUNTER — Emergency Department (HOSPITAL_COMMUNITY)
Admission: EM | Admit: 2023-01-12 | Discharge: 2023-01-12 | Disposition: A | Discharge status: Other Acute Inpatient Hospital | Attending: Emergency Medicine | Admitting: Emergency Medicine

## 2023-01-12 ENCOUNTER — Other Ambulatory Visit: Payer: Self-pay

## 2023-01-12 ENCOUNTER — Emergency Department (HOSPITAL_COMMUNITY)

## 2023-01-12 ENCOUNTER — Other Ambulatory Visit (INDEPENDENT_AMBULATORY_CARE_PROVIDER_SITE_OTHER)
Admission: EM | Admit: 2023-01-12 | Discharge: 2023-01-16 | Disposition: A | Discharge status: Rehabilitation Facility | Source: Home / Self Care | Admitting: Psychiatry

## 2023-01-12 ENCOUNTER — Encounter (HOSPITAL_COMMUNITY): Payer: Self-pay

## 2023-01-12 DIAGNOSIS — R45851 Suicidal ideations: Secondary | ICD-10-CM | POA: Diagnosis not present

## 2023-01-12 DIAGNOSIS — Z79899 Other long term (current) drug therapy: Secondary | ICD-10-CM | POA: Insufficient documentation

## 2023-01-12 DIAGNOSIS — I10 Essential (primary) hypertension: Secondary | ICD-10-CM | POA: Diagnosis not present

## 2023-01-12 DIAGNOSIS — F419 Anxiety disorder, unspecified: Secondary | ICD-10-CM | POA: Diagnosis not present

## 2023-01-12 DIAGNOSIS — E119 Type 2 diabetes mellitus without complications: Secondary | ICD-10-CM | POA: Insufficient documentation

## 2023-01-12 DIAGNOSIS — F1414 Cocaine abuse with cocaine-induced mood disorder: Secondary | ICD-10-CM | POA: Diagnosis present

## 2023-01-12 DIAGNOSIS — Z7951 Long term (current) use of inhaled steroids: Secondary | ICD-10-CM | POA: Insufficient documentation

## 2023-01-12 DIAGNOSIS — K219 Gastro-esophageal reflux disease without esophagitis: Secondary | ICD-10-CM | POA: Diagnosis not present

## 2023-01-12 DIAGNOSIS — F17210 Nicotine dependence, cigarettes, uncomplicated: Secondary | ICD-10-CM | POA: Insufficient documentation

## 2023-01-12 DIAGNOSIS — F1494 Cocaine use, unspecified with cocaine-induced mood disorder: Secondary | ICD-10-CM | POA: Diagnosis present

## 2023-01-12 DIAGNOSIS — F1290 Cannabis use, unspecified, uncomplicated: Secondary | ICD-10-CM | POA: Insufficient documentation

## 2023-01-12 DIAGNOSIS — Z1152 Encounter for screening for COVID-19: Secondary | ICD-10-CM | POA: Diagnosis not present

## 2023-01-12 DIAGNOSIS — R0789 Other chest pain: Secondary | ICD-10-CM | POA: Insufficient documentation

## 2023-01-12 DIAGNOSIS — J45909 Unspecified asthma, uncomplicated: Secondary | ICD-10-CM | POA: Diagnosis not present

## 2023-01-12 DIAGNOSIS — F329 Major depressive disorder, single episode, unspecified: Secondary | ICD-10-CM | POA: Diagnosis present

## 2023-01-12 DIAGNOSIS — F1410 Cocaine abuse, uncomplicated: Secondary | ICD-10-CM | POA: Insufficient documentation

## 2023-01-12 DIAGNOSIS — F3181 Bipolar II disorder: Secondary | ICD-10-CM | POA: Diagnosis not present

## 2023-01-12 DIAGNOSIS — F332 Major depressive disorder, recurrent severe without psychotic features: Secondary | ICD-10-CM

## 2023-01-12 DIAGNOSIS — F1910 Other psychoactive substance abuse, uncomplicated: Secondary | ICD-10-CM | POA: Diagnosis present

## 2023-01-12 LAB — CBC WITH DIFFERENTIAL/PLATELET
Abs Immature Granulocytes: 0.03 10*3/uL (ref 0.00–0.07)
Basophils Absolute: 0.1 10*3/uL (ref 0.0–0.1)
Basophils Relative: 1 %
Eosinophils Absolute: 0.3 10*3/uL (ref 0.0–0.5)
Eosinophils Relative: 3 %
HCT: 43.1 % (ref 36.0–46.0)
Hemoglobin: 13.8 g/dL (ref 12.0–15.0)
Immature Granulocytes: 0 %
Lymphocytes Relative: 27 %
Lymphs Abs: 2.7 10*3/uL (ref 0.7–4.0)
MCH: 26.6 pg (ref 26.0–34.0)
MCHC: 32 g/dL (ref 30.0–36.0)
MCV: 83.2 fL (ref 80.0–100.0)
Monocytes Absolute: 0.7 10*3/uL (ref 0.1–1.0)
Monocytes Relative: 7 %
Neutro Abs: 6.2 10*3/uL (ref 1.7–7.7)
Neutrophils Relative %: 62 %
Platelets: 360 10*3/uL (ref 150–400)
RBC: 5.18 MIL/uL — ABNORMAL HIGH (ref 3.87–5.11)
RDW: 15.8 % — ABNORMAL HIGH (ref 11.5–15.5)
WBC: 9.9 10*3/uL (ref 4.0–10.5)
nRBC: 0 % (ref 0.0–0.2)

## 2023-01-12 LAB — BASIC METABOLIC PANEL
Anion gap: 11 (ref 5–15)
BUN: 14 mg/dL (ref 6–20)
CO2: 22 mmol/L (ref 22–32)
Calcium: 10 mg/dL (ref 8.9–10.3)
Chloride: 103 mmol/L (ref 98–111)
Creatinine, Ser: 1.23 mg/dL — ABNORMAL HIGH (ref 0.44–1.00)
GFR, Estimated: 51 mL/min — ABNORMAL LOW (ref >60)
Glucose, Bld: 103 mg/dL — ABNORMAL HIGH (ref 70–99)
Potassium: 4.2 mmol/L (ref 3.5–5.1)
Sodium: 136 mmol/L (ref 135–145)

## 2023-01-12 LAB — RESP PANEL BY RT-PCR (RSV, FLU A&B, COVID)  RVPGX2
Influenza A by PCR: NEGATIVE
Influenza B by PCR: NEGATIVE
Resp Syncytial Virus by PCR: NEGATIVE
SARS Coronavirus 2 by RT PCR: NEGATIVE

## 2023-01-12 LAB — RAPID URINE DRUG SCREEN, HOSP PERFORMED
Amphetamines: NOT DETECTED
Barbiturates: NOT DETECTED
Benzodiazepines: NOT DETECTED
Cocaine: POSITIVE — AB
Opiates: NOT DETECTED
Tetrahydrocannabinol: NOT DETECTED

## 2023-01-12 LAB — SALICYLATE LEVEL: Salicylate Lvl: <7 mg/dL — ABNORMAL LOW (ref 7.0–30.0)

## 2023-01-12 LAB — ETHANOL: Alcohol, Ethyl (B): <10 mg/dL (ref <10)

## 2023-01-12 LAB — TROPONIN I (HIGH SENSITIVITY)
Troponin I (High Sensitivity): 9 ng/L (ref <18)
Troponin I (High Sensitivity): 12 ng/L (ref <18)

## 2023-01-12 LAB — I-STAT BETA HCG BLOOD, ED (MC, WL, AP ONLY): I-stat hCG, quantitative: <5 m[IU]/mL (ref <5)

## 2023-01-12 LAB — MAGNESIUM: Magnesium: 2.1 mg/dL (ref 1.7–2.4)

## 2023-01-12 LAB — ACETAMINOPHEN LEVEL: Acetaminophen (Tylenol), Serum: <10 ug/mL — ABNORMAL LOW (ref 10–30)

## 2023-01-12 MED ORDER — METHOCARBAMOL 500 MG PO TABS
500.0000 mg | ORAL_TABLET | Freq: Four times a day (QID) | ORAL | Status: DC | PRN
  Administered 2023-01-13 – 2023-01-15 (×7): 500 mg via ORAL
  Filled 2023-01-12 (×7): qty 1

## 2023-01-12 MED ORDER — LORAZEPAM 1 MG PO TABS
0.0000 mg | ORAL_TABLET | Freq: Four times a day (QID) | ORAL | Status: DC
  Administered 2023-01-12: 1 mg via ORAL
  Filled 2023-01-12: qty 1

## 2023-01-12 MED ORDER — ONDANSETRON 4 MG PO TBDP: 4.0000 mg | ORAL_TABLET | Freq: Four times a day (QID) | ORAL | Status: AC | PRN

## 2023-01-12 MED ORDER — MAGNESIUM HYDROXIDE 400 MG/5ML PO SUSP: 30.0000 mL | Freq: Every day | ORAL | Status: DC | PRN

## 2023-01-12 MED ORDER — LORAZEPAM 2 MG/ML IJ SOLN: 0.0000 mg | Freq: Two times a day (BID) | INTRAMUSCULAR | Status: DC

## 2023-01-12 MED ORDER — NAPHAZOLINE-GLYCERIN 0.012-0.25 % OP SOLN
1.0000 [drp] | Freq: Two times a day (BID) | OPHTHALMIC | Status: DC | PRN
  Administered 2023-01-12: 2 [drp] via OPHTHALMIC
  Filled 2023-01-12: qty 15

## 2023-01-12 MED ORDER — THIAMINE MONONITRATE 100 MG PO TABS
100.0000 mg | ORAL_TABLET | Freq: Every day | ORAL | Status: DC
  Administered 2023-01-12: 100 mg via ORAL
  Filled 2023-01-12: qty 1

## 2023-01-12 MED ORDER — LURASIDONE HCL 60 MG PO TABS
60.0000 mg | ORAL_TABLET | Freq: Every day | ORAL | Status: DC
  Administered 2023-01-12 – 2023-01-16 (×5): 60 mg via ORAL
  Filled 2023-01-12 (×5): qty 1

## 2023-01-12 MED ORDER — LORAZEPAM 2 MG/ML IJ SOLN: 0.0000 mg | Freq: Four times a day (QID) | INTRAMUSCULAR | Status: DC

## 2023-01-12 MED ORDER — ACETAMINOPHEN 325 MG PO TABS
650.0000 mg | ORAL_TABLET | Freq: Four times a day (QID) | ORAL | Status: DC | PRN
  Administered 2023-01-12 – 2023-01-13 (×2): 650 mg via ORAL
  Filled 2023-01-12 (×2): qty 2

## 2023-01-12 MED ORDER — KETOROLAC TROMETHAMINE 15 MG/ML IJ SOLN
15.0000 mg | Freq: Once | INTRAMUSCULAR | Status: AC
  Administered 2023-01-12: 15 mg via INTRAMUSCULAR
  Filled 2023-01-12: qty 1

## 2023-01-12 MED ORDER — TRAZODONE HCL 50 MG PO TABS
50.0000 mg | ORAL_TABLET | Freq: Every evening | ORAL | Status: DC | PRN
  Filled 2023-01-12: qty 1

## 2023-01-12 MED ORDER — LOPERAMIDE HCL 2 MG PO CAPS: 2.0000 mg | ORAL_CAPSULE | ORAL | Status: AC | PRN

## 2023-01-12 MED ORDER — THIAMINE HCL 100 MG/ML IJ SOLN: 100.0000 mg | Freq: Every day | INTRAMUSCULAR | Status: DC

## 2023-01-12 MED ORDER — ALUM & MAG HYDROXIDE-SIMETH 200-200-20 MG/5ML PO SUSP
30.0000 mL | ORAL | Status: DC | PRN
  Administered 2023-01-14: 30 mL via ORAL
  Filled 2023-01-12: qty 30

## 2023-01-12 MED ORDER — LORAZEPAM 1 MG PO TABS
1.0000 mg | ORAL_TABLET | Freq: Three times a day (TID) | ORAL | Status: DC | PRN
  Administered 2023-01-12: 1 mg via ORAL
  Filled 2023-01-12: qty 1

## 2023-01-12 MED ORDER — ACETAMINOPHEN 325 MG PO TABS
650.0000 mg | ORAL_TABLET | Freq: Once | ORAL | Status: AC
  Administered 2023-01-12: 650 mg via ORAL
  Filled 2023-01-12: qty 2

## 2023-01-12 MED ORDER — HYDROXYZINE HCL 25 MG PO TABS
25.0000 mg | ORAL_TABLET | Freq: Four times a day (QID) | ORAL | Status: AC | PRN
  Administered 2023-01-13 – 2023-01-15 (×6): 25 mg via ORAL
  Filled 2023-01-12 (×7): qty 1

## 2023-01-12 MED ORDER — THIAMINE MONONITRATE 100 MG PO TABS
100.0000 mg | ORAL_TABLET | Freq: Every day | ORAL | Status: DC
  Administered 2023-01-13 – 2023-01-16 (×4): 100 mg via ORAL
  Filled 2023-01-12 (×4): qty 1

## 2023-01-12 MED ORDER — LORAZEPAM 1 MG PO TABS: 0.0000 mg | ORAL_TABLET | Freq: Two times a day (BID) | ORAL | Status: DC

## 2023-01-12 MED ORDER — LORAZEPAM 1 MG PO TABS: 1.0000 mg | ORAL_TABLET | Freq: Four times a day (QID) | ORAL | Status: AC | PRN

## 2023-01-12 MED ORDER — ADULT MULTIVITAMIN W/MINERALS CH
1.0000 | ORAL_TABLET | Freq: Every day | ORAL | Status: DC
  Administered 2023-01-13 – 2023-01-16 (×4): 1 via ORAL
  Filled 2023-01-12 (×4): qty 1

## 2023-01-12 NOTE — ED Notes (Signed)
[facility] called and enroute to [email or web address] 

## 2023-01-12 NOTE — ED Notes (Addendum)
Patient states that her head is hurting. 10/10. Notified provider . Patients blood pressure was within normal limits. 104/86 88 RR 18. Will continue to monitor. Denies any other symptoms.Tylenol was given .

## 2023-01-12 NOTE — ED Notes (Signed)
Patient A&Ox4. Patient denies SI/HI and AVH. Patient denies any physical complaints when asked. No acute distress noted. Support and encouragement provided. Routine safety checks conducted according to facility protocol. Encouraged patient to notify staff if thoughts of harm toward self or others arise. Patient verbalize understanding and agreement. Will continue to monitor for safety.

## 2023-01-12 NOTE — ED Notes (Signed)
Pt transferred from [facility] to [facility] requesting detox . Denies SI/HI/AVH. Calm, cooperative throughout interview process. Skin assessment completed. Oriented to unit. Meal and drink offered. At [place], pt continue to  denies SI/HI/AVH. Pt verbally contract for safety. Will monitor for safety. Patients belonging are in locker 3. She had a big brown bag put in locker by security,gray necklace and a pair of tennis shoes.

## 2023-01-12 NOTE — ED Notes (Signed)
Pt is laying down watching TV. Does not need anything at this time.

## 2023-01-12 NOTE — ED Notes (Signed)
Patient complains of right eye irritation and some redness noted. Patient denies itching. Quintella Reichert, NP informed. Respirations equal and unlabored. Skin warm and dry. No acute distress noted. Will continue to monitor.

## 2023-01-12 NOTE — ED Provider Notes (Signed)
Patient accepted to [facility] seen by Dr. Rosita Kea.  Reassessed patient she is resting comfortably in room and hemodynamically stable without any complaints.  She is aware of the plan and has no questions at this time.   Elgie Congo, MD 01/12/23 [phone]

## 2023-01-12 NOTE — ED Notes (Signed)
Provider states to give patient lurasidone 60 mg @ 1800 due to the patient not having it today.

## 2023-01-12 NOTE — ED Notes (Signed)
Patient states that her head ache is still 10/10 after tylenol notified provider.

## 2023-01-12 NOTE — ED Triage Notes (Addendum)
Pt brought in by EMS from home with complaints of reproducable L sided chest pain/N/V/headache/hot flashes since last night.   '4mg'$  IVP zofran given en route.   During triage pt also endorses SI, pt states she has a plan but did not disclose plan to this RN. Denies HI/AH/VH

## 2023-01-12 NOTE — Consult Note (Signed)
[facility] ED ASSESSMENT   Reason for Consult:  SI Referring Physician:  Deatra Canter, [place] Patient Identification: Denise Knight MRN:  [id number] ED Chief Complaint: Cocaine-induced mood disorder ([place])  Diagnosis:  Principal Problem:   Cocaine-induced mood disorder ([place]) Active Problems:   MDD (major depressive disorder)   ED Assessment Time Calculation: Start Time: 1300 Stop Time: 1345 Total Time in Minutes (Assessment Completion): 45   HPI:   Denise Knight is a 57 y.o. female patient with history of cocaine induced mood disorder, alcohol use disorder, MDD, and anxiety.  Pt originally presents with left-sided chest wall pain, and suicidal ideations with plan to overdose on pills.  Recently evaluated for the same on 2/20.  Patient was ultimately admitted to inpatient psych facility at [facility].  She states she was recently discharged on Friday.  Since then she has been using cocaine until last evening.  Chest pain started following the cocaine use.  Will evaluate with ACS labs, as well as psych medical clearance labs.   Vital signs are stable.  Chest pain is reproducible on exam.  CBC is unremarkable, BMP without acute concerns.  Initial troponin of 9.  Ethanol, salicylate, acetaminophen levels within normal limits.  Magnesium 2.1.  EKG without acute ischemic changes.  Chest x-ray without acute cardiopulmonary process.  No suspicion for ACS.  Likely MSK etiology of chest pain. Patient cleared from medical standpoint for psychiatric evaluation.  Subjective:   Patient seen at [facility] for face to face evaluation. She is calm, cooperative, willing to engage in assessment, and tearful. She explains to me she was released from [facility] inpatient on Friday, her friends picked her up, and she immediately started using cocaine, THC, and alcohol again. Pt stated last night while intoxicated/high she started having "flash backs" and realized how upset she was with herself that she relapsed. Pt began crying, had suicidal  thoughts, and presented to the hospital for help.   Today, patient continues to endorse passive SI. She denies any plans or intent. Pt stated "I think if I got my substance abuse under control, my mental health would be so much better." She denies HI. Denies AVH. She reported liking her medications at [facility], wishes to restart this med regimen again. She reports "okay" sleep, it is disturbed with her cocaine use. Reports okay appetite.   We spoke about treatment options, and I think patient would best benefit from [facility] admission to focus on substance abuse. She is also agreeable with this voluntary plan. She is able to contract for safety, and tells me she is motivated to start her sobriety journey. She is also hoping for residential substance abuse treatment. Will reach out to Dr. Rosita Kea for review for [facility] admission.   Past Psychiatric History:  See above  Risk to Self or Others: Is the patient at risk to self? No Has the patient been a risk to self in the past 6 months? No Has the patient been a risk to self within the distant past? No Is the patient a risk to others? No Has the patient been a risk to others in the past 6 months? No Has the patient been a risk to others within the distant past? No  [place] Scale:  [place] ED from 01/12/2023 in [facility] Emergency Department at [facility] ED from 12/31/2022 in [facility] Emergency Department at [facility] ED from 06/26/2022 in [facility] Emergency Department at [place] High Risk High Risk No  Risk       Past Medical History:  Past Medical History:  Diagnosis Date   Allergy    seasonal   Anxiety    Arthritis    "left knee" (07/05/2016)   Asthma    Bipolar 1 disorder ([place])    Depression    Diabetes mellitus without complication (HCC)    pre-   GERD (gastroesophageal reflux disease)    Hypertension    MI (mitral incompetence)    Post traumatic stress disorder    Schizophrenia  ([place])    Stomach ulcer    Substance abuse ([place])    cocaine quit july 2020   Suicide attempt [facility])     Past Surgical History:  Procedure Laterality Date   CARDIAC CATHETERIZATION  07/05/2016   CARDIAC CATHETERIZATION N/A 07/05/2016   Procedure: Left Heart Cath and Coronary Angiography;  Surgeon: Adrian Prows, MD;  Location: [place] [facility];  Service: Cardiovascular;  Laterality: N/A;   CYST EXCISION Right    "wrist"   DILATION AND CURETTAGE OF UTERUS     FOOT SURGERY Bilateral    "corns removed"   LEFT HEART CATH AND CORONARY ANGIOGRAPHY N/A 12/24/2017   Procedure: LEFT HEART CATH AND CORONARY ANGIOGRAPHY;  Surgeon: Nigel Mormon, MD;  Location: [place] [facility];  Service: Cardiovascular;  Laterality: N/A;   TUBAL LIGATION     Family History:  Family History  Problem Relation Age of Onset   Lung cancer Other    Congestive Heart Failure Other    Diabetes Other    Hypertension Other    Colon cancer Neg Hx    Colon polyps Neg Hx    Esophageal cancer Neg Hx    Stomach cancer Neg Hx    Rectal cancer Neg Hx    Social History:  Social History   Substance and Sexual Activity  Alcohol Use Yes   Alcohol/week: 7.0 standard drinks of alcohol   Types: 7 Cans of beer per week     Social History   Substance and Sexual Activity  Drug Use Yes   Types: Marijuana    Social History   Socioeconomic History   Marital status: Widowed    Spouse name: Not on file   Number of children: 2   Years of education: Not on file   Highest education level: Not on file  Occupational History   Occupation: disabled   Tobacco Use   Smoking status: Some Days    Packs/day: 0.25    Years: 35.00    Total pack years: 8.75    Types: Cigarettes    Last attempt to quit: 05/30/2019    Years since quitting: 3.6   Smokeless tobacco: Never  Vaping Use   Vaping Use: Never used  Substance and Sexual Activity   Alcohol use: Yes    Alcohol/week: 7.0 standard drinks of alcohol    Types: 7 Cans of  beer per week   Drug use: Yes    Types: Marijuana   Sexual activity: Yes    Birth control/protection: Surgical  Other Topics Concern   Not on file  Social History Narrative   Not on file   Social Determinants of Health   Financial Resource Strain: Not on file  Food Insecurity: Not on file  Transportation Needs: Not on file  Physical Activity: Not on file  Stress: Not on file  Social Connections: Not on file    Allergies:   Allergies  Allergen Reactions   Morphine And Related Hives  Aspirin Hives   Food Hives    "Regular butter"   Lactose Hives    Butter is the only dairy product that she has a reaction to   Other Hives    Regular butter 05/30/2017 -   Tilactase     05/30/2017 -    BUTTER    Labs:  Results for orders placed or performed during the hospital encounter of 01/12/23 (from the past 48 hour(s))  Basic metabolic panel     Status: Abnormal   Collection Time: 01/12/23 10:06 AM  Result Value Ref Range   Sodium 136 135 - 145 mmol/L   Potassium 4.2 3.5 - 5.1 mmol/L   Chloride 103 98 - 111 mmol/L   CO2 22 22 - 32 mmol/L   Glucose, Bld 103 (H) 70 - 99 mg/dL    Comment: Glucose reference range applies only to samples taken after fasting for at least 8 hours.   BUN 14 6 - 20 mg/dL   Creatinine, Ser 1.23 (H) 0.44 - 1.00 mg/dL   Calcium 10.0 8.9 - 10.3 mg/dL   GFR, Estimated 51 (L) >60 mL/min    Comment: (NOTE) Calculated using the CKD-EPI Creatinine Equation (2021)    Anion gap 11 5 - 15    Comment: Performed at [facility] [address], [place], [place] [address]  CBC with Differential/Platelet     Status: Abnormal   Collection Time: 01/12/23 10:06 AM  Result Value Ref Range   WBC 9.9 4.0 - 10.5 K/uL   RBC 5.18 (H) 3.87 - 5.11 MIL/uL   Hemoglobin 13.8 12.0 - 15.0 g/dL   HCT 43.1 36.0 - 46.0 %   MCV 83.2 80.0 - 100.0 fL   MCH 26.6 26.0 - 34.0 pg   MCHC 32.0 30.0 - 36.0 g/dL   RDW 15.8 (H) 11.5 - 15.5 %   Platelets 360 150 - 400 K/uL   nRBC  0.0 0.0 - 0.2 %   Neutrophils Relative % 62 %   Neutro Abs 6.2 1.7 - 7.7 K/uL   Lymphocytes Relative 27 %   Lymphs Abs 2.7 0.7 - 4.0 K/uL   Monocytes Relative 7 %   Monocytes Absolute 0.7 0.1 - 1.0 K/uL   Eosinophils Relative 3 %   Eosinophils Absolute 0.3 0.0 - 0.5 K/uL   Basophils Relative 1 %   Basophils Absolute 0.1 0.0 - 0.1 K/uL   Immature Granulocytes 0 %   Abs Immature Granulocytes 0.03 0.00 - 0.07 K/uL    Comment: Performed at [facility] [address] [place], [place] [address]  Magnesium     Status: None   Collection Time: 01/12/23 10:06 AM  Result Value Ref Range   Magnesium 2.1 1.7 - 2.4 mg/dL    Comment: Performed at [facility] [address] [place], [place] [address]  Troponin I (High Sensitivity)     Status: None   Collection Time: 01/12/23 10:06 AM  Result Value Ref Range   Troponin I (High Sensitivity) 9 <18 ng/L    Comment: (NOTE) Elevated high sensitivity troponin I (hsTnI) values and significant  changes across serial measurements may suggest ACS but many other  chronic and acute conditions are known to elevate hsTnI results.  Refer to the "Links" section for chest pain algorithms and additional  guidance. Performed at [name], [place] [address], [place], [place] [address]   Acetaminophen level     Status: Abnormal   Collection Time: 01/12/23  10:06 AM  Result Value Ref Range   Acetaminophen (Tylenol), Serum <10 (L) 10 - 30 ug/mL    Comment: (NOTE) Therapeutic concentrations vary significantly. A range of 10-30 ug/mL  may be an effective concentration for many patients. However, some  are best treated at concentrations outside of this range. Acetaminophen concentrations >150 ug/mL at 4 hours after ingestion  and >50 ug/mL at 12 hours after ingestion are often associated with  toxic reactions.  Performed at [facility], [place] [address], [place], [place] [id number]   Salicylate level     Status: Abnormal   Collection  Time: 01/12/23 10:06 AM  Result Value Ref Range   Salicylate Lvl Q000111Q (L) 7.0 - 30.0 mg/dL    Comment: Performed at [facility] [address], [place], [place] [address]  Ethanol     Status: None   Collection Time: 01/12/23 10:06 AM  Result Value Ref Range   Alcohol, Ethyl (B) <10 <10 mg/dL    Comment: (NOTE) Lowest detectable limit for serum alcohol is 10 mg/dL.  For medical purposes only. Performed at [facility], [place] [address], [place], [place] [address]   I-Stat beta hCG blood, ED     Status: None   Collection Time: 01/12/23 12:46 PM  Result Value Ref Range   I-stat hCG, quantitative <5.0 <5 mIU/mL   Comment 3            Comment:   GEST. AGE      CONC.  (mIU/mL)   <=1 WEEK        5 - 50     2 WEEKS       50 - 500     3 WEEKS       100 - 10,000     4 WEEKS     1,000 - 30,000        FEMALE AND NON-PREGNANT FEMALE:     LESS THAN 5 mIU/mL     Current Facility-Administered Medications  Medication Dose Route Frequency Provider Last Rate Last Admin   LORazepam (ATIVAN) injection 0-4 mg  0-4 mg Intravenous Q6H Ali, Amjad, PA-C       Or   LORazepam (ATIVAN) tablet 0-4 mg  0-4 mg Oral Q6H Ali, Amjad, PA-C   1 mg at 01/12/23 1253   [START ON 01/14/2023] LORazepam (ATIVAN) injection 0-4 mg  0-4 mg Intravenous Q12H Ali, Amjad, PA-C       Or   [START ON 01/14/2023] LORazepam (ATIVAN) tablet 0-4 mg  0-4 mg Oral Q12H Ali, Amjad, PA-C       thiamine (VITAMIN B1) tablet 100 mg  100 mg Oral Daily Deatra Canter, Amjad, PA-C   100 mg at 01/12/23 1252   Or   thiamine (VITAMIN B1) injection 100 mg  100 mg Intravenous Daily Evlyn Courier, PA-C       Current Outpatient Medications  Medication Sig Dispense Refill   albuterol (VENTOLIN HFA) 108 (90 Base) MCG/ACT inhaler Inhale 1-2 puffs into the lungs every 6 (six) hours as needed for wheezing or shortness of breath. 1 each 0   hydrOXYzine (VISTARIL) 50 MG capsule Take 100 mg by mouth 2 (two) times daily.     LORazepam (ATIVAN) 1 MG tablet Take  1 mg by mouth 4 (four) times daily as needed for anxiety.     Melatonin 10 MG TABS Take 10 mg by mouth at bedtime. 30 tablet 0   methocarbamol (ROBAXIN) 500 MG tablet Take 1,000  mg by mouth 2 (two) times daily.     PARoxetine (PAXIL) 20 MG tablet Take 20 mg by mouth daily.     prazosin (MINIPRESS) 1 MG capsule Take 1 mg by mouth 2 (two) times daily.     amLODipine (NORVASC) 10 MG tablet Take 10 mg by mouth every morning. (Patient not taking: Reported on 01/18/2022)     bisacodyl (DULCOLAX) 5 MG EC tablet Take 2 tablets (10 mg total) by mouth daily as needed for moderate constipation. (Patient not taking: Reported on 01/14/2022) 30 tablet 0   fluticasone (FLOVENT HFA) 110 MCG/ACT inhaler Inhale 1 puff into the lungs 2 (two) times daily. (Patient not taking: Reported on [id number]) 1 each 0   folic acid (FOLVITE) 1 MG tablet Take 1 tablet (1 mg total) by mouth daily. (Patient not taking: Reported on 01/14/2022) 30 tablet 0   gabapentin (NEURONTIN) 100 MG capsule Take 1 capsule (100 mg total) by mouth 3 (three) times daily. (Patient not taking: Reported on 01/18/2022) 90 capsule 0   guaiFENesin (ROBITUSSIN) 100 MG/5ML liquid Take 10 mLs by mouth every 4 (four) hours as needed for to loosen phlegm. (Patient not taking: Reported on 01/14/2022) 120 mL 0   hydrOXYzine (ATARAX/VISTARIL) 25 MG tablet Take 1 tablet (25 mg total) by mouth 3 (three) times daily as needed for anxiety. (Patient not taking: Reported on 01/14/2022) 30 tablet 0   lamoTRIgine (LAMICTAL) 25 MG tablet Take 1 tablet (25 mg total) by mouth daily. (Patient not taking: Reported on 01/14/2022) 30 tablet 0   loratadine (CLARITIN) 10 MG tablet Take 1 tablet (10 mg total) by mouth daily. (Patient not taking: Reported on 01/18/2022) 30 tablet 0   mirtazapine (REMERON) 15 MG tablet Take 1 tablet (15 mg total) by mouth at bedtime. (Patient not taking: Reported on 01/14/2022) 30 tablet 0   Multiple Vitamin (MULTIVITAMIN WITH MINERALS) TABS tablet Take 1 tablet by  mouth daily. (Patient not taking: Reported on 01/14/2022) 30 tablet    mupirocin ointment (BACTROBAN) 2 % Place into the nose 2 (two) times daily. Apply in Nose 2 times daily until 09/27/21. (Patient not taking: Reported on 01/14/2022) 22 g 0   nicotine polacrilex (NICORETTE) 2 MG gum Take 1 each (2 mg total) by mouth as needed for smoking cessation. (Patient not taking: Reported on 01/14/2022) 100 tablet 0   pantoprazole (PROTONIX) 40 MG tablet Take 1 tablet (40 mg total) by mouth every morning. (Patient not taking: Reported on 01/18/2022) 30 tablet 0   Pseudoephedrine-DM-GG (SUDAFED COUGH PO) Take 1 capsule by mouth daily as needed (Allergies). (Patient not taking: Reported on 01/18/2022)     QUEtiapine Fumarate 150 MG TABS Take 150 mg by mouth at bedtime. (Patient not taking: Reported on 01/14/2022) 30 tablet 0   simvastatin (ZOCOR) 40 MG tablet Take 1 tablet (40 mg total) by mouth daily with supper. (Patient not taking: Reported on 01/14/2022) 30 tablet 0   sodium chloride (OCEAN) 0.65 % SOLN nasal spray Place 1 spray into both nostrils as needed for congestion. (Patient not taking: Reported on 01/14/2022) 15 mL 0   thiamine 100 MG tablet Take 1 tablet (100 mg total) by mouth daily. (Patient not taking: Reported on 01/14/2022) 30 tablet 0   vitamin B-12 (CYANOCOBALAMIN) 1000 MCG tablet Take 1 tablet (1,000 mcg total) by mouth daily. (Patient not taking: Reported on 01/14/2022) 30 tablet 0   Vitamin D, Ergocalciferol, (DRISDOL) 1.25 MG (50000 UNIT) CAPS capsule Take 1 capsule (50,000 Units total) by mouth  every 7 (seven) days. (Patient not taking: Reported on 01/14/2022) 5 capsule 0    Psychiatric Specialty Exam: Presentation  General Appearance:  Appropriate for Environment  Eye Contact: Good  Speech: Clear and Coherent  Speech Volume: Normal  Handedness: Right   Mood and Affect  Mood: Depressed; Hopeless  Affect: Congruent   Thought Process  Thought Processes: Coherent  Descriptions of  Associations:Intact  Orientation:Full (Time, Place and Person)  Thought Content:WDL  History of Schizophrenia/Schizoaffective disorder:Yes  Duration of Psychotic Symptoms:No data recorded Hallucinations:Hallucinations: None  Ideas of Reference:None  Suicidal Thoughts:Suicidal Thoughts: Yes, Passive SI Passive Intent and/or Plan: Without Intent; Without Plan  Homicidal Thoughts:Homicidal Thoughts: No   Sensorium  Memory: Recent Good; Immediate Good  Judgment: Fair  Insight: Fair   Executive Functions  Concentration: Good  Attention Span: Good  Recall: Good  Fund of Knowledge: Good  Language: Good   Psychomotor Activity  Psychomotor Activity: Psychomotor Activity: Normal   Assets  Assets: Desire for Improvement; Leisure Time; Physical Health; Resilience    Sleep  Sleep: Sleep: Fair   Physical Exam: Physical Exam Neurological:     Mental Status: She is alert and oriented to person, place, and time.  Psychiatric:        Attention and Perception: Attention normal.        Mood and Affect: Mood is depressed. Affect is flat.        Speech: Speech normal.        Behavior: Behavior is cooperative.        Thought Content: Thought content includes suicidal ideation.    Review of Systems  Psychiatric/Behavioral:  Positive for depression, substance abuse and suicidal ideas.   All other systems reviewed and are negative.  Blood pressure 139/82, pulse 89, temperature 98.1 F (36.7 C), temperature source Oral, resp. rate 20, height '5\' 9"'$  (1.753 m), weight 96.2 kg, SpO2 96 %. Body mass index is 31.31 kg/m.  Medical Decision Making: Pt case reviewed and discussed with Dr. Leverne Humbles. Will have patient reviewed for [facility] admission. Will keep ED staff updated on dispo.   - continue current outpatient medications  Disposition:  being reviewed for [facility] admission  Vesta Mixer, NP 01/12/2023 1:20 PM

## 2023-01-12 NOTE — ED Provider Notes (Signed)
[facility] Provider Note   CSN: [id number] Arrival date & time: 01/12/23  [id number]     History  Chief Complaint  Patient presents with   Chest Pain   Suicidal    Denise Knight is a 57 y.o. female.  57 year old female with past medical history significant for suicidal ideation, polysubstance abuse presents today for evaluation of chest pain, and suicidal ideation.  Endorses cocaine use since being discharged on Friday up until last night.  Pain started after cocaine use.  Also complains of mild headache right now.  States her plan is to overdose on pills.  Denies HI, or AVH.  The history is provided by the patient. No language interpreter was used.       Home Medications Prior to Admission medications   Medication Sig Start Date End Date Taking? Authorizing Provider  albuterol (VENTOLIN HFA) 108 (90 Base) MCG/ACT inhaler Inhale 1-2 puffs into the lungs every 6 (six) hours as needed for wheezing or shortness of breath. Patient not taking: Reported on 01/18/2022 09/25/21   Armando Reichert, MD  amLODipine (NORVASC) 10 MG tablet Take 10 mg by mouth every morning. Patient not taking: Reported on 01/18/2022 05/03/21   [provider]  bisacodyl (DULCOLAX) 5 MG EC tablet Take 2 tablets (10 mg total) by mouth daily as needed for moderate constipation. Patient not taking: Reported on 01/14/2022 09/25/21   Armando Reichert, MD  fluticasone (FLOVENT HFA) 110 MCG/ACT inhaler Inhale 1 puff into the lungs 2 (two) times daily. Patient not taking: Reported on 01/18/2022 09/25/21   Armando Reichert, MD  folic acid (FOLVITE) 1 MG tablet Take 1 tablet (1 mg total) by mouth daily. Patient not taking: Reported on 01/14/2022 09/25/21   Armando Reichert, MD  gabapentin (NEURONTIN) 100 MG capsule Take 1 capsule (100 mg total) by mouth 3 (three) times daily. Patient not taking: Reported on 01/18/2022 09/25/21   Armando Reichert, MD  guaiFENesin (ROBITUSSIN) 100 MG/5ML liquid  Take 10 mLs by mouth every 4 (four) hours as needed for to loosen phlegm. Patient not taking: Reported on 01/14/2022 09/25/21   Armando Reichert, MD  hydrOXYzine (ATARAX/VISTARIL) 25 MG tablet Take 1 tablet (25 mg total) by mouth 3 (three) times daily as needed for anxiety. Patient not taking: Reported on 01/14/2022 09/25/21   Armando Reichert, MD  lamoTRIgine (LAMICTAL) 25 MG tablet Take 1 tablet (25 mg total) by mouth daily. Patient not taking: Reported on 01/14/2022 09/25/21 10/25/21  Armando Reichert, MD  loratadine (CLARITIN) 10 MG tablet Take 1 tablet (10 mg total) by mouth daily. Patient not taking: Reported on 01/18/2022 09/25/21   Armando Reichert, MD  Melatonin 10 MG TABS Take 10 mg by mouth at bedtime. Patient not taking: Reported on 01/18/2022 09/25/21   Armando Reichert, MD  mirtazapine (REMERON) 15 MG tablet Take 1 tablet (15 mg total) by mouth at bedtime. Patient not taking: Reported on 01/14/2022 09/25/21 10/25/21  Armando Reichert, MD  Multiple Vitamin (MULTIVITAMIN WITH MINERALS) TABS tablet Take 1 tablet by mouth daily. Patient not taking: Reported on 01/14/2022 09/25/21   Armando Reichert, MD  mupirocin ointment (BACTROBAN) 2 % Place into the nose 2 (two) times daily. Apply in Nose 2 times daily until 09/27/21. Patient not taking: Reported on 01/14/2022 09/27/21   Armando Reichert, MD  nicotine polacrilex (NICORETTE) 2 MG gum Take 1 each (2 mg total) by mouth as needed for smoking cessation. Patient not taking: Reported on 01/14/2022 09/25/21   Armando Reichert, MD  pantoprazole (PROTONIX) 40 MG tablet Take 1 tablet (40 mg total) by mouth every morning. Patient not taking: Reported on 01/18/2022 09/25/21   Armando Reichert, MD  Pseudoephedrine-DM-GG (SUDAFED COUGH PO) Take 1 capsule by mouth daily as needed (Allergies). Patient not taking: Reported on 01/18/2022    [provider]  QUEtiapine Fumarate 150 MG TABS Take 150 mg by mouth at bedtime. Patient not taking: Reported on 01/14/2022 09/25/21   Armando Reichert,  MD  simvastatin (ZOCOR) 40 MG tablet Take 1 tablet (40 mg total) by mouth daily with supper. Patient not taking: Reported on 01/14/2022 09/25/21   Armando Reichert, MD  sodium chloride (OCEAN) 0.65 % SOLN nasal spray Place 1 spray into both nostrils as needed for congestion. Patient not taking: Reported on 01/14/2022 09/25/21   Armando Reichert, MD  thiamine 100 MG tablet Take 1 tablet (100 mg total) by mouth daily. Patient not taking: Reported on 01/14/2022 09/25/21   Armando Reichert, MD  vitamin B-12 (CYANOCOBALAMIN) 1000 MCG tablet Take 1 tablet (1,000 mcg total) by mouth daily. Patient not taking: Reported on 01/14/2022 09/25/21   Armando Reichert, MD  Vitamin D, Ergocalciferol, (DRISDOL) 1.25 MG (50000 UNIT) CAPS capsule Take 1 capsule (50,000 Units total) by mouth every 7 (seven) days. Patient not taking: Reported on 01/14/2022 09/26/21   Armando Reichert, MD  amantadine (SYMMETREL) 100 MG capsule Take 1 capsule (100 mg total) by mouth 2 (two) times daily. 01/17/20 10/13/20  Connye Burkitt, NP      Allergies    Morphine and related, Aspirin, Food, Lactose, Other, and Tilactase    Review of Systems   Review of Systems  Constitutional:  Negative for chills and fever.  Respiratory:  Negative for shortness of breath.   Cardiovascular:  Positive for chest pain. Negative for palpitations and leg swelling.  Neurological:  Negative for speech difficulty and light-headedness.  Psychiatric/Behavioral:  Positive for suicidal ideas.   All other systems reviewed and are negative.   Physical Exam Updated Vital Signs BP (!) 125/97   Pulse 87   Temp 98.1 F (36.7 C) (Oral)   Resp 20   Ht '5\' 9"'$  (1.753 m)   Wt 96.2 kg   SpO2 100%   BMI 31.31 kg/m  Physical Exam Vitals and nursing note reviewed.  Constitutional:      General: She is not in acute distress.    Appearance: Normal appearance. She is not ill-appearing.  HENT:     Head: Normocephalic and atraumatic.     Nose: Nose normal.  Eyes:     General: No  scleral icterus.    Extraocular Movements: Extraocular movements intact.     Conjunctiva/sclera: Conjunctivae normal.  Cardiovascular:     Rate and Rhythm: Normal rate and regular rhythm.     Pulses: Normal pulses.  Pulmonary:     Effort: Pulmonary effort is normal. No respiratory distress.     Breath sounds: Normal breath sounds. No wheezing or rales.  Abdominal:     General: There is no distension.     Palpations: Abdomen is soft.     Tenderness: There is no abdominal tenderness. There is no guarding.  Musculoskeletal:        General: Normal range of motion.     Cervical back: Normal range of motion.  Skin:    General: Skin is warm and dry.  Neurological:     General: No focal deficit present.     Mental Status: She is alert. Mental status is at baseline.  Psychiatric:        Thought Content: Thought content includes suicidal ideation. Thought content does not include homicidal ideation. Thought content includes suicidal plan.     ED Results / Procedures / Treatments   Labs (all labs ordered are listed, but only abnormal results are displayed) Labs Reviewed  BASIC METABOLIC PANEL  CBC WITH DIFFERENTIAL/PLATELET  MAGNESIUM  ACETAMINOPHEN LEVEL  SALICYLATE LEVEL  RAPID URINE DRUG SCREEN, HOSP PERFORMED  ETHANOL  TROPONIN I (HIGH SENSITIVITY)    EKG EKG Interpretation  Date/Time:  Sunday January 12 2023 09:28:41 EST Ventricular Rate:  88 PR Interval:  156 QRS Duration: 75 QT Interval:  397 QTC Calculation: 481 R Axis:   37 Text Interpretation: Sinus rhythm Nonspecific ST abnormality No significant change since last tracing Confirmed by Lajean Saver [phone]) on 01/12/2023 9:44:32 AM  Radiology No results found.  Procedures Procedures    Medications Ordered in ED Medications  acetaminophen (TYLENOL) tablet 650 mg (650 mg Oral Given 01/12/23 1009)    ED Course/ Medical Decision Making/ A&P                             Medical Decision Making Amount and/or  Complexity of Data Reviewed Labs: ordered. Radiology: ordered.  Risk OTC drugs. Prescription drug management.   57 year old female presents with left-sided chest wall pain, and suicidal ideations with plan to overdose on pills.  Recently evaluated for the same on 2/20.  Patient was ultimately admitted to inpatient psych facility at [facility].  She states she was recently discharged on Friday.  Since then she has been using cocaine until last evening.  Chest pain started following the cocaine use.  Will evaluate with ACS labs, as well as psych medical clearance labs.  Vital signs are stable.  Chest pain is reproducible on exam.  CBC is unremarkable, BMP without acute concerns.  Initial troponin of 9.  Ethanol, salicylate, acetaminophen levels within normal limits.  Magnesium 2.1.  EKG without acute ischemic changes.  Chest x-ray without acute cardiopulmonary process.  No suspicion for ACS.  Likely MSK etiology of chest pain. Patient cleared from medical standpoint for psychiatric evaluation.  Complains of headache.  Toradol ordered.  Endorses history of alcohol use.  States she however does not drink daily.  Last drink was last evening.  CIWA protocol ordered.  Final Clinical Impression(s) / ED Diagnoses Final diagnoses:  Suicidal ideation  Chest wall pain    Rx / DC Orders ED Discharge Orders     None         Evlyn Courier, PA-C 01/12/23 1242    Lajean Saver, MD 01/16/23 1505

## 2023-01-12 NOTE — ED Provider Notes (Addendum)
Facility Based Crisis Admission H&P  Date: 01/12/23 Patient Name: Denise Knight MRN: [id number] Chief Complaint: transfer from [facility] requesting alcohol detox, and residential substance abuse treatment.   Diagnoses:  Final diagnoses:  Polysubstance abuse ([place])  Severe recurrent major depression without psychotic features (HCC)    HPI: Denise Knight 57 year old female patient who originally presented to the [facility] ED with left-sided chest wall pain and SI. In addition she has been using alcohol and cocaine for the past 2 days.  She was recommended for admission to the facility base crisis unit.  Denise Knight, 57 y.o., female patient seen face to face by this provider and chart reviewed on 01/12/23.  Per chart review patient presented to the [facility] ED on 12/31/2022 and was recommended for inpatient psychiatric admission.  She was accepted to [facility] and was discharged this past Friday 01/10/2023.  Since that time she has been using cocaine and alcohol.  She was prescribed upon discharge Latuda 60 mg daily with meals and methocarbamol 500 mg 4 times daily as needed and lorazepam 1 mg every 6 hours as needed.  She has a past psychiatric history of cocaine induced mood disorder, alcohol use disorder, MDD, and anxiety.  Upon admission patient is positive for cocaine and EtOH is negative.  She reports her last cocaine use was last night and her last alcohol intake was this morning before presenting to the ED.  She denies any past history of alcohol withdrawal seizures or delirium tremors.  She is denying any withdrawal symptoms at this time.  During evaluation Denise Knight is observed sitting in no acute distress.  She is alert/oriented x 4, calm, cooperative, and attentive.  She is slightly withdrawn when answering questions.  Her speech is clear, coherent, at a normal rate and tone.  She is casually dressed and makes good eye contact.  She continues to endorse depression and has a depressed affect.   She endorses passive SI with no intent or plan.  She verbally contracts for safety.  She denies HI/AVH.  She does not appear to be responding to internal/external stimuli.  She is able to answer questions appropriately.   Patient is agreeable to admission to the [facility].  She appears motivated for treatment.  She is interested in long-term residential treatment.  Contacted patient's pharmacy [facility]: Verified that patient is currently prescribed Latuda 60 mg daily: Lorazepam 1 mg every 6 hours as needed and methocarbamol 500 mg 4 times daily as needed.  Last prescription was sent to pharmacy on 01/10/2023.  Patient has not picked up medications.  She was discharged from [facility] on Friday 01/10/2023.  PHQ 2-9:   Flowsheet Row ED from 01/12/2023 in [facility] Emergency Department at [facility] ED from 12/31/2022 in [facility] Emergency Department at [facility] ED from 06/26/2022 in [facility] Emergency Department at [place] CATEGORY High Risk High Risk No Risk        Total Time spent with patient: 30 minutes  Musculoskeletal  Strength & Muscle Tone: within normal limits Gait & Station: normal Patient leans: N/A  Psychiatric Specialty Exam  Presentation General Appearance:  Appropriate for Environment; Casual  Eye Contact: Good  Speech: Clear and Coherent; Normal Rate  Speech Volume: Normal  Handedness: Right   Mood and Affect  Mood: Depressed; Anxious  Affect: Congruent   Thought Process  Thought Processes: Coherent  Descriptions of Associations:Intact  Orientation:Full (Time, Place and Person)  Thought Content:Logical  Diagnosis  of Schizophrenia or Schizoaffective disorder in past: Yes   Hallucinations:Hallucinations: None  Ideas of Reference:None  Suicidal Thoughts:Suicidal Thoughts: Yes, Passive SI Passive Intent and/or Plan: Without Intent; Without Plan; Without Access to Means  Homicidal Thoughts:Homicidal  Thoughts: No   Sensorium  Memory: Immediate Good; Recent Good; Remote Good  Judgment: Fair  Insight: Fair   Community education officer  Concentration: Good  Attention Span: Good  Recall: Good  Fund of Knowledge: Good  Language: Good   Psychomotor Activity  Psychomotor Activity: Psychomotor Activity: Normal   Assets  Assets: Communication Skills; Desire for Improvement; Financial Resources/Insurance; Resilience; Social Support; Talents/Skills   Sleep  Sleep: Sleep: Fair   Nutritional Assessment (For OBS and FBC admissions only) Has the patient had a weight loss or gain of 10 pounds or more in the last 3 months?: No Has the patient had a decrease in food intake/or appetite?: No Does the patient have eating habits or behaviors that may be indicators of an eating disorder including binging or inducing vomiting?: No Has the patient recently lost weight without trying?: 0 Has the patient been eating poorly because of a decreased appetite?: 0 Malnutrition Screening Tool Score: 0    Physical Exam Vitals and nursing note reviewed.  Constitutional:      General: She is not in acute distress.    Appearance: Normal appearance. She is not ill-appearing.  HENT:     Head: Normocephalic.  Eyes:     General:        Right eye: No discharge.        Left eye: No discharge.     Conjunctiva/sclera: Conjunctivae normal.  Cardiovascular:     Rate and Rhythm: Normal rate.  Pulmonary:     Effort: Pulmonary effort is normal.  Musculoskeletal:        General: Normal range of motion.     Cervical back: Normal range of motion.  Skin:    Coloration: Skin is not jaundiced or pale.  Neurological:     Mental Status: She is alert and oriented to person, place, and time.  Psychiatric:        Attention and Perception: Attention and perception normal.        Mood and Affect: Affect normal. Mood is depressed.        Speech: Speech normal.        Behavior: Behavior is withdrawn.         Thought Content: Thought content includes suicidal ideation. Thought content does not include suicidal plan.        Cognition and Memory: Cognition normal.        Judgment: Judgment normal.    Review of Systems  Constitutional: Negative.   HENT: Negative.    Eyes: Negative.   Respiratory: Negative.    Cardiovascular: Negative.   Musculoskeletal: Negative.   Skin: Negative.   Neurological: Negative.   Psychiatric/Behavioral:  Positive for depression, substance abuse and suicidal ideas.     Blood pressure 104/86, pulse 88, temperature 98.6 F (37 C), temperature source Oral, resp. rate 18, SpO2 98 %. There is no height or weight on file to calculate BMI.  Past Psychiatric History: Polysubstance abuse (cocaine and alcohol), MDD, and anxiety.  Is the patient at risk to self? No  Has the patient been a risk to self in the past 6 months? No .    Has the patient been a risk to self within the distant past? No   Is the patient a risk to others? No  Has the patient been a risk to others in the past 6 months? No   Has the patient been a risk to others within the distant past? No   Past Medical History:    Diagnosis Date   Allergy    seasonal   Anxiety    Arthritis    "left knee" (07/05/2016)   Asthma    Bipolar 1 disorder ([place])    Depression    Diabetes mellitus without complication (HCC)    pre-   GERD (gastroesophageal reflux disease)    Hypertension    MI (mitral incompetence)    Post traumatic stress disorder    Schizophrenia ([place])    Stomach ulcer    Substance abuse ([place])    cocaine quit july 2020   Suicide attempt ([place])     Family History: unknown   Social History:   Substance and Sexual Activity  Alcohol Use Yes   Alcohol/week: 7.0 standard drinks of alcohol   Types: 7 Cans of beer per week     Social History        Substance and Sexual Activity  Drug Use Yes   Types: Marijuana    Social History         Socioeconomic History   Marital status:  Widowed      Spouse name: Not on file   Number of children: 2   Years of education: Not on file   Highest education level: Not on file  Occupational History   Occupation: disabled   Tobacco Use   Smoking status: Some Days      Packs/day: 0.25      Years: 35.00      Total pack years: 8.75      Types: Cigarettes      Last attempt to quit: 05/30/2019      Years since quitting: 3.6   Smokeless tobacco: Never  Vaping Use   Vaping Use: Never used  Substance and Sexual Activity   Alcohol use: Yes      Alcohol/week: 7.0 standard drinks of alcohol      Types: 7 Cans of beer per week   Drug use: Yes      Types: Marijuana   Sexual activity: Yes     Last Labs:  Admission on 01/12/2023, Discharged on 01/12/2023  Component Date Value Ref Range Status   Sodium 01/12/2023 136  135 - 145 mmol/L Final   Potassium 01/12/2023 4.2  3.5 - 5.1 mmol/L Final   Chloride 01/12/2023 103  98 - 111 mmol/L Final   CO2 01/12/2023 22  22 - 32 mmol/L Final   Glucose, Bld 01/12/2023 103 (H)  70 - 99 mg/dL Final   Glucose reference range applies only to samples taken after fasting for at least 8 hours.   BUN 01/12/2023 14  6 - 20 mg/dL Final   Creatinine, Ser 01/12/2023 1.23 (H)  0.44 - 1.00 mg/dL Final   Calcium 01/12/2023 10.0  8.9 - 10.3 mg/dL Final   GFR, Estimated 01/12/2023 51 (L)  >60 mL/min Final   Comment: (NOTE) Calculated using the CKD-EPI Creatinine Equation (2021)    Anion gap 01/12/2023 11  5 - 15 Final   Performed at [facility] [address] [place], [place] [address]   WBC 01/12/2023 9.9  4.0 - 10.5 K/uL Final   RBC 01/12/2023 5.18 (H)  3.87 - 5.11 MIL/uL Final   Hemoglobin 01/12/2023 13.8  12.0 - 15.0 g/dL Final   HCT 01/12/2023 43.1  36.0 - 46.0 % Final   MCV 01/12/2023 83.2  80.0 - 100.0 fL Final   MCH 01/12/2023 26.6  26.0 - 34.0 pg Final   MCHC 01/12/2023 32.0  30.0 - 36.0 g/dL Final   RDW 01/12/2023 15.8 (H)  11.5 - 15.5 % Final   Platelets 01/12/2023 360  150 - 400  K/uL Final   nRBC 01/12/2023 0.0  0.0 - 0.2 % Final   Neutrophils Relative % 01/12/2023 62  % Final   Neutro Abs 01/12/2023 6.2  1.7 - 7.7 K/uL Final   Lymphocytes Relative 01/12/2023 27  % Final   Lymphs Abs 01/12/2023 2.7  0.7 - 4.0 K/uL Final   Monocytes Relative 01/12/2023 7  % Final   Monocytes Absolute 01/12/2023 0.7  0.1 - 1.0 K/uL Final   Eosinophils Relative 01/12/2023 3  % Final   Eosinophils Absolute 01/12/2023 0.3  0.0 - 0.5 K/uL Final   Basophils Relative 01/12/2023 1  % Final   Basophils Absolute 01/12/2023 0.1  0.0 - 0.1 K/uL Final   Immature Granulocytes 01/12/2023 0  % Final   Abs Immature Granulocytes 01/12/2023 0.03  0.00 - 0.07 K/uL Final   Performed at [facility], [place] [address], [place], [place] [address]   Magnesium 01/12/2023 2.1  1.7 - 2.4 mg/dL Final   Performed at [facility] [address], [place], [place] [address]   Troponin I (High Sensitivity) 01/12/2023 9  <18 ng/L Final   Comment: (NOTE) Elevated high sensitivity troponin I (hsTnI) values and significant  changes across serial measurements may suggest ACS but many other  chronic and acute conditions are known to elevate hsTnI results.  Refer to the "Links" section for chest pain algorithms and additional  guidance. Performed at [facility], [place] [address] [place], [place] [address]    Acetaminophen (Tylenol), Serum 01/12/2023 <10 (L)  10 - 30 ug/mL Final   Comment: (NOTE) Therapeutic concentrations vary significantly. A range of 10-30 ug/mL  may be an effective concentration for many patients. However, some  are best treated at concentrations outside of this range. Acetaminophen concentrations >150 ug/mL at 4 hours after ingestion  and >50 ug/mL at 12 hours after ingestion are often associated with  toxic reactions.  Performed at [facility], [place] [address], [place], [place] [id number]    Salicylate Lvl [id number] <7.0 (L)  7.0 - 30.0 mg/dL Final   Performed at  [facility] [address], [place], [place] [address]   Opiates 01/12/2023 NONE DETECTED  NONE DETECTED Final   Cocaine 01/12/2023 POSITIVE (A)  NONE DETECTED Final   Benzodiazepines 01/12/2023 NONE DETECTED  NONE DETECTED Final   Amphetamines 01/12/2023 NONE DETECTED  NONE DETECTED Final   Tetrahydrocannabinol 01/12/2023 NONE DETECTED  NONE DETECTED Final   Barbiturates 01/12/2023 NONE DETECTED  NONE DETECTED Final   Comment: (NOTE) DRUG SCREEN FOR MEDICAL PURPOSES ONLY.  IF CONFIRMATION IS NEEDED FOR ANY PURPOSE, NOTIFY LAB WITHIN 5 DAYS.  LOWEST DETECTABLE LIMITS FOR URINE DRUG SCREEN Drug Class                     Cutoff (ng/mL) Amphetamine and metabolites    1000 Barbiturate and metabolites    200 Benzodiazepine                 200 Opiates and metabolites        300 Cocaine and metabolites  300 THC                            50 Performed at [facility], [place] [address] [place], [place] [address]    Alcohol, Ethyl (B) 01/12/2023 <10  <10 mg/dL Final   Comment: (NOTE) Lowest detectable limit for serum alcohol is 10 mg/dL.  For medical purposes only. Performed at [facility], [place] [address] [place], [place] [address]    Troponin I (High Sensitivity) 01/12/2023 12  <18 ng/L Final   Comment: (NOTE) Elevated high sensitivity troponin I (hsTnI) values and significant  changes across serial measurements may suggest ACS but many other  chronic and acute conditions are known to elevate hsTnI results.  Refer to the "Links" section for chest pain algorithms and additional  guidance. Performed at [facility], [place] [address] [place], [place] [address]    I-stat hCG, quantitative 01/12/2023 <5.0  <5 mIU/mL Final   Comment 3 01/12/2023          Final   Comment:   GEST. AGE      CONC.  (mIU/mL)   <=1 WEEK        5 - 50     2 WEEKS       50 - 500     3 WEEKS       100 - 10,000     4 WEEKS     1,000 - 30,000        FEMALE AND NON-PREGNANT  FEMALE:     LESS THAN 5 mIU/mL    SARS Coronavirus 2 by RT PCR 01/12/2023 NEGATIVE  NEGATIVE Final   Influenza A by PCR 01/12/2023 NEGATIVE  NEGATIVE Final   Influenza B by PCR 01/12/2023 NEGATIVE  NEGATIVE Final   Comment: (NOTE) The Xpert Xpress SARS-CoV-2/FLU/RSV plus assay is intended as an aid in the diagnosis of influenza from Nasopharyngeal swab specimens and should not be used as a sole basis for treatment. Nasal washings and aspirates are unacceptable for Xpert Xpress SARS-CoV-2/FLU/RSV testing.  Fact Sheet for Patients: EntrepreneurPulse.com.au  Fact Sheet for Healthcare Providers: IncredibleEmployment.be  This test is not yet approved or cleared by the [place] FDA and has been authorized for detection and/or diagnosis of SARS-CoV-2 by FDA under an Emergency Use Authorization (EUA). This EUA will remain in effect (meaning this test can be used) for the duration of the COVID-19 declaration under Section 564(b)(1) of the Act, 21 U.S.C. section 360bbb-3(b)(1), unless the authorization is terminated or revoked.     Resp Syncytial Virus by PCR 01/12/2023 NEGATIVE  NEGATIVE Final   Comment: (NOTE) Fact Sheet for Patients: EntrepreneurPulse.com.au  Fact Sheet for Healthcare Providers: IncredibleEmployment.be  This test is not yet approved or cleared by the [place] FDA and has been authorized for detection and/or diagnosis of SARS-CoV-2 by FDA under an Emergency Use Authorization (EUA). This EUA will remain in effect (meaning this test can be used) for the duration of the COVID-19 declaration under Section 564(b)(1) of the Act, 21 U.S.C. section 360bbb-3(b)(1), unless the authorization is terminated or revoked.  Performed at [facility], [place] [address], [place], [place] [address]   Admission on 12/31/2022, Discharged on 01/01/2023  Component Date Value Ref Range Status   Sodium  12/31/2022 137  135 - 145 mmol/L Final   Potassium 12/31/2022 4.2  3.5 - 5.1 mmol/L Final   Chloride 12/31/2022 102  98 -  111 mmol/L Final   CO2 12/31/2022 23  22 - 32 mmol/L Final   Glucose, Bld 12/31/2022 95  70 - 99 mg/dL Final   Glucose reference range applies only to samples taken after fasting for at least 8 hours.   BUN 12/31/2022 14  6 - 20 mg/dL Final   Creatinine, Ser 12/31/2022 0.95  0.44 - 1.00 mg/dL Final   Calcium 12/31/2022 9.7  8.9 - 10.3 mg/dL Final   Total Protein 12/31/2022 7.5  6.5 - 8.1 g/dL Final   Albumin 12/31/2022 3.8  3.5 - 5.0 g/dL Final   AST 12/31/2022 24  15 - 41 U/L Final   ALT 12/31/2022 17  0 - 44 U/L Final   Alkaline Phosphatase 12/31/2022 71  38 - 126 U/L Final   Total Bilirubin 12/31/2022 1.1  0.3 - 1.2 mg/dL Final   GFR, Estimated 12/31/2022 >60  >60 mL/min Final   Comment: (NOTE) Calculated using the CKD-EPI Creatinine Equation (2021)    Anion gap 12/31/2022 12  5 - 15 Final   Performed at [facility], [place] [address], [place], [place] [address]   WBC 12/31/2022 11.0 (H)  4.0 - 10.5 K/uL Final   RBC 12/31/2022 5.03  3.87 - 5.11 MIL/uL Final   Hemoglobin 12/31/2022 13.4  12.0 - 15.0 g/dL Final   HCT 12/31/2022 42.3  36.0 - 46.0 % Final   MCV 12/31/2022 84.1  80.0 - 100.0 fL Final   MCH 12/31/2022 26.6  26.0 - 34.0 pg Final   MCHC 12/31/2022 31.7  30.0 - 36.0 g/dL Final   RDW 12/31/2022 15.9 (H)  11.5 - 15.5 % Final   Platelets 12/31/2022 331  150 - 400 K/uL Final   nRBC 12/31/2022 0.0  0.0 - 0.2 % Final   Performed at [facility], [place] [address], [place], [place] [address]   Opiates 12/31/2022 NONE DETECTED  NONE DETECTED Final   Cocaine 12/31/2022 POSITIVE (A)  NONE DETECTED Final   Benzodiazepines 12/31/2022 NONE DETECTED  NONE DETECTED Final   Amphetamines 12/31/2022 NONE DETECTED  NONE DETECTED Final   Tetrahydrocannabinol 12/31/2022 POSITIVE (A)  NONE DETECTED Final   Barbiturates 12/31/2022 NONE DETECTED  NONE DETECTED Final    Comment: (NOTE) DRUG SCREEN FOR MEDICAL PURPOSES ONLY.  IF CONFIRMATION IS NEEDED FOR ANY PURPOSE, NOTIFY LAB WITHIN 5 DAYS.  LOWEST DETECTABLE LIMITS FOR URINE DRUG SCREEN Drug Class                     Cutoff (ng/mL) Amphetamine and metabolites    1000 Barbiturate and metabolites    200 Benzodiazepine                 200 Opiates and metabolites        300 Cocaine and metabolites        300 THC                            50 Performed at [facility], [place] [address], [place], [place] [address]    I-stat hCG, quantitative 12/31/2022 <5.0  <5 mIU/mL Final   Comment 3 12/31/2022          Final   Comment:   GEST. AGE      CONC.  (mIU/mL)   <=1 WEEK        5 - 50     2 WEEKS  50 - 500     3 WEEKS       100 - 10,000     4 WEEKS     1,000 - 30,000        FEMALE AND NON-PREGNANT FEMALE:     LESS THAN 5 mIU/mL    SARS Coronavirus 2 by RT PCR 12/31/2022 NEGATIVE  NEGATIVE Final   Influenza A by PCR 12/31/2022 NEGATIVE  NEGATIVE Final   Influenza B by PCR 12/31/2022 NEGATIVE  NEGATIVE Final   Comment: (NOTE) The Xpert Xpress SARS-CoV-2/FLU/RSV plus assay is intended as an aid in the diagnosis of influenza from Nasopharyngeal swab specimens and should not be used as a sole basis for treatment. Nasal washings and aspirates are unacceptable for Xpert Xpress SARS-CoV-2/FLU/RSV testing.  Fact Sheet for Patients: EntrepreneurPulse.com.au  Fact Sheet for Healthcare Providers: IncredibleEmployment.be  This test is not yet approved or cleared by the [place] FDA and has been authorized for detection and/or diagnosis of SARS-CoV-2 by FDA under an Emergency Use Authorization (EUA). This EUA will remain in effect (meaning this test can be used) for the duration of the COVID-19 declaration under Section 564(b)(1) of the Act, 21 U.S.C. section 360bbb-3(b)(1), unless the authorization is terminated or revoked.     Resp Syncytial Virus by  PCR 12/31/2022 NEGATIVE  NEGATIVE Final   Comment: (NOTE) Fact Sheet for Patients: EntrepreneurPulse.com.au  Fact Sheet for Healthcare Providers: IncredibleEmployment.be  This test is not yet approved or cleared by the [place] FDA and has been authorized for detection and/or diagnosis of SARS-CoV-2 by FDA under an Emergency Use Authorization (EUA). This EUA will remain in effect (meaning this test can be used) for the duration of the COVID-19 declaration under Section 564(b)(1) of the Act, 21 U.S.C. section 360bbb-3(b)(1), unless the authorization is terminated or revoked.  Performed at [facility], [place] [address], [place], [place] [address]     Allergies: Morphine and related, Aspirin, Food, Lactose, Other, and Tilactase  Medications:  Facility Ordered Medications  Medication   [COMPLETED] acetaminophen (TYLENOL) tablet 650 mg   [COMPLETED] ketorolac (TORADOL) 15 MG/ML injection 15 mg   acetaminophen (TYLENOL) tablet 650 mg   alum & mag hydroxide-simeth (MAALOX/MYLANTA) 200-200-20 MG/5ML suspension 30 mL   magnesium hydroxide (MILK OF MAGNESIA) suspension 30 mL   traZODone (DESYREL) tablet 50 mg   [START ON 01/13/2023] thiamine (VITAMIN B1) tablet 100 mg   [START ON 01/13/2023] multivitamin with minerals tablet 1 tablet   LORazepam (ATIVAN) tablet 1 mg   hydrOXYzine (ATARAX) tablet 25 mg   loperamide (IMODIUM) capsule 2-4 mg   ondansetron (ZOFRAN-ODT) disintegrating tablet 4 mg   Lurasidone HCl TABS 60 mg   methocarbamol (ROBAXIN) tablet 500 mg   PTA Medications  Medication Sig   amLODipine (NORVASC) 10 MG tablet Take 10 mg by mouth every morning. (Patient not taking: Reported on 01/18/2022)   simvastatin (ZOCOR) 40 MG tablet Take 1 tablet (40 mg total) by mouth daily with supper. (Patient not taking: Reported on 01/14/2022)   hydrOXYzine (ATARAX/VISTARIL) 25 MG tablet Take 1 tablet (25 mg total) by mouth 3 (three) times daily as  needed for anxiety. (Patient not taking: Reported on 01/14/2022)   mirtazapine (REMERON) 15 MG tablet Take 1 tablet (15 mg total) by mouth at bedtime. (Patient not taking: Reported on 01/14/2022)   nicotine polacrilex (NICORETTE) 2 MG gum Take 1 each (2 mg total) by mouth as needed for smoking cessation. (Patient not taking: Reported on 01/14/2022)  QUEtiapine Fumarate 150 MG TABS Take 150 mg by mouth at bedtime. (Patient not taking: Reported on 01/14/2022)   bisacodyl (DULCOLAX) 5 MG EC tablet Take 2 tablets (10 mg total) by mouth daily as needed for moderate constipation. (Patient not taking: Reported on 01/14/2022)   pantoprazole (PROTONIX) 40 MG tablet Take 1 tablet (40 mg total) by mouth every morning. (Patient not taking: Reported on [id number])   folic acid (FOLVITE) 1 MG tablet Take 1 tablet (1 mg total) by mouth daily. (Patient not taking: Reported on 01/14/2022)   vitamin B-12 (CYANOCOBALAMIN) 1000 MCG tablet Take 1 tablet (1,000 mcg total) by mouth daily. (Patient not taking: Reported on 01/14/2022)   Melatonin 10 MG TABS Take 10 mg by mouth at bedtime.   gabapentin (NEURONTIN) 100 MG capsule Take 1 capsule (100 mg total) by mouth 3 (three) times daily. (Patient not taking: Reported on 01/18/2022)   lamoTRIgine (LAMICTAL) 25 MG tablet Take 1 tablet (25 mg total) by mouth daily. (Patient not taking: Reported on 01/14/2022)   Multiple Vitamin (MULTIVITAMIN WITH MINERALS) TABS tablet Take 1 tablet by mouth daily. (Patient not taking: Reported on 01/14/2022)   thiamine 100 MG tablet Take 1 tablet (100 mg total) by mouth daily. (Patient not taking: Reported on 01/14/2022)   Vitamin D, Ergocalciferol, (DRISDOL) 1.25 MG (50000 UNIT) CAPS capsule Take 1 capsule (50,000 Units total) by mouth every 7 (seven) days. (Patient not taking: Reported on 01/14/2022)   albuterol (VENTOLIN HFA) 108 (90 Base) MCG/ACT inhaler Inhale 1-2 puffs into the lungs every 6 (six) hours as needed for wheezing or shortness of breath.    fluticasone (FLOVENT HFA) 110 MCG/ACT inhaler Inhale 1 puff into the lungs 2 (two) times daily. (Patient not taking: Reported on 01/18/2022)   guaiFENesin (ROBITUSSIN) 100 MG/5ML liquid Take 10 mLs by mouth every 4 (four) hours as needed for to loosen phlegm. (Patient not taking: Reported on 01/14/2022)   loratadine (CLARITIN) 10 MG tablet Take 1 tablet (10 mg total) by mouth daily. (Patient not taking: Reported on 01/18/2022)   sodium chloride (OCEAN) 0.65 % SOLN nasal spray Place 1 spray into both nostrils as needed for congestion. (Patient not taking: Reported on 01/14/2022)   mupirocin ointment (BACTROBAN) 2 % Place into the nose 2 (two) times daily. Apply in Nose 2 times daily until 09/27/21. (Patient not taking: Reported on 01/14/2022)   Pseudoephedrine-DM-GG (SUDAFED COUGH PO) Take 1 capsule by mouth daily as needed (Allergies). (Patient not taking: Reported on 01/18/2022)   LORazepam (ATIVAN) 1 MG tablet Take 1 mg by mouth 4 (four) times daily as needed for anxiety.   PARoxetine (PAXIL) 20 MG tablet Take 20 mg by mouth daily.   hydrOXYzine (VISTARIL) 50 MG capsule Take 100 mg by mouth 2 (two) times daily.   prazosin (MINIPRESS) 1 MG capsule Take 1 mg by mouth 2 (two) times daily.    Long Term Goals: Improvement in symptoms so as ready for discharge  Short Term Goals: Patient will verbalize feelings in meetings with treatment team members., Patient will attend at least of 50% of the groups daily., Pt will complete the PHQ9 on admission, day 3 and discharge., Patient will participate in completing the [place], Patient will score a low risk of violence for 24 hours prior to discharge, and Patient will take medications as prescribed daily.  Medical Decision Making  [facility].  She is seeking long-term residential treatment.  Social work will be notified.    Recommendations  Based on my  evaluation the patient does not appear to have an emergency medical condition.  [facility] will [facility].  Disposition: Social work will be notified that patient is seeking long-term residential substance abuse treatment.   [facility] protocol initated.   Home medications restarted:  Latuda 60 mg daily Methocarbamol 500 mg 4 times daily as needed for muscle spasms Patient is prescribed lorazepam 1 mg every 6 hours as needed.-Patient is currently on a CIWA protocol and most recent score is 1. Will restart home Lorazepam '1mg'$  for Q8H prn for anxiety.    Revonda Humphrey, NP 01/12/23  5:16 PM

## 2023-01-12 NOTE — ED Notes (Signed)
Patient resting quietly in bed with eyes closed. Respirations equal and unlabored, skin warm and dry, NAD. No change in assessment or acuity. Q 15 minute safety checks remain in place.

## 2023-01-13 DIAGNOSIS — F1410 Cocaine abuse, uncomplicated: Secondary | ICD-10-CM | POA: Diagnosis not present

## 2023-01-13 DIAGNOSIS — F1910 Other psychoactive substance abuse, uncomplicated: Secondary | ICD-10-CM | POA: Diagnosis not present

## 2023-01-13 DIAGNOSIS — F332 Major depressive disorder, recurrent severe without psychotic features: Secondary | ICD-10-CM

## 2023-01-13 MED ORDER — MELATONIN 5 MG PO TABS
5.0000 mg | ORAL_TABLET | Freq: Every evening | ORAL | Status: DC | PRN
  Administered 2023-01-13 – 2023-01-15 (×3): 5 mg via ORAL
  Filled 2023-01-13 (×3): qty 1

## 2023-01-13 MED ORDER — AMLODIPINE BESYLATE 10 MG PO TABS
10.0000 mg | ORAL_TABLET | Freq: Every morning | ORAL | Status: DC
  Administered 2023-01-13 – 2023-01-16 (×4): 10 mg via ORAL
  Filled 2023-01-13 (×4): qty 1

## 2023-01-13 NOTE — ED Notes (Signed)
Pt sleeping in no acute distress. RR even and unlabored. Environment secured. Will continue to monitor for safety. 

## 2023-01-13 NOTE — ED Notes (Signed)
Pt is in the bed sleeping. Respirations are even and unlabored. No acute distress noted. Will continue to monitor for safety. 

## 2023-01-13 NOTE — ED Notes (Signed)
Patient resting quietly in bed with eyes closed. Respirations equal and unlabored, skin warm and dry, NAD. No change in assessment or acuity. Q 15 minute safety checks remain in place.

## 2023-01-13 NOTE — ED Notes (Signed)
Notified Pt that lunch was ready.

## 2023-01-13 NOTE — ED Notes (Signed)
Pt sitting in cafeteria eating lunch. No acute distress noted. No concerns voiced. Informed pt to notify staff with any needs or assistance. Pt verbalized understanding or agreement. Will continue to monitor for safety.

## 2023-01-13 NOTE — Tx Team (Signed)
LCSW met with patient to assess current mood, affect, physical state, and inquire about needs/goals while here in [facility] and after discharge. Patient reports she presented due to needing to change her life and get off drugs. Patient reports she has been using crack cocaine for the past 30 years and reports "she is just tired and wants to make a change". Patient reports she does not use daily, however reports she binges periodically. Patient reports she can go weeks without using, however when she does use she would binge for about two days using $280 worth of crack cocaine. Patient reports her last use was on Saturday. Patient reports she also drinks a 12 pack of beers periodically when she is bored. Patient reports her last drink was Friday after finding out that her uncle passed. Patient reports her daughter was on her way to [place] to be with family, however she was too far out to turn around get the patient so the patient reports resulting to drinking and using cocaine. Patient reports she has been living with her 41 year old daughter for the last 9 years. Patient identifies her home as a safe environment. Patient reports she is at home with her daughter and two grandchildren ages 92 and 66. Patient reports her daughter has an issue with her drug use. Patient reports current stressors is home concerns as they are about to be month to month on their lease causing the rent to be $2000 a month. Patient reports concerns regarding this. Patient reports she receives $1640 a month in Pension from the [place] after the passing of her husband 5 years ago. Patient reports this is how she is able to afford her substances. Patient denies having access to her own transportation and reports her daughter drives her around everywhere. Patient denies having any pending charges or upcoming court dates. Patient reports her current goal is to seek residential placement for substance use. Patient reports she has been to [facility] in the past, and reports she has also went at a [facility] in [place] last year, however reports she did not feel safe there. Patient reports she is not opposed to going to placement outside of [place]. Patient aware that LCSW will send referrals out for review and will follow up to provide updates as received. Patient expressed understanding and appreciation of LCSW assistance.  Patient has [employer] insurance and [employer]. No other needs were reported at this time by patient.    Lucius Conn, LCSW Clinical Social Worker [facility] Ph: [phone]

## 2023-01-13 NOTE — ED Notes (Signed)
Patient A&Ox4. Denies intent to harm self/others when asked. Denies A/VH. Patient denies any physical complaints when asked. No acute distress noted. Minimal interaction noted with peers. Appropriate on unit. Routine safety checks conducted according to facility protocol. Encouraged patient to notify staff if thoughts of harm toward self or others arise. Patient verbalize understanding and agreement. Will continue to monitor for safety.

## 2023-01-13 NOTE — ED Provider Notes (Signed)
Behavioral Health Progress Note  Date and Time: 01/13/2023 10:33 AM Name: Denise Knight MRN:  [id number]  Subjective:   The patient is a 57 year old female with a history of medication overdoses and suicidal thoughts, resulting in many inpatient psychiatric admissions.  She was most recently admitted to the [facility] behavioral hospital in November 2022 where she was given the diagnosis of bipolar 2.  She came to the emergency department on February 20 of this year reporting suicidal thoughts and was admitted to [facility].  She states that she left the facility on Friday and binged on alcohol and cocaine.  She reports coming to the behavioral urgent care for help with her addiction issues.  She was admitted to the facility based crisis.  Social history is notable for the patient dropping out of high school in the ninth grade and having a child.  This daughter, [name], number in the chart, is currently 59 years old and lives with the patient along with her 2 children ages 29 and 74.  The patient reports that her husband died 5 years ago.  She reports that she receives his pension which is $1600 per month.  The patient reports that she does very little during the day, watching TV and occasionally doing laundry.  She reports that she helps watch the children at home.  The patient reports that she binges on crack cocaine and alcohol periodically.  She states that she does this to "forget".  She endorses that she is trying to help with anxiety and depression.  She reports that her motivation to quit is "I am tired of living like this".  The patient denies ever experiencing withdrawal symptoms from alcohol.  She reports that her last drink was on Saturday.  She denies the use of any opioids.  The patient states that she is not regularly taking any psychotropic or other medication.  She desires to continue with her Latuda and her amlodipine.  Discussed with her that she cannot have Ativan given that she does not  have a regular outpatient prescriber.  The patient expressed understanding and agreed.  The patient denies experiencing severe depression recently and states that she has not had suicidal thoughts over the past couple days.  She states that she is hopeful about getting better.   Diagnosis:  Final diagnoses:  Polysubstance abuse (HCC)  Severe recurrent major depression without psychotic features ([place])    Total Time spent with patient: 20 minutes  Past Psychiatric History: as above Past Medical History: as above Family History: none Family Psychiatric  History: none Social History: as above and per H and P  Additional Social History:  See H and P                  Sleep: Fair  Appetite:  Fair   Current Medications:  Current Facility-Administered Medications  Medication Dose Route Frequency Provider Last Rate Last Admin   acetaminophen (TYLENOL) tablet 650 mg  650 mg Oral Q6H PRN Revonda Humphrey, NP   650 mg at 01/13/23 0930   alum & mag hydroxide-simeth (MAALOX/MYLANTA) 200-200-20 MG/5ML suspension 30 mL  30 mL Oral Q4H PRN Revonda Humphrey, NP       amLODipine (NORVASC) tablet 10 mg  10 mg Oral q morning Corky Sox, MD       hydrOXYzine (ATARAX) tablet 25 mg  25 mg Oral Q6H PRN Revonda Humphrey, NP   25 mg at 01/13/23 0930   loperamide (IMODIUM) capsule 2-4 mg  2-4 mg Oral PRN Revonda Humphrey, NP       LORazepam (ATIVAN) tablet 1 mg  1 mg Oral Q6H PRN Revonda Humphrey, NP       Lurasidone HCl TABS 60 mg  60 mg Oral Q breakfast Revonda Humphrey, NP   60 mg at 01/13/23 0900   magnesium hydroxide (MILK OF MAGNESIA) suspension 30 mL  30 mL Oral Daily PRN Revonda Humphrey, NP       methocarbamol (ROBAXIN) tablet 500 mg  500 mg Oral QID PRN Revonda Humphrey, NP   500 mg at 01/13/23 0930   multivitamin with minerals tablet 1 tablet  1 tablet Oral Daily Revonda Humphrey, NP   1 tablet at 01/13/23 0930   naphazoline-glycerin (CLEAR EYES REDNESS) ophth  solution 1-2 drop  1-2 drop Right Eye BID PRN Bobbitt, Hessie Diener E, NP   2 drop at 01/12/23 2256   ondansetron (ZOFRAN-ODT) disintegrating tablet 4 mg  4 mg Oral Q6H PRN Revonda Humphrey, NP       thiamine (VITAMIN B1) tablet 100 mg  100 mg Oral Daily Revonda Humphrey, NP   100 mg at 01/13/23 0930   traZODone (DESYREL) tablet 50 mg  50 mg Oral QHS PRN Revonda Humphrey, NP       Current Outpatient Medications  Medication Sig Dispense Refill   albuterol (VENTOLIN HFA) 108 (90 Base) MCG/ACT inhaler Inhale 1-2 puffs into the lungs every 6 (six) hours as needed for wheezing or shortness of breath. 1 each 0   amLODipine (NORVASC) 10 MG tablet Take 10 mg by mouth every morning. (Patient not taking: Reported on 01/18/2022)     bisacodyl (DULCOLAX) 5 MG EC tablet Take 2 tablets (10 mg total) by mouth daily as needed for moderate constipation. (Patient not taking: Reported on 01/14/2022) 30 tablet 0   fluticasone (FLOVENT HFA) 110 MCG/ACT inhaler Inhale 1 puff into the lungs 2 (two) times daily. (Patient not taking: Reported on [id number]) 1 each 0   folic acid (FOLVITE) 1 MG tablet Take 1 tablet (1 mg total) by mouth daily. (Patient not taking: Reported on 01/14/2022) 30 tablet 0   gabapentin (NEURONTIN) 100 MG capsule Take 1 capsule (100 mg total) by mouth 3 (three) times daily. (Patient not taking: Reported on 01/18/2022) 90 capsule 0   guaiFENesin (ROBITUSSIN) 100 MG/5ML liquid Take 10 mLs by mouth every 4 (four) hours as needed for to loosen phlegm. (Patient not taking: Reported on 01/14/2022) 120 mL 0   hydrOXYzine (ATARAX/VISTARIL) 25 MG tablet Take 1 tablet (25 mg total) by mouth 3 (three) times daily as needed for anxiety. (Patient not taking: Reported on 01/14/2022) 30 tablet 0   hydrOXYzine (VISTARIL) 50 MG capsule Take 100 mg by mouth 2 (two) times daily.     lamoTRIgine (LAMICTAL) 25 MG tablet Take 1 tablet (25 mg total) by mouth daily. (Patient not taking: Reported on 01/14/2022) 30 tablet 0   loratadine  (CLARITIN) 10 MG tablet Take 1 tablet (10 mg total) by mouth daily. (Patient not taking: Reported on 01/18/2022) 30 tablet 0   LORazepam (ATIVAN) 1 MG tablet Take 1 mg by mouth 4 (four) times daily as needed for anxiety.     Melatonin 10 MG TABS Take 10 mg by mouth at bedtime. 30 tablet 0   methocarbamol (ROBAXIN) 500 MG tablet Take 1,000 mg by mouth 2 (two) times daily.     mirtazapine (REMERON) 15 MG tablet Take 1 tablet (15 mg  total) by mouth at bedtime. (Patient not taking: Reported on 01/14/2022) 30 tablet 0   Multiple Vitamin (MULTIVITAMIN WITH MINERALS) TABS tablet Take 1 tablet by mouth daily. (Patient not taking: Reported on 01/14/2022) 30 tablet    mupirocin ointment (BACTROBAN) 2 % Place into the nose 2 (two) times daily. Apply in Nose 2 times daily until 09/27/21. (Patient not taking: Reported on 01/14/2022) 22 g 0   nicotine polacrilex (NICORETTE) 2 MG gum Take 1 each (2 mg total) by mouth as needed for smoking cessation. (Patient not taking: Reported on 01/14/2022) 100 tablet 0   pantoprazole (PROTONIX) 40 MG tablet Take 1 tablet (40 mg total) by mouth every morning. (Patient not taking: Reported on 01/18/2022) 30 tablet 0   PARoxetine (PAXIL) 20 MG tablet Take 20 mg by mouth daily.     prazosin (MINIPRESS) 1 MG capsule Take 1 mg by mouth 2 (two) times daily.     Pseudoephedrine-DM-GG (SUDAFED COUGH PO) Take 1 capsule by mouth daily as needed (Allergies). (Patient not taking: Reported on 01/18/2022)     QUEtiapine Fumarate 150 MG TABS Take 150 mg by mouth at bedtime. (Patient not taking: Reported on 01/14/2022) 30 tablet 0   simvastatin (ZOCOR) 40 MG tablet Take 1 tablet (40 mg total) by mouth daily with supper. (Patient not taking: Reported on 01/14/2022) 30 tablet 0   sodium chloride (OCEAN) 0.65 % SOLN nasal spray Place 1 spray into both nostrils as needed for congestion. (Patient not taking: Reported on 01/14/2022) 15 mL 0   thiamine 100 MG tablet Take 1 tablet (100 mg total) by mouth daily. (Patient  not taking: Reported on 01/14/2022) 30 tablet 0   vitamin B-12 (CYANOCOBALAMIN) 1000 MCG tablet Take 1 tablet (1,000 mcg total) by mouth daily. (Patient not taking: Reported on 01/14/2022) 30 tablet 0   Vitamin D, Ergocalciferol, (DRISDOL) 1.25 MG (50000 UNIT) CAPS capsule Take 1 capsule (50,000 Units total) by mouth every 7 (seven) days. (Patient not taking: Reported on 01/14/2022) 5 capsule 0    Labs  Lab Results:  Admission on 01/12/2023, Discharged on 01/12/2023  Component Date Value Ref Range Status   Sodium 01/12/2023 136  135 - 145 mmol/L Final   Potassium 01/12/2023 4.2  3.5 - 5.1 mmol/L Final   Chloride 01/12/2023 103  98 - 111 mmol/L Final   CO2 01/12/2023 22  22 - 32 mmol/L Final   Glucose, Bld 01/12/2023 103 (H)  70 - 99 mg/dL Final   Glucose reference range applies only to samples taken after fasting for at least 8 hours.   BUN 01/12/2023 14  6 - 20 mg/dL Final   Creatinine, Ser 01/12/2023 1.23 (H)  0.44 - 1.00 mg/dL Final   Calcium 01/12/2023 10.0  8.9 - 10.3 mg/dL Final   GFR, Estimated 01/12/2023 51 (L)  >60 mL/min Final   Comment: (NOTE) Calculated using the CKD-EPI Creatinine Equation (2021)    Anion gap 01/12/2023 11  5 - 15 Final   Performed at [facility] [address], [place], [place] [address]   WBC 01/12/2023 9.9  4.0 - 10.5 K/uL Final   RBC 01/12/2023 5.18 (H)  3.87 - 5.11 MIL/uL Final   Hemoglobin 01/12/2023 13.8  12.0 - 15.0 g/dL Final   HCT 01/12/2023 43.1  36.0 - 46.0 % Final   MCV 01/12/2023 83.2  80.0 - 100.0 fL Final   MCH 01/12/2023 26.6  26.0 - 34.0 pg Final   MCHC 01/12/2023 32.0  30.0 - 36.0  g/dL Final   RDW 01/12/2023 15.8 (H)  11.5 - 15.5 % Final   Platelets 01/12/2023 360  150 - 400 K/uL Final   nRBC 01/12/2023 0.0  0.0 - 0.2 % Final   Neutrophils Relative % 01/12/2023 62  % Final   Neutro Abs 01/12/2023 6.2  1.7 - 7.7 K/uL Final   Lymphocytes Relative 01/12/2023 27  % Final   Lymphs Abs 01/12/2023 2.7  0.7 - 4.0 K/uL Final   Monocytes  Relative 01/12/2023 7  % Final   Monocytes Absolute 01/12/2023 0.7  0.1 - 1.0 K/uL Final   Eosinophils Relative 01/12/2023 3  % Final   Eosinophils Absolute 01/12/2023 0.3  0.0 - 0.5 K/uL Final   Basophils Relative 01/12/2023 1  % Final   Basophils Absolute 01/12/2023 0.1  0.0 - 0.1 K/uL Final   Immature Granulocytes 01/12/2023 0  % Final   Abs Immature Granulocytes 01/12/2023 0.03  0.00 - 0.07 K/uL Final   Performed at [facility], [place] [address] [place], [place] [address]   Magnesium 01/12/2023 2.1  1.7 - 2.4 mg/dL Final   Performed at [facility] [address], [place], [place] [address]   Troponin I (High Sensitivity) 01/12/2023 9  <18 ng/L Final   Comment: (NOTE) Elevated high sensitivity troponin I (hsTnI) values and significant  changes across serial measurements may suggest ACS but many other  chronic and acute conditions are known to elevate hsTnI results.  Refer to the "Links" section for chest pain algorithms and additional  guidance. Performed at [facility], [place] [address] [place], [place] [address]    Acetaminophen (Tylenol), Serum 01/12/2023 <10 (L)  10 - 30 ug/mL Final   Comment: (NOTE) Therapeutic concentrations vary significantly. A range of 10-30 ug/mL  may be an effective concentration for many patients. However, some  are best treated at concentrations outside of this range. Acetaminophen concentrations >150 ug/mL at 4 hours after ingestion  and >50 ug/mL at 12 hours after ingestion are often associated with  toxic reactions.  Performed at [facility], [place] [address] [place], [place] [id number]    Salicylate Lvl [id number] <7.0 (L)  7.0 - 30.0 mg/dL Final   Performed at [facility] [address] [place], [place] [address]   Opiates 01/12/2023 NONE DETECTED  NONE DETECTED Final   Cocaine 01/12/2023 POSITIVE (A)  NONE DETECTED Final   Benzodiazepines 01/12/2023 NONE DETECTED  NONE DETECTED Final   Amphetamines  01/12/2023 NONE DETECTED  NONE DETECTED Final   Tetrahydrocannabinol 01/12/2023 NONE DETECTED  NONE DETECTED Final   Barbiturates 01/12/2023 NONE DETECTED  NONE DETECTED Final   Comment: (NOTE) DRUG SCREEN FOR MEDICAL PURPOSES ONLY.  IF CONFIRMATION IS NEEDED FOR ANY PURPOSE, NOTIFY LAB WITHIN 5 DAYS.  LOWEST DETECTABLE LIMITS FOR URINE DRUG SCREEN Drug Class                     Cutoff (ng/mL) Amphetamine and metabolites    1000 Barbiturate and metabolites    200 Benzodiazepine                 200 Opiates and metabolites        300 Cocaine and metabolites        300 THC                            50 Performed at [facility], [place] [address], [place], [place] [address]    Alcohol, Ethyl (B) 01/12/2023 <10  <10 mg/dL Final   Comment: (NOTE) Lowest detectable limit for serum alcohol is 10 mg/dL.  For medical purposes only. Performed at [facility], [place] [address], [place], [place] [address]    Troponin I (High Sensitivity) 01/12/2023 12  <18 ng/L Final   Comment: (NOTE) Elevated high sensitivity troponin I (hsTnI) values and significant  changes across serial measurements may suggest ACS but many other  chronic and acute conditions are known to elevate hsTnI results.  Refer to the "Links" section for chest pain algorithms and additional  guidance. Performed at [facility], [place] [address], [place], [place] [address]    I-stat hCG, quantitative 01/12/2023 <5.0  <5 mIU/mL Final   Comment 3 01/12/2023          Final   Comment:   GEST. AGE      CONC.  (mIU/mL)   <=1 WEEK        5 - 50     2 WEEKS       50 - 500     3 WEEKS       100 - 10,000     4 WEEKS     1,000 - 30,000        FEMALE AND NON-PREGNANT FEMALE:     LESS THAN 5 mIU/mL    SARS Coronavirus 2 by RT PCR 01/12/2023 NEGATIVE  NEGATIVE Final   Influenza A by PCR 01/12/2023 NEGATIVE  NEGATIVE Final   Influenza B by PCR 01/12/2023 NEGATIVE  NEGATIVE Final   Comment: (NOTE) The Xpert Xpress  SARS-CoV-2/FLU/RSV plus assay is intended as an aid in the diagnosis of influenza from Nasopharyngeal swab specimens and should not be used as a sole basis for treatment. Nasal washings and aspirates are unacceptable for Xpert Xpress SARS-CoV-2/FLU/RSV testing.  Fact Sheet for Patients: EntrepreneurPulse.com.au  Fact Sheet for Healthcare Providers: IncredibleEmployment.be  This test is not yet approved or cleared by the [place] FDA and has been authorized for detection and/or diagnosis of SARS-CoV-2 by FDA under an Emergency Use Authorization (EUA). This EUA will remain in effect (meaning this test can be used) for the duration of the COVID-19 declaration under Section 564(b)(1) of the Act, 21 U.S.C. section 360bbb-3(b)(1), unless the authorization is terminated or revoked.     Resp Syncytial Virus by PCR 01/12/2023 NEGATIVE  NEGATIVE Final   Comment: (NOTE) Fact Sheet for Patients: EntrepreneurPulse.com.au  Fact Sheet for Healthcare Providers: IncredibleEmployment.be  This test is not yet approved or cleared by the [place] FDA and has been authorized for detection and/or diagnosis of SARS-CoV-2 by FDA under an Emergency Use Authorization (EUA). This EUA will remain in effect (meaning this test can be used) for the duration of the COVID-19 declaration under Section 564(b)(1) of the Act, 21 U.S.C. section 360bbb-3(b)(1), unless the authorization is terminated or revoked.  Performed at [facility], [place] [address] [place],  [address]   Admission on 12/31/2022, Discharged on 01/01/2023  Component Date Value Ref Range Status   Sodium 12/31/2022 137  135 - 145 mmol/L Final   Potassium 12/31/2022 4.2  3.5 - 5.1 mmol/L Final   Chloride 12/31/2022 102  98 - 111 mmol/L Final   CO2 12/31/2022 23  22 - 32 mmol/L Final   Glucose, Bld 12/31/2022 95  70 - 99 mg/dL Final   Glucose reference  range applies only  to samples taken after fasting for at least 8 hours.   BUN 12/31/2022 14  6 - 20 mg/dL Final   Creatinine, Ser 12/31/2022 0.95  0.44 - 1.00 mg/dL Final   Calcium 12/31/2022 9.7  8.9 - 10.3 mg/dL Final   Total Protein 12/31/2022 7.5  6.5 - 8.1 g/dL Final   Albumin 12/31/2022 3.8  3.5 - 5.0 g/dL Final   AST 12/31/2022 24  15 - 41 U/L Final   ALT 12/31/2022 17  0 - 44 U/L Final   Alkaline Phosphatase 12/31/2022 71  38 - 126 U/L Final   Total Bilirubin 12/31/2022 1.1  0.3 - 1.2 mg/dL Final   GFR, Estimated 12/31/2022 >60  >60 mL/min Final   Comment: (NOTE) Calculated using the CKD-EPI Creatinine Equation (2021)    Anion gap 12/31/2022 12  5 - 15 Final   Performed at [facility], [place] [address] [place], [place] [address]   WBC 12/31/2022 11.0 (H)  4.0 - 10.5 K/uL Final   RBC 12/31/2022 5.03  3.87 - 5.11 MIL/uL Final   Hemoglobin 12/31/2022 13.4  12.0 - 15.0 g/dL Final   HCT 12/31/2022 42.3  36.0 - 46.0 % Final   MCV 12/31/2022 84.1  80.0 - 100.0 fL Final   MCH 12/31/2022 26.6  26.0 - 34.0 pg Final   MCHC 12/31/2022 31.7  30.0 - 36.0 g/dL Final   RDW 12/31/2022 15.9 (H)  11.5 - 15.5 % Final   Platelets 12/31/2022 331  150 - 400 K/uL Final   nRBC 12/31/2022 0.0  0.0 - 0.2 % Final   Performed at [facility], [place] [address], [place], [place] [address]   Opiates 12/31/2022 NONE DETECTED  NONE DETECTED Final   Cocaine 12/31/2022 POSITIVE (A)  NONE DETECTED Final   Benzodiazepines 12/31/2022 NONE DETECTED  NONE DETECTED Final   Amphetamines 12/31/2022 NONE DETECTED  NONE DETECTED Final   Tetrahydrocannabinol 12/31/2022 POSITIVE (A)  NONE DETECTED Final   Barbiturates 12/31/2022 NONE DETECTED  NONE DETECTED Final   Comment: (NOTE) DRUG SCREEN FOR MEDICAL PURPOSES ONLY.  IF CONFIRMATION IS NEEDED FOR ANY PURPOSE, NOTIFY LAB WITHIN 5 DAYS.  LOWEST DETECTABLE LIMITS FOR URINE DRUG SCREEN Drug Class                     Cutoff (ng/mL) Amphetamine and  metabolites    1000 Barbiturate and metabolites    200 Benzodiazepine                 200 Opiates and metabolites        300 Cocaine and metabolites        300 THC                            50 Performed at [facility], [place] [address], [place], [place] [address]    I-stat hCG, quantitative 12/31/2022 <5.0  <5 mIU/mL Final   Comment 3 12/31/2022          Final   Comment:   GEST. AGE      CONC.  (mIU/mL)   <=1 WEEK        5 - 50     2 WEEKS       50 - 500     3 WEEKS       100 - 10,000     4 WEEKS     1,000 - 30,000  FEMALE AND NON-PREGNANT FEMALE:     LESS THAN 5 mIU/mL    SARS Coronavirus 2 by RT PCR 12/31/2022 NEGATIVE  NEGATIVE Final   Influenza A by PCR 12/31/2022 NEGATIVE  NEGATIVE Final   Influenza B by PCR 12/31/2022 NEGATIVE  NEGATIVE Final   Comment: (NOTE) The Xpert Xpress SARS-CoV-2/FLU/RSV plus assay is intended as an aid in the diagnosis of influenza from Nasopharyngeal swab specimens and should not be used as a sole basis for treatment. Nasal washings and aspirates are unacceptable for Xpert Xpress SARS-CoV-2/FLU/RSV testing.  Fact Sheet for Patients: EntrepreneurPulse.com.au  Fact Sheet for Healthcare Providers: IncredibleEmployment.be  This test is not yet approved or cleared by the [place] FDA and has been authorized for detection and/or diagnosis of SARS-CoV-2 by FDA under an Emergency Use Authorization (EUA). This EUA will remain in effect (meaning this test can be used) for the duration of the COVID-19 declaration under Section 564(b)(1) of the Act, 21 U.S.C. section 360bbb-3(b)(1), unless the authorization is terminated or revoked.     Resp Syncytial Virus by PCR 12/31/2022 NEGATIVE  NEGATIVE Final   Comment: (NOTE) Fact Sheet for Patients: EntrepreneurPulse.com.au  Fact Sheet for Healthcare Providers: IncredibleEmployment.be  This test is not yet approved or  cleared by the [place] FDA and has been authorized for detection and/or diagnosis of SARS-CoV-2 by FDA under an Emergency Use Authorization (EUA). This EUA will remain in effect (meaning this test can be used) for the duration of the COVID-19 declaration under Section 564(b)(1) of the Act, 21 U.S.C. section 360bbb-3(b)(1), unless the authorization is terminated or revoked.  Performed at [facility], [place] [address], [place], [place] [address]     Blood Alcohol level:  Lab Results  Component Value Date   [facility] <10 01/12/2023   ETH <10 [id number]    Metabolic Disorder Labs: Lab Results  Component Value Date   HGBA1C 5.7 (H) 09/03/2021   MPG 116.89 09/03/2021   MPG 116.89 01/13/2020   No results found for: "PROLACTIN" Lab Results  Component Value Date   CHOL 230 (H) 09/03/2021   TRIG 111 09/03/2021   HDL 62 09/03/2021   CHOLHDL 3.7 09/03/2021   VLDL 22 09/03/2021   LDLCALC 146 (H) 09/03/2021   LDLCALC 114 (H) 01/13/2020    Therapeutic Lab Levels: Lab Results  Component Value Date   LITHIUM 0.39 (L) 07/05/2016   No results found for: "VALPROATE" No results found for: "CBMZ"  Physical Findings   AIMS    Flowsheet Row Admission (Discharged) from 09/18/2021 in [facility] Admission (Discharged) from 09/02/2021 in [facility]B Admission (Discharged) from 01/12/2020 in [facility] 300B Admission (Discharged) from 06/01/2019 in [facility]B  AIMS Total Score 0 0 0 0      AUDIT    Flowsheet Row Admission (Discharged) from 09/18/2021 in [facility] 400B Admission (Discharged) from 09/02/2021 in [facility] 300B Admission (Discharged) from 01/12/2020 in [facility] 300B Admission (Discharged) from 06/01/2019 in [facility] 500B   Alcohol Use Disorder Identification Test Final Score (AUDIT) '16 14 4 '$ 0      PHQ2-9    Flowsheet Row ED from 01/12/2023 in [facility]  PHQ-2 Total Score 2  PHQ-9 Total Score 11      Flowsheet Row ED from 01/12/2023 in [facility] Most  recent reading at 01/12/2023  5:23 PM ED from 01/12/2023 in [facility] Emergency Department at [facility] Most recent reading at 01/12/2023  9:29 AM ED from 12/31/2022 in [facility] Department at [facility] Most recent reading at 12/31/2022  9:06 AM  C-SSRS RISK CATEGORY No Risk High Risk High Risk        Musculoskeletal  Strength & Muscle Tone: within normal limits Gait & Station: normal Patient leans: N/A  Psychiatric Specialty Exam  Presentation General Appearance: Appropriate for Environment  Eye Contact:Fair  Speech:Clear and Coherent  Speech Volume:Normal  Handedness:-- (not assessed)   Mood and Affect  Mood:Euthymic  Affect:Congruent   Thought Process  Thought Processes:Coherent; Linear  Descriptions of Associations:Intact  Orientation:Full (Time, Place and Person)  Thought Content:Logical    Hallucinations:Hallucinations: None  Ideas of Reference:None  Suicidal Thoughts:Suicidal Thoughts: No  Homicidal Thoughts:Homicidal Thoughts: No   Sensorium  Memory:Immediate Fair; Recent Fair; Remote Fair  Judgment:Fair  Insight:Fair   Executive Functions  Concentration:Fair  Attention Span:Fair  [place]   Psychomotor Activity  Psychomotor Activity:Psychomotor Activity: Normal   Assets  Assets:Communication Skills; Resilience   Sleep  Sleep:Sleep: Fair   Nutritional Assessment (For OBS and FBC admissions only) Has the patient had a weight loss or gain of 10 pounds or more in the last 3 months?: No Has the patient had a decrease in food intake/or appetite?: Yes Does the  patient have dental problems?: No Does the patient have eating habits or behaviors that may be indicators of an eating disorder including binging or inducing vomiting?: No Has the patient recently lost weight without trying?: 0 Has the patient been eating poorly because of a decreased appetite?: 0 Malnutrition Screening Tool Score: 0    Physical Exam Constitutional:      Appearance: the patient is not toxic-appearing.  Pulmonary:     Effort: Pulmonary effort is normal.  Neurological:     General: No focal deficit present.     Mental Status: the patient is alert and oriented to person, place, and time.   Review of Systems  Respiratory:  Negative for shortness of breath.   Cardiovascular:  Negative for chest pain.  Gastrointestinal:  Negative for abdominal pain, constipation, diarrhea, nausea and vomiting.  Neurological:  Negative for headaches.    BP (!) 133/90   Pulse 78   Temp 97.9 F (36.6 C) (Tympanic)   Resp 16   SpO2 97%   Assessment and Plan:  Status: Voluntary, no recent self-harm behavior, patient can be discharged AMA should she request  Alcohol and cocaine use disorders - CIWA with as needed Ativan - Residential rehab placement  Bipolar 2 - Continue Latuda 60 mg daily  Medical: - Creatinine of 1.2, recheck - UDS with cocaine, other labs unremarkable - Continue home Robaxin     Corky Sox, MD 01/13/2023 10:33 AM

## 2023-01-13 NOTE — ED Notes (Signed)
Pt is in the bed resting. Respirations are even and unlabored. No acute distress noted. Will continue to monitor for safety.

## 2023-01-13 NOTE — Discharge Planning (Signed)
LCSW followed up with Admissions Coordinator Estill Bamberg regarding referral. Per Estill Bamberg, referral has been received and is currently under review. Estill Bamberg will follow up to provide update as received.   Lucius Conn, LCSW Clinical Social Worker [facility] Ph: [phone]

## 2023-01-13 NOTE — Discharge Planning (Signed)
LCSW contacted [employer] to explore if agency is in network with her insurance. Per [employer], agency is in network. Referral has been sent for review. LCSW will provide updates as received.   Lucius Conn, LCSW Clinical Social Worker [facility] Ph: [phone]

## 2023-01-14 DIAGNOSIS — F1410 Cocaine abuse, uncomplicated: Secondary | ICD-10-CM | POA: Diagnosis not present

## 2023-01-14 LAB — BASIC METABOLIC PANEL
Anion gap: 10 (ref 5–15)
BUN: 19 mg/dL (ref 6–20)
CO2: 21 mmol/L — ABNORMAL LOW (ref 22–32)
Calcium: 9.8 mg/dL (ref 8.9–10.3)
Chloride: 107 mmol/L (ref 98–111)
Creatinine, Ser: 1.2 mg/dL — ABNORMAL HIGH (ref 0.44–1.00)
GFR, Estimated: 52 mL/min — ABNORMAL LOW (ref >60)
Glucose, Bld: 103 mg/dL — ABNORMAL HIGH (ref 70–99)
Potassium: 4 mmol/L (ref 3.5–5.1)
Sodium: 138 mmol/L (ref 135–145)

## 2023-01-14 LAB — CBC WITH DIFFERENTIAL/PLATELET
Abs Immature Granulocytes: 0.04 10*3/uL (ref 0.00–0.07)
Basophils Absolute: 0.1 10*3/uL (ref 0.0–0.1)
Basophils Relative: 1 %
Eosinophils Absolute: 0.4 10*3/uL (ref 0.0–0.5)
Eosinophils Relative: 4 %
HCT: 39.4 % (ref 36.0–46.0)
Hemoglobin: 13.1 g/dL (ref 12.0–15.0)
Immature Granulocytes: 0 %
Lymphocytes Relative: 38 %
Lymphs Abs: 3.7 10*3/uL (ref 0.7–4.0)
MCH: 27.5 pg (ref 26.0–34.0)
MCHC: 33.2 g/dL (ref 30.0–36.0)
MCV: 82.8 fL (ref 80.0–100.0)
Monocytes Absolute: 0.7 10*3/uL (ref 0.1–1.0)
Monocytes Relative: 7 %
Neutro Abs: 4.9 10*3/uL (ref 1.7–7.7)
Neutrophils Relative %: 50 %
Platelets: 350 10*3/uL (ref 150–400)
RBC: 4.76 MIL/uL (ref 3.87–5.11)
RDW: 15.8 % — ABNORMAL HIGH (ref 11.5–15.5)
WBC: 9.7 10*3/uL (ref 4.0–10.5)
nRBC: 0 % (ref 0.0–0.2)

## 2023-01-14 MED ORDER — TRIAMCINOLONE ACETONIDE 0.1 % EX CREA
TOPICAL_CREAM | Freq: Two times a day (BID) | CUTANEOUS | Status: DC
  Filled 2023-01-14: qty 15

## 2023-01-14 MED ORDER — NYSTATIN 100000 UNIT/GM EX CREA
TOPICAL_CREAM | Freq: Two times a day (BID) | CUTANEOUS | Status: DC
  Filled 2023-01-14: qty 30

## 2023-01-14 MED ORDER — PANTOPRAZOLE SODIUM 40 MG PO TBEC
40.0000 mg | DELAYED_RELEASE_TABLET | Freq: Every day | ORAL | Status: DC
  Administered 2023-01-14 – 2023-01-16 (×4): 40 mg via ORAL
  Filled 2023-01-14 (×4): qty 1

## 2023-01-14 MED ORDER — PRAZOSIN HCL 1 MG PO CAPS: 1.0000 mg | ORAL_CAPSULE | Freq: Every day | ORAL | Status: DC

## 2023-01-14 MED ORDER — NYSTATIN-TRIAMCINOLONE 100000-0.1 UNIT/GM-% EX CREA: 1.0000 | TOPICAL_CREAM | Freq: Two times a day (BID) | CUTANEOUS | Status: DC

## 2023-01-14 NOTE — ED Notes (Addendum)
Writer attempted to draw blood from R AC x 1 stick unsuccessful. Pt encouraged to drink fluids due to poor venous access and rolling viens. Pt verbalized understanding. MD made aware via secure chat. Safety maintained.

## 2023-01-14 NOTE — ED Notes (Signed)
Patient A&Ox4. Denies intent to harm self/others when asked. Denies A/VH. Patient c/o muscle spasms to L arm. Pt received Robaxin for spasms. Support and encouragement provided. Routine safety checks conducted according to facility protocol. Encouraged patient to notify staff if thoughts of harm toward self or others arise. Patient verbalize understanding and agreement. Will continue to monitor for safety.

## 2023-01-14 NOTE — ED Notes (Signed)
Pt is in the bed sleeping. Respirations are even and unlabored. No acute distress noted. Will continue to monitor for safety. 

## 2023-01-14 NOTE — ED Notes (Signed)
Pt sleeping in no acute distress. RR even and unlabored. Environment secured. Will continue to monitor for safety. 

## 2023-01-14 NOTE — ED Notes (Signed)
Pt alerted staff to blood in stool, bright red in color.  Pt states she feels a pain in her abdomen.  Provider made aware,  CBC ordered for this evening.  Will continue to monitor for safety.

## 2023-01-14 NOTE — ED Provider Notes (Cosign Needed Addendum)
Behavioral Health Progress Note  Date and Time: 01/14/2023 12:26 PM Name: Denise Knight MRN:  [id number]  Subjective:   On reevaluation 3/5, the patient reports nightmares and states that she has had previous success with prazosin.  Also discussed that she would like triamcinolone for eczema and Protonix for GERD.  Discussed that LCSW will look for residential rehab consistent with her insurance. Addendum: Informed about bright red blood in the patient's stool.  Will check CBC tonight.  On assessment the patient is in no acute distress, abdomen is soft, mildly tender to palpation in the left lower quadrant.  The patient reports having hemorrhoids previously but says she has not had blood in her stool in many years.  Discussed with nursing staff and the patient that we will need to monitor for blood in the stool going forward.   The patient is a 57 year old female with a history of medication overdoses and suicidal thoughts, resulting in many inpatient psychiatric admissions.  She was most recently admitted to the [facility] behavioral hospital in November 2022 where she was given the diagnosis of bipolar 2.  She came to the emergency department on February 20 of this year reporting suicidal thoughts and was admitted to [facility].  She states that she left the facility on Friday and binged on alcohol and cocaine.  She reports coming to the behavioral urgent care for help with her addiction issues.  She was admitted to the facility based crisis.  Social history is notable for the patient dropping out of high school in the ninth grade and having a child.  This daughter, Denise Knight, number in the chart, is currently 59 years old and lives with the patient along with her 2 children ages 55 and 80.  The patient reports that her husband died 5 years ago.  She reports that she receives his pension which is $1600 per month.  The patient reports that she does very little during the day, watching TV and occasionally  doing laundry.  She reports that she helps watch the children at home.  The patient reports that she binges on crack cocaine and alcohol periodically.  She states that she does this to "forget".  She endorses that she is trying to help with anxiety and depression.  She reports that her motivation to quit is "I am tired of living like this".  The patient denies ever experiencing withdrawal symptoms from alcohol.  She reports that her last drink was on Saturday.  She denies the use of any opioids.  The patient states that she is not regularly taking any psychotropic or other medication.  She desires to continue with her Latuda and her amlodipine.  Discussed with her that she cannot have Ativan given that she does not have a regular outpatient prescriber.  The patient expressed understanding and agreed.  The patient denies experiencing severe depression recently and states that she has not had suicidal thoughts over the past couple days.  She states that she is hopeful about getting better.   Diagnosis:  Final diagnoses:  Polysubstance abuse ([place])  Severe recurrent major depression without psychotic features ([place])    Total Time spent with patient: 20 minutes  Past Psychiatric History: as above Past Medical History: as above Family History: none Family Psychiatric  History: none Social History: as above and per H and P  Additional Social History:  See H and P                  Sleep:  Fair  Appetite:  Fair   Current Medications:  Current Facility-Administered Medications  Medication Dose Route Frequency Provider Last Rate Last Admin   acetaminophen (TYLENOL) tablet 650 mg  650 mg Oral Q6H PRN Revonda Humphrey, NP   650 mg at 01/13/23 0930   alum & mag hydroxide-simeth (MAALOX/MYLANTA) 200-200-20 MG/5ML suspension 30 mL  30 mL Oral Q4H PRN Revonda Humphrey, NP       amLODipine (NORVASC) tablet 10 mg  10 mg Oral q morning Corky Sox, MD   10 mg at 01/14/23 [id number]    hydrOXYzine (ATARAX) tablet 25 mg  25 mg Oral Q6H PRN Revonda Humphrey, NP   25 mg at 01/14/23 [id number]   loperamide (IMODIUM) capsule 2-4 mg  2-4 mg Oral PRN Revonda Humphrey, NP       LORazepam (ATIVAN) tablet 1 mg  1 mg Oral Q6H PRN Revonda Humphrey, NP       Lurasidone HCl TABS 60 mg  60 mg Oral Q breakfast Revonda Humphrey, NP   60 mg at 01/14/23 [id number]   magnesium hydroxide (MILK OF MAGNESIA) suspension 30 mL  30 mL Oral Daily PRN Revonda Humphrey, NP       melatonin tablet 5 mg  5 mg Oral QHS PRN Onuoha, Chinwendu V, NP   5 mg at 01/13/23 2123   methocarbamol (ROBAXIN) tablet 500 mg  500 mg Oral QID PRN Revonda Humphrey, NP   500 mg at 01/14/23 [id number]   multivitamin with minerals tablet 1 tablet  1 tablet Oral Daily Revonda Humphrey, NP   1 tablet at 01/14/23 [id number]   naphazoline-glycerin (CLEAR EYES REDNESS) ophth solution 1-2 drop  1-2 drop Right Eye BID PRN Bobbitt, Hessie Diener E, NP   2 drop at 01/12/23 2256   nystatin-triamcinolone (MYCOLOG II) cream 1 Application  1 Application Topical BID Corky Sox, MD       ondansetron (ZOFRAN-ODT) disintegrating tablet 4 mg  4 mg Oral Q6H PRN Revonda Humphrey, NP       pantoprazole (PROTONIX) EC tablet 40 mg  40 mg Oral Daily Corky Sox, MD       prazosin (MINIPRESS) capsule 1 mg  1 mg Oral QHS Corky Sox, MD       thiamine (VITAMIN B1) tablet 100 mg  100 mg Oral Daily Revonda Humphrey, NP   100 mg at 01/14/23 [id number]   Current Outpatient Medications  Medication Sig Dispense Refill   albuterol (VENTOLIN HFA) 108 (90 Base) MCG/ACT inhaler Inhale 1-2 puffs into the lungs every 6 (six) hours as needed for wheezing or shortness of breath. 1 each 0   amLODipine (NORVASC) 10 MG tablet Take 10 mg by mouth every morning. (Patient not taking: Reported on 01/18/2022)     bisacodyl (DULCOLAX) 5 MG EC tablet Take 2 tablets (10 mg total) by mouth daily as needed for moderate constipation. (Patient not taking: Reported on 01/14/2022) 30 tablet 0    fluticasone (FLOVENT HFA) 110 MCG/ACT inhaler Inhale 1 puff into the lungs 2 (two) times daily. (Patient not taking: Reported on [id number]) 1 each 0   folic acid (FOLVITE) 1 MG tablet Take 1 tablet (1 mg total) by mouth daily. (Patient not taking: Reported on 01/14/2022) 30 tablet 0   gabapentin (NEURONTIN) 100 MG capsule Take 1 capsule (100 mg total) by mouth 3 (three) times daily. (Patient not taking: Reported on 01/18/2022) 90 capsule 0   guaiFENesin (ROBITUSSIN) 100 MG/5ML liquid Take 10 mLs  by mouth every 4 (four) hours as needed for to loosen phlegm. (Patient not taking: Reported on 01/14/2022) 120 mL 0   hydrOXYzine (ATARAX/VISTARIL) 25 MG tablet Take 1 tablet (25 mg total) by mouth 3 (three) times daily as needed for anxiety. (Patient not taking: Reported on 01/14/2022) 30 tablet 0   hydrOXYzine (VISTARIL) 50 MG capsule Take 100 mg by mouth 2 (two) times daily.     lamoTRIgine (LAMICTAL) 25 MG tablet Take 1 tablet (25 mg total) by mouth daily. (Patient not taking: Reported on 01/14/2022) 30 tablet 0   loratadine (CLARITIN) 10 MG tablet Take 1 tablet (10 mg total) by mouth daily. (Patient not taking: Reported on 01/18/2022) 30 tablet 0   LORazepam (ATIVAN) 1 MG tablet Take 1 mg by mouth 4 (four) times daily as needed for anxiety.     Melatonin 10 MG TABS Take 10 mg by mouth at bedtime. 30 tablet 0   methocarbamol (ROBAXIN) 500 MG tablet Take 1,000 mg by mouth 2 (two) times daily.     mirtazapine (REMERON) 15 MG tablet Take 1 tablet (15 mg total) by mouth at bedtime. (Patient not taking: Reported on 01/14/2022) 30 tablet 0   Multiple Vitamin (MULTIVITAMIN WITH MINERALS) TABS tablet Take 1 tablet by mouth daily. (Patient not taking: Reported on 01/14/2022) 30 tablet    mupirocin ointment (BACTROBAN) 2 % Place into the nose 2 (two) times daily. Apply in Nose 2 times daily until 09/27/21. (Patient not taking: Reported on 01/14/2022) 22 g 0   nicotine polacrilex (NICORETTE) 2 MG gum Take 1 each (2 mg total) by mouth  as needed for smoking cessation. (Patient not taking: Reported on 01/14/2022) 100 tablet 0   pantoprazole (PROTONIX) 40 MG tablet Take 1 tablet (40 mg total) by mouth every morning. (Patient not taking: Reported on 01/18/2022) 30 tablet 0   PARoxetine (PAXIL) 20 MG tablet Take 20 mg by mouth daily.     prazosin (MINIPRESS) 1 MG capsule Take 1 mg by mouth 2 (two) times daily.     Pseudoephedrine-DM-GG (SUDAFED COUGH PO) Take 1 capsule by mouth daily as needed (Allergies). (Patient not taking: Reported on 01/18/2022)     QUEtiapine Fumarate 150 MG TABS Take 150 mg by mouth at bedtime. (Patient not taking: Reported on 01/14/2022) 30 tablet 0   simvastatin (ZOCOR) 40 MG tablet Take 1 tablet (40 mg total) by mouth daily with supper. (Patient not taking: Reported on 01/14/2022) 30 tablet 0   sodium chloride (OCEAN) 0.65 % SOLN nasal spray Place 1 spray into both nostrils as needed for congestion. (Patient not taking: Reported on 01/14/2022) 15 mL 0   thiamine 100 MG tablet Take 1 tablet (100 mg total) by mouth daily. (Patient not taking: Reported on 01/14/2022) 30 tablet 0   vitamin B-12 (CYANOCOBALAMIN) 1000 MCG tablet Take 1 tablet (1,000 mcg total) by mouth daily. (Patient not taking: Reported on 01/14/2022) 30 tablet 0   Vitamin D, Ergocalciferol, (DRISDOL) 1.25 MG (50000 UNIT) CAPS capsule Take 1 capsule (50,000 Units total) by mouth every 7 (seven) days. (Patient not taking: Reported on 01/14/2022) 5 capsule 0    Labs  Lab Results:  Admission on 01/12/2023  Component Date Value Ref Range Status   Sodium 01/14/2023 138  135 - 145 mmol/L Final   Potassium 01/14/2023 4.0  3.5 - 5.1 mmol/L Final   Chloride 01/14/2023 107  98 - 111 mmol/L Final   CO2 01/14/2023 21 (L)  22 - 32 mmol/L Final   Glucose,  Bld 01/14/2023 103 (H)  70 - 99 mg/dL Final   Glucose reference range applies only to samples taken after fasting for at least 8 hours.   BUN 01/14/2023 19  6 - 20 mg/dL Final   Creatinine, Ser 01/14/2023 1.20 (H)   0.44 - 1.00 mg/dL Final   Calcium 01/14/2023 9.8  8.9 - 10.3 mg/dL Final   GFR, Estimated 01/14/2023 52 (L)  >60 mL/min Final   Comment: (NOTE) Calculated using the CKD-EPI Creatinine Equation (2021)    Anion gap 01/14/2023 10  5 - 15 Final   Performed at [facility], [place] [address] [place], [place] [address]  Admission on 01/12/2023, Discharged on 01/12/2023  Component Date Value Ref Range Status   Sodium 01/12/2023 136  135 - 145 mmol/L Final   Potassium 01/12/2023 4.2  3.5 - 5.1 mmol/L Final   Chloride 01/12/2023 103  98 - 111 mmol/L Final   CO2 01/12/2023 22  22 - 32 mmol/L Final   Glucose, Bld 01/12/2023 103 (H)  70 - 99 mg/dL Final   Glucose reference range applies only to samples taken after fasting for at least 8 hours.   BUN 01/12/2023 14  6 - 20 mg/dL Final   Creatinine, Ser 01/12/2023 1.23 (H)  0.44 - 1.00 mg/dL Final   Calcium 01/12/2023 10.0  8.9 - 10.3 mg/dL Final   GFR, Estimated 01/12/2023 51 (L)  >60 mL/min Final   Comment: (NOTE) Calculated using the CKD-EPI Creatinine Equation (2021)    Anion gap 01/12/2023 11  5 - 15 Final   Performed at [place] [address] [place], [place] [address]   WBC 01/12/2023 9.9  4.0 - 10.5 K/uL Final   RBC 01/12/2023 5.18 (H)  3.87 - 5.11 MIL/uL Final   Hemoglobin 01/12/2023 13.8  12.0 - 15.0 g/dL Final   HCT 01/12/2023 43.1  36.0 - 46.0 % Final   MCV 01/12/2023 83.2  80.0 - 100.0 fL Final   MCH 01/12/2023 26.6  26.0 - 34.0 pg Final   MCHC 01/12/2023 32.0  30.0 - 36.0 g/dL Final   RDW 01/12/2023 15.8 (H)  11.5 - 15.5 % Final   Platelets 01/12/2023 360  150 - 400 K/uL Final   nRBC 01/12/2023 0.0  0.0 - 0.2 % Final   Neutrophils Relative % 01/12/2023 62  % Final   Neutro Abs 01/12/2023 6.2  1.7 - 7.7 K/uL Final   Lymphocytes Relative 01/12/2023 27  % Final   Lymphs Abs 01/12/2023 2.7  0.7 - 4.0 K/uL Final   Monocytes Relative 01/12/2023 7  % Final   Monocytes Absolute 01/12/2023 0.7  0.1 - 1.0 K/uL Final    Eosinophils Relative 01/12/2023 3  % Final   Eosinophils Absolute 01/12/2023 0.3  0.0 - 0.5 K/uL Final   Basophils Relative 01/12/2023 1  % Final   Basophils Absolute 01/12/2023 0.1  0.0 - 0.1 K/uL Final   Immature Granulocytes 01/12/2023 0  % Final   Abs Immature Granulocytes 01/12/2023 0.03  0.00 - 0.07 K/uL Final   Performed at [facility], [place] [address] [place],  [address]   Magnesium 01/12/2023 2.1  1.7 - 2.4 mg/dL Final   Performed at [place] [address] [place] [address] [place], [place] [address]   Troponin I (High Sensitivity) 01/12/2023 9  <18 ng/L Final   Comment: (NOTE) Elevated high sensitivity troponin I (hsTnI) values and significant  changes across serial measurements may suggest ACS but  many other  chronic and acute conditions are known to elevate hsTnI results.  Refer to the "Links" section for chest pain algorithms and additional  guidance. Performed at [facility], [place] [address] [place], [place] [address]    Acetaminophen (Tylenol), Serum 01/12/2023 <10 (L)  10 - 30 ug/mL Final   Comment: (NOTE) Therapeutic concentrations vary significantly. A range of 10-30 ug/mL  may be an effective concentration for many patients. However, some  are best treated at concentrations outside of this range. Acetaminophen concentrations >150 ug/mL at 4 hours after ingestion  and >50 ug/mL at 12 hours after ingestion are often associated with  toxic reactions.  Performed at [facility], [place] [address], [place], [place] [id number]    Salicylate Lvl [id number] <7.0 (L)  7.0 - 30.0 mg/dL Final   Performed at [facility] [address], [place], [place] [address]   Opiates 01/12/2023 NONE DETECTED  NONE DETECTED Final   Cocaine 01/12/2023 POSITIVE (A)  NONE DETECTED Final   Benzodiazepines 01/12/2023 NONE DETECTED  NONE DETECTED Final   Amphetamines 01/12/2023 NONE DETECTED  NONE DETECTED Final   Tetrahydrocannabinol 01/12/2023 NONE DETECTED  NONE  DETECTED Final   Barbiturates 01/12/2023 NONE DETECTED  NONE DETECTED Final   Comment: (NOTE) DRUG SCREEN FOR MEDICAL PURPOSES ONLY.  IF CONFIRMATION IS NEEDED FOR ANY PURPOSE, NOTIFY LAB WITHIN 5 DAYS.  LOWEST DETECTABLE LIMITS FOR URINE DRUG SCREEN Drug Class                     Cutoff (ng/mL) Amphetamine and metabolites    1000 Barbiturate and metabolites    200 Benzodiazepine                 200 Opiates and metabolites        300 Cocaine and metabolites        300 THC                            50 Performed at [facility], [place] [address] [place], [place] [address]    Alcohol, Ethyl (B) 01/12/2023 <10  <10 mg/dL Final   Comment: (NOTE) Lowest detectable limit for serum alcohol is 10 mg/dL.  For medical purposes only. Performed at [facility], [place] [address] [place], [place] [address]    Troponin I (High Sensitivity) 01/12/2023 12  <18 ng/L Final   Comment: (NOTE) Elevated high sensitivity troponin I (hsTnI) values and significant  changes across serial measurements may suggest ACS but many other  chronic and acute conditions are known to elevate hsTnI results.  Refer to the "Links" section for chest pain algorithms and additional  guidance. Performed at [facility], [place] [address] [place], [place] [address]    I-stat hCG, quantitative 01/12/2023 <5.0  <5 mIU/mL Final   Comment 3 01/12/2023          Final   Comment:   GEST. AGE      CONC.  (mIU/mL)   <=1 WEEK        5 - 50     2 WEEKS       50 - 500     3 WEEKS       100 - 10,000     4 WEEKS     1,000 - 30,000        FEMALE AND NON-PREGNANT FEMALE:  LESS THAN 5 mIU/mL    SARS Coronavirus 2 by RT PCR 01/12/2023 NEGATIVE  NEGATIVE Final   Influenza A by PCR 01/12/2023 NEGATIVE  NEGATIVE Final   Influenza B by PCR 01/12/2023 NEGATIVE  NEGATIVE Final   Comment: (NOTE) The Xpert Xpress SARS-CoV-2/FLU/RSV plus assay is intended as an aid in the diagnosis of influenza from Nasopharyngeal swab  specimens and should not be used as a sole basis for treatment. Nasal washings and aspirates are unacceptable for Xpert Xpress SARS-CoV-2/FLU/RSV testing.  Fact Sheet for Patients: EntrepreneurPulse.com.au  Fact Sheet for Healthcare Providers: IncredibleEmployment.be  This test is not yet approved or cleared by the [place] FDA and has been authorized for detection and/or diagnosis of SARS-CoV-2 by FDA under an Emergency Use Authorization (EUA). This EUA will remain in effect (meaning this test can be used) for the duration of the COVID-19 declaration under Section 564(b)(1) of the Act, 21 U.S.C. section 360bbb-3(b)(1), unless the authorization is terminated or revoked.     Resp Syncytial Virus by PCR 01/12/2023 NEGATIVE  NEGATIVE Final   Comment: (NOTE) Fact Sheet for Patients: EntrepreneurPulse.com.au  Fact Sheet for Healthcare Providers: IncredibleEmployment.be  This test is not yet approved or cleared by the [place] FDA and has been authorized for detection and/or diagnosis of SARS-CoV-2 by FDA under an Emergency Use Authorization (EUA). This EUA will remain in effect (meaning this test can be used) for the duration of the COVID-19 declaration under Section 564(b)(1) of the Act, 21 U.S.C. section 360bbb-3(b)(1), unless the authorization is terminated or revoked.  Performed at [facility], [place] [address], [place], [place] [address]   Admission on 12/31/2022, Discharged on 01/01/2023  Component Date Value Ref Range Status   Sodium 12/31/2022 137  135 - 145 mmol/L Final   Potassium 12/31/2022 4.2  3.5 - 5.1 mmol/L Final   Chloride 12/31/2022 102  98 - 111 mmol/L Final   CO2 12/31/2022 23  22 - 32 mmol/L Final   Glucose, Bld 12/31/2022 95  70 - 99 mg/dL Final   Glucose reference range applies only to samples taken after fasting for at least 8 hours.   BUN 12/31/2022 14  6 - 20 mg/dL  Final   Creatinine, Ser 12/31/2022 0.95  0.44 - 1.00 mg/dL Final   Calcium 12/31/2022 9.7  8.9 - 10.3 mg/dL Final   Total Protein 12/31/2022 7.5  6.5 - 8.1 g/dL Final   Albumin 12/31/2022 3.8  3.5 - 5.0 g/dL Final   AST 12/31/2022 24  15 - 41 U/L Final   ALT 12/31/2022 17  0 - 44 U/L Final   Alkaline Phosphatase 12/31/2022 71  38 - 126 U/L Final   Total Bilirubin 12/31/2022 1.1  0.3 - 1.2 mg/dL Final   GFR, Estimated 12/31/2022 >60  >60 mL/min Final   Comment: (NOTE) Calculated using the CKD-EPI Creatinine Equation (2021)    Anion gap 12/31/2022 12  5 - 15 Final   Performed at [facility], [place] [address], [place], [place] [address]   WBC 12/31/2022 11.0 (H)  4.0 - 10.5 K/uL Final   RBC 12/31/2022 5.03  3.87 - 5.11 MIL/uL Final   Hemoglobin 12/31/2022 13.4  12.0 - 15.0 g/dL Final   HCT 12/31/2022 42.3  36.0 - 46.0 % Final   MCV 12/31/2022 84.1  80.0 - 100.0 fL Final   MCH 12/31/2022 26.6  26.0 - 34.0 pg Final   MCHC 12/31/2022 31.7  30.0 - 36.0 g/dL Final  RDW 12/31/2022 15.9 (H)  11.5 - 15.5 % Final   Platelets 12/31/2022 331  150 - 400 K/uL Final   nRBC 12/31/2022 0.0  0.0 - 0.2 % Final   Performed at [facility], [place] [address] [place], [place] [address]   Opiates 12/31/2022 NONE DETECTED  NONE DETECTED Final   Cocaine 12/31/2022 POSITIVE (A)  NONE DETECTED Final   Benzodiazepines 12/31/2022 NONE DETECTED  NONE DETECTED Final   Amphetamines 12/31/2022 NONE DETECTED  NONE DETECTED Final   Tetrahydrocannabinol 12/31/2022 POSITIVE (A)  NONE DETECTED Final   Barbiturates 12/31/2022 NONE DETECTED  NONE DETECTED Final   Comment: (NOTE) DRUG SCREEN FOR MEDICAL PURPOSES ONLY.  IF CONFIRMATION IS NEEDED FOR ANY PURPOSE, NOTIFY LAB WITHIN 5 DAYS.  LOWEST DETECTABLE LIMITS FOR URINE DRUG SCREEN Drug Class                     Cutoff (ng/mL) Amphetamine and metabolites    1000 Barbiturate and metabolites    200 Benzodiazepine                 200 Opiates and  metabolites        300 Cocaine and metabolites        300 THC                            50 Performed at [facility], [place] [address] [place], [place] [address]    I-stat hCG, quantitative 12/31/2022 <5.0  <5 mIU/mL Final   Comment 3 12/31/2022          Final   Comment:   GEST. AGE      CONC.  (mIU/mL)   <=1 WEEK        5 - 50     2 WEEKS       50 - 500     3 WEEKS       100 - 10,000     4 WEEKS     1,000 - 30,000        FEMALE AND NON-PREGNANT FEMALE:     LESS THAN 5 mIU/mL    SARS Coronavirus 2 by RT PCR 12/31/2022 NEGATIVE  NEGATIVE Final   Influenza A by PCR 12/31/2022 NEGATIVE  NEGATIVE Final   Influenza B by PCR 12/31/2022 NEGATIVE  NEGATIVE Final   Comment: (NOTE) The Xpert Xpress SARS-CoV-2/FLU/RSV plus assay is intended as an aid in the diagnosis of influenza from Nasopharyngeal swab specimens and should not be used as a sole basis for treatment. Nasal washings and aspirates are unacceptable for Xpert Xpress SARS-CoV-2/FLU/RSV testing.  Fact Sheet for Patients: EntrepreneurPulse.com.au  Fact Sheet for Healthcare Providers: IncredibleEmployment.be  This test is not yet approved or cleared by the [place] FDA and has been authorized for detection and/or diagnosis of SARS-CoV-2 by FDA under an Emergency Use Authorization (EUA). This EUA will remain in effect (meaning this test can be used) for the duration of the COVID-19 declaration under Section 564(b)(1) of the Act, 21 U.S.C. section 360bbb-3(b)(1), unless the authorization is terminated or revoked.     Resp Syncytial Virus by PCR 12/31/2022 NEGATIVE  NEGATIVE Final   Comment: (NOTE) Fact Sheet for Patients: EntrepreneurPulse.com.au  Fact Sheet for Healthcare Providers: IncredibleEmployment.be  This test is not yet approved or cleared by the [place] FDA and has been authorized for detection and/or diagnosis of SARS-CoV-2  by FDA under an Emergency  Use Authorization (EUA). This EUA will remain in effect (meaning this test can be used) for the duration of the COVID-19 declaration under Section 564(b)(1) of the Act, 21 U.S.C. section 360bbb-3(b)(1), unless the authorization is terminated or revoked.  Performed at [facility], [place] [address], [place], [place] [address]     Blood Alcohol level:  Lab Results  Component Value Date   [facility] <10 01/12/2023   ETH <10 [id number]    Metabolic Disorder Labs: Lab Results  Component Value Date   HGBA1C 5.7 (H) 09/03/2021   MPG 116.89 09/03/2021   MPG 116.89 01/13/2020   No results found for: "PROLACTIN" Lab Results  Component Value Date   CHOL 230 (H) 09/03/2021   TRIG 111 09/03/2021   HDL 62 09/03/2021   CHOLHDL 3.7 09/03/2021   VLDL 22 09/03/2021   LDLCALC 146 (H) 09/03/2021   LDLCALC 114 (H) 01/13/2020    Therapeutic Lab Levels: Lab Results  Component Value Date   LITHIUM 0.39 (L) 07/05/2016   No results found for: "VALPROATE" No results found for: "CBMZ"  Physical Findings   AIMS    Flowsheet Row Admission (Discharged) from 09/18/2021 in [place] 400B Admission (Discharged) from 09/02/2021 in [facility]B Admission (Discharged) from 01/12/2020 in [place] 300B Admission (Discharged) from 06/01/2019 in [place] 500B  AIMS Total Score 0 0 0 0      AUDIT    Flowsheet Row Admission (Discharged) from 09/18/2021 in [place] 400B Admission (Discharged) from 09/02/2021 in [place] 300B Admission (Discharged) from 01/12/2020 in [place] 300B Admission (Discharged) from 06/01/2019 in [facility] [place] 500B  Alcohol Use Disorder Identification Test Final Score (AUDIT) '16 14 4 '$ 0      PHQ2-9    Flowsheet  Row ED from 01/12/2023 in [facility]  PHQ-2 Total Score 1  PHQ-9 Total Score 3      Flowsheet Row ED from 01/12/2023 in [facility] Most recent reading at 01/12/2023  5:23 PM ED from 01/12/2023 in [facility] Department at [facility] Most recent reading at 01/12/2023  9:29 AM ED from 12/31/2022 in [facility] Emergency Department at [facility] Most recent reading at 12/31/2022  9:06 AM  C-SSRS RISK CATEGORY No Risk High Risk High Risk        Musculoskeletal  Strength & Muscle Tone: within normal limits Gait & Station: normal Patient leans: N/A  Psychiatric Specialty Exam  Presentation General Appearance: Appropriate for Environment  Eye Contact:Fair  Speech:Clear and Coherent  Speech Volume:Normal  Handedness:-- (not assessed)   Mood and Affect  Mood:Euthymic  Affect:Congruent   Thought Process  Thought Processes:Coherent; Linear  Descriptions of Associations:Intact  Orientation:Full (Time, Place and Person)  Thought Content:Logical    Hallucinations:Hallucinations: None  Ideas of Reference:None  Suicidal Thoughts:Suicidal Thoughts: No  Homicidal Thoughts:Homicidal Thoughts: No   Sensorium  Memory:Immediate Fair; Recent Fair; Remote Fair  Judgment:Fair  Insight:Fair   Executive Functions  Concentration:Fair  Attention Span:Fair  [place]   Psychomotor Activity  Psychomotor Activity:Psychomotor Activity: Normal   Assets  Assets:Communication Skills; Resilience   Sleep  Sleep:Sleep: Fair   Nutritional Assessment (For OBS and FBC admissions only) Has the patient had a weight loss or gain of 10 pounds or more in the last 3  months?: No Has the patient had a decrease in food intake/or appetite?: Yes Does the patient have dental problems?: No Does the patient have eating habits or behaviors that may be indicators of  an eating disorder including binging or inducing vomiting?: No Has the patient recently lost weight without trying?: 0 Has the patient been eating poorly because of a decreased appetite?: 0 Malnutrition Screening Tool Score: 0    Physical Exam Constitutional:      Appearance: the patient is not toxic-appearing.  Pulmonary:     Effort: Pulmonary effort is normal.  Neurological:     General: No focal deficit present.     Mental Status: the patient is alert and oriented to person, place, and time.   Review of Systems  Respiratory:  Negative for shortness of breath.   Cardiovascular:  Negative for chest pain.  Gastrointestinal:  Negative for abdominal pain, constipation, diarrhea, nausea and vomiting.  Neurological:  Negative for headaches.    BP 103/78   Pulse 100   Temp 98.5 F (36.9 C) (Oral)   Resp 18   SpO2 100%   Assessment and Plan:  Status: Voluntary, no recent self-harm behavior, patient can be discharged AMA should she request  Alcohol and cocaine use disorders - CIWA with as needed Ativan - Residential rehab placement  Bipolar 2 - Continue Latuda 60 mg daily  Medical: - UDS with cocaine, other labs unremarkable - Continue home Robaxin     Corky Sox, MD 01/14/2023 12:26 PM

## 2023-01-14 NOTE — ED Notes (Signed)
Pt received blood draw from R AC x1 stick without difficulty. Patient A&Ox4. Denies intent to harm self/others when asked. Denies A/VH. Patient c/o muscle spasms to L arm. Pt received Robaxin for sx management. Support and encouragement provided. Routine safety checks conducted according to facility protocol. Encouraged patient to notify staff if thoughts of harm toward self or others arise. Patient verbalize understanding and agreement. Will continue to monitor for safety.

## 2023-01-14 NOTE — ED Notes (Signed)
Pt approached nurses station requesting Robaxin and Vistaril for muscle spasms to L arm and increased anxiety. Cause unknown. No further complaints voiced. Prn medication provided. Will continue to monitor for safety.

## 2023-01-15 DIAGNOSIS — F1410 Cocaine abuse, uncomplicated: Secondary | ICD-10-CM | POA: Diagnosis not present

## 2023-01-15 LAB — CBC WITH DIFFERENTIAL/PLATELET
Abs Immature Granulocytes: 0.03 10*3/uL (ref 0.00–0.07)
Basophils Absolute: 0.1 10*3/uL (ref 0.0–0.1)
Basophils Relative: 1 %
Eosinophils Absolute: 0.4 10*3/uL (ref 0.0–0.5)
Eosinophils Relative: 4 %
HCT: 38.6 % (ref 36.0–46.0)
Hemoglobin: 12.6 g/dL (ref 12.0–15.0)
Immature Granulocytes: 0 %
Lymphocytes Relative: 41 %
Lymphs Abs: 3.8 10*3/uL (ref 0.7–4.0)
MCH: 26.9 pg (ref 26.0–34.0)
MCHC: 32.6 g/dL (ref 30.0–36.0)
MCV: 82.5 fL (ref 80.0–100.0)
Monocytes Absolute: 0.6 10*3/uL (ref 0.1–1.0)
Monocytes Relative: 7 %
Neutro Abs: 4.4 10*3/uL (ref 1.7–7.7)
Neutrophils Relative %: 47 %
Platelets: 346 10*3/uL (ref 150–400)
RBC: 4.68 MIL/uL (ref 3.87–5.11)
RDW: 15.6 % — ABNORMAL HIGH (ref 11.5–15.5)
WBC: 9.3 10*3/uL (ref 4.0–10.5)
nRBC: 0 % (ref 0.0–0.2)

## 2023-01-15 MED ORDER — MELATONIN 5 MG PO TABS: 5.0000 mg | ORAL_TABLET | Freq: Every evening | ORAL | 0 refills | Status: DC | PRN

## 2023-01-15 MED ORDER — NICOTINE POLACRILEX 2 MG MT GUM: 2.0000 mg | CHEWING_GUM | OROMUCOSAL | 0 refills | Status: DC | PRN

## 2023-01-15 MED ORDER — HYDROXYZINE HCL 25 MG PO TABS: 25.0000 mg | ORAL_TABLET | Freq: Three times a day (TID) | ORAL | Status: DC | PRN

## 2023-01-15 MED ORDER — ALBUTEROL SULFATE HFA 108 (90 BASE) MCG/ACT IN AERS
1.0000 | INHALATION_SPRAY | Freq: Four times a day (QID) | RESPIRATORY_TRACT | Status: DC | PRN
  Filled 2023-01-15: qty 6.7

## 2023-01-15 MED ORDER — HYDROXYZINE HCL 25 MG PO TABS: 25.0000 mg | ORAL_TABLET | Freq: Three times a day (TID) | ORAL | 0 refills | Status: DC | PRN

## 2023-01-15 MED ORDER — LURASIDONE HCL 60 MG PO TABS: 60.0000 mg | ORAL_TABLET | Freq: Every day | ORAL | 0 refills | Status: DC

## 2023-01-15 MED ORDER — HYDROXYZINE HCL 25 MG PO TABS
25.0000 mg | ORAL_TABLET | ORAL | Status: AC
  Administered 2023-01-15: 25 mg via ORAL

## 2023-01-15 MED ORDER — NYSTATIN 100000 UNIT/GM EX CREA: TOPICAL_CREAM | Freq: Two times a day (BID) | CUTANEOUS | 0 refills | Status: DC

## 2023-01-15 MED ORDER — PRAZOSIN HCL 1 MG PO CAPS
1.0000 mg | ORAL_CAPSULE | Freq: Every day | ORAL | Status: DC
  Administered 2023-01-15: 1 mg via ORAL
  Filled 2023-01-15: qty 1

## 2023-01-15 MED ORDER — NAPHAZOLINE-GLYCERIN 0.012-0.25 % OP SOLN: 1.0000 [drp] | Freq: Two times a day (BID) | OPHTHALMIC | 0 refills | Status: DC | PRN

## 2023-01-15 MED ORDER — METHOCARBAMOL 500 MG PO TABS: 500.0000 mg | ORAL_TABLET | Freq: Four times a day (QID) | ORAL | 0 refills | Status: DC | PRN

## 2023-01-15 MED ORDER — TRIAMCINOLONE ACETONIDE 0.1 % EX CREA: TOPICAL_CREAM | Freq: Two times a day (BID) | CUTANEOUS | 0 refills | Status: DC

## 2023-01-15 MED ORDER — ALBUTEROL SULFATE HFA 108 (90 BASE) MCG/ACT IN AERS: 1.0000 | INHALATION_SPRAY | Freq: Four times a day (QID) | RESPIRATORY_TRACT | 0 refills | Status: DC | PRN

## 2023-01-15 MED ORDER — ADULT MULTIVITAMIN W/MINERALS CH: 1.0000 | ORAL_TABLET | Freq: Every day | ORAL | 0 refills | Status: DC

## 2023-01-15 MED ORDER — AMLODIPINE BESYLATE 10 MG PO TABS: 10.0000 mg | ORAL_TABLET | Freq: Every morning | ORAL | 0 refills | Status: DC

## 2023-01-15 MED ORDER — PRAZOSIN HCL 1 MG PO CAPS: 1.0000 mg | ORAL_CAPSULE | Freq: Every day | ORAL | 0 refills | Status: DC

## 2023-01-15 MED ORDER — PANTOPRAZOLE SODIUM 40 MG PO TBEC: 40.0000 mg | DELAYED_RELEASE_TABLET | Freq: Every morning | ORAL | 0 refills | Status: DC

## 2023-01-15 MED ORDER — ALBUTEROL SULFATE HFA 108 (90 BASE) MCG/ACT IN AERS
1.0000 | INHALATION_SPRAY | Freq: Four times a day (QID) | RESPIRATORY_TRACT | Status: DC | PRN
  Administered 2023-01-15: 2 via RESPIRATORY_TRACT

## 2023-01-15 NOTE — Discharge Instructions (Signed)
[facility]: Address: [address], [place], [place] [address]; Phone: [phone]  List of Residential placements:   [facility]: [place]: [place], [place]: Female and female facility; 30-day program: (uninsured and Medicaid such as Deloria Lair, [place], [place], partners)  [place]: [phone]; men and women's facility; 28 days; Can have Medicaid tailored plan Publishing rights manager or Partners)  [place]: [phone] Janace Hoard or [facility]ani Hawking; 28 day program; must be fully detox; tailored Medicaid or no insurance  [facility] in [employer], [place]; [phone]; 28 day all males program; no insurance accepted  [facility] Referral in [place]: Wille Glaser [phone] (no insurance or Medicaid only); 90 days; outpatient services but provide housing in apartments [place] Admission: [phone]: Patient must complete phone screening for placement: [place], [place]; 6 month program; uninsured, Medicaid, and [employer].   [facility]: no insurance required; [place]: [phone]; Intake: Herbie Baltimore; Must fill out application online; Christy Sartorius Delay [phone] x Gray in [place], [place]: [phone]; Admissions Coordinators Mr. Simona Huh or Jene Every; 90 day program.  [facility]: [place], [place] [phone]; Co-Ed 9 month to a year program; Online application; Men entry fee is $500 (6-1month);  [employer] [address] [employer]  [phone] no fee or insurance required; minimum of 2 years; Highly structured; work based; Intake Coordinator is CGerald [phone] [facility] [place] [phone] Fax number is [phone] website: [email or web address].[email or web address]; Requires 3-6 page autobiography; 2 year program (18 months and then 63monthransitional housing); Admission fee is $300; no insurance needed; work  prFacilities managern SnParklandNC[place]Fr[place] Lake [place]: ReMichel [phone] have a Men's Regenerations Program 6-45m55monthFree program; There is an initial $300 fee however, they are willing to work with patients regarding that. Application is online.  [facility] [address]; Any 7-90 day program is out of pocket; 12 month program is free of charge; there is a $275 entry fee; Patient is responsible for own transportation   [facility]1[place]crestC,[phone] phone  New Patient Assessment/Therapy Walk-Ins:  Monday and Wednesday: 8 am until slots are full. Every 1st and 2nd Fridays of the month: 1 pm - 5 pm.  NO ASSESSMENT/THERAPY WALK-INS ON TUETaneytownew Patient Assessment/Medication Management Walk-Ins:  Monday - Friday:  8 am - 11 am.  For all walk-ins, we ask that you arrive by 7:30 am because patients will be seen in the order of arrival.  Availability is limited; therefore, you may not be seen on the same day that you walk-in.  Our goal is to serve and meet the needs of our community to the best of our ability.  SUBSTANCE USE TREATMENT for Medicaid and State Funded/IPRS  [facility]) [address],[phone] phone NOTE: [facility] is no longer offering IOP services.  Serves those who are low-income or have no insurance.  [facility] [address]igMount CalvaryC,[phone] [phone] NOTE: Does have Substance Abuse-Intensive Outpatient Program ([facility] well as transitional housing if eligible.  RHAChataignier1[place]chesC,[phone] phone [phone]  Day[place]ry [phone] HigPlattevilleC,[phone] phone [phone]  HALFWAY HOUSES:  [facility] of [phone] Bath & Beyondfordvacancies.com  12  STEP PROGRAMS:  Alcoholics Anonymous of [place] ReportZoo.com.cy  Narcotics Anonymous of [place] GreenScrapbooking.dk  Al-Anon of [employer], [place] www.g[place]alanon.org/find-meetings.html  Nar-Anon https://nar-anon.org/find-a-meetin

## 2023-01-15 NOTE — ED Notes (Signed)
Patient has been awake and alert on unit throughout the day.  She attended each group offered and participated well.  Patient is excited to have been accepted to [facility] treatment center and is scheduled to be discharged tomorrow to facility.  She appears motivated for treatment.  Patient makes needs known and is calm and cooperative.  No evidence of withdrawal.  Will monitor and continue to provide support.

## 2023-01-15 NOTE — Group Note (Signed)
Group Topic: Healthy Self Image and Positive Change  Group Date: 01/15/2023 Start Time: 1030 End Time: 1115 Facilitators: Jennye Moccasin  Department: [facility]  Number of Participants: 6  Group Focus: self-awareness Treatment Modality:  Psychoeducation Interventions utilized were group exercise Purpose: increase insight  Name: Denise Knight Date of Birth: 1966-02-06  MR: [id number]    Level of Participation: moderate Quality of Participation: attentive and cooperative Interactions with others: gave feedback Mood/Affect: positive Triggers (if applicable): na Cognition: coherent/clear Progress: Moderate Response: na Plan: follow-up needed  Patients Problems:  Patient Active Problem List   Diagnosis Date Noted   Suicidal ideations 12/31/2022   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   Asthma exacerbation, mild 09/20/2021   GERD (gastroesophageal reflux disease)    Primary hypertension    Polysubstance abuse ([place])    Bipolar II disorder, most recent episode major depressive ([place]) 09/04/2021   Vitamin D deficiency 09/03/2021   Cannabis dependence with current use ([place]) 09/02/2021   MDD (major depressive disorder) 01/12/2020   Severe recurrent major depression without psychotic features ([place]) 01/12/2020   Alcohol use disorder, moderate, dependence ([place]) 01/12/2020   Cocaine abuse ([place]) 12/27/2017   Rectal bleeding 12/27/2017   NSTEMI (non-ST elevated myocardial infarction) ([place]) 12/23/2017   Acute viral bronchitis 06/14/2017   Asthma exacerbation 06/14/2017   Chest pain 07/05/2016

## 2023-01-15 NOTE — ED Notes (Signed)
Pt is in the dayroom watching TV with peers. Pt denies SI/HI/AVH. No acute distress noted. Will continue to monitor for safety.

## 2023-01-15 NOTE — ED Notes (Signed)
Patient is presently awake and alert sitting in dayroom eating breakfast.  No evidence of withdrawal.  Patient able to make needs known to staff.  Will monitor and provide support as needed.

## 2023-01-15 NOTE — ED Notes (Signed)
Patient observed/assessed in room in bed appearing in no immediate distress resting peacefully. Q15 minute checks continued by MHT and nursing staff. Will continue to monitor and support. 

## 2023-01-15 NOTE — Discharge Planning (Signed)
LCSW followed up with [facility] [phone] regarding referral for patient. LCSW spoke with Admissions Coordinator Estill Bamberg who reports she would like for the patient to call to complete phone screening. Contact information was provided to patient for her follow up.   Patient completed phone screening with agency. LCSW followed up for updates. Per Estill Bamberg, patient has been accepted and can admit to their facility on tomorrow. [facility] will provide transportation on tomorrow and will follow up in the morning to provide transport time. 30 day script is needed for admission. No other needs to report at this time.   LCSW provided update to patient. Patient expressed appreciation for LCSW assistance with securing placement for her to work towards recovery. Brief supportive counseling was provided to the patient and the patient was receptive to the feedback provided. Patient reports her daughter is planning to bring her clothes on today. No other needs were reported at this time.   LCSW to provide update to MD and RN.   Lucius Conn, LCSW Clinical Social Worker [place] [facility] Ph: [phone]

## 2023-01-15 NOTE — ED Notes (Addendum)
Patient observed/assessed in room in bed appearing in no immediate distress resting peacefully. Q15 minute checks continued by MHT and nursing staff. Will continue to monitor and support. 

## 2023-01-15 NOTE — ED Provider Notes (Signed)
Behavioral Health Progress Note  Date and Time: 01/15/2023 9:33 AM Name: Denise Knight MRN:  [id number]  Subjective:   On 3/5, the patient reports nightmares and states that she has had previous success with prazosin.  Also discussed that she would like triamcinolone for eczema and Protonix for GERD.  Discussed that LCSW will look for residential rehab consistent with her insurance. Addendum: Informed about bright red blood in the patient's stool.  Will check CBC tonight.  On assessment the patient is in no acute distress, abdomen is soft, mildly tender to palpation in the left lower quadrant.  The patient reports having hemorrhoids previously but says she has not had blood in her stool in many years.  Discussed with nursing staff and the patient that we will need to monitor for blood in the stool going forward.  On assessment 3/6, the patient reports no bowel movements since her last discussion yesterday afternoon.  Discussed with her that she will need to monitor for any blood bowel movements and notify staff promptly.  Discussed with her that we can start prazosin tonight and that she should watch for lightheadedness upon standing.  Repeat labs for monitoring.   Information from initial evaluation: The patient is a 57 year old female with a history of medication overdoses and suicidal thoughts, resulting in many inpatient psychiatric admissions.  She was most recently admitted to the [facility] behavioral hospital in November 2022 where she was given the diagnosis of bipolar 2.  She came to the emergency department on February 20 of this year reporting suicidal thoughts and was admitted to [facility].  She states that she left the facility on Friday and binged on alcohol and cocaine.  She reports coming to the behavioral urgent care for help with her addiction issues.  She was admitted to the facility based crisis.  Social history is notable for the patient dropping out of high school in the ninth  grade and having a child.  This daughter, [name], number in the chart, is currently 59 years old and lives with the patient along with her 2 children ages 5 and 24.  The patient reports that her husband died 5 years ago.  She reports that she receives his pension which is $1600 per month.  The patient reports that she does very little during the day, watching TV and occasionally doing laundry.  She reports that she helps watch the children at home.  The patient reports that she binges on crack cocaine and alcohol periodically.  She states that she does this to "forget".  She endorses that she is trying to help with anxiety and depression.  She reports that her motivation to quit is "I am tired of living like this".  The patient denies ever experiencing withdrawal symptoms from alcohol.  She reports that her last drink was on Saturday.  She denies the use of any opioids.  The patient states that she is not regularly taking any psychotropic or other medication.  She desires to continue with her Latuda and her amlodipine.  Discussed with her that she cannot have Ativan given that she does not have a regular outpatient prescriber.  The patient expressed understanding and agreed.  The patient denies experiencing severe depression recently and states that she has not had suicidal thoughts over the past couple days.  She states that she is hopeful about getting better.   Diagnosis:  Final diagnoses:  Polysubstance abuse ([place])  Severe recurrent major depression without psychotic features ([place])  Total Time spent with patient: 20 minutes  Past Psychiatric History: as above Past Medical History: as above Family History: none Family Psychiatric  History: none Social History: as above and per H and P  Additional Social History:  See H and P                  Sleep: Fair  Appetite:  Fair   Current Medications:  Current Facility-Administered Medications  Medication Dose Route Frequency  Provider Last Rate Last Admin   acetaminophen (TYLENOL) tablet 650 mg  650 mg Oral Q6H PRN Revonda Humphrey, NP   650 mg at 01/13/23 0930   alum & mag hydroxide-simeth (MAALOX/MYLANTA) 200-200-20 MG/5ML suspension 30 mL  30 mL Oral Q4H PRN Revonda Humphrey, NP   30 mL at 01/14/23 2112   amLODipine (NORVASC) tablet 10 mg  10 mg Oral q morning Corky Sox, MD   10 mg at 01/15/23 [id number]   hydrOXYzine (ATARAX) tablet 25 mg  25 mg Oral Q6H PRN Revonda Humphrey, NP   25 mg at 01/15/23 [id number]   loperamide (IMODIUM) capsule 2-4 mg  2-4 mg Oral PRN Revonda Humphrey, NP       LORazepam (ATIVAN) tablet 1 mg  1 mg Oral Q6H PRN Revonda Humphrey, NP       Lurasidone HCl TABS 60 mg  60 mg Oral Q breakfast Revonda Humphrey, NP   60 mg at 01/15/23 [id number]   magnesium hydroxide (MILK OF MAGNESIA) suspension 30 mL  30 mL Oral Daily PRN Revonda Humphrey, NP       melatonin tablet 5 mg  5 mg Oral QHS PRN Onuoha, Chinwendu V, NP   5 mg at 01/14/23 2111   methocarbamol (ROBAXIN) tablet 500 mg  500 mg Oral QID PRN Revonda Humphrey, NP   500 mg at 01/15/23 0831   multivitamin with minerals tablet 1 tablet  1 tablet Oral Daily Revonda Humphrey, NP   1 tablet at 01/15/23 [id number]   naphazoline-glycerin (CLEAR EYES REDNESS) ophth solution 1-2 drop  1-2 drop Right Eye BID PRN Bobbitt, Hessie Diener E, NP   2 drop at 01/12/23 2256   nystatin cream (MYCOSTATIN)   Topical BID Hampton Abbot, MD   Given at 01/15/23 [phone]   And   triamcinolone cream (KENALOG) 0.1 % cream   Topical BID Hampton Abbot, MD   Given at 01/15/23 0916   ondansetron (ZOFRAN-ODT) disintegrating tablet 4 mg  4 mg Oral Q6H PRN Revonda Humphrey, NP       pantoprazole (PROTONIX) EC tablet 40 mg  40 mg Oral Daily Corky Sox, MD   40 mg at 01/15/23 [id number]   thiamine (VITAMIN B1) tablet 100 mg  100 mg Oral Daily Revonda Humphrey, NP   100 mg at 01/15/23 0909   Current Outpatient Medications  Medication Sig Dispense Refill   albuterol (VENTOLIN HFA)  108 (90 Base) MCG/ACT inhaler Inhale 1-2 puffs into the lungs every 6 (six) hours as needed for wheezing or shortness of breath. 1 each 0   amLODipine (NORVASC) 10 MG tablet Take 10 mg by mouth every morning. (Patient not taking: Reported on 01/18/2022)     bisacodyl (DULCOLAX) 5 MG EC tablet Take 2 tablets (10 mg total) by mouth daily as needed for moderate constipation. (Patient not taking: Reported on 01/14/2022) 30 tablet 0   fluticasone (FLOVENT HFA) 110 MCG/ACT inhaler Inhale 1 puff into the lungs 2 (two) times daily. (Patient not  taking: Reported on [id number]) 1 each 0   folic acid (FOLVITE) 1 MG tablet Take 1 tablet (1 mg total) by mouth daily. (Patient not taking: Reported on 01/14/2022) 30 tablet 0   gabapentin (NEURONTIN) 100 MG capsule Take 1 capsule (100 mg total) by mouth 3 (three) times daily. (Patient not taking: Reported on 01/18/2022) 90 capsule 0   guaiFENesin (ROBITUSSIN) 100 MG/5ML liquid Take 10 mLs by mouth every 4 (four) hours as needed for to loosen phlegm. (Patient not taking: Reported on 01/14/2022) 120 mL 0   hydrOXYzine (ATARAX/VISTARIL) 25 MG tablet Take 1 tablet (25 mg total) by mouth 3 (three) times daily as needed for anxiety. (Patient not taking: Reported on 01/14/2022) 30 tablet 0   hydrOXYzine (VISTARIL) 50 MG capsule Take 100 mg by mouth 2 (two) times daily.     lamoTRIgine (LAMICTAL) 25 MG tablet Take 1 tablet (25 mg total) by mouth daily. (Patient not taking: Reported on 01/14/2022) 30 tablet 0   loratadine (CLARITIN) 10 MG tablet Take 1 tablet (10 mg total) by mouth daily. (Patient not taking: Reported on 01/18/2022) 30 tablet 0   LORazepam (ATIVAN) 1 MG tablet Take 1 mg by mouth 4 (four) times daily as needed for anxiety.     Melatonin 10 MG TABS Take 10 mg by mouth at bedtime. 30 tablet 0   methocarbamol (ROBAXIN) 500 MG tablet Take 1,000 mg by mouth 2 (two) times daily.     mirtazapine (REMERON) 15 MG tablet Take 1 tablet (15 mg total) by mouth at bedtime. (Patient not  taking: Reported on 01/14/2022) 30 tablet 0   Multiple Vitamin (MULTIVITAMIN WITH MINERALS) TABS tablet Take 1 tablet by mouth daily. (Patient not taking: Reported on 01/14/2022) 30 tablet    mupirocin ointment (BACTROBAN) 2 % Place into the nose 2 (two) times daily. Apply in Nose 2 times daily until 09/27/21. (Patient not taking: Reported on 01/14/2022) 22 g 0   nicotine polacrilex (NICORETTE) 2 MG gum Take 1 each (2 mg total) by mouth as needed for smoking cessation. (Patient not taking: Reported on 01/14/2022) 100 tablet 0   pantoprazole (PROTONIX) 40 MG tablet Take 1 tablet (40 mg total) by mouth every morning. (Patient not taking: Reported on 01/18/2022) 30 tablet 0   PARoxetine (PAXIL) 20 MG tablet Take 20 mg by mouth daily.     prazosin (MINIPRESS) 1 MG capsule Take 1 mg by mouth 2 (two) times daily.     Pseudoephedrine-DM-GG (SUDAFED COUGH PO) Take 1 capsule by mouth daily as needed (Allergies). (Patient not taking: Reported on 01/18/2022)     QUEtiapine Fumarate 150 MG TABS Take 150 mg by mouth at bedtime. (Patient not taking: Reported on 01/14/2022) 30 tablet 0   simvastatin (ZOCOR) 40 MG tablet Take 1 tablet (40 mg total) by mouth daily with supper. (Patient not taking: Reported on 01/14/2022) 30 tablet 0   sodium chloride (OCEAN) 0.65 % SOLN nasal spray Place 1 spray into both nostrils as needed for congestion. (Patient not taking: Reported on 01/14/2022) 15 mL 0   thiamine 100 MG tablet Take 1 tablet (100 mg total) by mouth daily. (Patient not taking: Reported on 01/14/2022) 30 tablet 0   vitamin B-12 (CYANOCOBALAMIN) 1000 MCG tablet Take 1 tablet (1,000 mcg total) by mouth daily. (Patient not taking: Reported on 01/14/2022) 30 tablet 0   Vitamin D, Ergocalciferol, (DRISDOL) 1.25 MG (50000 UNIT) CAPS capsule Take 1 capsule (50,000 Units total) by mouth every 7 (seven) days. (Patient not taking: Reported  on 01/14/2022) 5 capsule 0    Labs  Lab Results:  Admission on 01/12/2023  Component Date Value Ref  Range Status   Sodium 01/14/2023 138  135 - 145 mmol/L Final   Potassium 01/14/2023 4.0  3.5 - 5.1 mmol/L Final   Chloride 01/14/2023 107  98 - 111 mmol/L Final   CO2 01/14/2023 21 (L)  22 - 32 mmol/L Final   Glucose, Bld 01/14/2023 103 (H)  70 - 99 mg/dL Final   Glucose reference range applies only to samples taken after fasting for at least 8 hours.   BUN 01/14/2023 19  6 - 20 mg/dL Final   Creatinine, Ser 01/14/2023 1.20 (H)  0.44 - 1.00 mg/dL Final   Calcium 01/14/2023 9.8  8.9 - 10.3 mg/dL Final   GFR, Estimated 01/14/2023 52 (L)  >60 mL/min Final   Comment: (NOTE) Calculated using the CKD-EPI Creatinine Equation (2021)    Anion gap 01/14/2023 10  5 - 15 Final   Performed at [facility], [place] [address], [place], [place] [address]   WBC 01/14/2023 9.7  4.0 - 10.5 K/uL Final   RBC 01/14/2023 4.76  3.87 - 5.11 MIL/uL Final   Hemoglobin 01/14/2023 13.1  12.0 - 15.0 g/dL Final   HCT 01/14/2023 39.4  36.0 - 46.0 % Final   MCV 01/14/2023 82.8  80.0 - 100.0 fL Final   MCH 01/14/2023 27.5  26.0 - 34.0 pg Final   MCHC 01/14/2023 33.2  30.0 - 36.0 g/dL Final   RDW 01/14/2023 15.8 (H)  11.5 - 15.5 % Final   Platelets 01/14/2023 350  150 - 400 K/uL Final   nRBC 01/14/2023 0.0  0.0 - 0.2 % Final   Neutrophils Relative % 01/14/2023 50  % Final   Neutro Abs 01/14/2023 4.9  1.7 - 7.7 K/uL Final   Lymphocytes Relative 01/14/2023 38  % Final   Lymphs Abs 01/14/2023 3.7  0.7 - 4.0 K/uL Final   Monocytes Relative 01/14/2023 7  % Final   Monocytes Absolute 01/14/2023 0.7  0.1 - 1.0 K/uL Final   Eosinophils Relative 01/14/2023 4  % Final   Eosinophils Absolute 01/14/2023 0.4  0.0 - 0.5 K/uL Final   Basophils Relative 01/14/2023 1  % Final   Basophils Absolute 01/14/2023 0.1  0.0 - 0.1 K/uL Final   Immature Granulocytes 01/14/2023 0  % Final   Abs Immature Granulocytes 01/14/2023 0.04  0.00 - 0.07 K/uL Final   Performed at [facility], [place] [address], [place], [place] [address]   Admission on 01/12/2023, Discharged on 01/12/2023  Component Date Value Ref Range Status   Sodium 01/12/2023 136  135 - 145 mmol/L Final   Potassium 01/12/2023 4.2  3.5 - 5.1 mmol/L Final   Chloride 01/12/2023 103  98 - 111 mmol/L Final   CO2 01/12/2023 22  22 - 32 mmol/L Final   Glucose, Bld 01/12/2023 103 (H)  70 - 99 mg/dL Final   Glucose reference range applies only to samples taken after fasting for at least 8 hours.   BUN 01/12/2023 14  6 - 20 mg/dL Final   Creatinine, Ser 01/12/2023 1.23 (H)  0.44 - 1.00 mg/dL Final   Calcium 01/12/2023 10.0  8.9 - 10.3 mg/dL Final   GFR, Estimated 01/12/2023 51 (L)  >60 mL/min Final   Comment: (NOTE) Calculated using the CKD-EPI Creatinine Equation (2021)    Anion gap 01/12/2023 11  5 - 15 Final   Performed at [facility], [place] [address] [place], [place] [address]   WBC 01/12/2023 9.9  4.0 - 10.5 K/uL Final   RBC 01/12/2023 5.18 (H)  3.87 - 5.11 MIL/uL Final   Hemoglobin 01/12/2023 13.8  12.0 - 15.0 g/dL Final   HCT 01/12/2023 43.1  36.0 - 46.0 % Final   MCV 01/12/2023 83.2  80.0 - 100.0 fL Final   MCH 01/12/2023 26.6  26.0 - 34.0 pg Final   MCHC 01/12/2023 32.0  30.0 - 36.0 g/dL Final   RDW 01/12/2023 15.8 (H)  11.5 - 15.5 % Final   Platelets 01/12/2023 360  150 - 400 K/uL Final   nRBC 01/12/2023 0.0  0.0 - 0.2 % Final   Neutrophils Relative % 01/12/2023 62  % Final   Neutro Abs 01/12/2023 6.2  1.7 - 7.7 K/uL Final   Lymphocytes Relative 01/12/2023 27  % Final   Lymphs Abs 01/12/2023 2.7  0.7 - 4.0 K/uL Final   Monocytes Relative 01/12/2023 7  % Final   Monocytes Absolute 01/12/2023 0.7  0.1 - 1.0 K/uL Final   Eosinophils Relative 01/12/2023 3  % Final   Eosinophils Absolute 01/12/2023 0.3  0.0 - 0.5 K/uL Final   Basophils Relative 01/12/2023 1  % Final   Basophils Absolute 01/12/2023 0.1  0.0 - 0.1 K/uL Final   Immature Granulocytes 01/12/2023 0  % Final   Abs Immature Granulocytes 01/12/2023 0.03  0.00 - 0.07 K/uL Final    Performed at [facility], [place] [address], [place], [place] [address]   Magnesium 01/12/2023 2.1  1.7 - 2.4 mg/dL Final   Performed at [place] [address], [place], [place] [address]   Troponin I (High Sensitivity) 01/12/2023 9  <18 ng/L Final   Comment: (NOTE) Elevated high sensitivity troponin I (hsTnI) values and significant  changes across serial measurements may suggest ACS but many other  chronic and acute conditions are known to elevate hsTnI results.  Refer to the "Links" section for chest pain algorithms and additional  guidance. Performed at [facility], [place] [address], [place], [place] [address]    Acetaminophen (Tylenol), Serum 01/12/2023 <10 (L)  10 - 30 ug/mL Final   Comment: (NOTE) Therapeutic concentrations vary significantly. A range of 10-30 ug/mL  may be an effective concentration for many patients. However, some  are best treated at concentrations outside of this range. Acetaminophen concentrations >150 ug/mL at 4 hours after ingestion  and >50 ug/mL at 12 hours after ingestion are often associated with  toxic reactions.  Performed at [facility], [place] [address] [place], [place] [id number]    Salicylate Lvl [id number] <7.0 (L)  7.0 - 30.0 mg/dL Final   Performed at [place] [address] [place], [place] [address]   Opiates 01/12/2023 NONE DETECTED  NONE DETECTED Final   Cocaine 01/12/2023 POSITIVE (A)  NONE DETECTED Final   Benzodiazepines 01/12/2023 NONE DETECTED  NONE DETECTED Final   Amphetamines 01/12/2023 NONE DETECTED  NONE DETECTED Final   Tetrahydrocannabinol 01/12/2023 NONE DETECTED  NONE DETECTED Final   Barbiturates 01/12/2023 NONE DETECTED  NONE DETECTED Final   Comment: (NOTE) DRUG SCREEN FOR MEDICAL PURPOSES ONLY.  IF CONFIRMATION IS NEEDED FOR ANY PURPOSE, NOTIFY LAB WITHIN 5 DAYS.  LOWEST DETECTABLE LIMITS FOR URINE DRUG SCREEN Drug Class                     Cutoff (ng/mL) Amphetamine and  metabolites    1000 Barbiturate and metabolites    200 Benzodiazepine                 200 Opiates and metabolites        300 Cocaine and metabolites        300 THC                            50 Performed at [facility], [place] [address] [place], [place] [address]    Alcohol, Ethyl (B) 01/12/2023 <10  <10 mg/dL Final   Comment: (NOTE) Lowest detectable limit for serum alcohol is 10 mg/dL.  For medical purposes only. Performed at [facility], [place] [address] [place], [place] [address]    Troponin I (High Sensitivity) 01/12/2023 12  <18 ng/L Final   Comment: (NOTE) Elevated high sensitivity troponin I (hsTnI) values and significant  changes across serial measurements may suggest ACS but many other  chronic and acute conditions are known to elevate hsTnI results.  Refer to the "Links" section for chest pain algorithms and additional  guidance. Performed at [facility], [place] [address] [place], [place] [address]    I-stat hCG, quantitative 01/12/2023 <5.0  <5 mIU/mL Final   Comment 3 01/12/2023          Final   Comment:   GEST. AGE      CONC.  (mIU/mL)   <=1 WEEK        5 - 50     2 WEEKS       50 - 500     3 WEEKS       100 - 10,000     4 WEEKS     1,000 - 30,000        FEMALE AND NON-PREGNANT FEMALE:     LESS THAN 5 mIU/mL    SARS Coronavirus 2 by RT PCR 01/12/2023 NEGATIVE  NEGATIVE Final   Influenza A by PCR 01/12/2023 NEGATIVE  NEGATIVE Final   Influenza B by PCR 01/12/2023 NEGATIVE  NEGATIVE Final   Comment: (NOTE) The Xpert Xpress SARS-CoV-2/FLU/RSV plus assay is intended as an aid in the diagnosis of influenza from Nasopharyngeal swab specimens and should not be used as a sole basis for treatment. Nasal washings and aspirates are unacceptable for Xpert Xpress SARS-CoV-2/FLU/RSV testing.  Fact Sheet for Patients: EntrepreneurPulse.com.au  Fact Sheet for Healthcare Providers: IncredibleEmployment.be  This test  is not yet approved or cleared by the [place] FDA and has been authorized for detection and/or diagnosis of SARS-CoV-2 by FDA under an Emergency Use Authorization (EUA). This EUA will remain in effect (meaning this test can be used) for the duration of the COVID-19 declaration under Section 564(b)(1) of the Act, 21 U.S.C. section 360bbb-3(b)(1), unless the authorization is terminated or revoked.     Resp Syncytial Virus by PCR 01/12/2023 NEGATIVE  NEGATIVE Final   Comment: (NOTE) Fact Sheet for Patients: EntrepreneurPulse.com.au  Fact Sheet for Healthcare Providers: IncredibleEmployment.be  This test is not yet approved or cleared by the [place] FDA and has been authorized for detection and/or diagnosis of SARS-CoV-2 by FDA under an Emergency Use Authorization (EUA). This EUA will remain in effect (meaning this test can be used) for the duration of the COVID-19 declaration under Section 564(b)(1) of the Act, 21 U.S.C. section 360bbb-3(b)(1), unless the authorization is terminated or revoked.  Performed at [facility], [address] [place], [place] [address]   Admission on 12/31/2022, Discharged on 01/01/2023  Component Date Value Ref Range Status   Sodium 12/31/2022 137  135 - 145 mmol/L Final   Potassium 12/31/2022 4.2  3.5 - 5.1 mmol/L Final   Chloride 12/31/2022 102  98 - 111 mmol/L Final   CO2 12/31/2022 23  22 - 32 mmol/L Final   Glucose, Bld 12/31/2022 95  70 - 99 mg/dL Final   Glucose reference range applies only to samples taken after fasting for at least 8 hours.   BUN 12/31/2022 14  6 - 20 mg/dL Final   Creatinine, Ser 12/31/2022 0.95  0.44 - 1.00 mg/dL Final   Calcium 12/31/2022 9.7  8.9 - 10.3 mg/dL Final   Total Protein 12/31/2022 7.5  6.5 - 8.1 g/dL Final   Albumin 12/31/2022 3.8  3.5 - 5.0 g/dL Final   AST 12/31/2022 24  15 - 41 U/L Final   ALT 12/31/2022 17  0 - 44 U/L Final   Alkaline Phosphatase  12/31/2022 71  38 - 126 U/L Final   Total Bilirubin 12/31/2022 1.1  0.3 - 1.2 mg/dL Final   GFR, Estimated 12/31/2022 >60  >60 mL/min Final   Comment: (NOTE) Calculated using the CKD-EPI Creatinine Equation (2021)    Anion gap 12/31/2022 12  5 - 15 Final   Performed at [facility], [place] [address], [place], [place] [address]   WBC 12/31/2022 11.0 (H)  4.0 - 10.5 K/uL Final   RBC 12/31/2022 5.03  3.87 - 5.11 MIL/uL Final   Hemoglobin 12/31/2022 13.4  12.0 - 15.0 g/dL Final   HCT 12/31/2022 42.3  36.0 - 46.0 % Final   MCV 12/31/2022 84.1  80.0 - 100.0 fL Final   MCH 12/31/2022 26.6  26.0 - 34.0 pg Final   MCHC 12/31/2022 31.7  30.0 - 36.0 g/dL Final   RDW 12/31/2022 15.9 (H)  11.5 - 15.5 % Final   Platelets 12/31/2022 331  150 - 400 K/uL Final   nRBC 12/31/2022 0.0  0.0 - 0.2 % Final   Performed at [facility], [place] [address], [place], [place] [address]   Opiates 12/31/2022 NONE DETECTED  NONE DETECTED Final   Cocaine 12/31/2022 POSITIVE (A)  NONE DETECTED Final   Benzodiazepines 12/31/2022 NONE DETECTED  NONE DETECTED Final   Amphetamines 12/31/2022 NONE DETECTED  NONE DETECTED Final   Tetrahydrocannabinol 12/31/2022 POSITIVE (A)  NONE DETECTED Final   Barbiturates 12/31/2022 NONE DETECTED  NONE DETECTED Final   Comment: (NOTE) DRUG SCREEN FOR MEDICAL PURPOSES ONLY.  IF CONFIRMATION IS NEEDED FOR ANY PURPOSE, NOTIFY LAB WITHIN 5 DAYS.  LOWEST DETECTABLE LIMITS FOR URINE DRUG SCREEN Drug Class                     Cutoff (ng/mL) Amphetamine and metabolites    1000 Barbiturate and metabolites    200 Benzodiazepine                 200 Opiates and metabolites        300 Cocaine and metabolites        300 THC                            50 Performed at [facility], [place] [address] [place], [place] [address]    I-stat hCG, quantitative 12/31/2022 <5.0  <5 mIU/mL Final   Comment 3  12/31/2022          Final   Comment:   GEST. AGE      CONC.  (mIU/mL)   <=1 WEEK         5 - 50     2 WEEKS       50 - 500     3 WEEKS       100 - 10,000     4 WEEKS     1,000 - 30,000        FEMALE AND NON-PREGNANT FEMALE:     LESS THAN 5 mIU/mL    SARS Coronavirus 2 by RT PCR 12/31/2022 NEGATIVE  NEGATIVE Final   Influenza A by PCR 12/31/2022 NEGATIVE  NEGATIVE Final   Influenza B by PCR 12/31/2022 NEGATIVE  NEGATIVE Final   Comment: (NOTE) The Xpert Xpress SARS-CoV-2/FLU/RSV plus assay is intended as an aid in the diagnosis of influenza from Nasopharyngeal swab specimens and should not be used as a sole basis for treatment. Nasal washings and aspirates are unacceptable for Xpert Xpress SARS-CoV-2/FLU/RSV testing.  Fact Sheet for Patients: EntrepreneurPulse.com.au  Fact Sheet for Healthcare Providers: IncredibleEmployment.be  This test is not yet approved or cleared by the [place] FDA and has been authorized for detection and/or diagnosis of SARS-CoV-2 by FDA under an Emergency Use Authorization (EUA). This EUA will remain in effect (meaning this test can be used) for the duration of the COVID-19 declaration under Section 564(b)(1) of the Act, 21 U.S.C. section 360bbb-3(b)(1), unless the authorization is terminated or revoked.     Resp Syncytial Virus by PCR 12/31/2022 NEGATIVE  NEGATIVE Final   Comment: (NOTE) Fact Sheet for Patients: EntrepreneurPulse.com.au  Fact Sheet for Healthcare Providers: IncredibleEmployment.be  This test is not yet approved or cleared by the [place] FDA and has been authorized for detection and/or diagnosis of SARS-CoV-2 by FDA under an Emergency Use Authorization (EUA). This EUA will remain in effect (meaning this test can be used) for the duration of the COVID-19 declaration under Section 564(b)(1) of the Act, 21 U.S.C. section 360bbb-3(b)(1), unless the authorization is terminated or revoked.  Performed at [facility], [place]  [address], [place], [place] [address]     Blood Alcohol level:  Lab Results  Component Value Date   [facility] <10 01/12/2023   ETH <10 [id number]    Metabolic Disorder Labs: Lab Results  Component Value Date   HGBA1C 5.7 (H) 09/03/2021   MPG 116.89 09/03/2021   MPG 116.89 01/13/2020   No results found for: "PROLACTIN" Lab Results  Component Value Date   CHOL 230 (H) 09/03/2021   TRIG 111 09/03/2021   HDL 62 09/03/2021   CHOLHDL 3.7 09/03/2021   VLDL 22 09/03/2021   LDLCALC 146 (H) 09/03/2021   LDLCALC 114 (H) 01/13/2020    Therapeutic Lab Levels: Lab Results  Component Value Date   LITHIUM 0.39 (L) 07/05/2016   No results found for: "VALPROATE" No results found for: "CBMZ"  Physical Findings   AIMS    Flowsheet Row Admission (Discharged) from 09/18/2021 in [place] [facility] Admission (Discharged) from 09/02/2021 in [place] [facility] Admission (Discharged) from 01/12/2020 in [place] [facility] Admission (Discharged) from 06/01/2019 in [place] [facility]  AIMS Total Score 0 0 0 0      AUDIT    Flowsheet Row Admission (Discharged) from 09/18/2021 in [place] [facility] Admission (Discharged) from  09/02/2021 in [place] [facility] Admission (Discharged) from 01/12/2020 in [place] [facility] Admission (Discharged) from 06/01/2019 in [place] [facility]  Alcohol Use Disorder Identification Test Final Score (AUDIT) '16 14 4 '$ 0      PHQ2-9    Flowsheet Row ED from 01/12/2023 in [facility]  PHQ-2 Total Score 0  PHQ-9 Total Score 0      Flowsheet Row ED from 01/12/2023 in [facility] Most recent reading at 01/12/2023  5:23 PM ED from 01/12/2023 in [facility] Emergency Department at [facility] Most recent reading at  01/12/2023  9:29 AM ED from 12/31/2022 in [facility] Emergency Department at [facility] Most recent reading at 12/31/2022  9:06 AM  C-SSRS RISK CATEGORY No Risk High Risk High Risk        Musculoskeletal  Strength & Muscle Tone: within normal limits Gait & Station: normal Patient leans: N/A  Psychiatric Specialty Exam  Presentation General Appearance: Appropriate for Environment  Eye Contact:Fair  Speech:Clear and Coherent  Speech Volume:Normal  Handedness:-- (not assessed)   Mood and Affect  Mood:Euthymic  Affect:Congruent   Thought Process  Thought Processes:Coherent; Linear  Descriptions of Associations:Intact  Orientation:Full (Time, Place and Person)  Thought Content:Logical    Hallucinations:Hallucinations: None  Ideas of Reference:None  Suicidal Thoughts:Suicidal Thoughts: No  Homicidal Thoughts:Homicidal Thoughts: No   Sensorium  Memory:Immediate Fair; Recent Fair; Remote Fair  Judgment:Fair  Insight:Fair   Executive Functions  Concentration:Fair  Attention Span:Fair  [place]   Psychomotor Activity  Psychomotor Activity:Psychomotor Activity: Normal   Assets  Assets:Communication Skills; Resilience   Sleep  Sleep:Sleep: Fair   Nutritional Assessment (For OBS and FBC admissions only) Has the patient had a weight loss or gain of 10 pounds or more in the last 3 months?: No Has the patient had a decrease in food intake/or appetite?: Yes Does the patient have dental problems?: No Does the patient have eating habits or behaviors that may be indicators of an eating disorder including binging or inducing vomiting?: No Has the patient recently lost weight without trying?: 0 Has the patient been eating poorly because of a decreased appetite?: 0 Malnutrition Screening Tool Score: 0    Physical Exam Constitutional:      Appearance: the patient is not toxic-appearing.  Pulmonary:      Effort: Pulmonary effort is normal.  Neurological:     General: No focal deficit present.     Mental Status: the patient is alert and oriented to person, place, and time.   Review of Systems  Respiratory:  Negative for shortness of breath.   Cardiovascular:  Negative for chest pain.  Gastrointestinal:  Negative for abdominal pain, constipation, diarrhea, nausea and vomiting.  Neurological:  Negative for headaches.    BP (!) 126/95 (BP Location: Right Arm)   Pulse 80   Temp 98.1 F (36.7 C) (Oral)   Resp 18   SpO2 100%   Assessment and Plan:  Status: Voluntary, no recent self-harm behavior, patient can be discharged AMA should she request  Alcohol and cocaine use disorders - CIWA with as needed Ativan - Residential rehab placement  Bipolar 2 - Continue Latuda 60 mg daily  Nightmares, PTSD - Prazosin 1 mg nightly  Medical: - UDS with cocaine, other labs unremarkable - Continue home Robaxin     Corky Sox, MD 01/15/2023 9:33 AM

## 2023-01-15 NOTE — ED Notes (Signed)
Pt filled out paper to talk about goals for wrap up group but she went to lay down an said she couldn't make it.

## 2023-01-15 NOTE — ED Notes (Addendum)
Pt came to nurse and requested for hydroxyzine for anxiety.

## 2023-01-15 NOTE — ED Provider Notes (Signed)
Patient accepted to [facility] treatment center.  Prescription printed.  Will be discharged and picked up some time from 10 AM to 2 PM tomorrow.  Corky Sox, MD PGY-2

## 2023-01-16 DIAGNOSIS — F1410 Cocaine abuse, uncomplicated: Secondary | ICD-10-CM | POA: Diagnosis not present

## 2023-01-16 NOTE — ED Notes (Signed)
"  Pt denies SI, plan, and intention.  Suicide safety plan completed, reviewed with this RN, given to the patient, and a copy in the chart."

## 2023-01-16 NOTE — ED Notes (Signed)
Patient A&O x 4, ambulatory. Patient discharged in no acute distress. Patient denied SI/HI, A/VH upon discharge. Patient verbalized understanding of all discharge instructions explained by staff, to include follow up appointments, RX's and safety plan. Patient reported mood 10/10.  Pt belongings returned to patient from locker # 3 & 4  intact. Patient escorted to lobby via staff for transport to destination. Safety maintained.

## 2023-01-16 NOTE — ED Notes (Signed)
Patient is sleeping. Respirations equal and unlabored, skin warm and dry, NAD. No change in assessment or acuity. Routine safety checks conducted according to facility protocol. Will continue to monitor for safety.

## 2023-01-16 NOTE — ED Notes (Signed)
Accepted scheduled meds w/o difficulty. Out of room to day room, hallway and med room. Converses with staff and other patients on the unit. Shower this morning. Safety maintained and will continue to monitor.

## 2023-01-16 NOTE — ED Notes (Signed)
Pt was found vomiting and said, that's due to acid reflux. Writer asked if pt needed medication for that and pt stated no, but instead she will need to take  Protonix early in the morning on empty stomach.writer asked pt if she needed any other intervention, pt stated no. Writer let pt know,they should Biochemist, clinical know when pt need med or any other intervention.

## 2023-01-16 NOTE — ED Provider Notes (Signed)
FBC/OBS ASAP Discharge Summary  Date and Time: 01/16/2023 9:06 AM  Name: Denise Knight  MRN:  [id number]   Discharge Diagnoses:  Final diagnoses:  Cocaine abuse ([place])    Subjective from initial evaluation:  The patient is a 57 year old female with a history of medication overdoses and suicidal thoughts, resulting in many inpatient psychiatric admissions.  She was most recently admitted to the [facility] behavioral hospital in November 2022 where she was given the diagnosis of bipolar 2.  She came to the emergency department on February 20 of this year reporting suicidal thoughts and was admitted to [facility].  She states that she left the facility on Friday and binged on alcohol and cocaine.  She reports coming to the behavioral urgent care for help with her addiction issues.  She was admitted to the facility based crisis.   Social history is notable for the patient dropping out of high school in the ninth grade and having a child.  This daughter, Denise Knight, number in the chart, is currently 59 years old and lives with the patient along with her 2 children ages 68 and 32.  The patient reports that her husband died 5 years ago.  She reports that she receives his pension which is $1600 per month.  The patient reports that she does very little during the day, watching TV and occasionally doing laundry.  She reports that she helps watch the children at home.   The patient reports that she binges on crack cocaine and alcohol periodically.  She states that she does this to "forget".  She endorses that she is trying to help with anxiety and depression.  She reports that her motivation to quit is "I am tired of living like this".  The patient denies ever experiencing withdrawal symptoms from alcohol.  She reports that her last drink was on Saturday.  She denies the use of any opioids.   The patient states that she is not regularly taking any psychotropic or other medication.  She desires to continue with her  Latuda and her amlodipine.  Discussed with her that she cannot have Ativan given that she does not have a regular outpatient prescriber.  The patient expressed understanding and agreed.   The patient denies experiencing severe depression recently and states that she has not had suicidal thoughts over the past couple days.  She states that she is hopeful about getting better.  Stay Summary:  Uneventful detox.  Patient found placement at [facility] treatment center.    The patient did have blood in her stool on multiple occasions.  Serial CBCs were checked that were stable with a hemoglobin around 13 each time.  The patient reports a history of hemorrhoids previously.  Discussed with her that she will need to follow-up with a primary care doctor to monitor the blood in her stool.  She expressed understanding.  The patient denies auditory/visual hallucinations.  The patient reports good mood, appetite, and sleep. They deny suicidal and homicidal thoughts. The patient denies side effects from their medications.  Review of systems as below. The patient denies experiencing any withdrawal symptoms.    Total Time spent with patient: 20 minutes  Past Psychiatric History: as above Past Medical History: as above Family History: none Family Psychiatric History: none Social History: as above Tobacco Cessation:  A prescription for an FDA-approved tobacco cessation medication provided at discharge   Current Medications:  Current Facility-Administered Medications  Medication Dose Route Frequency Provider Last Rate Last Admin   acetaminophen (  TYLENOL) tablet 650 mg  650 mg Oral Q6H PRN Revonda Humphrey, NP   650 mg at 01/13/23 0930   albuterol (VENTOLIN HFA) 108 (90 Base) MCG/ACT inhaler 1-2 puff  1-2 puff Inhalation Q6H PRN Onuoha, Chinwendu V, NP       albuterol (VENTOLIN HFA) 108 (90 Base) MCG/ACT inhaler 1-2 puff  1-2 puff Inhalation Q6H PRN Evette Georges, NP   2 puff at 01/15/23 2136   alum & mag  hydroxide-simeth (MAALOX/MYLANTA) 200-200-20 MG/5ML suspension 30 mL  30 mL Oral Q4H PRN Revonda Humphrey, NP   30 mL at 01/14/23 2112   amLODipine (NORVASC) tablet 10 mg  10 mg Oral q morning Corky Sox, MD   10 mg at 01/15/23 [id number]   hydrOXYzine (ATARAX) tablet 25 mg  25 mg Oral TID PRN Onuoha, Chinwendu V, NP       Lurasidone HCl TABS 60 mg  60 mg Oral Q breakfast Revonda Humphrey, NP   60 mg at 01/15/23 [id number]   magnesium hydroxide (MILK OF MAGNESIA) suspension 30 mL  30 mL Oral Daily PRN Revonda Humphrey, NP       melatonin tablet 5 mg  5 mg Oral QHS PRN Onuoha, Chinwendu V, NP   5 mg at 01/15/23 2106   methocarbamol (ROBAXIN) tablet 500 mg  500 mg Oral QID PRN Revonda Humphrey, NP   500 mg at 01/15/23 1737   multivitamin with minerals tablet 1 tablet  1 tablet Oral Daily Revonda Humphrey, NP   1 tablet at 01/15/23 [id number]   naphazoline-glycerin (CLEAR EYES REDNESS) ophth solution 1-2 drop  1-2 drop Right Eye BID PRN Bobbitt, Hessie Diener E, NP   2 drop at 01/12/23 2256   nystatin cream (MYCOSTATIN)   Topical BID Hampton Abbot, MD   Given at 01/15/23 [phone]   And   triamcinolone cream (KENALOG) 0.1 % cream   Topical BID Hampton Abbot, MD   Given at 01/15/23 0916   pantoprazole (PROTONIX) EC tablet 40 mg  40 mg Oral Daily Corky Sox, MD   40 mg at 01/16/23 [id number]   prazosin (MINIPRESS) capsule 1 mg  1 mg Oral QHS Corky Sox, MD   1 mg at 01/15/23 2105   thiamine (VITAMIN B1) tablet 100 mg  100 mg Oral Daily Revonda Humphrey, NP   100 mg at 01/15/23 0909   Current Outpatient Medications  Medication Sig Dispense Refill   albuterol (VENTOLIN HFA) 108 (90 Base) MCG/ACT inhaler Inhale 1-2 puffs into the lungs every 6 (six) hours as needed for wheezing or shortness of breath. 1 each 0   amLODipine (NORVASC) 10 MG tablet Take 1 tablet (10 mg total) by mouth every morning. 30 tablet 0   hydrOXYzine (ATARAX) 25 MG tablet Take 1 tablet (25 mg total) by mouth 3 (three) times daily as needed  for anxiety. 30 tablet 0   Lurasidone HCl 60 MG TABS Take 1 tablet (60 mg total) by mouth daily with breakfast. 30 tablet 0   melatonin 5 MG TABS Take 1 tablet (5 mg total) by mouth at bedtime as needed. 30 tablet 0   methocarbamol (ROBAXIN) 500 MG tablet Take 1 tablet (500 mg total) by mouth 4 (four) times daily as needed for muscle spasms. 60 tablet 0   Multiple Vitamin (MULTIVITAMIN WITH MINERALS) TABS tablet Take 1 tablet by mouth daily. 30 tablet 0   naphazoline-glycerin (CLEAR EYES REDNESS) 0.012-0.25 % SOLN Place 1-2 drops into the right eye 2 (two)  times daily as needed for eye irritation. 15 mL 0   nicotine polacrilex (NICORETTE) 2 MG gum Take 1 each (2 mg total) by mouth as needed for smoking cessation. 100 tablet 0   nystatin cream (MYCOSTATIN) Apply topically 2 (two) times daily. 30 g 0   pantoprazole (PROTONIX) 40 MG tablet Take 1 tablet (40 mg total) by mouth every morning. 30 tablet 0   prazosin (MINIPRESS) 1 MG capsule Take 1 capsule (1 mg total) by mouth at bedtime. 30 capsule 0   triamcinolone cream (KENALOG) 0.1 % Apply topically 2 (two) times daily. 30 g 0    PTA Medications:  Facility Ordered Medications  Medication   [COMPLETED] acetaminophen (TYLENOL) tablet 650 mg   [COMPLETED] ketorolac (TORADOL) 15 MG/ML injection 15 mg   acetaminophen (TYLENOL) tablet 650 mg   alum & mag hydroxide-simeth (MAALOX/MYLANTA) 200-200-20 MG/5ML suspension 30 mL   magnesium hydroxide (MILK OF MAGNESIA) suspension 30 mL   thiamine (VITAMIN B1) tablet 100 mg   multivitamin with minerals tablet 1 tablet   [EXPIRED] LORazepam (ATIVAN) tablet 1 mg   [EXPIRED] hydrOXYzine (ATARAX) tablet 25 mg   [EXPIRED] loperamide (IMODIUM) capsule 2-4 mg   [EXPIRED] ondansetron (ZOFRAN-ODT) disintegrating tablet 4 mg   Lurasidone HCl TABS 60 mg   methocarbamol (ROBAXIN) tablet 500 mg   naphazoline-glycerin (CLEAR EYES REDNESS) ophth solution 1-2 drop   amLODipine (NORVASC) tablet 10 mg   melatonin  tablet 5 mg   pantoprazole (PROTONIX) EC tablet 40 mg   nystatin cream (MYCOSTATIN)   And   triamcinolone cream (KENALOG) 0.1 % cream   prazosin (MINIPRESS) capsule 1 mg   hydrOXYzine (ATARAX) tablet 25 mg   albuterol (VENTOLIN HFA) 108 (90 Base) MCG/ACT inhaler 1-2 puff   [COMPLETED] hydrOXYzine (ATARAX) tablet 25 mg   albuterol (VENTOLIN HFA) 108 (90 Base) MCG/ACT inhaler 1-2 puff   PTA Medications  Medication Sig   albuterol (VENTOLIN HFA) 108 (90 Base) MCG/ACT inhaler Inhale 1-2 puffs into the lungs every 6 (six) hours as needed for wheezing or shortness of breath.   amLODipine (NORVASC) 10 MG tablet Take 1 tablet (10 mg total) by mouth every morning.   hydrOXYzine (ATARAX) 25 MG tablet Take 1 tablet (25 mg total) by mouth 3 (three) times daily as needed for anxiety.   Lurasidone HCl 60 MG TABS Take 1 tablet (60 mg total) by mouth daily with breakfast.   melatonin 5 MG TABS Take 1 tablet (5 mg total) by mouth at bedtime as needed.   methocarbamol (ROBAXIN) 500 MG tablet Take 1 tablet (500 mg total) by mouth 4 (four) times daily as needed for muscle spasms.   Multiple Vitamin (MULTIVITAMIN WITH MINERALS) TABS tablet Take 1 tablet by mouth daily.   naphazoline-glycerin (CLEAR EYES REDNESS) 0.012-0.25 % SOLN Place 1-2 drops into the right eye 2 (two) times daily as needed for eye irritation.   nicotine polacrilex (NICORETTE) 2 MG gum Take 1 each (2 mg total) by mouth as needed for smoking cessation.   nystatin cream (MYCOSTATIN) Apply topically 2 (two) times daily.   triamcinolone cream (KENALOG) 0.1 % Apply topically 2 (two) times daily.   pantoprazole (PROTONIX) 40 MG tablet Take 1 tablet (40 mg total) by mouth every morning.   prazosin (MINIPRESS) 1 MG capsule Take 1 capsule (1 mg total) by mouth at bedtime.       01/15/2023    9:33 AM 01/14/2023   12:25 PM 01/12/2023    5:55 PM  Depression screen PHQ  2/9  Decreased Interest 0 1 1  Down, Depressed, Hopeless 0 0 1  PHQ - 2 Score 0 1  2  Altered sleeping 0 1 3  Tired, decreased energy 0 0 1  Change in appetite 0 0 0  Feeling bad or failure about yourself  0 1 1  Trouble concentrating 0 0 3  Moving slowly or fidgety/restless 0 0 0  Suicidal thoughts 0 0 1  PHQ-9 Score 0 3 11  Difficult doing work/chores Not difficult at all Not difficult at all Somewhat difficult    Flowsheet Row ED from 01/12/2023 in [facility] Most recent reading at 01/12/2023  5:23 PM ED from 01/12/2023 in [facility] Emergency Department at [facility] Most recent reading at 01/12/2023  9:29 AM ED from 12/31/2022 in [facility] Emergency Department at [facility] Most recent reading at 12/31/2022  9:06 AM  C-SSRS RISK CATEGORY No Risk High Risk High Risk      Musculoskeletal  Strength & Muscle Tone: within normal limits Gait & Station: normal Patient leans: N/A  Psychiatric Specialty Exam  Presentation General Appearance: Appropriate for Environment  Eye Contact:Fair  Speech:Clear and Coherent  Speech Volume:Normal  Handedness:-- (not assessed)   Mood and Affect  Mood:Euthymic  Affect:Congruent   Thought Process  Thought Processes:Coherent; Linear  Descriptions of Associations:Intact  Orientation:Full (Time, Place and Person)  Thought Content:Logical    Hallucinations:Hallucinations: None  Ideas of Reference:None  Suicidal Thoughts:Suicidal Thoughts: No  Homicidal Thoughts:Homicidal Thoughts: No   Sensorium  Memory:Immediate Fair; Recent Fair; Remote Fair  Judgment:Fair  Insight:Fair   Executive Functions  Concentration:Fair  Attention Span:Fair  [place]   Psychomotor Activity  Psychomotor Activity:Psychomotor Activity: Normal   Assets  Assets:Communication Skills; Resilience   Sleep  Sleep:Sleep: Fair    Physical Exam Constitutional:      Appearance: the patient is not toxic-appearing.  Pulmonary:      Effort: Pulmonary effort is normal.  Neurological:     General: No focal deficit present.     Mental Status: the patient is alert and oriented to person, place, and time.   Review of Systems  Respiratory:  Negative for shortness of breath.   Cardiovascular:  Negative for chest pain.  Gastrointestinal:  Negative for abdominal pain, constipation, diarrhea, nausea and vomiting.  Neurological:  Negative for headaches.    BP 107/78 (BP Location: Right Arm)   Pulse 78   Temp 98.4 F (36.9 C) (Oral)   Resp 16   SpO2 98%   Demographic Factors:  None pertinent   Loss Factors: NA   Historical Factors: NA   Risk Reduction Factors:   Positive social support, Positive therapeutic relationship, and Positive coping skills or problem solving skills   Continued Clinical Symptoms:  Substance use    Cognitive Features That Contribute To Risk:  None     Suicide Risk:  Mild   Plan Of Care/Follow-up recommendations:  Activity as tolerated. Diet as recommended by PCP. Keep all scheduled follow-up appointments as recommended.   Patient is instructed to take all prescribed medications as recommended. Report any side effects or adverse reactions to your outpatient psychiatrist. Patient is instructed to abstain from alcohol and illegal drugs while on prescription medications. In the event of worsening symptoms, patient is instructed to call the crisis hotline, 911, or go to the nearest emergency department for evaluation and treatment.     Patient is also instructed prior to discharge  to: Take all medications as prescribed by mental healthcare provider. Report any adverse effects and or reactions from the medicines to outpatient provider promptly. Patient has been instructed & cautioned: To not engage in alcohol and or illegal drug use while on prescription medicines. In the event of worsening symptoms,  patient is instructed to call the crisis hotline, 911 and or go to the nearest ED for  appropriate evaluation and treatment of symptoms. To follow-up with primary care provider for other medical issues, concerns and or health care needs    Disposition: [place] treatment center    Corky Sox, MD 01/16/2023, 9:06 AM

## 2023-01-16 NOTE — Discharge Planning (Signed)
LCSW received phone call from Driver for [facility] who reports they will be here by 10:30AM to transport the patient. Patient and staff made aware. No other needs were reported at this time. LCSW to sign off.   Please inform if further LCSW needs arise prior to discharge.   Lucius Conn, LCSW Clinical Social Worker [facility] Ph: [phone]

## 2023-04-15 ENCOUNTER — Emergency Department (HOSPITAL_COMMUNITY)
Admission: EM | Admit: 2023-04-15 | Discharge: 2023-04-15 | Disposition: A | Discharge status: Home / Self Care | Attending: Emergency Medicine | Admitting: Emergency Medicine

## 2023-04-15 ENCOUNTER — Emergency Department (HOSPITAL_COMMUNITY)

## 2023-04-15 DIAGNOSIS — Z79899 Other long term (current) drug therapy: Secondary | ICD-10-CM | POA: Diagnosis not present

## 2023-04-15 DIAGNOSIS — I10 Essential (primary) hypertension: Secondary | ICD-10-CM | POA: Insufficient documentation

## 2023-04-15 DIAGNOSIS — R531 Weakness: Secondary | ICD-10-CM | POA: Diagnosis not present

## 2023-04-15 DIAGNOSIS — Z0000 Encounter for general adult medical examination without abnormal findings: Secondary | ICD-10-CM | POA: Diagnosis not present

## 2023-04-15 DIAGNOSIS — Z713 Dietary counseling and surveillance: Secondary | ICD-10-CM | POA: Diagnosis not present

## 2023-04-15 DIAGNOSIS — R55 Syncope and collapse: Secondary | ICD-10-CM | POA: Diagnosis present

## 2023-04-15 DIAGNOSIS — E119 Type 2 diabetes mellitus without complications: Secondary | ICD-10-CM | POA: Insufficient documentation

## 2023-04-15 DIAGNOSIS — E559 Vitamin D deficiency, unspecified: Secondary | ICD-10-CM | POA: Diagnosis not present

## 2023-04-15 DIAGNOSIS — J45909 Unspecified asthma, uncomplicated: Secondary | ICD-10-CM | POA: Insufficient documentation

## 2023-04-15 DIAGNOSIS — Z7182 Exercise counseling: Secondary | ICD-10-CM | POA: Diagnosis not present

## 2023-04-15 LAB — CBC
HCT: 41.6 % (ref 36.0–46.0)
Hemoglobin: 13.1 g/dL (ref 12.0–15.0)
MCH: 26.4 pg (ref 26.0–34.0)
MCHC: 31.5 g/dL (ref 30.0–36.0)
MCV: 83.7 fL (ref 80.0–100.0)
Platelets: 257 10*3/uL (ref 150–400)
RBC: 4.97 MIL/uL (ref 3.87–5.11)
RDW: 15.8 % — ABNORMAL HIGH (ref 11.5–15.5)
WBC: 9.8 10*3/uL (ref 4.0–10.5)
nRBC: 0 % (ref 0.0–0.2)

## 2023-04-15 LAB — URINALYSIS, ROUTINE W REFLEX MICROSCOPIC
Bilirubin Urine: NEGATIVE
Glucose, UA: NEGATIVE mg/dL
Ketones, ur: NEGATIVE mg/dL
Nitrite: NEGATIVE
Protein, ur: NEGATIVE mg/dL
Specific Gravity, Urine: 1.017 (ref 1.005–1.030)
pH: 5 (ref 5.0–8.0)

## 2023-04-15 LAB — BASIC METABOLIC PANEL
Anion gap: 9 (ref 5–15)
BUN: 19 mg/dL (ref 6–20)
CO2: 19 mmol/L — ABNORMAL LOW (ref 22–32)
Calcium: 8.6 mg/dL — ABNORMAL LOW (ref 8.9–10.3)
Chloride: 110 mmol/L (ref 98–111)
Creatinine, Ser: 1.1 mg/dL — ABNORMAL HIGH (ref 0.44–1.00)
GFR, Estimated: 58 mL/min — ABNORMAL LOW (ref >60)
Glucose, Bld: 105 mg/dL — ABNORMAL HIGH (ref 70–99)
Potassium: 3.9 mmol/L (ref 3.5–5.1)
Sodium: 138 mmol/L (ref 135–145)

## 2023-04-15 MED ORDER — SODIUM CHLORIDE 0.9 % IV BOLUS
1000.0000 mL | Freq: Once | INTRAVENOUS | Status: AC
  Administered 2023-04-15: 1000 mL via INTRAVENOUS

## 2023-04-15 MED ORDER — ACETAMINOPHEN 325 MG PO TABS
650.0000 mg | ORAL_TABLET | Freq: Once | ORAL | Status: AC
  Administered 2023-04-15: 650 mg via ORAL
  Filled 2023-04-15: qty 2

## 2023-04-15 NOTE — ED Notes (Signed)
Patient verbalizes understanding of discharge instructions. Opportunity for questioning and answers were provided. Armband removed by staff, pt discharged from ED. Ambulated out to lobby  

## 2023-04-15 NOTE — Discharge Instructions (Signed)
Return to the ED with any new or worsening signs or symptoms or return of inability to move Please follow-up with your PCP in 5 days for reevaluation Please read the attached guide concerning syncope and near syncope

## 2023-04-15 NOTE — ED Provider Notes (Signed)
[name] Provider Note   CSN: [id number] Arrival date & time: 04/15/23  1840     History  Chief Complaint  Patient presents with   Loss of Consciousness    Denise Knight is a 57 y.o. female with medical history of substance use, schizophrenia, PTSD, hypertension, GERD, diabetes, depression, asthma.  Patient presents to ED for evaluation of altered renal status.  The patient states that she woke up this morning and donated plasma.  She states she has been doing this for the last 6 months every Tuesday and Thursday.  She states she did not which were feeling medicine doctor and got a pneumonia vaccine shot as well as a standard checkup.  She states that she then went home and was sitting on the couch talking to her daughter.  Patient reports that 5 minutes into the conversation she seemingly stopped being able to speak or move.  She states that she felt very lightheaded and weak and dizzy during this.  She states that this lasted for maybe 10 minutes.  Patient reports that the lasting she remembers is trying to stand up and then waking up in her boyfriend's bed with her boyfriend at the bedside calling 911.  She states that during this event she was unable to raise her arms up.  She is not complaining of a headache but she denies any one-sided weakness or numbness, lightheadedness, dizziness.  She denies a history of the same.  She denies nausea, vomiting, fevers.   Loss of Consciousness Associated symptoms: dizziness and weakness   Associated symptoms: no chest pain and no shortness of breath        Home Medications Prior to Admission medications   Medication Sig Start Date End Date Taking? Authorizing Provider  albuterol (VENTOLIN HFA) 108 (90 Base) MCG/ACT inhaler Inhale 1-2 puffs into the lungs every 6 (six) hours as needed for wheezing or shortness of breath. 01/15/23   Carlyn Reichert, MD  amLODipine (NORVASC) 10 MG tablet Take 1 tablet  (10 mg total) by mouth every morning. 01/15/23   Carlyn Reichert, MD  hydrOXYzine (ATARAX) 25 MG tablet Take 1 tablet (25 mg total) by mouth 3 (three) times daily as needed for anxiety. 01/15/23   Carlyn Reichert, MD  Lurasidone HCl 60 MG TABS Take 1 tablet (60 mg total) by mouth daily with breakfast. 01/16/23   Carlyn Reichert, MD  melatonin 5 MG TABS Take 1 tablet (5 mg total) by mouth at bedtime as needed. 01/15/23   Carlyn Reichert, MD  methocarbamol (ROBAXIN) 500 MG tablet Take 1 tablet (500 mg total) by mouth 4 (four) times daily as needed for muscle spasms. 01/15/23   Carlyn Reichert, MD  Multiple Vitamin (MULTIVITAMIN WITH MINERALS) TABS tablet Take 1 tablet by mouth daily. 01/15/23   Carlyn Reichert, MD  naphazoline-glycerin (CLEAR EYES REDNESS) 0.012-0.25 % SOLN Place 1-2 drops into the right eye 2 (two) times daily as needed for eye irritation. 01/15/23   Carlyn Reichert, MD  nicotine polacrilex (NICORETTE) 2 MG gum Take 1 each (2 mg total) by mouth as needed for smoking cessation. 01/15/23   Carlyn Reichert, MD  nystatin cream (MYCOSTATIN) Apply topically 2 (two) times daily. 01/15/23   Carlyn Reichert, MD  pantoprazole (PROTONIX) 40 MG tablet Take 1 tablet (40 mg total) by mouth every morning. 01/15/23   Carlyn Reichert, MD  prazosin (MINIPRESS) 1 MG capsule Take 1 capsule (1 mg total) by mouth at bedtime. 01/15/23   Jerrel Ivory,  Weston Brass, MD  triamcinolone cream (KENALOG) 0.1 % Apply topically 2 (two) times daily. 01/15/23   Carlyn Reichert, MD  amantadine (SYMMETREL) 100 MG capsule Take 1 capsule (100 mg total) by mouth 2 (two) times daily. 01/17/20 10/13/20  Aldean Baker, NP      Allergies    Morphine and codeine, Aspirin, Food, Lactose, Other, and Tilactase    Review of Systems   Review of Systems  Respiratory:  Negative for shortness of breath.   Cardiovascular:  Positive for syncope. Negative for chest pain.  Neurological:  Positive for dizziness, syncope, weakness and light-headedness.  All other systems  reviewed and are negative.   Physical Exam Updated Vital Signs BP (!) 140/88   Pulse 72   Temp 97.9 F (36.6 C) (Oral)   Resp (!) 23   Ht 5\' 8"  (1.727 m)   Wt 91.2 kg   SpO2 98%   BMI 30.56 kg/m  Physical Exam Vitals and nursing note reviewed.  Constitutional:      General: She is not in acute distress.    Appearance: Normal appearance. She is not ill-appearing, toxic-appearing or diaphoretic.  HENT:     Head: Normocephalic and atraumatic.     Nose: Nose normal.     Mouth/Throat:     Mouth: Mucous membranes are moist.     Pharynx: Oropharynx is clear.  Eyes:     Extraocular Movements: Extraocular movements intact.     Conjunctiva/sclera: Conjunctivae normal.     Pupils: Pupils are equal, round, and reactive to light.  Cardiovascular:     Rate and Rhythm: Normal rate and regular rhythm.  Pulmonary:     Effort: Pulmonary effort is normal.     Breath sounds: Normal breath sounds. No wheezing.  Abdominal:     General: Abdomen is flat. Bowel sounds are normal.     Palpations: Abdomen is soft.     Tenderness: There is no abdominal tenderness.  Musculoskeletal:     Cervical back: Normal range of motion and neck supple. No tenderness.  Skin:    General: Skin is warm and dry.     Capillary Refill: Capillary refill takes less than 2 seconds.  Neurological:     General: No focal deficit present.     Mental Status: She is alert and oriented to person, place, and time.     GCS: GCS eye subscore is 4. GCS verbal subscore is 5. GCS motor subscore is 6.     Cranial Nerves: Cranial nerves 2-12 are intact. No cranial nerve deficit.     Sensory: Sensation is intact. No sensory deficit.     Motor: Motor function is intact. No weakness.     Coordination: Coordination is intact. Heel to [facility] Test normal.     Comments: Reassuring neurological examination.  No focal deficits noted.  No pronator drift, no slurred speech, no facial droop.  5 out of 5 strength bilateral upper and lower  extremities.  CN II through XII intact.     ED Results / Procedures / Treatments   Labs (all labs ordered are listed, but only abnormal results are displayed) Labs Reviewed  BASIC METABOLIC PANEL - Abnormal; Notable for the following components:      Result Value   CO2 19 (*)    Glucose, Bld 105 (*)    Creatinine, Ser 1.10 (*)    Calcium 8.6 (*)    GFR, Estimated 58 (*)    All other components within normal limits  CBC -  Abnormal; Notable for the following components:   RDW 15.8 (*)    All other components within normal limits  URINALYSIS, ROUTINE W REFLEX MICROSCOPIC - Abnormal; Notable for the following components:   APPearance HAZY (*)    Hgb urine dipstick SMALL (*)    Leukocytes,Ua SMALL (*)    Bacteria, UA RARE (*)    All other components within normal limits  CBG MONITORING, ED    EKG None  Radiology CT HEAD WO CONTRAST  Result Date: 04/15/2023 CLINICAL DATA:  Headache. EXAM: CT HEAD WITHOUT CONTRAST TECHNIQUE: Contiguous axial images were obtained from the base of the skull through the vertex without intravenous contrast. RADIATION DOSE REDUCTION: This exam was performed according to the departmental dose-optimization program which includes automated exposure control, adjustment of the mA and/or kV according to patient size and/or use of iterative reconstruction technique. COMPARISON:  MR head examination dated Apr 09, 2021 FINDINGS: Brain: No evidence of acute infarction, hemorrhage, hydrocephalus, extra-axial collection or mass lesion/mass effect. Vascular: No hyperdense vessel or unexpected calcification. Skull: Normal. Negative for fracture or focal lesion. Sinuses/Orbits: Partial opacification of the right maxillary sinus. Mucosal thickening of the ethmoid air cells. Other: None. IMPRESSION: No acute intracranial pathology. Paranasal sinus disease. Electronically Signed   By: Larose Hires D.O.   On: 04/15/2023 19:27    Procedures Procedures   Medications Ordered in  ED Medications  sodium chloride 0.9 % bolus 1,000 mL (1,000 mLs Intravenous New Bag/Given 04/15/23 1947)  acetaminophen (TYLENOL) tablet 650 mg (650 mg Oral Given 04/15/23 2114)    ED Course/ Medical Decision Making/ A&P Clinical Course as of 04/15/23 2232  Tue Apr 15, 2023  1946 Discussed with Onalee Hua who states patient most likely syncopized as she had bilateral inability to move extremities. He states it sounds very consistent with syncope, he does not feel as if further imaging is warranted at this time.  [CG]    Clinical Course User Index [CG] Al Decant, PA-C    Medical Decision Making Amount and/or Complexity of Data Reviewed Labs: ordered. Radiology: ordered.  Risk OTC drugs.   57 year old female presents to ED for evaluation.  Please see HPI for further details.  On examination the patient is afebrile and nontachycardic.  Her lung sounds are clear bilaterally and she is not hypoxic.  Her abdomen is soft and compressible throughout.  Her neurological examination shows no focal neurodeficits.  She is alert and oriented x 3.  CBC without leukocytosis or anemia.  Metabolic panel with CO2 19, glucose 105, and 1.1 however no other electrolyte derangement, anion gap 9.  Urinalysis unremarkable.  CT scan of head does not show any evidence of intracranial pathology.  Discussed patient and exam findings with Dr. Amada Jupiter of neurology who states the patient most likely syncopized that she had bilateral inability to move extremities.  He states it sounds very consistent with syncope and he does not feel as if there is further imaging warranted at this time to include MRI.  Dr. Amada Jupiter believes that her syncopized and is secondary to donating plasma earlier in the day.  At this time the patient will be discharged home where she will follow-up with her PCP in a week.  The patient was advised to return to the ED with any new or worsening signs or symptoms.  She is in  agreement with this plan to discharge home.  She had all of her questions answered to her satisfaction.  She is stable to discharge home  at this time.   Final Clinical Impression(s) / ED Diagnoses Final diagnoses:  Syncope and collapse    Rx / DC Orders ED Discharge Orders     None         Clent Ridges 04/15/23 2232    Lorre Nick, MD 04/17/23 1354

## 2023-04-15 NOTE — ED Triage Notes (Signed)
Pt [facility]IB[facility] from home after having a syncopal episode, pt talking on phone then stopped talking and collapsed. Pt remembers talking then not being able to talk then waking up on the bed. Pt synopsized for approximately 2-3 minutes. Pt donated plasma today but does every Tuesday and Thursday. Had Dr today and a PNA shot, but other than that ADLs were normal.  124/85 80 hr  98% RA 137 CBG

## 2023-04-18 ENCOUNTER — Other Ambulatory Visit: Payer: Self-pay | Admitting: Physician Assistant

## 2023-04-18 DIAGNOSIS — Z1231 Encounter for screening mammogram for malignant neoplasm of breast: Secondary | ICD-10-CM

## 2023-04-22 ENCOUNTER — Ambulatory Visit (HOSPITAL_COMMUNITY): Admission: EM | Admit: 2023-04-22 | Discharge: 2023-04-22 | Disposition: A | Discharge status: Home / Self Care

## 2023-04-22 ENCOUNTER — Encounter (HOSPITAL_COMMUNITY): Payer: Self-pay | Admitting: Emergency Medicine

## 2023-04-22 DIAGNOSIS — L089 Local infection of the skin and subcutaneous tissue, unspecified: Secondary | ICD-10-CM

## 2023-04-22 DIAGNOSIS — L729 Follicular cyst of the skin and subcutaneous tissue, unspecified: Secondary | ICD-10-CM

## 2023-04-22 MED ORDER — DOXYCYCLINE HYCLATE 100 MG PO CAPS: 100.0000 mg | ORAL_CAPSULE | Freq: Two times a day (BID) | ORAL | 0 refills | Status: DC

## 2023-04-22 NOTE — ED Triage Notes (Signed)
Pt c/o pain to mid back where has large, painful swollen cyst. Pt reports been a couple weeks but gotten larger and more painful over past several days. Reports her PCP saw it last week but didn't put her on any antibiotics. Denies drainage. Tried heat yesterday but was too painful.

## 2023-04-22 NOTE — Discharge Instructions (Signed)
It appears you have an infected cyst.  Please start doxycycline 100 mg twice daily for 10 days.  Stay out of the sun while on this medication.  Use warm compresses to encourage drainage.  As we discussed, if this is not improving or draining on its own within a few days please return so we can consider draining it in clinic.  If anything worsens and you have high fever, increasing pain, increasing redness, nausea, vomiting you need to be seen immediately.

## 2023-04-22 NOTE — ED Provider Notes (Signed)
[facility]    CSN: [id number] Arrival date & time: 04/22/23  1112      History   Chief Complaint Chief Complaint  Patient presents with   Abscess    HPI Denise Knight is a 57 y.o. female.   Patient presents today with an enlarging cyst on her back.  Reports that she has had a cyst in this area for several weeks and was seen by her primary care when it began to bother her but was not started on any medication.  Over the past few days it has enlarged and become more painful.  Pain is rated 10 on a 0-10 pain scale, described as throbbing, no aggravating or alleviating factors identified.  She denies history of recurrent skin infections including hidradenitis suppurativa and has never required incision and drainage.  Denies any recent antibiotics.  She has not tried any over-the-counter medications but has tried a warm compress but this was difficult to tolerate as a result of the pain.  She has had MRSA in the past.  She denies any systemic symptoms including fever, nausea, vomiting, rapid expansion of lesion, spread of erythema.    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   Asthma exacerbation, mild 09/20/2021   GERD (gastroesophageal reflux disease)    Primary hypertension    Polysubstance abuse (HCC)    Bipolar II disorder, most recent episode major depressive (HCC) 09/04/2021   Vitamin D deficiency 09/03/2021    Cannabis dependence with current use (HCC) 09/02/2021   MDD (major depressive disorder) 01/12/2020   Severe recurrent major depression without psychotic features (HCC) 01/12/2020   Alcohol use disorder, moderate, dependence (HCC) 01/12/2020   Cocaine abuse (HCC) 12/27/2017   Rectal bleeding 12/27/2017   NSTEMI (non-ST elevated myocardial infarction) (HCC) 12/23/2017   Acute viral bronchitis 06/14/2017   Asthma exacerbation 06/14/2017   Chest pain 07/05/2016    Past Surgical History:  Procedure Laterality Date   CARDIAC CATHETERIZATION  07/05/2016   CARDIAC CATHETERIZATION N/A 07/05/2016   Procedure: Left Heart Cath and Coronary Angiography;  Surgeon: Yates Decamp, MD;  Location: [facility] INVASIVE CV LAB;  Service: Cardiovascular;  Laterality: N/A;   CYST EXCISION Right    "wrist"   DILATION AND CURETTAGE OF UTERUS     FOOT SURGERY Bilateral    "corns removed"   LEFT HEART CATH AND CORONARY ANGIOGRAPHY N/A 12/24/2017   Procedure: LEFT HEART CATH AND CORONARY ANGIOGRAPHY;  Surgeon: Elder Negus, MD;  Location: [facility] INVASIVE CV LAB;  Service: Cardiovascular;  Laterality: N/A;   TUBAL LIGATION      OB History   No obstetric history on file.      Home Medications    Prior to Admission medications   Medication Sig Start Date End Date Taking? Authorizing Provider  doxycycline (VIBRAMYCIN) 100 MG capsule Take 1 capsule (100 mg total) by mouth 2 (two) times daily. 04/22/23  Yes Charletha Dalpe K, PA-C  fluticasone (FLONASE) 50 MCG/ACT nasal spray Place 1 spray into both nostrils daily. 04/15/23  Yes [provider]  hydrOXYzine (VISTARIL) 50 MG capsule Take 50 mg by mouth every 6 (six) hours as needed for anxiety. 03/12/23  Yes [provider]  albuterol (VENTOLIN HFA) 108 (90 Base) MCG/ACT inhaler Inhale 1-2 puffs into the lungs every 6 (six) hours as needed for wheezing or shortness of breath. 01/15/23   Carlyn Reichert, MD  amLODipine (NORVASC) 10 MG tablet Take 1 tablet (10 mg  total) by mouth every morning. 01/15/23   Carlyn Reichert, MD  hydrOXYzine (ATARAX) 25 MG tablet Take 1 tablet (25 mg total) by mouth 3 (three) times daily as needed for anxiety. 01/15/23   Carlyn Reichert, MD  Lurasidone HCl 60 MG TABS Take 1 tablet (60 mg total) by mouth daily with breakfast. 01/16/23   Carlyn Reichert, MD  melatonin 5 MG TABS Take 1 tablet (5 mg total) by mouth at bedtime as needed. 01/15/23   Carlyn Reichert, MD  methocarbamol (ROBAXIN) 500 MG tablet Take 1 tablet (500 mg total) by mouth 4 (four) times daily as needed for muscle spasms. 01/15/23   Carlyn Reichert, MD  Multiple Vitamin (MULTIVITAMIN WITH MINERALS) TABS tablet Take 1 tablet by mouth daily. 01/15/23   Carlyn Reichert, MD  naphazoline-glycerin (CLEAR EYES REDNESS) 0.012-0.25 % SOLN Place 1-2 drops into the right eye 2 (two) times daily as needed for eye irritation. 01/15/23   Carlyn Reichert, MD  nicotine polacrilex (NICORETTE) 2 MG gum Take 1 each (2 mg total) by mouth as needed for smoking cessation. 01/15/23   Carlyn Reichert, MD  nystatin cream (MYCOSTATIN) Apply topically 2 (two) times daily. 01/15/23   Carlyn Reichert, MD  pantoprazole (PROTONIX) 40 MG tablet Take 1 tablet (40 mg total) by mouth every morning. 01/15/23   Carlyn Reichert, MD  prazosin (MINIPRESS) 1 MG capsule Take 1 capsule (1 mg total) by mouth at bedtime. 01/15/23   Carlyn Reichert, MD  triamcinolone cream (KENALOG) 0.1 % Apply topically 2 (two) times daily. 01/15/23   Carlyn Reichert, MD  amantadine (SYMMETREL) 100 MG capsule Take 1 capsule (100 mg total) by mouth 2 (two) times daily. 01/17/20 10/13/20  Aldean Baker, NP    Family History Family History  Problem Relation Age of Onset   Lung cancer Other    Congestive Heart Failure Other    Diabetes Other    Hypertension Other    Colon cancer Neg Hx    Colon polyps Neg Hx    Esophageal cancer Neg Hx    Stomach cancer Neg Hx    Rectal cancer Neg Hx     Social History Social History   Tobacco Use    Smoking status: Some Days    Packs/day: 0.25    Years: 35.00    Additional pack years: 0.00    Total pack years: 8.75    Types: Cigarettes    Last attempt to quit: 05/30/2019    Years since quitting: 3.8   Smokeless tobacco: Never  Vaping Use   Vaping Use: Never used  Substance Use Topics   Alcohol use: Yes    Alcohol/week: 7.0 standard drinks of alcohol    Types: 7 Cans of beer per week   Drug use: Yes    Types: Marijuana     Allergies   Morphine  and codeine, Aspirin, Food, Lactose, Other, and Tilactase   Review of Systems Review of Systems  Constitutional:  Positive for activity change. Negative for appetite change, fatigue and fever.  Gastrointestinal:  Negative for abdominal pain, diarrhea, nausea and vomiting.  Musculoskeletal:  Negative for arthralgias and myalgias.  Skin:  Positive for wound. Negative for color change.     Physical Exam Triage Vital Signs ED Triage Vitals [04/22/23 1127]  Enc Vitals Group     BP 109/82     Pulse Rate 87     Resp 17     Temp (!) 97.5 F (36.4 C)     Temp Source Oral     SpO2 97 %     Weight      Height      Head Circumference      Peak Flow      Pain Score 10     Pain Loc      Pain Edu?      Excl. in GC?    No data found.  Updated Vital Signs BP 109/82 (BP Location: Right Arm)   Pulse 87   Temp (!) 97.5 F (36.4 C) (Oral)   Resp 17   SpO2 97%   Visual Acuity Right Eye Distance:   Left Eye Distance:   Bilateral Distance:    Right Eye Near:   Left Eye Near:    Bilateral Near:     Physical Exam Vitals reviewed.  Constitutional:      General: She is awake. She is not in acute distress.    Appearance: Normal appearance. She is well-developed. She is not ill-appearing.     Comments: Very pleasant female appears stated age in no acute distress sitting comfortably in exam room  HENT:     Head: Normocephalic and atraumatic.  Cardiovascular:     Rate and Rhythm: Normal rate and regular rhythm.     Heart  sounds: Normal heart sounds, S1 normal and S2 normal. No murmur heard. Pulmonary:     Effort: Pulmonary effort is normal.     Breath sounds: Normal breath sounds. No wheezing, rhonchi or rales.     Comments: Clear to auscultation bilaterally Abdominal:     Palpations: Abdomen is soft.     Tenderness: There is no abdominal tenderness.  Skin:    Findings: Abscess present.       Psychiatric:        Behavior: Behavior is cooperative.      UC Treatments / Results  Labs (all labs ordered are listed, but only abnormal results are displayed) Labs Reviewed - No data to display  EKG   Radiology No results found.  Procedures Procedures (including critical care time)  Medications Ordered in UC Medications - No data to display  Initial Impression / Assessment and Plan / UC Course  I have reviewed the triage vital signs and the nursing notes.  Pertinent labs & imaging results that were available during my care of the patient were reviewed by me and considered in my medical decision making (see chart for details).     Patient is well-appearing, afebrile, nontoxic, nontachycardic.  Symptoms consistent with infected cyst.  We discussed potential utility of I&D to relieve his symptoms and encourage healing.  After discussion of risks and benefits including potentially incomplete drainage patient elected to initiate antibiotics for the time being.  She was started on doxycycline 100 mg twice daily for 10 days.  Encouraged her to keep area clean and apply  warm compresses several times per day to encourage drainage.  She can use over-the-counter analgesics as needed.  We did discuss that if this does not improve with antibiotics that she should return for reevaluation which we would reconsider I&D.  If she has any worsening symptoms including fever, increasing pain, rapid spread of erythema, enlarging lesion, nausea, vomiting she needs to be seen immediately.  Strict return precautions given.   Patient declined work excuse note.  Final Clinical Impressions(s) / UC Diagnoses   Final diagnoses:  Infected cyst of skin     Discharge Instructions      It appears you have an infected cyst.  Please start doxycycline 100 mg twice daily for 10 days.  Stay out of the sun while on this medication.  Use warm compresses to encourage drainage.  As we discussed, if this is not improving or draining on its own within a few days please return so we can consider draining it in clinic.  If anything worsens and you have high fever, increasing pain, increasing redness, nausea, vomiting you need to be seen immediately.    ED Prescriptions     Medication Sig Dispense Auth. Provider   doxycycline (VIBRAMYCIN) 100 MG capsule Take 1 capsule (100 mg total) by mouth 2 (two) times daily. 20 capsule Chelsea Nusz, Noberto Retort, PA-C      PDMP not reviewed this encounter.   Jeani Hawking, PA-C 04/22/23 1146

## 2023-05-11 ENCOUNTER — Emergency Department (HOSPITAL_COMMUNITY)

## 2023-05-11 ENCOUNTER — Emergency Department (HOSPITAL_COMMUNITY)
Admission: EM | Admit: 2023-05-11 | Discharge: 2023-05-11 | Disposition: A | Discharge status: Home / Self Care | Attending: Emergency Medicine | Admitting: Emergency Medicine

## 2023-05-11 ENCOUNTER — Encounter (HOSPITAL_COMMUNITY): Payer: Self-pay

## 2023-05-11 DIAGNOSIS — R079 Chest pain, unspecified: Secondary | ICD-10-CM

## 2023-05-11 DIAGNOSIS — E119 Type 2 diabetes mellitus without complications: Secondary | ICD-10-CM | POA: Insufficient documentation

## 2023-05-11 DIAGNOSIS — I2510 Atherosclerotic heart disease of native coronary artery without angina pectoris: Secondary | ICD-10-CM | POA: Diagnosis not present

## 2023-05-11 LAB — BASIC METABOLIC PANEL
Anion gap: 19 — ABNORMAL HIGH (ref 5–15)
BUN: 12 mg/dL (ref 6–20)
CO2: 15 mmol/L — ABNORMAL LOW (ref 22–32)
Calcium: 9.5 mg/dL (ref 8.9–10.3)
Chloride: 99 mmol/L (ref 98–111)
Creatinine, Ser: 1.22 mg/dL — ABNORMAL HIGH (ref 0.44–1.00)
GFR, Estimated: 51 mL/min — ABNORMAL LOW (ref >60)
Glucose, Bld: 83 mg/dL (ref 70–99)
Potassium: 4.4 mmol/L (ref 3.5–5.1)
Sodium: 133 mmol/L — ABNORMAL LOW (ref 135–145)

## 2023-05-11 LAB — CBC WITH DIFFERENTIAL/PLATELET
Abs Immature Granulocytes: 0.04 10*3/uL (ref 0.00–0.07)
Basophils Absolute: 0.1 10*3/uL (ref 0.0–0.1)
Basophils Relative: 1 %
Eosinophils Absolute: 0.1 10*3/uL (ref 0.0–0.5)
Eosinophils Relative: 1 %
HCT: 40.1 % (ref 36.0–46.0)
Hemoglobin: 13.2 g/dL (ref 12.0–15.0)
Immature Granulocytes: 0 %
Lymphocytes Relative: 20 %
Lymphs Abs: 2.2 10*3/uL (ref 0.7–4.0)
MCH: 27.6 pg (ref 26.0–34.0)
MCHC: 32.9 g/dL (ref 30.0–36.0)
MCV: 83.9 fL (ref 80.0–100.0)
Monocytes Absolute: 0.8 10*3/uL (ref 0.1–1.0)
Monocytes Relative: 7 %
Neutro Abs: 7.9 10*3/uL — ABNORMAL HIGH (ref 1.7–7.7)
Neutrophils Relative %: 71 %
Platelets: 256 10*3/uL (ref 150–400)
RBC: 4.78 MIL/uL (ref 3.87–5.11)
RDW: 15.6 % — ABNORMAL HIGH (ref 11.5–15.5)
WBC: 11.1 10*3/uL — ABNORMAL HIGH (ref 4.0–10.5)
nRBC: 0 % (ref 0.0–0.2)

## 2023-05-11 LAB — TROPONIN I (HIGH SENSITIVITY)
Troponin I (High Sensitivity): 9 ng/L (ref <18)
Troponin I (High Sensitivity): 10 ng/L (ref <18)

## 2023-05-11 NOTE — Discharge Instructions (Signed)
You have been seen and discharged from the emergency department.  Your blood work and heart workup were normal.  Follow-up with your primary provider for further evaluation and further care. Take home medications as prescribed. If you have any worsening symptoms or further concerns for your health please return to an emergency department for further evaluation.

## 2023-05-11 NOTE — ED Triage Notes (Signed)
Pt BIB [facility] for c/o right sided chest pain that started at 0600 this morning with n/v. Reports smoking crack for the last two days.   BP 156/68 HR 90 100% room air

## 2023-05-11 NOTE — ED Provider Notes (Signed)
[place] EMERGENCY DEPARTMENT AT [facility] Provider Note   CSN: [id number] Arrival date & time: 05/11/23  4098     History  Chief Complaint  Patient presents with   Chest Pain    Denise Knight is a 57 y.o. female.  HPI   57 year old female with past medical history of CAD, DM, polysubstance abuse presents emergency department with right-sided chest pain that started around 6 AM this morning.  Patient states that she woke up this morning and noticed the right-sided chest pain.  It has been persistent however improving.  No associated shortness of breath.  Pain does not radiate to her back or abdomen.  No swelling of her lower extremities.  Patient admits that she does smoke crack from time to time but the last time was 2 days ago.  Denies any nausea/vomiting/diarrhea.  Home Medications Prior to Admission medications   Medication Sig Start Date End Date Taking? Authorizing Provider  albuterol (VENTOLIN HFA) 108 (90 Base) MCG/ACT inhaler Inhale 1-2 puffs into the lungs every 6 (six) hours as needed for wheezing or shortness of breath. 01/15/23   Carlyn Reichert, MD  amLODipine (NORVASC) 10 MG tablet Take 1 tablet (10 mg total) by mouth every morning. 01/15/23   Carlyn Reichert, MD  doxycycline (VIBRAMYCIN) 100 MG capsule Take 1 capsule (100 mg total) by mouth 2 (two) times daily. 04/22/23   Raspet, Denny Peon K, PA-C  fluticasone (FLONASE) 50 MCG/ACT nasal spray Place 1 spray into both nostrils daily. 04/15/23   [provider]  hydrOXYzine (ATARAX) 25 MG tablet Take 1 tablet (25 mg total) by mouth 3 (three) times daily as needed for anxiety. 01/15/23   Carlyn Reichert, MD  hydrOXYzine (VISTARIL) 50 MG capsule Take 50 mg by mouth every 6 (six) hours as needed for anxiety. 03/12/23   [provider]  Lurasidone HCl 60 MG TABS Take 1 tablet (60 mg total) by mouth daily with breakfast. 01/16/23   Carlyn Reichert, MD  melatonin 5 MG TABS Take 1 tablet (5 mg total) by mouth at  bedtime as needed. 01/15/23   Carlyn Reichert, MD  methocarbamol (ROBAXIN) 500 MG tablet Take 1 tablet (500 mg total) by mouth 4 (four) times daily as needed for muscle spasms. 01/15/23   Carlyn Reichert, MD  Multiple Vitamin (MULTIVITAMIN WITH MINERALS) TABS tablet Take 1 tablet by mouth daily. 01/15/23   Carlyn Reichert, MD  naphazoline-glycerin (CLEAR EYES REDNESS) 0.012-0.25 % SOLN Place 1-2 drops into the right eye 2 (two) times daily as needed for eye irritation. 01/15/23   Carlyn Reichert, MD  nicotine polacrilex (NICORETTE) 2 MG gum Take 1 each (2 mg total) by mouth as needed for smoking cessation. 01/15/23   Carlyn Reichert, MD  nystatin cream (MYCOSTATIN) Apply topically 2 (two) times daily. 01/15/23   Carlyn Reichert, MD  pantoprazole (PROTONIX) 40 MG tablet Take 1 tablet (40 mg total) by mouth every morning. 01/15/23   Carlyn Reichert, MD  prazosin (MINIPRESS) 1 MG capsule Take 1 capsule (1 mg total) by mouth at bedtime. 01/15/23   Carlyn Reichert, MD  triamcinolone cream (KENALOG) 0.1 % Apply topically 2 (two) times daily. 01/15/23   Carlyn Reichert, MD  amantadine (SYMMETREL) 100 MG capsule Take 1 capsule (100 mg total) by mouth 2 (two) times daily. 01/17/20 10/13/20  Aldean Baker, NP      Allergies    Morphine and codeine, Aspirin, Food, Lactose, Other, and Tilactase    Review of Systems   Review of Systems  Physical Exam Updated Vital Signs BP (!) 151/97 (BP Location: Left Arm)   Pulse 86   Temp 98.8 F (37.1 C) (Oral)   Resp 18   SpO2 98%  Physical Exam  ED Results / Procedures / Treatments   Labs (all labs ordered are listed, but only abnormal results are displayed) Labs Reviewed  BASIC METABOLIC PANEL - Abnormal; Notable for the following components:      Result Value   Sodium 133 (*)    CO2 15 (*)    Creatinine, Ser 1.22 (*)    GFR, Estimated 51 (*)    Anion gap 19 (*)    All other components within normal limits  CBC WITH DIFFERENTIAL/PLATELET - Abnormal; Notable for the  following components:   WBC 11.1 (*)    RDW 15.6 (*)    Neutro Abs 7.9 (*)    All other components within normal limits  CBC WITH DIFFERENTIAL/PLATELET  TROPONIN I (HIGH SENSITIVITY)  TROPONIN I (HIGH SENSITIVITY)    EKG EKG Interpretation Date/Time:  Sunday May 11 2023 08:59:43 EDT Ventricular Rate:  108 PR Interval:  146 QRS Duration:  69 QT Interval:  337 QTC Calculation: 450 R Axis:   46  Text Interpretation: Sinus tachycardia Abnormal R-wave progression, early transition Similar to preivous Confirmed by Coralee Pesa [phone]) on 05/11/2023 9:42:51 AM  Radiology DG Chest Port 1 View  Result Date: 05/11/2023 CLINICAL DATA:  Chest pain. EXAM: PORTABLE CHEST 1 VIEW COMPARISON:  01/12/2023 FINDINGS: Lungs are adequately inflated without focal lobar consolidation or effusion. Cardiomediastinal silhouette and remainder of the exam is unchanged. IMPRESSION: No acute cardiopulmonary disease. Electronically Signed   By: Elberta Fortis M.D.   On: 05/11/2023 10:08    Procedures Procedures    Medications Ordered in ED Medications - No data to display  ED Course/ Medical Decision Making/ A&P                             Medical Decision Making Amount and/or Complexity of Data Reviewed Labs: ordered. Radiology: ordered.   57 year old female presents emergency department for right-sided chest pain that is almost completely resolved.  Vital signs on my evaluation are normal.  There was tachycardia documented in triage but this resolved without intervention on my initial evaluation.  Patient is nontoxic-appearing.  Lung sounds are clear.  EKG shows no ischemic changes.  Blood work is reassuring with 2 negative troponins and no delta.  On reevaluation patient states the chest pain has resolved.  Low suspicion for PE/dissection with no shortness of breath and resolved symptoms.  Patient at this time appears safe and stable for discharge and close outpatient follow up. Discharge plan  and strict return to ED precautions discussed, patient verbalizes understanding and agreement.        Final Clinical Impression(s) / ED Diagnoses Final diagnoses:  Chest pain, unspecified type    Rx / DC Orders ED Discharge Orders     None         Rozelle Logan, DO 05/11/23 1502

## 2023-05-28 DIAGNOSIS — N898 Other specified noninflammatory disorders of vagina: Secondary | ICD-10-CM | POA: Diagnosis not present

## 2023-05-28 DIAGNOSIS — N6325 Unspecified lump in the left breast, overlapping quadrants: Secondary | ICD-10-CM | POA: Diagnosis not present

## 2023-05-28 DIAGNOSIS — Z124 Encounter for screening for malignant neoplasm of cervix: Secondary | ICD-10-CM | POA: Diagnosis not present

## 2023-05-28 DIAGNOSIS — Z113 Encounter for screening for infections with a predominantly sexual mode of transmission: Secondary | ICD-10-CM | POA: Diagnosis not present

## 2023-05-30 ENCOUNTER — Other Ambulatory Visit: Payer: Self-pay | Admitting: Physician Assistant

## 2023-05-30 DIAGNOSIS — N6325 Unspecified lump in the left breast, overlapping quadrants: Secondary | ICD-10-CM

## 2023-07-15 ENCOUNTER — Other Ambulatory Visit

## 2023-07-15 ENCOUNTER — Ambulatory Visit

## 2023-07-15 ENCOUNTER — Encounter

## 2023-07-16 ENCOUNTER — Other Ambulatory Visit: Payer: Self-pay

## 2023-07-16 ENCOUNTER — Emergency Department (HOSPITAL_COMMUNITY)
Admission: EM | Admit: 2023-07-16 | Discharge: 2023-07-16 | Disposition: A | Discharge status: Other Acute Inpatient Hospital | Attending: Emergency Medicine | Admitting: Emergency Medicine

## 2023-07-16 ENCOUNTER — Encounter: Payer: Self-pay | Admitting: Psychiatry

## 2023-07-16 ENCOUNTER — Inpatient Hospital Stay
Admission: AD | Admit: 2023-07-16 | Discharge: 2023-07-24 | DRG: 885 | Disposition: A | Discharge status: Home / Self Care | Source: Intra-hospital | Attending: Psychiatry | Admitting: Psychiatry

## 2023-07-16 DIAGNOSIS — I2510 Atherosclerotic heart disease of native coronary artery without angina pectoris: Secondary | ICD-10-CM | POA: Diagnosis present

## 2023-07-16 DIAGNOSIS — F1010 Alcohol abuse, uncomplicated: Secondary | ICD-10-CM | POA: Diagnosis present

## 2023-07-16 DIAGNOSIS — F17210 Nicotine dependence, cigarettes, uncomplicated: Secondary | ICD-10-CM | POA: Diagnosis present

## 2023-07-16 DIAGNOSIS — F10139 Alcohol abuse with withdrawal, unspecified: Secondary | ICD-10-CM | POA: Diagnosis present

## 2023-07-16 DIAGNOSIS — Z79899 Other long term (current) drug therapy: Secondary | ICD-10-CM | POA: Diagnosis not present

## 2023-07-16 DIAGNOSIS — Z5986 Financial insecurity: Secondary | ICD-10-CM | POA: Diagnosis not present

## 2023-07-16 DIAGNOSIS — Z885 Allergy status to narcotic agent status: Secondary | ICD-10-CM

## 2023-07-16 DIAGNOSIS — Z9151 Personal history of suicidal behavior: Secondary | ICD-10-CM

## 2023-07-16 DIAGNOSIS — I10 Essential (primary) hypertension: Secondary | ICD-10-CM | POA: Insufficient documentation

## 2023-07-16 DIAGNOSIS — Z833 Family history of diabetes mellitus: Secondary | ICD-10-CM | POA: Diagnosis not present

## 2023-07-16 DIAGNOSIS — Z801 Family history of malignant neoplasm of trachea, bronchus and lung: Secondary | ICD-10-CM

## 2023-07-16 DIAGNOSIS — Z560 Unemployment, unspecified: Secondary | ICD-10-CM

## 2023-07-16 DIAGNOSIS — G4700 Insomnia, unspecified: Secondary | ICD-10-CM | POA: Diagnosis present

## 2023-07-16 DIAGNOSIS — Z8249 Family history of ischemic heart disease and other diseases of the circulatory system: Secondary | ICD-10-CM

## 2023-07-16 DIAGNOSIS — F1910 Other psychoactive substance abuse, uncomplicated: Secondary | ICD-10-CM | POA: Diagnosis present

## 2023-07-16 DIAGNOSIS — K219 Gastro-esophageal reflux disease without esophagitis: Secondary | ICD-10-CM | POA: Diagnosis present

## 2023-07-16 DIAGNOSIS — E119 Type 2 diabetes mellitus without complications: Secondary | ICD-10-CM | POA: Diagnosis present

## 2023-07-16 DIAGNOSIS — K648 Other hemorrhoids: Secondary | ICD-10-CM | POA: Diagnosis present

## 2023-07-16 DIAGNOSIS — I214 Non-ST elevation (NSTEMI) myocardial infarction: Secondary | ICD-10-CM | POA: Diagnosis present

## 2023-07-16 DIAGNOSIS — R45851 Suicidal ideations: Secondary | ICD-10-CM | POA: Insufficient documentation

## 2023-07-16 DIAGNOSIS — F314 Bipolar disorder, current episode depressed, severe, without psychotic features: Principal | ICD-10-CM | POA: Diagnosis present

## 2023-07-16 DIAGNOSIS — F1414 Cocaine abuse with cocaine-induced mood disorder: Secondary | ICD-10-CM | POA: Diagnosis present

## 2023-07-16 DIAGNOSIS — Z886 Allergy status to analgesic agent status: Secondary | ICD-10-CM

## 2023-07-16 DIAGNOSIS — Z87442 Personal history of urinary calculi: Secondary | ICD-10-CM

## 2023-07-16 DIAGNOSIS — J45909 Unspecified asthma, uncomplicated: Secondary | ICD-10-CM | POA: Diagnosis present

## 2023-07-16 DIAGNOSIS — R109 Unspecified abdominal pain: Secondary | ICD-10-CM | POA: Diagnosis present

## 2023-07-16 DIAGNOSIS — F1210 Cannabis abuse, uncomplicated: Secondary | ICD-10-CM | POA: Diagnosis present

## 2023-07-16 DIAGNOSIS — F419 Anxiety disorder, unspecified: Secondary | ICD-10-CM | POA: Diagnosis present

## 2023-07-16 DIAGNOSIS — R10A0 Flank pain, unspecified side: Secondary | ICD-10-CM | POA: Insufficient documentation

## 2023-07-16 DIAGNOSIS — F4310 Post-traumatic stress disorder, unspecified: Secondary | ICD-10-CM | POA: Diagnosis present

## 2023-07-16 DIAGNOSIS — F1410 Cocaine abuse, uncomplicated: Secondary | ICD-10-CM

## 2023-07-16 DIAGNOSIS — Z91018 Allergy to other foods: Secondary | ICD-10-CM

## 2023-07-16 DIAGNOSIS — K644 Residual hemorrhoidal skin tags: Secondary | ICD-10-CM | POA: Diagnosis present

## 2023-07-16 DIAGNOSIS — F1494 Cocaine use, unspecified with cocaine-induced mood disorder: Secondary | ICD-10-CM | POA: Diagnosis present

## 2023-07-16 DIAGNOSIS — Z8711 Personal history of peptic ulcer disease: Secondary | ICD-10-CM

## 2023-07-16 DIAGNOSIS — F3130 Bipolar disorder, current episode depressed, mild or moderate severity, unspecified: Secondary | ICD-10-CM

## 2023-07-16 LAB — COMPREHENSIVE METABOLIC PANEL WITH GFR
ALT: 17 U/L (ref 0–44)
AST: 16 U/L (ref 15–41)
Albumin: 3.9 g/dL (ref 3.5–5.0)
Alkaline Phosphatase: 57 U/L (ref 38–126)
Anion gap: 10 (ref 5–15)
BUN: 7 mg/dL (ref 6–20)
CO2: 22 mmol/L (ref 22–32)
Calcium: 9.1 mg/dL (ref 8.9–10.3)
Chloride: 105 mmol/L (ref 98–111)
Creatinine, Ser: 1.16 mg/dL — ABNORMAL HIGH (ref 0.44–1.00)
GFR, Estimated: 55 mL/min — ABNORMAL LOW
Glucose, Bld: 107 mg/dL — ABNORMAL HIGH (ref 70–99)
Potassium: 3.5 mmol/L (ref 3.5–5.1)
Sodium: 137 mmol/L (ref 135–145)
Total Bilirubin: 0.5 mg/dL (ref 0.3–1.2)
Total Protein: 7.6 g/dL (ref 6.5–8.1)

## 2023-07-16 LAB — RAPID URINE DRUG SCREEN, HOSP PERFORMED
Amphetamines: NOT DETECTED
Barbiturates: NOT DETECTED
Benzodiazepines: NOT DETECTED
Cocaine: POSITIVE — AB
Opiates: NOT DETECTED
Tetrahydrocannabinol: NOT DETECTED

## 2023-07-16 LAB — CBC
HCT: 41 % (ref 36.0–46.0)
Hemoglobin: 13.2 g/dL (ref 12.0–15.0)
MCH: 26.5 pg (ref 26.0–34.0)
MCHC: 32.2 g/dL (ref 30.0–36.0)
MCV: 82.3 fL (ref 80.0–100.0)
Platelets: 339 10*3/uL (ref 150–400)
RBC: 4.98 MIL/uL (ref 3.87–5.11)
RDW: 16.3 % — ABNORMAL HIGH (ref 11.5–15.5)
WBC: 7.3 10*3/uL (ref 4.0–10.5)
nRBC: 0 % (ref 0.0–0.2)

## 2023-07-16 LAB — ETHANOL: Alcohol, Ethyl (B): <10 mg/dL

## 2023-07-16 MED ORDER — LORAZEPAM 2 MG PO TABS
2.0000 mg | ORAL_TABLET | Freq: Three times a day (TID) | ORAL | Status: DC | PRN
  Administered 2023-07-19: 2 mg via ORAL
  Filled 2023-07-16: qty 1

## 2023-07-16 MED ORDER — DIPHENHYDRAMINE HCL 50 MG/ML IJ SOLN: 50.0000 mg | Freq: Three times a day (TID) | INTRAMUSCULAR | Status: DC | PRN

## 2023-07-16 MED ORDER — THIAMINE HCL 100 MG/ML IJ SOLN: 100.0000 mg | Freq: Every day | INTRAMUSCULAR | Status: DC

## 2023-07-16 MED ORDER — TRAZODONE HCL 100 MG PO TABS
100.0000 mg | ORAL_TABLET | Freq: Every day | ORAL | Status: DC
  Filled 2023-07-16: qty 1

## 2023-07-16 MED ORDER — PANTOPRAZOLE SODIUM 40 MG PO TBEC
40.0000 mg | DELAYED_RELEASE_TABLET | Freq: Every morning | ORAL | Status: DC
  Administered 2023-07-17: 40 mg via ORAL
  Filled 2023-07-16: qty 1

## 2023-07-16 MED ORDER — THIAMINE MONONITRATE 100 MG PO TABS
100.0000 mg | ORAL_TABLET | Freq: Every day | ORAL | Status: DC
  Administered 2023-07-17 – 2023-07-24 (×7): 100 mg via ORAL
  Filled 2023-07-16 (×8): qty 1

## 2023-07-16 MED ORDER — FLUTICASONE PROPIONATE 50 MCG/ACT NA SUSP
1.0000 | Freq: Every day | NASAL | Status: DC
  Administered 2023-07-19 – 2023-07-24 (×2): 1 via NASAL
  Filled 2023-07-16: qty 16

## 2023-07-16 MED ORDER — ADULT MULTIVITAMIN W/MINERALS CH
1.0000 | ORAL_TABLET | Freq: Every day | ORAL | Status: DC
  Administered 2023-07-16: 1 via ORAL
  Filled 2023-07-16: qty 1

## 2023-07-16 MED ORDER — FLUOXETINE HCL 20 MG PO CAPS
20.0000 mg | ORAL_CAPSULE | Freq: Every day | ORAL | Status: DC
  Administered 2023-07-17: 20 mg via ORAL
  Filled 2023-07-16: qty 1

## 2023-07-16 MED ORDER — LORAZEPAM 2 MG/ML IJ SOLN: 2.0000 mg | Freq: Three times a day (TID) | INTRAMUSCULAR | Status: DC | PRN

## 2023-07-16 MED ORDER — ADULT MULTIVITAMIN W/MINERALS CH
1.0000 | ORAL_TABLET | Freq: Every day | ORAL | Status: DC
  Administered 2023-07-17 – 2023-07-24 (×7): 1 via ORAL
  Filled 2023-07-16 (×8): qty 1

## 2023-07-16 MED ORDER — ALBUTEROL SULFATE HFA 108 (90 BASE) MCG/ACT IN AERS
1.0000 | INHALATION_SPRAY | Freq: Four times a day (QID) | RESPIRATORY_TRACT | Status: DC | PRN
  Administered 2023-07-19 – 2023-07-23 (×3): 2 via RESPIRATORY_TRACT
  Filled 2023-07-16: qty 6.7

## 2023-07-16 MED ORDER — FOLIC ACID 1 MG PO TABS
1.0000 mg | ORAL_TABLET | Freq: Every day | ORAL | Status: DC
  Administered 2023-07-17 – 2023-07-24 (×7): 1 mg via ORAL
  Filled 2023-07-16 (×9): qty 1

## 2023-07-16 MED ORDER — LORAZEPAM 1 MG PO TABS: 1.0000 mg | ORAL_TABLET | ORAL | Status: DC | PRN

## 2023-07-16 MED ORDER — HALOPERIDOL LACTATE 5 MG/ML IJ SOLN: 5.0000 mg | Freq: Three times a day (TID) | INTRAMUSCULAR | Status: DC | PRN

## 2023-07-16 MED ORDER — ACETAMINOPHEN 325 MG PO TABS
650.0000 mg | ORAL_TABLET | Freq: Four times a day (QID) | ORAL | Status: DC | PRN
  Administered 2023-07-17 – 2023-07-23 (×4): 650 mg via ORAL
  Filled 2023-07-16 (×4): qty 2

## 2023-07-16 MED ORDER — LORAZEPAM 1 MG PO TABS
1.0000 mg | ORAL_TABLET | ORAL | Status: AC | PRN
  Administered 2023-07-18: 1 mg via ORAL
  Filled 2023-07-16: qty 1

## 2023-07-16 MED ORDER — DIPHENHYDRAMINE HCL 25 MG PO CAPS
50.0000 mg | ORAL_CAPSULE | Freq: Three times a day (TID) | ORAL | Status: DC | PRN
  Administered 2023-07-20 – 2023-07-23 (×2): 50 mg via ORAL
  Filled 2023-07-16 (×2): qty 2

## 2023-07-16 MED ORDER — ALUM & MAG HYDROXIDE-SIMETH 200-200-20 MG/5ML PO SUSP: 30.0000 mL | ORAL | Status: DC | PRN

## 2023-07-16 MED ORDER — TRAZODONE HCL 100 MG PO TABS
100.0000 mg | ORAL_TABLET | Freq: Every day | ORAL | Status: DC
  Administered 2023-07-17 – 2023-07-23 (×7): 100 mg via ORAL
  Filled 2023-07-16 (×7): qty 1

## 2023-07-16 MED ORDER — FOLIC ACID 1 MG PO TABS
1.0000 mg | ORAL_TABLET | Freq: Every day | ORAL | Status: DC
  Administered 2023-07-16: 1 mg via ORAL
  Filled 2023-07-16: qty 1

## 2023-07-16 MED ORDER — MAGNESIUM HYDROXIDE 400 MG/5ML PO SUSP
30.0000 mL | Freq: Every day | ORAL | Status: DC | PRN
  Administered 2023-07-23: 30 mL via ORAL
  Filled 2023-07-16: qty 30

## 2023-07-16 MED ORDER — THIAMINE MONONITRATE 100 MG PO TABS
100.0000 mg | ORAL_TABLET | Freq: Every day | ORAL | Status: DC
  Administered 2023-07-16: 100 mg via ORAL
  Filled 2023-07-16: qty 1

## 2023-07-16 MED ORDER — HALOPERIDOL 5 MG PO TABS
5.0000 mg | ORAL_TABLET | Freq: Three times a day (TID) | ORAL | Status: DC | PRN
  Administered 2023-07-20: 5 mg via ORAL
  Filled 2023-07-16: qty 1

## 2023-07-16 MED ORDER — FLUOXETINE HCL 20 MG PO CAPS
20.0000 mg | ORAL_CAPSULE | Freq: Every day | ORAL | Status: DC
  Administered 2023-07-16: 20 mg via ORAL
  Filled 2023-07-16: qty 1

## 2023-07-16 NOTE — ED Notes (Signed)
Patient has remained calm and cooperative.  Safe transport here for patient.  Patient to be transported to [facility] at this time.  Matt, RN called and made aware

## 2023-07-16 NOTE — ED Notes (Signed)
Patient report given to Susy Frizzle, Charity fundraiser at [employer]

## 2023-07-16 NOTE — ED Notes (Signed)
Pt was given lunch tray.  

## 2023-07-16 NOTE — ED Notes (Signed)
Pt. Was given graham crackers and a ginger ale

## 2023-07-16 NOTE — ED Notes (Signed)
Pt previously made aware of need for urine. Currently appears to be sleeping in bed. In NAD.

## 2023-07-16 NOTE — Consult Note (Signed)
[facility] ED ASSESSMENT   Reason for Consult:  Psych Consult Referring Physician:  Benjiman Core Patient Identification: Denise Knight MRN:  [id number] ED Chief Complaint: Polysubstance abuse [facility])  Diagnosis:  Principal Problem:   Polysubstance abuse (HCC) Active Problems:   Suicidal ideation   ED Assessment Time Calculation: Start Time: 1500 Stop Time: 1553 Total Time in Minutes (Assessment Completion): 53  HPI:  Denise Knight is a 57 y.o. female patient presents to the [facility] ED with complaints of suicidal ideation and an attempt.  She says she walked into traffic this AM, "but nobody would hit me."  She has a history of cocaine abuse, MDD, alcohol abuse, cannabis dependence, bipolar affective disorder and PTSD.    Subjective:   Denise Knight is a 57 y.o. female patient presents to the [facility] ED with complaints of suicidal ideation and an attempt.  She says she walked into traffic this AM, "but nobody would hit me."  She has a history of cocaine abuse, MDD, alcohol abuse, cannabis dependence, bipolar affective disorder and PTSD.   Denise Knight, 57 y.o., female patient seen face to face by this provider, consulted with Dr. Lucianne Muss; and chart reviewed on 07/16/23.  On evaluation Denise Knight reports she is currently suicidal and that is is because she is using "crack cocaine." She says she has been using crack cocaine for more than 20 years "off and on."  In addition she says she is drinking alcohol regularly.  Patient says she has not slept in a week.  She says she wants treatment.  Patient lives with her daughter and grandchildren.      During evaluation Denise Knight is laying in bed in no acute distress. She is alert, oriented x 4, calm, cooperative and attentive.  Her mood is depressed with congruent affect. She has normal speech, and behavior.  Objectively there is no evidence of psychosis/mania or delusional thinking.  Patient is able to converse coherently, goal directed  thoughts, no distractibility, or pre-occupation. She also denies homicidal ideation, psychosis, and paranoia. She endorses suicidal ideation. Patient answered question appropriately.    Patient case reviewed and discussed with Dr Lucianne Muss. Patient is a danger to herself and meets criteria for inpatient psychiatric hospitalization for stabilization and treatment for suicidal ideation and substance abuse. Recommend inpatient treatment.   Past Psychiatric History: History of cocaine abuse, MDD, alcohol abuse, cannabis dependence, bipolar affective disorder and PTSD. Patient has previously been inpatient.    Risk to Self or Others: Is the patient at risk to self? Yes Has the patient been a risk to self in the past 6 months? Yes Has the patient been a risk to self within the distant past? Yes Is the patient a risk to others? No Has the patient been a risk to others in the past 6 months? No Has the patient been a risk to others within the distant past? No  [place] Scale:  Flowsheet Row ED from 07/16/2023 in [facility] Emergency Department at [facility] ED from 05/11/2023 in [facility] Emergency Department at [facility] ED from 04/22/2023 in [facility] at [facility] RISK CATEGORY High Risk No Risk No Risk        Substance Abuse:   currently crack cocaine and alcohol  Past Medical History:  Past Medical History:  Diagnosis Date   Allergy    seasonal   Anxiety    Arthritis    "left knee" (07/05/2016)   Asthma  Bipolar 1 disorder (HCC)    Depression    Diabetes mellitus without complication (HCC)    pre-   GERD (gastroesophageal reflux disease)    Hypertension    MI (mitral incompetence)    Post traumatic stress disorder    Schizophrenia (HCC)    Stomach ulcer    Substance abuse (HCC)    cocaine quit july 2020   Suicide attempt [facility])     Past Surgical History:  Procedure Laterality Date   CARDIAC CATHETERIZATION  07/05/2016   CARDIAC  CATHETERIZATION N/A 07/05/2016   Procedure: Left Heart Cath and Coronary Angiography;  Surgeon: Yates Decamp, MD;  Location: [facility] INVASIVE CV LAB;  Service: Cardiovascular;  Laterality: N/A;   CYST EXCISION Right    "wrist"   DILATION AND CURETTAGE OF UTERUS     FOOT SURGERY Bilateral    "corns removed"   LEFT HEART CATH AND CORONARY ANGIOGRAPHY N/A 12/24/2017   Procedure: LEFT HEART CATH AND CORONARY ANGIOGRAPHY;  Surgeon: Elder Negus, MD;  Location: [facility] INVASIVE CV LAB;  Service: Cardiovascular;  Laterality: N/A;   TUBAL LIGATION     Family History:  Family History  Problem Relation Age of Onset   Lung cancer Other    Congestive Heart Failure Other    Diabetes Other    Hypertension Other    Colon cancer Neg Hx    Colon polyps Neg Hx    Esophageal cancer Neg Hx    Stomach cancer Neg Hx    Rectal cancer Neg Hx    Family Psychiatric  History: None noted Social History:  Social History   Substance and Sexual Activity  Alcohol Use Yes   Alcohol/week: 7.0 standard drinks of alcohol   Types: 7 Cans of beer per week     Social History   Substance and Sexual Activity  Drug Use Yes   Types: Marijuana    Social History   Socioeconomic History   Marital status: Widowed    Spouse name: Not on file   Number of children: 2   Years of education: Not on file   Highest education level: Not on file  Occupational History   Occupation: disabled   Tobacco Use   Smoking status: Some Days    Current packs/day: 0.00    Average packs/day: 0.3 packs/day for 35.0 years (8.8 ttl pk-yrs)    Types: Cigarettes    Start date: 05/29/1984    Last attempt to quit: 05/30/2019    Years since quitting: 4.1   Smokeless tobacco: Never  Vaping Use   Vaping status: Never Used  Substance and Sexual Activity   Alcohol use: Yes    Alcohol/week: 7.0 standard drinks of alcohol    Types: 7 Cans of beer per week   Drug use: Yes    Types: Marijuana   Sexual activity: Yes    Birth control/protection:  Surgical  Other Topics Concern   Not on file  Social History Narrative   Not on file   Social Determinants of Health   Financial Resource Strain: Not on File (02/28/2022)   Received from [employer], [employer]    Financial Resource Strain: 0  Recent Concern: Physicist, medical Strain - Medium Risk (02/11/2022)   Received from [facility], [facility]   Overall Financial Resource Strain (CARDIA)    Difficulty of Paying Living Expenses: Somewhat hard  Food Insecurity: Not at Risk (04/15/2023)   Received from [place], [employer]  Food: 1  Transportation Needs: Not at Risk (04/15/2023)   Received from [place], [employer] Needs    Transportation: 1  Physical Activity: Not on File (02/28/2022)   Received from [place], [place]   Physical Activity    Physical Activity: 0  Stress: Not on File (02/28/2022)   Received from [facility], [place]   Stress    Stress: 0  Social Connections: Unknown (01/08/2023)   Received from [facility], [facility]   Social Network    Social Network: Not on file   Additional Social History: Patient lives with daughter  Allergies:   Allergies  Allergen Reactions   Morphine And Codeine Hives   Aspirin Hives   Food Hives    "Regular butter"   Lactose Hives    Butter is the only dairy product that she has a reaction to   Other Hives    Regular butter 05/30/2017 -   Tilactase     05/30/2017 -    BUTTER    Labs:  Results for orders placed or performed during the hospital encounter of 07/16/23 (from the past 48 hour(s))  Comprehensive metabolic panel     Status: Abnormal   Collection Time: 07/16/23 10:48 AM  Result Value Ref Range   Sodium 137 135 - 145 mmol/L   Potassium 3.5 3.5 - 5.1 mmol/L   Chloride 105 98 - 111 mmol/L   CO2 22 22 - 32 mmol/L   Glucose, Bld 107 (H) 70 - 99 mg/dL    Comment: Glucose reference range applies only to samples taken after fasting for at least 8 hours.   BUN 7 6 - 20 mg/dL    Creatinine, Ser 6.06 (H) 0.44 - 1.00 mg/dL   Calcium 9.1 8.9 - 30.1 mg/dL   Total Protein 7.6 6.5 - 8.1 g/dL   Albumin 3.9 3.5 - 5.0 g/dL   AST 16 15 - 41 U/L   ALT 17 0 - 44 U/L   Alkaline Phosphatase 57 38 - 126 U/L   Total Bilirubin 0.5 0.3 - 1.2 mg/dL   GFR, Estimated 55 (L) >60 mL/min    Comment: (NOTE) Calculated using the CKD-EPI Creatinine Equation (2021)    Anion gap 10 5 - 15    Comment: Performed at [facility], [address] [place], [place] [address]  Ethanol     Status: None   Collection Time: 07/16/23 10:48 AM  Result Value Ref Range   Alcohol, Ethyl (B) <10 <10 mg/dL    Comment: (NOTE) Lowest detectable limit for serum alcohol is 10 mg/dL.  For medical purposes only. Performed at [facility], [address] [place], [place] [address]   CBC     Status: Abnormal   Collection Time: 07/16/23 10:48 AM  Result Value Ref Range   WBC 7.3 4.0 - 10.5 K/uL   RBC 4.98 3.87 - 5.11 MIL/uL   Hemoglobin 13.2 12.0 - 15.0 g/dL   HCT 73.2 20.2 - 54.2 %   MCV 82.3 80.0 - 100.0 fL   MCH 26.5 26.0 - 34.0 pg   MCHC 32.2 30.0 - 36.0 g/dL   RDW 70.6 (H) 23.7 - 62.8 %   Platelets 339 150 - 400 K/uL   nRBC 0.0 0.0 - 0.2 %    Comment: Performed at [facility], [address], [place], [place] [address]  Urine rapid drug screen (hosp performed)     Status: Abnormal   Collection Time: 07/16/23 12:11 PM  Result Value Ref Range   Opiates NONE DETECTED NONE DETECTED   Cocaine POSITIVE (A) NONE DETECTED   Benzodiazepines NONE DETECTED NONE DETECTED   Amphetamines NONE DETECTED NONE DETECTED   Tetrahydrocannabinol NONE DETECTED NONE DETECTED   Barbiturates NONE DETECTED NONE DETECTED    Comment: (NOTE) DRUG SCREEN FOR MEDICAL PURPOSES ONLY.  IF CONFIRMATION IS NEEDED FOR ANY PURPOSE, NOTIFY LAB WITHIN 5 DAYS.  LOWEST DETECTABLE LIMITS FOR URINE DRUG SCREEN Drug Class                     Cutoff (ng/mL) Amphetamine and  metabolites    1000 Barbiturate and metabolites    200 Benzodiazepine                 200 Opiates and metabolites        300 Cocaine and metabolites        300 THC                            50 Performed at [facility], [address] [place], [place] [address]     No current facility-administered medications for this encounter.   Current Outpatient Medications  Medication Sig Dispense Refill   albuterol (VENTOLIN HFA) 108 (90 Base) MCG/ACT inhaler Inhale 1-2 puffs into the lungs every 6 (six) hours as needed for wheezing or shortness of breath. 1 each 0   fluticasone (FLONASE) 50 MCG/ACT nasal spray Place 1 spray into both nostrils daily.     melatonin 5 MG TABS Take 1 tablet (5 mg total) by mouth at bedtime as needed. 30 tablet 0   metroNIDAZOLE (FLAGYL) 500 MG tablet Take 500 mg by mouth 2 (two) times daily.     nystatin cream (MYCOSTATIN) Apply topically 2 (two) times daily. 30 g 0   pantoprazole (PROTONIX) 40 MG tablet Take 1 tablet (40 mg total) by mouth every morning. 30 tablet 0    Musculoskeletal: Strength & Muscle Tone: within normal limits Gait & Station: normal Patient leans: N/A   Psychiatric Specialty Exam: Presentation  General Appearance:  Disheveled  Eye Contact: Good  Speech: Clear and Coherent  Speech Volume: Normal  Handedness: Right   Mood and Affect  Mood: Depressed  Affect: Congruent   Thought Process  Thought Processes: Coherent  Descriptions of Associations:Intact  Orientation:Full (Time, Place and Person)  Thought Content:WDL  History of Schizophrenia/Schizoaffective disorder:No data recorded Duration of Psychotic Symptoms:No data recorded Hallucinations:Hallucinations: None  Ideas of Reference:None  Suicidal Thoughts:Suicidal Thoughts: Yes, Passive SI Passive Intent and/or Plan: Without Intent; Without Plan  Homicidal Thoughts:Homicidal Thoughts: No   Sensorium  Memory: Immediate Good;  Recent Good; Remote Good  Judgment: Impaired  Insight: Lacking   Executive Functions  Concentration: Fair  Attention Span: Fair  Recall: Good  Fund of Knowledge: Fair  Language: Good   Psychomotor Activity  Psychomotor Activity: Psychomotor Activity: Normal   Assets  Assets: Desire for Improvement; Social Support; Leisure Time    Sleep  Sleep: Sleep: Poor Number of Hours of Sleep: 0   Physical Exam: Physical Exam Vitals and nursing note reviewed.  Eyes:     Pupils: Pupils are equal, round, and reactive to light.  Pulmonary:     Effort: Pulmonary effort is normal.  Skin:    General: Skin is dry.  Neurological:     Mental Status: She is alert and oriented to person, place, and time.  Review of Systems  Psychiatric/Behavioral:  Positive for depression, substance abuse and suicidal ideas.   All other systems reviewed and are negative.  Blood pressure (!) 126/100, pulse 85, temperature 98 F (36.7 C), temperature source Oral, resp. rate 16, height 5\' 8"  (1.727 m), weight 91.2 kg, SpO2 96%. Body mass index is 30.57 kg/m.  Medical Decision Making: Patient case reviewed and discussed with Dr Lucianne Muss. Patient is a danger to herself and meets criteria for inpatient psychiatric hospitalization for stabilization and treatment for suicidal ideation and substance abuse. Recommend inpatient treatment.  Problem 1: Suicidal ideation -Prozac 20mg  PO Q day -Trazodone 100mg  PO Q HS  Problem 2: Polysubstance abuse -CIWA  Disposition: Recommend inpatient psychiatric hospitalization for stabilization and treatment  Thomes Lolling, NP 07/16/2023 3:55 PM

## 2023-07-16 NOTE — ED Notes (Signed)
Pt belongings was placed in cabinet 16-18. Pt had a black wallet over the shoulder type of thing, a bag of clothes in a [employer] bag and her phone. Pt is changed in burgundy scrubs.

## 2023-07-16 NOTE — ED Notes (Signed)
 Pt. Was given dinner tray.
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
## 2023-07-16 NOTE — ED Notes (Signed)
Belongings moved to locker 28 in [place]
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
## 2023-07-16 NOTE — ED Notes (Signed)
Patient to room 28. Patient alert and ambulated to room.  Patient oriented to unit and room.

## 2023-07-17 ENCOUNTER — Encounter: Payer: Self-pay | Admitting: Psychiatry

## 2023-07-17 DIAGNOSIS — F4310 Post-traumatic stress disorder, unspecified: Secondary | ICD-10-CM | POA: Diagnosis present

## 2023-07-17 DIAGNOSIS — F3130 Bipolar disorder, current episode depressed, mild or moderate severity, unspecified: Secondary | ICD-10-CM

## 2023-07-17 LAB — LIPID PANEL
Cholesterol: 166 mg/dL (ref 0–200)
HDL: 33 mg/dL — ABNORMAL LOW (ref >40)
LDL Cholesterol: 92 mg/dL (ref 0–99)
Total CHOL/HDL Ratio: 5 ratio
Triglycerides: 203 mg/dL — ABNORMAL HIGH (ref <150)
VLDL: 41 mg/dL — ABNORMAL HIGH (ref 0–40)

## 2023-07-17 MED ORDER — PANTOPRAZOLE SODIUM 40 MG PO TBEC
40.0000 mg | DELAYED_RELEASE_TABLET | Freq: Every day | ORAL | Status: DC
  Administered 2023-07-18 – 2023-07-24 (×7): 40 mg via ORAL
  Filled 2023-07-17 (×8): qty 1

## 2023-07-17 MED ORDER — LURASIDONE HCL 40 MG PO TABS
20.0000 mg | ORAL_TABLET | Freq: Every day | ORAL | Status: DC
  Administered 2023-07-18 – 2023-07-24 (×7): 20 mg via ORAL
  Filled 2023-07-17 (×7): qty 1

## 2023-07-17 MED ORDER — ONDANSETRON 4 MG PO TBDP: 4.0000 mg | ORAL_TABLET | Freq: Two times a day (BID) | ORAL | Status: DC | PRN

## 2023-07-17 MED ORDER — PRAZOSIN HCL 1 MG PO CAPS
1.0000 mg | ORAL_CAPSULE | Freq: Every day | ORAL | Status: DC
  Administered 2023-07-17 – 2023-07-23 (×7): 1 mg via ORAL
  Filled 2023-07-17 (×7): qty 1

## 2023-07-17 MED ORDER — PHENYLEPHRINE-MINERAL OIL-PET 0.25-14-74.9 % RE OINT: 1.0000 | TOPICAL_OINTMENT | Freq: Two times a day (BID) | RECTAL | Status: DC | PRN

## 2023-07-17 MED ORDER — METHOCARBAMOL 500 MG PO TABS
500.0000 mg | ORAL_TABLET | Freq: Four times a day (QID) | ORAL | Status: DC | PRN
  Administered 2023-07-18 – 2023-07-20 (×4): 500 mg via ORAL
  Filled 2023-07-17 (×4): qty 1

## 2023-07-17 NOTE — Progress Notes (Signed)
Pt denies SI/HI/AVH and verbally agrees to approach staff if these become apparent or before harming themselves/others. Pt did not participate in group, or leave room during shift. Pt denies anxiety and depression, but appears withdrawn and depressed in affect. Scheduled medications administered to pt, per MD orders. RN provided support and encouragement to pt. Q15 min safety checks implemented and continued. Pt is safe on the unit. Plan of care on going and no other concerns expressed at this time.

## 2023-07-17 NOTE — [facility] Suicide Risk Assessment (Signed)
[facility] INPATIENT:  Family/Significant Other Suicide Prevention Education  Suicide Prevention Education:  Patient Refusal for Family/Significant Other Suicide Prevention Education: The patient Denise Knight has refused to provide written consent for family/significant other to be provided Family/Significant Other Suicide Prevention Education during admission and/or prior to discharge.  Physician notified.  Harden Mo 07/17/2023, 1:10 PM

## 2023-07-17 NOTE — Group Note (Signed)
Recreation Therapy Group Note   Group Topic:Coping Skills  Group Date: 07/17/2023 Start Time: 1000 End Time: 1055 Facilitators: Rosina Lowenstein, LRT, CTRS Location:  Craft Room  Group Description: Mind Map.  Patient was provided a blank template of a diagram with 32 blank boxes in a tiered system, branching from the center (similar to a bubble chart). LRT directed patients to label the middle of the diagram "Coping Skills". LRT and patients then came up with 8 different coping skills as examples. Pt were directed to record their coping skills in the 2nd tier boxes closest to the center.  Patients would then share their coping skills with the group as LRT wrote them out. LRT gave a handout of 99 different coping skills at the end of group.    Goal Area(s) Addressed: Patients will be able to define "coping skills". Patient will identify new coping skills.  Patient will increase communication.    Affect/Mood: N/A   Participation Level: Did not attend    Clinical Observations/Individualized Feedback: Denise Knight did not attend group.  Plan: Continue to engage patient in RT group sessions 2-3x/week.   Rosina Lowenstein, LRT, CTRS 07/17/2023 11:10 AM

## 2023-07-17 NOTE — Plan of Care (Signed)
New admission over night.  Problem: Education: Goal: Knowledge of General Education information will improve Description: Including pain rating scale, medication(s)/side effects and non-pharmacologic comfort measures Outcome: Not Progressing   Problem: Health Behavior/Discharge Planning: Goal: Ability to manage health-related needs will improve Outcome: Not Progressing   Problem: Clinical Measurements: Goal: Ability to maintain clinical measurements within normal limits will improve Outcome: Not Progressing Goal: Will remain free from infection Outcome: Not Progressing Goal: Diagnostic test results will improve Outcome: Not Progressing Goal: Respiratory complications will improve Outcome: Not Progressing Goal: Cardiovascular complication will be avoided Outcome: Not Progressing   Problem: Activity: Goal: Risk for activity intolerance will decrease Outcome: Not Progressing   Problem: Nutrition: Goal: Adequate nutrition will be maintained Outcome: Not Progressing   Problem: Coping: Goal: Level of anxiety will decrease Outcome: Not Progressing   Problem: Elimination: Goal: Will not experience complications related to bowel motility Outcome: Not Progressing Goal: Will not experience complications related to urinary retention Outcome: Not Progressing   Problem: Pain Managment: Goal: General experience of comfort will improve Outcome: Not Progressing   Problem: Safety: Goal: Ability to remain free from injury will improve Outcome: Not Progressing   Problem: Skin Integrity: Goal: Risk for impaired skin integrity will decrease Outcome: Not Progressing   Problem: Education: Goal: Knowledge of [place] General Education information/materials will improve Outcome: Not Progressing Goal: Emotional status will improve Outcome: Not Progressing Goal: Mental status will improve Outcome: Not Progressing Goal: Verbalization of understanding the information provided will  improve Outcome: Not Progressing   Problem: Activity: Goal: Interest or engagement in activities will improve Outcome: Not Progressing Goal: Sleeping patterns will improve Outcome: Not Progressing   Problem: Coping: Goal: Ability to verbalize frustrations and anger appropriately will improve Outcome: Not Progressing Goal: Ability to demonstrate self-control will improve Outcome: Not Progressing   Problem: Health Behavior/Discharge Planning: Goal: Identification of resources available to assist in meeting health care needs will improve Outcome: Not Progressing Goal: Compliance with treatment plan for underlying cause of condition will improve Outcome: Not Progressing   Problem: Physical Regulation: Goal: Ability to maintain clinical measurements within normal limits will improve Outcome: Not Progressing   Problem: Safety: Goal: Periods of time without injury will increase Outcome: Not Progressing

## 2023-07-17 NOTE — Tx Team (Signed)
Initial Treatment Plan 07/17/2023 12:03 AM [name] ZOX:[id number]    PATIENT STRESSORS: Medication change or noncompliance   Substance abuse     PATIENT STRENGTHS: Motivation for treatment/growth  Supportive family/friends    PATIENT IDENTIFIED PROBLEMS: Substance Abuse  Depression  Anxiety  Suicidal Ideation               DISCHARGE CRITERIA:  Improved stabilization in mood, thinking, and/or behavior Verbal commitment to aftercare and medication compliance  PRELIMINARY DISCHARGE PLAN: Outpatient therapy Return to previous living arrangement  PATIENT/FAMILY INVOLVEMENT: This treatment plan has been presented to and reviewed with the patient, Denise Knight.  The patient has been given the opportunity to ask questions and make suggestions.  Elmyra Ricks, RN 07/17/2023, 12:03 AM

## 2023-07-17 NOTE — Group Note (Signed)
Date:  07/17/2023 Time:  9:52 PM  Group Topic/Focus:  Wrap-Up Group:   The focus of this group is to help patients review their daily goal of treatment and discuss progress on daily workbooks.    Participation Level:  Did Not Attend  Participation Quality:   none  Affect:   none  Cognitive:   none  Insight: None  Engagement in Group:   none  Modes of Intervention:   none  Additional Comments:     Tobias Avitabile 07/17/2023, 9:52 PM

## 2023-07-17 NOTE — Group Note (Signed)
[facility] LCSW Group Therapy Note   Group Date: 07/17/2023 Start Time: 1315 End Time: 1415   Type of Therapy/Topic:  Group Therapy:  Balance in Life  Participation Level:  Did Not Attend   Description of Group:    This group will address the concept of balance and how it feels and looks when one is unbalanced. Patients will be encouraged to process areas in their lives that are out of balance, and identify reasons for remaining unbalanced. Facilitators will guide patients utilizing problem- solving interventions to address and correct the stressor making their life unbalanced. Understanding and applying boundaries will be explored and addressed for obtaining  and maintaining a balanced life. Patients will be encouraged to explore ways to assertively make their unbalanced needs known to significant others in their lives, using other group members and facilitator for support and feedback.  Therapeutic Goals: Patient will identify two or more emotions or situations they have that consume much of in their lives. Patient will identify signs/triggers that life has become out of balance:  Patient will identify two ways to set boundaries in order to achieve balance in their lives:  Patient will demonstrate ability to communicate their needs through discussion and/or role plays  Summary of Patient Progress: X   Therapeutic Modalities:   Cognitive Behavioral Therapy Solution-Focused Therapy Assertiveness Training   Glenis Smoker, LCSW
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
## 2023-07-17 NOTE — Group Note (Signed)
Date:  07/17/2023 Time:  5:49 PM  Group Topic/Focus:  Activity/Outdoor Recreation    Participation Level:  Did Not Attend   Lynelle Smoke [facility] 07/17/2023, 5:49 PM

## 2023-07-17 NOTE — Progress Notes (Signed)
Suicide Risk Assessment  Admission Assessment    [facility] Admission Suicide Risk Assessment   Nursing information obtained from:  Patient Demographic factors:  Unemployed, Low socioeconomic status Current Mental Status:  Suicidal ideation indicated by patient, Suicidal ideation indicated by others Loss Factors:  NA Historical Factors:  Impulsivity Risk Reduction Factors:  Living with another person, especially a relative  Total Time spent with patient: 1 hour Principal Problem: Suicidal ideations Diagnosis:  Principal Problem:   Suicidal ideations Active Problems:   Polysubstance abuse (HCC)   Bipolar I disorder, most recent episode depressed (HCC)   PTSD (post-traumatic stress disorder)  Subjective Data: 57 y/o female admitted to inpatient psychiatry as a transfer from [facility] for suicidal ideation and crack/cocaine abuse. Pt reports she was admitted because "I guess it was the drugs".   Continued Clinical Symptoms:  Alcohol Use Disorder Identification Test Final Score (AUDIT): 7 The "Alcohol Use Disorders Identification Test", Guidelines for Use in Primary Care, Second Edition.  World Science writer [facility]). Score between 0-7:  no or low risk or alcohol related problems. Score between 8-15:  moderate risk of alcohol related problems. Score between 16-19:  high risk of alcohol related problems. Score 20 or above:  warrants further diagnostic evaluation for alcohol dependence and treatment.   CLINICAL FACTORS:   Bipolar Disorder:   Depressive phase Alcohol/Substance Abuse/Dependencies Previous Psychiatric Diagnoses and Treatments   Musculoskeletal: Strength & Muscle Tone: within normal limits Gait & Station: normal Patient leans: N/A  Psychiatric Specialty Exam:  Presentation  General Appearance:  Disheveled  Eye Contact: Fair  Speech: Clear and Coherent; Normal Rate  Speech Volume: Normal  Handedness: Right   Mood and Affect   Mood: Depressed  Affect: Flat   Thought Process  Thought Processes: Coherent; Goal Directed; Linear  Descriptions of Associations:Intact  Orientation:Full (Time, Place and Person)  Thought Content:Logical  History of Schizophrenia/Schizoaffective disorder: No Duration of Psychotic Symptoms:Not applicable Hallucinations:Hallucinations: None  Ideas of Reference:None  Suicidal Thoughts:Suicidal Thoughts: Yes, Passive SI Passive Intent and/or Plan: With Plan  Homicidal Thoughts:Homicidal Thoughts: No   Sensorium  Memory: Immediate Good; Recent Good; Remote Good  Judgment: Intact  Insight: Present   Executive Functions  Concentration: Fair  Attention Span: Fair  Recall: Fair  Fund of Knowledge: Good  Language: Good   Psychomotor Activity  Psychomotor Activity: Psychomotor Activity: Normal   Assets  Assets: Communication Skills; Desire for Improvement; Financial Resources/Insurance; Housing; Leisure Time; Resilience; Social Support   Sleep  Sleep: Sleep: Poor Number of Hours of Sleep: 0    Physical Exam: Physical Exam see admission ROS see admission Blood pressure 110/89, pulse 86, temperature (!) 97.2 F (36.2 C), resp. rate 19, SpO2 100%. There is no height or weight on file to calculate BMI.   COGNITIVE FEATURES THAT CONTRIBUTE TO RISK:  None    SUICIDE RISK:   Moderate:  Frequent suicidal ideation with limited intensity, and duration, some specificity in terms of plans, no associated intent, good self-control, limited dysphoria/symptomatology, some risk factors present, and identifiable protective factors, including available and accessible social support.  PLAN OF CARE:  Daily contact with patient to assess and evaluate symptoms and progress in treatment Medication management  I certify that inpatient services furnished can reasonably be expected to improve the patient's condition.   Lauree Chandler, NP 07/17/2023, 4:15  PM
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
## 2023-07-17 NOTE — Progress Notes (Signed)
Pt admitted from [facility], ED, report received from [place], [place]. Pt got out of the safe transport vehicle and vomited, urinated and defecated on herself. Pt stated she was having sharp pain in her Right, Lower, Abdomen. Pt stated it just happened right when she got here. Pt stated she didn't think she needed any medications and wanted to just rest. Pt given education and support. Pt informed to let staff know if the pain gets worse at all. Pt agrees. Pt oriented to her room. Pt skin assessment completed with Marylu Lund, RN. Pt has healing scabs to her left and right knee. Pt being monitored Q 15 minutes for safety per unit protocol, remains safe on the unit

## 2023-07-17 NOTE — Plan of Care (Signed)
Patient new to the unit tonight, hasn't had time to progress  Problem: Education: Goal: Knowledge of General Education information will improve Description: Including pain rating scale, medication(s)/side effects and non-pharmacologic comfort measures Outcome: Not Progressing   Problem: Health Behavior/Discharge Planning: Goal: Ability to manage health-related needs will improve Outcome: Not Progressing   Problem: Clinical Measurements: Goal: Ability to maintain clinical measurements within normal limits will improve Outcome: Not Progressing Goal: Will remain free from infection Outcome: Not Progressing Goal: Diagnostic test results will improve Outcome: Not Progressing Goal: Respiratory complications will improve Outcome: Not Progressing Goal: Cardiovascular complication will be avoided Outcome: Not Progressing   Problem: Activity: Goal: Risk for activity intolerance will decrease Outcome: Not Progressing   Problem: Nutrition: Goal: Adequate nutrition will be maintained Outcome: Not Progressing   Problem: Coping: Goal: Level of anxiety will decrease Outcome: Not Progressing   Problem: Elimination: Goal: Will not experience complications related to bowel motility Outcome: Not Progressing Goal: Will not experience complications related to urinary retention Outcome: Not Progressing   Problem: Pain Managment: Goal: General experience of comfort will improve Outcome: Not Progressing   Problem: Safety: Goal: Ability to remain free from injury will improve Outcome: Not Progressing   Problem: Skin Integrity: Goal: Risk for impaired skin integrity will decrease Outcome: Not Progressing   Problem: Education: Goal: Knowledge of [place] [facility] Education information/materials will improve Outcome: Not Progressing Goal: Emotional status will improve Outcome: Not Progressing Goal: Mental status will improve Outcome: Not Progressing Goal: Verbalization of  understanding the information provided will improve Outcome: Not Progressing   Problem: Activity: Goal: Interest or engagement in activities will improve Outcome: Not Progressing Goal: Sleeping patterns will improve Outcome: Not Progressing   Problem: Coping: Goal: Ability to verbalize frustrations and anger appropriately will improve Outcome: Not Progressing Goal: Ability to demonstrate self-control will improve Outcome: Not Progressing   Problem: Health Behavior/Discharge Planning: Goal: Identification of resources available to assist in meeting health care needs will improve Outcome: Not Progressing Goal: Compliance with treatment plan for underlying cause of condition will improve Outcome: Not Progressing   Problem: Physical Regulation: Goal: Ability to maintain clinical measurements within normal limits will improve Outcome: Not Progressing   Problem: Safety: Goal: Periods of time without injury will increase Outcome: Not Progressing

## 2023-07-17 NOTE — Progress Notes (Signed)
D- Patient alert and oriented. Patient initially presented in a pleasant mood on assessment, prior to breakfast, and afterwards has mostly been in bed sleeping. Patient stated that she slept "ok" last night and had no complaints to voice to this writer, stating that the PRN pain medication she received prior to this writer's shift, was helpful. Patient denied anxiety, but endorsed "a little bit" of depression. However, she did not go into detail as to why she's feeling this way. Patient also denied SI, HI, AVH at this time. Patient had no stated goals for today.  A- Scheduled medications administered to patient, per MD orders. Support and encouragement provided.  Routine safety checks conducted every 15 minutes.  Patient informed to notify staff with problems or concerns.  R- No adverse drug reactions noted. Patient contracts for safety at this time. Patient compliant with medications, but has been too drowsy to attend unit groups today. Patient receptive, calm, and cooperative. Patient remains safe at this time.   07/17/23 1300  Psych Admission Type (Psych Patients Only)  Admission Status Voluntary  Psychosocial Assessment  Patient Complaints Depression  Eye Contact Brief  Facial Expression Other (Comment) (appropriate)  Affect Appropriate to circumstance  Speech Logical/coherent  Interaction Assertive  Motor Activity Slow  Appearance/Hygiene Unremarkable  Behavior Characteristics Cooperative;Appropriate to situation  Mood Pleasant  Aggressive Behavior  Effect No apparent injury  Thought Process  Coherency Circumstantial  Content WDL  Delusions None reported or observed  Perception WDL  Hallucination None reported or observed  Judgment WDL  Confusion None  Danger to Self  Current suicidal ideation? Denies  Danger to Others  Danger to Others None reported or observed  Danger to Others Abnormal  Harmful Behavior to others No threats or harm toward other people  Destructive  Behavior No threats or harm toward property

## 2023-07-17 NOTE — Group Note (Signed)
Date:  07/17/2023 Time:  10:07 AM  Group Topic/Focus:  Goals Group:   The focus of this group is to help patients establish daily goals to achieve during treatment and discuss how the patient can incorporate goal setting into their daily lives to aide in recovery.    Participation Level:  Did Not Attend   Lynelle Smoke [facility] 07/17/2023, 10:07 AM

## 2023-07-18 DIAGNOSIS — F314 Bipolar disorder, current episode depressed, severe, without psychotic features: Principal | ICD-10-CM

## 2023-07-18 LAB — HEMOGLOBIN A1C
Hgb A1c MFr Bld: 5.8 % — ABNORMAL HIGH (ref 4.8–5.6)
Mean Plasma Glucose: 119.76 mg/dL

## 2023-07-18 NOTE — Plan of Care (Signed)
  Problem: Education: Goal: Knowledge of General Education information will improve Description: Including pain rating scale, medication(s)/side effects and non-pharmacologic comfort measures Outcome: Progressing   Problem: Clinical Measurements: Goal: Will remain free from infection Outcome: Progressing   Problem: Coping: Goal: Level of anxiety will decrease Outcome: Not Progressing

## 2023-07-18 NOTE — Progress Notes (Signed)
[facility] MD Progress Note  07/18/2023 4:22 PM Denise Knight  MRN:  [id number]  Subjective:  57 yo female admitted with suicidal ideations and cocaine abuse.  In treatment team, her goal is to go to rehab at [facility] for cocaine use d/o.  She has been sleeping most of the day despite sleeping last night, consistent with recent cocaine use with over need to sleep after use.  Moderate depression and anxiety, no suicidal ideations.  Appetite is good.  Denies side effects from her medications.  Principal Problem: Bipolar affective disorder, depressed, severe (HCC) Diagnosis: Principal Problem:   Bipolar affective disorder, depressed, severe (HCC) Active Problems:   Polysubstance abuse (HCC)   PTSD (post-traumatic stress disorder)  Total Time spent with patient: 30 minutes  Past Psychiatric History: bipolar d/o, PTSD, substance abuse  Past Medical History:  Past Medical History:  Diagnosis Date   Allergy    seasonal   Anxiety    Arthritis    "left knee" (07/05/2016)   Asthma    Bipolar 1 disorder (HCC)    Depression    Diabetes mellitus without complication (HCC)    pre-   GERD (gastroesophageal reflux disease)    Hypertension    MI (mitral incompetence)    Post traumatic stress disorder    Schizophrenia (HCC)    Stomach ulcer    Substance abuse (HCC)    cocaine quit july 2020   Suicide attempt [facility])     Past Surgical History:  Procedure Laterality Date   CARDIAC CATHETERIZATION  07/05/2016   CARDIAC CATHETERIZATION N/A 07/05/2016   Procedure: Left Heart Cath and Coronary Angiography;  Surgeon: Yates Decamp, MD;  Location: [facility] INVASIVE CV LAB;  Service: Cardiovascular;  Laterality: N/A;   CYST EXCISION Right    "wrist"   DILATION AND CURETTAGE OF UTERUS     FOOT SURGERY Bilateral    "corns removed"   LEFT HEART CATH AND CORONARY ANGIOGRAPHY N/A 12/24/2017   Procedure: LEFT HEART CATH AND CORONARY ANGIOGRAPHY;  Surgeon: Elder Negus, MD;  Location: [facility]  INVASIVE CV LAB;  Service: Cardiovascular;  Laterality: N/A;   TUBAL LIGATION     Family History:  Family History  Problem Relation Age of Onset   Lung cancer Other    Congestive Heart Failure Other    Diabetes Other    Hypertension Other    Colon cancer Neg Hx    Colon polyps Neg Hx    Esophageal cancer Neg Hx    Stomach cancer Neg Hx    Rectal cancer Neg Hx    Family Psychiatric  History: none Social History:  Social History   Substance and Sexual Activity  Alcohol Use Yes   Alcohol/week: 7.0 standard drinks of alcohol   Types: 7 Cans of beer per week     Social History   Substance and Sexual Activity  Drug Use Yes   Types: Marijuana    Social History   Socioeconomic History   Marital status: Widowed    Spouse name: Not on file   Number of children: 2   Years of education: Not on file   Highest education level: Not on file  Occupational History   Occupation: disabled   Tobacco Use   Smoking status: Some Days    Current packs/day: 0.00    Average packs/day: 0.3 packs/day for 35.0 years (8.8 ttl pk-yrs)    Types: Cigarettes    Start date: 05/29/1984    Last attempt to quit: 05/30/2019  Years since quitting: 4.1   Smokeless tobacco: Never  Vaping Use   Vaping status: Never Used  Substance and Sexual Activity   Alcohol use: Yes    Alcohol/week: 7.0 standard drinks of alcohol    Types: 7 Cans of beer per week   Drug use: Yes    Types: Marijuana   Sexual activity: Yes    Birth control/protection: Surgical  Other Topics Concern   Not on file  Social History Narrative   Not on file   Social Determinants of Health   Financial Resource Strain: Not on File (02/28/2022)   Received from [facility], [place] Strain    Financial Resource Strain: 0  Recent Concern: Physicist, medical Strain - Medium Risk (02/11/2022)   Received [facility], [facility]   Overall Financial Resource Strain (CARDIA)    Difficulty of Paying Living  Expenses: Somewhat hard  Food Insecurity: No Food Insecurity (07/17/2023)   Hunger Vital Sign    Worried About Running Out of Food in the Last Year: Never true    Ran Out of Food in the Last Year: Never true  Transportation Needs: No Transportation Needs (07/17/2023)   PRAPARE - Administrator, Civil Service (Medical): No    Lack of Transportation (Non-Medical): No  Physical Activity: Not on File (02/28/2022)   Received from [place], [place]   Physical Activity    Physical Activity: 0  Stress: Not on File (02/28/2022)   Received from [facility], [place]   Stress    Stress: 0  Social Connections: Not on File (07/16/2023)   Received from [facility]   Social Connections    Connectedness: 0   Additional Social History:       Sleep: Good  Appetite:  Good  Current Medications: Current Facility-Administered Medications  Medication Dose Route Frequency Provider Last Rate Last Admin   acetaminophen (TYLENOL) tablet 650 mg  650 mg Oral Q6H PRN Weber, Kyra A, NP   650 mg at 07/17/23 0639   albuterol (VENTOLIN HFA) 108 (90 Base) MCG/ACT inhaler 1-2 puff  1-2 puff Inhalation Q6H PRN Weber, Kyra A, NP       alum & mag hydroxide-simeth (MAALOX/MYLANTA) 200-200-20 MG/5ML suspension 30 mL  30 mL Oral Q4H PRN Weber, Kyra A, NP       diphenhydrAMINE (BENADRYL) capsule 50 mg  50 mg Oral TID PRN Weber, Kyra A, NP       Or   diphenhydrAMINE (BENADRYL) injection 50 mg  50 mg Intramuscular TID PRN Weber, Kyra A, NP       fluticasone (FLONASE) 50 MCG/ACT nasal spray 1 spray  1 spray Each Nare Daily Weber, Kyra A, NP       folic acid (FOLVITE) tablet 1 mg  1 mg Oral Daily Weber, Kyra A, NP   1 mg at 07/18/23 8295   haloperidol (HALDOL) tablet 5 mg  5 mg Oral TID PRN Weber, Kyra A, NP       Or   haloperidol lactate (HALDOL) injection 5 mg  5 mg Intramuscular TID PRN Weber, Kyra A, NP       LORazepam (ATIVAN) tablet 2 mg  2 mg Oral TID PRN Weber, Kyra A, NP       Or   LORazepam (ATIVAN) injection 2 mg  2 mg  Intramuscular TID PRN Weber, Kyra A, NP       LORazepam (ATIVAN) tablet 1-4 mg  1-4 mg Oral Q1H PRN Weber, Leavy Cella, NP  1 mg at 07/18/23 1610   lurasidone (LATUDA) tablet 20 mg  20 mg Oral Q breakfast Lauree Chandler, NP   20 mg at 07/18/23 9604   magnesium hydroxide (MILK OF MAGNESIA) suspension 30 mL  30 mL Oral Daily PRN Phebe Colla A, NP       methocarbamol (ROBAXIN) tablet 500 mg  500 mg Oral Q6H PRN Lauree Chandler, NP   500 mg at 07/18/23 5409   multivitamin with minerals tablet 1 tablet  1 tablet Oral Daily Weber, Kyra A, NP   1 tablet at 07/18/23 0821   ondansetron (ZOFRAN-ODT) disintegrating tablet 4 mg  4 mg Oral Q12H PRN Lauree Chandler, NP       pantoprazole (PROTONIX) EC tablet 40 mg  40 mg Oral Q breakfast Lauree Chandler, NP   40 mg at 07/18/23 0601   phenylephrine-shark liver oil-mineral oil-petrolatum (PREPARATION H) rectal ointment 1 Application  1 Application Rectal BID PRN Lauree Chandler, NP       prazosin (MINIPRESS) capsule 1 mg  1 mg Oral QHS Lauree Chandler, NP   1 mg at 07/17/23 2113   thiamine (VITAMIN B1) tablet 100 mg  100 mg Oral Daily Weber, Kyra A, NP   100 mg at 07/18/23 8119   traZODone (DESYREL) tablet 100 mg  100 mg Oral QHS Weber, Kyra A, NP   100 mg at 07/17/23 2113    Lab Results:  Results for orders placed or performed during the hospital encounter of 07/16/23 (from the past 48 hour(s))  Lipid panel     Status: Abnormal   Collection Time: 07/17/23 12:49 PM  Result Value Ref Range   Cholesterol 166 0 - 200 mg/dL   Triglycerides 147 (H) <150 mg/dL   HDL 33 (L) >82 mg/dL   Total CHOL/HDL Ratio 5.0 RATIO   VLDL 41 (H) 0 - 40 mg/dL   LDL Cholesterol 92 0 - 99 mg/dL    Comment:        Total Cholesterol/HDL:CHD Risk Coronary Heart Disease Risk Table                     Men   Women  1/2 Average Risk   3.4   3.3  Average Risk       5.0   4.4  2 X Average Risk   9.6   7.1  3 X Average Risk  23.4   11.0        Use the calculated  Patient Ratio above and the CHD Risk Table to determine the patient's CHD Risk.        ATP III CLASSIFICATION (LDL):  <100     mg/dL   Optimal  956-213  mg/dL   Near or Above                    Optimal  130-159  mg/dL   Borderline  086-578  mg/dL   High  >469     mg/dL   Very High Performed at [facility], [address] [place], [place] [address]   Hemoglobin A1c     Status: Abnormal   Collection Time: 07/17/23 12:49 PM  Result Value Ref Range   Hgb A1c MFr Bld 5.8 (H) 4.8 - 5.6 %    Comment: (NOTE) Pre diabetes:          5.7%-6.4%  Diabetes:              >  6.4%  Glycemic control for   <7.0% adults with diabetes    Mean Plasma Glucose 119.76 mg/dL    Comment: Performed at [facility], [address], [place], [place] [address]    Blood Alcohol level:  Lab Results  Component Value Date   [facility] <10 07/16/2023   ETH <10 01/12/2023    Metabolic Disorder Labs: Lab Results  Component Value Date   HGBA1C 5.8 (H) 07/17/2023   MPG 119.76 07/17/2023   MPG 116.89 09/03/2021   No results found for: "PROLACTIN" Lab Results  Component Value Date   CHOL 166 07/17/2023   TRIG 203 (H) 07/17/2023   HDL 33 (L) 07/17/2023   CHOLHDL 5.0 07/17/2023   VLDL 41 (H) 07/17/2023   LDLCALC 92 07/17/2023   LDLCALC 146 (H) 09/03/2021     Musculoskeletal: Strength & Muscle Tone: within normal limits Gait & Station: normal Patient leans: N/A  Psychiatric Specialty Exam: Physical Exam Vitals and nursing note reviewed.  Constitutional:      Appearance: Normal appearance.  HENT:     Head: Normocephalic.     Nose: Nose normal.  Pulmonary:     Effort: Pulmonary effort is normal.  Musculoskeletal:        General: Normal range of motion.     Cervical back: Normal range of motion.  Neurological:     General: No focal deficit present.     Mental Status: She is alert and oriented to person, place, and time.     Review of Systems  Psychiatric/Behavioral:  Positive  for depression and substance abuse. The patient is nervous/anxious.   All other systems reviewed and are negative.   Blood pressure 112/77, pulse 97, temperature (!) 97.3 F (36.3 C), resp. rate 18, SpO2 99%.There is no height or weight on file to calculate BMI.  General Appearance: Disheveled  Eye Contact:  Fair  Speech:  Clear and Coherent  Volume:  Normal  Mood:  Anxious and Depressed  Affect:  Congruent  Thought Process:  Coherent  Orientation:  Full (Time, Place, and Person)  Thought Content:  Logical  Suicidal Thoughts:  No  Homicidal Thoughts:  No  Memory:  Immediate;   Fair Recent;   Fair Remote;   Fair  Judgement:  Fair  Insight:  Fair  Psychomotor Activity:  Decreased  Concentration:  Concentration: Fair and Attention Span: Fair  Recall:  [employer] of Knowledge:  Fair  Language:  Good  Akathisia:  No  Handed:  Right  AIMS (if indicated):     Assets:  Leisure Time Physical Health Resilience  ADL's:  Intact  Cognition:  WNL  Sleep:          Physical Exam: Physical Exam Vitals and nursing note reviewed.  Constitutional:      Appearance: Normal appearance.  HENT:     Head: Normocephalic.     Nose: Nose normal.  Pulmonary:     Effort: Pulmonary effort is normal.  Musculoskeletal:        General: Normal range of motion.     Cervical back: Normal range of motion.  Neurological:     General: No focal deficit present.     Mental Status: She is alert and oriented to person, place, and time.    Review of Systems  Psychiatric/Behavioral:  Positive for depression and substance abuse. The patient is nervous/anxious.   All other systems reviewed and are negative.  Blood pressure 112/77, pulse 97, temperature (!) 97.3 F (36.3 C),  resp. rate 18, SpO2 99%. There is no height or weight on file to calculate BMI.   Treatment Plan Summary: Daily contact with patient to assess and evaluate symptoms and progress in treatment, Medication management, and Plan      Bipolar 1 disorder -Latuda 20mg  oral daily with breakfast Lipid panel with HDL 33 L, and triglycerides 203 H and VLDL 41 H A1C 5.8  TSH ordered, EKG completed in the ED   PTSD -Minipress 1mg  oral daily at bedtime   Alcohol withdrawal -CIWA with PRN ativan   Insomnia -Trazodone 100mg  oral daily at bedtime   GERD -Protonix 40mg  oral daily with breakfast   Nutritional supplements -Folvite 1mg  oral daily -Multivitamin with minerals 1 tablet oral daily -Thiamine 100mg  oral daily   Allergic rhinitis -Flonase 1 spray each nare, daily   PRNs -Tylenol 650mg  oral every 6 hours PRN mild pain -Ventolin HFA 1-2 puff, inhalation, every 6 hours PRN, wheezing, shortness of breath -Maalox/Mylanta 30mL oral every 4 hours PRN indigestion -Benadryl 50mg  oral or IM 3 times daily PRN agitation -Haldol 5mg  oral or IM 3 times daily PRN agitation -Ativan 2mg  oral or IM 3 times daily PRN agitation -Milk of Magnesia 30mL oral daily PRN -Robaxin 500mg  oral every 6 hours PRN muscle spasms -Zofran ODT 4mg  oral every 12 hours PRN nausea, vomiting -Preparation H 1 application rectal 2 times daily PRN hemorrhoids    Nanine Means, NP 07/18/2023, 4:22 PM

## 2023-07-18 NOTE — BH IP Treatment Plan (Signed)
Interdisciplinary Treatment and Diagnostic Plan Update  07/18/2023 Time of Session: 09:25 Denise Knight MRN: [id number]  Principal Diagnosis: Bipolar affective disorder, depressed, severe (HCC)  Secondary Diagnoses: Principal Problem:   Bipolar affective disorder, depressed, severe (HCC) Active Problems:   Polysubstance abuse (HCC)   PTSD (post-traumatic stress disorder)   Current Medications:  Current Facility-Administered Medications  Medication Dose Route Frequency Provider Last Rate Last Admin   acetaminophen (TYLENOL) tablet 650 mg  650 mg Oral Q6H PRN Weber, Kyra A, NP   650 mg at 07/17/23 0639   albuterol (VENTOLIN HFA) 108 (90 Base) MCG/ACT inhaler 1-2 puff  1-2 puff Inhalation Q6H PRN Weber, Kyra A, NP       alum & mag hydroxide-simeth (MAALOX/MYLANTA) 200-200-20 MG/5ML suspension 30 mL  30 mL Oral Q4H PRN Weber, Kyra A, NP       diphenhydrAMINE (BENADRYL) capsule 50 mg  50 mg Oral TID PRN Weber, Kyra A, NP       Or   diphenhydrAMINE (BENADRYL) injection 50 mg  50 mg Intramuscular TID PRN Weber, Kyra A, NP       fluticasone (FLONASE) 50 MCG/ACT nasal spray 1 spray  1 spray Each Nare Daily Weber, Kyra A, NP       folic acid (FOLVITE) tablet 1 mg  1 mg Oral Daily Weber, Kyra A, NP   1 mg at 07/18/23 8295   haloperidol (HALDOL) tablet 5 mg  5 mg Oral TID PRN Weber, Kyra A, NP       Or   haloperidol lactate (HALDOL) injection 5 mg  5 mg Intramuscular TID PRN Weber, Kyra A, NP       LORazepam (ATIVAN) tablet 2 mg  2 mg Oral TID PRN Weber, Kyra A, NP       Or   LORazepam (ATIVAN) injection 2 mg  2 mg Intramuscular TID PRN Weber, Kyra A, NP       LORazepam (ATIVAN) tablet 1-4 mg  1-4 mg Oral Q1H PRN Weber, Kyra A, NP   1 mg at 07/18/23 0821   lurasidone (LATUDA) tablet 20 mg  20 mg Oral Q breakfast Lauree Chandler, NP   20 mg at 07/18/23 6213   magnesium hydroxide (MILK OF MAGNESIA) suspension 30 mL  30 mL Oral Daily PRN Weber, Kyra A, NP       methocarbamol (ROBAXIN) tablet  500 mg  500 mg Oral Q6H PRN Lauree Chandler, NP   500 mg at 07/18/23 0865   multivitamin with minerals tablet 1 tablet  1 tablet Oral Daily Weber, Kyra A, NP   1 tablet at 07/18/23 0821   ondansetron (ZOFRAN-ODT) disintegrating tablet 4 mg  4 mg Oral Q12H PRN Lauree Chandler, NP       pantoprazole (PROTONIX) EC tablet 40 mg  40 mg Oral Q breakfast Lauree Chandler, NP   40 mg at 07/18/23 0601   phenylephrine-shark liver oil-mineral oil-petrolatum (PREPARATION H) rectal ointment 1 Application  1 Application Rectal BID PRN Lauree Chandler, NP       prazosin (MINIPRESS) capsule 1 mg  1 mg Oral QHS Lauree Chandler, NP   1 mg at 07/17/23 2113   thiamine (VITAMIN B1) tablet 100 mg  100 mg Oral Daily Weber, Kyra A, NP   100 mg at 07/18/23 0821   traZODone (DESYREL) tablet 100 mg  100 mg Oral QHS Weber, Kyra A, NP   100 mg at 07/17/23 2113   PTA Medications: Medications Prior to  Admission  Medication Sig Dispense Refill Last Dose   albuterol (VENTOLIN HFA) 108 (90 Base) MCG/ACT inhaler Inhale 1-2 puffs into the lungs every 6 (six) hours as needed for wheezing or shortness of breath. 1 each 0    fluticasone (FLONASE) 50 MCG/ACT nasal spray Place 1 spray into both nostrils daily.      melatonin 5 MG TABS Take 1 tablet (5 mg total) by mouth at bedtime as needed. 30 tablet 0    metroNIDAZOLE (FLAGYL) 500 MG tablet Take 500 mg by mouth 2 (two) times daily.      nystatin cream (MYCOSTATIN) Apply topically 2 (two) times daily. 30 g 0    pantoprazole (PROTONIX) 40 MG tablet Take 1 tablet (40 mg total) by mouth every morning. 30 tablet 0     Patient Stressors: Medication change or noncompliance   Substance abuse    Patient Strengths: Motivation for treatment/growth  Supportive family/friends   Treatment Modalities: Medication Management, Group therapy, Case management,  1 to 1 session with clinician, Psychoeducation, Recreational therapy.   Physician Treatment Plan for Primary  Diagnosis: Bipolar affective disorder, depressed, severe (HCC) Long Term Goal(s): Improvement in symptoms so as ready for discharge   Short Term Goals: Ability to identify changes in lifestyle to reduce recurrence of condition will improve Ability to verbalize feelings will improve Ability to disclose and discuss suicidal ideas Ability to demonstrate self-control will improve Ability to identify and develop effective coping behaviors will improve Ability to identify triggers associated with substance abuse/mental health issues will improve  Medication Management: Evaluate patient's response, side effects, and tolerance of medication regimen.  Therapeutic Interventions: 1 to 1 sessions, Unit Group sessions and Medication administration.  Evaluation of Outcomes: Not Met  Physician Treatment Plan for Secondary Diagnosis: Principal Problem:   Bipolar affective disorder, depressed, severe (HCC) Active Problems:   Polysubstance abuse (HCC)   PTSD (post-traumatic stress disorder)  Long Term Goal(s): Improvement in symptoms so as ready for discharge   Short Term Goals: Ability to identify changes in lifestyle to reduce recurrence of condition will improve Ability to verbalize feelings will improve Ability to disclose and discuss suicidal ideas Ability to demonstrate self-control will improve Ability to identify and develop effective coping behaviors will improve Ability to identify triggers associated with substance abuse/mental health issues will improve     Medication Management: Evaluate patient's response, side effects, and tolerance of medication regimen.  Therapeutic Interventions: 1 to 1 sessions, Unit Group sessions and Medication administration.  Evaluation of Outcomes: Not Met   RN Treatment Plan for Primary Diagnosis: Bipolar affective disorder, depressed, severe (HCC) Long Term Goal(s): Knowledge of disease and therapeutic regimen to maintain health will improve  Short  Term Goals: Ability to remain free from injury will improve, Ability to verbalize frustration and anger appropriately will improve, Ability to demonstrate self-control, Ability to participate in decision making will improve, Ability to verbalize feelings will improve, Ability to disclose and discuss suicidal ideas, Ability to identify and develop effective coping behaviors will improve, and Compliance with prescribed medications will improve  Medication Management: RN will administer medications as ordered by provider, will assess and evaluate patient's response and provide education to patient for prescribed medication. RN will report any adverse and/or side effects to prescribing provider.  Therapeutic Interventions: 1 on 1 counseling sessions, Psychoeducation, Medication administration, Evaluate responses to treatment, Monitor vital signs and CBGs as ordered, Perform/monitor CIWA, COWS, AIMS and Fall Risk screenings as ordered, Perform wound care treatments as ordered.  Evaluation  of Outcomes: Not Met   LCSW Treatment Plan for Primary Diagnosis: Bipolar affective disorder, depressed, severe (HCC) Long Term Goal(s): Safe transition to appropriate next level of care at discharge, Engage patient in therapeutic group addressing interpersonal concerns.  Short Term Goals: Engage patient in aftercare planning with referrals and resources, Increase social support, Increase ability to appropriately verbalize feelings, Increase emotional regulation, Facilitate acceptance of mental health diagnosis and concerns, Facilitate patient progression through stages of change regarding substance use diagnoses and concerns, Identify triggers associated with mental health/substance abuse issues, and Increase skills for wellness and recovery  Therapeutic Interventions: Assess for all discharge needs, 1 to 1 time with Social worker, Explore available resources and support systems, Assess for adequacy in community support  network, Educate family and significant other(s) on suicide prevention, Complete Psychosocial Assessment, Interpersonal group therapy.  Evaluation of Outcomes: Not Met   Progress in Treatment: Attending groups: No. Participating in groups: No. Taking medication as prescribed: Yes. Toleration medication: Yes. Family/Significant other contact made: No, will contact:  if given permission as pt has declined familial/collateral contact. Patient understands diagnosis: Yes. Discussing patient identified problems/goals with staff: Yes. Medical problems stabilized or resolved: Yes. Denies suicidal/homicidal ideation: Yes. Issues/concerns per patient self-inventory: No. Other: none.  New problem(s) identified: No, Describe:  none identified.  New Short Term/Long Term Goal(s): detox, elimination of symptoms of psychosis, medication management for mood stabilization; elimination of SI thoughts; development of comprehensive mental wellness/sobriety plan.   Patient Goals:  "Get into a program."  Discharge Plan or Barriers: CSW will assist pt with development of an appropriate aftercare/discharge plan.   Reason for Continuation of Hospitalization: Depression Medication stabilization Suicidal ideation Withdrawal symptoms  Estimated Length of Stay: 1-7 days  Last 3 [place] Suicide Severity Risk Score: Flowsheet Row Admission (Current) from 07/16/2023 in [facility] INPATIENT BEHAVIORAL MEDICINE Most recent reading at 07/17/2023 12:06 AM ED from 07/16/2023 in [facility] Emergency Department at [facility] Most recent reading at 07/16/2023 10:35 AM ED from 05/11/2023 in [facility] Emergency Department at [facility] Most recent reading at 05/11/2023  9:01 AM  C-SSRS RISK CATEGORY High Risk High Risk No Risk       Last PHQ 2/9 Scores:    01/15/2023    9:33 AM 01/14/2023   12:25 PM 01/12/2023    5:55 PM  Depression screen PHQ 2/9  Decreased Interest 0 1 1  Down, Depressed, Hopeless 0 0 1   PHQ - 2 Score 0 1 2  Altered sleeping 0 1 3  Tired, decreased energy 0 0 1  Change in appetite 0 0 0  Feeling bad or failure about yourself  0 1 1  Trouble concentrating 0 0 3  Moving slowly or fidgety/restless 0 0 0  Suicidal thoughts 0 0 1  PHQ-9 Score 0 3 11  Difficult doing work/chores Not difficult at all Not difficult at all Somewhat difficult    Scribe for Treatment Team: Glenis Smoker, LCSW 07/18/2023 4:55 PM

## 2023-07-18 NOTE — Plan of Care (Signed)
D- Patient alert and oriented. Patient presented in a pleasant mood on assessment stating that she slept good last night, after reportedly being up for "six days doing drugs". Patient had complaints of anxiety and knee pain, in which she did request PRN medication to help with relief. Patient also endorsed depression, but stated that "it's not as strong as when I came in". Patient stated that her anxiety is high due to "being around a bunch of people. I don't like being around a lot of people. Patient denied SI, HI, AVH at this time, stating "not today, thank-you Jesus". Patient's goal for treatment is to get into rehab, preferably in [place], [place].  A- Scheduled medications administered to patient, per MD orders. Support and encouragement provided. Routine safety checks conducted every 15 minutes. Patient informed to notify staff with problems or concerns.  R- No adverse drug reactions noted. Patient contracts for safety at this time. Patient compliant with medications. Patient receptive, calm, and cooperative. Patient isolates to room, except for meals and medication. Patient remains safe at this time.  Problem: Education: Goal: Knowledge of General Education information will improve Description: Including pain rating scale, medication(s)/side effects and non-pharmacologic comfort measures Outcome: Progressing   Problem: Health Behavior/Discharge Planning: Goal: Ability to manage health-related needs will improve Outcome: Progressing   Problem: Clinical Measurements: Goal: Ability to maintain clinical measurements within normal limits will improve Outcome: Progressing Goal: Will remain free from infection Outcome: Progressing Goal: Diagnostic test results will improve Outcome: Progressing Goal: Respiratory complications will improve Outcome: Progressing Goal: Cardiovascular complication will be avoided Outcome: Progressing   Problem: Activity: Goal: Risk for activity intolerance will  decrease Outcome: Progressing   Problem: Nutrition: Goal: Adequate nutrition will be maintained Outcome: Progressing   Problem: Coping: Goal: Level of anxiety will decrease Outcome: Progressing   Problem: Elimination: Goal: Will not experience complications related to bowel motility Outcome: Progressing Goal: Will not experience complications related to urinary retention Outcome: Progressing   Problem: Pain Managment: Goal: General experience of comfort will improve Outcome: Progressing   Problem: Safety: Goal: Ability to remain free from injury will improve Outcome: Progressing   Problem: Skin Integrity: Goal: Risk for impaired skin integrity will decrease Outcome: Progressing   Problem: Education: Goal: Knowledge of [place] General Education information/materials will improve Outcome: Progressing Goal: Emotional status will improve Outcome: Progressing Goal: Mental status will improve Outcome: Progressing Goal: Verbalization of understanding the information provided will improve Outcome: Progressing   Problem: Activity: Goal: Interest or engagement in activities will improve Outcome: Progressing Goal: Sleeping patterns will improve Outcome: Progressing   Problem: Coping: Goal: Ability to verbalize frustrations and anger appropriately will improve Outcome: Progressing Goal: Ability to demonstrate self-control will improve Outcome: Progressing   Problem: Health Behavior/Discharge Planning: Goal: Identification of resources available to assist in meeting health care needs will improve Outcome: Progressing Goal: Compliance with treatment plan for underlying cause of condition will improve Outcome: Progressing   Problem: Physical Regulation: Goal: Ability to maintain clinical measurements within normal limits will improve Outcome: Progressing   Problem: Safety: Goal: Periods of time without injury will increase Outcome: Progressing

## 2023-07-18 NOTE — Group Note (Signed)
Recreation Therapy Group Note   Group Topic:Leisure Education  Group Date: 07/18/2023 Start Time: 1000 End Time: 1100 Facilitators: Rosina Lowenstein, LRT, CTRS Location:  Craft Room  Group Description: Leisure. Patients were given the option to choose from singing karaoke, coloring mandalas, using oil pastels, journaling, or playing with play-doh. LRT and pts discussed the meaning of leisure, the importance of participating in leisure during their free time/when they're outside of the hospital, as well as how our leisure interests can also serve as coping skills.   Goal Area(s) Addressed:  Patient will identify a current leisure interest.  Patient will learn the definition of "leisure". Patient will practice making a positive decision. Patient will have the opportunity to try a new leisure activity. Patient will communicate with peers and LRT.    Affect/Mood: N/A   Participation Level: Did not attend    Clinical Observations/Individualized Feedback: Denise Knight did not attend group.  Plan: Continue to engage patient in RT group sessions 2-3x/week.   Rosina Lowenstein, LRT, CTRS 07/18/2023 11:40 AM

## 2023-07-18 NOTE — Group Note (Signed)
Date:  07/18/2023 Time:  9:20 PM  Group Topic/Focus:  Wrap-Up Group:   The focus of this group is to help patients review their daily goal of treatment and discuss progress on daily workbooks.    Participation Level:  Did Not Attend   Katina Dung 07/18/2023, 9:20 PM

## 2023-07-18 NOTE — Group Note (Signed)
Date:  07/18/2023 Time:  9:52 AM  Group Topic/Focus:  Goals Group:   The focus of this group is to help patients establish daily goals to achieve during treatment and discuss how the patient can incorporate goal setting into their daily lives to aide in recovery.    Participation Level:  Did Not Attend   Lynelle Smoke [facility] 07/18/2023, 9:52 AM

## 2023-07-19 DIAGNOSIS — F314 Bipolar disorder, current episode depressed, severe, without psychotic features: Secondary | ICD-10-CM | POA: Diagnosis not present

## 2023-07-19 LAB — TSH: TSH: 1.141 u[IU]/mL (ref 0.350–4.500)

## 2023-07-19 MED ORDER — AMLODIPINE BESYLATE 5 MG PO TABS
10.0000 mg | ORAL_TABLET | Freq: Every day | ORAL | Status: DC
  Administered 2023-07-19 – 2023-07-24 (×6): 10 mg via ORAL
  Filled 2023-07-19 (×6): qty 2

## 2023-07-19 NOTE — Plan of Care (Signed)
  Problem: Education: Goal: Knowledge of General Education information will improve Description: Including pain rating scale, medication(s)/side effects and non-pharmacologic comfort measures 07/19/2023 0705 by Trula Ore, RN Outcome: Progressing 07/19/2023 0704 by Trula Ore, RN Outcome: Progressing   Problem: Health Behavior/Discharge Planning: Goal: Ability to manage health-related needs will improve Outcome: Progressing

## 2023-07-19 NOTE — Progress Notes (Signed)
Patient alert and oriented x 4, affect is blunted but brightens upon approach, denies SI/HI/AVH interacting appropriately with peers and staff. 15 minutes safety checks maintained will continue to monitor closely.

## 2023-07-19 NOTE — Plan of Care (Signed)
  Problem: Health Behavior/Discharge Planning: Goal: Ability to manage health-related needs will improve Outcome: Progressing   Problem: Education: Goal: Knowledge of General Education information will improve Description: Including pain rating scale, medication(s)/side effects and non-pharmacologic comfort measures 07/19/2023 0705 by Trula Ore, RN Outcome: Progressing 07/19/2023 0705 by Trula Ore, RN Outcome: Progressing 07/19/2023 0704 by Trula Ore, RN Outcome: Progressing   Problem: Activity: Goal: Risk for activity intolerance will decrease Outcome: Progressing

## 2023-07-19 NOTE — Progress Notes (Signed)
[facility] MD Progress Note  07/19/2023 3:06 PM Denise Knight  MRN:  [id number]  Subjective:  57 yo female admitted with suicidal ideations and cocaine/alcohol abuse.  Notes, vital signs, and labs reviewed.  The client was sleeping soundly including snoring.  Aroused for assessment, she stated, "I'm ok" with specifics of mild depression and anxiety.  Denies suicidal ideations.  She did awaken a couple of times last night with nightmares related to her PTSD, Prazosin, in place.  Returned to sleep quickly.  Denies cravings and withdrawal symptoms.  Drifted off to sleep a couple of times during the conversation.    Principal Problem: Bipolar affective disorder, depressed, severe (HCC) Diagnosis: Principal Problem:   Bipolar affective disorder, depressed, severe (HCC) Active Problems:   Polysubstance abuse (HCC)   PTSD (post-traumatic stress disorder)  Total Time spent with patient: 30 minutes  Past Psychiatric History: bipolar d/o, PTSD, substance abuse  Past Medical History:  Past Medical History:  Diagnosis Date   Allergy    seasonal   Anxiety    Arthritis    "left knee" (07/05/2016)   Asthma    Bipolar 1 disorder (HCC)    Depression    Diabetes mellitus without complication (HCC)    pre-   GERD (gastroesophageal reflux disease)    Hypertension    MI (mitral incompetence)    Post traumatic stress disorder    Schizophrenia (HCC)    Stomach ulcer    Substance abuse (HCC)    cocaine quit july 2020   Suicide attempt [facility])     Past Surgical History:  Procedure Laterality Date   CARDIAC CATHETERIZATION  07/05/2016   CARDIAC CATHETERIZATION N/A 07/05/2016   Procedure: Left Heart Cath and Coronary Angiography;  Surgeon: Yates Decamp, MD;  Location: [facility] INVASIVE CV LAB;  Service: Cardiovascular;  Laterality: N/A;   CYST EXCISION Right    "wrist"   DILATION AND CURETTAGE OF UTERUS     FOOT SURGERY Bilateral    "corns removed"   LEFT HEART CATH AND CORONARY ANGIOGRAPHY N/A 12/24/2017    Procedure: LEFT HEART CATH AND CORONARY ANGIOGRAPHY;  Surgeon: Elder Negus, MD;  Location: [facility] INVASIVE CV LAB;  Service: Cardiovascular;  Laterality: N/A;   TUBAL LIGATION     Family History:  Family History  Problem Relation Age of Onset   Lung cancer Other    Congestive Heart Failure Other    Diabetes Other    Hypertension Other    Colon cancer Neg Hx    Colon polyps Neg Hx    Esophageal cancer Neg Hx    Stomach cancer Neg Hx    Rectal cancer Neg Hx    Family Psychiatric  History: none Social History:  Social History   Substance and Sexual Activity  Alcohol Use Yes   Alcohol/week: 7.0 standard drinks of alcohol   Types: 7 Cans of beer per week     Social History   Substance and Sexual Activity  Drug Use Yes   Types: Marijuana    Social History   Socioeconomic History   Marital status: Widowed    Spouse name: Not on file   Number of children: 2   Years of education: Not on file   Highest education level: Not on file  Occupational History   Occupation: disabled   Tobacco Use   Smoking status: Some Days    Current packs/day: 0.00    Average packs/day: 0.3 packs/day for 35.0 years (8.8 ttl pk-yrs)    Types: Cigarettes  Start date: 05/29/1984    Last attempt to quit: 05/30/2019    Years since quitting: 4.1   Smokeless tobacco: Never  Vaping Use   Vaping status: Never Used  Substance and Sexual Activity   Alcohol use: Yes    Alcohol/week: 7.0 standard drinks of alcohol    Types: 7 Cans of beer per week   Drug use: Yes    Types: Marijuana   Sexual activity: Yes    Birth control/protection: Surgical  Other Topics Concern   Not on file  Social History Narrative   Not on file   Social Determinants of Health   Financial Resource Strain: Not on File (02/28/2022)   Received from [employer] [facility] [place] Strain    Financial Resource Strain: 0  Recent Concern: Physicist, medical Strain - Medium Risk (02/11/2022)   Received from [facility]   Overall Financial Resource Strain (CARDIA)    Difficulty of Paying Living Expenses: Somewhat hard  Food Insecurity: No Food Insecurity (07/17/2023)   Hunger Vital Sign    Worried About Running Out of Food in the Last Year: Never true    Ran Out of Food in the Last Year: Never true  Transportation Needs: No Transportation Needs (07/17/2023)   PRAPARE - Administrator, Civil Service (Medical): No    Lack of Transportation (Non-Medical): No  Physical Activity: Not on File (02/28/2022)   Received from [place], [place]   Physical Activity    Physical Activity: 0  Stress: Not on File (02/28/2022)   Received from [facility], [place]   Stress    Stress: 0  Social Connections: Not on File (07/16/2023)   Received from [facility]   Social Connections    Connectedness: 0   Additional Social History:       Sleep: Good  Appetite:  Good  Current Medications: Current Facility-Administered Medications  Medication Dose Route Frequency Provider Last Rate Last Admin   acetaminophen (TYLENOL) tablet 650 mg  650 mg Oral Q6H PRN Weber, Kyra A, NP   650 mg at 07/17/23 0639   albuterol (VENTOLIN HFA) 108 (90 Base) MCG/ACT inhaler 1-2 puff  1-2 puff Inhalation Q6H PRN Weber, Kyra A, NP   2 puff at 07/19/23 0907   alum & mag hydroxide-simeth (MAALOX/MYLANTA) 200-200-20 MG/5ML suspension 30 mL  30 mL Oral Q4H PRN Weber, Kyra A, NP       amLODipine (NORVASC) tablet 10 mg  10 mg Oral Daily Charm Rings, NP   10 mg at 07/19/23 1303   diphenhydrAMINE (BENADRYL) capsule 50 mg  50 mg Oral TID PRN Weber, Bella Kennedy A, NP       Or   diphenhydrAMINE (BENADRYL) injection 50 mg  50 mg Intramuscular TID PRN Weber, Kyra A, NP       fluticasone (FLONASE) 50 MCG/ACT nasal spray 1 spray  1 spray Each Nare Daily Weber, Kyra A, NP   1 spray at 07/19/23 0914   folic acid (FOLVITE) tablet 1 mg  1 mg Oral Daily Weber, Kyra A, NP   1 mg at 07/19/23 0918   haloperidol (HALDOL) tablet 5 mg  5 mg Oral TID PRN Weber,  Kyra A, NP       Or   haloperidol lactate (HALDOL) injection 5 mg  5 mg Intramuscular TID PRN Weber, Kyra A, NP       LORazepam (ATIVAN) tablet 1-4 mg  1-4 mg Oral Q1H PRN Weber, Leavy Cella, NP  1 mg at 07/18/23 0347   lurasidone (LATUDA) tablet 20 mg  20 mg Oral Q breakfast Lauree Chandler, NP   20 mg at 07/19/23 0857   magnesium hydroxide (MILK OF MAGNESIA) suspension 30 mL  30 mL Oral Daily PRN Weber, Bella Kennedy A, NP       methocarbamol (ROBAXIN) tablet 500 mg  500 mg Oral Q6H PRN Lauree Chandler, NP   500 mg at 07/19/23 0920   multivitamin with minerals tablet 1 tablet  1 tablet Oral Daily Weber, Kyra A, NP   1 tablet at 07/19/23 0922   ondansetron (ZOFRAN-ODT) disintegrating tablet 4 mg  4 mg Oral Q12H PRN Lauree Chandler, NP       pantoprazole (PROTONIX) EC tablet 40 mg  40 mg Oral Q breakfast Lauree Chandler, NP   40 mg at 07/19/23 0631   phenylephrine-shark liver oil-mineral oil-petrolatum (PREPARATION H) rectal ointment 1 Application  1 Application Rectal BID PRN Lauree Chandler, NP       prazosin (MINIPRESS) capsule 1 mg  1 mg Oral QHS Lauree Chandler, NP   1 mg at 07/18/23 2141   thiamine (VITAMIN B1) tablet 100 mg  100 mg Oral Daily Weber, Kyra A, NP   100 mg at 07/19/23 0923   traZODone (DESYREL) tablet 100 mg  100 mg Oral QHS Weber, Kyra A, NP   100 mg at 07/18/23 2141    Lab Results:  No results found for this or any previous visit (from the past 48 hour(s)).   Blood Alcohol level:  Lab Results  Component Value Date   ETH <10 07/16/2023   ETH <10 01/12/2023    Metabolic Disorder Labs: Lab Results  Component Value Date   HGBA1C 5.8 (H) 07/17/2023   MPG 119.76 07/17/2023   MPG 116.89 09/03/2021   No results found for: "PROLACTIN" Lab Results  Component Value Date   CHOL 166 07/17/2023   TRIG 203 (H) 07/17/2023   HDL 33 (L) 07/17/2023   CHOLHDL 5.0 07/17/2023   VLDL 41 (H) 07/17/2023   LDLCALC 92 07/17/2023   LDLCALC 146 (H) 09/03/2021      Musculoskeletal: Strength & Muscle Tone: within normal limits Gait & Station: normal Patient leans: N/A  Psychiatric Specialty Exam: Physical Exam Vitals and nursing note reviewed.  Constitutional:      Appearance: Normal appearance.  HENT:     Head: Normocephalic.     Nose: Nose normal.  Pulmonary:     Effort: Pulmonary effort is normal.  Musculoskeletal:        General: Normal range of motion.     Cervical back: Normal range of motion.  Neurological:     General: No focal deficit present.     Mental Status: She is alert and oriented to person, place, and time.     Review of Systems  Psychiatric/Behavioral:  Positive for depression and substance abuse. The patient is nervous/anxious.   All other systems reviewed and are negative.   Blood pressure (!) 140/86, pulse 82, temperature 98.1 F (36.7 C), resp. rate 18, SpO2 97%.There is no height or weight on file to calculate BMI.  General Appearance: Disheveled  Eye Contact:  Fair  Speech:  Clear and Coherent  Volume:  Normal  Mood:  Anxious and Depressed  Affect:  Congruent  Thought Process:  Coherent  Orientation:  Full (Time, Place, and Person)  Thought Content:  Logical  Suicidal Thoughts:  No  Homicidal Thoughts:  No  Memory:  Immediate;   Fair Recent;   Fair Remote;   Fair  Judgement:  Fair  Insight:  Fair  Psychomotor Activity:  Decreased  Concentration:  Concentration: Fair and Attention Span: Fair  Recall:  [employer] of Knowledge:  Fair  Language:  Good  Akathisia:  No  Handed:  Right  AIMS (if indicated):     Assets:  Leisure Time Physical Health Resilience  ADL's:  Intact  Cognition:  WNL  Sleep:          Physical Exam: Physical Exam Vitals and nursing note reviewed.  Constitutional:      Appearance: Normal appearance.  HENT:     Head: Normocephalic.     Nose: Nose normal.  Pulmonary:     Effort: Pulmonary effort is normal.  Musculoskeletal:        General: Normal range of  motion.     Cervical back: Normal range of motion.  Neurological:     General: No focal deficit present.     Mental Status: She is alert and oriented to person, place, and time.    Review of Systems  Psychiatric/Behavioral:  Positive for depression and substance abuse. The patient is nervous/anxious.   All other systems reviewed and are negative.  Blood pressure (!) 140/86, pulse 82, temperature 98.1 F (36.7 C), resp. rate 18, SpO2 97%. There is no height or weight on file to calculate BMI.   Treatment Plan Summary: Daily contact with patient to assess and evaluate symptoms and progress in treatment, Medication management, and Plan     Bipolar 1 disorder -Latuda 20mg  oral daily with breakfast Lipid panel with HDL 33 L, and triglycerides 203 H and VLDL 41 H A1C 5.8  TSH ordered, EKG completed in the ED   PTSD -Minipress 1mg  oral daily at bedtime   Alcohol withdrawal -CIWA with PRN ativan   Insomnia -Trazodone 100mg  oral daily at bedtime   GERD -Protonix 40mg  oral daily with breakfast   Nutritional supplements -Folvite 1mg  oral daily -Multivitamin with minerals 1 tablet oral daily -Thiamine 100mg  oral daily   Allergic rhinitis -Flonase 1 spray each nare, daily   PRNs -Tylenol 650mg  oral every 6 hours PRN mild pain -Ventolin HFA 1-2 puff, inhalation, every 6 hours PRN, wheezing, shortness of breath -Maalox/Mylanta 30mL oral every 4 hours PRN indigestion -Benadryl 50mg  oral or IM 3 times daily PRN agitation -Haldol 5mg  oral or IM 3 times daily PRN agitation -Ativan 2mg  oral or IM 3 times daily PRN agitation -Milk of Magnesia 30mL oral daily PRN -Robaxin 500mg  oral every 6 hours PRN muscle spasms -Zofran ODT 4mg  oral every 12 hours PRN nausea, vomiting -Preparation H 1 application rectal 2 times daily PRN hemorrhoids    Nanine Means, NP 07/19/2023, 3:06 PM

## 2023-07-19 NOTE — Group Note (Signed)
LCSW Group Therapy Note  Group Date: 07/19/2023 Start Time: 1305 End Time: 1345   Type of Therapy and Topic:  Group Therapy - Healthy vs Unhealthy Coping Skills  Participation Level:  Did Not Attend   Description of Group The focus of this group was to determine what unhealthy coping techniques typically are used by group members and what healthy coping techniques would be helpful in coping with various problems. Patients were guided in becoming aware of the differences between healthy and unhealthy coping techniques. Patients were asked to identify 2-3 healthy coping skills they would like to learn to use more effectively.  Therapeutic Goals Patients learned that coping is what human beings do all day long to deal with various situations in their lives Patients defined and discussed healthy vs unhealthy coping techniques Patients identified their preferred coping techniques and identified whether these were healthy or unhealthy Patients determined 2-3 healthy coping skills they would like to become more familiar with and use more often. Patients provided support and ideas to each other   Summary of Patient Progress:  The patient did not attend.    Marshell Levan, LCSWA 07/19/2023  3:08 PM

## 2023-07-19 NOTE — Progress Notes (Signed)
   07/19/23 1200  Psych Admission Type (Psych Patients Only)  Admission Status Voluntary  Psychosocial Assessment  Patient Complaints Anxiety  Eye Contact Brief  Facial Expression Anxious  Affect Anxious  Speech Logical/coherent  Interaction Seclusive  Motor Activity Slow  Appearance/Hygiene In scrubs  Behavior Characteristics Anxious  Mood Depressed;Irritable  Aggressive Behavior  Effect No apparent injury  Thought Process  Coherency Circumstantial  Content WDL  Delusions None reported or observed  Perception WDL  Hallucination None reported or observed  Judgment WDL  Confusion None  Danger to Self  Current suicidal ideation? Denies  Danger to Others  Danger to Others None reported or observed  Danger to Others Abnormal  Harmful Behavior to others No threats or harm toward other people  Destructive Behavior No threats or harm toward property

## 2023-07-19 NOTE — Group Note (Signed)
Date:  07/19/2023 Time:  10:47 PM  Group Topic/Focus:  Healthy Communication:   The focus of this group is to discuss communication, barriers to communication, as well as healthy ways to communicate with others. Managing Feelings:   The focus of this group is to identify what feelings patients have difficulty handling and develop a plan to handle them in a healthier way upon discharge. Wrap-Up Group:   The focus of this group is to help patients review their daily goal of treatment and discuss progress on daily workbooks.    Participation Level:  Did Not Attend    Denise Knight 07/19/2023, 10:47 PM

## 2023-07-19 NOTE — Plan of Care (Signed)
  Problem: Activity: Goal: Risk for activity intolerance will decrease Outcome: Progressing   Problem: Clinical Measurements: Goal: Will remain free from infection Outcome: Progressing   Problem: Education: Goal: Knowledge of General Education information will improve Description: Including pain rating scale, medication(s)/side effects and non-pharmacologic comfort measures Outcome: Progressing

## 2023-07-19 NOTE — Plan of Care (Signed)
  Problem: Health Behavior/Discharge Planning: Goal: Ability to manage health-related needs will improve 07/19/2023 0706 by Trula Ore, RN Outcome: Progressing 07/19/2023 0705 by Trula Ore, RN Outcome: Progressing   Problem: Clinical Measurements: Goal: Ability to maintain clinical measurements within normal limits will improve 07/19/2023 0706 by Trula Ore, RN Outcome: Progressing 07/19/2023 0705 by Trula Ore, RN Outcome: Progressing 07/19/2023 0705 by Trula Ore, RN Outcome: Progressing

## 2023-07-20 DIAGNOSIS — F314 Bipolar disorder, current episode depressed, severe, without psychotic features: Secondary | ICD-10-CM | POA: Diagnosis not present

## 2023-07-20 NOTE — Progress Notes (Signed)
[facility] MD Progress Note  07/20/2023 10:07 AM Denise Knight  MRN:  [id number]  Subjective:  57 yo female admitted with suicidal ideations and cocaine/alcohol abuse.  Notes, vital signs, and labs reviewed. The client was sleeping soundly. Aroused for assessment, she stated, "I'm ok" with specifics of mild depression and anxiety. Denies suicidal ideations. She did awaken a couple of times last night. Denies cravings and withdrawal symptoms. Reports "good appetite, I just don't get enough," Encouraged to eat during meal time and seek snacks between. Still interested in rehab in [place], [place].  Principal Problem: Bipolar affective disorder, depressed, severe (HCC) Diagnosis: Principal Problem:   Bipolar affective disorder, depressed, severe (HCC) Active Problems:   Polysubstance abuse (HCC)   PTSD (post-traumatic stress disorder)  Total Time spent with patient: 30 minutes  Past Psychiatric History: bipolar d/o, PTSD, substance abuse  Past Medical History:  Past Medical History:  Diagnosis Date   Allergy    seasonal   Anxiety    Arthritis    "left knee" (07/05/2016)   Asthma    Bipolar 1 disorder (HCC)    Depression    Diabetes mellitus without complication (HCC)    pre-   GERD (gastroesophageal reflux disease)    Hypertension    MI (mitral incompetence)    Post traumatic stress disorder    Schizophrenia (HCC)    Stomach ulcer    Substance abuse (HCC)    cocaine quit july 2020   Suicide attempt [facility])     Past Surgical History:  Procedure Laterality Date   CARDIAC CATHETERIZATION  07/05/2016   CARDIAC CATHETERIZATION N/A 07/05/2016   Procedure: Left Heart Cath and Coronary Angiography;  Surgeon: Yates Decamp, MD;  Location: [facility] INVASIVE CV LAB;  Service: Cardiovascular;  Laterality: N/A;   CYST EXCISION Right    "wrist"   DILATION AND CURETTAGE OF UTERUS     FOOT SURGERY Bilateral    "corns removed"   LEFT HEART CATH AND CORONARY ANGIOGRAPHY N/A 12/24/2017   Procedure: LEFT HEART  CATH AND CORONARY ANGIOGRAPHY;  Surgeon: Elder Negus, MD;  Location: [facility] INVASIVE CV LAB;  Service: Cardiovascular;  Laterality: N/A;   TUBAL LIGATION     Family History:  Family History  Problem Relation Age of Onset   Lung cancer Other    Congestive Heart Failure Other    Diabetes Other    Hypertension Other    Colon cancer Neg Hx    Colon polyps Neg Hx    Esophageal cancer Neg Hx    Stomach cancer Neg Hx    Rectal cancer Neg Hx    Family Psychiatric  History: none Social History:  Social History   Substance and Sexual Activity  Alcohol Use Yes   Alcohol/week: 7.0 standard drinks of alcohol   Types: 7 Cans of beer per week     Social History   Substance and Sexual Activity  Drug Use Yes   Types: Marijuana    Social History   Socioeconomic History   Marital status: Widowed    Spouse name: Not on file   Number of children: 2   Years of education: Not on file   Highest education level: Not on file  Occupational History   Occupation: disabled   Tobacco Use   Smoking status: Some Days    Current packs/day: 0.00    Average packs/day: 0.3 packs/day for 35.0 years (8.8 ttl pk-yrs)    Types: Cigarettes    Start date: 05/29/1984    Last attempt  to quit: 05/30/2019    Years since quitting: 4.1   Smokeless tobacco: Never  Vaping Use   Vaping status: Never Used  Substance and Sexual Activity   Alcohol use: Yes    Alcohol/week: 7.0 standard drinks of alcohol    Types: 7 Cans of beer per week   Drug use: Yes    Types: Marijuana   Sexual activity: Yes    Birth control/protection: Surgical  Other Topics Concern   Not on file  Social History Narrative   Not on file   Social Determinants of Health   Financial Resource Strain: Not on File (02/28/2022)   Received from [employer] [facility] [place] [employer] Strain    Financial Resource Strain: 0  Recent Concern: Physicist, medical Strain - Medium Risk (02/11/2022)   Received from [facility], [facility]    Overall Financial Resource Strain (CARDIA)    Difficulty of Paying Living Expenses: Somewhat hard  Food Insecurity: No Food Insecurity (07/17/2023)   Hunger Vital Sign    Worried About Running Out of Food in the Last Year: Never true    Ran Out of Food in the Last Year: Never true  Transportation Needs: No Transportation Needs (07/17/2023)   PRAPARE - Administrator, Civil Service (Medical): No    Lack of Transportation (Non-Medical): No  Physical Activity: Not on File (02/28/2022)   Received from [place], [place]   Physical Activity    Physical Activity: 0  Stress: Not on File (02/28/2022)   Received from [facility], [place]   Stress    Stress: 0  Social Connections: Not on File (07/16/2023)   Received [facility]   Social Connections    Connectedness: 0   Additional Social History:       Sleep: Good  Appetite:  Good  Current Medications: Current Facility-Administered Medications  Medication Dose Route Frequency Provider Last Rate Last Admin   acetaminophen (TYLENOL) tablet 650 mg  650 mg Oral Q6H PRN Weber, Kyra A, NP   650 mg at 07/17/23 0639   albuterol (VENTOLIN HFA) 108 (90 Base) MCG/ACT inhaler 1-2 puff  1-2 puff Inhalation Q6H PRN Weber, Kyra A, NP   2 puff at 07/19/23 0907   alum & mag hydroxide-simeth (MAALOX/MYLANTA) 200-200-20 MG/5ML suspension 30 mL  30 mL Oral Q4H PRN Weber, Kyra A, NP       amLODipine (NORVASC) tablet 10 mg  10 mg Oral Daily Charm Rings, NP   10 mg at 07/20/23 0756   diphenhydrAMINE (BENADRYL) capsule 50 mg  50 mg Oral TID PRN Weber, Bella Kennedy A, NP       Or   diphenhydrAMINE (BENADRYL) injection 50 mg  50 mg Intramuscular TID PRN Weber, Kyra A, NP       fluticasone (FLONASE) 50 MCG/ACT nasal spray 1 spray  1 spray Each Nare Daily Weber, Kyra A, NP   1 spray at 07/19/23 0914   folic acid (FOLVITE) tablet 1 mg  1 mg Oral Daily Weber, Kyra A, NP   1 mg at 07/19/23 0918   haloperidol (HALDOL) tablet 5 mg  5 mg Oral TID PRN Weber, Kyra A, NP       Or    haloperidol lactate (HALDOL) injection 5 mg  5 mg Intramuscular TID PRN Weber, Kyra A, NP       lurasidone (LATUDA) tablet 20 mg  20 mg Oral Q breakfast Lauree Chandler, NP   20 mg at 07/20/23 0756   magnesium  hydroxide (MILK OF MAGNESIA) suspension 30 mL  30 mL Oral Daily PRN Weber, Kyra A, NP       methocarbamol (ROBAXIN) tablet 500 mg  500 mg Oral Q6H PRN Lauree Chandler, NP   500 mg at 07/20/23 0756   multivitamin with minerals tablet 1 tablet  1 tablet Oral Daily Weber, Kyra A, NP   1 tablet at 07/19/23 0922   ondansetron (ZOFRAN-ODT) disintegrating tablet 4 mg  4 mg Oral Q12H PRN Lauree Chandler, NP       pantoprazole (PROTONIX) EC tablet 40 mg  40 mg Oral Q breakfast Lauree Chandler, NP   40 mg at 07/20/23 0981   phenylephrine-shark liver oil-mineral oil-petrolatum (PREPARATION H) rectal ointment 1 Application  1 Application Rectal BID PRN Lauree Chandler, NP       prazosin (MINIPRESS) capsule 1 mg  1 mg Oral QHS Lauree Chandler, NP   1 mg at 07/19/23 2141   thiamine (VITAMIN B1) tablet 100 mg  100 mg Oral Daily Weber, Kyra A, NP   100 mg at 07/19/23 1914   traZODone (DESYREL) tablet 100 mg  100 mg Oral QHS Weber, Kyra A, NP   100 mg at 07/19/23 2141    Lab Results:  Results for orders placed or performed during the hospital encounter of 07/16/23 (from the past 48 hour(s))  TSH     Status: None   Collection Time: 07/19/23  3:04 PM  Result Value Ref Range   TSH 1.141 0.350 - 4.500 uIU/mL    Comment: Performed by a 3rd Generation assay with a functional sensitivity of <=0.01 uIU/mL. Performed at [facility], [address] [place], [place] [address]      Blood Alcohol level:  Lab Results  Component Value Date   [facility] <10 07/16/2023   ETH <10 01/12/2023    Metabolic Disorder Labs: Lab Results  Component Value Date   HGBA1C 5.8 (H) 07/17/2023   MPG 119.76 07/17/2023   MPG 116.89 09/03/2021   No results found for: "PROLACTIN" Lab Results   Component Value Date   CHOL 166 07/17/2023   TRIG 203 (H) 07/17/2023   HDL 33 (L) 07/17/2023   CHOLHDL 5.0 07/17/2023   VLDL 41 (H) 07/17/2023   LDLCALC 92 07/17/2023   LDLCALC 146 (H) 09/03/2021     Musculoskeletal: Strength & Muscle Tone: within normal limits Gait & Station: normal Patient leans: N/A  Psychiatric Specialty Exam: Physical Exam Vitals and nursing note reviewed.  Constitutional:      Appearance: Normal appearance.  HENT:     Head: Normocephalic.     Nose: Nose normal.  Pulmonary:     Effort: Pulmonary effort is normal.  Musculoskeletal:        General: Normal range of motion.     Cervical back: Normal range of motion.  Neurological:     General: No focal deficit present.     Mental Status: She is alert and oriented to person, place, and time.     Review of Systems  Psychiatric/Behavioral:  Positive for depression and substance abuse. The patient is nervous/anxious.   All other systems reviewed and are negative.   Blood pressure (!) 143/96, pulse 85, temperature 98.1 F (36.7 C), resp. rate 18, SpO2 100%.There is no height or weight on file to calculate BMI.  General Appearance: Disheveled  Eye Contact:  Fair  Speech:  Clear and Coherent  Volume:  Normal  Mood:  Fair  Affect:  Congruent  Thought Process:  Coherent  Orientation:  Full (Time, Place, and Person)  Thought Content:  Logical  Suicidal Thoughts:  No  Homicidal Thoughts:  No  Memory:  Immediate;   Fair Recent;   Fair Remote;   Fair  Judgement:  Fair  Insight:  Fair  Psychomotor Activity:  Decreased  Concentration:  Concentration: Fair and Attention Span: Fair  Recall:  [employer] of Knowledge:  Fair  Language:  Good  Akathisia:  No  Handed:  Right  AIMS (if indicated):     Assets:  Leisure Time Physical Health Resilience  ADL's:  Intact  Cognition:  WNL  Sleep: broken       Physical Exam: Physical Exam Vitals and nursing note reviewed.  Constitutional:       Appearance: Normal appearance.  HENT:     Head: Normocephalic.     Nose: Nose normal.  Pulmonary:     Effort: Pulmonary effort is normal.  Musculoskeletal:        General: Normal range of motion.     Cervical back: Normal range of motion.  Neurological:     General: No focal deficit present.     Mental Status: She is alert and oriented to person, place, and time.    Review of Systems  Psychiatric/Behavioral:  Positive for depression and substance abuse. The patient is nervous/anxious.   All other systems reviewed and are negative.  Blood pressure (!) 143/96, pulse 85, temperature 98.1 F (36.7 C), resp. rate 18, SpO2 100%. There is no height or weight on file to calculate BMI.   Treatment Plan Summary: Daily contact with patient to assess and evaluate symptoms and progress in treatment, Medication management, and Plan. Encouraged to get out of bed and participate in group.    Bipolar 1 disorder -Latuda 20mg  oral daily with breakfast Lipid panel with HDL 33 L, and triglycerides 203 H and VLDL 41 H A1C 5.8  TSH ordered, EKG completed in the ED   PTSD -Minipress 1mg  oral daily at bedtime   Alcohol withdrawal -CIWA with PRN ativan   Insomnia -Trazodone 100mg  oral daily at bedtime   GERD -Protonix 40mg  oral daily with breakfast   Nutritional supplements -Folvite 1mg  oral daily -Multivitamin with minerals 1 tablet oral daily -Thiamine 100mg  oral daily   Allergic rhinitis -Flonase 1 spray each nare, daily   PRNs -Tylenol 650mg  oral every 6 hours PRN mild pain -Ventolin HFA 1-2 puff, inhalation, every 6 hours PRN, wheezing, shortness of breath -Maalox/Mylanta 30mL oral every 4 hours PRN indigestion -Benadryl 50mg  oral or IM 3 times daily PRN agitation -Haldol 5mg  oral or IM 3 times daily PRN agitation -Ativan 2mg  oral or IM 3 times daily PRN agitation -Milk of Magnesia 30mL oral daily PRN -Robaxin 500mg  oral every 6 hours PRN muscle spasms -Zofran ODT 4mg  oral  every 12 hours PRN nausea, vomiting -Preparation H 1 application rectal 2 times daily PRN hemorrhoids    Nanine Means, NP 07/20/2023, 10:07 AM

## 2023-07-20 NOTE — Plan of Care (Signed)
  Problem: Education: Goal: Emotional status will improve Outcome: Progressing  Patient brightens upon approach, receptive to staff and interacting appropriately with staff.

## 2023-07-20 NOTE — Progress Notes (Signed)
Patient alert and oriented x 4, affect is blunted, he appears less irritable, pleasant and receptive to staff, he was noted interacting appropriately with peers. Patient denies SI/HI/AVH,compliant with medication regimen and he attended evening wrap up group. 15 minutes safety checks maintained will continue to monitor closely.

## 2023-07-20 NOTE — Group Note (Signed)
Date:  07/20/2023 Time:  12:11 PM  Group Topic/Focus:   coping with loneliness Loneliness, grief, and loss are very much related to both anxiety and depression.Group therapy activities can be very beneficial for individuals struggling with loneliness. They can provide a sense of connection, belonging, and shared interests      Participation Level:  Did Not Attend   Denise Knight 07/20/2023, 12:11 PM

## 2023-07-20 NOTE — Group Note (Signed)
Date:  07/20/2023 Time:  7:01 PM  Group Topic/Focus:  Rediscovering Joy:   The focus of this group is to explore various ways to relieve stress in a positive manner.    Participation Level:  Minimal  Participation Quality:  Appropriate and Attentive  Affect:  Appropriate  Cognitive:  Alert  Insight: Appropriate  Engagement in Group:  Developing/Improving  Modes of Intervention:  Activity  Additional Comments:    Keyera Hattabaugh 07/20/2023, 7:01 PM

## 2023-07-20 NOTE — Plan of Care (Signed)
Pt reports increasing anxiety. She reports that she will speak with doctor during rounding to discuss this. Denies SI / HI / AVH. No signs of distress or injury. Pt is selective with medication this morning. Reports 10/10 ankle pain. No signs of distress or injury. Staff will continue to monitor.    Problem: Education: Goal: Knowledge of General Education information will improve Description: Including pain rating scale, medication(s)/side effects and non-pharmacologic comfort measures Outcome: Progressing   Problem: Health Behavior/Discharge Planning: Goal: Ability to manage health-related needs will improve Outcome: Progressing   Problem: Clinical Measurements: Goal: Ability to maintain clinical measurements within normal limits will improve Outcome: Progressing

## 2023-07-21 DIAGNOSIS — F314 Bipolar disorder, current episode depressed, severe, without psychotic features: Secondary | ICD-10-CM | POA: Diagnosis not present

## 2023-07-21 MED ORDER — NALTREXONE HCL 50 MG PO TABS
50.0000 mg | ORAL_TABLET | Freq: Every day | ORAL | Status: DC
  Administered 2023-07-22 – 2023-07-24 (×3): 50 mg via ORAL
  Filled 2023-07-21 (×3): qty 1

## 2023-07-21 NOTE — [facility] Counselor (Addendum)
ADDENDUM CSW has sent fax to [facility] and provided patient with the contact information for the [facility] program.  Penni Homans, MSW, LCSW 07/21/2023 2:01 PM     CSW has faxed referrals for [facility] and [facility].   CSW received conformation that faxes were successful.  CSW attempted to meet with patient to complete application for [facility] program and provide the contact information for [employer]. Patient was asleep and CSW did not awaken patient. Patient was observed to be snoring.  CSW to attempt again this afternoon.  CSW to follow up referrals at later date.  Penni Homans, MSW, LCSW 07/21/2023 11:56 AM

## 2023-07-21 NOTE — Progress Notes (Cosign Needed Addendum)
[facility] MD Progress Note  07/21/2023 8:34 PM Denise Knight  MRN:  [id number]  Subjective:  57 yo female admitted with suicidal ideations and cocaine/alcohol abuse.  Notes, vital signs, and labs reviewed. The client was in the dayroom and not in her bed which was positive.  Her PRN Robaxin was discontinued yesterday as she was sleeping all the time, PRN acetaminophen in place, allergic to aspirin. No pain on assessment, she did ask for pain medication later this afternoon and let the RN know to give the current PRNs.  Denise Knight reported her depression as mild to moderate with no suicidal ideations.  Mild anxiety.  Appetite is good, sleep was not too good with awakenings, Trazodone increased.  She does report cravings today, discussed naltrexone which she does want to start. Accepted to [facility] and anxious about going, encouragement provided by social work.  Principal Problem: Bipolar affective disorder, depressed, severe (HCC) Diagnosis: Principal Problem:   Bipolar affective disorder, depressed, severe (HCC) Active Problems:   Polysubstance abuse (HCC)   PTSD (post-traumatic stress disorder)  Total Time spent with patient: 30 minutes  Past Psychiatric History: bipolar d/o, PTSD, substance abuse  Past Medical History:  Past Medical History:  Diagnosis Date   Allergy    seasonal   Anxiety    Arthritis    "left knee" (07/05/2016)   Asthma    Bipolar 1 disorder (HCC)    Depression    Diabetes mellitus without complication (HCC)    pre-   GERD (gastroesophageal reflux disease)    Hypertension    MI (mitral incompetence)    Post traumatic stress disorder    Schizophrenia (HCC)    Stomach ulcer    Substance abuse (HCC)    cocaine quit july 2020   Suicide attempt [facility])     Past Surgical History:  Procedure Laterality Date   CARDIAC CATHETERIZATION  07/05/2016   CARDIAC CATHETERIZATION N/A 07/05/2016   Procedure: Left Heart Cath and Coronary Angiography;  Surgeon: Yates Decamp, MD;   Location: [facility] INVASIVE CV LAB;  Service: Cardiovascular;  Laterality: N/A;   CYST EXCISION Right    "wrist"   DILATION AND CURETTAGE OF UTERUS     FOOT SURGERY Bilateral    "corns removed"   LEFT HEART CATH AND CORONARY ANGIOGRAPHY N/A 12/24/2017   Procedure: LEFT HEART CATH AND CORONARY ANGIOGRAPHY;  Surgeon: Elder Negus, MD;  Location: [facility] INVASIVE CV LAB;  Service: Cardiovascular;  Laterality: N/A;   TUBAL LIGATION     Family History:  Family History  Problem Relation Age of Onset   Lung cancer Other    Congestive Heart Failure Other    Diabetes Other    Hypertension Other    Colon cancer Neg Hx    Colon polyps Neg Hx    Esophageal cancer Neg Hx    Stomach cancer Neg Hx    Rectal cancer Neg Hx    Family Psychiatric  History: none Social History:  Social History   Substance and Sexual Activity  Alcohol Use Yes   Alcohol/week: 7.0 standard drinks of alcohol   Types: 7 Cans of beer per week     Social History   Substance and Sexual Activity  Drug Use Yes   Types: Marijuana    Social History   Socioeconomic History   Marital status: Widowed    Spouse name: Not on file   Number of children: 2   Years of education: Not on file   Highest education level: Not on  file  Occupational History   Occupation: disabled   Tobacco Use   Smoking status: Some Days    Current packs/day: 0.00    Average packs/day: 0.3 packs/day for 35.0 years (8.8 ttl pk-yrs)    Types: Cigarettes    Start date: 05/29/1984    Last attempt to quit: 05/30/2019    Years since quitting: 4.1   Smokeless tobacco: Never  Vaping Use   Vaping status: Never Used  Substance and Sexual Activity   Alcohol use: Yes    Alcohol/week: 7.0 standard drinks of alcohol    Types: 7 Cans of beer per week   Drug use: Yes    Types: Marijuana   Sexual activity: Yes    Birth control/protection: Surgical  Other Topics Concern   Not on file  Social History Narrative   Not on file   Social Determinants of  Health   Financial Resource Strain: Not on File (02/28/2022)   Received from [employer], [place] Strain    Financial Resource Strain: 0  Recent Concern: Physicist, medical Strain - Medium Risk (02/11/2022)   [facility], [facility]   Overall Financial Resource Strain (CARDIA)    Difficulty of Paying Living Expenses: Somewhat hard  Food Insecurity: No Food Insecurity (07/17/2023)   Hunger Vital Sign    Worried About Running Out of Food in the Last Year: Never true    Ran Out of Food in the Last Year: Never true  Transportation Needs: No Transportation Needs (07/17/2023)   PRAPARE - Administrator, Civil Service (Medical): No    Lack of Transportation (Non-Medical): No  Physical Activity: Not on File (02/28/2022)   Received from [place], [place]   Physical Activity    Physical Activity: 0  Stress: Not on File (02/28/2022)   Received from [facility], [place]   Stress    Stress: 0  Social Connections: Not on File (07/16/2023)   Received from [facility]   Social Connections    Connectedness: 0   Additional Social History:       Sleep: fair to poor last night  Appetite:  Good  Current Medications: Current Facility-Administered Medications  Medication Dose Route Frequency Provider Last Rate Last Admin   acetaminophen (TYLENOL) tablet 650 mg  650 mg Oral Q6H PRN Weber, Kyra A, NP   650 mg at 07/17/23 0639   albuterol (VENTOLIN HFA) 108 (90 Base) MCG/ACT inhaler 1-2 puff  1-2 puff Inhalation Q6H PRN Weber, Kyra A, NP   2 puff at 07/19/23 0907   alum & mag hydroxide-simeth (MAALOX/MYLANTA) 200-200-20 MG/5ML suspension 30 mL  30 mL Oral Q4H PRN Weber, Kyra A, NP       amLODipine (NORVASC) tablet 10 mg  10 mg Oral Daily Charm Rings, NP   10 mg at 07/21/23 1155   diphenhydrAMINE (BENADRYL) capsule 50 mg  50 mg Oral TID PRN Phebe Colla A, NP   50 mg at 07/20/23 2109   Or   diphenhydrAMINE (BENADRYL) injection 50 mg  50 mg Intramuscular TID PRN Weber, Kyra A,  NP       fluticasone (FLONASE) 50 MCG/ACT nasal spray 1 spray  1 spray Each Nare Daily Weber, Kyra A, NP   1 spray at 07/19/23 0914   folic acid (FOLVITE) tablet 1 mg  1 mg Oral Daily Weber, Kyra A, NP   1 mg at 07/21/23 1156   haloperidol (HALDOL) tablet 5 mg  5 mg Oral TID PRN  Weber, Bella Kennedy A, NP   5 mg at 07/20/23 2109   Or   haloperidol lactate (HALDOL) injection 5 mg  5 mg Intramuscular TID PRN Weber, Kyra A, NP       lurasidone (LATUDA) tablet 20 mg  20 mg Oral Q breakfast Lauree Chandler, NP   20 mg at 07/21/23 1156   magnesium hydroxide (MILK OF MAGNESIA) suspension 30 mL  30 mL Oral Daily PRN Weber, Bella Kennedy A, NP       multivitamin with minerals tablet 1 tablet  1 tablet Oral Daily Weber, Kyra A, NP   1 tablet at 07/21/23 1155   ondansetron (ZOFRAN-ODT) disintegrating tablet 4 mg  4 mg Oral Q12H PRN Lauree Chandler, NP       pantoprazole (PROTONIX) EC tablet 40 mg  40 mg Oral Q breakfast Lauree Chandler, NP   40 mg at 07/21/23 0740   phenylephrine-shark liver oil-mineral oil-petrolatum (PREPARATION H) rectal ointment 1 Application  1 Application Rectal BID PRN Lauree Chandler, NP       prazosin (MINIPRESS) capsule 1 mg  1 mg Oral QHS Lauree Chandler, NP   1 mg at 07/20/23 2112   thiamine (VITAMIN B1) tablet 100 mg  100 mg Oral Daily Weber, Kyra A, NP   100 mg at 07/21/23 1156   traZODone (DESYREL) tablet 100 mg  100 mg Oral QHS Weber, Kyra A, NP   100 mg at 07/20/23 2113    Lab Results:  No results found for this or any previous visit (from the past 48 hour(s)).    Blood Alcohol level:  Lab Results  Component Value Date   ETH <10 07/16/2023   ETH <10 01/12/2023    Metabolic Disorder Labs: Lab Results  Component Value Date   HGBA1C 5.8 (H) 07/17/2023   MPG 119.76 07/17/2023   MPG 116.89 09/03/2021   No results found for: "PROLACTIN" Lab Results  Component Value Date   CHOL 166 07/17/2023   TRIG 203 (H) 07/17/2023   HDL 33 (L) 07/17/2023   CHOLHDL 5.0  07/17/2023   VLDL 41 (H) 07/17/2023   LDLCALC 92 07/17/2023   LDLCALC 146 (H) 09/03/2021     Musculoskeletal: Strength & Muscle Tone: within normal limits Gait & Station: normal Patient leans: N/A  Psychiatric Specialty Exam: Physical Exam Vitals and nursing note reviewed.  Constitutional:      Appearance: Normal appearance.  HENT:     Head: Normocephalic.     Nose: Nose normal.  Pulmonary:     Effort: Pulmonary effort is normal.  Musculoskeletal:        General: Normal range of motion.     Cervical back: Normal range of motion.  Neurological:     General: No focal deficit present.     Mental Status: She is alert and oriented to person, place, and time.     Review of Systems  Psychiatric/Behavioral:  Positive for depression and substance abuse. The patient is nervous/anxious.   All other systems reviewed and are negative.   Blood pressure 124/88, pulse 83, temperature (!) 97.3 F (36.3 C), resp. rate 18, SpO2 97%.There is no height or weight on file to calculate BMI.  General Appearance: Casual  Eye Contact:  Fair  Speech:  Clear and Coherent  Volume:  Normal  Mood:  depression and anxiety  Affect:  Congruent  Thought Process:  Coherent  Orientation:  Full (Time, Place, and Person)  Thought Content:  Logical  Suicidal Thoughts:  No  Homicidal Thoughts:  No  Memory:  Immediate;   Fair Recent;   Fair Remote;   Fair  Judgement:  Fair  Insight:  Fair  Psychomotor Activity:  WDL  Concentration:  Good  Recall:  Good  Fund of Knowledge:  Good  Language:  Good  Akathisia:  No  Handed:  Right  AIMS (if indicated):     Assets:  Leisure Time Physical Health Resilience  ADL's:  Intact  Cognition:  WNL  Sleep: broken       Physical Exam: Physical Exam Vitals and nursing note reviewed.  Constitutional:      Appearance: Normal appearance.  HENT:     Head: Normocephalic.     Nose: Nose normal.  Pulmonary:     Effort: Pulmonary effort is normal.   Musculoskeletal:        General: Normal range of motion.     Cervical back: Normal range of motion.  Neurological:     General: No focal deficit present.     Mental Status: She is alert and oriented to person, place, and time.    Review of Systems  Psychiatric/Behavioral:  Positive for depression and substance abuse. The patient is nervous/anxious.   All other systems reviewed and are negative.  Blood pressure 124/88, pulse 83, temperature (!) 97.3 F (36.3 C), resp. rate 18, SpO2 97%. There is no height or weight on file to calculate BMI.   Treatment Plan Summary: Daily contact with patient to assess and evaluate symptoms and progress in treatment, Medication management, and Plan. Encouraged to get out of bed and participate in group.    Bipolar 1 disorder -Latuda 20mg  oral daily with breakfast Lipid panel with HDL 33 L, and triglycerides 203 H and VLDL 41 H A1C 5.8  TSH ordered, EKG completed in the ED   PTSD -Minipress 1mg  oral daily at bedtime   Alcohol withdrawal -CIWA with PRN ativan Naltrexone 50 mg daily started Accepted to [facility]   Insomnia -Trazodone 100mg  oral daily at bedtime   GERD -Protonix 40mg  oral daily with breakfast   Nutritional supplements -Folvite 1mg  oral daily -Multivitamin with minerals 1 tablet oral daily -Thiamine 100mg  oral daily   Allergic rhinitis -Flonase 1 spray each nare, daily   PRNs -Tylenol 650mg  oral every 6 hours PRN mild pain -Ventolin HFA 1-2 puff, inhalation, every 6 hours PRN, wheezing, shortness of breath -Maalox/Mylanta 30mL oral every 4 hours PRN indigestion -Benadryl 50mg  oral or IM 3 times daily PRN agitation -Haldol 5mg  oral or IM 3 times daily PRN agitation -Ativan 2mg  oral or IM 3 times daily PRN agitation -Milk of Magnesia 30mL oral daily PRN -Zofran ODT 4mg  oral every 12 hours PRN nausea, vomiting -Preparation H 1 application rectal 2 times daily PRN hemorrhoids    Nanine Means, NP 07/21/2023,  8:34 PM

## 2023-07-21 NOTE — Group Note (Signed)
[facility] LCSW Group Therapy Note    Group Date: 07/21/2023 Start Time: 1415 End Time: 1500  Type of Therapy and Topic:  Group Therapy:  Overcoming Obstacles  Participation Level:  [facility] PARTICIPATION LEVEL: Did Not Attend  Mood:  Description of Group:   In this group patients will be encouraged to explore what they see as obstacles to their own wellness and recovery. They will be guided to discuss their thoughts, feelings, and behaviors related to these obstacles. The group will process together ways to cope with barriers, with attention given to specific choices patients can make. Each patient will be challenged to identify changes they are motivated to make in order to overcome their obstacles. This group will be process-oriented, with patients participating in exploration of their own experiences as well as giving and receiving support and challenge from other group members.  Therapeutic Goals: 1. Patient will identify personal and current obstacles as they relate to admission. 2. Patient will identify barriers that currently interfere with their wellness or overcoming obstacles.  3. Patient will identify feelings, thought process and behaviors related to these barriers. 4. Patient will identify two changes they are willing to make to overcome these obstacles:    Summary of Patient Progress   X   Therapeutic Modalities:   Cognitive Behavioral Therapy Solution Focused Therapy Motivational Interviewing Relapse Prevention Therapy   Harden Mo, LCSW

## 2023-07-21 NOTE — Group Note (Signed)
Date:  07/21/2023 Time:  5:20 PM  Group Topic/Focus:  Activity Group:  The focus of this group is to encourage patiens to go outside to the courtyard and get some fresh air and some exercise.     Participation Level:  Did Not Attend    Denise Knight Denise Knight 07/21/2023, 5:20 PM

## 2023-07-21 NOTE — Group Note (Signed)
Date:  07/21/2023 Time:  9:22 PM  Group Topic/Focus:  Wrap-Up Group:   The focus of this group is to help patients review their daily goal of treatment and discuss progress on daily workbooks.    Participation Level:  Minimal  Participation Quality:  Appropriate  Affect:  Appropriate  Cognitive:  Alert and Appropriate  Insight: Appropriate  Engagement in Group:  Limited  Modes of Intervention:  Discussion  Additional Comments:     Maglione,Kieren Adkison E 07/21/2023, 9:22 PM

## 2023-07-21 NOTE — Progress Notes (Signed)
Patient is asleep, so this writer will administer scheduled morning medication once she wakes up. Provider will be notified.

## 2023-07-21 NOTE — Progress Notes (Signed)
   07/21/23 1950  Psych Admission Type (Psych Patients Only)  Admission Status Voluntary  Psychosocial Assessment  Patient Complaints Anxiety  Eye Contact Fair  Facial Expression Anxious  Affect Appropriate to circumstance  Speech Logical/coherent  Interaction Assertive  Motor Activity Slow  Appearance/Hygiene Unremarkable  Behavior Characteristics Cooperative;Appropriate to situation;Anxious  Mood Pleasant  Thought Process  Coherency WDL  Content WDL  Delusions None reported or observed  Perception WDL  Hallucination None reported or observed  Judgment WDL  Confusion None  Danger to Self  Current suicidal ideation? Denies  Danger to Others  Danger to Others None reported or observed   Progress note   D: Pt seen in dayroom. Pt denies SI, HI, AVH. Pt rates pain 10/10 as chronic pain in her knees. Pt rates anxiety  8.5/10 and depression  0/10. Pt states that her anxiety is due to the mania of another patient. Has a positive outlook and is cooperative. No other concerns noted at this time.  A: Pt provided support and encouragement. Pt given scheduled medication as prescribed. PRNs as appropriate. Q15 min checks for safety.   R: Pt safe on the unit. Will continue to monitor.

## 2023-07-21 NOTE — Progress Notes (Signed)
Patient is alert no distress noted interacting appropriately with peers and staff, she expressed some agitation earlier in the shift because another patient was using racial slurs toward her. Patient was offered the agitation protocol, he was receptive and she ws noted less agitated. Patient currently denies SI/HI/AVH , 15 minutes safety checks maintained will continue to monitor.

## 2023-07-21 NOTE — Group Note (Signed)
Recreation Therapy Group Note   Group Topic:General Recreation  Group Date: 07/21/2023 Start Time: 1000 End Time: 1100 Facilitators: Rosina Lowenstein, LRT, CTRS Location: Courtyard  Group Description: Outdoor Recreation. Patients had the option to play basketball, corn hole, or draw with chalk and listen to music while outside in the courtyard getting fresh air and sunlight. LRT and pts discussed things that they enjoy doing in their free time outside of the hospital.  Goal Area(s) Addressed: Patient will identify leisure interests.  Patient will practice healthy decision making. Patient will engage in recreation activity.   Affect/Mood: N/A   Participation Level: Did not attend    Clinical Observations/Individualized Feedback: Denise Knight did not attend group.  Plan: Continue to engage patient in RT group sessions 2-3x/week.   Rosina Lowenstein, LRT, CTRS 07/21/2023 12:18 PM

## 2023-07-21 NOTE — Progress Notes (Signed)
D- Patient alert and oriented. Patient presented in a pleasant mood on assessment stating that she slept ok last night, and had complaints of knee pain. Patient rated her pain a "10/10", in which she requested PRN muscle relaxer, which has since been discontinued. This Clinical research associate reached out to provider for new orders.  Patient continues to endorse both depression and anxiety, but reports nothing in particular has her feeling this way. Patient denied SI, HI, AVH at this time. Patient had no stated goals for today.  A- Scheduled medications administered to patient, per MD orders. Support and encouragement provided. Routine safety checks conducted every 15 minutes. Patient informed to notify staff with problems or concerns.  R- No adverse drug reactions noted. Patient contracts for safety at this time. Patient compliant with medications. Patient receptive, calm, and cooperative. Patient interacts well with others on the unit, but has been in bed majority of the day sleeping. Patient remains safe at this time.   07/21/23 1159  Psych Admission Type (Psych Patients Only)  Admission Status Voluntary  Psychosocial Assessment  Patient Complaints Anxiety;Depression  Eye Contact Fair  Facial Expression Sullen  Affect Sullen  Speech Logical/coherent  Interaction Assertive;Isolative (pt. isolates to room, except for meals and medication; doesn't participate in unit group therapies.)  Motor Activity Slow  Appearance/Hygiene In scrubs;Unremarkable  Behavior Characteristics Appropriate to situation  Mood Pleasant  Aggressive Behavior  Effect No apparent injury  Thought Process  Coherency Circumstantial  Content WDL  Delusions None reported or observed  Perception WDL  Hallucination None reported or observed  Judgment WDL  Confusion None  Danger to Self  Current suicidal ideation? Denies  Danger to Others  Danger to Others None reported or observed  Danger to Others Abnormal  Harmful Behavior to  others No threats or harm toward other people  Destructive Behavior No threats or harm toward property

## 2023-07-21 NOTE — Plan of Care (Signed)
  Problem: Education: Goal: Knowledge of General Education information will improve Description: Including pain rating scale, medication(s)/side effects and non-pharmacologic comfort measures Outcome: Not Progressing   Problem: Health Behavior/Discharge Planning: Goal: Ability to manage health-related needs will improve Outcome: Not Progressing   Problem: Clinical Measurements: Goal: Ability to maintain clinical measurements within normal limits will improve Outcome: Not Progressing Goal: Will remain free from infection Outcome: Not Progressing Goal: Diagnostic test results will improve Outcome: Not Progressing Goal: Respiratory complications will improve Outcome: Not Progressing Goal: Cardiovascular complication will be avoided Outcome: Not Progressing   Problem: Activity: Goal: Risk for activity intolerance will decrease Outcome: Not Progressing   Problem: Nutrition: Goal: Adequate nutrition will be maintained Outcome: Not Progressing   Problem: Coping: Goal: Level of anxiety will decrease Outcome: Not Progressing   Problem: Elimination: Goal: Will not experience complications related to bowel motility Outcome: Not Progressing Goal: Will not experience complications related to urinary retention Outcome: Not Progressing   Problem: Pain Managment: Goal: General experience of comfort will improve Outcome: Not Progressing   Problem: Safety: Goal: Ability to remain free from injury will improve Outcome: Not Progressing   Problem: Skin Integrity: Goal: Risk for impaired skin integrity will decrease Outcome: Not Progressing   Problem: Education: Goal: Knowledge of [place] General Education information/materials will improve Outcome: Not Progressing Goal: Emotional status will improve Outcome: Not Progressing Goal: Mental status will improve Outcome: Not Progressing Goal: Verbalization of understanding the information provided will improve Outcome: Not  Progressing   Problem: Activity: Goal: Interest or engagement in activities will improve Outcome: Not Progressing Goal: Sleeping patterns will improve Outcome: Not Progressing   Problem: Coping: Goal: Ability to verbalize frustrations and anger appropriately will improve Outcome: Not Progressing Goal: Ability to demonstrate self-control will improve Outcome: Not Progressing   Problem: Health Behavior/Discharge Planning: Goal: Identification of resources available to assist in meeting health care needs will improve Outcome: Not Progressing Goal: Compliance with treatment plan for underlying cause of condition will improve Outcome: Not Progressing   Problem: Physical Regulation: Goal: Ability to maintain clinical measurements within normal limits will improve Outcome: Not Progressing   Problem: Safety: Goal: Periods of time without injury will increase Outcome: Not Progressing   

## 2023-07-21 NOTE — Plan of Care (Signed)
  Problem: Clinical Measurements: Goal: Will remain free from infection Outcome: Progressing   Problem: Education: Goal: Knowledge of General Education information will improve Description: Including pain rating scale, medication(s)/side effects and non-pharmacologic comfort measures Outcome: Progressing   

## 2023-07-21 NOTE — [facility] Counselor (Signed)
CSW was informed that patient has been ACCEPTED to [facility] program in [place].  CSW to call back with discharge date so bed date can be provided.  CSW to follow up.  Penni Homans, MSW, LCSW 07/21/2023 2:00 PM

## 2023-07-22 DIAGNOSIS — F314 Bipolar disorder, current episode depressed, severe, without psychotic features: Secondary | ICD-10-CM | POA: Diagnosis not present

## 2023-07-22 NOTE — Group Note (Signed)
Date:  07/22/2023 Time:  10:08 AM  Group Topic/Focus:  Goals Group:   The focus of this group is to help patients establish daily goals to achieve during treatment and discuss how the patient can incorporate goal setting into their daily lives to aide in recovery. Outdoor Recreation Structured Pharmacist, community.    Participation Level:  Did Not Attend   Terrisa Curfman 07/22/2023, 10:08 AM

## 2023-07-22 NOTE — Plan of Care (Signed)
Pt compliant with procedures on the unit, denies SI/HI/AVH  Problem: Education: Goal: Knowledge of General Education information will improve Description: Including pain rating scale, medication(s)/side effects and non-pharmacologic comfort measures Outcome: Progressing   Problem: Health Behavior/Discharge Planning: Goal: Ability to manage health-related needs will improve Outcome: Progressing   Problem: Clinical Measurements: Goal: Ability to maintain clinical measurements within normal limits will improve Outcome: Progressing Goal: Will remain free from infection Outcome: Progressing Goal: Diagnostic test results will improve Outcome: Progressing Goal: Respiratory complications will improve Outcome: Progressing Goal: Cardiovascular complication will be avoided Outcome: Progressing   Problem: Activity: Goal: Risk for activity intolerance will decrease Outcome: Progressing   Problem: Nutrition: Goal: Adequate nutrition will be maintained Outcome: Progressing   Problem: Coping: Goal: Level of anxiety will decrease Outcome: Progressing   Problem: Elimination: Goal: Will not experience complications related to bowel motility Outcome: Progressing Goal: Will not experience complications related to urinary retention Outcome: Progressing   Problem: Pain Managment: Goal: General experience of comfort will improve Outcome: Progressing   Problem: Safety: Goal: Ability to remain free from injury will improve Outcome: Progressing   Problem: Skin Integrity: Goal: Risk for impaired skin integrity will decrease Outcome: Progressing   Problem: Education: Goal: Knowledge of [place] [facility] Education information/materials will improve Outcome: Progressing Goal: Emotional status will improve Outcome: Progressing Goal: Mental status will improve Outcome: Progressing Goal: Verbalization of understanding the information provided will improve Outcome: Progressing    Problem: Activity: Goal: Interest or engagement in activities will improve Outcome: Progressing Goal: Sleeping patterns will improve Outcome: Progressing   Problem: Coping: Goal: Ability to verbalize frustrations and anger appropriately will improve Outcome: Progressing Goal: Ability to demonstrate self-control will improve Outcome: Progressing   Problem: Health Behavior/Discharge Planning: Goal: Identification of resources available to assist in meeting health care needs will improve Outcome: Progressing Goal: Compliance with treatment plan for underlying cause of condition will improve Outcome: Progressing   Problem: Physical Regulation: Goal: Ability to maintain clinical measurements within normal limits will improve Outcome: Progressing   Problem: Safety: Goal: Periods of time without injury will increase Outcome: Progressing

## 2023-07-22 NOTE — Plan of Care (Signed)
D- Patient alert and oriented. Patient in a pleasant mood on assessment. She mentioned that she slept ok last night and had complaints of bilateral knee pain that is chronic . Denies SI, HI, AVH. Patient stated that her anxiety is at 8/10. Goal for today is to not have suicidal thoughts.  A- Scheduled medications administered to patient, per MD orders. Support and encouragement provided.  Routine safety checks conducted every 15 minutes. Patient informed to notify staff with problems or concerns.  R- No adverse drug reactions noted. Patient contracts for safety at this time. Patient compliant with medications and treatment plan. Patient receptive, calm, and cooperative. Patient interacts well with others on the unit.  Patient remains safe at this time.   Problem: Education: Goal: Knowledge of General Education information will improve Description: Including pain rating scale, medication(s)/side effects and non-pharmacologic comfort measures Outcome: Progressing   Problem: Health Behavior/Discharge Planning: Goal: Ability to manage health-related needs will improve Outcome: Progressing   Problem: Clinical Measurements: Goal: Ability to maintain clinical measurements within normal limits will improve Outcome: Progressing Goal: Will remain free from infection Outcome: Progressing Goal: Diagnostic test results will improve Outcome: Progressing Goal: Respiratory complications will improve Outcome: Progressing Goal: Cardiovascular complication will be avoided Outcome: Progressing   Problem: Activity: Goal: Risk for activity intolerance will decrease Outcome: Progressing   Problem: Nutrition: Goal: Adequate nutrition will be maintained Outcome: Progressing   Problem: Coping: Goal: Level of anxiety will decrease Outcome: Progressing   Problem: Elimination: Goal: Will not experience complications related to bowel motility Outcome: Progressing Goal: Will not experience complications  related to urinary retention Outcome: Progressing   Problem: Pain Managment: Goal: General experience of comfort will improve Outcome: Progressing   Problem: Safety: Goal: Ability to remain free from injury will improve Outcome: Progressing   Problem: Skin Integrity: Goal: Risk for impaired skin integrity will decrease Outcome: Progressing   Problem: Education: Goal: Knowledge of [place] [facility] Education information/materials will improve Outcome: Progressing Goal: Emotional status will improve Outcome: Progressing Goal: Mental status will improve Outcome: Progressing Goal: Verbalization of understanding the information provided will improve Outcome: Progressing   Problem: Activity: Goal: Interest or engagement in activities will improve Outcome: Progressing Goal: Sleeping patterns will improve Outcome: Progressing   Problem: Coping: Goal: Ability to verbalize frustrations and anger appropriately will improve Outcome: Progressing Goal: Ability to demonstrate self-control will improve Outcome: Progressing   Problem: Health Behavior/Discharge Planning: Goal: Identification of resources available to assist in meeting health care needs will improve Outcome: Progressing Goal: Compliance with treatment plan for underlying cause of condition will improve Outcome: Progressing   Problem: Physical Regulation: Goal: Ability to maintain clinical measurements within normal limits will improve Outcome: Progressing   Problem: Safety: Goal: Periods of time without injury will increase Outcome: Progressing

## 2023-07-22 NOTE — BHH Counselor (Incomplete Revision)
CSW again attempted to call [facility]  CSW spoke with Denny Peon.  Denny Peon reports that another staff member will call this CSW back and reports that she will provide this writers contact information to that staff.  CSW is awaiting call back.  Penni Homans, MSW, LCSW 07/22/2023 4:24 PM    CSW contacted [facility]).  CSW was informed that clinicals have been uploaded, however not reviewed. WTC reports that [place], Haematologist, will be call this Clinical research associate after 11:00AM  Penni Homans, MSW, LCSW 07/22/2023 9:48 AM

## 2023-07-22 NOTE — BHH Counselor (Signed)
CSW was informed that patient will likely be accepted.  CSW called to confirm. No answer at this time.  Lindsey with W[facility] to call back. Penni Homans, MSW, LCSW 07/23/2023 10:32 AM    CSW again attempted to call [facility].  CSW spoke with Denny Peon.  Denny Peon reports that another staff member will call this CSW back and reports that she will provide this writers contact information to that staff.  CSW is awaiting call back.  Penni Homans, MSW, LCSW 07/22/2023 4:24 PM    CSW contacted [facility]).  CSW was informed that clinicals have been uploaded, however not reviewed. WTC reports that [place], Haematologist, will be call this Clinical research associate after 11:00AM  Penni Homans, MSW, LCSW 07/22/2023 9:48 AM

## 2023-07-22 NOTE — Group Note (Signed)
[facility] LCSW Group Therapy Note   Group Date: 07/22/2023 Start Time: 1300 End Time: 1400  Type of Therapy/Topic:  Group Therapy:  Feelings about Diagnosis  Participation Level:  Minimal    Description of Group:    This group will allow patients to explore their thoughts and feelings about diagnoses they have received. Patients will be guided to explore their level of understanding and acceptance of these diagnoses. Facilitator will encourage patients to process their thoughts and feelings about the reactions of others to their diagnosis, and will guide patients in identifying ways to discuss their diagnosis with significant others in their lives. This group will be process-oriented, with patients participating in exploration of their own experiences as well as giving and receiving support and challenge from other group members.   Therapeutic Goals: 1. Patient will demonstrate understanding of diagnosis as evidence by identifying two or more symptoms of the disorder:  2. Patient will be able to express two feelings regarding the diagnosis 3. Patient will demonstrate ability to communicate their needs through discussion and/or role plays  Summary of Patient Progress: Patient was present for the entirety of the group process. She shared during the icebreaker but that was the extent of her participation in the group process. Although she did not participate in the discussion she did appear to attend to the conversation. Insight remains questionable due to lack of participation in the discussion.    Therapeutic Modalities:   Cognitive Behavioral Therapy Brief Therapy Feelings Identification    Glenis Smoker, LCSW

## 2023-07-22 NOTE — Group Note (Signed)
Date:  07/22/2023 Time:  4:11 PM  Group Topic/Focus:  Outdoor recreation. Music Therapy [place] building.    Participation Level:  Did Not Attend    Rosaura Carpenter 07/22/2023, 4:11 PM

## 2023-07-22 NOTE — Group Note (Signed)
Recreation Therapy Group Note   Group Topic:Problem Solving  Group Date: 07/22/2023 Start Time: 1000 End Time: 1055 Facilitators: Rosina Lowenstein, LRT, CTRS Location: Craft Room  Group Description: Life Boat. Patients were given the scenario that they are on a boat that is about to become shipwrecked, leaving them stranded on an [place]. They are asked to make a list of 15 different items that they want to take with them when they are stranded on the [place]. Patients are asked to rank their items from most important to least important, #1 being the most important and #15 being the least. Patients will work individually for the first round to come up with 15 items and then pair up with a peer(s) to condense their list and come up with one list of 15 items between the two of them. Patients or LRT will read aloud the 15 different items to the group after each round. LRT facilitated post-activity processing to discuss how this activity can be used in daily life post discharge.   Goal Area(s) Addressed:  Patient will identify priorities, wants and needs. Patient will communicate with LRT and peers. Patient will work collectively as a Administrator, Civil Service. Patient will work on Product manager.    Affect/Mood: N/A   Participation Level: Did not attend    Clinical Observations/Individualized Feedback: [place] did not attend group.  Plan: Continue to engage patient in RT group sessions 2-3x/week.   Rosina Lowenstein, LRT, CTRS 07/22/2023 11:05 AM

## 2023-07-22 NOTE — Progress Notes (Signed)
Pt calm and pleasant during assessment denying SI/HI/AVH. Pt observed interacting appropriately with staff and peers on the unit. Pt compliant with medication administration per MD orders. Pt given education, support, and encouragement to be active in her treatment plan. Pt being monitored Q 15 minutes for safety per unit protocol, remains safe on the unit

## 2023-07-22 NOTE — Progress Notes (Signed)
[facility] MD Progress Note  07/22/2023 9:43 AM Denise Knight  MRN:  [id number]  Subjective:  Pt chart reviewed, discussed with interdisciplinary team, and seen on rounds. Reports mood is "alright". Reports fair sleep and appetite. Is still interested in residential substance use treatment. Has been accepted to [facility]. States she is willing to go to [facility] but is hoping for [facility] [employer] instead. Per social work, referral sent to [facility] yesterday, will follow up. Denies suicidal, homicidal ideations. Denies auditory visual hallucinations or paranoia.  Principal Problem: Bipolar affective disorder, depressed, severe (HCC) Diagnosis: Principal Problem:   Bipolar affective disorder, depressed, severe (HCC) Active Problems:   Polysubstance abuse (HCC)   PTSD (post-traumatic stress disorder)  Total Time spent with patient:  25 minutes  Past Psychiatric History: bipolar d/o, ptsd, substance abuse  Past Medical History:  Past Medical History:  Diagnosis Date   Allergy    seasonal   Anxiety    Arthritis    "left knee" (07/05/2016)   Asthma    Bipolar 1 disorder (HCC)    Depression    Diabetes mellitus without complication (HCC)    pre-   GERD (gastroesophageal reflux disease)    Hypertension    MI (mitral incompetence)    Post traumatic stress disorder    Schizophrenia (HCC)    Stomach ulcer    Substance abuse (HCC)    cocaine quit july 2020   Suicide attempt [facility]     Past Surgical History:  Procedure Laterality Date   CARDIAC CATHETERIZATION  07/05/2016   CARDIAC CATHETERIZATION N/A 07/05/2016   Procedure: Left Heart Cath and Coronary Angiography;  Surgeon: Yates Decamp, MD;  Location: [facility] INVASIVE CV LAB;  Service: Cardiovascular;  Laterality: N/A;   CYST EXCISION Right    "wrist"   DILATION AND CURETTAGE OF UTERUS     FOOT SURGERY Bilateral    "corns removed"   LEFT HEART CATH AND CORONARY ANGIOGRAPHY N/A 12/24/2017   Procedure: LEFT HEART CATH  AND CORONARY ANGIOGRAPHY;  Surgeon: Elder Negus, MD;  Location: [facility] INVASIVE CV LAB;  Service: Cardiovascular;  Laterality: N/A;   TUBAL LIGATION     Family History:  Family History  Problem Relation Age of Onset   Lung cancer Other    Congestive Heart Failure Other    Diabetes Other    Hypertension Other    Colon cancer Neg Hx    Colon polyps Neg Hx    Esophageal cancer Neg Hx    Stomach cancer Neg Hx    Rectal cancer Neg Hx    Family Psychiatric  History: none Social History:  Social History   Substance and Sexual Activity  Alcohol Use Yes   Alcohol/week: 7.0 standard drinks of alcohol   Types: 7 Cans of beer per week     Social History   Substance and Sexual Activity  Drug Use Yes   Types: Marijuana    Social History   Socioeconomic History   Marital status: Widowed    Spouse name: Not on file   Number of children: 2   Years of education: Not on file   Highest education level: Not on file  Occupational History   Occupation: disabled   Tobacco Use   Smoking status: Some Days    Current packs/day: 0.00    Average packs/day: 0.3 packs/day for 35.0 years (8.8 ttl pk-yrs)    Types: Cigarettes    Start date: 05/29/1984    Last attempt to quit: 05/30/2019  Years since quitting: 4.1   Smokeless tobacco: Never  Vaping Use   Vaping status: Never Used  Substance and Sexual Activity   Alcohol use: Yes    Alcohol/week: 7.0 standard drinks of alcohol    Types: 7 Cans of beer per week   Drug use: Yes    Types: Marijuana   Sexual activity: Yes    Birth control/protection: Surgical  Other Topics Concern   Not on file  Social History Narrative   Not on file   Social Determinants of Health   Financial Resource Strain: Not on File (02/28/2022)   Received from [facility] [place] Strain    Financial Resource Strain: 0  Recent Concern: Physicist, medical Strain - Medium Risk (02/11/2022)   [facility]    Overall Financial Resource Strain (CARDIA)    Difficulty of Paying Living Expenses: Somewhat hard  Food Insecurity: No Food Insecurity (07/17/2023)   Hunger Vital Sign    Worried About Running Out of Food in the Last Year: Never true    Ran Out of Food in the Last Year: Never true  Transportation Needs: No Transportation Needs (07/17/2023)   PRAPARE - Administrator, Civil Service (Medical): No    Lack of Transportation (Non-Medical): No  Physical Activity: Not on File (02/28/2022)   Received from [place], [place]   Physical Activity    Physical Activity: 0  Stress: Not on File (02/28/2022)   Received from [facility], [place]   Stress    Stress: 0  Social Connections: Not on File (07/16/2023)   Received from [facility]   Social Connections    Connectedness: 0   Additional Social History:                         Sleep: Fair  Appetite:  Fair  Current Medications: Current Facility-Administered Medications  Medication Dose Route Frequency Provider Last Rate Last Admin   acetaminophen (TYLENOL) tablet 650 mg  650 mg Oral Q6H PRN Weber, Kyra A, NP   650 mg at 07/21/23 2110   albuterol (VENTOLIN HFA) 108 (90 Base) MCG/ACT inhaler 1-2 puff  1-2 puff Inhalation Q6H PRN Weber, Kyra A, NP   2 puff at 07/21/23 2111   alum & mag hydroxide-simeth (MAALOX/MYLANTA) 200-200-20 MG/5ML suspension 30 mL  30 mL Oral Q4H PRN Weber, Kyra A, NP       amLODipine (NORVASC) tablet 10 mg  10 mg Oral Daily Charm Rings, NP   10 mg at 07/22/23 0834   diphenhydrAMINE (BENADRYL) capsule 50 mg  50 mg Oral TID PRN Phebe Colla A, NP   50 mg at 07/20/23 2109   Or   diphenhydrAMINE (BENADRYL) injection 50 mg  50 mg Intramuscular TID PRN Weber, Kyra A, NP       fluticasone (FLONASE) 50 MCG/ACT nasal spray 1 spray  1 spray Each Nare Daily Weber, Kyra A, NP   1 spray at 07/19/23 0914   folic acid (FOLVITE) tablet 1 mg  1 mg Oral Daily Weber, Kyra A, NP   1 mg at 07/22/23 0834   haloperidol (HALDOL) tablet 5 mg  5  mg Oral TID PRN Phebe Colla A, NP   5 mg at 07/20/23 2109   Or   haloperidol lactate (HALDOL) injection 5 mg  5 mg Intramuscular TID PRN Weber, Kyra A, NP       lurasidone (LATUDA) tablet 20 mg  20  mg Oral Q breakfast Lauree Chandler, NP   20 mg at 07/22/23 0865   magnesium hydroxide (MILK OF MAGNESIA) suspension 30 mL  30 mL Oral Daily PRN Phebe Colla A, NP       multivitamin with minerals tablet 1 tablet  1 tablet Oral Daily Weber, Kyra A, NP   1 tablet at 07/22/23 0834   naltrexone (DEPADE) tablet 50 mg  50 mg Oral Daily Charm Rings, NP   50 mg at 07/22/23 0834   ondansetron (ZOFRAN-ODT) disintegrating tablet 4 mg  4 mg Oral Q12H PRN Lauree Chandler, NP       pantoprazole (PROTONIX) EC tablet 40 mg  40 mg Oral Q breakfast Lauree Chandler, NP   40 mg at 07/22/23 7846   phenylephrine-shark liver oil-mineral oil-petrolatum (PREPARATION H) rectal ointment 1 Application  1 Application Rectal BID PRN Lauree Chandler, NP       prazosin (MINIPRESS) capsule 1 mg  1 mg Oral QHS Lauree Chandler, NP   1 mg at 07/21/23 2110   thiamine (VITAMIN B1) tablet 100 mg  100 mg Oral Daily Weber, Kyra A, NP   100 mg at 07/22/23 0834   traZODone (DESYREL) tablet 100 mg  100 mg Oral QHS Weber, Kyra A, NP   100 mg at 07/21/23 2110    Lab Results: No results found for this or any previous visit (from the past 48 hour(s)).  Blood Alcohol level:  Lab Results  Component Value Date   ETH <10 07/16/2023   ETH <10 01/12/2023    Metabolic Disorder Labs: Lab Results  Component Value Date   HGBA1C 5.8 (H) 07/17/2023   MPG 119.76 07/17/2023   MPG 116.89 09/03/2021   No results found for: "PROLACTIN" Lab Results  Component Value Date   CHOL 166 07/17/2023   TRIG 203 (H) 07/17/2023   HDL 33 (L) 07/17/2023   CHOLHDL 5.0 07/17/2023   VLDL 41 (H) 07/17/2023   LDLCALC 92 07/17/2023   LDLCALC 146 (H) 09/03/2021    Physical Findings: AIMS:  , ,  ,  ,    CIWA:  CIWA-Ar Total: 8 COWS:      Musculoskeletal: Strength & Muscle Tone: within normal limits Gait & Station: normal Patient leans: N/A  Psychiatric Specialty Exam:  Presentation  General Appearance:  Appropriate for Environment  Eye Contact: Fair  Speech: Clear and Coherent; Normal Rate  Speech Volume: Normal  Handedness: Right   Mood and Affect  Mood: -- ("alright")  Affect: Flat   Thought Process  Thought Processes: Coherent; Goal Directed; Linear  Descriptions of Associations:Intact  Orientation:Full (Time, Place and Person)  Thought Content:Logical  History of Schizophrenia/Schizoaffective disorder:No data recorded Duration of Psychotic Symptoms:No data recorded Hallucinations:Hallucinations: None  Ideas of Reference:None  Suicidal Thoughts:Suicidal Thoughts: No  Homicidal Thoughts:Homicidal Thoughts: No   Sensorium  Memory: Immediate Good; Recent Good; Remote Good  Judgment: Intact  Insight: Present   Executive Functions  Concentration: Fair  Attention Span: Fair  Recall: Fair  Fund of Knowledge: Good  Language: Good   Psychomotor Activity  Psychomotor Activity: Psychomotor Activity: Normal   Assets  Assets: Communication Skills; Desire for Improvement; Financial Resources/Insurance; Housing; Leisure Time; Resilience; Social Support   Sleep  Sleep: Sleep: Fair    Physical Exam: Physical Exam Constitutional:      General: She is not in acute distress.    Appearance: She is not ill-appearing, toxic-appearing or diaphoretic.  Eyes:     General: No  scleral icterus. Cardiovascular:     Rate and Rhythm: Normal rate.  Pulmonary:     Effort: Pulmonary effort is normal. No respiratory distress.  Neurological:     Mental Status: She is alert and oriented to person, place, and time.  Psychiatric:        Attention and Perception: Attention and perception normal.        Mood and Affect: Mood normal. Affect is flat.        Speech: Speech  normal.        Behavior: Behavior normal. Behavior is cooperative.        Thought Content: Thought content normal.        Cognition and Memory: Cognition and memory normal.        Judgment: Judgment normal.    Review of Systems  Constitutional:  Negative for chills and fever.  Respiratory:  Negative for shortness of breath.   Cardiovascular:  Negative for chest pain and palpitations.  Gastrointestinal:  Negative for abdominal pain.  Neurological:  Negative for headaches.  Psychiatric/Behavioral: Negative.     Blood pressure 115/77, pulse 65, temperature (!) 97.3 F (36.3 C), resp. rate 16, SpO2 100%. There is no height or weight on file to calculate BMI.   Treatment Plan Summary: Daily contact with patient to assess and evaluate symptoms and progress in treatment, Medication management, and Plan    Bipolar 1 disorder -Latuda 20mg  oral daily with breakfast Lipid panel with HDL 33 L, and triglycerides 203 H and VLDL 41 H A1C 5.8  TSH 1.141  Qtc 442   PTSD -Minipress 1mg  oral daily at bedtime   Alcohol withdrawal -CIWA with PRN ativan -Naltrexone 50 mg daily started Accepted to [facility] Social work to follow up with [facility] [employer]   Insomnia -Trazodone 100mg  oral daily at bedtime   GERD -Protonix 40mg  oral daily with breakfast   Nutritional supplements -Folvite 1mg  oral daily -Multivitamin with minerals 1 tablet oral daily -Thiamine 100mg  oral daily   Allergic rhinitis -Flonase 1 spray each nare, daily   PRNs -Tylenol 650mg  oral every 6 hours PRN mild pain -Ventolin HFA 1-2 puff, inhalation, every 6 hours PRN, wheezing, shortness of breath -Maalox/Mylanta 30mL oral every 4 hours PRN indigestion -Benadryl 50mg  oral or IM 3 times daily PRN agitation -Haldol 5mg  oral or IM 3 times daily PRN agitation -Ativan 2mg  oral or IM 3 times daily PRN agitation -Milk of Magnesia 30mL oral daily PRN -Zofran ODT 4mg  oral every 12 hours PRN nausea,  vomiting -Preparation H 1 application rectal 2 times daily PRN hemorrhoids  Lauree Chandler, NP 07/22/2023, 9:43 AM

## 2023-07-22 NOTE — BHH Counselor (Signed)
CSW contacted [facility] who report that the patient can be accepted tomorrow 07/23/23 by Jonette Pesa, MSW, LCSW 07/22/2023 9:49 AM

## 2023-07-22 NOTE — Group Note (Signed)
Date:  07/22/2023 Time:  9:27 PM  Group Topic/Focus:  Personal Choices and Values:   The focus of this group is to help patients assess and explore the importance of values in their lives, how their values affect their decisions, how they express their values and what opposes their expression.    Participation Level:  Active  Participation Quality:  Appropriate and Attentive  Affect:  Appropriate and Excited  Cognitive:  Alert and Appropriate  Insight: Appropriate, Good, and Improving  Engagement in Group:  Developing/Improving and Engaged  Modes of Intervention:  Activity, Clarification, Discussion, Exploration, Problem-solving, Rapport Building, Dance movement psychotherapist, Socialization, and Support  Additional Comments:     Shandee Jergens 07/22/2023, 9:27 PM

## 2023-07-22 NOTE — Plan of Care (Signed)
  Problem: Safety: Goal: Ability to remain free from injury will improve Outcome: Progressing   

## 2023-07-23 ENCOUNTER — Inpatient Hospital Stay

## 2023-07-23 ENCOUNTER — Other Ambulatory Visit: Payer: Self-pay | Admitting: Family Medicine

## 2023-07-23 DIAGNOSIS — R109 Unspecified abdominal pain: Secondary | ICD-10-CM

## 2023-07-23 DIAGNOSIS — R10A0 Flank pain, unspecified side: Secondary | ICD-10-CM | POA: Insufficient documentation

## 2023-07-23 LAB — CBC WITH DIFFERENTIAL/PLATELET
Abs Immature Granulocytes: 0.01 10*3/uL (ref 0.00–0.07)
Basophils Absolute: 0.1 10*3/uL (ref 0.0–0.1)
Basophils Relative: 1 %
Eosinophils Absolute: 0.2 10*3/uL (ref 0.0–0.5)
Eosinophils Relative: 4 %
HCT: 37.6 % (ref 36.0–46.0)
Hemoglobin: 11.7 g/dL — ABNORMAL LOW (ref 12.0–15.0)
Immature Granulocytes: 0 %
Lymphocytes Relative: 32 %
Lymphs Abs: 1.9 10*3/uL (ref 0.7–4.0)
MCH: 26.1 pg (ref 26.0–34.0)
MCHC: 31.1 g/dL (ref 30.0–36.0)
MCV: 83.9 fL (ref 80.0–100.0)
Monocytes Absolute: 0.5 10*3/uL (ref 0.1–1.0)
Monocytes Relative: 9 %
Neutro Abs: 3.1 10*3/uL (ref 1.7–7.7)
Neutrophils Relative %: 54 %
Platelets: 270 10*3/uL (ref 150–400)
RBC: 4.48 MIL/uL (ref 3.87–5.11)
RDW: 15.9 % — ABNORMAL HIGH (ref 11.5–15.5)
WBC: 5.8 10*3/uL (ref 4.0–10.5)
nRBC: 0 % (ref 0.0–0.2)

## 2023-07-23 LAB — URINALYSIS, COMPLETE (UACMP) WITH MICROSCOPIC
Bilirubin Urine: NEGATIVE
Glucose, UA: NEGATIVE mg/dL
Ketones, ur: NEGATIVE mg/dL
Nitrite: NEGATIVE
Protein, ur: NEGATIVE mg/dL
Specific Gravity, Urine: 1.019 (ref 1.005–1.030)
pH: 5 (ref 5.0–8.0)

## 2023-07-23 LAB — COMPREHENSIVE METABOLIC PANEL
ALT: 16 U/L (ref 0–44)
AST: 19 U/L (ref 15–41)
Albumin: 3.4 g/dL — ABNORMAL LOW (ref 3.5–5.0)
Alkaline Phosphatase: 49 U/L (ref 38–126)
Anion gap: 7 (ref 5–15)
BUN: 13 mg/dL (ref 6–20)
CO2: 26 mmol/L (ref 22–32)
Calcium: 9.3 mg/dL (ref 8.9–10.3)
Chloride: 106 mmol/L (ref 98–111)
Creatinine, Ser: 1.18 mg/dL — ABNORMAL HIGH (ref 0.44–1.00)
GFR, Estimated: 54 mL/min — ABNORMAL LOW (ref >60)
Glucose, Bld: 102 mg/dL — ABNORMAL HIGH (ref 70–99)
Potassium: 4.6 mmol/L (ref 3.5–5.1)
Sodium: 139 mmol/L (ref 135–145)
Total Bilirubin: 0.3 mg/dL (ref 0.3–1.2)
Total Protein: 6.4 g/dL — ABNORMAL LOW (ref 6.5–8.1)

## 2023-07-23 LAB — TROPONIN I (HIGH SENSITIVITY): Troponin I (High Sensitivity): 5 ng/L (ref <18)

## 2023-07-23 MED ORDER — KETOROLAC TROMETHAMINE 60 MG/2ML IM SOLN
15.0000 mg | Freq: Once | INTRAMUSCULAR | Status: AC
  Administered 2023-07-23: 15 mg via INTRAMUSCULAR
  Filled 2023-07-23: qty 2

## 2023-07-23 MED ORDER — SODIUM CHLORIDE 0.9 % IV BOLUS: 1000.0000 mL | Freq: Once | INTRAVENOUS | Status: DC

## 2023-07-23 MED ORDER — ACETAMINOPHEN 325 MG PO TABS: 650.0000 mg | ORAL_TABLET | Freq: Four times a day (QID) | ORAL | Status: DC | PRN

## 2023-07-23 NOTE — Assessment & Plan Note (Signed)
No active chest pain at present Will check troponin x 1 in the setting of flank pain to rule out atypical etiology

## 2023-07-23 NOTE — Group Note (Signed)
Recreation Therapy Group Note   Group Topic:Healthy Support Systems  Group Date: 07/23/2023 Start Time: 1000 End Time: 1100 Facilitators: Rosina Lowenstein, LRT, CTRS Location:  Craft Room  Group Description: Straw Bridge.  Patients were given 10 plastic drinking straws and an equal length of masking tape. Using the materials provided, patients were instructed to build a free-standing bridge-like structure to suspend an everyday item (ex: deck of cards) off the floor or table surface. All materials were required to be used in Secondary school teacher. LRT facilitated post-activity discussion reviewing the importance of having strong and healthy support systems in our lives. LRT discussed how the people in our lives serve as the tape and the deck of cards we placed on top of our straw structure are the stressors we face in daily life. LRT and pts discussed what happens in our life when things get too heavy for [place], and we don't have strong supports outside of the hospital. Pt shared 2 of their healthy supports in their life aloud in the group.    Goal Area(s) Addressed:  Patient will identify 2 healthy supports in their life. Patient will identify skills to successfully complete activity. Patient will identify correlation of this activity to life post-discharge.  Patient will work on Product manager.    Affect/Mood: N/A   Participation Level: Did not attend    Clinical Observations/Individualized Feedback: Denise Knight did not attend group.  Plan: Continue to engage patient in RT group sessions 2-3x/week.   Rosina Lowenstein, LRT, CTRS 07/23/2023 11:11 AM

## 2023-07-23 NOTE — Assessment & Plan Note (Signed)
UDS positive for cocaine from prior labs Management per primary team

## 2023-07-23 NOTE — Group Note (Signed)
Date:  07/23/2023 Time:  8:37 PM  Group Topic/Focus:  Goals Group:   The focus of this group is to help patients establish daily goals to achieve during treatment and discuss how the patient can incorporate goal setting into their daily lives to aide in recovery.    Participation Level:  Active  Participation Quality:  Appropriate and Attentive  Affect:  Appropriate and Excited  Cognitive:  Alert and Appropriate  Insight: Appropriate, Good, and Improving  Engagement in Group:  Developing/Improving and Engaged  Modes of Intervention:  Discussion, Rapport Building, Socialization, and Support  Additional Comments:     Aleighya Mcanelly 07/23/2023, 8:37 PM

## 2023-07-23 NOTE — Group Note (Addendum)
LCSW Group Therapy Note  Group Date: 07/23/2023 Start Time: 1330 End Time: 1430   Type of Therapy and Topic:  Group Therapy: Anger Cues and Responses  Participation Level:  Did Not Attend   Description of Group:   In this group, patients learned how to recognize the physical, cognitive, emotional, and behavioral responses they have to anger-provoking situations.  They identified a recent time they became angry and how they reacted.  They analyzed how their reaction was possibly beneficial and how it was possibly unhelpful.  The group discussed a variety of healthier coping skills that could help with such a situation in the future.  Focus was placed on how helpful it is to recognize the underlying emotions to our anger, because working on those can lead to a more permanent solution as well as our ability to focus on the important rather than the urgent.  Therapeutic Goals: Patients will remember their last incident of anger and how they felt emotionally and physically, what their thoughts were at the time, and how they behaved. Patients will identify how their behavior at that time worked for them, as well as how it worked against them. Patients will explore possible new behaviors to use in future anger situations. Patients will learn that anger itself is normal and cannot be eliminated, and that healthier reactions can assist with resolving conflict rather than worsening situations.  Summary of Patient Progress:   X  Therapeutic Modalities:   Cognitive Behavioral Therapy    Harden Mo, LCSW 07/23/2023  3:38 PM

## 2023-07-23 NOTE — BHH Counselor (Signed)
Patient has been ACCEPTED to [facility].  [facility] is to schedule transportation on 07/24/2023.  Situation ongoing.  Penni Homans, MSW, LCSW 07/23/2023 11:40 AM

## 2023-07-23 NOTE — Group Note (Signed)
Date:  07/23/2023 Time:  5:24 PM  Group Topic/Focus:  Outdoor Recreation    Participation Level:  Active  Participation Quality:  Appropriate  Affect:  Appropriate  Cognitive:  Appropriate  Insight: Appropriate  Engagement in Group:  Engaged  Modes of Intervention:  Activity  Additional Comments:    Wilford Corner 07/23/2023, 5:24 PM

## 2023-07-23 NOTE — Plan of Care (Signed)
Assumed care of patient this am, she presented with a flat affect, mood seems stable, she c/o pain, (8/10) and tenderness to lower back bilaterally. Pt requested medication for anxiety, denied having thoughts, plan or intent for self harm. Pt spent most of the day isolated to her room, was pleasant and cooperative on approach. Pt also received an IM injection of Toradol 15 mg. Will continue to  monitor for safety and support around plan of care.

## 2023-07-23 NOTE — BH IP Treatment Plan (Signed)
Interdisciplinary Treatment and Diagnostic Plan Update  07/23/2023 Time of Session: 10:00AM Denise Knight MRN: [id number]  Principal Diagnosis: Bipolar affective disorder, depressed, severe (HCC)  Secondary Diagnoses: Principal Problem:   Bipolar affective disorder, depressed, severe (HCC) Active Problems:   NSTEMI (non-ST elevated myocardial infarction) (HCC)   Polysubstance abuse (HCC)   Asthma, chronic   Cocaine-induced mood disorder (HCC)   PTSD (post-traumatic stress disorder)   Flank pain, acute   Current Medications:  Current Facility-Administered Medications  Medication Dose Route Frequency Provider Last Rate Last Admin   acetaminophen (TYLENOL) tablet 650 mg  650 mg Oral Q6H PRN Weber, Kyra A, NP   650 mg at 07/23/23 0831   albuterol (VENTOLIN HFA) 108 (90 Base) MCG/ACT inhaler 1-2 puff  1-2 puff Inhalation Q6H PRN Weber, Kyra A, NP   2 puff at 07/23/23 0824   alum & mag hydroxide-simeth (MAALOX/MYLANTA) 200-200-20 MG/5ML suspension 30 mL  30 mL Oral Q4H PRN Weber, Kyra A, NP       amLODipine (NORVASC) tablet 10 mg  10 mg Oral Daily Charm Rings, NP   10 mg at 07/23/23 4098   diphenhydrAMINE (BENADRYL) capsule 50 mg  50 mg Oral TID PRN Phebe Colla A, NP   50 mg at 07/20/23 2109   Or   diphenhydrAMINE (BENADRYL) injection 50 mg  50 mg Intramuscular TID PRN Weber, Kyra A, NP       fluticasone (FLONASE) 50 MCG/ACT nasal spray 1 spray  1 spray Each Nare Daily Weber, Kyra A, NP   1 spray at 07/19/23 0914   folic acid (FOLVITE) tablet 1 mg  1 mg Oral Daily Weber, Kyra A, NP   1 mg at 07/23/23 1191   haloperidol (HALDOL) tablet 5 mg  5 mg Oral TID PRN Phebe Colla A, NP   5 mg at 07/20/23 2109   Or   haloperidol lactate (HALDOL) injection 5 mg  5 mg Intramuscular TID PRN Weber, Kyra A, NP       lurasidone (LATUDA) tablet 20 mg  20 mg Oral Q breakfast Lauree Chandler, NP   20 mg at 07/23/23 4782   magnesium hydroxide (MILK OF MAGNESIA) suspension 30 mL  30 mL Oral Daily PRN  Weber, Kyra A, NP       multivitamin with minerals tablet 1 tablet  1 tablet Oral Daily Weber, Kyra A, NP   1 tablet at 07/23/23 0823   naltrexone (DEPADE) tablet 50 mg  50 mg Oral Daily Charm Rings, NP   50 mg at 07/23/23 0958   ondansetron (ZOFRAN-ODT) disintegrating tablet 4 mg  4 mg Oral Q12H PRN Lauree Chandler, NP       pantoprazole (PROTONIX) EC tablet 40 mg  40 mg Oral Q breakfast Lauree Chandler, NP   40 mg at 07/23/23 0748   phenylephrine-shark liver oil-mineral oil-petrolatum (PREPARATION H) rectal ointment 1 Application  1 Application Rectal BID PRN Lauree Chandler, NP       prazosin (MINIPRESS) capsule 1 mg  1 mg Oral QHS Lauree Chandler, NP   1 mg at 07/22/23 2105   sodium chloride 0.9 % bolus 1,000 mL  1,000 mL Intravenous Once Floydene Flock, MD       thiamine (VITAMIN B1) tablet 100 mg  100 mg Oral Daily Weber, Kyra A, NP   100 mg at 07/23/23 0824   traZODone (DESYREL) tablet 100 mg  100 mg Oral QHS Weber, Kyra A, NP   100 mg  at 07/22/23 2105   PTA Medications: Medications Prior to Admission  Medication Sig Dispense Refill Last Dose   albuterol (VENTOLIN HFA) 108 (90 Base) MCG/ACT inhaler Inhale 1-2 puffs into the lungs every 6 (six) hours as needed for wheezing or shortness of breath. 1 each 0    fluticasone (FLONASE) 50 MCG/ACT nasal spray Place 1 spray into both nostrils daily.      melatonin 5 MG TABS Take 1 tablet (5 mg total) by mouth at bedtime as needed. 30 tablet 0    metroNIDAZOLE (FLAGYL) 500 MG tablet Take 500 mg by mouth 2 (two) times daily.      nystatin cream (MYCOSTATIN) Apply topically 2 (two) times daily. 30 g 0    pantoprazole (PROTONIX) 40 MG tablet Take 1 tablet (40 mg total) by mouth every morning. 30 tablet 0     Patient Stressors: Medication change or noncompliance   Substance abuse    Patient Strengths: Motivation for treatment/growth  Supportive family/friends   Treatment Modalities: Medication Management, Group therapy,  Case management,  1 to 1 session with clinician, Psychoeducation, Recreational therapy.   Physician Treatment Plan for Primary Diagnosis: Bipolar affective disorder, depressed, severe (HCC) Long Term Goal(s): Improvement in symptoms so as ready for discharge   Short Term Goals: Ability to identify changes in lifestyle to reduce recurrence of condition will improve Ability to verbalize feelings will improve Ability to disclose and discuss suicidal ideas Ability to demonstrate self-control will improve Ability to identify and develop effective coping behaviors will improve Ability to identify triggers associated with substance abuse/mental health issues will improve  Medication Management: Evaluate patient's response, side effects, and tolerance of medication regimen.  Therapeutic Interventions: 1 to 1 sessions, Unit Group sessions and Medication administration.  Evaluation of Outcomes: Progressing  Physician Treatment Plan for Secondary Diagnosis: Principal Problem:   Bipolar affective disorder, depressed, severe (HCC) Active Problems:   NSTEMI (non-ST elevated myocardial infarction) (HCC)   Polysubstance abuse (HCC)   Asthma, chronic   Cocaine-induced mood disorder (HCC)   PTSD (post-traumatic stress disorder)   Flank pain, acute  Long Term Goal(s): Improvement in symptoms so as ready for discharge   Short Term Goals: Ability to identify changes in lifestyle to reduce recurrence of condition will improve Ability to verbalize feelings will improve Ability to disclose and discuss suicidal ideas Ability to demonstrate self-control will improve Ability to identify and develop effective coping behaviors will improve Ability to identify triggers associated with substance abuse/mental health issues will improve     Medication Management: Evaluate patient's response, side effects, and tolerance of medication regimen.  Therapeutic Interventions: 1 to 1 sessions, Unit Group sessions  and Medication administration.  Evaluation of Outcomes: Progressing   RN Treatment Plan for Primary Diagnosis: Bipolar affective disorder, depressed, severe (HCC) Long Term Goal(s): Knowledge of disease and therapeutic regimen to maintain health will improve  Short Term Goals: Ability to demonstrate self-control, Ability to participate in decision making will improve, Ability to verbalize feelings will improve, Ability to disclose and discuss suicidal ideas, Ability to identify and develop effective coping behaviors will improve, and Compliance with prescribed medications will improve  Medication Management: RN will administer medications as ordered by provider, will assess and evaluate patient's response and provide education to patient for prescribed medication. RN will report any adverse and/or side effects to prescribing provider.  Therapeutic Interventions: 1 on 1 counseling sessions, Psychoeducation, Medication administration, Evaluate responses to treatment, Monitor vital signs and CBGs as ordered, Perform/monitor CIWA, COWS, AIMS  and Fall Risk screenings as ordered, Perform wound care treatments as ordered.  Evaluation of Outcomes: Progressing   LCSW Treatment Plan for Primary Diagnosis: Bipolar affective disorder, depressed, severe (HCC) Long Term Goal(s): Safe transition to appropriate next level of care at discharge, Engage patient in therapeutic group addressing interpersonal concerns.  Short Term Goals: Engage patient in aftercare planning with referrals and resources, Increase social support, Increase ability to appropriately verbalize feelings, Increase emotional regulation, Facilitate acceptance of mental health diagnosis and concerns, Facilitate patient progression through stages of change regarding substance use diagnoses and concerns, Identify triggers associated with mental health/substance abuse issues, and Increase skills for wellness and recovery  Therapeutic  Interventions: Assess for all discharge needs, 1 to 1 time with Social worker, Explore available resources and support systems, Assess for adequacy in community support network, Educate family and significant other(s) on suicide prevention, Complete Psychosocial Assessment, Interpersonal group therapy.  Evaluation of Outcomes: Progressing   Progress in Treatment: Attending groups: No. Participating in groups: Yes. Taking medication as prescribed: Yes. Toleration medication: Yes. Family/Significant other contact made: No, will contact:  once permission has been given. Patient understands diagnosis: Yes. Discussing patient identified problems/goals with staff: Yes. Medical problems stabilized or resolved: Yes. Denies suicidal/homicidal ideation: Yes. Issues/concerns per patient self-inventory: No. Other: none  New problem(s) identified: No, Describe:  none identified. Update 07/23/2023:  No changes at this time.    New Short Term/Long Term Goal(s): detox, elimination of symptoms of psychosis, medication management for mood stabilization; elimination of SI thoughts; development of comprehensive mental wellness/sobriety plan. Update 07/23/2023:  No changes at this time.    Patient Goals:  "Get into a program." Update 07/23/2023:  No changes at this time.    Discharge Plan or Barriers: CSW will assist pt with development of an appropriate aftercare/discharge plan. Update 07/23/2023:  CSW was able to assist patient in being admitted to [facility].  Patient is to discharge on 07/24/2023 to the facility.   Reason for Continuation of Hospitalization: Depression Medication stabilization Suicidal ideation Withdrawal symptoms   Estimated Length of Stay: 1-7 days  Update 07/23/2023:  expected d/c 07/24/23   Last 3 [place] Suicide Severity Risk Score: Flowsheet Row Admission (Current) from 07/16/2023 in [facility] INPATIENT BEHAVIORAL MEDICINE Most recent reading at 07/17/2023 12:06 AM ED from  07/16/2023 in [facility] Emergency Department at [facility] Most recent reading at 07/16/2023 10:35 AM ED from 05/11/2023 in [facility] Emergency Department at [facility] Most recent reading at 05/11/2023  9:01 AM  C-SSRS RISK CATEGORY High Risk High Risk No Risk       Last PHQ 2/9 Scores:    01/15/2023    9:33 AM 01/14/2023   12:25 PM 01/12/2023    5:55 PM  Depression screen PHQ 2/9  Decreased Interest 0 1 1  Down, Depressed, Hopeless 0 0 1  PHQ - 2 Score 0 1 2  Altered sleeping 0 1 3  Tired, decreased energy 0 0 1  Change in appetite 0 0 0  Feeling bad or failure about yourself  0 1 1  Trouble concentrating 0 0 3  Moving slowly or fidgety/restless 0 0 0  Suicidal thoughts 0 0 1  PHQ-9 Score 0 3 11  Difficult doing work/chores Not difficult at all Not difficult at all Somewhat difficult    Scribe for Treatment Team: Harden Mo, [place] 07/23/2023 4:07 PM

## 2023-07-23 NOTE — Consult Note (Addendum)
Initial Consultation Note   Patient: Denise Knight ZOX:[id number] DOB: 1965-12-12 PCP: Medicine, [facility] DOA: 07/16/2023 DOS: the patient was seen and examined on 07/23/2023 Primary service: Sarina Ill,*  Referring physician: Darrick Grinder NP  Reason for consult: Flank pain   Assessment/Plan: Assessment and Plan: * Bipolar affective disorder, depressed, severe (HCC) Management per primary team  Flank pain, acute Progressive left-sided flank pain over the past 3 to 4 days with noted prior history of kidney stones in the past Patient denies any active dysuria or increased urinary frequency Will check CT of the abdomen pelvis to better assess for now Urinalysis, CBC and CMP pending pending Pain control in the interim Will otherwise continue to monitor closely   Cocaine-induced mood disorder (HCC) UDS positive for cocaine from prior labs Management per primary team  Asthma, chronic Stable from a resp standpoint  Cont inhaler regimen    NSTEMI (non-ST elevated myocardial infarction) (HCC) No active chest pain at present Will check troponin x 1 in the setting of flank pain to rule out atypical etiology   Greater than 50% was spent in counseling and coordination of care with patient Total encounter time 61 minutes or more     TRH will sign off at present, please call us again when needed.  HPI: Denise Knight is a 57 y.o. female with past medical history of asthma, bipolar disorder, GERD, hypertension, CAD, substance abuse, bipolar disorder who we have been consulted to evaluate for flank pain.  Patient noted currently be admitted to behavioral health for bipolar disorder-severe.  Per report, patient states that she has had worsening left-sided flank pain over the past 3 days.  No fevers or chills.  No nausea or vomiting.  No chest pain.  No shortness of breath.  Does report prior symptoms associated with kidney stones in the past.  No dysuria.  No  diarrhea.  Symptoms have progressively worsened over the past 24 hours. Currently at behavioral health afebrile, hemodynamically stable.  CBC, CMP pending.  Review of Systems: As mentioned in the history of present illness. All other systems reviewed and are negative. Past Medical History:  Diagnosis Date   Allergy    seasonal   Anxiety    Arthritis    "left knee" (07/05/2016)   Asthma    Bipolar 1 disorder (HCC)    Depression    Diabetes mellitus without complication (HCC)    pre-   GERD (gastroesophageal reflux disease)    Hypertension    MI (mitral incompetence)    Post traumatic stress disorder    Schizophrenia (HCC)    Stomach ulcer    Substance abuse (HCC)    cocaine quit july 2020   Suicide attempt [facility])    Past Surgical History:  Procedure Laterality Date   CARDIAC CATHETERIZATION  07/05/2016   CARDIAC CATHETERIZATION N/A 07/05/2016   Procedure: Left Heart Cath and Coronary Angiography;  Surgeon: Yates Decamp, MD;  Location[facility] INVASIVE CV LAB;  Service: Cardiovascular;  Laterality: N/A;   CYST EXCISION Right    "wrist"   DILATION AND CURETTAGE OF UTERUS     FOOT SURGERY Bilateral    "corns removed"   LEFT HEART CATH AND CORONARY ANGIOGRAPHY N/A 12/24/2017   Procedure: LEFT HEART CATH AND CORONARY ANGIOGRAPHY;  Surgeon: Elder Negus, MD;  Location: [facility] INVASIVE CV LAB;  Service: Cardiovascular;  Laterality: N/A;   TUBAL LIGATION     Social History:  reports that she has been smoking cigarettes. She  started smoking about 39 years ago. She has a 8.8 pack-year smoking history. She has never used smokeless tobacco. She reports current alcohol use of about 7.0 standard drinks of alcohol per week. She reports current drug use. Drug: Marijuana.  Allergies  Allergen Reactions   Morphine And Codeine Hives   Aspirin Hives   Food Hives    "Regular butter"   Lactose Hives    Butter is the only dairy product that she has a reaction to   Other Hives    Regular  butter 05/30/2017 -   Tilactase     05/30/2017 -    BUTTER    Family History  Problem Relation Age of Onset   Lung cancer Other    Congestive Heart Failure Other    Diabetes Other    Hypertension Other    Colon cancer Neg Hx    Colon polyps Neg Hx    Esophageal cancer Neg Hx    Stomach cancer Neg Hx    Rectal cancer Neg Hx     Prior to Admission medications   Medication Sig Start Date End Date Taking? Authorizing Provider  albuterol (VENTOLIN HFA) 108 (90 Base) MCG/ACT inhaler Inhale 1-2 puffs into the lungs every 6 (six) hours as needed for wheezing or shortness of breath. 01/15/23   Carlyn Reichert, MD  fluticasone [facility]) 50 MCG/ACT nasal spray Place 1 spray into both nostrils daily. 04/15/23   [provider]  melatonin 5 MG TABS Take 1 tablet (5 mg total) by mouth at bedtime as needed. 01/15/23   Carlyn Reichert, MD  metroNIDAZOLE (FLAGYL) 500 MG tablet Take 500 mg by mouth 2 (two) times daily. 07/04/23   [provider]  nystatin cream (MYCOSTATIN) Apply topically 2 (two) times daily. 01/15/23   Carlyn Reichert, MD  pantoprazole (PROTONIX) 40 MG tablet Take 1 tablet (40 mg total) by mouth every morning. 01/15/23   Carlyn Reichert, MD  amantadine (SYMMETREL) 100 MG capsule Take 1 capsule (100 mg total) by mouth 2 (two) times daily. 01/17/20 10/13/20  Aldean Baker, NP    Physical Exam: Vitals:   07/22/23 0644 07/22/23 1705 07/22/23 2105 07/23/23 0619  BP: 115/77 136/79 (!) 120/92 107/68  Pulse: 65 68  68  Resp: 16 18  19   Temp: (!) 97.3 F (36.3 C) 98 F (36.7 C)  97.6 F (36.4 C)  TempSrc:    Oral  SpO2: 100% 100%  98%   Physical Exam Constitutional:      Appearance: She is normal weight.  HENT:     Head: Normocephalic and atraumatic.     Nose: Nose normal.     Mouth/Throat:     Mouth: Mucous membranes are moist.  Eyes:     Pupils: Pupils are equal, round, and reactive to light.  Cardiovascular:     Rate and Rhythm: Normal rate and regular rhythm.   Pulmonary:     Effort: Pulmonary effort is normal.  Abdominal:     General: Bowel sounds are normal.     Comments: + L sided flank TTP   Musculoskeletal:        General: Normal range of motion.     Cervical back: Normal range of motion.  Skin:    General: Skin is warm.  Neurological:     General: No focal deficit present.  Psychiatric:        Mood and Affect: Mood normal.     Data Reviewed:   There are no new results to review  at this time.    Family Communication: No family at the bedside  Primary team communication: Plan of care discussed w/ Darrick Grinder NP  Thank you very much for involving [place] in the care of your patient.  Author: Floydene Flock, MD 07/23/2023 9:28 AM  For on call review [email or web address].

## 2023-07-23 NOTE — Assessment & Plan Note (Signed)
Stable from a resp standpoint  Cont inhaler regimen

## 2023-07-23 NOTE — Progress Notes (Signed)
Pt calm and pleasant during assessment denying SI/HI/AVH. Pt observed interacting appropriately with staff and peers on the unit. Pt compliant with medication administration per MD orders. Pt given education, support, and encouragement to be active in her treatment plan. Pt being monitored Q 15 minutes for safety per unit protocol, remains safe on the unit

## 2023-07-23 NOTE — Progress Notes (Signed)
[facility] MD Progress Note  07/23/2023 1:32 PM Denise Knight  MRN:  [id number] Subjective:    Pt chart reviewed, discussed with interdisciplinary team, and seen on rounds. Reports mood is "good". Denies suicidal,homicidal ideations. Denies auditory visual hallucinations or paranoia. Reports appetite has been good. Sleep was poor last night due to left back pain over left kidney. States she feels it is swelling. Reports symptoms have been ongoing for 3 days now. States she didn't mention it before because it was not bad. States she has previously been told she had "kidney issues" although no medications or procedures had been recommended. Consulted to hospitalist, Dr. Alvester Morin, who will see pt today, appreciate assistance.  She has been accepted to [facility] for tomorrow and is looking forward to discharging there.  Principal Problem: Bipolar affective disorder, depressed, severe (HCC) Diagnosis: Principal Problem:   Bipolar affective disorder, depressed, severe (HCC) Active Problems:   NSTEMI (non-ST elevated myocardial infarction) (HCC)   Polysubstance abuse (HCC)   Asthma, chronic   Cocaine-induced mood disorder (HCC)   PTSD (post-traumatic stress disorder)   Flank pain, acute  Total Time spent with patient:  30 minutes  Past Psychiatric History: bipolar d/o, ptsd, substance abuse  Past Medical History:  Past Medical History:  Diagnosis Date   Allergy    seasonal   Anxiety    Arthritis    "left knee" (07/05/2016)   Asthma    Bipolar 1 disorder (HCC)    Depression    Diabetes mellitus without complication (HCC)    pre-   GERD (gastroesophageal reflux disease)    Hypertension    MI (mitral incompetence)    Post traumatic stress disorder    Schizophrenia (HCC)    Stomach ulcer    Substance abuse (HCC)    cocaine quit july 2020   Suicide attempt [facility])     Past Surgical History:  Procedure Laterality Date   CARDIAC CATHETERIZATION  07/05/2016   CARDIAC  CATHETERIZATION N/A 07/05/2016   Procedure: Left Heart Cath and Coronary Angiography;  Surgeon: Yates Decamp, MD;  Location: [facility] INVASIVE CV LAB;  Service: Cardiovascular;  Laterality: N/A;   CYST EXCISION Right    "wrist"   DILATION AND CURETTAGE OF UTERUS     FOOT SURGERY Bilateral    "corns removed"   LEFT HEART CATH AND CORONARY ANGIOGRAPHY N/A 12/24/2017   Procedure: LEFT HEART CATH AND CORONARY ANGIOGRAPHY;  Surgeon: Elder Negus, MD;  Location: [facility] INVASIVE CV LAB;  Service: Cardiovascular;  Laterality: N/A;   TUBAL LIGATION     Family History:  Family History  Problem Relation Age of Onset   Lung cancer Other    Congestive Heart Failure Other    Diabetes Other    Hypertension Other    Colon cancer Neg Hx    Colon polyps Neg Hx    Esophageal cancer Neg Hx    Stomach cancer Neg Hx    Rectal cancer Neg Hx    Family Psychiatric  History: none Social History:  Social History   Substance and Sexual Activity  Alcohol Use Yes   Alcohol/week: 7.0 standard drinks of alcohol   Types: 7 Cans of beer per week     Social History   Substance and Sexual Activity  Drug Use Yes   Types: Marijuana    Social History   Socioeconomic History   Marital status: Widowed    Spouse name: Not on file   Number of children: 2   Years of education:  Not on file   Highest education level: Not on file  Occupational History   Occupation: disabled   Tobacco Use   Smoking status: Some Days    Current packs/day: 0.00    Average packs/day: 0.3 packs/day for 35.0 years (8.8 ttl pk-yrs)    Types: Cigarettes    Start date: 05/29/1984    Last attempt to quit: 05/30/2019    Years since quitting: 4.1   Smokeless tobacco: Never  Vaping Use   Vaping status: Never Used  Substance and Sexual Activity   Alcohol use: Yes    Alcohol/week: 7.0 standard drinks of alcohol    Types: 7 Cans of beer per week   Drug use: Yes    Types: Marijuana   Sexual activity: Yes    Birth control/protection:  Surgical  Other Topics Concern   Not on file  Social History Narrative   Not on file   Social Determinants of Health   Financial Resource Strain: Not on File (02/28/2022)   Received from [facility], [place] Strain    Financial Resource Strain: 0  Recent Concern: Physicist, medical Strain - Medium Risk (02/11/2022)   Received from [facility], [facility]   Overall Financial Resource Strain (CARDIA)    Difficulty of Paying Living Expenses: Somewhat hard  Food Insecurity: No Food Insecurity (07/17/2023)   Hunger Vital Sign    Worried About Running Out of Food in the Last Year: Never true    Ran Out of Food in the Last Year: Never true  Transportation Needs: No Transportation Needs (07/17/2023)   PRAPARE - Administrator, Civil Service (Medical): No    Lack of Transportation (Non-Medical): No  Physical Activity: Not on File (02/28/2022)   Received from [place], [place]   Physical Activity    Physical Activity: 0  Stress: Not on File (02/28/2022)   Received from [facility], [place]   Stress    Stress: 0  Social Connections: Not on File (07/16/2023)   Received from [facility]   Social Connections    Connectedness: 0   Sleep: Poor  Appetite:  Good  Current Medications: Current Facility-Administered Medications  Medication Dose Route Frequency Provider Last Rate Last Admin   acetaminophen (TYLENOL) tablet 650 mg  650 mg Oral Q6H PRN Weber, Kyra A, NP   650 mg at 07/23/23 0831   albuterol (VENTOLIN HFA) 108 (90 Base) MCG/ACT inhaler 1-2 puff  1-2 puff Inhalation Q6H PRN Weber, Kyra A, NP   2 puff at 07/23/23 0824   alum & mag hydroxide-simeth (MAALOX/MYLANTA) 200-200-20 MG/5ML suspension 30 mL  30 mL Oral Q4H PRN Weber, Kyra A, NP       amLODipine (NORVASC) tablet 10 mg  10 mg Oral Daily Charm Rings, NP   10 mg at 07/23/23 4403   diphenhydrAMINE (BENADRYL) capsule 50 mg  50 mg Oral TID PRN Phebe Colla A, NP   50 mg at 07/20/23 2109   Or   diphenhydrAMINE (BENADRYL)  injection 50 mg  50 mg Intramuscular TID PRN Weber, Kyra A, NP       fluticasone (FLONASE) 50 MCG/ACT nasal spray 1 spray  1 spray Each Nare Daily Weber, Kyra A, NP   1 spray at 07/19/23 0914   folic acid (FOLVITE) tablet 1 mg  1 mg Oral Daily Weber, Kyra A, NP   1 mg at 07/23/23 4742   haloperidol (HALDOL) tablet 5 mg  5 mg Oral TID PRN Weber, Leavy Cella,  NP   5 mg at 07/20/23 2109   Or   haloperidol lactate (HALDOL) injection 5 mg  5 mg Intramuscular TID PRN Weber, Bella Kennedy A, NP       lurasidone (LATUDA) tablet 20 mg  20 mg Oral Q breakfast Lauree Chandler, NP   20 mg at 07/23/23 9528   magnesium hydroxide (MILK OF MAGNESIA) suspension 30 mL  30 mL Oral Daily PRN Phebe Colla A, NP       multivitamin with minerals tablet 1 tablet  1 tablet Oral Daily Weber, Kyra A, NP   1 tablet at 07/23/23 0823   naltrexone (DEPADE) tablet 50 mg  50 mg Oral Daily Charm Rings, NP   50 mg at 07/23/23 0958   ondansetron (ZOFRAN-ODT) disintegrating tablet 4 mg  4 mg Oral Q12H PRN Lauree Chandler, NP       pantoprazole (PROTONIX) EC tablet 40 mg  40 mg Oral Q breakfast Lauree Chandler, NP   40 mg at 07/23/23 4132   phenylephrine-shark liver oil-mineral oil-petrolatum (PREPARATION H) rectal ointment 1 Application  1 Application Rectal BID PRN Lauree Chandler, NP       prazosin (MINIPRESS) capsule 1 mg  1 mg Oral QHS Lauree Chandler, NP   1 mg at 07/22/23 2105   sodium chloride 0.9 % bolus 1,000 mL  1,000 mL Intravenous Once Floydene Flock, MD       thiamine (VITAMIN B1) tablet 100 mg  100 mg Oral Daily Weber, Kyra A, NP   100 mg at 07/23/23 0824   traZODone (DESYREL) tablet 100 mg  100 mg Oral QHS Weber, Kyra A, NP   100 mg at 07/22/23 2105    Lab Results:  Results for orders placed or performed during the hospital encounter of 07/16/23 (from the past 48 hour(s))  CBC with Differential/Platelet     Status: Abnormal   Collection Time: 07/23/23 10:36 AM  Result Value Ref Range   WBC 5.8 4.0 -  10.5 K/uL   RBC 4.48 3.87 - 5.11 MIL/uL   Hemoglobin 11.7 (L) 12.0 - 15.0 g/dL   HCT 44.0 10.2 - 72.5 %   MCV 83.9 80.0 - 100.0 fL   MCH 26.1 26.0 - 34.0 pg   MCHC 31.1 30.0 - 36.0 g/dL   RDW 36.6 (H) 44.0 - 34.7 %   Platelets 270 150 - 400 K/uL   nRBC 0.0 0.0 - 0.2 %   Neutrophils Relative % 54 %   Neutro Abs 3.1 1.7 - 7.7 K/uL   Lymphocytes Relative 32 %   Lymphs Abs 1.9 0.7 - 4.0 K/uL   Monocytes Relative 9 %   Monocytes Absolute 0.5 0.1 - 1.0 K/uL   Eosinophils Relative 4 %   Eosinophils Absolute 0.2 0.0 - 0.5 K/uL   Basophils Relative 1 %   Basophils Absolute 0.1 0.0 - 0.1 K/uL   Immature Granulocytes 0 %   Abs Immature Granulocytes 0.01 0.00 - 0.07 K/uL    Comment: Performed at [facility], [address] [place], [place] [address]  Comprehensive metabolic panel     Status: Abnormal   Collection Time: 07/23/23 10:36 AM  Result Value Ref Range   Sodium 139 135 - 145 mmol/L   Potassium 4.6 3.5 - 5.1 mmol/L   Chloride 106 98 - 111 mmol/L   CO2 26 22 - 32 mmol/L   Glucose, Bld 102 (H) 70 - 99 mg/dL    Comment: Glucose reference range  applies only to samples taken after fasting for at least 8 hours.   BUN 13 6 - 20 mg/dL   Creatinine, Ser 4.09 (H) 0.44 - 1.00 mg/dL   Calcium 9.3 8.9 - 81.1 mg/dL   Total Protein 6.4 (L) 6.5 - 8.1 g/dL   Albumin 3.4 (L) 3.5 - 5.0 g/dL   AST 19 15 - 41 U/L   ALT 16 0 - 44 U/L   Alkaline Phosphatase 49 38 - 126 U/L   Total Bilirubin 0.3 0.3 - 1.2 mg/dL   GFR, Estimated 54 (L) >60 mL/min    Comment: (NOTE) Calculated using the CKD-EPI Creatinine Equation (2021)    Anion gap 7 5 - 15    Comment: Performed at [facility], [address] [place], [place] [address]  Troponin I (High Sensitivity)     Status: None   Collection Time: 07/23/23 10:36 AM  Result Value Ref Range   Troponin I (High Sensitivity) 5 <18 ng/L    Comment: (NOTE) Elevated high sensitivity troponin I (hsTnI) values and significant  changes across  serial measurements may suggest ACS but many other  chronic and acute conditions are known to elevate hsTnI results.  Refer to the "Links" section for chest pain algorithms and additional  guidance. Performed at [facility], [address] [place], [place] [address]     Blood Alcohol level:  Lab Results  Component Value Date   [facility] <10 07/16/2023   ETH <10 01/12/2023    Metabolic Disorder Labs: Lab Results  Component Value Date   HGBA1C 5.8 (H) 07/17/2023   MPG 119.76 07/17/2023   MPG 116.89 09/03/2021   No results found for: "PROLACTIN" Lab Results  Component Value Date   CHOL 166 07/17/2023   TRIG 203 (H) 07/17/2023   HDL 33 (L) 07/17/2023   CHOLHDL 5.0 07/17/2023   VLDL 41 (H) 07/17/2023   LDLCALC 92 07/17/2023   LDLCALC 146 (H) 09/03/2021    Physical Findings: AIMS:  , ,  ,  ,    CIWA:  CIWA-Ar Total: 8 COWS:     Musculoskeletal: Strength & Muscle Tone: within normal limits Gait & Station: normal Patient leans: N/A  Psychiatric Specialty Exam:  Presentation  General Appearance:  Appropriate for Environment  Eye Contact: Fair  Speech: Normal Rate  Speech Volume: Normal  Handedness: Right   Mood and Affect  Mood: -- ("good")  Affect: Flat   Thought Process  Thought Processes: Coherent; Goal Directed; Linear  Descriptions of Associations:Intact  Orientation:Full (Time, Place and Person)  Thought Content:Logical  History of Schizophrenia/Schizoaffective disorder:No data recorded Duration of Psychotic Symptoms:No data recorded Hallucinations:Hallucinations: None  Ideas of Reference:None  Suicidal Thoughts:Suicidal Thoughts: No  Homicidal Thoughts:Homicidal Thoughts: No   Sensorium  Memory: Immediate Good; Recent Good; Remote Poor  Judgment: Intact  Insight: Present   Executive Functions  Concentration: Fair  Attention Span: Fair  Recall: Fair  Fund of  Knowledge: Good  Language: Good   Psychomotor Activity  Psychomotor Activity: Psychomotor Activity: Normal   Assets  Assets: Communication Skills; Desire for Improvement; Financial Resources/Insurance; Housing; Leisure Time; Resilience; Social Support   Sleep  Sleep: Sleep: Poor    Physical Exam: Physical Exam Constitutional:      General: She is not in acute distress.    Appearance: She is not ill-appearing, toxic-appearing or diaphoretic.  Eyes:     General: No scleral icterus. Cardiovascular:     Rate and Rhythm: Normal rate.  Pulmonary:  Effort: Pulmonary effort is normal. No respiratory distress.  Musculoskeletal:        General: Tenderness present.     Comments: Ttp over left lower back  Neurological:     Mental Status: She is alert and oriented to person, place, and time.  Psychiatric:        Attention and Perception: Attention and perception normal.        Mood and Affect: Mood normal. Affect is flat.        Speech: Speech normal.        Behavior: Behavior normal. Behavior is cooperative.        Thought Content: Thought content normal.        Cognition and Memory: Cognition and memory normal.        Judgment: Judgment normal.    Review of Systems  Constitutional:  Negative for chills and fever.  Respiratory:  Negative for shortness of breath.   Cardiovascular:  Negative for chest pain.  Gastrointestinal:  Negative for abdominal pain.  Musculoskeletal:  Positive for back pain.  Neurological:  Negative for headaches.  Psychiatric/Behavioral:  The patient has insomnia.    Blood pressure 107/68, pulse 68, temperature 97.6 F (36.4 C), temperature source Oral, resp. rate 19, SpO2 98%. There is no height or weight on file to calculate BMI.   Treatment Plan Summary: Daily contact with patient to assess and evaluate symptoms and progress in treatment, Medication management, and Plan    Bipolar 1 disorder -Latuda 20mg  oral daily with  breakfast Lipid panel with HDL 33 L, and triglycerides 203 H and VLDL 41 H A1C 5.8  TSH 1.141  Qtc 442   PTSD -Minipress 1mg  oral daily at bedtime   Alcohol withdrawal -CIWA with PRN ativan -Naltrexone 50 mg daily started Accepted to [facility] Social work to follow up with [facility]   Insomnia -Trazodone 100mg  oral daily at bedtime   GERD -Protonix 40mg  oral daily with breakfast   Nutritional supplements -Folvite 1mg  oral daily -Multivitamin with minerals 1 tablet oral daily -Thiamine 100mg  oral daily   Allergic rhinitis -Flonase 1 spray each nare, daily   PRNs -Tylenol 650mg  oral every 6 hours PRN mild pain -Ventolin HFA 1-2 puff, inhalation, every 6 hours PRN, wheezing, shortness of breath -Maalox/Mylanta 30mL oral every 4 hours PRN indigestion -Benadryl 50mg  oral or IM 3 times daily PRN agitation -Haldol 5mg  oral or IM 3 times daily PRN agitation -Ativan 2mg  oral or IM 3 times daily PRN agitation -Milk of Magnesia 30mL oral daily PRN -Zofran ODT 4mg  oral every 12 hours PRN nausea, vomiting -Preparation H 1 application rectal 2 times daily PRN hemorrhoids  Lauree Chandler, NP 07/23/2023, 1:32 PM

## 2023-07-23 NOTE — Assessment & Plan Note (Signed)
Management per primary team. °

## 2023-07-23 NOTE — Assessment & Plan Note (Signed)
Progressive left-sided flank pain over the past 3 to 4 days with noted prior history of kidney stones in the past Patient denies any active dysuria or increased urinary frequency Will check CT of the abdomen pelvis to better assess for now Urinalysis, CBC and CMP pending pending Pain control in the interim Will otherwise continue to monitor closely

## 2023-07-24 DIAGNOSIS — F314 Bipolar disorder, current episode depressed, severe, without psychotic features: Secondary | ICD-10-CM | POA: Diagnosis not present

## 2023-07-24 MED ORDER — VITAMIN B-1 100 MG PO TABS: 100.0000 mg | ORAL_TABLET | Freq: Every day | ORAL | 0 refills | Status: AC

## 2023-07-24 MED ORDER — AMLODIPINE BESYLATE 10 MG PO TABS: 10.0000 mg | ORAL_TABLET | Freq: Every day | ORAL | 0 refills | Status: AC

## 2023-07-24 MED ORDER — PRAZOSIN HCL 1 MG PO CAPS: 1.0000 mg | ORAL_CAPSULE | Freq: Every day | ORAL | 0 refills | Status: AC

## 2023-07-24 MED ORDER — PANTOPRAZOLE SODIUM 40 MG PO TBEC: 40.0000 mg | DELAYED_RELEASE_TABLET | Freq: Every day | ORAL | 0 refills | Status: AC

## 2023-07-24 MED ORDER — CEPHALEXIN 500 MG PO CAPS
500.0000 mg | ORAL_CAPSULE | Freq: Three times a day (TID) | ORAL | Status: DC
  Filled 2023-07-24: qty 1

## 2023-07-24 MED ORDER — FLUTICASONE PROPIONATE 50 MCG/ACT NA SUSP: 1.0000 | Freq: Every day | NASAL | 0 refills | Status: AC

## 2023-07-24 MED ORDER — ALBUTEROL SULFATE HFA 108 (90 BASE) MCG/ACT IN AERS
1.0000 | INHALATION_SPRAY | Freq: Four times a day (QID) | RESPIRATORY_TRACT | 0 refills | Status: AC | PRN
Start: 2023-07-24 — End: ?

## 2023-07-24 MED ORDER — NALTREXONE HCL 50 MG PO TABS: 50.0000 mg | ORAL_TABLET | Freq: Every day | ORAL | 0 refills | Status: AC

## 2023-07-24 MED ORDER — TRAZODONE HCL 100 MG PO TABS: 100.0000 mg | ORAL_TABLET | Freq: Every day | ORAL | 0 refills | Status: AC

## 2023-07-24 MED ORDER — LURASIDONE HCL 20 MG PO TABS: 20.0000 mg | ORAL_TABLET | Freq: Every day | ORAL | 0 refills | Status: AC

## 2023-07-24 MED ORDER — CEPHALEXIN 500 MG PO CAPS: 500.0000 mg | ORAL_CAPSULE | Freq: Three times a day (TID) | ORAL | 0 refills | Status: AC

## 2023-07-24 MED ORDER — FOLIC ACID 1 MG PO TABS: 1.0000 mg | ORAL_TABLET | Freq: Every day | ORAL | 0 refills | Status: AC

## 2023-07-24 MED ORDER — ADULT MULTIVITAMIN W/MINERALS CH: 1.0000 | ORAL_TABLET | Freq: Every day | ORAL | 0 refills | Status: AC
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
## 2023-07-24 NOTE — Plan of Care (Signed)
Pt scheduled to D/C today  Problem: Education: Goal: Knowledge of General Education information will improve Description: Including pain rating scale, medication(s)/side effects and non-pharmacologic comfort measures Outcome: Progressing   Problem: Health Behavior/Discharge Planning: Goal: Ability to manage health-related needs will improve Outcome: Progressing   Problem: Clinical Measurements: Goal: Ability to maintain clinical measurements within normal limits will improve Outcome: Progressing Goal: Will remain free from infection Outcome: Progressing Goal: Diagnostic test results will improve Outcome: Progressing Goal: Respiratory complications will improve Outcome: Progressing Goal: Cardiovascular complication will be avoided Outcome: Progressing   Problem: Activity: Goal: Risk for activity intolerance will decrease Outcome: Progressing   Problem: Nutrition: Goal: Adequate nutrition will be maintained Outcome: Progressing   Problem: Coping: Goal: Level of anxiety will decrease Outcome: Progressing   Problem: Elimination: Goal: Will not experience complications related to bowel motility Outcome: Progressing Goal: Will not experience complications related to urinary retention Outcome: Progressing   Problem: Pain Managment: Goal: General experience of comfort will improve Outcome: Progressing   Problem: Safety: Goal: Ability to remain free from injury will improve Outcome: Progressing   Problem: Skin Integrity: Goal: Risk for impaired skin integrity will decrease Outcome: Progressing   Problem: Education: Goal: Knowledge of [place] General Education information/materials will improve Outcome: Progressing Goal: Emotional status will improve Outcome: Progressing Goal: Mental status will improve Outcome: Progressing Goal: Verbalization of understanding the information provided will improve Outcome: Progressing   Problem: Activity: Goal: Interest or  engagement in activities will improve Outcome: Progressing Goal: Sleeping patterns will improve Outcome: Progressing   Problem: Coping: Goal: Ability to verbalize frustrations and anger appropriately will improve Outcome: Progressing Goal: Ability to demonstrate self-control will improve Outcome: Progressing   Problem: Health Behavior/Discharge Planning: Goal: Identification of resources available to assist in meeting health care needs will improve Outcome: Progressing Goal: Compliance with treatment plan for underlying cause of condition will improve Outcome: Progressing   Problem: Physical Regulation: Goal: Ability to maintain clinical measurements within normal limits will improve Outcome: Progressing   Problem: Safety: Goal: Periods of time without injury will increase Outcome: Progressing

## 2023-07-24 NOTE — Progress Notes (Signed)
UA positive for + leukocytes in setting of flank pain  Renal stone study grossly stable  Will start on keflex 500 mg TID x 7 days.

## 2023-07-24 NOTE — Group Note (Signed)
Recreation Therapy Group Note   Group Topic:Relaxation  Group Date: 07/24/2023 Start Time: 1000 End Time: 1050 Facilitators: Rosina Lowenstein, LRT, CTRS Location:  Craft Room  Group Description: PMR (Progressive Muscle Relaxation). LRT asks patients their current level of stress/anxiety from 1-10, with 10 being the highest. LRT educates patients on what PMR is and the benefits that come from it. Patients are asked to sit with their feet flat on the floor while sitting up and all the way back in their chair, if possible. LRT and pts follow a prompt through a speaker that requires you to tense and release different muscles in their body and focus on their breathing. During session, lights are off and soft music is being played. Pts are given a stress ball to use if needed. At the end of the prompt, LRT asks patients to rank their current levels of stress/anxiety from 1-10, 10 being the highest.   Goal Area(s) Addressed:  Patients will be able to describe progressive muscle relaxation.  Patient will practice using relaxation technique. Patient will identify a new coping skill.  Patient will follow multistep directions to reduce anxiety and stress.   Affect/Mood: Appropriate   Participation Level: Active and Engaged   Participation Quality: Independent   Behavior: Appropriate, Calm, and Cooperative   Speech/Thought Process: Coherent   Insight: Good   Judgement: Good   Modes of Intervention: Activity   Patient Response to Interventions:  Attentive, Engaged, Interested , and Receptive   Education Outcome:  Acknowledges education   Clinical Observations/Individualized Feedback: Jenel was active in their participation of session activities and group discussion. Pt identified that her stress and anxiety were both a 0 before and after the session. Pt interacted well with LRT and peers duration of session.   Plan: Continue to engage patient in RT group sessions 2-3x/week.   Rosina Lowenstein, LRT, CTRS 07/24/2023 11:05 AM

## 2023-07-24 NOTE — Plan of Care (Addendum)
Pt discharge to [facility] @1130  am, via [facility] staff. Pt pleasant, cooperative, expressed some anxiety about leaving and she denied having SI/HI, plan or intent to harm self or others.   Problem: Education: Goal: Knowledge of General Education information will improve Description: Including pain rating scale, medication(s)/side effects and non-pharmacologic comfort measures 07/24/2023 1009 by Jovita Gamma, RN Outcome: Adequate for Discharge 07/24/2023 1009 by Jovita Gamma, RN Outcome: Progressing   Problem: Health Behavior/Discharge Planning: Goal: Ability to manage health-related needs will improve Outcome: Adequate for Discharge   Problem: Clinical Measurements: Goal: Ability to maintain clinical measurements within normal limits will improve Outcome: Adequate for Discharge Goal: Will remain free from infection Outcome: Adequate for Discharge Goal: Diagnostic test results will improve Outcome: Adequate for Discharge Goal: Respiratory complications will improve Outcome: Adequate for Discharge Goal: Cardiovascular complication will be avoided Outcome: Adequate for Discharge   Problem: Activity: Goal: Risk for activity intolerance will decrease Outcome: Adequate for Discharge   Problem: Nutrition: Goal: Adequate nutrition will be maintained Outcome: Adequate for Discharge   Problem: Coping: Goal: Level of anxiety will decrease Outcome: Adequate for Discharge   Problem: Elimination: Goal: Will not experience complications related to bowel motility Outcome: Adequate for Discharge Goal: Will not experience complications related to urinary retention Outcome: Adequate for Discharge   Problem: Coping: Goal: Level of anxiety will decrease Outcome: Adequate for Discharge   Problem: Elimination: Goal: Will not experience complications related to bowel motility Outcome: Adequate for Discharge Goal: Will not experience complications related to urinary  retention Outcome: Adequate for Discharge   Problem: Elimination: Goal: Will not experience complications related to urinary retention Outcome: Adequate for Discharge   Problem: Pain Managment: Goal: General experience of comfort will improve Outcome: Adequate for Discharge   Problem: Safety: Goal: Ability to remain free from injury will improve Outcome: Adequate for Discharge   Problem: Skin Integrity: Goal: Risk for impaired skin integrity will decrease Outcome: Adequate for Discharge

## 2023-07-24 NOTE — Group Note (Signed)
Date:  07/24/2023 Time:  10:38 AM  Group Topic/Focus:  Activity Group:  Focus of the group was to encourage activities such as coloring, playing cards or just having conversations for the benefit of mental health treatment.    Participation Level:  Did Not Attend   Mary Sella Darcus Edds 07/24/2023, 10:38 AM

## 2023-07-24 NOTE — Progress Notes (Signed)
  [facility] Adult Case Management Discharge Plan :  Will you be returning to the same living situation after discharge:  No.  Patient has been accepted to [facility]. At discharge, do you have transportation home?: Yes,  CSW to assist with transportation needs.  [facility] will be picking patient up. Do you have the ability to pay for your medications:  Yes [place] MEDICAID PREPAID HEALTH PLAN / [employer] MEDICAID [facility]   Release of information consent forms completed and in the chart;  Patient's signature needed at discharge.  Patient to Follow up at:  Follow-up Information     [facility] Follow up.   Why: Bed is available 07/24/2023 Contact information: [address] [place] [address] [phone]                 Next level of care provider has access to [facility] Link:no  Safety Planning and Suicide Prevention discussed: Yes,  SPE completed with the patient.  Patient declined collateral contact.     Has patient been referred to the Quitline?: Patient refused referral for treatment  Patient has been referred for addiction treatment: Yes, the patient will follow up with a residential  provider for substance use disorder. Patient has been accepted by [facility].  Harden Mo, LCSW 07/24/2023, 10:49 AM

## 2023-07-24 NOTE — Discharge Summary (Signed)
Physician Discharge Summary Note  Patient:  Denise Knight is an 57 y.o., female MRN:  [id number] DOB:  1966/06/20 Patient phone:  [phone] (home)  Patient address:   [address] [place] [address],  Total Time spent with patient: 30 minutes  Date of Admission:  07/16/2023 Date of Discharge: 07/24/2023  Reason for Admission:   57 y/o female admitted to inpatient psychiatry as a transfer from [facility] for suicidal ideation and crack/cocaine abuse. Pt reports she was admitted because "I guess it was the drugs".   Principal Problem: Bipolar affective disorder, depressed, severe (HCC) Discharge Diagnoses: Principal Problem:   Bipolar affective disorder, depressed, severe (HCC) Active Problems:   NSTEMI (non-ST elevated myocardial infarction) (HCC)   Polysubstance abuse (HCC)   Asthma, chronic   Cocaine-induced mood disorder (HCC)   PTSD (post-traumatic stress disorder)   Flank pain, acute  Subjective: Pt chart reviewed, discussed with interdisciplinary team, and seen on rounds. Reports "good" mood today. Reports appetite and sleep have been "good". Denies suicidal, homicidal ideations. Denies auditory visual hallucinations or paranoia. Reports she has been hydrating with water and that flank pain has resolved. We discussed hospitalist recommendations for keflex 500mg  TID x7 days and UA +leukocytes. Pt verbalized understanding. Discussed for worsening symptoms to seek medical care. Pt verbalized understanding. She feels ready to discharge to [facility].   Past Psychiatric History: Pt with history of polysubstance abuse, ptsd, bipolar disorder, mdd  Past Medical History:  Past Medical History:  Diagnosis Date   Allergy    seasonal   Anxiety    Arthritis    "left knee" (07/05/2016)   Asthma    Bipolar 1 disorder (HCC)    Depression    Diabetes mellitus without complication (HCC)    pre-   GERD (gastroesophageal reflux disease)    Hypertension    MI  (mitral incompetence)    Post traumatic stress disorder    Schizophrenia (HCC)    Stomach ulcer    Substance abuse (HCC)    cocaine quit july 2020   Suicide attempt [facility])     Past Surgical History:  Procedure Laterality Date   CARDIAC CATHETERIZATION  07/05/2016   CARDIAC CATHETERIZATION N/A 07/05/2016   Procedure: Left Heart Cath and Coronary Angiography;  Surgeon: Yates Decamp, MD;  Location: [facility] INVASIVE CV LAB;  Service: Cardiovascular;  Laterality: N/A;   CYST EXCISION Right    "wrist"   DILATION AND CURETTAGE OF UTERUS     FOOT SURGERY Bilateral    "corns removed"   LEFT HEART CATH AND CORONARY ANGIOGRAPHY N/A 12/24/2017   Procedure: LEFT HEART CATH AND CORONARY ANGIOGRAPHY;  Surgeon: Elder Negus, MD;  Location: [facility] INVASIVE CV LAB;  Service: Cardiovascular;  Laterality: N/A;   TUBAL LIGATION     Family History:  Family History  Problem Relation Age of Onset   Lung cancer Other    Congestive Heart Failure Other    Diabetes Other    Hypertension Other    Colon cancer Neg Hx    Colon polyps Neg Hx    Esophageal cancer Neg Hx    Stomach cancer Neg Hx    Rectal cancer Neg Hx    Family Psychiatric  History: Reports second cousin completed suicide.Reports aunt was diagnosed with bipolar disorder.  Social History:  Social History   Substance and Sexual Activity  Alcohol Use Yes   Alcohol/week: 7.0 standard drinks of alcohol   Types: 7 Cans of beer per week  Social History   Substance and Sexual Activity  Drug Use Yes   Types: Marijuana    Social History   Socioeconomic History   Marital status: Widowed    Spouse name: Not on file   Number of children: 2   Years of education: Not on file   Highest education level: Not on file  Occupational History   Occupation: disabled   Tobacco Use   Smoking status: Some Days    Current packs/day: 0.00    Average packs/day: 0.3 packs/day for 35.0 years (8.8 ttl pk-yrs)    Types: Cigarettes    Start date:  05/29/1984    Last attempt to quit: 05/30/2019    Years since quitting: 4.1   Smokeless tobacco: Never  Vaping Use   Vaping status: Never Used  Substance and Sexual Activity   Alcohol use: Yes    Alcohol/week: 7.0 standard drinks of alcohol    Types: 7 Cans of beer per week   Drug use: Yes    Types: Marijuana   Sexual activity: Yes    Birth control/protection: Surgical  Other Topics Concern   Not on file  Social History Narrative   Not on file   Social Determinants of Health   Financial Resource Strain: Not on File (02/28/2022)   Received from [facility], [employer] Strain    Financial Resource Strain: 0  Recent Concern: Physicist, medical Strain - Medium Risk (02/11/2022)   Received from [facility]   Overall Financial Resource Strain (CARDIA)    Difficulty of Paying Living Expenses: Somewhat hard  Food Insecurity: No Food Insecurity (07/17/2023)   Hunger Vital Sign    Worried About Running Out of Food in the Last Year: Never true    Ran Out of Food in the Last Year: Never true  Transportation Needs: No Transportation Needs (07/17/2023)   PRAPARE - Administrator, Civil Service (Medical): No    Lack of Transportation (Non-Medical): No  Physical Activity: Not on File (02/28/2022)   Received from [place], [place]   Physical Activity    Physical Activity: 0  Stress: Not on File (02/28/2022)   Received from [facility], [place]   Stress    Stress: 0  Social Connections: Not on File (07/16/2023)   Received from [facility]   Social Connections    Connectedness: 0    Hospital Course:  Denise Knight was admitted for Bipolar affective disorder, depressed, severe (HCC) and crisis management. Denise Knight was discharged with current medication and was instructed on how to take medications as prescribed; (details listed below under Medication List).  Medical problems were identified and treated as needed.  Home medications were restarted as  appropriate.  Improvement was monitored by observation and Denise Knight daily report of symptom reduction.  Emotional and mental status was monitored by daily self-inventory reports completed by Denise Knight and clinical staff.         Berlyn Wesoloski was evaluated by the treatment team for stability and plans for continued recovery upon discharge.  Aminah Eisaman motivation was an integral factor for scheduling further treatment.  Employment, transportation, bed availability, health status, family support, and any pending legal issues were also considered during her hospital stay.  She was offered further treatment options upon discharge including but not limited to Residential, Intensive Outpatient, and Outpatient treatment.  Hasti Vaughns will follow up with the services as listed below under Follow Up Information.     Upon completion of  this admission the Kendalynn Kanaan was both mentally and medically stable for discharge denying suicidal/homicidal ideation, auditory/visual/tactile hallucinations, delusional thoughts and paranoia.     Physical Findings: AIMS:  , ,  ,  ,    CIWA:  CIWA-Ar Total: 8 COWS:     Musculoskeletal: Strength & Muscle Tone: within normal limits Gait & Station: normal Patient leans: N/A   Psychiatric Specialty Exam:  Presentation  General Appearance:  Appropriate for Environment  Eye Contact: Fair  Speech: Clear and Coherent; Normal Rate  Speech Volume: Normal  Handedness: Right   Mood and Affect  Mood: -- ("good")  Affect: Flat   Thought Process  Thought Processes: Coherent; Goal Directed; Linear  Descriptions of Associations:Intact  Orientation:Full (Time, Place and Person)  Thought Content:Logical  Hallucinations:Hallucinations: None  Ideas of Reference:None  Suicidal Thoughts:Suicidal Thoughts: No  Homicidal Thoughts:Homicidal Thoughts: No   Sensorium  Memory: Immediate Good; Recent Good; Remote  Good  Judgment: Intact  Insight: Present   Executive Functions  Concentration: Fair  Attention Span: Fair  Recall: Fair  Fund of Knowledge: Good  Language: Good   Psychomotor Activity  Psychomotor Activity: Psychomotor Activity: Normal   Assets  Assets: Communication Skills; Desire for Improvement; Financial Resources/Insurance; Housing; Leisure Time; Resilience; Social Support   Sleep  Sleep: Sleep: Good    Physical Exam: Physical Exam Constitutional:      General: She is not in acute distress.    Appearance: She is not ill-appearing, toxic-appearing or diaphoretic.  Eyes:     General: No scleral icterus. Cardiovascular:     Rate and Rhythm: Normal rate.  Pulmonary:     Effort: Pulmonary effort is normal. No respiratory distress.  Neurological:     Mental Status: She is alert and oriented to person, place, and time.  Psychiatric:        Attention and Perception: Attention and perception normal.        Mood and Affect: Mood normal. Affect is flat.        Speech: Speech normal.        Behavior: Behavior normal. Behavior is cooperative.        Thought Content: Thought content normal.        Cognition and Memory: Cognition and memory normal.        Judgment: Judgment normal.    Review of Systems  Constitutional:  Negative for chills and fever.  Respiratory:  Negative for shortness of breath.   Cardiovascular:  Negative for chest pain and palpitations.  Gastrointestinal:  Negative for abdominal pain.  Musculoskeletal:  Negative for back pain.  Neurological:  Negative for headaches.  Psychiatric/Behavioral: Negative.     Blood pressure 135/86, pulse 83, temperature 97.7 F (36.5 C), resp. rate 20, SpO2 99%. There is no height or weight on file to calculate BMI.   Social History   Tobacco Use  Smoking Status Some Days   Current packs/day: 0.00   Average packs/day: 0.3 packs/day for 35.0 years (8.8 ttl pk-yrs)   Types: Cigarettes   Start  date: 05/29/1984   Last attempt to quit: 05/30/2019   Years since quitting: 4.1  Smokeless Tobacco Never   Tobacco Cessation:  N/A, patient does not currently use tobacco products   Blood Alcohol level:  Lab Results  Component Value Date   [facility] <10 07/16/2023   ETH <10 01/12/2023    Metabolic Disorder Labs:  Lab Results  Component Value Date   HGBA1C 5.8 (H) 07/17/2023   MPG 119.76 07/17/2023  MPG 116.89 09/03/2021   No results found for: "PROLACTIN" Lab Results  Component Value Date   CHOL 166 07/17/2023   TRIG 203 (H) 07/17/2023   HDL 33 (L) 07/17/2023   CHOLHDL 5.0 07/17/2023   VLDL 41 (H) 07/17/2023   LDLCALC 92 07/17/2023   LDLCALC 146 (H) 09/03/2021    See Psychiatric Specialty Exam and Suicide Risk Assessment completed by Attending Physician prior to discharge.  Discharge destination:  Other:  [facility]  Is patient on multiple antipsychotic therapies at discharge:  No   Has Patient had three or more failed trials of antipsychotic monotherapy by history:  No  Recommended Plan for Multiple Antipsychotic Therapies: NA   Allergies as of 07/24/2023       Reactions   Morphine And Codeine Hives   Aspirin Hives   Food Hives   "Regular butter"   Lactose Hives   Butter is the only dairy product that she has a reaction to   Other Hives   Regular butter 05/30/2017 -   Tilactase    05/30/2017 -    BUTTER        Medication List     STOP taking these medications    melatonin 5 MG Tabs   metroNIDAZOLE 500 MG tablet Commonly known as: FLAGYL   nystatin cream Commonly known as: MYCOSTATIN       TAKE these medications      Indication  albuterol 108 (90 Base) MCG/ACT inhaler Commonly known as: VENTOLIN HFA Inhale 1-2 puffs into the lungs every 6 (six) hours as needed for wheezing or shortness of breath.  Indication: Asthma   amLODipine 10 MG tablet Commonly known as: NORVASC Take 1 tablet (10 mg total) by mouth daily. Start  taking on: July 25, 2023  Indication: High Blood Pressure Disorder   cephALEXin 500 MG capsule Commonly known as: KEFLEX Take 1 capsule (500 mg total) by mouth every 8 (eight) hours for 7 days.  Indication: Infection of Genitals and/or Urinary Tract   fluticasone 50 MCG/ACT nasal spray Commonly known as: FLONASE Place 1 spray into both nostrils daily.  Indication: Allergic Rhinitis   folic acid 1 MG tablet Commonly known as: FOLVITE Take 1 tablet (1 mg total) by mouth daily. Start taking on: July 25, 2023  Indication: nutritional supplement   lurasidone 20 MG Tabs tablet Commonly known as: LATUDA Take 1 tablet (20 mg total) by mouth daily with breakfast. Start taking on: July 25, 2023  Indication: Depressive Phase of Manic-Depression   multivitamin with minerals Tabs tablet Take 1 tablet by mouth daily. Start taking on: July 25, 2023  Indication: nutritional supplement   naltrexone 50 MG tablet Commonly known as: DEPADE Take 1 tablet (50 mg total) by mouth daily. Start taking on: July 25, 2023  Indication: Abuse or Misuse of Alcohol   pantoprazole 40 MG tablet Commonly known as: PROTONIX Take 1 tablet (40 mg total) by mouth daily with breakfast. Start taking on: July 25, 2023 What changed: when to take this  Indication: Heartburn   prazosin 1 MG capsule Commonly known as: MINIPRESS Take 1 capsule (1 mg total) by mouth at bedtime.  Indication: Frightening Dreams, Disturbed Sleep   thiamine 100 MG tablet Commonly known as: Vitamin B-1 Take 1 tablet (100 mg total) by mouth daily. Start taking on: July 25, 2023  Indication: nutritional supplement   traZODone 100 MG tablet Commonly known as: DESYREL Take 1 tablet (100 mg total) by mouth at bedtime.  Indication: Trouble  Sleeping        Follow-up Information     [facility] Follow up.   Contact information: [address] [place]  [address] [phone]         Services, [facility] Follow up.   Contact information: [address] [place] [address] [phone]                Follow-up recommendations:   Follow up with outpatient psychiatry and counseling  Signed: Lauree Chandler, NP 07/24/2023, 8:58 AM

## 2023-10-07 DIAGNOSIS — R079 Chest pain, unspecified: Secondary | ICD-10-CM | POA: Diagnosis not present

## 2023-10-07 DIAGNOSIS — R0602 Shortness of breath: Secondary | ICD-10-CM | POA: Diagnosis not present

## 2023-10-29 DIAGNOSIS — Z743 Need for continuous supervision: Secondary | ICD-10-CM | POA: Diagnosis not present

## 2023-10-29 DIAGNOSIS — M549 Dorsalgia, unspecified: Secondary | ICD-10-CM | POA: Diagnosis not present

## 2023-10-30 DIAGNOSIS — R109 Unspecified abdominal pain: Secondary | ICD-10-CM | POA: Diagnosis not present

## 2023-10-30 DIAGNOSIS — N390 Urinary tract infection, site not specified: Secondary | ICD-10-CM | POA: Diagnosis not present

## 2023-10-30 DIAGNOSIS — R0789 Other chest pain: Secondary | ICD-10-CM | POA: Diagnosis not present

## 2023-10-30 DIAGNOSIS — R062 Wheezing: Secondary | ICD-10-CM | POA: Diagnosis not present

## 2023-10-30 DIAGNOSIS — R0602 Shortness of breath: Secondary | ICD-10-CM | POA: Diagnosis not present

## 2023-11-04 DIAGNOSIS — Z0001 Encounter for general adult medical examination with abnormal findings: Secondary | ICD-10-CM | POA: Diagnosis not present

## 2024-01-29 DIAGNOSIS — R519 Headache, unspecified: Secondary | ICD-10-CM | POA: Diagnosis not present

## 2024-01-29 DIAGNOSIS — R1013 Epigastric pain: Secondary | ICD-10-CM | POA: Diagnosis not present

## 2024-01-29 DIAGNOSIS — I249 Acute ischemic heart disease, unspecified: Secondary | ICD-10-CM | POA: Diagnosis not present

## 2024-01-29 DIAGNOSIS — R109 Unspecified abdominal pain: Secondary | ICD-10-CM | POA: Diagnosis not present

## 2024-02-03 DIAGNOSIS — I808 Phlebitis and thrombophlebitis of other sites: Secondary | ICD-10-CM | POA: Diagnosis not present

## 2024-04-11 DIAGNOSIS — R079 Chest pain, unspecified: Secondary | ICD-10-CM | POA: Diagnosis not present

## 2024-05-25 DIAGNOSIS — R062 Wheezing: Secondary | ICD-10-CM | POA: Diagnosis not present

## 2024-05-25 DIAGNOSIS — J189 Pneumonia, unspecified organism: Secondary | ICD-10-CM | POA: Diagnosis not present

## 2024-05-25 DIAGNOSIS — J9621 Acute and chronic respiratory failure with hypoxia: Secondary | ICD-10-CM | POA: Diagnosis not present

## 2024-05-25 DIAGNOSIS — R0602 Shortness of breath: Secondary | ICD-10-CM | POA: Diagnosis not present

## 2024-05-25 DIAGNOSIS — R0689 Other abnormalities of breathing: Secondary | ICD-10-CM | POA: Diagnosis not present

## 2024-05-25 DIAGNOSIS — J45901 Unspecified asthma with (acute) exacerbation: Secondary | ICD-10-CM | POA: Diagnosis not present

## 2024-05-26 DIAGNOSIS — I82623 Acute embolism and thrombosis of deep veins of upper extremity, bilateral: Secondary | ICD-10-CM | POA: Diagnosis not present

## 2024-05-28 DIAGNOSIS — J9621 Acute and chronic respiratory failure with hypoxia: Secondary | ICD-10-CM | POA: Diagnosis not present

## 2024-05-29 DIAGNOSIS — R079 Chest pain, unspecified: Secondary | ICD-10-CM | POA: Diagnosis not present

## 2024-05-29 DIAGNOSIS — I3481 Nonrheumatic mitral (valve) annulus calcification: Secondary | ICD-10-CM | POA: Diagnosis not present

## 2024-05-29 DIAGNOSIS — I340 Nonrheumatic mitral (valve) insufficiency: Secondary | ICD-10-CM | POA: Diagnosis not present

## 2024-05-29 DIAGNOSIS — I361 Nonrheumatic tricuspid (valve) insufficiency: Secondary | ICD-10-CM | POA: Diagnosis not present

## 2024-05-29 DIAGNOSIS — I519 Heart disease, unspecified: Secondary | ICD-10-CM | POA: Diagnosis not present

## 2024-05-29 DIAGNOSIS — R0602 Shortness of breath: Secondary | ICD-10-CM | POA: Diagnosis not present

## 2024-05-30 DIAGNOSIS — R0602 Shortness of breath: Secondary | ICD-10-CM | POA: Diagnosis not present

## 2024-05-30 DIAGNOSIS — R059 Cough, unspecified: Secondary | ICD-10-CM | POA: Diagnosis not present

## 2024-06-01 DIAGNOSIS — J9621 Acute and chronic respiratory failure with hypoxia: Secondary | ICD-10-CM | POA: Diagnosis not present

## 2024-06-01 DIAGNOSIS — J189 Pneumonia, unspecified organism: Secondary | ICD-10-CM | POA: Diagnosis not present

## 2024-06-09 DIAGNOSIS — R0602 Shortness of breath: Secondary | ICD-10-CM | POA: Diagnosis not present

## 2024-06-15 DIAGNOSIS — J45909 Unspecified asthma, uncomplicated: Secondary | ICD-10-CM | POA: Diagnosis not present

## 2024-06-15 DIAGNOSIS — J439 Emphysema, unspecified: Secondary | ICD-10-CM | POA: Diagnosis not present

## 2024-06-15 DIAGNOSIS — J449 Chronic obstructive pulmonary disease, unspecified: Secondary | ICD-10-CM | POA: Diagnosis not present

## 2024-06-17 DIAGNOSIS — I10 Essential (primary) hypertension: Secondary | ICD-10-CM | POA: Diagnosis not present

## 2024-07-05 DIAGNOSIS — H1012 Acute atopic conjunctivitis, left eye: Secondary | ICD-10-CM | POA: Diagnosis not present

## 2024-08-04 DIAGNOSIS — Z043 Encounter for examination and observation following other accident: Secondary | ICD-10-CM | POA: Diagnosis not present

## 2024-08-04 DIAGNOSIS — M25511 Pain in right shoulder: Secondary | ICD-10-CM | POA: Diagnosis not present

## 2024-08-04 DIAGNOSIS — M25521 Pain in right elbow: Secondary | ICD-10-CM | POA: Diagnosis not present

## 2024-08-04 DIAGNOSIS — R569 Unspecified convulsions: Secondary | ICD-10-CM | POA: Diagnosis not present

## 2024-08-06 DIAGNOSIS — M549 Dorsalgia, unspecified: Secondary | ICD-10-CM | POA: Diagnosis not present

## 2024-08-06 DIAGNOSIS — M940 Chondrocostal junction syndrome [Tietze]: Secondary | ICD-10-CM | POA: Diagnosis not present

## 2024-09-24 DIAGNOSIS — R0602 Shortness of breath: Secondary | ICD-10-CM | POA: Diagnosis not present
